# Patient Record
Sex: Male | Born: 1955 | Race: Black or African American | Hispanic: No | Marital: Single | Smoking: Never smoker
Health system: Southern US, Community
[De-identification: ages and names within clinical notes are randomized; demographics above are authoritative.]

## PROBLEM LIST (undated history)

## (undated) DIAGNOSIS — J45909 Unspecified asthma, uncomplicated: Secondary | ICD-10-CM

## (undated) DIAGNOSIS — Z9119 Patient's noncompliance with other medical treatment and regimen: Secondary | ICD-10-CM

## (undated) DIAGNOSIS — I2510 Atherosclerotic heart disease of native coronary artery without angina pectoris: Secondary | ICD-10-CM

## (undated) DIAGNOSIS — I219 Acute myocardial infarction, unspecified: Secondary | ICD-10-CM

## (undated) DIAGNOSIS — C61 Malignant neoplasm of prostate: Secondary | ICD-10-CM

## (undated) DIAGNOSIS — I509 Heart failure, unspecified: Secondary | ICD-10-CM

## (undated) DIAGNOSIS — I428 Other cardiomyopathies: Secondary | ICD-10-CM

## (undated) DIAGNOSIS — I493 Ventricular premature depolarization: Secondary | ICD-10-CM

## (undated) DIAGNOSIS — I10 Essential (primary) hypertension: Secondary | ICD-10-CM

## (undated) DIAGNOSIS — E119 Type 2 diabetes mellitus without complications: Secondary | ICD-10-CM

## (undated) DIAGNOSIS — Z91199 Patient's noncompliance with other medical treatment and regimen due to unspecified reason: Secondary | ICD-10-CM

## (undated) DIAGNOSIS — E7800 Pure hypercholesterolemia, unspecified: Secondary | ICD-10-CM

## (undated) HISTORY — DX: Atherosclerotic heart disease of native coronary artery without angina pectoris: I25.10

## (undated) HISTORY — DX: Patient's noncompliance with other medical treatment and regimen due to unspecified reason: Z91.199

## (undated) HISTORY — PX: PROSTATE SURGERY: SHX751

## (undated) HISTORY — DX: Patient's noncompliance with other medical treatment and regimen: Z91.19

## (undated) HISTORY — DX: Heart failure, unspecified: I50.9

## (undated) HISTORY — DX: Other cardiomyopathies: I42.8

## (undated) HISTORY — DX: Pure hypercholesterolemia, unspecified: E78.00

## (undated) HISTORY — DX: Type 2 diabetes mellitus without complications: E11.9

## (undated) HISTORY — DX: Essential (primary) hypertension: I10

## (undated) HISTORY — PX: SURGERY SCROTAL / TESTICULAR: SUR1316

---

## 1997-06-16 ENCOUNTER — Encounter: Admission: RE | Admit: 1997-06-16 | Discharge: 1997-06-16 | Payer: Self-pay | Admitting: Internal Medicine

## 1999-04-30 ENCOUNTER — Encounter: Admission: RE | Admit: 1999-04-30 | Discharge: 1999-04-30 | Payer: Self-pay | Admitting: Internal Medicine

## 2000-07-14 ENCOUNTER — Encounter: Admission: RE | Admit: 2000-07-14 | Discharge: 2000-07-14 | Payer: Self-pay | Admitting: Internal Medicine

## 2000-07-16 ENCOUNTER — Encounter: Admission: RE | Admit: 2000-07-16 | Discharge: 2000-07-16 | Payer: Self-pay | Admitting: Internal Medicine

## 2000-07-20 ENCOUNTER — Encounter: Admission: RE | Admit: 2000-07-20 | Discharge: 2000-07-20 | Payer: Self-pay | Admitting: Internal Medicine

## 2000-07-21 ENCOUNTER — Encounter: Admission: RE | Admit: 2000-07-21 | Discharge: 2000-07-21 | Payer: Self-pay | Admitting: Internal Medicine

## 2000-08-23 ENCOUNTER — Observation Stay (HOSPITAL_COMMUNITY): Admission: EM | Admit: 2000-08-23 | Discharge: 2000-08-24 | Payer: Self-pay | Admitting: Emergency Medicine

## 2000-09-11 ENCOUNTER — Emergency Department (HOSPITAL_COMMUNITY): Admission: EM | Admit: 2000-09-11 | Discharge: 2000-09-11 | Payer: Self-pay | Admitting: Emergency Medicine

## 2000-09-11 ENCOUNTER — Encounter: Payer: Self-pay | Admitting: Emergency Medicine

## 2001-02-04 ENCOUNTER — Emergency Department (HOSPITAL_COMMUNITY): Admission: EM | Admit: 2001-02-04 | Discharge: 2001-02-04 | Payer: Self-pay | Admitting: Emergency Medicine

## 2001-07-05 ENCOUNTER — Emergency Department (HOSPITAL_COMMUNITY): Admission: EM | Admit: 2001-07-05 | Discharge: 2001-07-05 | Payer: Self-pay | Admitting: Emergency Medicine

## 2001-07-07 ENCOUNTER — Encounter: Admission: RE | Admit: 2001-07-07 | Discharge: 2001-07-07 | Payer: Self-pay | Admitting: Internal Medicine

## 2002-01-27 HISTORY — PX: CARDIAC CATHETERIZATION: SHX172

## 2002-08-26 ENCOUNTER — Encounter: Admission: RE | Admit: 2002-08-26 | Discharge: 2002-08-26 | Payer: Self-pay | Admitting: Internal Medicine

## 2002-08-31 ENCOUNTER — Encounter: Admission: RE | Admit: 2002-08-31 | Discharge: 2002-08-31 | Payer: Self-pay | Admitting: Internal Medicine

## 2002-09-01 ENCOUNTER — Encounter: Admission: RE | Admit: 2002-09-01 | Discharge: 2002-09-01 | Payer: Self-pay | Admitting: Internal Medicine

## 2003-01-13 ENCOUNTER — Inpatient Hospital Stay (HOSPITAL_BASED_OUTPATIENT_CLINIC_OR_DEPARTMENT_OTHER): Admission: RE | Admit: 2003-01-13 | Discharge: 2003-01-13 | Payer: Self-pay | Admitting: Cardiology

## 2003-08-14 ENCOUNTER — Emergency Department (HOSPITAL_COMMUNITY): Admission: EM | Admit: 2003-08-14 | Discharge: 2003-08-14 | Payer: Self-pay | Admitting: Emergency Medicine

## 2003-08-17 ENCOUNTER — Emergency Department (HOSPITAL_COMMUNITY): Admission: EM | Admit: 2003-08-17 | Discharge: 2003-08-17 | Payer: Self-pay | Admitting: Emergency Medicine

## 2004-01-10 ENCOUNTER — Emergency Department (HOSPITAL_COMMUNITY): Admission: EM | Admit: 2004-01-10 | Discharge: 2004-01-10 | Payer: Self-pay | Admitting: Emergency Medicine

## 2004-05-14 ENCOUNTER — Emergency Department (HOSPITAL_COMMUNITY): Admission: EM | Admit: 2004-05-14 | Discharge: 2004-05-14 | Payer: Self-pay | Admitting: *Deleted

## 2004-09-23 ENCOUNTER — Emergency Department (HOSPITAL_COMMUNITY): Admission: EM | Admit: 2004-09-23 | Discharge: 2004-09-23 | Payer: Self-pay | Admitting: Emergency Medicine

## 2004-11-05 ENCOUNTER — Ambulatory Visit: Payer: Self-pay

## 2004-11-20 ENCOUNTER — Ambulatory Visit: Payer: Self-pay | Admitting: Cardiology

## 2004-12-04 ENCOUNTER — Ambulatory Visit: Payer: Self-pay | Admitting: Cardiology

## 2005-01-13 ENCOUNTER — Ambulatory Visit: Payer: Self-pay | Admitting: Cardiology

## 2005-07-23 ENCOUNTER — Emergency Department (HOSPITAL_COMMUNITY): Admission: EM | Admit: 2005-07-23 | Discharge: 2005-07-23 | Payer: Self-pay | Admitting: Emergency Medicine

## 2005-08-26 ENCOUNTER — Ambulatory Visit: Payer: Self-pay | Admitting: Cardiology

## 2005-09-10 ENCOUNTER — Ambulatory Visit: Payer: Self-pay | Admitting: Cardiology

## 2005-10-22 ENCOUNTER — Ambulatory Visit: Payer: Self-pay | Admitting: Cardiology

## 2005-11-28 ENCOUNTER — Ambulatory Visit: Payer: Self-pay | Admitting: Cardiology

## 2006-01-01 ENCOUNTER — Ambulatory Visit: Payer: Self-pay | Admitting: Cardiology

## 2006-02-12 ENCOUNTER — Ambulatory Visit: Payer: Self-pay | Admitting: Cardiology

## 2006-02-24 ENCOUNTER — Ambulatory Visit: Payer: Self-pay | Admitting: Cardiology

## 2006-02-24 LAB — CONVERTED CEMR LAB
BUN: 13 mg/dL (ref 6–23)
CO2: 27 meq/L (ref 19–32)
Calcium: 9.2 mg/dL (ref 8.4–10.5)
Chloride: 109 meq/L (ref 96–112)
Creatinine, Ser: 1.5 mg/dL (ref 0.4–1.5)
GFR calc Af Amer: 63 mL/min
GFR calc non Af Amer: 52 mL/min
Glucose, Bld: 110 mg/dL — ABNORMAL HIGH (ref 70–99)
Potassium: 3.7 meq/L (ref 3.5–5.1)
Sodium: 140 meq/L (ref 135–145)

## 2006-03-16 ENCOUNTER — Ambulatory Visit: Payer: Self-pay | Admitting: Cardiology

## 2006-03-16 LAB — CONVERTED CEMR LAB
BUN: 13 mg/dL (ref 6–23)
CO2: 26 meq/L (ref 19–32)
Calcium: 9.3 mg/dL (ref 8.4–10.5)
Chloride: 108 meq/L (ref 96–112)
Creatinine, Ser: 1.4 mg/dL (ref 0.4–1.5)
GFR calc Af Amer: 69 mL/min
GFR calc non Af Amer: 57 mL/min
Glucose, Bld: 87 mg/dL (ref 70–99)
Potassium: 3.8 meq/L (ref 3.5–5.1)
Sodium: 140 meq/L (ref 135–145)

## 2006-08-24 ENCOUNTER — Ambulatory Visit: Payer: Self-pay | Admitting: Cardiology

## 2006-08-24 LAB — CONVERTED CEMR LAB
BUN: 18 mg/dL (ref 6–23)
CO2: 26 meq/L (ref 19–32)
Calcium: 9.4 mg/dL (ref 8.4–10.5)
Chloride: 106 meq/L (ref 96–112)
Creatinine, Ser: 1.6 mg/dL — ABNORMAL HIGH (ref 0.4–1.5)
GFR calc Af Amer: 59 mL/min
GFR calc non Af Amer: 48 mL/min
Glucose, Bld: 145 mg/dL — ABNORMAL HIGH (ref 70–99)
Potassium: 3.7 meq/L (ref 3.5–5.1)
Sodium: 141 meq/L (ref 135–145)

## 2006-09-24 ENCOUNTER — Ambulatory Visit: Payer: Self-pay | Admitting: Cardiology

## 2006-09-24 LAB — CONVERTED CEMR LAB
BUN: 15 mg/dL (ref 6–23)
CO2: 29 meq/L (ref 19–32)
Calcium: 9.7 mg/dL (ref 8.4–10.5)
Chloride: 108 meq/L (ref 96–112)
Creatinine, Ser: 1.5 mg/dL (ref 0.4–1.5)
GFR calc Af Amer: 63 mL/min
GFR calc non Af Amer: 52 mL/min
Glucose, Bld: 94 mg/dL (ref 70–99)
Potassium: 4 meq/L (ref 3.5–5.1)
Sodium: 143 meq/L (ref 135–145)

## 2007-09-20 ENCOUNTER — Inpatient Hospital Stay (HOSPITAL_COMMUNITY): Admission: EM | Admit: 2007-09-20 | Discharge: 2007-09-21 | Payer: Self-pay | Admitting: Emergency Medicine

## 2007-09-20 LAB — CONVERTED CEMR LAB
Cholesterol: 169 mg/dL
HDL: 25 mg/dL
Hgb A1c MFr Bld: 10.4 %
LDL Cholesterol: 104 mg/dL
TSH: 1.869 microintl units/mL
Triglyceride fasting, serum: 200 mg/dL

## 2007-09-28 ENCOUNTER — Ambulatory Visit: Payer: Self-pay | Admitting: Cardiology

## 2007-10-12 ENCOUNTER — Ambulatory Visit: Payer: Self-pay

## 2007-10-29 ENCOUNTER — Encounter: Payer: Self-pay | Admitting: Cardiology

## 2007-10-29 ENCOUNTER — Ambulatory Visit: Payer: Self-pay

## 2007-11-01 ENCOUNTER — Encounter: Admission: RE | Admit: 2007-11-01 | Discharge: 2007-11-01 | Payer: Self-pay | Admitting: Internal Medicine

## 2007-12-10 ENCOUNTER — Emergency Department (HOSPITAL_COMMUNITY): Admission: EM | Admit: 2007-12-10 | Discharge: 2007-12-10 | Payer: Self-pay | Admitting: Emergency Medicine

## 2008-03-31 ENCOUNTER — Ambulatory Visit: Payer: Self-pay | Admitting: Cardiology

## 2008-03-31 LAB — CONVERTED CEMR LAB
BUN: 20 mg/dL (ref 6–23)
CO2: 30 meq/L (ref 19–32)
Calcium: 9.5 mg/dL (ref 8.4–10.5)
Chloride: 106 meq/L (ref 96–112)
Creatinine, Ser: 1.3 mg/dL (ref 0.4–1.5)
GFR calc Af Amer: 74 mL/min
GFR calc non Af Amer: 61 mL/min
Glucose, Bld: 110 mg/dL — ABNORMAL HIGH (ref 70–99)
Potassium: 3.9 meq/L (ref 3.5–5.1)
Sodium: 141 meq/L (ref 135–145)

## 2009-02-02 ENCOUNTER — Encounter (INDEPENDENT_AMBULATORY_CARE_PROVIDER_SITE_OTHER): Payer: Self-pay | Admitting: *Deleted

## 2009-04-20 DIAGNOSIS — I10 Essential (primary) hypertension: Secondary | ICD-10-CM | POA: Insufficient documentation

## 2009-04-25 ENCOUNTER — Ambulatory Visit: Payer: Self-pay | Admitting: Internal Medicine

## 2009-04-25 ENCOUNTER — Ambulatory Visit: Payer: Self-pay | Admitting: Cardiology

## 2009-04-25 DIAGNOSIS — E1165 Type 2 diabetes mellitus with hyperglycemia: Secondary | ICD-10-CM | POA: Insufficient documentation

## 2009-04-25 DIAGNOSIS — E119 Type 2 diabetes mellitus without complications: Secondary | ICD-10-CM | POA: Insufficient documentation

## 2009-04-25 DIAGNOSIS — I428 Other cardiomyopathies: Secondary | ICD-10-CM | POA: Insufficient documentation

## 2009-04-25 LAB — CONVERTED CEMR LAB: Blood Glucose, AC Bkfst: 124 mg/dL

## 2009-04-25 LAB — HM DIABETES FOOT EXAM

## 2009-04-26 ENCOUNTER — Encounter: Payer: Self-pay | Admitting: Internal Medicine

## 2009-04-26 ENCOUNTER — Encounter (INDEPENDENT_AMBULATORY_CARE_PROVIDER_SITE_OTHER): Payer: Self-pay | Admitting: *Deleted

## 2009-04-26 ENCOUNTER — Telehealth: Payer: Self-pay | Admitting: Cardiology

## 2009-04-26 DIAGNOSIS — R972 Elevated prostate specific antigen [PSA]: Secondary | ICD-10-CM | POA: Insufficient documentation

## 2009-05-02 ENCOUNTER — Inpatient Hospital Stay (HOSPITAL_COMMUNITY): Admission: EM | Admit: 2009-05-02 | Discharge: 2009-05-03 | Payer: Self-pay | Admitting: Emergency Medicine

## 2009-05-02 ENCOUNTER — Ambulatory Visit: Payer: Self-pay | Admitting: Internal Medicine

## 2009-05-04 ENCOUNTER — Encounter (INDEPENDENT_AMBULATORY_CARE_PROVIDER_SITE_OTHER): Payer: Self-pay | Admitting: *Deleted

## 2009-05-08 ENCOUNTER — Telehealth (INDEPENDENT_AMBULATORY_CARE_PROVIDER_SITE_OTHER): Payer: Self-pay | Admitting: *Deleted

## 2009-05-09 ENCOUNTER — Ambulatory Visit: Payer: Self-pay

## 2009-05-09 ENCOUNTER — Ambulatory Visit: Payer: Self-pay | Admitting: Cardiovascular Disease

## 2009-05-09 ENCOUNTER — Ambulatory Visit: Payer: Self-pay | Admitting: Gastroenterology

## 2009-05-09 ENCOUNTER — Encounter (HOSPITAL_COMMUNITY): Admission: RE | Admit: 2009-05-09 | Discharge: 2009-06-27 | Payer: Self-pay | Admitting: Cardiology

## 2009-05-09 ENCOUNTER — Encounter (INDEPENDENT_AMBULATORY_CARE_PROVIDER_SITE_OTHER): Payer: Self-pay | Admitting: *Deleted

## 2009-05-23 ENCOUNTER — Ambulatory Visit: Payer: Self-pay | Admitting: Gastroenterology

## 2009-05-23 LAB — HM COLONOSCOPY

## 2009-05-25 ENCOUNTER — Encounter: Payer: Self-pay | Admitting: Gastroenterology

## 2009-06-01 ENCOUNTER — Encounter: Payer: Self-pay | Admitting: Cardiology

## 2009-07-25 ENCOUNTER — Ambulatory Visit: Payer: Self-pay | Admitting: Internal Medicine

## 2009-09-09 ENCOUNTER — Emergency Department (HOSPITAL_COMMUNITY): Admission: EM | Admit: 2009-09-09 | Discharge: 2009-09-09 | Payer: Self-pay | Admitting: Emergency Medicine

## 2009-09-09 ENCOUNTER — Encounter: Payer: Self-pay | Admitting: Cardiology

## 2009-09-10 ENCOUNTER — Telehealth: Payer: Self-pay | Admitting: Cardiology

## 2009-09-14 ENCOUNTER — Encounter: Payer: Self-pay | Admitting: Cardiology

## 2009-09-17 ENCOUNTER — Ambulatory Visit: Payer: Self-pay | Admitting: Cardiology

## 2009-09-17 DIAGNOSIS — I4949 Other premature depolarization: Secondary | ICD-10-CM | POA: Insufficient documentation

## 2009-09-17 DIAGNOSIS — E7800 Pure hypercholesterolemia, unspecified: Secondary | ICD-10-CM | POA: Insufficient documentation

## 2009-09-27 ENCOUNTER — Encounter: Payer: Self-pay | Admitting: Cardiology

## 2010-02-24 LAB — CONVERTED CEMR LAB
ALT: 26 units/L (ref 0–53)
AST: 24 units/L (ref 0–37)
Albumin: 3.9 g/dL (ref 3.5–5.2)
Alkaline Phosphatase: 65 units/L (ref 39–117)
BUN: 19 mg/dL (ref 6–23)
Basophils Absolute: 0.1 10*3/uL (ref 0.0–0.1)
Basophils Relative: 0.9 % (ref 0.0–3.0)
Bilirubin, Direct: 0 mg/dL (ref 0.0–0.3)
CO2: 27 meq/L (ref 19–32)
Calcium: 9.8 mg/dL (ref 8.4–10.5)
Chloride: 106 meq/L (ref 96–112)
Cholesterol: 210 mg/dL — ABNORMAL HIGH (ref 0–200)
Creatinine, Ser: 1.4 mg/dL (ref 0.4–1.5)
Direct LDL: 148.8 mg/dL
Eosinophils Absolute: 0.1 10*3/uL (ref 0.0–0.7)
Eosinophils Relative: 1.6 % (ref 0.0–5.0)
GFR calc non Af Amer: 67.67 mL/min (ref >60)
Glucose, Bld: 145 mg/dL — ABNORMAL HIGH (ref 70–99)
HCT: 42.4 % (ref 39.0–52.0)
HDL: 44.9 mg/dL (ref >39.00)
Hemoglobin: 13.9 g/dL (ref 13.0–17.0)
Hgb A1c MFr Bld: 7.5 % — ABNORMAL HIGH (ref 4.6–6.5)
Lymphocytes Relative: 33 % (ref 12.0–46.0)
Lymphs Abs: 2.4 10*3/uL (ref 0.7–4.0)
MCHC: 32.7 g/dL (ref 30.0–36.0)
MCV: 91.9 fL (ref 78.0–100.0)
Monocytes Absolute: 0.6 10*3/uL (ref 0.1–1.0)
Monocytes Relative: 8.1 % (ref 3.0–12.0)
Neutro Abs: 4.2 10*3/uL (ref 1.4–7.7)
Neutrophils Relative %: 56.4 % (ref 43.0–77.0)
PSA: 7.8 ng/mL — ABNORMAL HIGH (ref 0.10–4.00)
Platelets: 286 10*3/uL (ref 150.0–400.0)
Potassium: 4 meq/L (ref 3.5–5.1)
RBC: 4.61 M/uL (ref 4.22–5.81)
RDW: 12.6 % (ref 11.5–14.6)
Sodium: 141 meq/L (ref 135–145)
TSH: 0.82 microintl units/mL (ref 0.35–5.50)
Total Bilirubin: 0.5 mg/dL (ref 0.3–1.2)
Total CHOL/HDL Ratio: 5
Total Protein: 8 g/dL (ref 6.0–8.3)
Triglycerides: 91 mg/dL (ref 0.0–149.0)
VLDL: 18.2 mg/dL (ref 0.0–40.0)
WBC: 7.4 10*3/uL (ref 4.5–10.5)

## 2010-02-28 NOTE — Letter (Signed)
Summary: Patient Notice- Polyp Results  [facility]  [address] [place], [place] [address]   Phone: [phone]  Fax: [phone]        May 25, 2009 MRN: [id number]    IREN WHIPP [address] [place], [place]  [address]    Dear Mr. PRESLAR,  I am pleased to inform you that the colon polyp(s) removed during your recent colonoscopy was (were) found to be benign (no cancer detected) upon pathologic examination.  I recommend you have a repeat colonoscopy examination in 10_ years to look for recurrent polyps, as having colon polyps increases your risk for having recurrent polyps or even colon cancer in the future.  Should you develop new or worsening symptoms of abdominal pain, bowel habit changes or bleeding from the rectum or bowels, please schedule an evaluation with either your primary care physician or with me.  Additional information/recommendations:  xx__ No further action with gastroenterology is needed at this time. Please      follow-up with your primary care physician for your other healthcare      needs.  __ Please call [phone] to schedule a return visit to review your      situation.  __ Please keep your follow-up visit as already scheduled.  __ Continue treatment plan as outlined the day of your exam.  Please call us if you are having persistent problems or have questions about your condition that have not been fully answered at this time.  Sincerely,  Mardella Layman MD [facility]  This letter has been electronically signed by your physician.  Appended Document: Patient Notice- Polyp Results letter mailed 5.2.11

## 2010-02-28 NOTE — Miscellaneous (Signed)
Summary: abn PSA   Clinical Lists Changes  Problems: Added new problem of PSA, INCREASED (ICD-790.93) - Signed Orders: Added new Referral order of Urology Referral (Urology) - Signed  Valentina Gu, please let pt know that because of the abnormal PSA lab and +FHx prostate cancer, will refer to urology for further evaluation - [facility] will contact him once arranged - thanks! Newt Lukes MD  April 26, 2009 10:36 AM     called pt no ansew [facility] concerning labs.Marland KitchenMarland Kitchen3/31/11@10 :38am/LMB pt informed via secure VM per pt request. Margaret Pyle, CMA  April 27, 2009 9:40 AM

## 2010-02-28 NOTE — Miscellaneous (Signed)
  Clinical Lists Changes  Observations: Added new observation of NUCLEAR NOS: Exercise Capacity: Fair exercise capacity. BP Response: Normal blood pressure response. Clinical Symptoms: fatigue ECG Impression: Decrease in ambiant PVC;s with exercise Overall Impression: Small area of anterobasal ischemia.  EF disproportionately low compared to defect more suggestive of non-ischemic DCM   (05/09/2009 15:31)      Nuclear Study  Procedure date:  05/09/2009  Findings:      Exercise Capacity: Fair exercise capacity. BP Response: Normal blood pressure response. Clinical Symptoms: fatigue ECG Impression: Decrease in ambiant PVC;s with exercise Overall Impression: Small area of anterobasal ischemia.  EF disproportionately low compared to defect more suggestive of non-ischemic DCM

## 2010-02-28 NOTE — Procedures (Signed)
Summary: Colonoscopy  Patient: [name] Note: All result statuses are Final unless otherwise noted.  Tests: (1) Colonoscopy (COL)   COL Colonoscopy           DONE     [facility]     [address]     [place], [place]  [address]           COLONOSCOPY PROCEDURE REPORT           PATIENT:  [name], Human  MR#:  [id number]     BIRTHDATE:  [birth year], 54 yrs. old  GENDER:  male     ENDOSCOPIST:  Vania Rea. Jarold Motto, MD, [facility]     REF. BY:  Rene Paci, M.D.     PROCEDURE DATE:  05/23/2009     PROCEDURE:  Colonoscopy with snare polypectomy     ASA CLASS:  Class II     INDICATIONS:  Routine Risk Screening     MEDICATIONS:   Fentanyl 75 mcg IV, Versed 7 mg IV           DESCRIPTION OF PROCEDURE:   After the risks benefits and     alternatives of the procedure were thoroughly explained, informed     consent was obtained.  Digital rectal exam was performed and     revealed no abnormalities.   The LB [id number] endoscope     was introduced through the anus and advanced to the cecum, which     was identified by both the appendix and ileocecal valve, without     limitations.  The quality of the prep was excellent, using     MoviPrep.  The instrument was then slowly withdrawn as the colon     was fully examined.     <<PROCEDUREIMAGES>>           FINDINGS:  A diminutive polyp was found in the rectum.  This was     otherwise a normal examination of the colon.   Retroflexed views     in the rectum revealed no abnormalities.    The scope was then     withdrawn from the patient and the procedure completed.           COMPLICATIONS:  None     ENDOSCOPIC IMPRESSION:     1) Diminutive polyp in the rectum     2) Otherwise normal examination     R/O ADENOMA.     RECOMMENDATIONS:     1) Repeat colonoscopy in 5 years if polyp adenomatous; otherwise     10 years     REPEAT EXAM:  No           ______________________________     Vania Rea. Jarold Motto, MD, Clementeen Graham        CC:           n.     eSIGNED:   Vania Rea. Patterson at 05/23/2009 11:45 AM           Earlean Shawl, [id number]  Note: An exclamation mark (!) indicates a result that was not dispersed into the flowsheet. Document Creation Date: 05/23/2009 11:46 AM _______________________________________________________________________  (1) Order result status: Final Collection or observation date-time: 05/23/2009 11:29 Requested date-time:  Receipt date-time:  Reported date-time:  Referring Physician:   Ordering Physician: Sheryn Bison [phone]) Specimen Source:  Source: Launa Grill Order Number: [id number] Lab site:   Appended Document: Colonoscopy     Procedures Next Due Date:    Colonoscopy: 04/2019

## 2010-02-28 NOTE — Assessment & Plan Note (Signed)
Summary: Cardiology Nuclear Study  Nuclear Med Background Indications for Stress Test: Evaluation for Ischemia, Post Hospital  Indications Comments: 4/6-05/03/09 Chest Pain, (-) enzymes  History: Abnormal EKG, Asthma, COPD, Echo, Heart Catheterization, Myocardial Perfusion Study  History Comments: '05 Cath:n/o CAD; '09 Echo:EF=45%; 9/09 EAV:WUJWJ area of apical ischemia, not gated.  Symptoms: Chest Pain, DOE, Fatigue, Rapid HR  Symptoms Comments: Last episode of XB:JYNW week.   Nuclear Pre-Procedure Cardiac Risk Factors: Hypertension, NIDDM, Obesity Caffeine/Decaff Intake: None NPO After: 5:30 PM Lungs: Clear IV 0.9% NS with Angio Cath: 20g     IV Site: (R) AC IV Started by: Irean Hong RN Chest Size (in) 42     Height (in): 65 Weight (lb): 191 BMI: 31.90  Nuclear Med Study 1 or 2 day study:  1 day     Stress Test Type:  Stress Reading MD:  Charlton Haws, MD     Referring MD:  Rollene Rotunda, MD Resting Radionuclide:  Technetium 17m Tetrofosmin     Resting Radionuclide Dose:  11 mCi  Stress Radionuclide:  Technetium 75m Tetrofosmin     Stress Radionuclide Dose:  33 mCi   Stress Protocol Exercise Time (min):  8:45 min     Max HR:  142 bpm     Predicted Max HR:  165 bpm  Max Systolic BP: 168 mm Hg     Percent Max HR:  86.06 %     METS: 10.4 Rate Pressure Product:  29562    Stress Test Technologist:  Rea [employer] CMA-N     Nuclear Technologist:  Domenic Polite CNMT  Rest Procedure  Myocardial perfusion imaging was performed at rest 45 minutes following the intravenous administration of Myoview Technetium 80m Tetrofosmin.  Stress Procedure  The patient exercised for 8:45.  The patient stopped due to fatigue and denied any chest pain.  There were no diagnostic ST-T wave changes; there were frequent PVC's with bigeminy and trigeminy of which the patient was asymptomatic.  Myoview was injected at peak exercise and myocardial perfusion imaging was performed after a brief  delay.  QPS Raw Data Images:  Normal; no motion artifact; normal heart/lung ratio. Stress Images:  NI: Uniform and normal uptake of tracer in all myocardial segments. Rest Images:  Normal homogeneous uptake in all areas of the myocardium. Subtraction (SDS):  SDS 4 Transient Ischemic Dilatation:  1.03  (Normal <1.22)  Lung/Heart Ratio:  .31  (Normal <0.45)  Quantitative Gated Spect Images QGS EDV:  155 ml QGS ESV:  100 ml QGS EF:  36 % QGS cine images:  Diffuse hypokinesis  Findings Low risk nuclear study Evidence for anterior (septal apical) ischemia   Evidence for LV Dysfunction LV Dysfunction    Overall Impression  Exercise Capacity: Fair exercise capacity. BP Response: Normal blood pressure response. Clinical Symptoms: fatigue ECG Impression: Decrease in ambiant PVC;s with exercise Overall Impression: Small area of anterobasal ischemia.  EF disproportionately low compared to defect more suggestive of non-ischemic DCM  Appended Document: Cardiology Nuclear Study Low risk study.  Nonischemic CM  Appended Document: Cardiology Nuclear Study left message of results on voicemail

## 2010-02-28 NOTE — Assessment & Plan Note (Signed)
Summary: NEW PT/ BCBS/ DM/ NOT ON MEDICINE/DR HOCHREIN/NWS   Vital Signs:  Patient profile:   55 year old male Height:      65 inches (165.10 cm) Weight:      196.4 pounds (89.27 kg) O2 Sat:      97 % on Room air Temp:     98.2 degrees F (36.78 degrees C) oral Pulse rate:   38 / minute BP sitting:   148 / 70  (left arm) Cuff size:   large  Vitals Entered By: Orlan Leavens (April 25, 2009 10:48 AM)  O2 Flow:  Room air CC: New patient Is Patient Diabetic? No Pain Assessment Patient in pain? no        Primary Care Provider:  Newt Lukes MD  CC:  New patient.  History of Present Illness: new pt to me and our division, here to est care prev followed with dr. Mikeal Hawthorne - but has not taken any meds for last 6 mos -  1) DM2 - prev on pills and insulin - but no meds x 6 mos - denies abd pain, N/V or weight changes - denies polyuria or thirst - no blurred vision  2) HTN - as above, no meds >6 months - denies HA or CP - no feet swelling or vision changes -   3) CM with hx CHF - follows with cards for same (just seen this AM prompting this appt) - no edema, CP,SOB - follows no salt diet very carefully but noncompliant with med rx as above  Clinical Review Panels:  Immunizations   Last Tetanus Booster:  Tdap (04/25/2009)   Last Flu Vaccine:  Historical (10/27/2008)  Lipid Management   Cholesterol:  169 (09/20/2007)   LDL (bad choesterol):  104 (09/20/2007)   HDL (good cholesterol):  25 (09/20/2007)   Triglycerides:  200 (09/20/2007)  Diabetes Management   HgBA1C:  10.4 (09/20/2007)   Creatinine:  1.3 (03/31/2008)   Last Foot Exam:  yes (04/25/2009)   Last Flu Vaccine:  Historical (10/27/2008)  Complete Metabolic Panel   Glucose:  110 (03/31/2008)   Sodium:  141 (03/31/2008)   Potassium:  3.9 (03/31/2008)   Chloride:  106 (03/31/2008)   CO2:  30 (03/31/2008)   BUN:  20 (03/31/2008)   Creatinine:  1.3 (03/31/2008)   Calcium:  9.5 (03/31/2008)   -  Date:   09/20/2007    HgbA1c: 10.4    Cholesterol: 169    LDL: 104    HDL: 25    Triglycerides: 811    TSH: 1.869  Current Medications (verified): 1)  Spironolactone 25 Mg Tabs (Spironolactone) .... 2 By Mouth Daily 2)  Hydrochlorothiazide 12.5 Mg Caps (Hydrochlorothiazide) .[name].. 1 By Mouth Daiy 3)  Aspirin 81 Mg  Tabs (Aspirin) .[name].. 1 By Mouth Daily 4)  Benazepril Hcl 40 Mg Tabs (Benazepril Hcl) .... One Daily 5)  Amlodipine Besylate 5 Mg Tabs (Amlodipine Besylate) .... One Daily  Allergies (verified): No Known Drug Allergies  Past History:  Past Medical History: Hypertension.  Cardiomyopathy (EF approximately 45%).  Nonobstructive coronary artery disease (first diagonal 25%       stenosis, ramus intermedius 25% stenosis, this was on       catheterization in 2004).  DM2    MD rooster: cards - hochrein  Past Surgical History: Cardiac catheterization, which was normal per  the patient.      Family History: mom expired age 67y due to PVD (AKA), HTN brother - prostate ca  Social  History:  The patient denies smoking  rare use alcohol -wine 2-3x/yr widowed, then married -but separated now 23yo son on HD - to move in with pt    Review of Systems  The patient denies anorexia, fever, weight gain, vision loss, decreased hearing, hoarseness, chest pain, syncope, dyspnea on exertion, peripheral edema, headaches, abdominal pain, severe indigestion/heartburn, incontinence, muscle weakness, and depression.    Physical Exam  General:  Well developed, well nourished, in no acute distress. Neck:  supple, full ROM, no masses, no thyromegaly; no thyroid nodules or tenderness. no JVD or carotid bruits.   Lungs:  normal respiratory effort, no intercostal retractions or use of accessory muscles; normal breath sounds bilaterally - no crackles and no wheezes.    Heart:  normal rate, regular rhythm, no murmur, and no rub. BLE without edema.  Psych:  Oriented X3, memory intact for recent and  remote, normally interactive, good eye contact, not anxious appearing, not depressed appearing, and not agitated.     Diabetes Management Exam:    Foot Exam (with socks and/or shoes not present):       Sensory-Pinprick/Light touch:          Left medial foot (L-4): normal          Left dorsal foot (L-5): normal          Left lateral foot (S-1): normal          Right medial foot (L-4): normal          Right dorsal foot (L-5): normal          Right lateral foot (S-1): normal       Sensory-Monofilament:          Left foot: normal          Right foot: normal       Inspection:          Left foot: normal          Right foot: normal       Nails:          Left foot: normal          Right foot: normal   Impression & Recommendations:  Problem # 1:  DIABETES MELLITUS, TYPE II (ICD-250.00)  uncontrolled by hx ([facility] 08/2007) and noncompliant with meds -- but random glc <150 today fasting - await labs as drawn earlier this AM by cards - resume oral meds at reduced dose (compared to rx as per dc summary 08/2007) and hold insulin at this time - prev on lnatus but this med was cost prohibitive - consider 70/30 if needed education on diet and need to check cbgs to monitor tx effects -  f/u 3 mos to review, sooner if probs His updated medication list for this problem includes:    Aspirin 81 Mg Tabs (Aspirin) .[name]... 1 by mouth daily    Benazepril Hcl 40 Mg Tabs (Benazepril hcl) ..... One daily    Metformin Hcl 500 Mg Tabs (Metformin hcl) .[name]... 1 by mouth two times a day    Glimepiride 2 Mg Tabs (Glimepiride) .[name]... 1 by mouth  every morning  Labs Reviewed: Creat: 1.3 (03/31/2008)    Reviewed HgBA1c results: 10.4 (09/20/2007)  Orders: Prescription Created Electronically [id number])  Problem # 2:  HYPERTENSION (ICD-401.9)  His updated medication list for this problem includes:    Spironolactone 25 Mg Tabs (Spironolactone) .[name]... 2 by mouth daily    Hydrochlorothiazide 12.5 Mg Caps  (Hydrochlorothiazide) .[name]... 1 by  mouth daiy    Benazepril Hcl 40 Mg Tabs (Benazepril hcl) ..... One daily    Amlodipine Besylate 5 Mg Tabs (Amlodipine besylate) ..... One daily  BP today: 148/70 Prior BP: 162/98 (04/25/2009)  Labs Reviewed: K+: 3.9 (03/31/2008) Creat: : 1.3 (03/31/2008)   Chol: 169 (09/20/2007)   HDL: 25 (09/20/2007)   LDL: 104 (09/20/2007)   TG: 200 (09/20/2007)  Problem # 3:  CARDIOMYOPATHY (ICD-425.4)  The meds will be as above. I will repeat an echocardiogram in the months to come.  At this point he seems to have class I symptoms.  Problem # 4:  CAD (ICD-414.00)  as per cards earlier today - cont meds -- no symptoms presently His updated medication list for this problem includes:    Spironolactone 25 Mg Tabs (Spironolactone) .[name]... 2 by mouth daily    Hydrochlorothiazide 12.5 Mg Caps (Hydrochlorothiazide) .[name]... 1 by mouth daiy    Aspirin 81 Mg Tabs (Aspirin) .[name]... 1 by mouth daily    Benazepril Hcl 40 Mg Tabs (Benazepril hcl) ..... One daily    Amlodipine Besylate 5 Mg Tabs (Amlodipine besylate) ..... One daily  Labs Reviewed: Chol: 169 (09/20/2007)   HDL: 25 (09/20/2007)   LDL: 104 (09/20/2007)   TG: 200 (09/20/2007)  Complete Medication List: 1)  Spironolactone 25 Mg Tabs (Spironolactone) .... 2 by mouth daily 2)  Hydrochlorothiazide 12.5 Mg Caps (Hydrochlorothiazide) .[name].. 1 by mouth daiy 3)  Aspirin 81 Mg Tabs (Aspirin) .[name].. 1 by mouth daily 4)  Benazepril Hcl 40 Mg Tabs (Benazepril hcl) .... One daily 5)  Amlodipine Besylate 5 Mg Tabs (Amlodipine besylate) .... One daily 6)  Metformin Hcl 500 Mg Tabs (Metformin hcl) .[name].. 1 by mouth two times a day 7)  Glimepiride 2 Mg Tabs (Glimepiride) .[name].. 1 by mouth  every morning  Other Orders: Tdap => 42yrs IM (30160) Admin 1st Vaccine (10932) Glucose, (CBG) (35573) Gastroenterology Referral (GI)  Patient Instructions: 1)  it was good to see you today. 2)  will hold on insulin for now until your labs come back  -  3)  start pills for your diabetes - metformin and glimepride - 30d supply prescriptions have been electronically submitted to your pharmacy at [facility]. Please take as directed. Contact our office if you believe you're having problems with the medication(s).  4)  check you sugars every AM before breakfast and every PM before dinner - call if less than 60 or over 200 5)  reduce your fatty food intake to 2-3x/week and eat smaller portions of this food - avoid foods high in fructose, corn syrup and sugar - handout provided to read about diabetic diet 6)  Please schedule a follow-up appointment in 3 months to review, sooner if problems.  7)  we'll make referral for colonoscopy . Our office will contact you regarding this appointment once made.  Prescriptions: GLIMEPIRIDE 2 MG TABS (GLIMEPIRIDE) 1 by mouth  every morning  #30 x 5   Entered and Authorized by:   Newt Lukes MD   Signed by:   Newt Lukes MD on 04/25/2009   Method used:   Electronically to        [facility] [address] (retail)       [phone]       [place], [place]  [address]       Ph: [phone]       Fax: [phone]   RxID:   [id number] METFORMIN  HCL 500 MG TABS (METFORMIN HCL) 1 by mouth two times a day  #60 x 5   Entered and Authorized by:   Newt Lukes MD   Signed by:   Newt Lukes MD on 04/25/2009   Method used:   Electronically to        [facility] [address] (retail)       [phone]       [place], [place]  [address]       Ph: [phone]       Fax: [phone]   RxID:   [id number]    Immunization History:  Influenza Immunization History:    Influenza:  historical (10/27/2008)  Immunizations Administered:  Tetanus Vaccine:    Vaccine Type: Tdap    Site: left deltoid    Mfr: Gl[employer]oSmith[employer]    Dose: 0.5 ml    Route: IM    Given by: Orlan Leavens    Exp. Date: 11/22/2009    Lot #:  [id number]    VIS given: 04/25/09    Laboratory Results   Blood Tests     CBG Fasting:: 124mg /dL

## 2010-02-28 NOTE — Progress Notes (Signed)
Summary: lab results  Medications Added SIMVASTATIN 40 MG TABS (SIMVASTATIN) Take one tablet by mouth daily at bedtime       Phone Note Call from Patient Call back at Home Phone [phone]   Caller: Patient Reason for Call: Talk to Nurse, Lab or Test Results Summary of Call: request lab results Initial call taken by: Migdalia Dk,  April 26, 2009 4:57 PM  Follow-up for Phone Call        Spoke with pt. aware of  lab results and MD recommendations . An electronic prescription for Simvastatin 40 mg  by mouth daily send to [facility] pharmacy as requested per pt. Pt has an appointment for Fasting lipid panel and LFT  June 10th at [facility] lab, 10 weeks after  starting medication. Pt. aware. Ollen Gross, RN, BSN  April 27, 2009 9:13 AM     New/Updated Medications: SIMVASTATIN 40 MG TABS (SIMVASTATIN) Take one tablet by mouth daily at bedtime Prescriptions: SIMVASTATIN 40 MG TABS (SIMVASTATIN) Take one tablet by mouth daily at bedtime  #30 x 8   Entered by:   Ollen Gross, RN, BSN   Authorized by:   Rollene Rotunda, MD, [facility]   Signed by:   Ollen Gross, RN, BSN on 04/27/2009   Method used:   Electronically to        [facility] [address] (retail)       [phone]       [place], [place]  [address]       Ph: [phone]       Fax: [phone]   RxID:   [id number]

## 2010-02-28 NOTE — Letter (Signed)
Summary: Appointment - Reminder 2  [facility], Main Office  [address]   [place], [place] [address]   Phone: [phone]  Fax: [phone]     February 02, 2009 MRN: [id number]   [name] [address] [place], [place]  [address]   Dear [name],  Our records indicate that it is time to schedule a follow-up appointment. Dr.Hochrein recommended that you follow up with [place] in march,2011. It is very important that we reach you to schedule this appointment. We look forward to participating in your health care needs. Please contact us at the number listed above at your earliest convenience to schedule your appointment.  If you are unable to make an appointment at this time, give [place] a call so we can update our records.     Sincerely,   Glass blower/designer

## 2010-02-28 NOTE — Progress Notes (Signed)
Summary: Nuclear Pre-Procedure  Phone Note Outgoing Call Call back at [facility] Phone [phone]   Call placed by: Stanton Kidney, EMT-P,  May 08, 2009 2:31 PM Action Taken: Phone Call Completed Summary of Call: Left message with information on Myoview Information Sheet (see scanned document for details).     Nuclear Med Background Indications for Stress Test: Evaluation for Ischemia, Post Hospital  Indications Comments: 4/6-05/03/09 Chest Pain, (-) enzymes  History: Asthma, COPD, Echo, Heart Catheterization, Myocardial Perfusion Study  History Comments: '05 Heart Cath: N/O CAD '09 Echo: EF= 45% 9/09 MPS: small area of apical ischemia, not gated.  Symptoms: Chest Pain    Nuclear Pre-Procedure Cardiac Risk Factors: Hypertension, NIDDM, Obesity Height (in): 65

## 2010-02-28 NOTE — Letter (Signed)
Summary: [facility] Instructions  [facility]  [address] [place], [place] [address]   Phone: [phone]  Fax: [phone]       ALLYN BERTONI    08-29-1955    MRN: [id number]        Procedure Day Dorna Bloom:  [facility]  05/23/09     Arrival Time:  10:00AM     Procedure Time:  11:00AM     Location of Procedure:                    _X _  [facility] (4th Floor)                        PREPARATION FOR COLONOSCOPY WITH MOVIPREP   Starting 5 days prior to your procedure 05/18/09 do not eat nuts, seeds, popcorn, corn, beans, peas,  salads, or any raw vegetables.  Do not take any fiber supplements (e.g. Metamucil, Citrucel, and Benefiber).  THE DAY BEFORE YOUR PROCEDURE         DATE: 05/22/09  DAY: TUESDAY  1.  Drink clear liquids the entire day-NO SOLID FOOD  2.  Do not drink anything colored red or purple.  Avoid juices with pulp.  No orange juice.  3.  Drink at least 64 oz. (8 glasses) of fluid/clear liquids during the day to prevent dehydration and help the prep work efficiently.  CLEAR LIQUIDS INCLUDE: Water Jello Ice Popsicles Tea (sugar ok, no milk/cream) Powdered fruit flavored drinks Coffee (sugar ok, no milk/cream) Gatorade Juice: apple, white grape, white cranberry  Lemonade Clear bullion, consomm, broth Carbonated beverages (any kind) Strained chicken noodle soup Hard Candy                             4.  In the morning, mix first dose of MoviPrep solution:    Empty 1 Pouch A and 1 Pouch B into the disposable container    Add lukewarm drinking water to the top line of the container. Mix to dissolve    Refrigerate (mixed solution should be used within 24 hrs)  5.  Begin drinking the prep at 5:00 p.m. The MoviPrep container is divided by 4 marks.   Every 15 minutes drink the solution down to the next mark (approximately 8 oz) until the full liter is complete.   6.  Follow completed prep with 16 oz of clear liquid of your choice  (Nothing red or purple).  Continue to drink clear liquids until bedtime.  7.  Before going to bed, mix second dose of MoviPrep solution:    Empty 1 Pouch A and 1 Pouch B into the disposable container    Add lukewarm drinking water to the top line of the container. Mix to dissolve    Refrigerate  THE DAY OF YOUR PROCEDURE      DATE: 05/23/09  DAY: WEDNESDAY  Beginning at 6:00AM (5 hours before procedure):         1. Every 15 minutes, drink the solution down to the next mark (approx 8 oz) until the full liter is complete.  2. Follow completed prep with 16 oz. of clear liquid of your choice.    3. You may drink clear liquids until 9:00AM (2 HOURS BEFORE PROCEDURE).   MEDICATION INSTRUCTIONS  Unless otherwise instructed, you should take regular prescription medications with a small sip of water   as early as possible the morning  of your procedure.  Diabetic patients - see separate instructions.   Additional medication instructions: Take only your Benazepril and Amlodipine the morning of procedure.         OTHER INSTRUCTIONS  You will need a responsible adult at least 56 years of age to accompany you and drive you home.   This person must remain in the waiting room during your procedure.  Wear loose fitting clothing that is easily removed.  Leave jewelry and other valuables at home.  However, you may wish to bring a book to read or  an iPod/MP3 player to listen to music as you wait for your procedure to start.  Remove all body piercing jewelry and leave at home.  Total time from sign-in until discharge is approximately 2-3 hours.  You should go home directly after your procedure and rest.  You can resume normal activities the  day after your procedure.  The day of your procedure you should not:   Drive   Make legal decisions   Operate machinery   Drink alcohol   Return to work  You will receive specific instructions about eating, activities and medications  before you leave.    The above instructions have been reviewed and explained to me by   _______________________    I fully understand and can verbalize these instructions _____________________________ Date _________

## 2010-02-28 NOTE — Miscellaneous (Signed)
Summary: viagra 50  RX  Clinical Lists Changes  Medications: Added new medication of VIAGRA 50 MG TABS (SILDENAFIL CITRATE) one by mouth one hour prior to sexual activity - Signed Rx of VIAGRA 50 MG TABS (SILDENAFIL CITRATE) one by mouth one hour prior to sexual activity;  #20 x 3;  Signed;  Entered by: Charolotte Capuchin, RN;  Authorized by: Rollene Rotunda, MD, [facility];  Method used: Electronically to [facility] [address] [phone] [place], [place], [place]  [address], Ph: [phone], Fax: [phone]    Prescriptions: VIAGRA 50 MG TABS (SILDENAFIL CITRATE) one by mouth one hour prior to sexual activity  #20 x 3   Entered by:   Charolotte Capuchin, RN   Authorized by:   Rollene Rotunda, MD, [facility]   Signed by:   Charolotte Capuchin, RN on 09/17/2009   Method used:   Electronically to        [facility] [address] (retail)       [phone]       [place], [place]  [address]       Ph: [phone]       Fax: [phone]   RxID:   [id number]

## 2010-02-28 NOTE — Letter (Signed)
Summary: Diabetic Instructions  [facility]  [address] [place], [place] [address]   Phone: [phone]  Fax: [phone]    [name] [birth year] MRN: [id number]   _X  _   ORAL DIABETIC MEDICATION INSTRUCTIONS  The day before your procedure:   Take your diabetic pill as you do normally  The day of your procedure:   Do not take your diabetic pill    We will check your blood sugar levels during the admission process and again in Recovery before discharging you home  ________________________________________________________________________

## 2010-02-28 NOTE — Letter (Signed)
Summary: [facility] [facility] Certification Notification  [employer] [facility] Certification Notification   Imported By: Roderic Ovens 06/14/2009 13:18:19  _____________________________________________________________________  External Attachment:    Type:   Image     Comment:   External Document

## 2010-02-28 NOTE — Letter (Signed)
Summary: [facility], Main Office  [address]   [place], [place] [address]   Phone: [phone]  Fax: [phone]         September 27, 2009    [name]     The above named patient had a medical visit on September 17, 2009  Please take this into consideration when reviewing the time away from work/school.        Sincerely yours,      Sander Nephew, RN for Dr. Rollene Rotunda [facility]

## 2010-02-28 NOTE — Miscellaneous (Signed)
Summary: [facility] Previsit/prep  Clinical Lists Changes  Medications: Added new medication of MOVIPREP 100 GM  SOLR (PEG-KCL-NACL-NASULF-NA ASC-C) As per prep instructions. - Signed Rx of MOVIPREP 100 GM  SOLR (PEG-KCL-NACL-NASULF-NA ASC-C) As per prep instructions.;  #1 x 0;  Signed;  Entered by: Wyona Almas RN;  Authorized by: Mardella Layman MD [facility];  Method used: Electronically to [facility] [address] [phone] [place], [place], [place]  [address], Ph: [phone], Fax: [phone] Observations: Added new observation of NKA: T (05/09/2009 13:54)    Prescriptions: MOVIPREP 100 GM  SOLR (PEG-KCL-NACL-NASULF-NA ASC-C) As per prep instructions.  #1 x 0   Entered by:   Wyona Almas RN   Authorized by:   Mardella Layman MD [facility]   Signed by:   Wyona Almas RN on 05/09/2009   Method used:   Electronically to        [facility] [address] (retail)       [phone]       [place], [place]  [address]       Ph: [phone]       Fax: [phone]   RxID:   [id number]

## 2010-02-28 NOTE — Miscellaneous (Signed)
  Clinical Lists Changes  Observations: Added new observation of NUCLEAR NOS: Findings  Low risk nuclear study Evidence for anterior (septal apical) ischemia   Evidence for LV Dysfunction LV Dysfunction    Overall Impression   Exercise Capacity: Fair exercise capacity. BP Response: Normal blood pressure response. Clinical Symptoms: fatigue ECG Impression: Decrease in ambiant PVC;s with exercise Overall Impression: Small area of anterobasal ischemia.  EF disproportionately low compared to defect more suggestive of non-ischemic DCM  (05/09/2009 14:23)      Nuclear Study  Procedure date:  05/09/2009  Findings:      Findings  Low risk nuclear study Evidence for anterior (septal apical) ischemia   Evidence for LV Dysfunction LV Dysfunction    Overall Impression   Exercise Capacity: Fair exercise capacity. BP Response: Normal blood pressure response. Clinical Symptoms: fatigue ECG Impression: Decrease in ambiant PVC;s with exercise Overall Impression: Small area of anterobasal ischemia.  EF disproportionately low compared to defect more suggestive of non-ischemic DCM

## 2010-02-28 NOTE — Progress Notes (Signed)
Summary: pls advise-after hr vm- hypetention, bigeminy    Phone Note Call from Patient Call back at [facility] Phone [phone]   Caller: Patient Reason for Call: Talk to Nurse Summary of Call: Please Advise, Per after hour voice mail - Matt S.  need appt for Hypertention, bigeminy but asymptomatic. Dr. Antoine Poche is off this week.  Initial call taken by: Lorne Skeens,  September 10, 2009 9:18 AM  Follow-up for Phone Call        schedule the pt to see the pa tomorrow, if willing. if only wants to see Deundra Furber, schedule next available and he will need to see his primary now. looks like seen for hypertension but was out of his meds. Deliah Goody, RN  September 10, 2009 9:53 AM   Additional Follow-up for Phone Call Additional follow up Details #1::        pt has appt 09/17/2009 Additional Follow-up by: Charolotte Capuchin, RN,  September 14, 2009 5:14 PM

## 2010-02-28 NOTE — Assessment & Plan Note (Signed)
Summary: eph/ gd  Medications Added LIPITOR 20 MG TABS (ATORVASTATIN CALCIUM) one daily      Allergies Added: NKDA  Visit Type:  Follow-up Primary Caedence Snowden:  Newt Lukes MD  CC:  Hypertension/cardiomyopathy.  History of Present Illness: The patient presents for followup of the above. Since I last saw him he did have one episode of hypertension and palpitations and called our on-call person to discuss. However, it turns out he had run out of his medications. He says he's otherwise been doing well. He doesn't exercise but he walks a lot at work. With this he does not notice any shortness of breath, PND or orthopnea. He has no chest pressure, neck or arm discomfort. He has no palpitations, presyncope or syncope. He doesn't notice his PVCs in a bigeminal pattern. He doesn't take his blood pressure as routinely as I would like. He has not had any leg edema or swelling elsewhere. Note after the last appointment I did start him on simvastatin. He has not had his followup lipids as he was told to.  Current Medications (verified): 1)  Spironolactone 25 Mg Tabs (Spironolactone) .... 2 By Mouth Daily 2)  Hydrochlorothiazide 12.5 Mg Caps (Hydrochlorothiazide) .[name].. 1 By Mouth Daiy 3)  Aspirin 81 Mg  Tabs (Aspirin) .[name].. 1 By Mouth Daily 4)  Benazepril Hcl 40 Mg Tabs (Benazepril Hcl) .... One Daily 5)  Amlodipine Besylate 5 Mg Tabs (Amlodipine Besylate) .... One Daily 6)  Metformin Hcl 500 Mg Tabs (Metformin Hcl) .[name].. 1 By Mouth Two Times A Day 7)  Glimepiride 2 Mg Tabs (Glimepiride) .[name].. 1 By Mouth  Every Morning 8)  Simvastatin 40 Mg Tabs (Simvastatin) .... Take One Tablet By Mouth Daily At Bedtime  Allergies (verified): No Known Drug Allergies  Past History:  Past Medical History: Hypertension.  Cardiomyopathy (EF approximately 45%).  Nonobstructive coronary artery disease (first diagonal 25%       stenosis, ramus intermedius 25% stenosis, this was on       catheterization in 2004).    DM2    MD roster: cards - hochrein  Review of Systems       As stated in the HPI and negative for all other systems.   Vital Signs:  Patient profile:   55 year old male Height:      65 inches Weight:      189 pounds BMI:     31.56 Pulse rate:   48 / minute Resp:     16 per minute BP sitting:   142 / 82  (right arm)  Vitals Entered By: Marrion Coy, CNA (September 17, 2009 9:26 AM)  Physical Exam  General:  Well developed, well nourished, in no acute distress. Head:  normocephalic and atraumatic Neck:  Neck supple, no JVD. No masses, thyromegaly or abnormal cervical nodes. Chest Wall:  no deformities or breast masses noted Lungs:  Clear bilaterally to auscultation and percussion. Abdomen:  Bowel sounds positive; abdomen soft and non-tender without masses, organomegaly, or hernias noted. No hepatosplenomegaly. Msk:  Back normal, normal gait. Muscle strength and tone normal. Extremities:  No clubbing or cyanosis. Neurologic:  Alert and oriented x 3. Skin:  Intact without lesions or rashes. Cervical Nodes:  no significant adenopathy Psych:  Oriented X3, memory intact for recent and remote, normally interactive, good eye contact, not anxious appearing, not depressed appearing, and not agitated.      Detailed Cardiovascular Exam  Neck    Carotids: Carotids full and equal bilaterally without bruits.  Neck Veins: Normal, no JVD.    Heart    Inspection: no deformities or lifts noted.      Palpation: normal PMI with no thrills palpable.      Auscultation: regular rate and rhythm, S1, S2 soft systolic murmur radiating slightly at the aortic outflow tract, rubs, gallops, or clicks.    Vascular    Abdominal Aorta: no palpable masses, pulsations, or audible bruits.      Femoral Pulses: normal femoral pulses bilaterally.      Pedal Pulses: normal pedal pulses bilaterally.      Radial Pulses: normal radial pulses bilaterally.      Peripheral Circulation: no clubbing,  cyanosis, or edema noted with normal capillary refill.     EKG  Procedure date:  09/09/2009  Findings:      Sinus rhythm, left axis deviation, interventricular conduction delay, left ventricular hypertrophy by voltage criteria, poor anterior R-wave progression, PVCs in a bigeminal pattern  Impression & Recommendations:  Problem # 1:  HYPERTENSION (ICD-401.9) His blood pressure seems to be reasonably controlled if he takes his medication. I emphasized the need not to run out of these. I also instructed him to get a blood pressure cuff to keep his blood pressures at home or to have them done routinely at work so that we can understand whether he needs further meds titration. Continues to need therapeutic lifestyle changes.  Note I will check a basic metabolic profile today since he is on Aldactone. Orders: TLB-BMP (Basic Metabolic Panel-BMET) (80048-METABOL)  Problem # 2:  HYPERCHOLESTEROLEMIA (ICD-272.0) I will need to change his simvastatin to Lipitor 20 mg daily since he is on Norvasc. I will check a lipid profile today. Orders: TLB-Hepatic/Liver Function Pnl (80076-HEPATIC) TLB-Lipid Panel (80061-LIPID)  Problem # 3:  CARDIOMYOPATHY (ICD-425.4) He is asymptomatic. Management will be through titration of his blood pressure meds. Orders: TLB-BMP (Basic Metabolic Panel-BMET) (80048-METABOL)  Problem # 4:  PREMATURE VENTRICULAR CONTRACTIONS (ICD-427.69) He is not symptomatic with these. No change in therapy is indicated.  Patient Instructions: 1)  Your physician recommends that you schedule a follow-up appointment in: 6 months with Dr Antoine Poche 2)  Your physician recommends that you have lab work today   lipid,liver and BNP 3)  Your physician has recommended you make the following change in your medication: Stop simvastatin and start Lipitor 20mg  a day Prescriptions: LIPITOR 20 MG TABS (ATORVASTATIN CALCIUM) one daily  #30 x 11   Entered by:   Charolotte Capuchin, RN   Authorized  by:   Rollene Rotunda, MD, [facility]   Signed by:   Charolotte Capuchin, RN on 09/17/2009   Method used:   Electronically to        [facility] [address] (retail)       [phone]       [place], [place]  [address]       Ph: [phone]       Fax: [phone]   RxID:   [id number]  I have reviewed and approved all prescriptions at the time of this visit. Rollene Rotunda, MD, [facility]  September 17, 2009 10:00 AM

## 2010-02-28 NOTE — Assessment & Plan Note (Signed)
Summary: yearly/sl  Medications Added HYDROCHLOROTHIAZIDE 12.5 MG CAPS (HYDROCHLOROTHIAZIDE) 1 by mouth daiy ASPIRIN 81 MG  TABS (ASPIRIN) 1 by mouth daily BENAZEPRIL HCL 40 MG TABS (BENAZEPRIL HCL) one daily AMLODIPINE BESYLATE 5 MG TABS (AMLODIPINE BESYLATE) one daily      Allergies Added: NKDA  Visit Type:  Follow-up Primary Provider:  None  CC:  HTN and Cardiomyopathy.  History of Present Illness: The patient presents for followup of his hypertension and cardiomyopathy. In the past year he's had no acute cardiac complaints. However, for some reason he has stopped 2 of his antihypertensives and all of his diabetes medicines. He says he is no longer seeing his prior primary care doctor. He is not sure what happened to his other medications and doesn't recall what he was taking in the past. He thinks his blood pressures been okay when checked outside of doctors appointments. However, it's not clear how reliable this is. He denies any shortness of breath, PND or orthopnea. He is not reporting palpitations, presyncope or syncope. He does have some left axillary discomfort occasionally. However, this is sporadic. He is able to do such things as play basketball which he did recently without bringing on any of these symptoms. He has had no swelling or edema.  Current Medications (verified): 1)  Spironolactone 25 Mg Tabs (Spironolactone) .... 2 By Mouth Daily 2)  Hydrochlorothiazide 12.5 Mg Caps (Hydrochlorothiazide) .[name].. 1 By Mouth Daiy 3)  Aspirin 81 Mg  Tabs (Aspirin) .[name].. 1 By Mouth Daily  Allergies (verified): No Known Drug Allergies  Past History:  Past Medical History:  1. Hypertension.   2. Cardiomyopathy (EF approximately 45%).   3. Nonobstructive coronary artery disease (first diagonal 25%       stenosis, ramus intermedius 25% stenosis, this was on       catheterization in 2004).      Review of Systems       As stated in the HPI and negative for all other  systems.   Vital Signs:  Patient profile:   55 year old male Height:      65 inches Weight:      196 pounds BMI:     32.73 Pulse rate:   80 / minute Resp:     18 per minute BP sitting:   162 / 98  (right arm)  Vitals Entered By: Marrion Coy, CNA (April 25, 2009 8:54 AM)  Physical Exam  General:  Well developed, well nourished, in no acute distress. Head:  normocephalic and atraumatic Eyes:  PERRLA/EOM intact; conjunctiva and lids normal. Mouth:  Poor dentition, gums and palate normal. Oral mucosa normal. Neck:  Neck supple, no JVD. No masses, thyromegaly or abnormal cervical nodes. Chest Wall:  no deformities or breast masses noted Lungs:  Clear bilaterally to auscultation and percussion. Abdomen:  Bowel sounds positive; abdomen soft and non-tender without masses, organomegaly, or hernias noted. No hepatosplenomegaly. Msk:  Back normal, normal gait. Muscle strength and tone normal. Extremities:  No clubbing or cyanosis. Neurologic:  Alert and oriented x 3. Skin:  Intact without lesions or rashes. Cervical Nodes:  no significant adenopathy Axillary Nodes:  no significant adenopathy Inguinal Nodes:  no significant adenopathy Psych:  Normal affect.   Detailed Cardiovascular Exam  Neck    Carotids: Carotids full and equal bilaterally without bruits.      Neck Veins: Normal, no JVD.    Heart    Inspection: no deformities or lifts noted.      Palpation: normal PMI  with no thrills palpable.      Auscultation: regular rate and rhythm, S1, S2 without murmurs, rubs, gallops, or clicks.    Vascular    Abdominal Aorta: no palpable masses, pulsations, or audible bruits.      Femoral Pulses: normal femoral pulses bilaterally.      Pedal Pulses: normal pedal pulses bilaterally.      Radial Pulses: normal radial pulses bilaterally.      Peripheral Circulation: no clubbing, cyanosis, or edema noted with normal capillary refill.     EKG  Procedure date:   04/25/2009  Findings:      sinus rhythm, left ventricular hypertrophy by voltage criteria, PVCs in a bigeminal pattern, no acute ST-T wave changes  Impression & Recommendations:  Problem # 1:  HYPERTENSION (ICD-401.9) His blood pressure is elevated but he's been off of many of his medicines. I will restart enalapril 40 mg daily. I will start his Norvasc back at a lower dose of 5 mg. I will then titrate meds as needed. Orders: EKG w/ Interpretation (93000) Primary Care Referral (Primary) TLB-CBC Platelet - w/Differential (85025-CBCD) TLB-BMP (Basic Metabolic Panel-BMET) (80048-METABOL) TLB-Hepatic/Liver Function Pnl (80076-HEPATIC) TLB-A1C / Hgb A1C (Glycohemoglobin) (83036-A1C) TLB-TSH (Thyroid Stimulating Hormone) (84443-TSH) TLB-Lipid Panel (80061-LIPID)  Problem # 2:  CARDIOMYOPATHY (ICD-425.4) The meds will be as above. I will repeat an echocardiogram in the months to come.  At this point he seems to have class I symptoms.  Problem # 3:  DM (ICD-250.00) The patient requests a new primary care physician. He has not seen his in quite a while. I will take the liberty of ordering all of the screening labs below. Of note he has not had prostate or colon screening as far as he can recall. I will defer this as well as management of his diabetes to Dr. [name]. Orders: Primary Care Referral (Primary) TLB-CBC Platelet - w/Differential (85025-CBCD) TLB-BMP (Basic Metabolic Panel-BMET) (80048-METABOL) TLB-Hepatic/Liver Function Pnl (80076-HEPATIC) TLB-A1C / Hgb A1C (Glycohemoglobin) (83036-A1C) TLB-TSH (Thyroid Stimulating Hormone) (84443-TSH) TLB-Lipid Panel (80061-LIPID) TLB-PSA (Prostate Specific Antigen) (84153-PSA)  Problem # 4:  CAD (ICD-414.00) No further cardiovascular testing is indicated.  He needs risk reduction. Orders: EKG w/ Interpretation (93000) TLB-CBC Platelet - w/Differential (85025-CBCD) TLB-BMP (Basic Metabolic Panel-BMET) (80048-METABOL) TLB-Hepatic/Liver  Function Pnl (80076-HEPATIC) TLB-A1C / Hgb A1C (Glycohemoglobin) (83036-A1C) TLB-TSH (Thyroid Stimulating Hormone) (84443-TSH) TLB-Lipid Panel (80061-LIPID) TLB-PSA (Prostate Specific Antigen) (84153-PSA)  Patient Instructions: 1)  Your physician recommends that you schedule a follow-up appointment as needed 2)  Your physician recommends that you have  lab work today: lipid/liver/TSH/CBC/PSA/HA1C 401.1 428.22 250.00 3)  You have been referred to Rene Paci 4)  You have been diagnosed with Congestive Heart Failure or CHF.  CHF is a condition in which a problem with the structure or function of the heart impairs its ability to supply sufficient blood flow to meet the body's needs.  For further information please visit www.cardiosmart.org for detailed information on CHF. 5)  Your physician recommends that you weigh, daily, at the same time every day, and in the same amount of clothing.  Please record your daily weights on the handout provided and bring it to your next appointment. 6)  Your physician has recommended you make the following change in your medication: Start benazapril 40 mg a daily and Amlodipine 5 mg daily Prescriptions: AMLODIPINE BESYLATE 5 MG TABS (AMLODIPINE BESYLATE) one daily  #30 x 11   Entered by:   Charolotte Capuchin, RN   Authorized by:   Rollene Rotunda, MD,  FACC   Signed by:   Charolotte Capuchin, RN on 04/25/2009   Method used:   Electronically to        [facility] [address] (retail)       [phone]       [place], [place]  [address]       Ph: [phone]       Fax: [phone]   RxID:   [id number] BENAZEPRIL HCL 40 MG TABS (BENAZEPRIL HCL) one daily  #30 x 11   Entered by:   Charolotte Capuchin, RN   Authorized by:   Rollene Rotunda, MD, [facility]   Signed by:   Charolotte Capuchin, RN on 04/25/2009   Method used:   Electronically to        [facility] [address] (retail)       [phone]        [place], [place]  [address]       Ph: [phone]       Fax: [phone]   RxID:   [id number]

## 2010-02-28 NOTE — Miscellaneous (Signed)
Summary: refill HCTZ  Clinical Lists Changes  Medications: Rx of HYDROCHLOROTHIAZIDE 12.5 MG CAPS (HYDROCHLOROTHIAZIDE) 1 by mouth daiy;  #30 x 11;  Signed;  Entered by: Charolotte Capuchin, RN;  Authorized by: Rollene Rotunda, MD, [facility];  Method used: Electronically to [facility] [phone] [place], [place], [place]  [address], Ph: [phone], Fax: [phone]    Prescriptions: HYDROCHLOROTHIAZIDE 12.5 MG CAPS (HYDROCHLOROTHIAZIDE) 1 by mouth daiy  #30 x 11   Entered by:   Charolotte Capuchin, RN   Authorized by:   Rollene Rotunda, MD, [facility]   Signed by:   Charolotte Capuchin, RN on 04/25/2009   Method used:   Electronically to        [facility] [address] (retail)       [phone]       [place], [place]  [address]       Ph: [phone]       Fax: [phone]   RxID:   [id number]

## 2010-02-28 NOTE — Letter (Signed)
Summary: Previsit letter  [facility]  [address] [place], [place] [address]   Phone: [phone]  Fax: [phone]       04/26/2009 MRN: [id number]  [name] [address] [name], [place]  [address]  Dear [name],  Welcome to the Gastroenterology Division at [facility].    You are scheduled to see a nurse for your pre-procedure visit on 05-09-09 at 10:00a.m. on the 3rd floor at [facility], [address]  We ask that you try to arrive at our office 15 minutes prior to your appointment time to allow for check-in.  Your nurse visit will consist of discussing your medical and surgical history, your immediate family medical history, and your medications.    Please bring a complete list of all your medications or, if you prefer, bring the medication bottles and we will list them.  We will need to be aware of both prescribed and over the counter drugs.  We will need to know exact dosage information as well.  If you are on blood thinners (Coumadin, Plavix, Aggrenox, Ticlid, etc.) please call our office today/prior to your appointment, as we need to consult with your physician about holding your medication.   Please be prepared to read and sign documents such as consent forms, a financial agreement, and acknowledgement forms.  If necessary, and with your consent, a friend or relative is welcome to sit-in on the nurse visit with you.  Please bring your insurance card so that we may make a copy of it.  If your insurance requires a referral to see a specialist, please bring your referral form from your primary care physician.  No co-pay is required for this nurse visit.     If you cannot keep your appointment, please call [phone] to cancel or reschedule prior to your appointment date.  This allows [place] the opportunity to schedule an appointment for another patient in need of care.    Thank you for choosing [facility] for your medical  needs.  We appreciate the opportunity to care for you.  Please visit [place] at our website  to learn more about our practice.                     Sincerely.                                                                                                                   The Gastroenterology Division

## 2010-03-11 ENCOUNTER — Emergency Department (HOSPITAL_COMMUNITY)
Admission: EM | Admit: 2010-03-11 | Discharge: 2010-03-12 | Disposition: A | Discharge status: Home / Self Care | Payer: BC Managed Care – PPO | Attending: Emergency Medicine | Admitting: Emergency Medicine

## 2010-03-11 DIAGNOSIS — R112 Nausea with vomiting, unspecified: Secondary | ICD-10-CM | POA: Insufficient documentation

## 2010-03-11 DIAGNOSIS — R197 Diarrhea, unspecified: Secondary | ICD-10-CM | POA: Insufficient documentation

## 2010-03-11 DIAGNOSIS — I10 Essential (primary) hypertension: Secondary | ICD-10-CM | POA: Insufficient documentation

## 2010-03-11 DIAGNOSIS — E119 Type 2 diabetes mellitus without complications: Secondary | ICD-10-CM | POA: Insufficient documentation

## 2010-03-11 DIAGNOSIS — B9789 Other viral agents as the cause of diseases classified elsewhere: Secondary | ICD-10-CM | POA: Insufficient documentation

## 2010-03-11 LAB — DIFFERENTIAL
Basophils Absolute: 0.1 10*3/uL (ref 0.0–0.1)
Basophils Relative: 1 % (ref 0–1)
Eosinophils Absolute: 0.1 10*3/uL (ref 0.0–0.7)
Eosinophils Relative: 1 % (ref 0–5)
Lymphocytes Relative: 40 % (ref 12–46)
Lymphs Abs: 3.1 10*3/uL (ref 0.7–4.0)
Monocytes Absolute: 0.7 10*3/uL (ref 0.1–1.0)
Monocytes Relative: 9 % (ref 3–12)
Neutro Abs: 3.8 10*3/uL (ref 1.7–7.7)
Neutrophils Relative %: 50 % (ref 43–77)

## 2010-03-11 LAB — CBC
HCT: 37.9 % — ABNORMAL LOW (ref 39.0–52.0)
Hemoglobin: 13.2 g/dL (ref 13.0–17.0)
MCH: 30.4 pg (ref 26.0–34.0)
MCHC: 34.8 g/dL (ref 30.0–36.0)
MCV: 87.3 fL (ref 78.0–100.0)
Platelets: 298 10*3/uL (ref 150–400)
RBC: 4.34 MIL/uL (ref 4.22–5.81)
RDW: 12.2 % (ref 11.5–15.5)
WBC: 7.8 10*3/uL (ref 4.0–10.5)

## 2010-03-11 LAB — BASIC METABOLIC PANEL
BUN: 17 mg/dL (ref 6–23)
CO2: 28 mEq/L (ref 19–32)
Calcium: 9.5 mg/dL (ref 8.4–10.5)
Chloride: 103 mEq/L (ref 96–112)
Creatinine, Ser: 1.28 mg/dL (ref 0.4–1.5)
GFR calc Af Amer: >60 mL/min (ref >60)
GFR calc non Af Amer: 58 mL/min — ABNORMAL LOW (ref >60)
Glucose, Bld: 286 mg/dL — ABNORMAL HIGH (ref 70–99)
Potassium: 3.8 mEq/L (ref 3.5–5.1)
Sodium: 137 mEq/L (ref 135–145)

## 2010-04-12 LAB — DIFFERENTIAL
Basophils Absolute: 0.2 10*3/uL — ABNORMAL HIGH (ref 0.0–0.1)
Basophils Relative: 2 % — ABNORMAL HIGH (ref 0–1)
Eosinophils Absolute: 0.2 10*3/uL (ref 0.0–0.7)
Eosinophils Relative: 2 % (ref 0–5)
Lymphocytes Relative: 34 % (ref 12–46)
Lymphs Abs: 2.8 10*3/uL (ref 0.7–4.0)
Monocytes Absolute: 0.7 10*3/uL (ref 0.1–1.0)
Monocytes Relative: 9 % (ref 3–12)
Neutro Abs: 4.2 10*3/uL (ref 1.7–7.7)
Neutrophils Relative %: 52 % (ref 43–77)

## 2010-04-12 LAB — POCT I-STAT, CHEM 8
BUN: 11 mg/dL (ref 6–23)
Calcium, Ion: 1.16 mmol/L (ref 1.12–1.32)
Chloride: 108 mEq/L (ref 96–112)
Creatinine, Ser: 1.4 mg/dL (ref 0.4–1.5)
Glucose, Bld: 105 mg/dL — ABNORMAL HIGH (ref 70–99)
HCT: 44 % (ref 39.0–52.0)
Hemoglobin: 15 g/dL (ref 13.0–17.0)
Potassium: 3.8 mEq/L (ref 3.5–5.1)
Sodium: 141 mEq/L (ref 135–145)
TCO2: 23 mmol/L (ref 0–100)

## 2010-04-12 LAB — CBC
HCT: 40.8 % (ref 39.0–52.0)
Hemoglobin: 13.8 g/dL (ref 13.0–17.0)
MCH: 30.3 pg (ref 26.0–34.0)
MCHC: 33.9 g/dL (ref 30.0–36.0)
MCV: 89.5 fL (ref 78.0–100.0)
Platelets: 292 10*3/uL (ref 150–400)
RBC: 4.56 MIL/uL (ref 4.22–5.81)
RDW: 13.6 % (ref 11.5–15.5)
WBC: 8 10*3/uL (ref 4.0–10.5)

## 2010-04-12 LAB — POCT CARDIAC MARKERS
CKMB, poc: <1 ng/mL — ABNORMAL LOW (ref 1.0–8.0)
Myoglobin, poc: 67 ng/mL (ref 12–200)
Troponin i, poc: <0.05 ng/mL (ref 0.00–0.09)

## 2010-04-12 LAB — GLUCOSE, CAPILLARY: Glucose-Capillary: 105 mg/dL — ABNORMAL HIGH (ref 70–99)

## 2010-04-16 LAB — GLUCOSE, CAPILLARY
Glucose-Capillary: 85 mg/dL (ref 70–99)
Glucose-Capillary: 89 mg/dL (ref 70–99)

## 2010-04-17 LAB — CBC
HCT: 39.7 % (ref 39.0–52.0)
Hemoglobin: 13.6 g/dL (ref 13.0–17.0)
MCHC: 34.3 g/dL (ref 30.0–36.0)
MCV: 89.7 fL (ref 78.0–100.0)
Platelets: 277 10*3/uL (ref 150–400)
RBC: 4.43 MIL/uL (ref 4.22–5.81)
RDW: 12.9 % (ref 11.5–15.5)
WBC: 8.7 10*3/uL (ref 4.0–10.5)

## 2010-04-17 LAB — DIFFERENTIAL
Basophils Absolute: 0 10*3/uL (ref 0.0–0.1)
Basophils Relative: 1 % (ref 0–1)
Eosinophils Absolute: 0.2 10*3/uL (ref 0.0–0.7)
Eosinophils Relative: 2 % (ref 0–5)
Lymphocytes Relative: 32 % (ref 12–46)
Lymphs Abs: 2.8 10*3/uL (ref 0.7–4.0)
Monocytes Absolute: 0.8 10*3/uL (ref 0.1–1.0)
Monocytes Relative: 9 % (ref 3–12)
Neutro Abs: 4.9 10*3/uL (ref 1.7–7.7)
Neutrophils Relative %: 56 % (ref 43–77)

## 2010-04-17 LAB — CARDIAC PANEL(CRET KIN+CKTOT+MB+TROPI)
CK, MB: 0.7 ng/mL (ref 0.3–4.0)
Relative Index: 0.6 (ref 0.0–2.5)
Total CK: 125 U/L (ref 7–232)
Troponin I: 0.02 ng/mL (ref 0.00–0.06)

## 2010-04-17 LAB — COMPREHENSIVE METABOLIC PANEL
ALT: 24 U/L (ref 0–53)
AST: 23 U/L (ref 0–37)
Albumin: 3.3 g/dL — ABNORMAL LOW (ref 3.5–5.2)
Alkaline Phosphatase: 53 U/L (ref 39–117)
BUN: 21 mg/dL (ref 6–23)
CO2: 26 mEq/L (ref 19–32)
Calcium: 8.9 mg/dL (ref 8.4–10.5)
Chloride: 107 mEq/L (ref 96–112)
Creatinine, Ser: 1.43 mg/dL (ref 0.4–1.5)
GFR calc Af Amer: >60 mL/min (ref >60)
GFR calc non Af Amer: 51 mL/min — ABNORMAL LOW (ref >60)
Glucose, Bld: 105 mg/dL — ABNORMAL HIGH (ref 70–99)
Potassium: 3.8 mEq/L (ref 3.5–5.1)
Sodium: 137 mEq/L (ref 135–145)
Total Bilirubin: 0.6 mg/dL (ref 0.3–1.2)
Total Protein: 6.7 g/dL (ref 6.0–8.3)

## 2010-04-17 LAB — CK TOTAL AND CKMB (NOT AT ARMC)
CK, MB: 0.9 ng/mL (ref 0.3–4.0)
Relative Index: 0.7 (ref 0.0–2.5)
Total CK: 133 U/L (ref 7–232)

## 2010-04-17 LAB — POCT CARDIAC MARKERS
CKMB, poc: <1 ng/mL — ABNORMAL LOW (ref 1.0–8.0)
Myoglobin, poc: 65.7 ng/mL (ref 12–200)
Troponin i, poc: <0.05 ng/mL (ref 0.00–0.09)

## 2010-04-17 LAB — POCT I-STAT, CHEM 8
BUN: 25 mg/dL — ABNORMAL HIGH (ref 6–23)
Calcium, Ion: 1.13 mmol/L (ref 1.12–1.32)
Chloride: 110 mEq/L (ref 96–112)
Creatinine, Ser: 1.5 mg/dL (ref 0.4–1.5)
Glucose, Bld: 76 mg/dL (ref 70–99)
HCT: 41 % (ref 39.0–52.0)
Hemoglobin: 13.9 g/dL (ref 13.0–17.0)
Potassium: 4 mEq/L (ref 3.5–5.1)
Sodium: 139 mEq/L (ref 135–145)
TCO2: 22 mmol/L (ref 0–100)

## 2010-04-17 LAB — GLUCOSE, CAPILLARY
Glucose-Capillary: 117 mg/dL — ABNORMAL HIGH (ref 70–99)
Glucose-Capillary: 160 mg/dL — ABNORMAL HIGH (ref 70–99)
Glucose-Capillary: 162 mg/dL — ABNORMAL HIGH (ref 70–99)

## 2010-04-17 LAB — TROPONIN I: Troponin I: 0.02 ng/mL (ref 0.00–0.06)

## 2010-04-17 LAB — PROTIME-INR
INR: 1.05 (ref 0.00–1.49)
Prothrombin Time: 13.6 seconds (ref 11.6–15.2)

## 2010-04-24 ENCOUNTER — Inpatient Hospital Stay (INDEPENDENT_AMBULATORY_CARE_PROVIDER_SITE_OTHER)
Admission: RE | Admit: 2010-04-24 | Discharge: 2010-04-24 | Disposition: A | Discharge status: Home / Self Care | Payer: BC Managed Care – PPO | Source: Ambulatory Visit | Attending: Emergency Medicine | Admitting: Emergency Medicine

## 2010-04-24 DIAGNOSIS — H05019 Cellulitis of unspecified orbit: Secondary | ICD-10-CM

## 2010-04-24 DIAGNOSIS — H01009 Unspecified blepharitis unspecified eye, unspecified eyelid: Secondary | ICD-10-CM

## 2010-05-22 ENCOUNTER — Other Ambulatory Visit: Payer: Self-pay | Admitting: *Deleted

## 2010-05-22 MED ORDER — GLIMEPIRIDE 2 MG PO TABS: 2.0000 mg | ORAL_TABLET | Freq: Every day | ORAL | Status: DC

## 2010-05-22 NOTE — Telephone Encounter (Signed)
Sent e-script tp [facility]

## 2010-05-24 ENCOUNTER — Other Ambulatory Visit: Payer: Self-pay | Admitting: *Deleted

## 2010-05-24 MED ORDER — BENAZEPRIL HCL 40 MG PO TABS: 40.0000 mg | ORAL_TABLET | Freq: Every day | ORAL | Status: DC

## 2010-05-24 MED ORDER — AMLODIPINE BESYLATE 5 MG PO TABS: 5.0000 mg | ORAL_TABLET | Freq: Every day | ORAL | Status: DC

## 2010-06-07 ENCOUNTER — Encounter: Payer: Self-pay | Admitting: Cardiology

## 2010-06-11 ENCOUNTER — Ambulatory Visit: Payer: BC Managed Care – PPO | Admitting: Cardiology

## 2010-06-11 NOTE — H&P (Signed)
[name], [name] NO.:  [id number]   MEDICAL RECORD NO.:  [id number]          PATIENT TYPE:  INP   LOCATION:  0106                         FACILITY:  [facility]   PHYSICIAN:  Eduard Clos, MDDATE OF BIRTH:  18-Aug-1955   DATE OF ADMISSION:  09/20/2007  DATE OF DISCHARGE:                              HISTORY & PHYSICAL   CHIEF COMPLAINT:  Increased frequency of urination and shortness of  breath.   HISTORY OF PRESENT ILLNESS:  A 55 year old male with history of  cardiomyopathy, with EF last admission per cardiology reports was 40%.  He also has a history of nonobstructive CAD and hypertension.  He  presented to the ER because of frequent urination over the last 3-4  days, along with some thirst.  The patient also has been experiencing  some shortness of breath.  The patient stated the shortness of breath  has lasted for the last 2-3 days.  He states it is with exertion.  He  denies any loss of consciousness, any weakness or tremors; nausea,  vomiting, diaphoresis, palpitations, abdominal pain; any dysuria or  discharge.  However, he does have frequency of urination.  He denies any  chest pain.  The patient was found to have a blood sugar around 600.  The patient was admitted for further workup and management of his  diabetes and shortness of breath.   The patient also stated that he had some transient numbness in the left  hand 4-5 days ago, and some blurred vision at the same time (which has  resolved now).   PAST MEDICAL HISTORY:  1. Hypertension.  2. Cardiomyopathy.  3. History of nonobstructive coronary artery disease.  4. The patient was diagnosed with diabetes mellitus type 2 in 2002,      and he stopped taking medication after blood sugars were normalized      per the patient.   PAST SURGICAL HISTORY:  Cardiac catheterization, which was normal per  the patient.   MEDICATIONS ON ADMISSION:  1. Hydrochlorothiazide 12.5 mg p.o. daily.  2.  Benazepril 40 mg p.o. daily.  3. Spironolactone 50 mg p.o. daily.  4. Norvasc 10 mg p.o. daily.   ALLERGIES:  NO KNOWN DRUG ALLERGIES.   FAMILY HISTORY:  Noncontributory.   SOCIAL HISTORY:  The patient denies smoking cigarettes, drinking alcohol  or abusing drugs.   REVIEW OF SYSTEMS:  As per history of present illness, nothing else  significant.   PHYSICAL EXAMINATION:  The patient was examined at the bedside; not in  acute distress.  VITAL SIGNS:  Blood pressure 106/70, pulse 56 per minute, temperature  98.3, respirations 20 per minute, O2 saturation 99%.  HEENT:  Anicteric.  No pallor.  PERRLA.  CHEST:  Bilateral air entry with no wheezes, rales or rhonchi.  HEART:  S1 and S2 heard.  ABDOMEN:  Soft, nontender.  Bowel sounds were heard.  No guarding.  No  rigidity seen.  NEUROLOGIC:  The patient oriented to time, place and person.  Moves  upper and lower extremities at the bedside.  EXTREMITIES:  Peripheral pulses felt.  No edema.  LABS:  CBC:  WBC 9.4, hemoglobin 15, hematocrit 44, platelets 300,  neutrophils 64%.  Basic Metabolic Panel:  Sodium 130, potassium 4.3,  chloride 98, glucose 631, BUN 34, creatinine 2.4.  CK-MB 1.1, troponin I  11.05.  UA:  Negative for ketones, blood; negative for leukocytes,  nitrites.   ASSESSMENT:  1. Hyperosmolality.  Noncardiac.  2. Diabetes mellitus type 2.  3. Dehydration.  4. History of cardiomegaly with last ejection fraction 40%, as per      cardiology.  5. History of nonobstructive coronary artery disease.  6. Probable chronic kidney disease.   PLAN:  Will admit the patient to telemetry.  Will cycle cardiac markers,  start the patient on Lantus insulin 10 units subcutaneously and Novolin  daily at bedtime.  Will place the patient on CBG q.4 h. and change to ac  and hs when blood sugar is more controlled.  Will recheck a basic  metabolic panel STAT to chesck foranion gap, will start on IV insulin,  if there is anion gap.  Will  place the patient on gentle hydration, hold  off hydrochlorothiazide and Spironolactone for 1-2 days.  Will get a CT  of the head without contrast, as the patient is complaining of some  numbness and blurred vision 4-5 days ago.  Further recommendations as  patient condition evolves.      Eduard Clos, MD  Electronically Signed     ANK/MEDQ  D:  09/20/2007  T:  09/20/2007  Job:  [id number]

## 2010-06-11 NOTE — Assessment & Plan Note (Signed)
[name]                            CARDIOLOGY OFFICE NOTE   [name], [name]                      MRN:          [id number]  DATE:09/28/2007                            DOB:          1955-04-03    PRIMARY CARE PHYSICIAN:  Lonia Blood, MD   REASON FOR PRESENTATION:  Evaluate the patient with hypertension,  cardiomyopathy, and newly diagnosed diabetes.   HISTORY OF PRESENT ILLNESS:  The patient is 55 years old.  He returns  for followup.  I have not seen him in a year.  Since I last saw him, he  has been diagnosed with diabetes and actually he was in the hospital a  week ago with hyperglycemia.  He was started on multiple medications for  management of this.  He also had some shortness of breath on that  admission.  He had renal insufficiency.  His creatinine did improve.  He  is now checking his hemoglobin and administering his insulin and taking  his pills.  He is yet to set up a new patient appointment with Dr.  Mikeal Hawthorne.  Of note, his BNP was less than 30 on that admission.   He does get dyspneic with moderate activity, but this seems to be at  baseline.  He is not having any new shortness of breath.  He has not  been having any resting shortness of breath.  He denies any PND or  orthopnea.  He has not been getting any chest discomfort, neck or arm  discomfort.  He has not been having any palpitation, presyncope, or  syncope.   PAST MEDICAL HISTORY:  1. Hypertension.  2. Cardiomyopathy (EF approximately 40%).  3. Nonobstructive coronary artery disease (first diagonal 25%      stenosis, ramus intermediate 25% stenosis.  This was on      catheterization in 2004).   ALLERGIES:  None.   MEDICATIONS:  1. Hydrochlorothiazide 12.5 mg daily.  2. Aspirin 81 mg daily.  3. Benazepril 40 mg daily.  4. Spironolactone 50 mg daily.  5. Nicardipine 30 mg daily.  6. Amlodipine 10 mg daily.  7. Lantus.  8. Metformin 1000 mg b.i.d.  9. Glimepiride 2  mg daily.   REVIEW OF SYSTEMS:  As stated in the HPI and otherwise negative for  other systems.   PHYSICAL EXAMINATION:  GENERAL:  The patient is in no distress.  VITAL SIGNS:  Blood pressure 128/72, heart rate 60 and regular, and body  mass index 32.  HEENT:  Eyelids unremarkable.  Pupils equal, round, and reactive to  light.  Fundi not visualized.  Oral mucosa unremarkable.  NECK:  No jugular venous distension at 45 degrees, carotid upstroke  brisk and symmetrical.  No bruits.  No thyromegaly.  LYMPHATICS:  No cervical, axillary, or inguinal lymphadenopathy.  LUNGS:  Clear to auscultation bilaterally.  BACK:  No costovertebral angle tenderness.  CHEST:  Unremarkable.  HEART:  PMI not displaced or sustained.  S1 and S2 within normal limits.  No S3, no S4.  No clicks.  No rubs.  No murmurs.  ABDOMEN:  Obese, positive bowel  sounds, normal in frequency and pitch.  No bruits.  No rebound, no guarding, no midline pulsatile mass.  No  hepatomegaly, no splenomegaly.  SKIN:  No rashes.  No nodules.  EXTREMITIES:  2+ pulses throughout.  No edema.  No cyanosis, no  clubbing.  NEURO:  Oriented to person, place, and time.  Cranial nerves II through  XII grossly intact.  Motor grossly intact.   ASSESSMENT AND PLAN:  1. Dyspnea.  The patient has dyspnea with normal BNP.  He has had      nonobstructive coronary artery disease and continued ongoing risk      factors.  I am going to screen him with a stress perfusion study.      He will have an exercise Cardiolite as the pretest probability of      obstructive coronary artery disease is moderately high.  2. Cardiomyopathy.  The Cardiolite will allow [place] to judge his ejection      fraction.  For now, he will continue on the medications as listed.      I might going to titrate beta-blocker going forward.  3. Diabetes.  He is encouraged to follow with Dr. Mikeal Hawthorne as a new      patient for management of this.  4. Dyslipidemia.  The goal LDL should be  less than 100 and HDL greater      than 40 given his new diagnosis of diabetes mellitus.  5. Obesity.  He understands the need to lose weight with diet and      exercise.  6. Hypertension.  Blood pressure is currently controlled and he will      continue the medications as listed.  7. Premature ventricular contractions.  He is not having any symptoms      related to this.  Further management and lab will be based on      estimates of his ejection fraction.  8. Followup.  I would like to see him back in about 6 weeks or sooner      to review all and to reemphasize risk reduction.  He did not      followup with his last appointment.     Rollene Rotunda, MD, [facility]  Electronically Signed    JH/MedQ  DD: 09/28/2007  DT: 09/29/2007  Job #: [id number]   cc:   Lonia Blood, M.D.

## 2010-06-11 NOTE — Discharge Summary (Signed)
[name], [name]             ACCOUNT NO.:  [id number]   MEDICAL RECORD NO.:  [id number]          PATIENT TYPE:  INP   LOCATION:  1407                         [facility]ACILITY[facility]  [facility]   PHYSICIAN:  Lonia Blood, M.D.       DATE OF BIRTH:  12-15-1955   DATE OF ADMISSION:  09/20/2007  DATE OF DISCHARGE:  09/21/2007                               DISCHARGE SUMMARY   PRIMARY CARE PHYSICIAN:  This patient was referred to Dr. Lonia Blood.   DISCHARGE DIAGNOSES:  1. Uncontrolled diabetes mellitus type 2 with hyperosmolar syndrome.  2. Hypertension.  3. Cardiomyopathy.   DISCHARGE MEDICATIONS:  1. Benazepril 40 mg daily.  2. Spironolactone 50 mg daily.  3. Amlodipine 10 mg daily.  4. Hydrochlorothiazide 12.5 mg daily.  5. Lantus 10 units at bedtime.  6. Amaryl 10 mg each morning.  7. Metformin 1000 mg twice a day.   CONDITION ON DISCHARGE:  [name] is discharged in good condition.  He is instructed to call Dr. Benna Dunks and set up an appointment as a new  patient.  The patient is told to check his fingerstick sugars each  morning and adjust his insulin at that time according to the results of  his fingerstick blood sugars.   PROCEDURES THIS ADMISSION:  No procedures done.   CONSULTATIONS THIS ADMISSION:  No consultations obtained.   HISTORY AND PHYSICAL:  Please refer to the dictated history and physical  done by Dr. Toniann Fail on September 20, 2007.   HOSPITAL COURSE:  1. Uncontrolled diabetes mellitus type 2 with hyperosmolar-type      syndrome with a hemoglobin A1c of 10.4  [name] was admitted      via the emergency room where he had CBG of more than 500.  The      patient was complaining of polyuria and polydipsia.  [name]      was diagnosed with uncontrolled diabetes mellitus type 2 and he was      placed on intravenous fluids, and briefly on an insulin drip.  He      was quickly transitioned to subcutaneous insulin.  The patient was      educated on insulin  administration and monitoring  prior to      discharge.      The patient was discharged on Amaryl, metformin and Lantus, and      referred to outpatient diabetes education.  2. Hypertension with cardiomyopathy.  This has been stable throughout      this admission without any need of changing any of this patient's      medications.      Lonia Blood, M.D.  Electronically Signed     SL/MEDQ  D:  09/21/2007  T:  09/21/2007  Job:  [id number]   cc:   Rollene Rotunda, MD, [facility]  [address]  [address]  [place]  [place] [address]   Lonia Blood, M.D.

## 2010-06-11 NOTE — Assessment & Plan Note (Signed)
[name]                            CARDIOLOGY OFFICE NOTE   [name]                      MRN:          [id number]  DATE:03/31/2008                            DOB:          07/08/1955    PRIMARY CARE PHYSICIAN:  Lonia Blood, MD   REASON FOR PRESENTATION:  Evaluate the patient with hypertension and  cardiomyopathy.   HISTORY OF PRESENT ILLNESS:  The patient is 55 years old.  He presents  for followup of the above.  He has done quite well since I last saw him.  He has had no cardiovascular complaints.  He denies any chest pain.  He  has had no palpitations, presyncope, or syncope.  He has had no PND or  orthopnea.  He is not exercising as much as I would like, though he  works.  He is complaining of erectile dysfunction.   Of note, since I last saw him, he had a stress perfusion study which  demonstrated questionable apical ischemia, but this was a low-risk  study.  There was no EF on this because of premature ventricular  contractions.  However, an echocardiogram demonstrated the EF to be  about the same as it had been in the past which is approximately 45%.   PAST MEDICAL HISTORY:  1. Hypertension.  2. Cardiomyopathy (EF approximately 45%).  3. Nonobstructive coronary artery disease (first diagonal 25%      stenosis, ramus intermedius 25% stenosis, this was on      catheterization in 2004).   ALLERGIES:  None.   MEDICATIONS:  1. Hydrochlorothiazide 12.5 mg daily.  2. Aspirin 81 mg daily.  3. Benazepril 40 mg daily.  4. Spironolactone 50 mg daily.  5. Nicardipine 30 mg t.i.d.  6. Amlodipine 10 mg daily.  7. Lantus.   REVIEW OF SYSTEMS:  As stated in the HPI and otherwise, negative for  other systems.   PHYSICAL EXAMINATION:  GENERAL:  The patient is in no distress.  VITAL SIGNS:  Blood pressure 118/80, heart rate 36 and regular, and  weight 195 pounds.  HEENT:  Eyes are unremarkable, pupils equal, round, and reactive  to  light, fundi not visualized, oral mucosa unremarkable.  NECK:  No jugular venous distension at 45 degrees, carotid upstroke  brisk and symmetric, no bruits, no thyromegaly.  LYMPHATICS:  No adenopathy.  LUNGS:  Clear to auscultation bilaterally.  BACK:  No costovertebral angle tenderness.  CHEST:  Unremarkable.  HEART:  PMI not displaced or sustained, S1 and S2 within normal limits,  no S3, no S4, no clicks, no rubs, no murmurs.  ABDOMEN:  Obese, positive bowel sounds, normal in frequency and pitch,  no bruits, no rebound, no guarding, no midline pulsatile mass, no  organomegaly.  SKIN:  No rashes, no nodules.  EXTREMITIES:  Pulses 2+, no edema.   EKG; sinus rhythm, rate 62, left axis deviation, left ventricular  hypertrophy, premature ventricular contractions in a trigeminal pattern,  no acute ST-T wave changes.   ASSESSMENT AND PLAN:  1. Cardiomyopathy.  The patient's ejection fraction is mildly reduced.  He has no symptoms.  At this point, we will continue medical      management as listed above.  I will check a BMET today as he is on      spironolactone and this should be done 2-3 times per year at least.  2. Premature ventricular contractions.  He is having these, but he is      not noticing it.  No further therapy is warranted.  3. Hypertension.  His blood pressure is well controlled and I will      continue the meds as listed.  4. Erectile dysfunction.  I have taken the liberty of giving him      Viagra but would ask that refills come from Dr. Mikeal Hawthorne, if he      agrees.  The patient has no contraindications to this medication.  5. Obese.  He will be talked about the need to lose weight with diet      and exercise.  His wife wants him to join a gym and I agree.  6. Followup.  I can see him back in 1 year or sooner if needed.     Rollene Rotunda, MD, [facility], [facility], [facility]  Electronically Signed    JH/MedQ  DD: 03/31/2008  DT: 04/01/2008  Job #: [id number]   cc:   Lonia Blood, M.D.

## 2010-06-14 NOTE — Cardiovascular Report (Signed)
[name], [name]                         ACCOUNT NO.:  [id number]   MEDICAL RECORD NO.:  [id number]                   PATIENT TYPE:  OIB   LOCATION:  6501                                 FACILITY:  [facility]   PHYSICIAN:  Rollene Rotunda, M.D.                DATE OF BIRTH:  December 01, 1955   DATE OF PROCEDURE:  01/13/2003  DATE OF DISCHARGE:                              CARDIAC CATHETERIZATION   PRIMARY CARE PHYSICIAN:  Hillery Aldo, M.D.   PROCEDURE:  Left heart catheterization, coronary arteriography.   INDICATION:  Evaluate patient with an abnormal Cardiolite suggesting an EF  of 33% with evidence of mild apical ischemia and anteroseptal ischemia with  global hypokinesis.   PROCEDURE NOTE:  Left heart catheterization was performed via the right  femoral artery.  The artery was cannulated using an anterior wall puncture.  A 4 French arterial sheath was inserted via the modified Seldinger  technique.  Preformed Judkins and a pigtail catheter were utilized.  The  patient tolerated the procedure well and left the lab in stable condition.   RESULTS:   HEMODYNAMICS:  1. LV 120/5.  2. Aortic 125/72.   CORONARIES:  The left main was normal.  The LAD wrapped the apex and had  luminal irregularities.  There is a very large first diagonal which had  proximal 25% stenosis.  The circumflex in the AV groove was normal.  There  was a very large ramus intermediate with proximal 25% stenosis.  The right  coronary artery was dominant and normal with PDA and posterior lateral.   LEFT VENTRICULOGRAM:  Left ventriculogram was obtained in the RAO  projection.  The EF was approximately 45-50% with global hypokinesis.   CONCLUSION:  1. Mild left ventricular dysfunction.  2. No obstructive coronary disease.   PLAN:  The etiology for the global hypokinesis may well be hypertension as  it has been poorly controlled in the past.  He is now taking his medications  and adherent with followup.  He  will continue to have this managed  aggressively along with other primary risk reduction.                                               Rollene Rotunda, M.D.    JH/MEDQ  D:  01/13/2003  T:  01/13/2003  Job:  [id number]

## 2010-06-14 NOTE — Discharge Summary (Signed)
[facility]  Patient:    [name], [name]                      MRN: [id number] Adm. Date:  08/23/00 Disc. Date: 08/24/00 Attending:  Stacie Glaze, M.D. [facility] CC:         Dineen Kid. Reche Dixon, M.D.  Outpatient Clinic [facility]   Discharge Summary  ADMITTING DIAGNOSIS:  Hyperosmolar hyperglycemia, nonketotic.  DISCHARGE DIAGNOSES: 1. New onset adult onset diabetes, type 2. 2. Hypertension.  HOSPITAL COURSE:  The patient was admitted to the general medicine service as an unassigned patient at [facility] when he presented with increased thirst, fatigue, increased appetite, and polyuria.  The patient in the emergency room was found to have a blood glucose greater than 700, but he was nonketotic and nonacidotic.  He was given a liter of normal saline in the emergency room with a 200 point drop in his blood glucose without insulin.  He was placed on the Glucommander overnight and quickly resolved with three liters of IV hydration and insulin, with insulin discontinued to 4 a.m.  This morning, his blood sugars remain within normal limits with sliding scale insulin only and adult diabetic 1800 ADA diet.  He will be discharged to home today with Amaryl 2 mg p.o. q.d.  This medication was chosen for cost.  He will be given a prescription for 4 mg and told to take 1/2 of a tablet a day since he has no pharmacy benefits.  He is currently on Captopril and hydrochlorothiazide through the outpatient clinic for his hypertension.  He will continue those medications and I do recommend that he continue an ACE inhibitor because of the diabetes.  It should be noted that this patient has risk factors of adult onset diabetes, including a family history of diabetes in his mother, grandmother, and an aunt.  DISCHARGE PLANNING:  The patient was given initial diabetic education by diabetic teaching here at [facility].  He was given a glucometer  and instructed to check his fasting glucose each day and record it and a random 2-hour postprandial several times a week and record those.  He was given goals to keep his glucose between 80 and 120 and was given diet and exercise parameters.  He will be set up for outpatient diabetic teaching and is pointed to the outpatient clinic, Dr. Reche Dixon, Wednesday at 11:15 a.m.  He is discharged in markedly improved condition with resolution of his polyuria, polydipsia, and polyphagia. DD:  08/24/00 TD:  08/24/00 Job: [id number]

## 2010-06-14 NOTE — Assessment & Plan Note (Signed)
[facility]                              CARDIOLOGY OFFICE NOTE   [name], [name]                      MRN:          [id number]  DATE:10/22/2005                            DOB:          11/14/1955    REASON FOR PRESENTATION:  Evaluate patient with hypertension cardiomyopathy.   HISTORY OF PRESENT ILLNESS:  The patient is a pleasant 55 year old gentleman  who presents for followup of the above problems.  We have had long  discussions about the need for medical compliance.  He seems like he is  adhering to the current regimen.  He has had no problems with this.  He had  his blood pressure checked since I last saw him and it was reasonable.  He  has had a B-MET which was normal.  He denies any new shortness of breath.  He has class I symptoms.  He denies any palpitations, presyncope, or  syncope.  He has had no chest pain.   PAST MEDICAL HISTORY:  1. Cardiomyopathy.  2. Nonobstructive coronary disease.  3. Hypertension.  4. Diabetes mellitus (diet controlled).  5. Mild aortic sclerosis.  6. Hernia repair.   ALLERGIES:  NONE.   MEDICATIONS:  1. Benazepril 20 mg a day.  2. Hydrochlorothiazide 12.5 mg a day.  3. Aspirin 81 mg a day.  4. Nicardipine 20 mg t.i.d.   REVIEW OF SYSTEMS:  As stated in the HPI and otherwise negative for other  systems.   PHYSICAL EXAMINATION:  GENERAL:  The patient is in no distress.  VITAL SIGNS:  Blood pressure 162/98, heart rate 48 and regular, weight 188  pounds, body mass index 32.  HEENT:  Eyes unremarkable.  Pupils are equal, round, and reactive to light.  Fundi not visualized.  NECK:  No jugular venous distention at 45 degrees.  Carotid upstroke brisk  and symmetric.  No bruits.  No thyromegaly.  LYMPHATICS:  No adenopathy.  LUNGS:  Clear to auscultation bilaterally.  BACK:  No costovertebral angle tenderness.  CHEST:  Unremarkable.  HEART:  PMI not displaced or sustained.  S1 and S2 within normal  limits.  No  S3, no S4.  A 2/6 apical brief early systolic murmur.  No diastolic murmurs.  ABDOMEN:  Obese.  Positive bowel sounds, normal in frequency and pitch.  No  bruits.  No rebound.  No guarding.  No midline pulsatile mass.  No  organomegaly.  SKIN:  No rashes.  No nodules.  EXTREMITIES:  With 2+ pulses.  No edema.   ASSESSMENT/PLAN:  1. Hypertension.  He seems to be more compliant that he was previously.  I      am going to increase his benazepril to 40 mg a day.  He will get a B-      MET in two weeks.  2. Cardiomyopathy.  He is having class I symptoms.  We will continue with      the medicine titration.  3. Premature ventricular contractions.  He is having frequent premature      ventricular contractions.  He would be low risk for sudden cardiac  death in the absence of higher than class I symptoms.  His EF is      probably 35-40%.  We will follow this up in the future with probably a      Holter and a reassessment of his ejection fraction, though to further      risk stratify.  He needs to let me know if he has any presyncope or      syncope going forward.   FOLLOWUP:  1. We will see him back in 6 weeks or sooner if needed.  2. We will refer him to the heart failure clinic.            ______________________________  Rollene Rotunda, MD, [facility]     JH/MedQ  DD:  10/22/2005  DT:  10/24/2005  Job #:  [id number]

## 2010-06-14 NOTE — Assessment & Plan Note (Signed)
[facility]                            CARDIOLOGY OFFICE NOTE   RENWICK, ASMAN                      MRN:          [id number]  DATE:03/16/2006                            DOB:          05/05/1955    PRIMARY:  None.   REASON FOR PRESENTATION:  Evaluate the patient with hypertension and  cardiomyopathy.   HISTORY OF PRESENT ILLNESS:  The patient returns for followup.  He has  been doing well since I last saw him.  He has not had any difficulty  with initiation of hydrochlorothiazide.  In particular, he had some  impotence; however, this did not get worse and, in fact, got better.  This may have been helped by the fact that he just got married since I  last saw him.  He denies any palpitations, presyncope, or syncope.  He  has had no chest discomfort, neck discomfort, arm discomfort, activity  induced nausea, vomiting, excessive diaphoresis.   PAST MEDICAL HISTORY:  1. Hypertension.  2. Cardiomyopathy (EF approximately 40%).  3. Nonobstructive coronary disease (first diagonal 25% stenosed, ramus      intermediate 25% stenosis).   ALLERGIES:  None.   MEDICATIONS:  1. Hydrochlorothiazide 12.5 mg daily.  2. Aspirin 81 mg daily.  3. Benazepril 40 mg daily.  4. Nicardipine 40 mg t.i.d.  5. Spironolactone 25 mg daily.   REVIEW OF SYSTEMS:  As stated in the HPI and otherwise negative for  other systems.   PHYSICAL EXAMINATION:  The patient is in no distress.  Blood pressure is 152/88.  Heart rate 74 and regular.  Weight 190  pounds.  Body mass index 31.  HEENT:  Eyes unremarkable.  Pupils equally round and reactive to light.  Fundi not visualized.  Oral mucosa is unremarkable.  NECK:  No jugular venous distention, waveform within normal limits,  carotid upstrokes brisk and symmetric, no bruits, no thyromegaly.  LYMPHATIC:  No cervical, axillary, or inguinal adenopathy.  LUNGS:  Clear to auscultation bilaterally.  BACK:  No costovertebral  angle tenderness.  CHEST:  Unremarkable.  HEART:  PMI not displaced or sustained, S1 and S2 within normal limits,  no S3, no S4, no clicks, no rubs, no murmurs.  ABDOMEN:  Flat, positive bowel sounds, normal in frequency and pitch, no  bruits, no rebound, no guarding, no midline pulsatile masses, no  hepatomegaly, no splenomegaly.  SKIN:  No rashes, no nodules.  EXTREMITIES:  2+ pulses throughout, no edema.  No cyanosis, no clubbing.  NEUROLOGIC: Oriented to person, place, and time.  Cranial nerves II  through XII grossly intact.  Motor grossly intact throughout.   EKG sinus rhythm, rate 74, left axis deviation, left anterior vesicular  block, left ventricular hypertrophy by voltage criteria, premature  ventricular contractions.   ASSESSMENT AND PLAN:  1. Hypertension.  The patient's blood pressure is better controlled      than it had been.  He is tolerating the current regimen.  I am      going to increase his spironolactone to 50 mg daily.  He is going      to  get a BMET today and then again in 2 weeks.  2. Cardiomyopathy.  We will repeat his echocardiogram in the near      future to see if we have had any improvement in his ejection      fraction.  3. Nonobstructive coronary disease.  He will continue with primary      risk reduction.  4. Followup. I would like to see him back in 2 months though he will      come back again in 2 weeks for his blood work as described.     Rollene Rotunda, MD, [facility]  Electronically Signed    JH/MedQ  DD: 03/16/2006  DT: 03/16/2006  Job #: [id number]

## 2010-06-14 NOTE — Assessment & Plan Note (Signed)
[facility]                              CARDIOLOGY OFFICE NOTE   [name], QUAST                      MRN:          [id number]  DATE:11/28/2005                            DOB:          Jun 18, 1955    PRIMARY CARE PHYSICIAN:  None.   REASON FOR PRESENTATION:  Patient with hypertension, cardiomyopathy.   HISTORY OF PRESENT ILLNESS:  The patient now returns for follow up. He  tolerated this well. He did not have any light headedness, presyncope or  syncope. He had no palpitations. He had no chest pain. He has had no  shortness of breath. He did not come and get his BMET done.  He is now able  to afford the medications we are prescribing. He is taking his t.i.d. drug  and others religiously.   PAST MEDICAL HISTORY:  Cardiomyopathy, nonobstructive coronary disease,  hypertension, diabetes mellitus, diet controlled, mild aortic stenosis,  hernia repair.   ALLERGIES:  None.   CURRENT MEDICATIONS:  1. Benazepril 40 mg daily.  2. Hydrochlorothiazide 12.5 mg daily.  3. Aspirin 81 mg daily.  4. Nicardipine 20 mg t.i.d.   REVIEW OF SYSTEMS:  As stated in the HPI, otherwise negative for other  systems.   PHYSICAL EXAMINATION:  GENERAL:  The patient is in no distress.  VITAL SIGNS: Blood pressure 160/88, heart rate 79 and regular, weight 189  pounds, body mass index 32.  NECK: No jugular venous distension at 45 degrees, carotid upstrokes brisk  and symmetrical, no bruits, no thyromegaly.  LUNGS: Clear to auscultation bilaterally.  HEART:  The PMI is not displaced, sustained, S1 and S2 within normal limits.  No S3, no S4 or murmur.  ABDOMEN:  Obese, positive bowel sounds, normal in frequency and pitch. No  bruits, no rebound, guarding, no midline pulsatile mass, no organomegaly.  SKIN: No rashes.  EXTREMITIES: Pulses 2+, no edema.   EKG sinus rhythm, ventricular bigeminy, left anterior fascicular block, left  axis deviation, left ventricular  hypertrophy, no change from previous EKG.   ASSESSMENT AND PLAN:  1. Hypertension - Stable. Increase his Nicardipine to 40 mg t.i.d. He will      get a BMET today. He will continue other medications as listed. We are      getting much closer to a target blood pressure.  2. Cardiomyopathy. We are managing this with control of his blood pressure      but he has class I symptoms.  3. Premature ventricular contractions - This is bigeminy but he is not      noticing these. No further evaluation is warranted at this point.  4. Follow up. He will come back in 2 months.    ______________________________  Rollene Rotunda, MD, [facility]    JH/MedQ  DD: 11/28/2005  DT: 11/28/2005  Job #: [id number]

## 2010-06-14 NOTE — Assessment & Plan Note (Signed)
[name]                            CARDIOLOGY OFFICE NOTE   [name], [name]                      MRN:          [id number]  DATE:09/25/2006                            DOB:          1955-10-12    PRIMARY:  None.   REASON FOR PRESENTATION:  Evaluate the patient with hypertension and  cardiomyopathy.   HISTORY OF PRESENT ILLNESS:  The patient is 55 years old.  He presents  for followup of the above.  He has been doing relatively well since I  last saw him.  He has not been having any new shortness of breath and  denies any PND or orthopnea.  He has not been having any chest  discomfort, neck or arm discomfort.  He has not been having any  palpitations, pre-syncope, or syncope.  He has been having no PND or  orthopnea.   PAST MEDICAL HISTORY:  Hypertension.  Cardiomyopathy (EF approximately  40%).  Nonobstructive coronary artery disease (first diagonal 25%  stenosed, ramus intermedius 25% stenosed).   ALLERGIES:  None.   MEDICATIONS:  1. Hydrochlorothiazide 12.5 mg daily.  2. Aspirin 81 mg daily.  3. Benazepril 40 mg daily.  4. Spironolactone 50 mg daily.  5. Nicardipine 20 mg b.i.d.   REVIEW OF SYSTEMS:  As stated in the HPI and otherwise negative for  other systems.   PHYSICAL EXAMINATION:  The patient is in no distress.  Blood pressure 158/74, heart rate 76 and regular.  HEENT:  Eyelids unremarkable.  Pupils are equal, round, and reactive to  light and accommodation.  Fundi are not visualized.  Oral mucosa  unremarkable.  NECK:  No jugular venous distension at 45 degrees, carotid upstroke  brisk and symmetric, no bruits, thyromegaly.  LYMPHATICS:  No adenopathy.  LUNGS:  Clear to auscultation bilaterally.  BACK:  No costovertebral angle tenderness.  CHEST:  Unremarkable.  HEART:  PMI not displaced or sustained, S1 and S2 within normal limits,  no S3, no murmurs.  ABDOMEN:  Obese, positive bowel sounds, normal in frequency and  pitch,  no bruits, rebound, guarding.  No midline pulsatile masses,  hepatomegaly, splenomegaly.  SKIN:  No rashes, no nodules.  EXTREMITIES:  With 2+ pulses throughout, no edema.   ELECTROCARDIOGRAM:  Sinus rhythm.  Left axis deviation.  Left  ventricular hypertrophy.  Premature ventricular contractions in a  bigeminal pattern.  No acute ST-T wave changes.   ASSESSMENT AND PLAN:  1. Hypertension.  The patient's blood pressure is still somewhat      difficult to control.  I am going to increase his nicardipine to 30      mg t.i.d.  He will continue on the other medications as listed.  I      will make sure to get a BMET today on the spironolactone.  2. Cardiomyopathy.  This will be managed in the context of treating      his hypertension.  3. Followup.  I will see the patient again in about 4 weeks or sooner      if needed.     Rollene Rotunda, MD, [facility]  Electronically Signed    JH/MedQ  DD: 09/25/2006  DT: 09/26/2006  Job #: [id number]

## 2010-06-14 NOTE — Assessment & Plan Note (Signed)
[facility]                              CARDIOLOGY OFFICE NOTE   Bradley, [name]                      MRN:          [id number]  DATE:08/26/2005                            DOB:          September 30, 1955    REASON FOR PRESENTATION:  Hypertension, cardiomyopathy.   HISTORY OF PRESENT ILLNESS:  The patient presents for follow up of the  above.  He is now 55 years old.  Unfortunately, he continues to be  noncompliant with his medications, partly due to finances.  He actually had  to present to the emergency room a few days ago because of a hypertensive  urgency.  He was managed with Clonidine and sent home.  He has not been  currently taking his Nifedical.  He did get his Benazepril and  hydrochlorothiazide filled.  He has not been having any symptoms.  In  particular, he is not having any shortness of breath and has no  palpitations, pre-syncope or syncope.  He has no chest pain, neck  discomfort, arm discomfort, activity-induced nausea or vomiting, or  excessive diaphoresis.   PAST MEDICAL HISTORY:  Cardiomyopathy, mild non-obstructive coronary  disease, hypertension, diabetes mellitus (he says that this was borderline  and has been diet controlled), mild  aortic sclerosis, hernia repair.   ALLERGIES:  None.   MEDICATIONS:  1.  Benazepril 20 mg q. day.  2.  Hydrochlorothiazide 12.5 mg q. day.  3.  Nifedical 16 mg, q. day, (he is not taking this).  4.  Aspirin 81 mg a day.  5.  Potassium 20 mEq q. day (he has been out of this).   REVIEW OF SYSTEMS:  As stated in the HPI.  Otherwise negative other systems.   PHYSICAL EXAMINATION:  GENERAL:  The patient is in no distress.  VITAL SIGNS:  Blood pressure 200/96, heart rate 76 and regular, weight 188  pounds, body mass index 32.  HEENT:  Eyelids unremarkable; pupils equal and round reactive to light,  fundi not visualized, oral mucosa unremarkable.  NECK:  No jugular venous distention.  Waveform  within normal limits.  Carotid upstroke brisk and symmetric, no bruits, no thyromegaly.  LYMPHATICS:  No cervical, axillary of inguinal adenopathy.  LUNGS:  Clear to auscultation bilaterally.  BACK:  No costovertebral angle tenderness.  CHEST:  Unremarkable.  HEART:  PMI displaced or sustained, S1, S2 within normal limits, no S3, no  S4, 2/6 apical brief early systolic murmur, no diastolic murmurs.  ABDOMEN:  Obese, positive bowel sounds, normal infrequency and pitch, no  bruits, no rebound, no guarding or pulsatile mass, no organomegaly.  SKIN:  No rashes, benign.  EXTREMITIES:  2+ pulses.  No edema.   ASSESSMENT/PLAN:  1.  Hypertension.  I had a long discussion with the patient today.  I told      him he is an extremely high risk of stroke if he does not become      compliant with his medications.  I have written for Nicardipine 20 mg      t.i.d., which he can get for $4.00 at [facility].  He can afford  the      Benazepril and hydrochlorothiazide.  He has also been given a new      prescription for potassium, which he was not taking.  He is going to      come back in about 10 days for a blood pressure check.  Hopefully, he      will be compliant with his medications going forward.  2.  Cardiomyopathy.  He is currently having Class 1 symptoms.  Management is      going to center around control of his hypertension.  3.  Followup.  He will come back for the blood pressure check.  I will see      him back in about two months.  We may need to check his blood pressure      frequently before then.                               Rollene Rotunda, MD, [facility]    JH/MedQ  DD:  08/26/2005  DT:  08/26/2005  Job #:  [id number]

## 2010-06-14 NOTE — Assessment & Plan Note (Signed)
[facility]                            CARDIOLOGY OFFICE NOTE   [name], [name]                      MRN:          [id number]  DATE:02/12/2006                            DOB:          1955-03-02    PRIMARY:  None.   REASON FOR PRESENTATION:  Evaluate patient with hypertension and  cardiomyopathy.   HISTORY OF PRESENT ILLNESS:  The patient presents for followup.  At the  last visit, I increased his nicardipine to 40 mg t.i.d.  He has had no  problems and he says he is very compliant taking his medications.  He  denies any chest pressure, neck discomfort, arm discomfort, activity  induced nausea, vomiting, excessive diaphoresis.  He has had no  palpitations, presyncope, or syncope.  He denies any PND or orthopnea.   PAST MEDICAL HISTORY:  Hypertension.   1. __________ 12.5 mg daily.  2. Aspirin 81 mg daily.  3. Benazepril 40 mg daily.  4. Nicardipine 40 mg t.i.d.   He has been out of his potassium.   REVIEW OF SYSTEMS:  As stated in the HPI and otherwise negative for  other systems.   PHYSICAL EXAMINATION:  The patient is in no distress.  Blood pressure 170/81.  Heart rate 60 and regular.  Body mass index 31.  Weight 187 pounds.  HEENT:  Eyes unremarkable.  Pupils equal, round, and reactive to light.  Fundi not visualized.  Oral mucosa unremarkable.  NECK:  No jugular venous distention at 45 degrees, carotid upstrokes  brisk and symmetrical.  No bruits.  No thyromegaly.  LYMPHATICS:  No adenopathy.  LUNGS:  Clear to auscultation bilaterally.  BACK:  No costovertebral angle tenderness.  CHEST:  Unremarkable.  HEART:  PMI not displaced or sustained, S1 and S2 within normal limits,  no S3, no S4, no murmurs.  ABDOMEN:  Flat, positive bowel sounds, normal in frequency and pitch, no  bruits, no rebound, no guarding, no midline pulsatile masses, no  organomegaly.  SKIN:  No rashes, no nodules.  EXTREMITIES:  2+ pulses, no edema.   ASSESSMENT AND PLAN:  1. Hypertension.  His blood pressure is still not well controlled.      Now, I am going to add spironolactone 25 mg daily.  We talked about      hyperkalemia.  He understands the need to get blood work checked.      He has not been high before.  He has a mildly elevated      creatinine.  We will watch this carefully.  He is going to come      back in 7 days for a BMET.  2. Followup.  I will see him back in 4 weeks or sooner if needed.     Rollene Rotunda, MD, [facility]  Electronically Signed    JH/MedQ  DD: 02/12/2006  DT: 02/12/2006  Job #: [id number]

## 2010-06-17 ENCOUNTER — Encounter: Payer: Self-pay | Admitting: Cardiology

## 2010-10-16 ENCOUNTER — Other Ambulatory Visit: Payer: Self-pay | Admitting: Internal Medicine

## 2010-10-16 ENCOUNTER — Other Ambulatory Visit: Payer: Self-pay | Admitting: Cardiology

## 2010-10-29 LAB — GLUCOSE, CAPILLARY: Glucose-Capillary: 95

## 2011-01-08 ENCOUNTER — Other Ambulatory Visit: Payer: Self-pay

## 2011-01-08 ENCOUNTER — Emergency Department (HOSPITAL_COMMUNITY)
Admission: EM | Admit: 2011-01-08 | Discharge: 2011-01-08 | Disposition: A | Discharge status: Home / Self Care | Payer: BC Managed Care – PPO | Attending: Emergency Medicine | Admitting: Emergency Medicine

## 2011-01-08 ENCOUNTER — Emergency Department (HOSPITAL_COMMUNITY): Payer: BC Managed Care – PPO

## 2011-01-08 ENCOUNTER — Encounter (HOSPITAL_COMMUNITY): Payer: Self-pay | Admitting: Emergency Medicine

## 2011-01-08 DIAGNOSIS — R059 Cough, unspecified: Secondary | ICD-10-CM | POA: Insufficient documentation

## 2011-01-08 DIAGNOSIS — I428 Other cardiomyopathies: Secondary | ICD-10-CM | POA: Insufficient documentation

## 2011-01-08 DIAGNOSIS — I10 Essential (primary) hypertension: Secondary | ICD-10-CM

## 2011-01-08 DIAGNOSIS — E119 Type 2 diabetes mellitus without complications: Secondary | ICD-10-CM | POA: Insufficient documentation

## 2011-01-08 DIAGNOSIS — I2510 Atherosclerotic heart disease of native coronary artery without angina pectoris: Secondary | ICD-10-CM | POA: Insufficient documentation

## 2011-01-08 DIAGNOSIS — R05 Cough: Secondary | ICD-10-CM | POA: Insufficient documentation

## 2011-01-08 DIAGNOSIS — R0789 Other chest pain: Secondary | ICD-10-CM | POA: Insufficient documentation

## 2011-01-08 DIAGNOSIS — R0602 Shortness of breath: Secondary | ICD-10-CM | POA: Insufficient documentation

## 2011-01-08 LAB — COMPREHENSIVE METABOLIC PANEL
ALT: 25 U/L (ref 0–53)
AST: 31 U/L (ref 0–37)
Albumin: 3.5 g/dL (ref 3.5–5.2)
Alkaline Phosphatase: 98 U/L (ref 39–117)
BUN: 16 mg/dL (ref 6–23)
CO2: 24 mEq/L (ref 19–32)
Calcium: 9.1 mg/dL (ref 8.4–10.5)
Chloride: 104 mEq/L (ref 96–112)
Creatinine, Ser: 1.17 mg/dL (ref 0.50–1.35)
GFR calc Af Amer: 79 mL/min — ABNORMAL LOW (ref >90)
GFR calc non Af Amer: 68 mL/min — ABNORMAL LOW (ref >90)
Glucose, Bld: 111 mg/dL — ABNORMAL HIGH (ref 70–99)
Potassium: 3.7 mEq/L (ref 3.5–5.1)
Sodium: 136 mEq/L (ref 135–145)
Total Bilirubin: 0.5 mg/dL (ref 0.3–1.2)
Total Protein: 7.1 g/dL (ref 6.0–8.3)

## 2011-01-08 LAB — DIFFERENTIAL
Basophils Absolute: 0 10*3/uL (ref 0.0–0.1)
Basophils Relative: 0 % (ref 0–1)
Eosinophils Absolute: 0 10*3/uL (ref 0.0–0.7)
Eosinophils Relative: 1 % (ref 0–5)
Lymphocytes Relative: 19 % (ref 12–46)
Lymphs Abs: 0.9 10*3/uL (ref 0.7–4.0)
Monocytes Absolute: 0.7 10*3/uL (ref 0.1–1.0)
Monocytes Relative: 15 % — ABNORMAL HIGH (ref 3–12)
Neutro Abs: 3.2 10*3/uL (ref 1.7–7.7)
Neutrophils Relative %: 66 % (ref 43–77)

## 2011-01-08 LAB — CBC
HCT: 38.9 % — ABNORMAL LOW (ref 39.0–52.0)
Hemoglobin: 13.2 g/dL (ref 13.0–17.0)
MCH: 29.3 pg (ref 26.0–34.0)
MCHC: 33.9 g/dL (ref 30.0–36.0)
MCV: 86.3 fL (ref 78.0–100.0)
Platelets: 293 10*3/uL (ref 150–400)
RBC: 4.51 MIL/uL (ref 4.22–5.81)
RDW: 13.1 % (ref 11.5–15.5)
WBC: 4.9 10*3/uL (ref 4.0–10.5)

## 2011-01-08 LAB — TROPONIN I
Troponin I: <0.3 ng/mL (ref <0.30)
Troponin I: <0.3 ng/mL (ref <0.30)

## 2011-01-08 LAB — POCT I-STAT TROPONIN I: Troponin i, poc: 0.1 ng/mL (ref 0.00–0.08)

## 2011-01-08 LAB — D-DIMER, QUANTITATIVE: D-Dimer, Quant: 0.34 ug/mL-FEU (ref 0.00–0.48)

## 2011-01-08 LAB — LIPASE, BLOOD: Lipase: 23 U/L (ref 11–59)

## 2011-01-08 MED ORDER — ALBUTEROL SULFATE HFA 108 (90 BASE) MCG/ACT IN AERS
2.0000 | INHALATION_SPRAY | RESPIRATORY_TRACT | Status: DC | PRN
  Filled 2011-01-08: qty 6.7

## 2011-01-08 MED ORDER — PREDNISONE 20 MG PO TABS: 60.0000 mg | ORAL_TABLET | Freq: Every day | ORAL | Status: AC

## 2011-01-08 MED ORDER — IPRATROPIUM BROMIDE 0.02 % IN SOLN
0.5000 mg | Freq: Once | RESPIRATORY_TRACT | Status: AC
  Administered 2011-01-08: 0.5 mg via RESPIRATORY_TRACT
  Filled 2011-01-08: qty 2.5

## 2011-01-08 MED ORDER — ACETAMINOPHEN 325 MG PO TABS: 650.0000 mg | ORAL_TABLET | Freq: Once | ORAL | Status: DC

## 2011-01-08 MED ORDER — NITROGLYCERIN 0.4 MG SL SUBL
0.4000 mg | SUBLINGUAL_TABLET | SUBLINGUAL | Status: DC | PRN
  Administered 2011-01-08: 0.4 mg via SUBLINGUAL
  Filled 2011-01-08: qty 25

## 2011-01-08 MED ORDER — LABETALOL HCL 5 MG/ML IV SOLN
20.0000 mg | Freq: Once | INTRAVENOUS | Status: AC
  Administered 2011-01-08: 20 mg via INTRAVENOUS
  Filled 2011-01-08: qty 4

## 2011-01-08 MED ORDER — ALBUTEROL SULFATE HFA 108 (90 BASE) MCG/ACT IN AERS
2.0000 | INHALATION_SPRAY | Freq: Once | RESPIRATORY_TRACT | Status: AC
  Administered 2011-01-08: 2 via RESPIRATORY_TRACT
  Filled 2011-01-08: qty 6.7

## 2011-01-08 MED ORDER — PREDNISONE 20 MG PO TABS
60.0000 mg | ORAL_TABLET | Freq: Once | ORAL | Status: AC
  Administered 2011-01-08: 60 mg via ORAL
  Filled 2011-01-08: qty 3

## 2011-01-08 MED ORDER — ALBUTEROL SULFATE (5 MG/ML) 0.5% IN NEBU
2.5000 mg | INHALATION_SOLUTION | Freq: Once | RESPIRATORY_TRACT | Status: AC
  Administered 2011-01-08: 2.5 mg via RESPIRATORY_TRACT
  Filled 2011-01-08: qty 0.5

## 2011-01-08 MED ORDER — ASPIRIN 81 MG PO CHEW
324.0000 mg | CHEWABLE_TABLET | Freq: Once | ORAL | Status: AC
  Administered 2011-01-08: 324 mg via ORAL
  Filled 2011-01-08: qty 4

## 2011-01-08 NOTE — ED Notes (Signed)
RT called for breathing tx. 

## 2011-01-08 NOTE — ED Notes (Signed)
Pt states he has been sick for about the past 3 weeks  Pt states it started with a cough and shortness of breath   Pt states shortness of breath worse with exertion  Pt states he had diarrhea this morning and throughout the day he has felt progressively worse and his breathing feels like it is getting worse too

## 2011-01-08 NOTE — ED Provider Notes (Signed)
History     CSN: [id number] Arrival date & time: 01/08/2011  2:42 AM   First MD Initiated Contact with Patient 01/08/11 0500      Chief Complaint  Patient presents with  . Shortness of Breath    (Consider location/radiation/quality/duration/timing/severity/associated sxs/prior treatment) HPI Patient is a 55 year old diabetic male who presents today complaining of shortness of breath. He's been having cough and shortness of breath for 3 weeks but noted acute worsening yesterday at 3:30 PM. Patient is to get back upon arrival. He is maintaining his oxygen saturation. Patient has a history of asthma but states that this feels different. He denies any chest pain. Patient denies any fevers. He has no GI or GU symptoms. Patient denies worsening of his symptoms with deep breaths. He does have family history of blood clots but no personal history of this. Patient had cardiac catheterization performed in 2005 with no disease noted. Patient had stress test in April 2011 with possible septal apical ischemia. Patient's findings at that time her thought to be consistent with nonischemic cardiomyopathy. Patient is worse with movement and better with rest. There are no other associated or modifying factors.   Past Medical History  Diagnosis Date  . HTN (hypertension)   . Cardiomyopathy   . CAD (coronary artery disease)   . DM2 (diabetes mellitus, type 2)     Past Surgical History  Procedure Date  . Cardiac catheterization     Family History  Problem Relation Age of Onset  . Peripheral vascular disease    . Hypertension    . Prostate cancer      History  Substance Use Topics  . Smoking status: Never Smoker   . Smokeless tobacco: Not on file  . Alcohol Use: Yes     rare      Review of Systems  Constitutional: Negative.   HENT: Negative.   Eyes: Negative.   Respiratory: Positive for cough and shortness of breath.   Cardiovascular: Negative.   Gastrointestinal: Negative.     Genitourinary: Negative.   Musculoskeletal: Negative.   Skin: Negative.   Neurological: Negative.   Hematological: Negative.   Psychiatric/Behavioral: Negative.   All other systems reviewed and are negative.    Allergies  Review of patient's allergies indicates no known allergies.  Home Medications   Current Outpatient Rx  Name Route Sig Dispense Refill  . AMLODIPINE BESYLATE 5 MG PO TABS Oral Take 1 tablet (5 mg total) by mouth daily. 30 tablet 11  . ASPIRIN 81 MG PO TABS Oral Take 81 mg by mouth daily.      [name] BENAZEPRIL HCL 40 MG PO TABS Oral Take 1 tablet (40 mg total) by mouth daily. 30 tablet 11  . GLIMEPIRIDE 2 MG PO TABS  TAKE ONE TABLET BY MOUTH DAILY BEFORE BREAKFAST 30 tablet 1    Overdue for yearly physical must see md before add ...  . HYDROCHLOROTHIAZIDE 12.5 MG PO CAPS Oral Take 12.5 mg by mouth daily.      [name] LIPITOR 20 MG PO TABS  TAKE ONE TABLET BY MOUTH EVERY DAY 30 each 6  . METFORMIN HCL 500 MG PO TABS Oral Take 500 mg by mouth 2 (two) times daily with a meal.      . SILDENAFIL CITRATE 50 MG PO TABS Oral Take 50 mg by mouth daily as needed.      [name] SPIRONOLACTONE 25 MG PO TABS  TAKE TWO TABLETS BY MOUTH EVERY DAY 60 tablet 6  BP 145/94  Pulse 89  Temp(Src) 99.5 F (37.5 C) (Oral)  Resp 24  SpO2 99%  Physical Exam  Nursing note and vitals reviewed. Constitutional: He is oriented to person, place, and time. He appears well-developed and well-nourished. He appears distressed.  HENT:  Head: Normocephalic and atraumatic.  Eyes: Conjunctivae and EOM are normal. Pupils are equal, round, and reactive to light.  Neck: Normal range of motion.  Cardiovascular: Normal rate, regular rhythm, normal heart sounds and intact distal pulses.  Exam reveals no gallop and no friction rub.   No murmur heard. Pulmonary/Chest: Breath sounds normal. He has no wheezes. He has no rales. He exhibits no tenderness.       Tachypnea   Abdominal: Soft. Bowel sounds are normal. He  exhibits no distension. There is no tenderness. There is no rebound and no guarding.  Musculoskeletal: Normal range of motion. He exhibits no edema.  Neurological: He is alert and oriented to person, place, and time. No cranial nerve deficit. He exhibits normal muscle tone. Coordination normal.  Skin: Skin is warm and dry. No rash noted. He is not diaphoretic.  Psychiatric: He has a normal mood and affect.    ED Course  Procedures (including critical care time)  Date: 01/08/2011  Rate: 87  Rhythm: normal sinus rhythm and premature ventricular contractions (PVC)  QRS Axis: normal  Intervals: normal  ST/T Wave abnormalities: nonspecific ST/T changes  Conduction Disutrbances:nonspecific intraventricular conduction delay  Narrative Interpretation:   Old EKG Reviewed: Patient no longer with a pattern of bigeminy. He does have T-wave inversions noted in leads 12, and V3 through V6. These were not seen on previous EKG.   Labs Reviewed  CBC - Abnormal; Notable for the following:    HCT 38.9 (*)    All other components within normal limits  DIFFERENTIAL - Abnormal; Notable for the following:    Monocytes Relative 15 (*)    All other components within normal limits  COMPREHENSIVE METABOLIC PANEL - Abnormal; Notable for the following:    Glucose, Bld 111 (*)    GFR calc non Af Amer 68 (*)    GFR calc Af Amer 79 (*)    All other components within normal limits  POCT I-STAT TROPONIN I - Abnormal; Notable for the following:    Troponin i, poc 0.10 (*)    All other components within normal limits  LIPASE, BLOOD  I-STAT TROPONIN I  TROPONIN I  D-DIMER, QUANTITATIVE   Dg Chest 2 View  01/08/2011  *RADIOLOGY REPORT*  Clinical Data: Left chest tightness, cough, shortness of breath, and wheezing  CHEST - 2 VIEW  Comparison: 09/09/2009  Findings: Normal heart size and pulmonary vascularity.  Central peribronchial thickening suggesting changes of chronic bronchitis. No focal airspace  consolidation in the lungs.  No blunting of costophrenic angles.  No pneumothorax.  No significant change since previous study.  IMPRESSION: No evidence of active pulmonary disease.  Original Report Authenticated By: Marlon Pel, M.D.     1. Shortness of breath       MDM  Patient was evaluated by myself. Patient had shortness of breath but given his history of diabetes as well as prior cardiac cath patient did have cardiac workup initiated. He was given a DuoNeb for his breathing symptoms. With this the patient had resolution. He did have an EKG that showed T-wave inversions compared to previous but no other obvious changes. Initial point-of-care troponin returned at 0.10. Chest x-ray was negative. Patient had complained  of some nausea and CMP as well as lipase and CBC were unremarkable. I spoke with Dr. Jens Som from [facility] cardiology given that they have seen the patient previously. He was comfortable with me continuing to pursue evaluation the patient and possible admission to hospitalist if necessary. Lab troponin as well d-dimer were ordered. Patient was given aspirin 324 mg as well as 1 tab of nitroglycerin. Patient has no discomfort or shortness of breath at this time.    Lab troponin as well as d-dimer were negative. Patient has repeat three-hour troponin pending for 9 AM. Since receiving meds he is completely asymptomatic. Patient was given a dose of prednisone 60 mg by mouth here as well as an albuterol inhaler. Patient will be discharged home with prescription for prednisone and followup with his doctor in one week. If there are any changes to his disposition please see note from disposition provider. Otherwise patient will be discharged home in good condition.        Cyndra Numbers, MD 01/08/11 [phone]

## 2011-01-08 NOTE — ED Notes (Signed)
Lab at bedside for additional blood draw

## 2011-01-08 NOTE — ED Notes (Signed)
Patient left phone charger in room upon discharge. Patient contacted at [phone] and told to come pick it up at security in the ed

## 2011-01-08 NOTE — ED Notes (Signed)
MD at bedside. 

## 2011-01-08 NOTE — ED Provider Notes (Signed)
Pt seen after his second TNI came back normal.  He has had cough and shortness of breath.  His chest x-ray showed no pneumonia.  He is being released with a prescription for prednisone and for an albuterol HFA.  No work for 3 days.    Carleene Cooper III, MD 01/08/11 [phone]

## 2011-02-03 ENCOUNTER — Telehealth: Payer: Self-pay | Admitting: *Deleted

## 2011-02-03 MED ORDER — ALBUTEROL SULFATE HFA 108 (90 BASE) MCG/ACT IN AERS: 2.0000 | INHALATION_SPRAY | Freq: Four times a day (QID) | RESPIRATORY_TRACT | Status: DC | PRN

## 2011-02-03 NOTE — Telephone Encounter (Signed)
Pt fill-out walk-in slip needing refill on albuterol inhaler that was rx at the hospital. Have f/u appt schedule to see md on 02/12/11. Inform pt rx sent to [facility] pharmacy...02/03/11@4 :32pm/LMB

## 2011-02-08 ENCOUNTER — Emergency Department (HOSPITAL_COMMUNITY): Payer: BC Managed Care – PPO

## 2011-02-08 ENCOUNTER — Encounter (HOSPITAL_COMMUNITY): Payer: Self-pay | Admitting: *Deleted

## 2011-02-08 ENCOUNTER — Emergency Department (HOSPITAL_COMMUNITY)
Admission: EM | Admit: 2011-02-08 | Discharge: 2011-02-08 | Disposition: A | Discharge status: Home / Self Care | Payer: BC Managed Care – PPO | Attending: Emergency Medicine | Admitting: Emergency Medicine

## 2011-02-08 DIAGNOSIS — R059 Cough, unspecified: Secondary | ICD-10-CM | POA: Insufficient documentation

## 2011-02-08 DIAGNOSIS — J45909 Unspecified asthma, uncomplicated: Secondary | ICD-10-CM | POA: Insufficient documentation

## 2011-02-08 DIAGNOSIS — I10 Essential (primary) hypertension: Secondary | ICD-10-CM | POA: Insufficient documentation

## 2011-02-08 DIAGNOSIS — E119 Type 2 diabetes mellitus without complications: Secondary | ICD-10-CM | POA: Insufficient documentation

## 2011-02-08 DIAGNOSIS — Z79899 Other long term (current) drug therapy: Secondary | ICD-10-CM | POA: Insufficient documentation

## 2011-02-08 DIAGNOSIS — J45901 Unspecified asthma with (acute) exacerbation: Secondary | ICD-10-CM | POA: Insufficient documentation

## 2011-02-08 DIAGNOSIS — I2510 Atherosclerotic heart disease of native coronary artery without angina pectoris: Secondary | ICD-10-CM | POA: Insufficient documentation

## 2011-02-08 DIAGNOSIS — R0602 Shortness of breath: Secondary | ICD-10-CM | POA: Insufficient documentation

## 2011-02-08 DIAGNOSIS — R05 Cough: Secondary | ICD-10-CM | POA: Insufficient documentation

## 2011-02-08 LAB — POCT I-STAT TROPONIN I: Troponin i, poc: 0.07 ng/mL (ref 0.00–0.08)

## 2011-02-08 LAB — D-DIMER, QUANTITATIVE: D-Dimer, Quant: 0.46 ug/mL-FEU (ref 0.00–0.48)

## 2011-02-08 MED ORDER — PREDNISONE 20 MG PO TABS: 60.0000 mg | ORAL_TABLET | Freq: Every day | ORAL | Status: DC

## 2011-02-08 MED ORDER — IPRATROPIUM BROMIDE 0.02 % IN SOLN
0.5000 mg | Freq: Once | RESPIRATORY_TRACT | Status: AC
  Administered 2011-02-08: 0.5 mg via RESPIRATORY_TRACT

## 2011-02-08 MED ORDER — AZITHROMYCIN 250 MG PO TABS: 250.0000 mg | ORAL_TABLET | Freq: Every day | ORAL | Status: AC

## 2011-02-08 MED ORDER — ALBUTEROL SULFATE (5 MG/ML) 0.5% IN NEBU
5.0000 mg | INHALATION_SOLUTION | Freq: Once | RESPIRATORY_TRACT | Status: AC
  Administered 2011-02-08: 5 mg via RESPIRATORY_TRACT

## 2011-02-08 MED ORDER — DEXTROSE 5 % IV SOLN
1.0000 g | Freq: Once | INTRAVENOUS | Status: AC
  Administered 2011-02-08: 1 g via INTRAVENOUS
  Filled 2011-02-08: qty 10

## 2011-02-08 MED ORDER — PREDNISONE 20 MG PO TABS
60.0000 mg | ORAL_TABLET | Freq: Once | ORAL | Status: AC
  Administered 2011-02-08: 60 mg via ORAL
  Filled 2011-02-08: qty 3

## 2011-02-08 MED ORDER — ALBUTEROL SULFATE (5 MG/ML) 0.5% IN NEBU
INHALATION_SOLUTION | RESPIRATORY_TRACT | Status: AC
  Administered 2011-02-08: 5 mg via RESPIRATORY_TRACT
  Filled 2011-02-08: qty 1

## 2011-02-08 MED ORDER — IPRATROPIUM BROMIDE 0.02 % IN SOLN
RESPIRATORY_TRACT | Status: AC
  Administered 2011-02-08: 0.5 mg via RESPIRATORY_TRACT
  Filled 2011-02-08: qty 2.5

## 2011-02-08 NOTE — ED Notes (Signed)
Pt c/o shortness of breath since 0330 this morning w/o relief from rescue inhaler.

## 2011-02-08 NOTE — ED Notes (Signed)
Pt presents with worsening of his asthma that started around 0300 this morning and has not been made better by his at home albuterol treatment.

## 2011-02-08 NOTE — ED Provider Notes (Signed)
History     CSN: [id number]  Arrival date & time 02/08/11  0544   First MD Initiated Contact with Patient 02/08/11 [phone]      Chief Complaint  Patient presents with  . Asthma  . Shortness of Breath    (Consider location/radiation/quality/duration/timing/severity/associated sxs/prior treatment) HPI Comments: Patient reports that he has been short of breath and some wheezing for the past week.  He has been using his Albuterol inhaler every 4 hours with minimal relief.  He was seen approximately one month ago in the ED for the same complaint.  PMH significant for asthma.  He has never been hospitalized or intubated for asthma in the past.    Patient is a 56 y.o. male presenting with asthma and shortness of breath. The history is provided by the patient.  Asthma This is a recurrent problem. Episode onset: one week. The problem has been gradually worsening. Associated symptoms include coughing. Pertinent negatives include no abdominal pain, chest pain, chills, diaphoresis, fever, headaches, nausea, vomiting or weakness. Treatments tried: albuterol inhaler. The treatment provided mild relief.  Shortness of Breath  Associated symptoms include cough, shortness of breath and wheezing. Pertinent negatives include no chest pain and no fever. His past medical history is significant for asthma.    Past Medical History  Diagnosis Date  . HTN (hypertension)   . Cardiomyopathy   . CAD (coronary artery disease)   . DM2 (diabetes mellitus, type 2)   . Asthma     Past Surgical History  Procedure Date  . Cardiac catheterization     Family History  Problem Relation Age of Onset  . Peripheral vascular disease    . Hypertension    . Prostate cancer      History  Substance Use Topics  . Smoking status: Never Smoker   . Smokeless tobacco: Not on file  . Alcohol Use: Yes     rare      Review of Systems  Constitutional: Negative for fever, chills and diaphoresis.  Respiratory: Positive  for cough, shortness of breath and wheezing.   Cardiovascular: Negative for chest pain.  Gastrointestinal: Negative for nausea, vomiting and abdominal pain.  Genitourinary: Negative for decreased urine volume.  Neurological: Negative for dizziness, syncope, weakness and headaches.    Allergies  Review of patient's allergies indicates no known allergies.  Home Medications   Current Outpatient Rx  Name Route Sig Dispense Refill  . ALBUTEROL SULFATE HFA 108 (90 BASE) MCG/ACT IN AERS Inhalation Inhale 2 puffs into the lungs every 6 (six) hours as needed. 18 g 1  . AMLODIPINE BESYLATE 5 MG PO TABS Oral Take 1 tablet (5 mg total) by mouth daily. 30 tablet 11  . ASPIRIN 81 MG PO TABS Oral Take 81 mg by mouth daily.      [name] BENAZEPRIL HCL 40 MG PO TABS Oral Take 1 tablet (40 mg total) by mouth daily. 30 tablet 11  . GLIMEPIRIDE 2 MG PO TABS  TAKE ONE TABLET BY MOUTH DAILY BEFORE BREAKFAST 30 tablet 1    Overdue for yearly physical must see md before add ...  . HYDROCHLOROTHIAZIDE 12.5 MG PO CAPS Oral Take 12.5 mg by mouth daily.      [name] LIPITOR 20 MG PO TABS  TAKE ONE TABLET BY MOUTH EVERY DAY 30 each 6  . METFORMIN HCL 500 MG PO TABS Oral Take 500 mg by mouth 2 (two) times daily with a meal.      . SILDENAFIL CITRATE 50 MG  PO TABS Oral Take 50 mg by mouth daily as needed.      [name] SPIRONOLACTONE 25 MG PO TABS  TAKE TWO TABLETS BY MOUTH EVERY DAY 60 tablet 6    BP 155/100  Pulse 93  Temp(Src) 98.4 F (36.9 C) (Oral)  Resp 27  Ht 5\' 5"  (1.651 m)  Wt 185 lb (83.915 kg)  BMI 30.79 kg/m2  SpO2 98%  Physical Exam  Nursing note and vitals reviewed. Constitutional: He is oriented to person, place, and time. He appears well-developed and well-nourished. No distress.  HENT:  Head: Normocephalic and atraumatic.  Right Ear: External ear normal.  Left Ear: External ear normal.  Nose: Nose normal.  Mouth/Throat: Oropharynx is clear and moist. No oropharyngeal exudate.  Eyes: EOM are normal.    Neck: Normal range of motion. Neck supple.  Cardiovascular: Normal rate, regular rhythm and normal heart sounds.   Pulmonary/Chest: Effort normal and breath sounds normal. No accessory muscle usage or stridor. Not tachypneic. No respiratory distress. He has no wheezes. He has no rhonchi. He has no rales. He exhibits no tenderness.  Musculoskeletal: Normal range of motion.  Lymphadenopathy:    He has no cervical adenopathy.  Neurological: He is alert and oriented to person, place, and time.  Skin: Skin is warm and dry. He is not diaphoretic.  Psychiatric: He has a normal mood and affect.    ED Course  Procedures (including critical care time)   Labs Reviewed  D-DIMER, QUANTITATIVE  POCT I-STAT TROPONIN I  I-STAT TROPONIN I   Dg Chest 2 View  02/08/2011  *RADIOLOGY REPORT*  Clinical Data: Short of breath with cough.  CHEST - 2 VIEW  Comparison: 01/08/2011  Findings: The cardiac silhouette is mildly enlarged.  No mediastinal or hilar masses or adenopathy are noted.  There is coarse reticular and patchy airspace opacity at the right lung base partly silhouetting the right hemidiaphragm.  This may reflect atelectasis/scarring or infiltrate or a combination.  There are thickened bronchovascular markings bilaterally, relatively stable. The lungs are otherwise clear.  The bony thorax is intact.  IMPRESSION: Right basilar opacity could reflect infiltrate although atelectasis/scarring could have this appearance.  This was present on the prior study but appears increased, which supports an interval infiltrate.  Original Report Authenticated By: Domenic Moras, M.D.     No diagnosis found.   Date: 02/08/2011  Rate: 91  Rhythm: normal sinus rhythm  QRS Axis: left  Intervals: normal  ST/T Wave abnormalities: nonspecific T wave changes  Conduction Disutrbances:none  Narrative Interpretation:  T wave inversion in leads V4-V6 unchanged from past   Old EKG Reviewed: unchanged  8:15  AM Reassessed patient.  He reports that his shortness of breath has improved.  His oxygen saturation is currently 100% on RA.  Lungs CTAB.  Discussed the results of the labs and xray with patient.  Will start patient on antibiotic and give him one more breathing treatment.  9:40 AM Reassessed patient.  He reports that he is no longer feeling short of breath.  MDM  Patient with SOB and cough for the past week.  Troponin negative and EKG unchanged from previous EKG.  Negative d-dimer.  Chest xray shows possible pneumonia.  Will treat patient with antibiotic therapy for pneumonia. Patient hypertensive in the ED.  Patient reports that his PCP had prescribed him antihypertensives in the past, but he never got the medication filled.  Patient educated on the risks of having high blood pressure including MI and  stroke.  Patient has an appointment with his PCP in a few days.          Pascal Lux [facility] 02/08/11 1553

## 2011-02-09 NOTE — ED Provider Notes (Signed)
Medical screening examination/treatment/procedure(s) were conducted as a shared visit with non-physician practitioner(s) and myself.  I personally evaluated the patient during the encounter  Pt with wheezing, sob hx asthma. Feels same. No cp. Wheezing bil. Alb neb, pred.   Suzi Roots, MD 02/09/11 [phone]

## 2011-02-10 ENCOUNTER — Encounter: Payer: Self-pay | Admitting: Internal Medicine

## 2011-02-12 ENCOUNTER — Emergency Department (HOSPITAL_COMMUNITY): Payer: BC Managed Care – PPO

## 2011-02-12 ENCOUNTER — Ambulatory Visit: Payer: BC Managed Care – PPO | Admitting: Internal Medicine

## 2011-02-12 ENCOUNTER — Encounter (HOSPITAL_COMMUNITY): Payer: Self-pay

## 2011-02-12 ENCOUNTER — Other Ambulatory Visit: Payer: Self-pay

## 2011-02-12 ENCOUNTER — Inpatient Hospital Stay (HOSPITAL_COMMUNITY)
Admission: EM | Admit: 2011-02-12 | Discharge: 2011-02-15 | DRG: 127 | Disposition: A | Discharge status: Home / Self Care | Payer: BC Managed Care – PPO | Attending: Internal Medicine | Admitting: Internal Medicine

## 2011-02-12 DIAGNOSIS — I059 Rheumatic mitral valve disease, unspecified: Secondary | ICD-10-CM

## 2011-02-12 DIAGNOSIS — D72829 Elevated white blood cell count, unspecified: Secondary | ICD-10-CM | POA: Diagnosis present

## 2011-02-12 DIAGNOSIS — E7800 Pure hypercholesterolemia, unspecified: Secondary | ICD-10-CM | POA: Diagnosis present

## 2011-02-12 DIAGNOSIS — N183 Chronic kidney disease, stage 3 unspecified: Secondary | ICD-10-CM | POA: Diagnosis present

## 2011-02-12 DIAGNOSIS — J45909 Unspecified asthma, uncomplicated: Secondary | ICD-10-CM | POA: Diagnosis present

## 2011-02-12 DIAGNOSIS — I428 Other cardiomyopathies: Secondary | ICD-10-CM | POA: Diagnosis present

## 2011-02-12 DIAGNOSIS — I2510 Atherosclerotic heart disease of native coronary artery without angina pectoris: Secondary | ICD-10-CM | POA: Diagnosis present

## 2011-02-12 DIAGNOSIS — R0602 Shortness of breath: Secondary | ICD-10-CM | POA: Diagnosis present

## 2011-02-12 DIAGNOSIS — Z9114 Patient's other noncompliance with medication regimen: Secondary | ICD-10-CM

## 2011-02-12 DIAGNOSIS — R739 Hyperglycemia, unspecified: Secondary | ICD-10-CM

## 2011-02-12 DIAGNOSIS — I129 Hypertensive chronic kidney disease with stage 1 through stage 4 chronic kidney disease, or unspecified chronic kidney disease: Secondary | ICD-10-CM | POA: Diagnosis present

## 2011-02-12 DIAGNOSIS — Z9119 Patient's noncompliance with other medical treatment and regimen: Secondary | ICD-10-CM

## 2011-02-12 DIAGNOSIS — I4949 Other premature depolarization: Secondary | ICD-10-CM | POA: Diagnosis present

## 2011-02-12 DIAGNOSIS — E119 Type 2 diabetes mellitus without complications: Secondary | ICD-10-CM | POA: Diagnosis present

## 2011-02-12 DIAGNOSIS — Z0289 Encounter for other administrative examinations: Secondary | ICD-10-CM

## 2011-02-12 DIAGNOSIS — Z91199 Patient's noncompliance with other medical treatment and regimen due to unspecified reason: Secondary | ICD-10-CM

## 2011-02-12 DIAGNOSIS — E1165 Type 2 diabetes mellitus with hyperglycemia: Secondary | ICD-10-CM | POA: Diagnosis present

## 2011-02-12 DIAGNOSIS — I509 Heart failure, unspecified: Secondary | ICD-10-CM

## 2011-02-12 DIAGNOSIS — I5023 Acute on chronic systolic (congestive) heart failure: Principal | ICD-10-CM | POA: Diagnosis present

## 2011-02-12 DIAGNOSIS — Z791 Long term (current) use of non-steroidal anti-inflammatories (NSAID): Secondary | ICD-10-CM

## 2011-02-12 DIAGNOSIS — IMO0002 Reserved for concepts with insufficient information to code with codable children: Secondary | ICD-10-CM

## 2011-02-12 DIAGNOSIS — I10 Essential (primary) hypertension: Secondary | ICD-10-CM | POA: Diagnosis present

## 2011-02-12 LAB — URINALYSIS, ROUTINE W REFLEX MICROSCOPIC
Bilirubin Urine: NEGATIVE
Glucose, UA: 250 mg/dL — AB
Hgb urine dipstick: NEGATIVE
Ketones, ur: NEGATIVE mg/dL
Leukocytes, UA: NEGATIVE
Nitrite: NEGATIVE
Protein, ur: 30 mg/dL — AB
Specific Gravity, Urine: 1.029 (ref 1.005–1.030)
Urobilinogen, UA: 0.2 mg/dL (ref 0.0–1.0)
pH: 6 (ref 5.0–8.0)

## 2011-02-12 LAB — TROPONIN I: Troponin I: <0.3 ng/mL (ref <0.30)

## 2011-02-12 LAB — CREATININE, SERUM
Creatinine, Ser: 1.14 mg/dL (ref 0.50–1.35)
GFR calc Af Amer: 81 mL/min — ABNORMAL LOW (ref >90)
GFR calc non Af Amer: 70 mL/min — ABNORMAL LOW (ref >90)

## 2011-02-12 LAB — CBC
HCT: 38.6 % — ABNORMAL LOW (ref 39.0–52.0)
HCT: 38.6 % — ABNORMAL LOW (ref 39.0–52.0)
Hemoglobin: 13.8 g/dL (ref 13.0–17.0)
Hemoglobin: 13.3 g/dL (ref 13.0–17.0)
MCH: 30.5 pg (ref 26.0–34.0)
MCH: 29.4 pg (ref 26.0–34.0)
MCHC: 35.8 g/dL (ref 30.0–36.0)
MCHC: 34.5 g/dL (ref 30.0–36.0)
MCV: 85.2 fL (ref 78.0–100.0)
MCV: 85.4 fL (ref 78.0–100.0)
Platelets: 350 10*3/uL (ref 150–400)
Platelets: 371 10*3/uL (ref 150–400)
RBC: 4.53 MIL/uL (ref 4.22–5.81)
RBC: 4.52 MIL/uL (ref 4.22–5.81)
RDW: 12.8 % (ref 11.5–15.5)
RDW: 12.8 % (ref 11.5–15.5)
WBC: 22.6 10*3/uL — ABNORMAL HIGH (ref 4.0–10.5)
WBC: 21.7 10*3/uL — ABNORMAL HIGH (ref 4.0–10.5)

## 2011-02-12 LAB — GLUCOSE, CAPILLARY
Glucose-Capillary: 238 mg/dL — ABNORMAL HIGH (ref 70–99)
Glucose-Capillary: 195 mg/dL — ABNORMAL HIGH (ref 70–99)
Glucose-Capillary: 164 mg/dL — ABNORMAL HIGH (ref 70–99)

## 2011-02-12 LAB — POCT I-STAT, CHEM 8
BUN: 20 mg/dL (ref 6–23)
Calcium, Ion: 1.17 mmol/L (ref 1.12–1.32)
Chloride: 106 mEq/L (ref 96–112)
Creatinine, Ser: 1.1 mg/dL (ref 0.50–1.35)
Glucose, Bld: 260 mg/dL — ABNORMAL HIGH (ref 70–99)
HCT: 42 % (ref 39.0–52.0)
Hemoglobin: 14.3 g/dL (ref 13.0–17.0)
Potassium: 3.8 mEq/L (ref 3.5–5.1)
Sodium: 139 mEq/L (ref 135–145)
TCO2: 24 mmol/L (ref 0–100)

## 2011-02-12 LAB — DIFFERENTIAL
Basophils Absolute: 0 10*3/uL (ref 0.0–0.1)
Basophils Relative: 0 % (ref 0–1)
Eosinophils Absolute: 0 10*3/uL (ref 0.0–0.7)
Eosinophils Relative: 0 % (ref 0–5)
Lymphocytes Relative: 12 % (ref 12–46)
Lymphs Abs: 2.7 10*3/uL (ref 0.7–4.0)
Monocytes Absolute: 1.2 10*3/uL — ABNORMAL HIGH (ref 0.1–1.0)
Monocytes Relative: 5 % (ref 3–12)
Neutro Abs: 18.7 10*3/uL — ABNORMAL HIGH (ref 1.7–7.7)
Neutrophils Relative %: 83 % — ABNORMAL HIGH (ref 43–77)

## 2011-02-12 LAB — CARDIAC PANEL(CRET KIN+CKTOT+MB+TROPI)
CK, MB: 4.1 ng/mL — ABNORMAL HIGH (ref 0.3–4.0)
CK, MB: 3.6 ng/mL (ref 0.3–4.0)
Relative Index: INVALID (ref 0.0–2.5)
Relative Index: INVALID (ref 0.0–2.5)
Total CK: 57 U/L (ref 7–232)
Total CK: 53 U/L (ref 7–232)
Troponin I: <0.3 ng/mL (ref <0.30)
Troponin I: <0.3 ng/mL (ref <0.30)

## 2011-02-12 LAB — HEMOGLOBIN A1C
Hgb A1c MFr Bld: 7.6 % — ABNORMAL HIGH (ref <5.7)
Mean Plasma Glucose: 171 mg/dL — ABNORMAL HIGH (ref <117)

## 2011-02-12 LAB — D-DIMER, QUANTITATIVE: D-Dimer, Quant: 0.52 ug/mL-FEU — ABNORMAL HIGH (ref 0.00–0.48)

## 2011-02-12 LAB — PRO B NATRIURETIC PEPTIDE: Pro B Natriuretic peptide (BNP): 2771 pg/mL — ABNORMAL HIGH (ref 0–125)

## 2011-02-12 LAB — URINE MICROSCOPIC-ADD ON

## 2011-02-12 LAB — MRSA PCR SCREENING: MRSA by PCR: NEGATIVE

## 2011-02-12 MED ORDER — IOHEXOL 300 MG/ML  SOLN
100.0000 mL | Freq: Once | INTRAMUSCULAR | Status: AC | PRN
  Administered 2011-02-12: 100 mL via INTRAVENOUS

## 2011-02-12 MED ORDER — ENOXAPARIN SODIUM 40 MG/0.4ML ~~LOC~~ SOLN
40.0000 mg | Freq: Every day | SUBCUTANEOUS | Status: DC
  Administered 2011-02-12 – 2011-02-15 (×4): 40 mg via SUBCUTANEOUS
  Filled 2011-02-12 (×4): qty 0.4

## 2011-02-12 MED ORDER — METOPROLOL TARTRATE 25 MG PO TABS
25.0000 mg | ORAL_TABLET | Freq: Two times a day (BID) | ORAL | Status: DC
  Administered 2011-02-12 – 2011-02-13 (×4): 25 mg via ORAL
  Filled 2011-02-12 (×6): qty 1

## 2011-02-12 MED ORDER — FUROSEMIDE 20 MG PO TABS
20.0000 mg | ORAL_TABLET | Freq: Once | ORAL | Status: AC
  Administered 2011-02-12: 20 mg via ORAL
  Filled 2011-02-12: qty 1

## 2011-02-12 MED ORDER — SIMVASTATIN 40 MG PO TABS
40.0000 mg | ORAL_TABLET | Freq: Every day | ORAL | Status: DC
  Administered 2011-02-12 – 2011-02-14 (×3): 40 mg via ORAL
  Filled 2011-02-12 (×5): qty 1

## 2011-02-12 MED ORDER — ACETAMINOPHEN 325 MG PO TABS
650.0000 mg | ORAL_TABLET | Freq: Four times a day (QID) | ORAL | Status: DC | PRN
  Administered 2011-02-12 – 2011-02-13 (×2): 650 mg via ORAL
  Filled 2011-02-12 (×2): qty 2

## 2011-02-12 MED ORDER — LEVALBUTEROL HCL 0.63 MG/3ML IN NEBU
0.6300 mg | INHALATION_SOLUTION | RESPIRATORY_TRACT | Status: DC | PRN
  Administered 2011-02-12: 0.63 mg via RESPIRATORY_TRACT
  Filled 2011-02-12 (×2): qty 3

## 2011-02-12 MED ORDER — GLIMEPIRIDE 2 MG PO TABS
2.0000 mg | ORAL_TABLET | Freq: Every day | ORAL | Status: DC
  Administered 2011-02-13 – 2011-02-15 (×3): 2 mg via ORAL
  Filled 2011-02-12 (×4): qty 1

## 2011-02-12 MED ORDER — NITROGLYCERIN 0.4 MG SL SUBL: 0.4000 mg | SUBLINGUAL_TABLET | SUBLINGUAL | Status: DC | PRN

## 2011-02-12 MED ORDER — FUROSEMIDE 10 MG/ML IJ SOLN
20.0000 mg | Freq: Two times a day (BID) | INTRAMUSCULAR | Status: DC
  Administered 2011-02-12 – 2011-02-15 (×6): 20 mg via INTRAVENOUS
  Filled 2011-02-12 (×10): qty 2

## 2011-02-12 MED ORDER — ONDANSETRON HCL 4 MG/2ML IJ SOLN: 4.0000 mg | Freq: Four times a day (QID) | INTRAMUSCULAR | Status: DC | PRN

## 2011-02-12 MED ORDER — INSULIN ASPART 100 UNIT/ML ~~LOC~~ SOLN
0.0000 [IU] | Freq: Three times a day (TID) | SUBCUTANEOUS | Status: DC
Start: 2011-02-12 — End: 2011-02-15
  Administered 2011-02-12: 3 [IU] via SUBCUTANEOUS
  Administered 2011-02-12: 5 [IU] via SUBCUTANEOUS
  Administered 2011-02-13: 3 [IU] via SUBCUTANEOUS
  Administered 2011-02-13: 2 [IU] via SUBCUTANEOUS
  Administered 2011-02-13 – 2011-02-14 (×2): 3 [IU] via SUBCUTANEOUS
  Administered 2011-02-14: 2 [IU] via SUBCUTANEOUS
  Administered 2011-02-15: 3 [IU] via SUBCUTANEOUS
  Filled 2011-02-12 (×2): qty 3

## 2011-02-12 MED ORDER — NITROGLYCERIN 2 % TD OINT
1.0000 [in_us] | TOPICAL_OINTMENT | Freq: Once | TRANSDERMAL | Status: AC
  Administered 2011-02-12: 1 [in_us] via TOPICAL
  Filled 2011-02-12: qty 30

## 2011-02-12 MED ORDER — SPIRONOLACTONE 50 MG PO TABS
50.0000 mg | ORAL_TABLET | Freq: Every day | ORAL | Status: DC
  Administered 2011-02-12 – 2011-02-15 (×4): 50 mg via ORAL
  Filled 2011-02-12 (×5): qty 1

## 2011-02-12 MED ORDER — ASPIRIN EC 81 MG PO TBEC
81.0000 mg | DELAYED_RELEASE_TABLET | Freq: Every day | ORAL | Status: DC
  Administered 2011-02-12 – 2011-02-15 (×4): 81 mg via ORAL
  Filled 2011-02-12 (×4): qty 1

## 2011-02-12 MED ORDER — ACETAMINOPHEN 650 MG RE SUPP: 650.0000 mg | Freq: Four times a day (QID) | RECTAL | Status: DC | PRN

## 2011-02-12 MED ORDER — INSULIN ASPART 100 UNIT/ML ~~LOC~~ SOLN
0.0000 [IU] | Freq: Every day | SUBCUTANEOUS | Status: DC
  Administered 2011-02-13: 2 [IU] via SUBCUTANEOUS

## 2011-02-12 MED ORDER — NITROGLYCERIN IN D5W 200-5 MCG/ML-% IV SOLN
2.0000 ug/min | INTRAVENOUS | Status: DC
  Administered 2011-02-12: 5 ug/min via INTRAVENOUS
  Filled 2011-02-12: qty 250

## 2011-02-12 MED ORDER — ONDANSETRON HCL 4 MG PO TABS: 4.0000 mg | ORAL_TABLET | Freq: Four times a day (QID) | ORAL | Status: DC | PRN

## 2011-02-12 NOTE — ED Provider Notes (Signed)
History     CSN: [id number]  Arrival date & time 02/12/11  4098   First MD Initiated Contact with Patient 02/12/11 [phone]      Chief Complaint  Patient presents with  . Shortness of Breath  . Wheezing    (Consider location/radiation/quality/duration/timing/severity/associated sxs/prior treatment) HPI  56 year old male with no significant history of asthma or COPD presenting to the ED with chief complaints of shortness of breath. Patient states for the past month he has been having intermittent bouts of wheezing and shortness of breath. Shortness of breath worsening since last night. Patient felt as if he can't catch his breath. He can not recall anything that worsened the symptoms or provide improvement. Short of breath can happen while sitting in bed. Patient denies headache, lightheadedness, chest pain, abdominal pain, calf pain, leg swelling. Patient denies significant cough, sneezing, runny nose, or sorethroat. Patient denies any recent medication change. He has been seen in the ED 3 times in the past past month for same. Most recent chest x-ray is suspicious for pneumonia. He was given Zithromax and prednisone, has not noticed any improvement. He is a nonsmoker. Patient denies recent surgery, prolonged bed rest, or recent travel.  Past Medical History  Diagnosis Date  . HTN (hypertension)   . Cardiomyopathy   . CAD (coronary artery disease)   . DM2 (diabetes mellitus, type 2)   . Asthma   . CAD 04/20/2009  . CARDIOMYOPATHY 04/25/2009  . DIABETES MELLITUS, TYPE II 04/25/2009  . HYPERCHOLESTEROLEMIA 09/17/2009  . HYPERTENSION 04/20/2009  . PREMATURE VENTRICULAR CONTRACTIONS 09/17/2009  . PSA, INCREASED 04/26/2009    Past Surgical History  Procedure Date  . Cardiac catheterization     Family History  Problem Relation Age of Onset  . Peripheral vascular disease    . Hypertension    . Prostate cancer    . Hypertension Mother   . Peripheral vascular disease Mother   . Prostate  cancer Brother     History  Substance Use Topics  . Smoking status: Never Smoker   . Smokeless tobacco: Not on file   Comment: Widowed, then married-but seperated now. 9 yo son on HD-to move in with pt  . Alcohol Use: Yes     rare      Review of Systems  All other systems reviewed and are negative.    Allergies  Review of patient's allergies indicates no known allergies.  Home Medications   Current Outpatient Rx  Name Route Sig Dispense Refill  . ALBUTEROL SULFATE HFA 108 (90 BASE) MCG/ACT IN AERS Inhalation Inhale 2 puffs into the lungs every 6 (six) hours as needed. For shortness of breath.    . AMLODIPINE BESYLATE 5 MG PO TABS Oral Take 5 mg by mouth daily.    . ASPIRIN 81 MG PO TABS Oral Take 81 mg by mouth daily.      . ASPIRIN EC 81 MG PO TBEC Oral Take 81 mg by mouth daily.    . ASPIRIN-ACETAMINOPHEN 500-325 MG PO PACK Oral Take 1 packet by mouth every 6 (six) hours as needed. For headache.    . ATORVASTATIN CALCIUM 20 MG PO TABS Oral Take 20 mg by mouth daily.    . ATORVASTATIN CALCIUM 20 MG PO TABS      . AZITHROMYCIN 250 MG PO TABS Oral Take 1 tablet (250 mg total) by mouth daily. Take first 2 tablets together, then 1 every day until finished. 6 tablet 0  . BENAZEPRIL HCL 40 MG PO  TABS Oral Take 40 mg by mouth daily.    [name] GLIMEPIRIDE 2 MG PO TABS Oral Take 2 mg by mouth daily before breakfast.    . GLIMEPIRIDE 2 MG PO TABS      . HYDROCHLOROTHIAZIDE 12.5 MG PO CAPS Oral Take 12.5 mg by mouth daily.     [name] METFORMIN HCL 500 MG PO TABS Oral Take 500 mg by mouth 2 (two) times daily with a meal.     . PREDNISONE 20 MG PO TABS Oral Take 3 tablets (60 mg total) by mouth daily. 15 tablet 0  . SILDENAFIL CITRATE 50 MG PO TABS Oral Take 50 mg by mouth daily as needed. For erectile dysfunction.    . SPIRONOLACTONE 25 MG PO TABS Oral Take 50 mg by mouth daily.    [name] SPIRONOLACTONE 25 MG PO TABS        BP 178/122  Pulse 103  Temp(Src) 97.9 F (36.6 C) (Oral)  Resp 24   SpO2 100%  Physical Exam  Nursing note and vitals reviewed. Constitutional: He appears well-developed and well-nourished. No distress.       Awake, alert, nontoxic appearance  HENT:  Head: Normocephalic and atraumatic.  Mouth/Throat: Oropharynx is clear and moist.  Eyes: Conjunctivae are normal. Right eye exhibits no discharge. Left eye exhibits no discharge.  Neck: Neck supple.  Cardiovascular: Tachycardia present.   Pulmonary/Chest: Effort normal and breath sounds normal. No accessory muscle usage. No respiratory distress. He exhibits no tenderness.  Abdominal: There is no tenderness. There is no rebound.  Musculoskeletal: He exhibits no tenderness.       Baseline ROM, no obvious new focal weakness.  Calves nontender and nonedematous bilaterally.  Pedal pulse 2+ bilat.  Negative Homan's sign  Lymphadenopathy:    He has no cervical adenopathy.  Neurological:       Mental status and motor strength appears baseline for patient and situation  Skin: No rash noted.  Psychiatric: He has a normal mood and affect.    ED Course  Procedures (including critical care time)  Labs Reviewed - No data to display No results found.   No diagnosis found.   Date: 02/12/2011  Rate: 96  Rhythm: normal sinus rhythm  QRS Axis: left  Intervals: normal  ST/T Wave abnormalities: nonspecific ST/T changes  Conduction Disutrbances:left bundle branch block  Narrative Interpretation: IVCD, atypical LBBB, LVH  Old EKG Reviewed: unchanged    MDM  Recurrent shortness of breath worse with no obvious underlying source. Patient appears concerned, however in no acute respiratory distress. Able to speak in complete sentences. Oxygen satting at 100% on room air. His lungs clear to auscultation bilaterally. He has no lower extremity edema. Patient however has a significant significant history of hypertension and diabetes, therefore I will obtain an EKG, troponin, d-dimer, basic labs, and repeat chest x-ray. I  discussed with attending who agrees with plan.  Pt has a recent stress test showing non ischemic cardiomyopathy.  Negative cath several years ago.     7:56 AM Mildly elevated d-dimer 0.52, Chest CTA ordered.  White blood cells 22.6 with a left shift.  However, pt currently on Steroid which may account for WBC elevation.  Afebrile at this time.  Pt likely benefit from admission for further management of worsening cardiomyopathy, evidence of CHF, and uncontrolled hypertension.    Nitro ointment, along with SL nitro, and Lasix given.  I have consulted with [facility], who agrees to admit pt to Team 5, Tele bed, under  Dr. Ginger Organ.  Pt aware and agrees with plan.   Fayrene Helper, PA-C 02/12/11 [phone]  Medical screening examination/treatment/procedure(s) were performed by non-physician practitioner and as supervising physician I was immediately available for consultation/collaboration.   Sunnie Nielsen, MD 02/13/11 [phone]

## 2011-02-12 NOTE — ED Notes (Signed)
Pt returned from xray to ED10.

## 2011-02-12 NOTE — ED Notes (Signed)
Report attempted, unsure at this time which nurse is to receive pt, to call back

## 2011-02-12 NOTE — ED Notes (Signed)
Patient transported to CT 

## 2011-02-12 NOTE — ED Notes (Signed)
Pt reports that he has not taken his blood pressure medicine in over one month.

## 2011-02-12 NOTE — ED Notes (Signed)
Pt was seen and treated Saturday for the same, received zpack and prednisone, no relief

## 2011-02-12 NOTE — Progress Notes (Signed)
  Echocardiogram 2D Echocardiogram has been performed.  Cathie Beams Deneen 02/12/2011, 12:43 PM

## 2011-02-12 NOTE — H&P (Signed)
PCP:   Rene Paci, MD, MD   Chief Complaint:  Worsening shortness of breath  HPI: The patient is a 56 year old black male with past medical history significant for nonischemic cardiomyopathy last ejection fraction in 2009 -45%, status post cardiac catheterization in 2005 with less than 30% stenosis in 2 vessels, hypertension and diabetes mellitus who presents with above complaints. He states he has had worsening shortness of breath over the past one month. He admits to a mild cough occasionally productive of clear phlegm, PND,? Orthopnea. The shortness of breath is worse with exertion. He admits to noncompliance with all of his medications for the past one month except maybe metformin.He denies chest pain, also denies leg swelling, fevers, diarrhea, dysuria. This is  his the third  visit to the ED in about a month- during his most recent visit on 02/08/11- his chest x-ray showed a right basilar opacity-atelectasis versus scarring and she was empirically started on Zithromax , and steroids for flare asthma of with a possible pneumonia. Reports that he was compliant with his medications but continued to be symptomatic and so came back to the ED today. Chest x-ray done today showed trace bilateral effusions with a mild right base atelectasis, a d-dimer was done and was elevated and so a followup CT angiogram was done and it came back showing no evidence of pulmonary embolism, probable right base atx, and suggestion of elevated right heart pressures noted. A brain natruretic peptide was done and came back elevated at a 2771, his white cell count was noted to be elevated at 22, urinalysis was negative for infection. His point-of-care markers were negative an EKG with no acute ischemic changes- PVCs, LVH changes noted. He is admitted for further evaluation and management. The patient states that he last saw his cardiologist about a year ago. He also reports that his history of asthma is a remote-she's not had  any problems with asthma in about 37 years, and he is a nonsmoker.   Review of Systems:  The patient denies anorexia, fever, weight loss,, vision loss, decreased hearing, hoarseness, chest pain, syncope,  peripheral edema, balance deficits, hemoptysis, abdominal pain, melena, hematochezia, severe indigestion/heartburn, hematuria, incontinence,  muscle weakness, , transient blindness, difficulty walking, depression, unusual weight change, abnormal bleeding.  Past Medical History: Past Medical History  Diagnosis Date  . HTN (hypertension)   . Cardiomyopathy   . CAD (coronary artery disease)   . DM2 (diabetes mellitus, type 2)   . Asthma   . CAD 04/20/2009  . CARDIOMYOPATHY 04/25/2009  . DIABETES MELLITUS, TYPE II 04/25/2009  . HYPERCHOLESTEROLEMIA 09/17/2009  . HYPERTENSION 04/20/2009  . PREMATURE VENTRICULAR CONTRACTIONS 09/17/2009  . PSA, INCREASED 04/26/2009   Past Surgical History  Procedure Date  . Cardiac catheterization     Medications: Prior to Admission medications   Medication Sig Start Date End Date Taking? Authorizing Provider  albuterol (PROVENTIL HFA;VENTOLIN HFA) 108 (90 BASE) MCG/ACT inhaler Inhale 2 puffs into the lungs every 6 (six) hours as needed. For shortness of breath.   Yes Rene Paci, MD  amLODipine (NORVASC) 5 MG tablet Take 5 mg by mouth daily.   Yes Rollene Rotunda, MD  aspirin EC 81 MG tablet Take 81 mg by mouth daily.   Yes Historical Provider, MD  Aspirin-Acetaminophen (GOODY BODY PAIN) 500-325 MG PACK Take 1 packet by mouth every 6 (six) hours as needed. For headache.   Yes Historical Provider, MD  atorvastatin (LIPITOR) 20 MG tablet Take 20 mg by mouth  daily.   Yes Historical Provider, MD  azithromycin (ZITHROMAX) 250 MG tablet Take 1 tablet (250 mg total) by mouth daily. Take first 2 tablets together, then 1 every day until finished. 02/08/11 02/13/11 Yes Heather Zenaida Niece Wingen  benazepril (LOTENSIN) 40 MG tablet Take 40 mg by mouth daily.   Yes Rollene Rotunda, MD  glimepiride (AMARYL) 2 MG tablet Take 2 mg by mouth daily before breakfast.   Yes Historical Provider, MD  hydrochlorothiazide (,MICROZIDE/HYDRODIURIL,) 12.5 MG capsule Take 12.5 mg by mouth daily.    Yes Historical Provider, MD  metFORMIN (GLUCOPHAGE) 500 MG tablet Take 500 mg by mouth 2 (two) times daily with a meal.    Yes Historical Provider, MD  predniSONE (DELTASONE) 20 MG tablet Take 3 tablets (60 mg total) by mouth daily. 02/08/11 02/18/11 Yes Heather Zenaida Niece Wingen  sildenafil (VIAGRA) 50 MG tablet Take 50 mg by mouth daily as needed. For erectile dysfunction.   Yes Historical Provider, MD  spironolactone (ALDACTONE) 25 MG tablet Take 50 mg by mouth daily.   Yes Historical Provider, MD    Allergies:  No Known Allergies  Social History:  reports that he has never smoked. He does not have any smokeless tobacco history on file. He reports that he drinks alcohol. He reports that he does not use illicit drugs.  Family History: Family History  Problem Relation Age of Onset  . Peripheral vascular disease    . Hypertension    . Prostate cancer    . Hypertension Mother   . Peripheral vascular disease Mother   . Prostate cancer Brother     Physical Exam: Filed Vitals:   02/12/11 0803 02/12/11 0825 02/12/11 0914 02/12/11 1000  BP: 161/116 161/116 167/109 162/107  Pulse:    87  Temp:      TempSrc:      Resp: 24 20 23 24   SpO2: 100% 100% 100% 98%   Constitutional: Vital signs reviewed.  Patient is a well-developed and well-nourished , appears tachypneic, able to speak in full sentences. exam. Alert and oriented x3.  Head: Normocephalic and atraumatic Mouth: no erythema or exudates, MMM Eyes: PERRL, EOMI, conjunctivae normal, No scleral icterus.  Neck: Supple, Trachea midline normal ROM, mass, thyromegaly, or carotid bruit present.  Cardiovascular: RRR, S1 normal, S2 normal, no S3 appreciated,no MRG, pulses symmetric and intact bilaterally Pulmonary/Chest: Decreased  breath sounds at the bases, no wheezes, rales, or rhonchi. Abdominal: Soft. Non-tender, non-distended, bowel sounds are normal, no masses, organomegaly, or guarding present.  Extremities: no cyanosis or edema  Hematology: no cervical, inginal, or axillary adenopathy.  Neurological: A&O x3, Strenght is normal and symmetric bilaterally, cranial nerve II-XII are grossly intact, no focal motor deficit, sensory intact to light touch bilaterally.  Skin: Warm, dry and intact. No rash, cyanosis, or clubbing.        Labs on Admission:   [facility] 02/12/11 0743  NA 139  K 3.8  CL 106  CO2 --  GLUCOSE 260*  BUN 20  CREATININE 1.10  CALCIUM --  MG --  PHOS --   No results found for this basename: AST:2,ALT:2,ALKPHOS:2,BILITOT:2,PROT:2,ALBUMIN:2 in the last 72 hours No results found for this basename: LIPASE:2,AMYLASE:2 in the last 72 hours  Basename 02/12/11 0743 02/12/11 0725  WBC -- 22.6*  NEUTROABS -- 18.7*  HGB 14.3 13.8  HCT 42.0 38.6*  MCV -- 85.2  PLT -- 350    Basename 02/12/11 0725  CKTOTAL --  CKMB --  CKMBINDEX --  TROPONINI <0.30  No results found for this basename: TSH,T4TOTAL,FREET3,T3FREE,THYROIDAB in the last 72 hours No results found for this basename: VITAMINB12:2,FOLATE:2,FERRITIN:2,TIBC:2,IRON:2,RETICCTPCT:2 in the last 72 hours  Radiological Exams on Admission: Dg Chest 2 View  02/12/2011  *RADIOLOGY REPORT*  Clinical Data: Chest tightness.  Shortness of breath.  CHEST - 2 VIEW  Comparison: 01/08/2011  Findings: Patient minimally rotated the right.  Mild cardiomegaly. Trace bilateral pleural fluid. No pneumothorax.  Interstitial thickening is slightly increased.  There is thickening of the right minor fissure.  Minimal right base subsegmental atelectasis.  IMPRESSION:  1.  Cardiomegaly with mild interstitial thickening/venous congestion.  No overt congestive failure. 2.  Trace bilateral pleural effusions. 3.  Mild right base atelectasis.  Original Report  Authenticated By: Consuello Bossier, M.D.   Dg Chest 2 View  02/08/2011  *RADIOLOGY REPORT*  Clinical Data: Short of breath with cough.  CHEST - 2 VIEW  Comparison: 01/08/2011  Findings: The cardiac silhouette is mildly enlarged.  No mediastinal or hilar masses or adenopathy are noted.  There is coarse reticular and patchy airspace opacity at the right lung base partly silhouetting the right hemidiaphragm.  This may reflect atelectasis/scarring or infiltrate or a combination.  There are thickened bronchovascular markings bilaterally, relatively stable. The lungs are otherwise clear.  The bony thorax is intact.  IMPRESSION: Right basilar opacity could reflect infiltrate although atelectasis/scarring could have this appearance.  This was present on the prior study but appears increased, which supports an interval infiltrate.  Original Report Authenticated By: Domenic Moras, M.D.   Ct Angio Chest W/cm &/or Wo Cm  02/12/2011  *RADIOLOGY REPORT*  Clinical Data:  Evaluate for pulmonary embolism.  Nonsmoker with history of diabetes, asthma, cardiomyopathy.  CT ANGIOGRAPHY OF THE CHEST  Technique:  Multidetector CT angiography of the chest was performed after contrast with bolus timed to evaluate the pulmonary arteries. Multiplanar CT image reconstructions including MIPs were obtained to evaluate the vascular anatomy.  Contrast:  100  ml Omnipaque-300  Comparison: Plain films, including earlier today.  No prior CT.  Findings: Lung windows demonstrate dependent opacity in the right lower lobe on image 72, likely atelectasis.  Upper lobe predominant smooth septal thickening is relatively mild.  Soft tissue windows:  The quality of this exam for evaluation of pulmonary embolism is good.  The primary limitation is motion artifact, primarily involving the lower lobes.  Apparent filling defects within segmental to subsegmental pulmonary artery branches in the right lower lobe, including image 164 of series 7 are felt to be  due to motion artifact, which is moderate in this area. No evidence of pulmonary embolism to the large segmental level.  Normal caliber of the thoracic aorta.  Not opacified secondary to bolus timing.  Moderate cardiomegaly.  No pericardial effusion. Small right greater than left pleural effusions.  Mild reflux of contrast in the IVC suggests elevated right heart pressures.  Small mediastinal nodes, without adenopathy.  Limited abdominal imaging demonstrates small splenules. No acute osseous abnormality.   Review of the MIP images confirms the above findings.  IMPRESSION:  1.  No evidence of pulmonary embolism to the large segmental level. Motion degradation, primarily involving the lung bases. 2.  Findings of congestive heart failure. 3.  Probable atelectasis at the right lung base.  Recommend chest CT follow-up at 3 months to confirm stability or resolution.  Original Report Authenticated By: Consuello Bossier, M.D.    Assessment/Plan Present on Admission:  .SOB (shortness of breath)/acute on chronic of systolic CHF  -  As discussed above, likely secondary to noncompliance and (has not taken his meds in a month) and the resulting malignant hypertension as well, presenting with symptoms of left heart failure. Will admit  to step down unit for blood pressure control and close monitoring- nitro drip, po metoprolol. Cycle cardiac enzymes, obtain 2-D echocardiogram-to assess for any worsening of his LV function. Will diurese with of Lasix, resume spironolactone. Follow and are also resume his ACE inhibitors as appropriate as well close monitoring of his renal function while his been diuresed. Follow and consult cardiology pending above studies-patient is followed by Dr. Antoine Poche in/[place] [facility] .HTN (hypertension), malignant -As discussed above, blood pressure control with close monitoring the stent none unit the  .DIABETES MELLITUS, TYPE II -monitor Accu-Cheks, cover with sliding scale insulin. Continue  Amaryl. Hold off metformin for now. Marland KitchenCARDIOMYOPATHY, nonischemic  -As discussed above  .Leukocytosis -Likely secondary to steroids, follow recheck and further treat as appropriate. He is hemodynamically stable, and afebrile. His urinalysis is negative, and chest x-rays/CT angio not convincing for pneumonia    Dalis Beers C 02/12/2011, 10:34 AM

## 2011-02-12 NOTE — ED Notes (Signed)
Pt gone to xray

## 2011-02-13 ENCOUNTER — Other Ambulatory Visit: Payer: Self-pay

## 2011-02-13 DIAGNOSIS — R0602 Shortness of breath: Secondary | ICD-10-CM

## 2011-02-13 LAB — CBC
HCT: 39.4 % (ref 39.0–52.0)
Hemoglobin: 13.3 g/dL (ref 13.0–17.0)
MCH: 29.1 pg (ref 26.0–34.0)
MCHC: 33.8 g/dL (ref 30.0–36.0)
MCV: 86.2 fL (ref 78.0–100.0)
Platelets: 366 10*3/uL (ref 150–400)
RBC: 4.57 MIL/uL (ref 4.22–5.81)
RDW: 12.9 % (ref 11.5–15.5)
WBC: 18.2 10*3/uL — ABNORMAL HIGH (ref 4.0–10.5)

## 2011-02-13 LAB — COMPREHENSIVE METABOLIC PANEL
ALT: 59 U/L — ABNORMAL HIGH (ref 0–53)
AST: 23 U/L (ref 0–37)
Albumin: 3 g/dL — ABNORMAL LOW (ref 3.5–5.2)
Alkaline Phosphatase: 90 U/L (ref 39–117)
BUN: 29 mg/dL — ABNORMAL HIGH (ref 6–23)
CO2: 28 mEq/L (ref 19–32)
Calcium: 8.8 mg/dL (ref 8.4–10.5)
Chloride: 100 mEq/L (ref 96–112)
Creatinine, Ser: 1.36 mg/dL — ABNORMAL HIGH (ref 0.50–1.35)
GFR calc Af Amer: 66 mL/min — ABNORMAL LOW (ref >90)
GFR calc non Af Amer: 57 mL/min — ABNORMAL LOW (ref >90)
Glucose, Bld: 165 mg/dL — ABNORMAL HIGH (ref 70–99)
Potassium: 3.7 mEq/L (ref 3.5–5.1)
Sodium: 136 mEq/L (ref 135–145)
Total Bilirubin: 0.3 mg/dL (ref 0.3–1.2)
Total Protein: 6.4 g/dL (ref 6.0–8.3)

## 2011-02-13 LAB — BLOOD GAS, ARTERIAL
Acid-Base Excess: 2.6 mmol/L — ABNORMAL HIGH (ref 0.0–2.0)
Bicarbonate: 26 mEq/L — ABNORMAL HIGH (ref 20.0–24.0)
Drawn by: 308601
O2 Content: 4 L/min
O2 Saturation: 97 %
Patient temperature: 98.6
TCO2: 22.8 mmol/L (ref 0–100)
pCO2 arterial: 37.6 mmHg (ref 35.0–45.0)
pH, Arterial: 7.454 — ABNORMAL HIGH (ref 7.350–7.450)
pO2, Arterial: 73.8 mmHg — ABNORMAL LOW (ref 80.0–100.0)

## 2011-02-13 LAB — CARDIAC PANEL(CRET KIN+CKTOT+MB+TROPI)
CK, MB: 3.5 ng/mL (ref 0.3–4.0)
Relative Index: INVALID (ref 0.0–2.5)
Total CK: 48 U/L (ref 7–232)
Troponin I: <0.3 ng/mL (ref <0.30)

## 2011-02-13 LAB — GLUCOSE, CAPILLARY
Glucose-Capillary: 185 mg/dL — ABNORMAL HIGH (ref 70–99)
Glucose-Capillary: 167 mg/dL — ABNORMAL HIGH (ref 70–99)
Glucose-Capillary: 138 mg/dL — ABNORMAL HIGH (ref 70–99)

## 2011-02-13 LAB — TSH: TSH: 1.773 u[IU]/mL (ref 0.350–4.500)

## 2011-02-13 MED ORDER — PNEUMOCOCCAL VAC POLYVALENT 25 MCG/0.5ML IJ INJ
0.5000 mL | INJECTION | INTRAMUSCULAR | Status: AC
  Administered 2011-02-14: 0.5 mL via INTRAMUSCULAR
  Filled 2011-02-13: qty 0.5

## 2011-02-13 MED ORDER — BENAZEPRIL HCL 40 MG PO TABS
40.0000 mg | ORAL_TABLET | Freq: Every day | ORAL | Status: DC
  Administered 2011-02-13 – 2011-02-15 (×3): 40 mg via ORAL
  Filled 2011-02-13 (×3): qty 1

## 2011-02-13 MED ORDER — ZOLPIDEM TARTRATE 10 MG PO TABS
10.0000 mg | ORAL_TABLET | Freq: Once | ORAL | Status: AC
  Administered 2011-02-13: 10 mg via ORAL
  Filled 2011-02-13: qty 1

## 2011-02-13 MED ORDER — INFLUENZA VIRUS VACC SPLIT PF IM SUSP
0.5000 mL | INTRAMUSCULAR | Status: AC
  Administered 2011-02-14: 0.5 mL via INTRAMUSCULAR
  Filled 2011-02-13: qty 0.5

## 2011-02-13 NOTE — Progress Notes (Signed)
eLink Physician-Brief Progress Note Patient Name: [name] DOB: 06-21-1955 MRN: [id number]  Date of Service  02/13/2011   HPI/Events of Note   Insomnia/anxiety  eICU Interventions  One time for for ambien 10 mg now   Intervention Category Minor Interventions: Routine modifications to care plan (e.g. PRN medications for pain, fever)  [name] 02/13/2011, 12:37 AM

## 2011-02-13 NOTE — Progress Notes (Signed)
[name] is a 56 y.o. male patient admitted with worsening shortness of breath and hypertensive urgency. His 2 d echo showed global hypokinesis with EF of 20% as well as moderate to severe mitral regurgitation. He professes non compliance with medications. He feels better today. He had episode of bradycardia last night, thought to be related to ?[place]. Heart rate has since stabilized in the 80's.  SUBJECTIVE Denies shortness of breath or chest pain.   1. CHF (congestive heart failure)   2. SOB (shortness of breath)   3. Hyperglycemia     Past Medical History  Diagnosis Date  . HTN (hypertension)   . Cardiomyopathy   . DM2 (diabetes mellitus, type 2)   . Asthma   . CAD 04/20/2009  . CARDIOMYOPATHY 04/25/2009  . DIABETES MELLITUS, TYPE II 04/25/2009  . HYPERCHOLESTEROLEMIA 09/17/2009  . HYPERTENSION 04/20/2009  . PREMATURE VENTRICULAR CONTRACTIONS 09/17/2009  . PSA, INCREASED 04/26/2009   Current Facility-Administered Medications  Medication Dose Route Frequency Provider Last Rate Last Dose  . acetaminophen (TYLENOL) tablet 650 mg  650 mg Oral Q6H PRN Kela Millin, MD   650 mg at 02/12/11 1617   Or  . acetaminophen (TYLENOL) suppository 650 mg  650 mg Rectal Q6H PRN Adeline C Viyuoh, MD      . aspirin EC tablet 81 mg  81 mg Oral Daily Kela Millin, MD   81 mg at 02/13/11 0957  . benazepril (LOTENSIN) tablet 40 mg  40 mg Oral Daily Lucia Mccreadie, MD      . enoxaparin (LOVENOX) injection 40 mg  40 mg Subcutaneous Daily Adeline C Viyuoh, MD   40 mg at 02/13/11 0957  . furosemide (LASIX) injection 20 mg  20 mg Intravenous Q12H Kela Millin, MD   20 mg at 02/13/11 0129  . glimepiride (AMARYL) tablet 2 mg  2 mg Oral QAC breakfast Kela Millin, MD   2 mg at 02/13/11 0757  . insulin aspart (novoLOG) injection 0-15 Units  0-15 Units Subcutaneous TID WC Kela Millin, MD   3 Units at 02/13/11 0758  . insulin aspart (novoLOG) injection 0-5 Units  0-5 Units Subcutaneous QHS  Adeline C Viyuoh, MD      . levalbuterol (XOPENEX) nebulizer solution 0.63 mg  0.63 mg Nebulization Q3H PRN Kela Millin, MD   0.63 mg at 02/12/11 2250  . metoprolol tartrate (LOPRESSOR) tablet 25 mg  25 mg Oral BID Kela Millin, MD   25 mg at 02/13/11 0957  . ondansetron (ZOFRAN) tablet 4 mg  4 mg Oral Q6H PRN Adeline C Viyuoh, MD       Or  . ondansetron (ZOFRAN) injection 4 mg  4 mg Intravenous Q6H PRN Adeline C Viyuoh, MD      . simvastatin (ZOCOR) tablet 40 mg  40 mg Oral q1800 Kela Millin, MD   40 mg at 02/12/11 1743  . spironolactone (ALDACTONE) tablet 50 mg  50 mg Oral Daily Kela Millin, MD   50 mg at 02/13/11 0957  . zolpidem (AMBIEN) tablet 10 mg  10 mg Oral Once Shelba Flake, MD   10 mg at 02/13/11 0113  . DISCONTD: nitroGLYCERIN (NITROSTAT) SL tablet 0.4 mg  0.4 mg Sublingual Q5 min PRN Fayrene Helper, PA-C      . DISCONTD: nitroGLYCERIN 0.2 mg/mL in dextrose 5 % infusion  2-200 mcg/min Intravenous Titrated Kela Millin, MD   5 mcg/min at 02/13/11 0100   No Known Allergies Principal Problem:  *  SOB (shortness of breath) Active Problems:  DIABETES MELLITUS, TYPE II  HYPERTENSION  CARDIOMYOPATHY  CHF, acute on chronic  Leukocytosis  HTN (hypertension), malignant  Non compliance w medication regimen   Vital signs in last 24 hours: Temp:  [97.7 F (36.5 C)-97.9 F (36.6 C)] 97.7 F (36.5 C) (01/17 0800) Pulse Rate:  [52-102] 82  (01/17 1035) Resp:  [13-32] 18  (01/17 1035) BP: (120-191)/(73-142) 138/91 mmHg (01/17 1035) SpO2:  [94 %-100 %] 96 % (01/17 1035) Weight:  [81.2 kg (179 lb 0.2 oz)-82.2 kg (181 lb 3.5 oz)] 81.2 kg (179 lb 0.2 oz) (01/17 0500) Weight change:  Last BM Date: 02/13/11  Intake/Output from previous day: 01/16 0701 - 01/17 0700 In: 17.2 [I.V.:15.2; IV Piggyback:2] Out: 2210 [Urine:2210] Intake/Output this shift: Total I/O In: -  Out: 225 [Urine:225]  Lab Results:  [facility] 02/13/11 0035 02/12/11 1204  WBC 18.2* 21.7*    HGB 13.3 13.3  HCT 39.4 38.6*  PLT 366 371   BMET  Basename 02/13/11 0035 02/12/11 1204 02/12/11 0743  NA 136 -- 139  K 3.7 -- 3.8  CL 100 -- 106  CO2 28 -- --  GLUCOSE 165* -- 260*  BUN 29* -- 20  CREATININE 1.36* 1.14 --  CALCIUM 8.8 -- --    Studies/Results: Dg Chest 2 View  02/12/2011  *RADIOLOGY REPORT*  Clinical Data: Chest tightness.  Shortness of breath.  CHEST - 2 VIEW  Comparison: 01/08/2011  Findings: Patient minimally rotated the right.  Mild cardiomegaly. Trace bilateral pleural fluid. No pneumothorax.  Interstitial thickening is slightly increased.  There is thickening of the right minor fissure.  Minimal right base subsegmental atelectasis.  IMPRESSION:  1.  Cardiomegaly with mild interstitial thickening/venous congestion.  No overt congestive failure. 2.  Trace bilateral pleural effusions. 3.  Mild right base atelectasis.  Original Report Authenticated By: Consuello Bossier, M.D.   Ct Angio Chest W/cm &/or Wo Cm  02/12/2011  *RADIOLOGY REPORT*  Clinical Data:  Evaluate for pulmonary embolism.  Nonsmoker with history of diabetes, asthma, cardiomyopathy.  CT ANGIOGRAPHY OF THE CHEST  Technique:  Multidetector CT angiography of the chest was performed after contrast with bolus timed to evaluate the pulmonary arteries. Multiplanar CT image reconstructions including MIPs were obtained to evaluate the vascular anatomy.  Contrast:  100  ml Omnipaque-300  Comparison: Plain films, including earlier today.  No prior CT.  Findings: Lung windows demonstrate dependent opacity in the right lower lobe on image 72, likely atelectasis.  Upper lobe predominant smooth septal thickening is relatively mild.  Soft tissue windows:  The quality of this exam for evaluation of pulmonary embolism is good.  The primary limitation is motion artifact, primarily involving the lower lobes.  Apparent filling defects within segmental to subsegmental pulmonary artery branches in the right lower lobe, including  image 164 of series 7 are felt to be due to motion artifact, which is moderate in this area. No evidence of pulmonary embolism to the large segmental level.  Normal caliber of the thoracic aorta.  Not opacified secondary to bolus timing.  Moderate cardiomegaly.  No pericardial effusion. Small right greater than left pleural effusions.  Mild reflux of contrast in the IVC suggests elevated right heart pressures.  Small mediastinal nodes, without adenopathy.  Limited abdominal imaging demonstrates small splenules. No acute osseous abnormality.   Review of the MIP images confirms the above findings.  IMPRESSION:  1.  No evidence of pulmonary embolism to the large segmental level. Motion  degradation, primarily involving the lung bases. 2.  Findings of congestive heart failure. 3.  Probable atelectasis at the right lung base.  Recommend chest CT follow-up at 3 months to confirm stability or resolution.  Original Report Authenticated By: Consuello Bossier, M.D.    Medications: I have reviewed the patient's current medications.   Physical exam GENERAL- alert, sitting in recliner chair. HEAD- normal atraumatic, no neck masses, normal thyroid, no jvd RESPIRATORY- appears well, vitals normal, no respiratory distress, acyanotic, normal RR, ear and throat exam is normal, neck free of mass or lymphadenopathy, chest clear, no wheezing, crepitations, rhonchi, normal symmetric air entry CVS- regular rate and rhythm, S1, S2 normal, no murmur, click, rub or gallop ABDOMEN- abdomen is soft without significant tenderness, masses, organomegaly or guarding NEURO- Grossly normal EXTREMITIES- extremities normal, atraumatic, no cyanosis or edema  Plan   *Acute decompensation of systolic CHF(EF20%) ischemic versus non ischemic cardiomyopathy- better with diuresis, ntg. Continue beta blocker. Resume acei/spironolactone. Will consult [facility] ?further work up/aicd consideration. * DIABETES MELLITUS, TYPE II- controlled,  hb a1c 7.6. *HYPERTENSION- improving control- resume home meds.  * Leukocytosis- seems steroid induced. Improving. No evidence of infection.  *Disposition- transfer to telemetry.    Rodolfo Gaster 02/13/2011 11:04 AM Pager: [phone].

## 2011-02-13 NOTE — Consult Note (Signed)
Reason for Consult:Sob  Referring Physician: Zyeir Dymek is an 56 y.o. male.   HPI: The patient is a pleasant 56 yo man with a long standing h/o DCM and Chronic systolic heart failure. He has not sought medical attention in our office for several years. He presented to the ED one month ago with sob and diagnosed with Asthma and treated with bronchodilators. He was diagnosed with pneumonia several weeks ago. He presented yesterday with sob and is admitted with acute on chronic systolic CHF. He has severe LV dysfunction with an EF 25%. Prior cardiac cath demonstrated no obstructive disease. He denies anginal symptoms. No syncope or palpitations.  PMH: Past Medical History  Diagnosis Date  . HTN (hypertension)   . Cardiomyopathy   . DM2 (diabetes mellitus, type 2)   . Asthma   . CAD 04/20/2009  . CARDIOMYOPATHY 04/25/2009  . DIABETES MELLITUS, TYPE II 04/25/2009  . HYPERCHOLESTEROLEMIA 09/17/2009  . HYPERTENSION 04/20/2009  . PREMATURE VENTRICULAR CONTRACTIONS 09/17/2009  . PSA, INCREASED 04/26/2009    PSHX: Past Surgical History  Procedure Date  . Cardiac catheterization     FAMHX: Family History  Problem Relation Age of Onset  . Peripheral vascular disease    . Hypertension    . Prostate cancer    . Hypertension Mother   . Peripheral vascular disease Mother   . Prostate cancer Brother     Social History:  reports that he has never smoked. He has never used smokeless tobacco. He reports that he drinks alcohol. He reports that he does not use illicit drugs.  Allergies: No Known Allergies   Dg Chest 2 View  02/12/2011  *RADIOLOGY REPORT*  Clinical Data: Chest tightness.  Shortness of breath.  CHEST - 2 VIEW  Comparison: 01/08/2011  Findings: Patient minimally rotated the right.  Mild cardiomegaly. Trace bilateral pleural fluid. No pneumothorax.  Interstitial thickening is slightly increased.  There is thickening of the right minor fissure.  Minimal right base  subsegmental atelectasis.  IMPRESSION:  1.  Cardiomegaly with mild interstitial thickening/venous congestion.  No overt congestive failure. 2.  Trace bilateral pleural effusions. 3.  Mild right base atelectasis.  Original Report Authenticated By: Consuello Bossier, M.D.   Ct Angio Chest W/cm &/or Wo Cm  02/12/2011  *RADIOLOGY REPORT*  Clinical Data:  Evaluate for pulmonary embolism.  Nonsmoker with history of diabetes, asthma, cardiomyopathy.  CT ANGIOGRAPHY OF THE CHEST  Technique:  Multidetector CT angiography of the chest was performed after contrast with bolus timed to evaluate the pulmonary arteries. Multiplanar CT image reconstructions including MIPs were obtained to evaluate the vascular anatomy.  Contrast:  100  ml Omnipaque-300  Comparison: Plain films, including earlier today.  No prior CT.  Findings: Lung windows demonstrate dependent opacity in the right lower lobe on image 72, likely atelectasis.  Upper lobe predominant smooth septal thickening is relatively mild.  Soft tissue windows:  The quality of this exam for evaluation of pulmonary embolism is good.  The primary limitation is motion artifact, primarily involving the lower lobes.  Apparent filling defects within segmental to subsegmental pulmonary artery branches in the right lower lobe, including image 164 of series 7 are felt to be due to motion artifact, which is moderate in this area. No evidence of pulmonary embolism to the large segmental level.  Normal caliber of the thoracic aorta.  Not opacified secondary to bolus timing.  Moderate cardiomegaly.  No pericardial effusion. Small right greater than left pleural effusions.  Mild  reflux of contrast in the IVC suggests elevated right heart pressures.  Small mediastinal nodes, without adenopathy.  Limited abdominal imaging demonstrates small splenules. No acute osseous abnormality.   Review of the MIP images confirms the above findings.  IMPRESSION:  1.  No evidence of pulmonary embolism to the  large segmental level. Motion degradation, primarily involving the lung bases. 2.  Findings of congestive heart failure. 3.  Probable atelectasis at the right lung base.  Recommend chest CT follow-up at 3 months to confirm stability or resolution.  Original Report Authenticated By: Consuello Bossier, M.D.    ROS  As stated in the HPI and negative for all other systems.  Physical Exam  Vitals:Blood pressure 131/86, pulse 74, temperature 98.4 F (36.9 C), temperature source Oral, resp. rate 23, height 5\' 5"  (1.651 m), weight 81.2 kg (179 lb 0.2 oz), SpO2 96.00%.  Well appearing NAD HEENT: Unremarkable Neck:  7 cm JVD, no thyromegally Lymphatics:  No adenopathy Back:  No CVA tenderness Lungs:  Clear with minimal basilar rales. HEART:  Regular rate rhythm, no murmurs, no rubs, no clicks Abd:  Flat, positive bowel sounds, no organomegally, no rebound, no guarding Ext:  2 plus pulses, no edema, no cyanosis, no clubbing Skin:  No rashes no nodules Neuro:  CN II through XII intact, motor grossly intact  Assessment/Plan: 1. Acute on chronic systolic CHF 2. DCM 3. Medical non-compliance 4. HTN Rec: I would continue IV diuretics transitioning to po. Would stop metoprolol and switch to carvedilol and continue ACE inhibitor. A low sodium diet was emphasized as was regular daily exercise. Would repeat echo once he is back on full medical therapy and if his EF remains down, consider prophylactic ICD.  Sharlot Gowda TaylorMD 02/13/2011, 1:07 PM

## 2011-02-13 NOTE — Progress Notes (Signed)
eLink Physician-Brief Progress Note Patient Name: [name] DOB: 24-Jan-1956 MRN: [id number]  Date of Service  02/13/2011   HPI/Events of Note  Change in rate to bradycardia to the 30s to 40s with drop in sats to the 80s at times.  Had received ambien earlier for insomnia.  Admitted with CHF exacerbation.  Currently 98% with HR of 52 and RR of 17.  eICU Interventions  Plan: Check ABG Oxygen to maintain sats greater than 95%   Intervention Category Intermediate Interventions: Respiratory distress - evaluation and management;Arrhythmia - evaluation and management  Donni Oglesby 02/13/2011, 1:59 AM

## 2011-02-14 DIAGNOSIS — I509 Heart failure, unspecified: Secondary | ICD-10-CM

## 2011-02-14 LAB — COMPREHENSIVE METABOLIC PANEL
ALT: 52 U/L (ref 0–53)
AST: 22 U/L (ref 0–37)
Albumin: 2.9 g/dL — ABNORMAL LOW (ref 3.5–5.2)
Alkaline Phosphatase: 88 U/L (ref 39–117)
BUN: 33 mg/dL — ABNORMAL HIGH (ref 6–23)
CO2: 27 mEq/L (ref 19–32)
Calcium: 8.8 mg/dL (ref 8.4–10.5)
Chloride: 100 mEq/L (ref 96–112)
Creatinine, Ser: 1.5 mg/dL — ABNORMAL HIGH (ref 0.50–1.35)
GFR calc Af Amer: 58 mL/min — ABNORMAL LOW (ref >90)
GFR calc non Af Amer: 50 mL/min — ABNORMAL LOW (ref >90)
Glucose, Bld: 146 mg/dL — ABNORMAL HIGH (ref 70–99)
Potassium: 3.3 mEq/L — ABNORMAL LOW (ref 3.5–5.1)
Sodium: 135 mEq/L (ref 135–145)
Total Bilirubin: 0.3 mg/dL (ref 0.3–1.2)
Total Protein: 6.4 g/dL (ref 6.0–8.3)

## 2011-02-14 LAB — GLUCOSE, CAPILLARY
Glucose-Capillary: 212 mg/dL — ABNORMAL HIGH (ref 70–99)
Glucose-Capillary: 160 mg/dL — ABNORMAL HIGH (ref 70–99)
Glucose-Capillary: 107 mg/dL — ABNORMAL HIGH (ref 70–99)
Glucose-Capillary: 123 mg/dL — ABNORMAL HIGH (ref 70–99)
Glucose-Capillary: 177 mg/dL — ABNORMAL HIGH (ref 70–99)

## 2011-02-14 LAB — CBC
HCT: 40.5 % (ref 39.0–52.0)
Hemoglobin: 13.9 g/dL (ref 13.0–17.0)
MCH: 29.4 pg (ref 26.0–34.0)
MCHC: 34.3 g/dL (ref 30.0–36.0)
MCV: 85.6 fL (ref 78.0–100.0)
Platelets: 333 10*3/uL (ref 150–400)
RBC: 4.73 MIL/uL (ref 4.22–5.81)
RDW: 12.8 % (ref 11.5–15.5)
WBC: 14 10*3/uL — ABNORMAL HIGH (ref 4.0–10.5)

## 2011-02-14 LAB — PHOSPHORUS: Phosphorus: 4.5 mg/dL (ref 2.3–4.6)

## 2011-02-14 LAB — PRO B NATRIURETIC PEPTIDE: Pro B Natriuretic peptide (BNP): 476.1 pg/mL — ABNORMAL HIGH (ref 0–125)

## 2011-02-14 LAB — MAGNESIUM: Magnesium: 2.5 mg/dL (ref 1.5–2.5)

## 2011-02-14 MED ORDER — POTASSIUM CHLORIDE 20 MEQ/15ML (10%) PO LIQD
40.0000 meq | Freq: Once | ORAL | Status: AC
  Administered 2011-02-14: 40 meq via ORAL
  Filled 2011-02-14: qty 30

## 2011-02-14 MED ORDER — CARVEDILOL 6.25 MG PO TABS
6.2500 mg | ORAL_TABLET | Freq: Two times a day (BID) | ORAL | Status: DC
  Administered 2011-02-14 – 2011-02-15 (×3): 6.25 mg via ORAL
  Filled 2011-02-14 (×5): qty 1

## 2011-02-14 NOTE — Progress Notes (Signed)
Patient ID: [name], male   DOB: 11/29/1955, 56 y.o.   MRN: [id number]    SUBJECTIVE: Breathing much improved but still not back to baseline.  I/Os were not recorded yesterday.  He diuresed well on 1/16, however, on a very small Lasix dose.      [name] aspirin EC  81 mg Oral Daily  . benazepril  40 mg Oral Daily  . enoxaparin  40 mg Subcutaneous Daily  . furosemide  20 mg Intravenous Q12H  . glimepiride  2 mg Oral QAC breakfast  . influenza  inactive virus vaccine  0.5 mL Intramuscular Tomorrow-1000  . insulin aspart  0-15 Units Subcutaneous TID WC  . insulin aspart  0-5 Units Subcutaneous QHS  . metoprolol tartrate  25 mg Oral BID  . pneumococcal 23 valent vaccine  0.5 mL Intramuscular Tomorrow-1000  . simvastatin  40 mg Oral q1800  . spironolactone  50 mg Oral Daily      Filed Vitals:   02/13/11 1644 02/13/11 2155 02/14/11 0500 02/14/11 0550  BP: 127/83 121/77  123/83  Pulse: 74 75  65  Temp: 98 F (36.7 C) 98.3 F (36.8 C)  98.3 F (36.8 C)  TempSrc: Oral Oral  Oral  Resp: 22 16  20   Height: 5\' 5"  (1.651 m)     Weight: 81.557 kg (179 lb 12.8 oz)  81 kg (178 lb 9.2 oz)   SpO2: 95% 98%  98%    Intake/Output Summary (Last 24 hours) at 02/14/11 0750 Last data filed at 02/14/11 0032  Gross per 24 hour  Intake    366 ml  Output    225 ml  Net    141 ml    LABS: Basic Metabolic Panel:  Basename 02/14/11 0450 02/13/11 0035  NA 135 136  K 3.3* 3.7  CL 100 100  CO2 27 28  GLUCOSE 146* 165*  BUN 33* 29*  CREATININE 1.50* 1.36*  CALCIUM 8.8 8.8  MG 2.5 --  PHOS 4.5 --   Liver Function Tests:  Basename 02/14/11 0450 02/13/11 0035  AST 22 23  ALT 52 59*  ALKPHOS 88 90  BILITOT 0.3 0.3  PROT 6.4 6.4  ALBUMIN 2.9* 3.0*   No results found for this basename: LIPASE:2,AMYLASE:2 in the last 72 hours CBC:  Basename 02/14/11 0450 02/13/11 0035 02/12/11 0725  WBC 14.0* 18.2* --  NEUTROABS -- -- 18.7*  HGB 13.9 13.3 --  HCT 40.5 39.4 --  MCV 85.6 86.2 --    PLT 333 366 --   Cardiac Enzymes:  Basename 02/13/11 0035 02/12/11 1750 02/12/11 1150  CKTOTAL 48 53 57  CKMB 3.5 3.6 4.1*  CKMBINDEX -- -- --  TROPONINI <0.30 <0.30 <0.30   BNP: No components found with this basename: POCBNP:3 D-Dimer:  Basename 02/12/11 0725  DDIMER 0.52*   Hemoglobin A1C:  Basename 02/12/11 1204  HGBA1C 7.6*   Fasting Lipid Panel: No results found for this basename: CHOL,HDL,LDLCALC,TRIG,CHOLHDL,LDLDIRECT in the last 72 hours Thyroid Function Tests:  Basename 02/13/11 1150  TSH 1.773  T4TOTAL --  T3FREE --  THYROIDAB --   Anemia Panel: No results found for this basename: VITAMINB12,FOLATE,FERRITIN,TIBC,IRON,RETICCTPCT in the last 72 hours  RADIOLOGY: Dg Chest 2 View  02/12/2011  *RADIOLOGY REPORT*  Clinical Data: Chest tightness.  Shortness of breath.  CHEST - 2 VIEW  Comparison: 01/08/2011  Findings: Patient minimally rotated the right.  Mild cardiomegaly. Trace bilateral pleural fluid. No pneumothorax.  Interstitial thickening is slightly increased.  There is thickening  of the right minor fissure.  Minimal right base subsegmental atelectasis.  IMPRESSION:  1.  Cardiomegaly with mild interstitial thickening/venous congestion.  No overt congestive failure. 2.  Trace bilateral pleural effusions. 3.  Mild right base atelectasis.  Original Report Authenticated By: Consuello Bossier, M.D.    PHYSICAL EXAM General: NAD Neck: No JVD, no thyromegaly or thyroid nodule.  Lungs: Clear to auscultation bilaterally with normal respiratory effort. CV: Nondisplaced PMI.  Heart regular S1/S2, no S3/S4, +S4, 1/6 soft murmur apex.  No peripheral edema.  No carotid bruit.  Normal pedal pulses.  Abdomen: Soft, nontender, no hepatosplenomegaly, no distention.  Neurologic: Alert and oriented x 3.  Psych: Normal affect. Extremities: No clubbing or cyanosis.   ASSESSMENT AND PLAN:  56 yo with nonischemic CMP and DM presented with acute on chronic systolic CHF.   He has  not followed up regularly with his cardiologist (Dr. Antoine Poche).  Cath in 2005 showed minimal coronary disease.  Today he feels better and volume status actually looks pretty good.  He does not feel like he is back to his baseline yet.   - Would continue IV Lasix today with strict I/Os.  Will not increase Lasix as creatinine is rising and volume does not look bad.  Transition to po tomorrow.  - Stop metoprolol, start Coreg. - Continue benazepril and spironolactone. - Probably ready for discharge tomorrow.  - Will need close followup with Dr. Antoine Poche and repeat echo on medical treatment (probably will need ICD).   Marca Ancona 02/14/2011 7:54 AM

## 2011-02-14 NOTE — Progress Notes (Signed)
Appreciate cardiology.  SUBJECTIVE Feels great. No SOB.   1. CHF (congestive heart failure)   2. SOB (shortness of breath)   3. Hyperglycemia   4. CHF, acute on chronic     Past Medical History  Diagnosis Date  . HTN (hypertension)   . Cardiomyopathy   . DM2 (diabetes mellitus, type 2)   . Asthma   . CAD 04/20/2009  . CARDIOMYOPATHY 04/25/2009  . DIABETES MELLITUS, TYPE II 04/25/2009  . HYPERCHOLESTEROLEMIA 09/17/2009  . HYPERTENSION 04/20/2009  . PREMATURE VENTRICULAR CONTRACTIONS 09/17/2009  . PSA, INCREASED 04/26/2009   Current Facility-Administered Medications  Medication Dose Route Frequency Provider Last Rate Last Dose  . acetaminophen (TYLENOL) tablet 650 mg  650 mg Oral Q6H PRN Kela Millin, MD   650 mg at 02/13/11 2048   Or  . acetaminophen (TYLENOL) suppository 650 mg  650 mg Rectal Q6H PRN Adeline C Viyuoh, MD      . aspirin EC tablet 81 mg  81 mg Oral Daily Kela Millin, MD   81 mg at 02/14/11 0940  . benazepril (LOTENSIN) tablet 40 mg  40 mg Oral Daily Jahking Lesser, MD   40 mg at 02/14/11 0940  . carvedilol (COREG) tablet 6.25 mg  6.25 mg Oral BID WC Marca Ancona, MD   6.25 mg at 02/14/11 1706  . enoxaparin (LOVENOX) injection 40 mg  40 mg Subcutaneous Daily Adeline C Viyuoh, MD   40 mg at 02/14/11 0943  . furosemide (LASIX) injection 20 mg  20 mg Intravenous Q12H Adeline C Viyuoh, MD   20 mg at 02/14/11 1401  . glimepiride (AMARYL) tablet 2 mg  2 mg Oral QAC breakfast Kela Millin, MD   2 mg at 02/14/11 0811  . influenza  inactive virus vaccine (FLUZONE/FLUARIX) injection 0.5 mL  0.5 mL Intramuscular Tomorrow-1000 Jasun Gasparini, MD   0.5 mL at 02/14/11 0945  . insulin aspart (novoLOG) injection 0-15 Units  0-15 Units Subcutaneous TID WC Kela Millin, MD   2 Units at 02/14/11 1705  . insulin aspart (novoLOG) injection 0-5 Units  0-5 Units Subcutaneous QHS Kela Millin, MD   2 Units at 02/13/11 2224  . levalbuterol (XOPENEX) nebulizer solution 0.63  mg  0.63 mg Nebulization Q3H PRN Kela Millin, MD   0.63 mg at 02/12/11 2250  . ondansetron (ZOFRAN) tablet 4 mg  4 mg Oral Q6H PRN Adeline C Viyuoh, MD       Or  . ondansetron (ZOFRAN) injection 4 mg  4 mg Intravenous Q6H PRN Adeline C Viyuoh, MD      . pneumococcal 23 valent vaccine (PNU-IMMUNE) injection 0.5 mL  0.5 mL Intramuscular Tomorrow-1000 Makinze Jani, MD   0.5 mL at 02/14/11 0943  . potassium chloride 20 MEQ/15ML (10%) liquid 40 mEq  40 mEq Oral Once Marca Ancona, MD   40 mEq at 02/14/11 0941  . simvastatin (ZOCOR) tablet 40 mg  40 mg Oral q1800 Kela Millin, MD   40 mg at 02/14/11 1844  . spironolactone (ALDACTONE) tablet 50 mg  50 mg Oral Daily Adeline C Viyuoh, MD   50 mg at 02/14/11 1058  . DISCONTD: metoprolol tartrate (LOPRESSOR) tablet 25 mg  25 mg Oral BID Kela Millin, MD   25 mg at 02/13/11 2224   No Known Allergies Principal Problem:  *SOB (shortness of breath) Active Problems:  DIABETES MELLITUS, TYPE II  HYPERTENSION  CARDIOMYOPATHY  CHF, acute on chronic  Leukocytosis  HTN (hypertension), malignant  Non compliance w medication regimen   Vital signs in last 24 hours: Temp:  [98.1 F (36.7 C)-98.7 F (37.1 C)] 98.1 F (36.7 C) (01/18 2057) Pulse Rate:  [64-75] 75  (01/18 2057) Resp:  [16-22] 18  (01/18 2057) BP: (106-123)/(67-83) 118/79 mmHg (01/18 2057) SpO2:  [94 %-99 %] 99 % (01/18 2057) Weight:  [81 kg (178 lb 9.2 oz)] 81 kg (178 lb 9.2 oz) (01/18 0500) Weight change: -0.643 kg (-1 lb 6.7 oz) Last BM Date: 02/14/11  Intake/Output from previous day: 01/17 0701 - 01/18 0700 In: 366 [P.O.:360; IV Piggyback:6] Out: 225 [Urine:225] Intake/Output this shift: Total I/O In: -  Out: 500 [Urine:500]  Lab Results:  [facility] 02/14/11 0450 02/13/11 0035  WBC 14.0* 18.2*  HGB 13.9 13.3  HCT 40.5 39.4  PLT 333 366   BMET  Basename 02/14/11 0450 02/13/11 0035  NA 135 136  K 3.3* 3.7  CL 100 100  CO2 27 28  GLUCOSE 146* 165*  BUN  33* 29*  CREATININE 1.50* 1.36*  CALCIUM 8.8 8.8    Studies/Results: No results found.  Medications: I have reviewed the patient's current medications.   Physical exam GENERAL- alert HEAD- normal atraumatic, no neck masses, normal thyroid, no jvd RESPIRATORY- appears well, vitals normal, no respiratory distress, acyanotic, normal RR, ear and throat exam is normal, neck free of mass or lymphadenopathy, chest clear, no wheezing, crepitations, rhonchi, normal symmetric air entry CVS- regular rate and rhythm, S1, S2 normal, no murmur, click, rub or gallop ABDOMEN- abdomen is soft without significant tenderness, masses, organomegaly or guarding NEURO- Grossly normal EXTREMITIES- extremities normal, atraumatic, no cyanosis or edema  Plan  *Acute decompensation of systolic CHF(EF20%) ischemic versus non ischemic cardiomyopathy-continues to do better with diuresis, ntg. Continue beta blocker. Resumed acei/spironolactone.  * DIABETES MELLITUS, TYPE II- controlled, hb a1c 7.6. *HYPERTENSION- improved control- resume home meds.  . *Disposition-hopefully home in am.     Myracle Febres 02/14/2011 9:00 PM Pager: [phone].

## 2011-02-15 LAB — BASIC METABOLIC PANEL
BUN: 30 mg/dL — ABNORMAL HIGH (ref 6–23)
CO2: 27 mEq/L (ref 19–32)
Calcium: 9 mg/dL (ref 8.4–10.5)
Chloride: 100 mEq/L (ref 96–112)
Creatinine, Ser: 1.46 mg/dL — ABNORMAL HIGH (ref 0.50–1.35)
GFR calc Af Amer: 60 mL/min — ABNORMAL LOW (ref >90)
GFR calc non Af Amer: 52 mL/min — ABNORMAL LOW (ref >90)
Glucose, Bld: 141 mg/dL — ABNORMAL HIGH (ref 70–99)
Potassium: 3.7 mEq/L (ref 3.5–5.1)
Sodium: 137 mEq/L (ref 135–145)

## 2011-02-15 LAB — GLUCOSE, CAPILLARY
Glucose-Capillary: 118 mg/dL — ABNORMAL HIGH (ref 70–99)
Glucose-Capillary: 175 mg/dL — ABNORMAL HIGH (ref 70–99)
Glucose-Capillary: 117 mg/dL — ABNORMAL HIGH (ref 70–99)

## 2011-02-15 MED ORDER — CARVEDILOL 6.25 MG PO TABS: 6.2500 mg | ORAL_TABLET | Freq: Two times a day (BID) | ORAL | Status: DC

## 2011-02-15 MED ORDER — GLIMEPIRIDE 2 MG PO TABS: 2.0000 mg | ORAL_TABLET | Freq: Every day | ORAL | Status: DC

## 2011-02-15 MED ORDER — SPIRONOLACTONE 25 MG PO TABS: 50.0000 mg | ORAL_TABLET | Freq: Every day | ORAL | Status: DC

## 2011-02-15 MED ORDER — FUROSEMIDE 40 MG PO TABS: 40.0000 mg | ORAL_TABLET | Freq: Every day | ORAL | Status: DC

## 2011-02-15 MED ORDER — BENAZEPRIL HCL 40 MG PO TABS: 40.0000 mg | ORAL_TABLET | Freq: Every day | ORAL | Status: DC

## 2011-02-15 MED ORDER — ATORVASTATIN CALCIUM 20 MG PO TABS: 20.0000 mg | ORAL_TABLET | Freq: Every day | ORAL | Status: DC

## 2011-02-15 MED ORDER — METFORMIN HCL 500 MG PO TABS: 500.0000 mg | ORAL_TABLET | Freq: Two times a day (BID) | ORAL | Status: DC

## 2011-02-15 NOTE — Progress Notes (Signed)
Discharged to home, ambulatory, p/u by son. PIV removed no s/s of  Swelling or infiltration. D/c instruction , CHF handouts discussed and given to patient verbalized understanding.

## 2011-02-15 NOTE — Discharge Summary (Signed)
DISCHARGE SUMMARY  [name]  MR#: [id number]  DOB:July 18, 1955  Date of Admission: 02/12/2011 Date of Discharge: 02/15/2011  Attending Physician:Ragena Fiola  Patient's EXB:[id number] Bradley Coyer, MD, MD  Consults:Treatment Team:  Dory Peru Lbcardiology, MD  Discharge Diagnoses: Present on Admission:  .DIABETES MELLITUS, TYPE II .HYPERTENSION .CARDIOMYOPATHY, nonischemic. .SOB (shortness of breath) .CHF, acute on chronic, EF 20% .Leukocytosis- steroid induced. Marland KitchenHTN (hypertension), malignant    Current Discharge Medication List    START taking these medications   Details  carvedilol (COREG) 6.25 MG tablet Take 1 tablet (6.25 mg total) by mouth 2 (two) times daily with a meal. Qty: 60 tablet, Refills: 0    furosemide (LASIX) 40 MG tablet Take 1 tablet (40 mg total) by mouth daily before breakfast. Qty: 30 tablet, Refills: 0      CONTINUE these medications which have CHANGED   Details  atorvastatin (LIPITOR) 20 MG tablet Take 1 tablet (20 mg total) by mouth daily. Qty: 30 tablet, Refills: 0    benazepril (LOTENSIN) 40 MG tablet Take 1 tablet (40 mg total) by mouth daily. Qty: 30 tablet, Refills: 0    glimepiride (AMARYL) 2 MG tablet Take 1 tablet (2 mg total) by mouth daily before breakfast. Qty: 30 tablet, Refills: 0    metFORMIN (GLUCOPHAGE) 500 MG tablet Take 1 tablet (500 mg total) by mouth 2 (two) times daily with a meal. Qty: 60 tablet, Refills: 0    spironolactone (ALDACTONE) 25 MG tablet Take 2 tablets (50 mg total) by mouth daily. Qty: 30 tablet, Refills: 0      CONTINUE these medications which have NOT CHANGED   Details  albuterol (PROVENTIL HFA;VENTOLIN HFA) 108 (90 BASE) MCG/ACT inhaler Inhale 2 puffs into the lungs every 6 (six) hours as needed. For shortness of breath.    aspirin EC 81 MG tablet Take 81 mg by mouth daily.      STOP taking these medications     azithromycin (ZITHROMAX) 250 MG tablet      amLODipine (NORVASC) 5 MG tablet      Aspirin-Acetaminophen (GOODY BODY PAIN) 500-325 MG PACK      hydrochlorothiazide (,MICROZIDE/HYDRODIURIL,) 12.5 MG capsule      predniSONE (DELTASONE) 20 MG tablet      sildenafil (VIAGRA) 50 MG tablet          Hospital Course: This pleasant 56 year old male with history of nonischemic cardiomyopathy with an EF of 20%, and noncompliant with meds, came with progressive shortness of breath. He had been managed as acute bronchitis outpatient with no significant improvement. He was found to be in acute decompensation of systolic chf, and a 2d echo showed an ef of 20% from 40%.  cardiology graciously helped with his management. Patient had not been compliant with meds for a period of time. He has done well with diuresis and is tolerating meds. There was a slight bump in his bun/cr due to diuretics but the hope is that this stabilizes, and will need follow up in a week or so. He also had steroid induced leukocytosis, and this has been trending towards normal since steroids were discontinued. Not surprisingly, patient had accelerated htn at admission but the BP has also trended towards normal with resumption of regular antihypertensives. He had a stint in the ICU for IV nitro which he was weaned off of pretty quickly. He seems at baseline and will d/c to follow Dr [name] and Dr Antoine Poche in the next week or so. I have asked him to call the office for  appointment. Have given him CHF discharge instructions.   Day of Discharge BP 125/84  Pulse 73  Temp(Src) 98.5 F (36.9 C) (Oral)  Resp 18  Ht 5\' 5"  (1.651 m)  Wt 80.6 kg (177 lb 11.1 oz)  BMI 29.57 kg/m2  SpO2 99%  Physical Exam: Normal.  Results for orders placed during the hospital encounter of 02/12/11 (from the past 24 hour(s))  GLUCOSE, CAPILLARY     Status: Abnormal   Collection Time   02/14/11 11:52 AM      Component Value Range   Glucose-Capillary 107 (*) 70 - 99 (mg/dL)  GLUCOSE, CAPILLARY     Status: Abnormal   Collection  Time   02/14/11  4:50 PM      Component Value Range   Glucose-Capillary 123 (*) 70 - 99 (mg/dL)  GLUCOSE, CAPILLARY     Status: Abnormal   Collection Time   02/14/11  9:00 PM      Component Value Range   Glucose-Capillary 177 (*) 70 - 99 (mg/dL)   Comment 1 Notify RN     Comment 2 Documented in Chart    BASIC METABOLIC PANEL     Status: Abnormal   Collection Time   02/15/11  6:05 AM      Component Value Range   Sodium 137  135 - 145 (mEq/L)   Potassium 3.7  3.5 - 5.1 (mEq/L)   Chloride 100  96 - 112 (mEq/L)   CO2 27  19 - 32 (mEq/L)   Glucose, Bld 141 (*) 70 - 99 (mg/dL)   BUN 30 (*) 6 - 23 (mg/dL)   Creatinine, Ser 2.95 (*) 0.50 - 1.35 (mg/dL)   Calcium 9.0  8.4 - 62.1 (mg/dL)   GFR calc non Af Amer 52 (*) >90 (mL/min)   GFR calc Af Amer 60 (*) >90 (mL/min)  GLUCOSE, CAPILLARY     Status: Abnormal   Collection Time   02/15/11  8:09 AM      Component Value Range   Glucose-Capillary 175 (*) 70 - 99 (mg/dL)   Comment 1 Notify RN    GLUCOSE, CAPILLARY     Status: Abnormal   Collection Time   02/15/11 11:26 AM      Component Value Range   Glucose-Capillary 117 (*) 70 - 99 (mg/dL)   Comment 1 Notify RN      Disposition: home today.   Follow-up Appts: Discharge Orders    Future Orders Please Complete By Expires   Diet - low sodium heart healthy      Increase activity slowly         Follow-up Information    Follow up with Rene Paci, MD .         Tests Needing Follow-up: BMP in 1 week.  Time spent in discharge (includes decision making & examination of pt): 20 minutes  Signed: Hettie Roselli 02/15/2011, 11:31 AM

## 2011-02-15 NOTE — Progress Notes (Signed)
Subjective:  Feels better and not SOB.  Wants to go home.   Objective:  Vital Signs in the last 24 hours: BP 125/84  Pulse 73  Temp(Src) 98.5 F (36.9 C) (Oral)  Resp 18  Ht 5\' 5"  (1.651 m)  Wt 80.6 kg (177 lb 11.1 oz)  BMI 29.57 kg/m2  SpO2 99%  Physical Exam: Pleasant BM in NAD Lungs:  Clear to A&P Cardiac:  Regular rhythm, normal S1 and S2, no S3 Extremities:  No edema present  Intake/Output from previous day: 01/18 0701 - 01/19 0700 In: 720 [P.O.:720] Out: 2275 [Urine:2275] Weight change: -0.957 kg (-2 lb 1.8 oz)  Lab Results: Basic Metabolic Panel:  Basename 02/15/11 0605 02/14/11 0450  NA 137 135  K 3.7 3.3*  CL 100 100  CO2 27 27  GLUCOSE 141* 146*  BUN 30* 33*  CREATININE 1.46* 1.50*    CBC:  Basename 02/14/11 0450 02/13/11 0035  WBC 14.0* 18.2*  NEUTROABS -- --  HGB 13.9 13.3  HCT 40.5 39.4  MCV 85.6 86.2  PLT 333 366    PROTIME: Lab Results  Component Value Date   INR 1.05 05/03/2009    Telemetry: Reviewed, NSR.   Assessment/Plan:  1. Acute on chronic systolic CHF improved clinically  2. Dilated cardiomyopathy 3. Medical non-compliance by history 4. HTN 5. Chronic kidney disease stage 3 stable  REC:  Transition to po meds.  On d/c he will need to f/u with Dr. Antoine Poche in 1 week.  Needs intensive CHF education.   Darden Palmer.  MD [facility] 02/15/2011, 8:30 AM

## 2011-03-18 ENCOUNTER — Other Ambulatory Visit: Payer: Self-pay | Admitting: *Deleted

## 2011-03-18 MED ORDER — BENAZEPRIL HCL 40 MG PO TABS: 40.0000 mg | ORAL_TABLET | Freq: Every day | ORAL | Status: DC

## 2011-03-18 MED ORDER — FUROSEMIDE 40 MG PO TABS: 40.0000 mg | ORAL_TABLET | Freq: Every day | ORAL | Status: DC

## 2011-03-18 MED ORDER — SPIRONOLACTONE 25 MG PO TABS: 50.0000 mg | ORAL_TABLET | Freq: Every day | ORAL | Status: DC

## 2011-03-18 MED ORDER — GLIMEPIRIDE 2 MG PO TABS: 2.0000 mg | ORAL_TABLET | Freq: Every day | ORAL | Status: DC

## 2011-03-18 MED ORDER — ATORVASTATIN CALCIUM 20 MG PO TABS: 20.0000 mg | ORAL_TABLET | Freq: Every day | ORAL | Status: DC

## 2011-03-18 MED ORDER — METFORMIN HCL 500 MG PO TABS: 500.0000 mg | ORAL_TABLET | Freq: Two times a day (BID) | ORAL | Status: DC

## 2011-03-18 MED ORDER — CARVEDILOL 6.25 MG PO TABS: 6.2500 mg | ORAL_TABLET | Freq: Two times a day (BID) | ORAL | Status: DC

## 2011-03-18 NOTE — Telephone Encounter (Signed)
Called pt no answer LMOM will need to see md last ov 04/25/09... 03/18/11@12 :27pm/LMB

## 2011-03-18 NOTE — Telephone Encounter (Signed)
Pt fill-out walk-in slip needing refills sent on atorvastatin, benazepril, furosemide, coreg, glimepiride, metformin and spironlactolone sent to  [facility] on [address].... 03/18/11@10 :02am/LMB

## 2011-03-18 NOTE — Telephone Encounter (Signed)
Pt called back left msg  made appt to see md on 03/26/11, and needing refill until appt. Called pt back no answer left msg will send in 30 day supply to [facility] until appt.... 03/18/11@1 :38pm/LMB

## 2011-03-26 ENCOUNTER — Ambulatory Visit (INDEPENDENT_AMBULATORY_CARE_PROVIDER_SITE_OTHER): Payer: BC Managed Care – PPO | Admitting: Internal Medicine

## 2011-03-26 ENCOUNTER — Encounter: Payer: Self-pay | Admitting: Internal Medicine

## 2011-03-26 DIAGNOSIS — E119 Type 2 diabetes mellitus without complications: Secondary | ICD-10-CM

## 2011-03-26 DIAGNOSIS — I428 Other cardiomyopathies: Secondary | ICD-10-CM

## 2011-03-26 DIAGNOSIS — E7800 Pure hypercholesterolemia, unspecified: Secondary | ICD-10-CM

## 2011-03-26 DIAGNOSIS — Z0279 Encounter for issue of other medical certificate: Secondary | ICD-10-CM

## 2011-03-26 DIAGNOSIS — I10 Essential (primary) hypertension: Secondary | ICD-10-CM

## 2011-03-26 MED ORDER — SPIRONOLACTONE 50 MG PO TABS: 50.0000 mg | ORAL_TABLET | Freq: Every day | ORAL | Status: DC

## 2011-03-26 MED ORDER — CARVEDILOL 12.5 MG PO TABS: 12.5000 mg | ORAL_TABLET | Freq: Two times a day (BID) | ORAL | Status: DC

## 2011-03-26 MED ORDER — ATORVASTATIN CALCIUM 20 MG PO TABS: 20.0000 mg | ORAL_TABLET | Freq: Every day | ORAL | Status: DC

## 2011-03-26 NOTE — Assessment & Plan Note (Signed)
Reminded of need for statin compliance -  re-rx atorvastatin and plan to check cholesterol next visit

## 2011-03-26 NOTE — Assessment & Plan Note (Addendum)
NICM, LVEF 20% echo 1/13 IP reviewed exac by uncontrolled HTN and noncompliance 01/2011 - reviewed same with pt today euvolemic on exam, increase beta-blocker as above Reminded of importance of compliance - pt agrees to commit to same and follow up with cards as planned

## 2011-03-26 NOTE — Progress Notes (Signed)
Subjective:    Patient ID: [name], male    DOB: Mar 09, 1955, 56 y.o.   MRN: [id number]  HPI  Here for followup - last office visit March 2011, medical care complicated by noncompliance Reviewed chronic medical issues today:  DM2 - remotely prescribed insulin (lantus, 70/30), changed to oral medications March 2011 - noncompliance complicated by cost of medication-  history of [facility] August 2009- denies abdominal pain, nausea and vomiting or weight changes - denies polyuria or thirst - no blurred vision - does not check cbgs -    HTN, malignant - hosp 01/2011 related to uncontrolled BP with CHF - frequent med noncompliance- denies headache, chest pain - no feet swelling or vision changes -     NICM with hx CHF - exacerbation 01/2011 hospitalization for same related to malignant HTN - follows with cards intermittently for same- no edema, chest pain, shortness of breath - follows no salt diet very carefully but noncompliant with med rx as above  Past Medical History  Diagnosis Date  . Asthma   . CAD     nonobst cath 2004  . CARDIOMYOPATHY     NICM, LVEF 30% 01/2011 echo  . DIABETES MELLITUS, TYPE II   . HYPERCHOLESTEROLEMIA   . HYPERTENSION   . PSA, INCREASED   . Noncompliance      Review of Systems  Constitutional: Negative for fever and fatigue.  Eyes: Negative for visual disturbance.  Respiratory: Negative for cough and shortness of breath.   Cardiovascular: Negative for chest pain, palpitations and leg swelling.  Neurological: Negative for dizziness and headaches.       Objective:   Physical Exam BP 152/100  Pulse 81  Temp(Src) 97.9 F (36.6 C) (Oral)  Ht 5\' 5"  (1.651 m)  Wt 187 lb 9.6 oz (85.095 kg)  BMI 31.22 kg/m2  SpO2 97% Wt Readings from Last 3 Encounters:  03/26/11 187 lb 9.6 oz (85.095 kg)  02/15/11 177 lb 11.1 oz (80.6 kg)  02/08/11 185 lb (83.915 kg)   Constitutional:  He appears well-developed and well-nourished. No distress.  Neck: Normal  range of motion. Neck supple. No JVD present. No thyromegaly present.  Cardiovascular: Normal rate, regular rhythm and normal heart sounds.  No murmur heard. no BLE edema Pulmonary/Chest: Effort normal and breath sounds normal. No respiratory distress. no wheezes.  Abdominal: Soft. Bowel sounds are normal. Patient exhibits no distension. There is no tenderness.  Musculoskeletal: Normal range of motion. Patient exhibits no edema.  Neurological: he is alert and oriented to person, place, and time. No cranial nerve deficit. Coordination normal.  Skin: Skin is warm and dry.  No erythema or ulceration.  Psychiatric: he has a normal mood and affect. behavior is normal. Judgment and thought content normal.   Lab Results  Component Value Date   WBC 14.0* 02/14/2011   HGB 13.9 02/14/2011   HCT 40.5 02/14/2011   PLT 333 02/14/2011   GLUCOSE 141* 02/15/2011   CHOL 210* 04/25/2009   TRIG 91.0 04/25/2009   HDL 44.90 04/25/2009   LDLDIRECT 148.8 04/25/2009   LDLCALC 104 09/20/2007   ALT 52 02/14/2011   AST 22 02/14/2011   NA 137 02/15/2011   K 3.7 02/15/2011   CL 100 02/15/2011   CREATININE 1.46* 02/15/2011   BUN 30* 02/15/2011   CO2 27 02/15/2011   TSH 1.773 02/13/2011   PSA 7.80* 04/25/2009   INR 1.05 05/03/2009   HGBA1C 7.6* 02/12/2011       Assessment & Plan:  See problem list. Medications and labs reviewed today.

## 2011-03-26 NOTE — Assessment & Plan Note (Signed)
Variable medication compliance reviewed - encouraged improved efforts at compliance Lab Results  Component Value Date   HGBA1C 7.6* 02/12/2011  on ARB, encouraged statin compliance The current medical regimen is effective;  continue present plan and medications.   

## 2011-03-26 NOTE — Patient Instructions (Signed)
It was good to see you today. We have reviewed your prior records including labs and tests today Medications reviewed, increase dose of Coreg to 12.5 milligrams twice daily for blood pressure and heart management; also begin atorvastatin for cholesterol as prescribed Your prescription(s) have been submitted to your pharmacy. Please take as directed and contact our office if you believe you are having problem(s) with the medication(s). glucometer given to you today, supplies and strips submitted to your pharmacy  - you should check your sugars once daily before breakfast and call if over 150 Other medications reviewed, refills provided Arrange follow up appointment with cardiology as discussed Please schedule followup in 3-4 months for blood pressure and diabetes mellitus check, call sooner if problems.

## 2011-03-26 NOTE — Assessment & Plan Note (Signed)
Uncontrolled, titrate beta blocker now BP Readings from Last 3 Encounters:  03/26/11 152/100  02/15/11 125/84  02/08/11 167/103   Reviewed relationship between uncontrolled blood pressure with CHF exacerbation and progressive renal disease Encourage compliance with medications

## 2011-06-18 ENCOUNTER — Ambulatory Visit: Payer: BC Managed Care – PPO | Admitting: Internal Medicine

## 2011-06-18 DIAGNOSIS — Z0289 Encounter for other administrative examinations: Secondary | ICD-10-CM

## 2011-07-19 ENCOUNTER — Encounter (HOSPITAL_COMMUNITY): Payer: Self-pay

## 2011-07-19 ENCOUNTER — Emergency Department (HOSPITAL_COMMUNITY)
Admission: EM | Admit: 2011-07-19 | Discharge: 2011-07-19 | Disposition: A | Discharge status: Home / Self Care | Payer: BC Managed Care – PPO | Attending: Emergency Medicine | Admitting: Emergency Medicine

## 2011-07-19 DIAGNOSIS — J45909 Unspecified asthma, uncomplicated: Secondary | ICD-10-CM | POA: Insufficient documentation

## 2011-07-19 DIAGNOSIS — E119 Type 2 diabetes mellitus without complications: Secondary | ICD-10-CM | POA: Insufficient documentation

## 2011-07-19 DIAGNOSIS — I2510 Atherosclerotic heart disease of native coronary artery without angina pectoris: Secondary | ICD-10-CM | POA: Insufficient documentation

## 2011-07-19 DIAGNOSIS — I519 Heart disease, unspecified: Secondary | ICD-10-CM | POA: Insufficient documentation

## 2011-07-19 DIAGNOSIS — R040 Epistaxis: Secondary | ICD-10-CM | POA: Insufficient documentation

## 2011-07-19 DIAGNOSIS — N400 Enlarged prostate without lower urinary tract symptoms: Secondary | ICD-10-CM | POA: Insufficient documentation

## 2011-07-19 DIAGNOSIS — Z8042 Family history of malignant neoplasm of prostate: Secondary | ICD-10-CM | POA: Insufficient documentation

## 2011-07-19 DIAGNOSIS — I10 Essential (primary) hypertension: Secondary | ICD-10-CM | POA: Insufficient documentation

## 2011-07-19 DIAGNOSIS — Z8249 Family history of ischemic heart disease and other diseases of the circulatory system: Secondary | ICD-10-CM | POA: Insufficient documentation

## 2011-07-19 DIAGNOSIS — Z888 Allergy status to other drugs, medicaments and biological substances status: Secondary | ICD-10-CM | POA: Insufficient documentation

## 2011-07-19 DIAGNOSIS — Z91199 Patient's noncompliance with other medical treatment and regimen due to unspecified reason: Secondary | ICD-10-CM | POA: Insufficient documentation

## 2011-07-19 DIAGNOSIS — Z9119 Patient's noncompliance with other medical treatment and regimen: Secondary | ICD-10-CM | POA: Insufficient documentation

## 2011-07-19 DIAGNOSIS — E7800 Pure hypercholesterolemia, unspecified: Secondary | ICD-10-CM | POA: Insufficient documentation

## 2011-07-19 DIAGNOSIS — Z7982 Long term (current) use of aspirin: Secondary | ICD-10-CM | POA: Insufficient documentation

## 2011-07-19 MED ORDER — OXYMETAZOLINE HCL 0.05 % NA SOLN
1.0000 | Freq: Once | NASAL | Status: AC
  Administered 2011-07-19: 1 via NASAL
  Filled 2011-07-19: qty 15

## 2011-07-19 NOTE — Discharge Instructions (Signed)
Hypertension Hypertension is another name for high blood pressure. High blood pressure may mean that your heart needs to work harder to pump blood. Blood pressure consists of two numbers, which includes a higher number over a lower number (example: 110/72). HOME CARE   Make lifestyle changes as told by your doctor. This may include weight loss and exercise.   Take your blood pressure medicine every day.   Limit how much salt you use.   Stop smoking if you smoke.   Do not use drugs.   Talk to your doctor if you are using decongestants or birth control pills. These medicines might make blood pressure higher.   Females should not drink more than 1 alcoholic drink per day. Males should not drink more than 2 alcoholic drinks per day.   See your doctor as told.  GET HELP RIGHT AWAY IF:   You have a blood pressure reading with a top number of 180 or higher.   You get a very bad headache.   You get blurred or changing vision.   You feel confused.   You feel weak, numb, or faint.   You get chest or belly (abdominal) pain.   You throw up (vomit).   You cannot breathe very well.  MAKE SURE YOU:   Understand these instructions.   Will watch your condition.   Will get help right away if you are not doing well or get worse.  Document Released: 07/02/2007 Document Revised: 01/02/2011 Document Reviewed: 07/02/2007 [facility] Patient Information 2012 [place], [place].  Use Afrin nasal spray, one in each nostril, twice a day, for 3 days. If the nosebleeds last more than 1 hour while applying pressure, return to the emergency department. See your Dr. for blood pressure check in one week.  Nosebleed Nosebleeds can be caused by many conditions including trauma, infections, polyps, foreign bodies, dry mucous membranes or climate, medications and air conditioning. Most nosebleeds occur in the front of the nose. It is because of this location that most nosebleeds can be controlled by  pinching the nostrils gently and continuously. Do this for at least 10 to 20 minutes. The reason for this long continuous pressure is that you must hold it long enough for the blood to clot. If during that 10 to 20 minute time period, pressure is released, the process may have to be started again. The nosebleed may stop by itself, quit with pressure, need concentrated heating (cautery) or stop with pressure from packing. HOME CARE INSTRUCTIONS   If your nose was packed, try to maintain the pack inside until your caregiver removes it. If a gauze pack was used and it starts to fall out, gently replace or cut the end off. Do not cut if a balloon catheter was used to pack the nose. Otherwise, do not remove unless instructed.   Avoid blowing your nose for 12 hours after treatment. This could dislodge the pack or clot and start bleeding again.   If the bleeding starts again, sit up and bending forward, gently pinch the front half of your nose continuously for 20 minutes.   If bleeding was caused by dry mucous membranes, cover the inside of your nose every morning with a petroleum or antibiotic ointment. Use your little fingertip as an applicator. Do this as needed during dry weather. This will keep the mucous membranes moist and allow them to heal.   Maintain humidity in your home by using less air conditioning or using a humidifier.  Do not use aspirin or medications which make bleeding more likely. Your caregiver can give you recommendations on this.   Resume normal activities as able but try to avoid straining, lifting or bending at the waist for several days.   If the nosebleeds become recurrent and the cause is unknown, your caregiver may suggest laboratory tests.  SEEK IMMEDIATE MEDICAL CARE IF:   Bleeding recurs and cannot be controlled.   There is unusual bleeding from or bruising on other parts of the body.   You have a fever.   Nosebleeds continue.   There is any worsening of the  condition which originally brought you in.   You become lightheaded, feel faint, become sweaty or vomit blood.  MAKE SURE YOU:   Understand these instructions.   Will watch your condition.   Will get help right away if you are not doing well or get worse.  Document Released: 10/23/2004 Document Revised: 01/02/2011 Document Reviewed: 12/15/2008 [facility] Patient Information 2012 [place], [place].

## 2011-07-19 NOTE — ED Notes (Signed)
States did not take HTN meds today

## 2011-07-19 NOTE — ED Notes (Signed)
Discharge instructions reviewed w/ pt., verbalzies understanding. No prescriptions provided at discharge. 

## 2011-07-19 NOTE — ED Notes (Signed)
After signing discharge signature, epistaxis resumed. Pt is lying back applying pressure. Dr. Effie Shy aware, orders given

## 2011-07-19 NOTE — ED Notes (Signed)
Onset on nose bleed this afternoon while taking a nap. Bleeding has subsided. Pt hx of HTN has not been taking meds for about one week.

## 2011-07-19 NOTE — ED Notes (Signed)
Pt in with c/o nosebleed right nare states onset 1500 states 81mg  asa qday hx of htn controlled at present

## 2011-07-19 NOTE — ED Provider Notes (Signed)
History     CSN: [id number]  Arrival date & time 07/19/11  1536   First MD Initiated Contact with Patient 07/19/11 1606      Chief Complaint  Patient presents with  . Epistaxis    (Consider location/radiation/quality/duration/timing/severity/associated sxs/prior treatment) HPI [name] is a 56 y.o. male who had spontaneous onset, right nares bleeding, at 3 PM today. It improves with pressure, and has not recurred. No recent sinus symptoms, URI symptoms, chest symptoms. He uses aspirin as prophylaxis. No recent illnesses. He denies aggravating or alleviating factors    Past Medical History  Diagnosis Date  . Asthma   . CAD     nonobst cath 2004  . CARDIOMYOPATHY     NICM, LVEF 30% 01/2011 echo  . DIABETES MELLITUS, TYPE II   . HYPERCHOLESTEROLEMIA   . HYPERTENSION   . PSA, INCREASED   . Noncompliance     Past Surgical History  Procedure Date  . Cardiac catheterization 2004    nonobst dz    Family History  Problem Relation Age of Onset  . Peripheral vascular disease    . Hypertension    . Prostate cancer    . Hypertension Mother   . Peripheral vascular disease Mother   . Prostate cancer Brother     History  Substance Use Topics  . Smoking status: Never Smoker   . 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 Alcohol Use: Yes     rare      Review of Systems  All other systems reviewed and are negative.    Allergies  Ambien  Home Medications   Current Outpatient Rx  Name Route Sig Dispense Refill  . ASPIRIN EC 81 MG PO TBEC Oral Take 81 mg by mouth daily.    . ATORVASTATIN CALCIUM 20 MG PO TABS Oral Take 20 mg by mouth daily.    [name] BENAZEPRIL HCL 40 MG PO TABS Oral Take 40 mg by mouth daily.    [name] CARVEDILOL 12.5 MG PO TABS Oral Take 12.5 mg by mouth 2 (two) times daily with a meal.    . FUROSEMIDE 40 MG PO TABS Oral Take 40 mg by mouth daily.    [name] GLIMEPIRIDE 2 MG PO TABS Oral Take 2 mg  by mouth daily before breakfast.    . METFORMIN HCL 500 MG PO TABS Oral Take 500 mg by mouth 2 (two) times daily with a meal.    . SPIRONOLACTONE 50 MG PO TABS Oral Take 50 mg by mouth daily.      BP 193/119  Pulse 114  Resp 18  SpO2 100%  Physical Exam  Nursing note and vitals reviewed. Constitutional: He is oriented to person, place, and time. He appears well-developed and well-nourished.  HENT:  Head: Normocephalic and atraumatic.  Right Ear: External ear normal.  Left Ear: External ear normal.       Right nares with mucosal swelling, and several punctate areas of excoriation consistent with site of recent bleeding. No clot in the nares.  Eyes: Conjunctivae and EOM are normal. Pupils are equal, round, and reactive to light.  Neck: Normal range of motion and phonation normal. Neck supple.  Cardiovascular: Normal rate.   Pulmonary/Chest: Effort normal and breath sounds normal. He exhibits no bony tenderness.  Abdominal: Normal appearance.  Musculoskeletal: Normal range of motion.  Neurological: He is alert and oriented to person, place, and time. He has normal strength.  No cranial nerve deficit or sensory deficit. He exhibits normal muscle tone. Coordination normal.  Skin: Skin is warm, dry and intact.  Psychiatric: He has a normal mood and affect. His behavior is normal. Judgment and thought content normal.    ED Course  Procedures (including critical care time)  Blood pressure repeat: 176/98    Labs Reviewed - No data to display No results found.   1. Epistaxis       MDM  Minor bleeding, without clear cause, differential, diagnosis rhinitis, URI, and local trauma.   Plan: Home Medications- Afrin for 3 days; Home Treatments- rest; Recommended follow up- PCP prn       Flint Melter, MD 07/20/11 [phone]

## 2011-07-20 ENCOUNTER — Other Ambulatory Visit: Payer: Self-pay | Admitting: Internal Medicine

## 2011-08-13 ENCOUNTER — Emergency Department (HOSPITAL_COMMUNITY): Payer: BC Managed Care – PPO

## 2011-08-13 ENCOUNTER — Emergency Department (HOSPITAL_COMMUNITY)
Admission: EM | Admit: 2011-08-13 | Discharge: 2011-08-13 | Disposition: A | Discharge status: Home / Self Care | Payer: BC Managed Care – PPO | Attending: Emergency Medicine | Admitting: Emergency Medicine

## 2011-08-13 ENCOUNTER — Encounter (HOSPITAL_COMMUNITY): Payer: Self-pay | Admitting: *Deleted

## 2011-08-13 DIAGNOSIS — J45909 Unspecified asthma, uncomplicated: Secondary | ICD-10-CM | POA: Insufficient documentation

## 2011-08-13 DIAGNOSIS — Z91199 Patient's noncompliance with other medical treatment and regimen due to unspecified reason: Secondary | ICD-10-CM | POA: Insufficient documentation

## 2011-08-13 DIAGNOSIS — I2510 Atherosclerotic heart disease of native coronary artery without angina pectoris: Secondary | ICD-10-CM | POA: Insufficient documentation

## 2011-08-13 DIAGNOSIS — Z9119 Patient's noncompliance with other medical treatment and regimen: Secondary | ICD-10-CM | POA: Insufficient documentation

## 2011-08-13 DIAGNOSIS — I428 Other cardiomyopathies: Secondary | ICD-10-CM | POA: Insufficient documentation

## 2011-08-13 DIAGNOSIS — E119 Type 2 diabetes mellitus without complications: Secondary | ICD-10-CM | POA: Insufficient documentation

## 2011-08-13 DIAGNOSIS — Z9114 Patient's other noncompliance with medication regimen: Secondary | ICD-10-CM

## 2011-08-13 DIAGNOSIS — I10 Essential (primary) hypertension: Secondary | ICD-10-CM | POA: Insufficient documentation

## 2011-08-13 DIAGNOSIS — Z7982 Long term (current) use of aspirin: Secondary | ICD-10-CM | POA: Insufficient documentation

## 2011-08-13 DIAGNOSIS — I509 Heart failure, unspecified: Secondary | ICD-10-CM

## 2011-08-13 DIAGNOSIS — Z79899 Other long term (current) drug therapy: Secondary | ICD-10-CM | POA: Insufficient documentation

## 2011-08-13 LAB — BLOOD GAS, VENOUS
Acid-base deficit: 0.7 mmol/L (ref 0.0–2.0)
Bicarbonate: 22.7 mEq/L (ref 20.0–24.0)
Delivery systems: POSITIVE
FIO2: 0.3 %
Mode: POSITIVE
O2 Saturation: 91.3 %
PEEP: 6 cmH2O
Patient temperature: 98.6
Pressure support: 12 cmH2O
TCO2: 20.3 mmol/L (ref 0–100)
pCO2, Ven: 35.2 mmHg — ABNORMAL LOW (ref 45.0–50.0)
pH, Ven: 7.425 — ABNORMAL HIGH (ref 7.250–7.300)
pO2, Ven: 63.1 mmHg — ABNORMAL HIGH (ref 30.0–45.0)

## 2011-08-13 LAB — CBC WITH DIFFERENTIAL/PLATELET
Basophils Absolute: 0.1 10*3/uL (ref 0.0–0.1)
Basophils Relative: 1 % (ref 0–1)
Eosinophils Absolute: 0.1 10*3/uL (ref 0.0–0.7)
Eosinophils Relative: 2 % (ref 0–5)
HCT: 39.5 % (ref 39.0–52.0)
Hemoglobin: 13.7 g/dL (ref 13.0–17.0)
Lymphocytes Relative: 39 % (ref 12–46)
Lymphs Abs: 3.5 10*3/uL (ref 0.7–4.0)
MCH: 30 pg (ref 26.0–34.0)
MCHC: 34.7 g/dL (ref 30.0–36.0)
MCV: 86.4 fL (ref 78.0–100.0)
Monocytes Absolute: 0.8 10*3/uL (ref 0.1–1.0)
Monocytes Relative: 9 % (ref 3–12)
Neutro Abs: 4.4 10*3/uL (ref 1.7–7.7)
Neutrophils Relative %: 49 % (ref 43–77)
Platelets: 362 10*3/uL (ref 150–400)
RBC: 4.57 MIL/uL (ref 4.22–5.81)
RDW: 12.8 % (ref 11.5–15.5)
WBC: 8.9 10*3/uL (ref 4.0–10.5)

## 2011-08-13 LAB — BASIC METABOLIC PANEL
BUN: 14 mg/dL (ref 6–23)
CO2: 23 mEq/L (ref 19–32)
Calcium: 9 mg/dL (ref 8.4–10.5)
Chloride: 103 mEq/L (ref 96–112)
Creatinine, Ser: 1.21 mg/dL (ref 0.50–1.35)
GFR calc Af Amer: 75 mL/min — ABNORMAL LOW (ref >90)
GFR calc non Af Amer: 65 mL/min — ABNORMAL LOW (ref >90)
Glucose, Bld: 108 mg/dL — ABNORMAL HIGH (ref 70–99)
Potassium: 3.3 mEq/L — ABNORMAL LOW (ref 3.5–5.1)
Sodium: 137 mEq/L (ref 135–145)

## 2011-08-13 LAB — TROPONIN I: Troponin I: <0.3 ng/mL (ref <0.30)

## 2011-08-13 LAB — PRO B NATRIURETIC PEPTIDE: Pro B Natriuretic peptide (BNP): 989.1 pg/mL — ABNORMAL HIGH (ref 0–125)

## 2011-08-13 MED ORDER — FUROSEMIDE 10 MG/ML IJ SOLN
40.0000 mg | Freq: Once | INTRAMUSCULAR | Status: AC
  Administered 2011-08-13: 40 mg via INTRAVENOUS
  Filled 2011-08-13: qty 4

## 2011-08-13 NOTE — ED Notes (Signed)
Resp placing pt on BiPap

## 2011-08-13 NOTE — ED Notes (Signed)
Pt presents with a  C/c of shortness of breath that started 2-3 weeks ago but has gotten worse tonight, pt has CHF.  Pt denies any other complaints.

## 2011-08-13 NOTE — ED Notes (Signed)
Xray notified of stat portable chest.  

## 2011-08-13 NOTE — ED Provider Notes (Signed)
History     CSN: [id number]  Arrival date & time 08/13/11  0217   First MD Initiated Contact with Patient 08/13/11 0238      No chief complaint on file.   (Consider location/radiation/quality/duration/timing/severity/associated sxs/prior treatment) HPI Level 5 Caveat: respiratory distress; on BiPAP  This is a 56 year old black male with a history of cardiomyopathy and congestive heart failure. He has had a three-week history of shortness of breath but acutely worsened this morning. Nursing staff reports he was in obvious distress on arrival with a respiratory rate of 30. He was placed on BiPAP prior to my evaluation with improvement in his dyspnea and decrease in his respiratory rate. He has had no chest pain. He has had no nausea or vomiting.  Past Medical History  Diagnosis Date  . Asthma   . CAD     nonobst cath 2004  . CARDIOMYOPATHY     NICM, LVEF 30% 01/2011 echo  . DIABETES MELLITUS, TYPE II   . HYPERCHOLESTEROLEMIA   . HYPERTENSION   . PSA, INCREASED   . Noncompliance   . CHF (congestive heart failure)     Past Surgical History  Procedure Date  . Cardiac catheterization 2004    nonobst dz    Family History  Problem Relation Age of Onset  . Peripheral vascular disease    . Hypertension    . Prostate cancer    . Hypertension Mother   . Peripheral vascular disease Mother   . Prostate cancer Brother     History  Substance Use Topics  . Smoking status: Never Smoker   . 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 Alcohol Use: Yes     rare      Review of Systems  Unable to perform ROS   Allergies  Ambien  Home Medications   Current Outpatient Rx  Name Route Sig Dispense Refill  . ASPIRIN EC 81 MG PO TBEC Oral Take 81 mg by mouth daily.    . ATORVASTATIN CALCIUM 20 MG PO TABS Oral Take 20 mg by mouth daily.    [name] BENAZEPRIL HCL 40 MG PO TABS Oral Take 40 mg by mouth daily.    [name] CARVEDILOL  12.5 MG PO TABS Oral Take 12.5 mg by mouth 2 (two) times daily with a meal.    . FUROSEMIDE 40 MG PO TABS Oral Take 40 mg by mouth daily.    [name] GLIMEPIRIDE 2 MG PO TABS Oral Take 2 mg by mouth daily before breakfast.    . METFORMIN HCL 500 MG PO TABS Oral Take 500 mg by mouth 2 (two) times daily with a meal.    . METFORMIN HCL 500 MG PO TABS  TAKE ONE TABLET BY MOUTH TWICE DAILY WITH A MEAL 60 tablet 5  . SPIRONOLACTONE 50 MG PO TABS Oral Take 50 mg by mouth daily.      BP 180/127  Pulse 98  Temp 98.5 F (36.9 C)  Resp 26  SpO2 100%  Physical Exam General: Well-developed, well-nourished male; appearance consistent with age of record HENT: normocephalic, atraumatic Eyes: pupils equal round and reactive to light; extraocular muscles intact Neck: supple Heart: regular rate and rhythm Lungs: clear to auscultation bilaterally; on BiPAP Abdomen: soft; nondistended; nontender; bowel sounds present Extremities: No deformity; full range of motion; pulses normal; no edema Neurologic: Awake, alert; motor function intact in all extremities and symmetric; no facial droop Skin: Warm and dry  ED Course  Procedures (including critical care time)     MDM   Nursing notes and vitals signs, including pulse oximetry, reviewed.  Summary of this visit's results, reviewed by myself:  Labs:  Results for orders placed during the hospital encounter of 08/13/11  CBC WITH DIFFERENTIAL      Component Value Range   WBC 8.9  4.0 - 10.5 K/uL   RBC 4.57  4.22 - 5.81 MIL/uL   Hemoglobin 13.7  13.0 - 17.0 g/dL   HCT 82.9  56.2 - 13.0 %   MCV 86.4  78.0 - 100.0 fL   MCH 30.0  26.0 - 34.0 pg   MCHC 34.7  30.0 - 36.0 g/dL   RDW 86.5  78.4 - 69.6 %   Platelets 362  150 - 400 K/uL   Neutrophils Relative 49  43 - 77 %   Neutro Abs 4.4  1.7 - 7.7 K/uL   Lymphocytes Relative 39  12 - 46 %   Lymphs Abs 3.5  0.7 - 4.0 K/uL   Monocytes Relative 9  3 - 12 %   Monocytes Absolute 0.8  0.1 - 1.0 K/uL    Eosinophils Relative 2  0 - 5 %   Eosinophils Absolute 0.1  0.0 - 0.7 K/uL   Basophils Relative 1  0 - 1 %   Basophils Absolute 0.1  0.0 - 0.1 K/uL  BASIC METABOLIC PANEL      Component Value Range   Sodium 137  135 - 145 mEq/L   Potassium 3.3 (*) 3.5 - 5.1 mEq/L   Chloride 103  96 - 112 mEq/L   CO2 23  19 - 32 mEq/L   Glucose, Bld 108 (*) 70 - 99 mg/dL   BUN 14  6 - 23 mg/dL   Creatinine, Ser 2.95  0.50 - 1.35 mg/dL   Calcium 9.0  8.4 - 28.4 mg/dL   GFR calc non Af Amer 65 (*) >90 mL/min   GFR calc Af Amer 75 (*) >90 mL/min  BLOOD GAS, VENOUS      Component Value Range   FIO2 0.30     Delivery systems BILEVEL POSITIVE AIRWAY PRESSURE     Mode BILEVEL POSITIVE AIRWAY PRESSURE     pH, Ven 7.425 (*) 7.250 - 7.300   pCO2, Ven 35.2 (*) 45.0 - 50.0 mmHg   pO2, Ven 63.1 (*) 30.0 - 45.0 mmHg   Bicarbonate 22.7  20.0 - 24.0 mEq/L   TCO2 20.3  0 - 100 mmol/L   Acid-base deficit 0.7  0.0 - 2.0 mmol/L   O2 Saturation 91.3     Patient temperature 98.6     Collection site VEIN     Drawn by COLLECTED BY NURSE     Sample type VENOUS    TROPONIN I      Component Value Range   Troponin I <0.30  <0.30 ng/mL  PRO B NATRIURETIC PEPTIDE      Component Value Range   Pro B Natriuretic peptide (BNP) 989.1 (*) 0 - 125 pg/mL     Imaging Studies: Dg Chest Port 1 View  08/13/2011  *RADIOLOGY REPORT*  Clinical Data: Shortness of breath  PORTABLE CHEST - 1 VIEW  Comparison: 02/12/2011  Findings: Shallow inspiration.  Cardiac enlargement with increased pulmonary vascularity consistent with vascular congestion.  No significant edema or consolidation.  No blunting of costophrenic angles.  No pneumothorax.  Similar appearance to previous study.  IMPRESSION: Cardiac enlargement with pulmonary vascular congestion.  No  evidence of consolidation or edema.  Original Report Authenticated By: Marlon Pel, M.D.      EKG Interpretation:  Date & Time: 08/13/2011 2:31 AM  Rate: 110  Rhythm: sinus  tachycardia  QRS Axis: Leftward  Intervals: normal  ST/T Wave abnormalities: T wave inversions inferolaterally  Conduction Disutrbances:LVH with repolarization abnormality  Narrative Interpretation: Left atrial hypertrophy  Old EKG Reviewed: Rate is faster  6:24 AM Brisk diuresis after Lasix 40 mg IV. Patient is now resting comfortably off of BiPAP for over an hour. He denies subjective dyspnea at this time. He is having pain in percent on room air. His respiratory rate is 16. Patient admits he has been noncompliant with his Lasix and has not taken Lasix in 4 days. He was advised of the importance of taking his Lasix daily to prevent episodes such as this.       Hanley Seamen, MD 08/13/11 [phone]

## 2011-08-25 ENCOUNTER — Emergency Department (HOSPITAL_COMMUNITY)
Admission: EM | Admit: 2011-08-25 | Discharge: 2011-08-25 | Disposition: A | Discharge status: Home / Self Care | Payer: BC Managed Care – PPO | Attending: Emergency Medicine | Admitting: Emergency Medicine

## 2011-08-25 ENCOUNTER — Encounter: Payer: Self-pay | Admitting: Internal Medicine

## 2011-08-25 ENCOUNTER — Ambulatory Visit (INDEPENDENT_AMBULATORY_CARE_PROVIDER_SITE_OTHER): Payer: BC Managed Care – PPO | Admitting: Internal Medicine

## 2011-08-25 ENCOUNTER — Emergency Department (HOSPITAL_COMMUNITY): Payer: BC Managed Care – PPO

## 2011-08-25 ENCOUNTER — Encounter (HOSPITAL_COMMUNITY): Payer: Self-pay | Admitting: Emergency Medicine

## 2011-08-25 ENCOUNTER — Encounter: Payer: Self-pay | Admitting: *Deleted

## 2011-08-25 VITALS — BP 152/92 | HR 74 | Temp 97.8°F | Ht 65.0 in | Wt 185.4 lb

## 2011-08-25 DIAGNOSIS — R0602 Shortness of breath: Secondary | ICD-10-CM | POA: Insufficient documentation

## 2011-08-25 DIAGNOSIS — I10 Essential (primary) hypertension: Secondary | ICD-10-CM

## 2011-08-25 DIAGNOSIS — Z9114 Patient's other noncompliance with medication regimen: Secondary | ICD-10-CM

## 2011-08-25 DIAGNOSIS — I509 Heart failure, unspecified: Secondary | ICD-10-CM

## 2011-08-25 DIAGNOSIS — Z9119 Patient's noncompliance with other medical treatment and regimen: Secondary | ICD-10-CM

## 2011-08-25 DIAGNOSIS — E119 Type 2 diabetes mellitus without complications: Secondary | ICD-10-CM

## 2011-08-25 DIAGNOSIS — I428 Other cardiomyopathies: Secondary | ICD-10-CM

## 2011-08-25 DIAGNOSIS — Z79899 Other long term (current) drug therapy: Secondary | ICD-10-CM | POA: Insufficient documentation

## 2011-08-25 LAB — POCT I-STAT, CHEM 8
BUN: 20 mg/dL (ref 6–23)
Calcium, Ion: 1.16 mmol/L (ref 1.12–1.23)
Chloride: 108 mEq/L (ref 96–112)
Creatinine, Ser: 1.5 mg/dL — ABNORMAL HIGH (ref 0.50–1.35)
Glucose, Bld: 118 mg/dL — ABNORMAL HIGH (ref 70–99)
HCT: 38 % — ABNORMAL LOW (ref 39.0–52.0)
Hemoglobin: 12.9 g/dL — ABNORMAL LOW (ref 13.0–17.0)
Potassium: 3.6 mEq/L (ref 3.5–5.1)
Sodium: 141 mEq/L (ref 135–145)
TCO2: 21 mmol/L (ref 0–100)

## 2011-08-25 LAB — CBC
HCT: 37.4 % — ABNORMAL LOW (ref 39.0–52.0)
Hemoglobin: 12.9 g/dL — ABNORMAL LOW (ref 13.0–17.0)
MCH: 29.7 pg (ref 26.0–34.0)
MCHC: 34.5 g/dL (ref 30.0–36.0)
MCV: 86 fL (ref 78.0–100.0)
Platelets: 348 10*3/uL (ref 150–400)
RBC: 4.35 MIL/uL (ref 4.22–5.81)
RDW: 12.5 % (ref 11.5–15.5)
WBC: 7.8 10*3/uL (ref 4.0–10.5)

## 2011-08-25 MED ORDER — CARVEDILOL 12.5 MG PO TABS
12.5000 mg | ORAL_TABLET | Freq: Two times a day (BID) | ORAL | Status: DC
  Administered 2011-08-25: 12.5 mg via ORAL
  Filled 2011-08-25 (×3): qty 1

## 2011-08-25 MED ORDER — ATORVASTATIN CALCIUM 20 MG PO TABS: 20.0000 mg | ORAL_TABLET | Freq: Every day | ORAL | Status: DC

## 2011-08-25 MED ORDER — BENAZEPRIL HCL 40 MG PO TABS: 40.0000 mg | ORAL_TABLET | Freq: Every day | ORAL | Status: DC

## 2011-08-25 MED ORDER — SPIRONOLACTONE 50 MG PO TABS: 50.0000 mg | ORAL_TABLET | Freq: Every day | ORAL | Status: DC

## 2011-08-25 MED ORDER — METFORMIN HCL 500 MG PO TABS: 500.0000 mg | ORAL_TABLET | Freq: Two times a day (BID) | ORAL | Status: DC

## 2011-08-25 MED ORDER — CARVEDILOL 12.5 MG PO TABS: 12.5000 mg | ORAL_TABLET | Freq: Two times a day (BID) | ORAL | Status: DC

## 2011-08-25 MED ORDER — FUROSEMIDE 10 MG/ML IJ SOLN
40.0000 mg | Freq: Once | INTRAMUSCULAR | Status: AC
  Administered 2011-08-25: 40 mg via INTRAVENOUS
  Filled 2011-08-25: qty 4

## 2011-08-25 MED ORDER — SPIRONOLACTONE 50 MG PO TABS
50.0000 mg | ORAL_TABLET | Freq: Every day | ORAL | Status: DC
  Administered 2011-08-25: 50 mg via ORAL
  Filled 2011-08-25: qty 1

## 2011-08-25 MED ORDER — GLIMEPIRIDE 2 MG PO TABS: 2.0000 mg | ORAL_TABLET | Freq: Every day | ORAL | Status: DC

## 2011-08-25 MED ORDER — FUROSEMIDE 40 MG PO TABS: 40.0000 mg | ORAL_TABLET | Freq: Every day | ORAL | Status: DC

## 2011-08-25 NOTE — Patient Instructions (Addendum)
It was good to see you today. We have reviewed your ER records including labs and tests today Medications reviewed, no dose changes - Refill on medication(s) as discussed today.  Your prescription(s) have been submitted to your pharmacy. Please take as directed and contact our office if you believe you are having problem(s) with the medication(s). Work note provided for the week as discussed -  Please schedule followup in 3-4 months for blood pressure and diabetes mellitus check, call sooner if problems.

## 2011-08-25 NOTE — Progress Notes (Signed)
Subjective:    Patient ID: [name], male    DOB: May 20, 1955, 56 y.o.   MRN: [id number]  HPI  Here for followup - last office visit 02/2011 medical care complicated by noncompliance - frequent ER visits due to same including today Reviewed chronic medical issues today:  DM2 - remotely prescribed insulin (lantus, 70/30), changed to oral medications March 2011 - noncompliance complicated by cost of medication-  history of [facility] August 2009- denies abdominal pain, nausea and vomiting or weight changes - denies polyuria or thirst - no blurred vision - does not check cbgs -    HTN, malignant - hosp 01/2011 related to uncontrolled BP with CHF - frequent med noncompliance- denies headache, chest pain - no feet swelling or vision changes -     NICM with hx CHF - exacerbation 01/2011 hospitalization for same related to malignant HTN - follows with cards intermittently for same- no edema, chest pain, but increasing shortness of breath - reports he follows "no salt" diet very carefully but noncompliant with med rx as above  Past Medical History  Diagnosis Date  . Asthma   . CAD     nonobst cath 2004  . CARDIOMYOPATHY     NICM, LVEF 30% 01/2011 echo  . DIABETES MELLITUS, TYPE II   . HYPERCHOLESTEROLEMIA   . HYPERTENSION   . PSA, INCREASED   . Noncompliance   . CHF (congestive heart failure)      Review of Systems  Constitutional: Negative for fever and fatigue.  Eyes: Negative for visual disturbance.  Respiratory: Negative for cough and shortness of breath.   Cardiovascular: Negative for chest pain, palpitations and leg swelling.  Neurological: Negative for dizziness and headaches.       Objective:   Physical Exam  BP 152/92  Pulse 74  Temp 97.8 F (36.6 C) (Oral)  Ht 5\' 5"  (1.651 m)  Wt 185 lb 6.4 oz (84.097 kg)  BMI 30.85 kg/m2  SpO2 97% Wt Readings from Last 3 Encounters:  08/25/11 185 lb 6.4 oz (84.097 kg)  03/26/11 187 lb 9.6 oz (85.095 kg)  02/15/11 177 lb 11.1  oz (80.6 kg)   Constitutional:  He appears well-developed and well-nourished. No distress.  Neck: Normal range of motion. Neck supple. No JVD present. No thyromegaly present.  Cardiovascular: Normal rate, regular rhythm and normal heart sounds.  No murmur heard. no BLE edema Pulmonary/Chest: Effort normal and breath sounds normal. No respiratory distress. no wheezes. Skin: Skin is warm and dry.  No erythema or ulceration.  Psychiatric: he has an anxious mood and affect. behavior is normal. Judgment and thought content normal.   Lab Results  Component Value Date   WBC 7.8 08/25/2011   HGB 12.9* 08/25/2011   HCT 38.0* 08/25/2011   PLT 348 08/25/2011   GLUCOSE 118* 08/25/2011   CHOL 210* 04/25/2009   TRIG 91.0 04/25/2009   HDL 44.90 04/25/2009   LDLDIRECT 148.8 04/25/2009   LDLCALC 104 09/20/2007   ALT 52 02/14/2011   AST 22 02/14/2011   NA 141 08/25/2011   K 3.6 08/25/2011   CL 108 08/25/2011   CREATININE 1.50* 08/25/2011   BUN 20 08/25/2011   CO2 23 08/13/2011   TSH 1.773 02/13/2011   PSA 7.80* 04/25/2009   INR 1.05 05/03/2009   HGBA1C 7.6* 02/12/2011       Assessment & Plan:  See problem list. Medications and labs reviewed today.  Time spent with pt today 25 minutes, greater than 50% time spent  counseling patient on importance of compliance and work excuse for this week

## 2011-08-25 NOTE — Assessment & Plan Note (Signed)
NICM, LVEF 20% echo 1/13 IP reviewed exacerbated by uncontrolled HTN and noncompliance - reviewed same with pt today euvolemic on exam, refills on meds today ane ER visit/labs today reviewed Reminded of importance of compliance - pt agrees to commit to same and follow up with cards as planned

## 2011-08-25 NOTE — Assessment & Plan Note (Signed)
Variable medication compliance reviewed - encouraged improved efforts at compliance Lab Results  Component Value Date   HGBA1C 7.6* 02/12/2011  on ARB, encouraged statin compliance The current medical regimen is effective;  continue present plan and medications.

## 2011-08-25 NOTE — Assessment & Plan Note (Signed)
Uncontrolled, titrate beta blocker now BP Readings from Last 3 Encounters:  08/25/11 152/92  08/25/11 152/91  08/13/11 150/99   Reviewed relationship between uncontrolled blood pressure with CHF exacerbation and progressive renal disease Encourage compliance with medications

## 2011-08-25 NOTE — ED Notes (Signed)
Report received-airway intact-no s/s's of distress-will continue to monitor 

## 2011-08-25 NOTE — ED Notes (Signed)
States he is feeling better-states breathing improved-MD took O2 off-will continue to monitor

## 2011-08-25 NOTE — ED Provider Notes (Signed)
History     CSN: [id number]  Arrival date & time 08/25/11  0550   First MD Initiated Contact with Patient 08/25/11 [phone]      Chief Complaint  Patient presents with  . Shortness of Breath    (Consider location/radiation/quality/duration/timing/severity/associated sxs/prior treatment) HPI Complains of shortness of breath onset 2 weeks ago progressively worsening. Patient admits during of all medicines taking for blood pressure approximately 2 weeks ago. He admits to trying to get in to see his care physician however unable. No chest pain no fever admits to slight cough treated with Lasix has taken all doses except for this morning. Nothing makes symptoms better or worse. No other associated symptoms. Past Medical History  Diagnosis Date  . Asthma   . CAD     nonobst cath 2004  . CARDIOMYOPATHY     NICM, LVEF 30% 01/2011 echo  . DIABETES MELLITUS, TYPE II   . HYPERCHOLESTEROLEMIA   . HYPERTENSION   . PSA, INCREASED   . Noncompliance   . CHF (congestive heart failure)     Past Surgical History  Procedure Date  . Cardiac catheterization 2004    nonobst dz    Family History  Problem Relation Age of Onset  . Peripheral vascular disease    . Hypertension    . Prostate cancer    . Hypertension Mother   . Peripheral vascular disease Mother   . Prostate cancer Brother     History  Substance Use Topics  . Smoking status: Never Smoker   . 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 Alcohol Use: Yes     rare   Denies tobacco denies alcohol denies illicit drug use   Review of Systems  Respiratory: Positive for shortness of breath.   All other systems reviewed and are negative.    Allergies  Ambien  Home Medications   Current Outpatient Rx  Name Route Sig Dispense Refill  . ASPIRIN EC 81 MG PO TBEC Oral Take 81 mg by mouth daily.    . ATORVASTATIN CALCIUM 20 MG PO TABS Oral Take 20 mg by mouth daily.    [name]  BENAZEPRIL HCL 40 MG PO TABS Oral Take 40 mg by mouth daily.    [name] CARVEDILOL 12.5 MG PO TABS Oral Take 12.5 mg by mouth 2 (two) times daily with a meal.    . FUROSEMIDE 40 MG PO TABS Oral Take 40 mg by mouth daily.    [name] GLIMEPIRIDE 2 MG PO TABS Oral Take 2 mg by mouth daily before breakfast.    . METFORMIN HCL 500 MG PO TABS Oral Take 500 mg by mouth 2 (two) times daily with a meal.    . SPIRONOLACTONE 50 MG PO TABS Oral Take 50 mg by mouth daily.      BP 169/119  Pulse 91  Temp 98 F (36.7 C) (Oral)  Resp 21  SpO2 97%  Physical Exam  Nursing note and vitals reviewed. Constitutional: He appears well-developed and well-nourished.  HENT:  Head: Normocephalic and atraumatic.  Eyes: Conjunctivae are normal. Pupils are equal, round, and reactive to light.  Neck: Neck supple. No tracheal deviation present. No thyromegaly present.       Positive JVD  Cardiovascular: Normal rate and regular rhythm.   No murmur heard. Pulmonary/Chest: Effort normal and breath sounds normal.  Abdominal: Soft. Bowel sounds are normal. He exhibits no distension. There is no tenderness.  Musculoskeletal: Normal  range of motion. He exhibits no edema and no tenderness.  Neurological: He is alert. Coordination normal.  Skin: Skin is warm and dry. No rash noted.  Psychiatric: He has a normal mood and affect.    ED Course  Procedures (including critical care time)   Labs Reviewed  CBC   Date: 08/25/2011  Rate: 90  Rhythm: normal sinus rhythm  QRS Axis: normal  Intervals: normal  ST/T Wave abnormalities: Nonspecific ST-T wave changes lateral ischemic changes, no significant change from 08/13/2011 interpreted by me  Conduction Disutrbances:none  Narrative Interpretation:   Old EKG Reviewed: No significant change from 08/13/2011  No results found. Results for orders placed during the hospital encounter of 08/25/11  CBC      Component Value Range   WBC 7.8  4.0 - 10.5 K/uL   RBC 4.35  4.22 - 5.81  MIL/uL   Hemoglobin 12.9 (*) 13.0 - 17.0 g/dL   HCT 40.9 (*) 81.1 - 91.4 %   MCV 86.0  78.0 - 100.0 fL   MCH 29.7  26.0 - 34.0 pg   MCHC 34.5  30.0 - 36.0 g/dL   RDW 78.2  95.6 - 21.3 %   Platelets 348  150 - 400 K/uL  POCT I-STAT, CHEM 8      Component Value Range   Sodium 141  135 - 145 mEq/L   Potassium 3.6  3.5 - 5.1 mEq/L   Chloride 108  96 - 112 mEq/L   BUN 20  6 - 23 mg/dL   Creatinine, Ser 0.86 (*) 0.50 - 1.35 mg/dL   Glucose, Bld 578 (*) 70 - 99 mg/dL   Calcium, Ion 4.69  6.29 - 1.23 mmol/L   TCO2 21  0 - 100 mmol/L   Hemoglobin 12.9 (*) 13.0 - 17.0 g/dL   HCT 52.8 (*) 41.3 - 24.4 %   Dg Chest Port 1 View  08/25/2011  *RADIOLOGY REPORT*  Clinical Data: Shortness of breath  PORTABLE CHEST - 1 VIEW  Comparison: 08/13/2011  Findings: Pulmonary vascular congestion.  Increased interstitial opacity in the right upper lobe, possibly reflecting mild interstitial edema or infection.  No pleural effusion or pneumothorax.  Stable mild cardiomegaly.  IMPRESSION: Increased interstitial opacity in the right upper lobe, possibly reflecting mild interstitial edema or infection.  Consider PA/lateral chest radiographs for further evaluation.  Original Report Authenticated By: Charline Bills, M.D.   Dg Chest Port 1 View  08/13/2011  *RADIOLOGY REPORT*  Clinical Data: Shortness of breath  PORTABLE CHEST - 1 VIEW  Comparison: 02/12/2011  Findings: Shallow inspiration.  Cardiac enlargement with increased pulmonary vascularity consistent with vascular congestion.  No significant edema or consolidation.  No blunting of costophrenic angles.  No pneumothorax.  Similar appearance to previous study.  IMPRESSION: Cardiac enlargement with pulmonary vascular congestion.  No evidence of consolidation or edema.  Original Report Authenticated By: Marlon Pel, M.D.   Xray reviewed by me  No diagnosis found.   Patient feels breathing is at baseline after treatment with intravenous Lasix oral  Spironolactone and oral cardiveil. He's in no distress he speaks in paragraphs. Spoke with Dr. [name], an office appointment has been scheduled with her today at 1:15 PM MDM  Chest x-ray correlates clinically with CHF as patient has missed his medications and has history of CHF  Plan followup Dr.Lecsber today 1:15 PM  Diagnosis #1 CHF #2 medication noncompliance  #3 renal insufficiency  Doug Sou, MD 08/25/11 1001

## 2011-08-25 NOTE — ED Notes (Signed)
Pt alert, arrives from home, c/o SOB, onset is chronic in nature, concerned when pain radiated to right arm, resp even, mild labored, describes pain as a dull ache, denies N/V

## 2011-08-25 NOTE — ED Notes (Signed)
Placed on oxygen at 2l per [place] for comfort.

## 2011-09-18 DIAGNOSIS — Z0279 Encounter for issue of other medical certificate: Secondary | ICD-10-CM

## 2012-07-17 ENCOUNTER — Emergency Department (HOSPITAL_COMMUNITY): Payer: BC Managed Care – PPO

## 2012-07-17 ENCOUNTER — Encounter (HOSPITAL_COMMUNITY): Payer: Self-pay | Admitting: *Deleted

## 2012-07-17 ENCOUNTER — Emergency Department (HOSPITAL_COMMUNITY)
Admission: EM | Admit: 2012-07-17 | Discharge: 2012-07-17 | Disposition: A | Discharge status: Home / Self Care | Payer: BC Managed Care – PPO | Attending: Emergency Medicine | Admitting: Emergency Medicine

## 2012-07-17 DIAGNOSIS — R0989 Other specified symptoms and signs involving the circulatory and respiratory systems: Secondary | ICD-10-CM | POA: Insufficient documentation

## 2012-07-17 DIAGNOSIS — R05 Cough: Secondary | ICD-10-CM | POA: Insufficient documentation

## 2012-07-17 DIAGNOSIS — Z7982 Long term (current) use of aspirin: Secondary | ICD-10-CM | POA: Insufficient documentation

## 2012-07-17 DIAGNOSIS — Z9889 Other specified postprocedural states: Secondary | ICD-10-CM | POA: Insufficient documentation

## 2012-07-17 DIAGNOSIS — E119 Type 2 diabetes mellitus without complications: Secondary | ICD-10-CM | POA: Insufficient documentation

## 2012-07-17 DIAGNOSIS — J45901 Unspecified asthma with (acute) exacerbation: Secondary | ICD-10-CM | POA: Insufficient documentation

## 2012-07-17 DIAGNOSIS — R0600 Dyspnea, unspecified: Secondary | ICD-10-CM

## 2012-07-17 DIAGNOSIS — E7800 Pure hypercholesterolemia, unspecified: Secondary | ICD-10-CM | POA: Insufficient documentation

## 2012-07-17 DIAGNOSIS — R0609 Other forms of dyspnea: Secondary | ICD-10-CM | POA: Insufficient documentation

## 2012-07-17 DIAGNOSIS — I428 Other cardiomyopathies: Secondary | ICD-10-CM | POA: Insufficient documentation

## 2012-07-17 DIAGNOSIS — R059 Cough, unspecified: Secondary | ICD-10-CM | POA: Insufficient documentation

## 2012-07-17 DIAGNOSIS — I509 Heart failure, unspecified: Secondary | ICD-10-CM | POA: Insufficient documentation

## 2012-07-17 DIAGNOSIS — I2510 Atherosclerotic heart disease of native coronary artery without angina pectoris: Secondary | ICD-10-CM | POA: Insufficient documentation

## 2012-07-17 DIAGNOSIS — Z79899 Other long term (current) drug therapy: Secondary | ICD-10-CM | POA: Insufficient documentation

## 2012-07-17 LAB — CBC WITH DIFFERENTIAL/PLATELET
Basophils Absolute: 0.1 10*3/uL (ref 0.0–0.1)
Basophils Relative: 1 % (ref 0–1)
Eosinophils Absolute: 0.2 10*3/uL (ref 0.0–0.7)
Eosinophils Relative: 2 % (ref 0–5)
HCT: 38.7 % — ABNORMAL LOW (ref 39.0–52.0)
Hemoglobin: 13.1 g/dL (ref 13.0–17.0)
Lymphocytes Relative: 30 % (ref 12–46)
Lymphs Abs: 2.5 10*3/uL (ref 0.7–4.0)
MCH: 29 pg (ref 26.0–34.0)
MCHC: 33.9 g/dL (ref 30.0–36.0)
MCV: 85.8 fL (ref 78.0–100.0)
Monocytes Absolute: 0.8 10*3/uL (ref 0.1–1.0)
Monocytes Relative: 10 % (ref 3–12)
Neutro Abs: 4.9 10*3/uL (ref 1.7–7.7)
Neutrophils Relative %: 58 % (ref 43–77)
Platelets: 271 10*3/uL (ref 150–400)
RBC: 4.51 MIL/uL (ref 4.22–5.81)
RDW: 13 % (ref 11.5–15.5)
WBC: 8.5 10*3/uL (ref 4.0–10.5)

## 2012-07-17 LAB — BASIC METABOLIC PANEL
BUN: 14 mg/dL (ref 6–23)
CO2: 23 mEq/L (ref 19–32)
Calcium: 8.9 mg/dL (ref 8.4–10.5)
Chloride: 106 mEq/L (ref 96–112)
Creatinine, Ser: 1.17 mg/dL (ref 0.50–1.35)
GFR calc Af Amer: 78 mL/min — ABNORMAL LOW (ref >90)
GFR calc non Af Amer: 67 mL/min — ABNORMAL LOW (ref >90)
Glucose, Bld: 129 mg/dL — ABNORMAL HIGH (ref 70–99)
Potassium: 3.5 mEq/L (ref 3.5–5.1)
Sodium: 138 mEq/L (ref 135–145)

## 2012-07-17 LAB — PRO B NATRIURETIC PEPTIDE: Pro B Natriuretic peptide (BNP): 1232 pg/mL — ABNORMAL HIGH (ref 0–125)

## 2012-07-17 LAB — TROPONIN I: Troponin I: 0.3 ng/mL (ref <0.30)

## 2012-07-17 MED ORDER — AEROCHAMBER PLUS W/MASK MISC
1.0000 | Freq: Once | Status: AC
  Administered 2012-07-17: 1
  Filled 2012-07-17: qty 1

## 2012-07-17 MED ORDER — IPRATROPIUM BROMIDE 0.02 % IN SOLN
0.5000 mg | Freq: Once | RESPIRATORY_TRACT | Status: AC
  Administered 2012-07-17: 0.5 mg via RESPIRATORY_TRACT
  Filled 2012-07-17: qty 2.5

## 2012-07-17 MED ORDER — FUROSEMIDE 10 MG/ML IJ SOLN
40.0000 mg | Freq: Once | INTRAMUSCULAR | Status: AC
  Administered 2012-07-17: 40 mg via INTRAVENOUS
  Filled 2012-07-17: qty 4

## 2012-07-17 MED ORDER — SODIUM CHLORIDE 0.9 % IV SOLN
Freq: Once | INTRAVENOUS | Status: AC
  Administered 2012-07-17: 05:00:00 via INTRAVENOUS

## 2012-07-17 MED ORDER — ALBUTEROL SULFATE (5 MG/ML) 0.5% IN NEBU
5.0000 mg | INHALATION_SOLUTION | Freq: Once | RESPIRATORY_TRACT | Status: AC
  Administered 2012-07-17: 5 mg via RESPIRATORY_TRACT
  Filled 2012-07-17: qty 1

## 2012-07-17 MED ORDER — ALBUTEROL SULFATE HFA 108 (90 BASE) MCG/ACT IN AERS
2.0000 | INHALATION_SPRAY | RESPIRATORY_TRACT | Status: DC | PRN
  Administered 2012-07-17: 2 via RESPIRATORY_TRACT
  Filled 2012-07-17: qty 6.7

## 2012-07-17 NOTE — ED Notes (Signed)
RT notified of pending neb tx

## 2012-07-17 NOTE — ED Notes (Signed)
EKG old and new given to EDP, [place],MD. 

## 2012-07-17 NOTE — ED Notes (Signed)
Patient states he is supposed to be taking blood pressure medications and metformin, but has not taken them in about 2 weeks. Patient states he has been depressed but denies SI.

## 2012-07-17 NOTE — ED Provider Notes (Signed)
History     CSN: [id number]  Arrival date & time 07/17/12  0309   First MD Initiated Contact with Patient 07/17/12 0350      Chief Complaint  Patient presents with  . Shortness of Breath    (Consider location/radiation/quality/duration/timing/severity/associated sxs/prior treatment) HPI This is a 57 year old male with a history of coronary artery disease and cardiomyopathy, congestive heart failure and a childhood history of asthma. He is here with about a week history of worsening shortness of breath. The symptoms are moderate to severe when attempting to lie flat. He has not been able lie flat and this morning he could not sleep at all. He he is less able to perform activities of daily living. He denies chest pain. He has had some vaguely described pain in his right upper arm. He denies fever or chills. He has had a cough occasionally productive of clear sputum. He has not had nausea, vomiting or diarrhea.  Past Medical History  Diagnosis Date  . Asthma   . CAD     nonobst cath 2004  . CARDIOMYOPATHY     NICM, LVEF 30% 01/2011 echo  . DIABETES MELLITUS, TYPE II   . HYPERCHOLESTEROLEMIA   . HYPERTENSION   . PSA, INCREASED   . Noncompliance   . CHF (congestive heart failure)     Past Surgical History  Procedure Laterality Date  . Cardiac catheterization  2004    nonobst dz    Family History  Problem Relation Age of Onset  . Peripheral vascular disease    . Hypertension    . Prostate cancer    . Hypertension Mother   . Peripheral vascular disease Mother   . Prostate cancer Brother     History  Substance Use Topics  . Smoking status: Never Smoker   . 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 Alcohol Use: Yes     Comment: rare      Review of Systems  All other systems reviewed and are negative.    Allergies  Ambien  Home Medications   Current Outpatient Rx  Name  Route  Sig  Dispense  Refill   . aspirin EC 81 MG tablet   Oral   Take 81 mg by mouth daily.         [name] atorvastatin (LIPITOR) 20 MG tablet   Oral   Take 1 tablet (20 mg total) by mouth daily.   30 tablet   11   . benazepril (LOTENSIN) 40 MG tablet   Oral   Take 1 tablet (40 mg total) by mouth daily.   30 tablet   11   . carvedilol (COREG) 12.5 MG tablet   Oral   Take 1 tablet (12.5 mg total) by mouth 2 (two) times daily with a meal.   60 tablet   11   . furosemide (LASIX) 40 MG tablet   Oral   Take 1 tablet (40 mg total) by mouth daily.   30 tablet   11   . glimepiride (AMARYL) 2 MG tablet   Oral   Take 1 tablet (2 mg total) by mouth daily before breakfast.   30 tablet   11   . metFORMIN (GLUCOPHAGE) 500 MG tablet   Oral   Take 1 tablet (500 mg total) by mouth 2 (two) times daily with a meal.   60 tablet   11   . spironolactone (ALDACTONE) 50 MG  tablet   Oral   Take 1 tablet (50 mg total) by mouth daily.   30 tablet   11     BP 179/120  Pulse 44  Temp(Src) 98.3 F (36.8 C) (Oral)  Resp 11  SpO2 100%  Physical Exam General: Well-developed, well-nourished male in no acute distress; appearance consistent with age of record HENT: normocephalic, atraumatic Eyes: pupils equal round and reactive to light; extraocular muscles intact Neck: supple Heart: regular rate and rhythm Lungs: Decreased air movement bilaterally; tachypnea Abdomen: soft; nondistended; nontender; no masses or hepatosplenomegaly; bowel sounds present Extremities: No deformity; full range of motion; pulses normal Neurologic: Awake, alert and oriented; motor function intact in all extremities and symmetric; no facial droop Skin: Warm and dry Psychiatric: Normal mood and affect    ED Course  Procedures (including critical care time)     MDM   Nursing notes and vitals signs, including pulse oximetry, reviewed.  Summary of this visit's results, reviewed by myself:  Labs:  Results for orders placed during  the hospital encounter of 07/17/12 (from the past 24 hour(s))  CBC WITH DIFFERENTIAL     Status: Abnormal   Collection Time    07/17/12  4:35 AM      Result Value Range   WBC 8.5  4.0 - 10.5 K/uL   RBC 4.51  4.22 - 5.81 MIL/uL   Hemoglobin 13.1  13.0 - 17.0 g/dL   HCT 16.1 (*) 09.6 - 04.5 %   MCV 85.8  78.0 - 100.0 fL   MCH 29.0  26.0 - 34.0 pg   MCHC 33.9  30.0 - 36.0 g/dL   RDW 40.9  81.1 - 91.4 %   Platelets 271  150 - 400 K/uL   Neutrophils Relative % 58  43 - 77 %   Neutro Abs 4.9  1.7 - 7.7 K/uL   Lymphocytes Relative 30  12 - 46 %   Lymphs Abs 2.5  0.7 - 4.0 K/uL   Monocytes Relative 10  3 - 12 %   Monocytes Absolute 0.8  0.1 - 1.0 K/uL   Eosinophils Relative 2  0 - 5 %   Eosinophils Absolute 0.2  0.0 - 0.7 K/uL   Basophils Relative 1  0 - 1 %   Basophils Absolute 0.1  0.0 - 0.1 K/uL  BASIC METABOLIC PANEL     Status: Abnormal   Collection Time    07/17/12  4:35 AM      Result Value Range   Sodium 138  135 - 145 mEq/L   Potassium 3.5  3.5 - 5.1 mEq/L   Chloride 106  96 - 112 mEq/L   CO2 23  19 - 32 mEq/L   Glucose, Bld 129 (*) 70 - 99 mg/dL   BUN 14  6 - 23 mg/dL   Creatinine, Ser 7.82  0.50 - 1.35 mg/dL   Calcium 8.9  8.4 - 95.6 mg/dL   GFR calc non Af Amer 67 (*) >90 mL/min   GFR calc Af Amer 78 (*) >90 mL/min  TROPONIN I     Status: Abnormal   Collection Time    07/17/12  4:35 AM      Result Value Range   Troponin I 0.30 (*) <0.30 ng/mL  PRO B NATRIURETIC PEPTIDE     Status: Abnormal   Collection Time    07/17/12  4:35 AM      Result Value Range   Pro B Natriuretic peptide (BNP) 1232.0 (*) 0 -  125 pg/mL    Imaging Studies: Dg Chest 2 View  07/17/2012   *RADIOLOGY REPORT*  Clinical Data: Shortness of breath, history asthma, CHF, coronary artery disease, diabetes, hypertension  CHEST - 2 VIEW  Comparison: 08/25/2011  Findings: Enlargement of cardiac silhouette with pulmonary vascular congestion. Tortuous aorta. Lungs clear. No gross failure or acute  consolidation identified. No pleural effusion or pneumothorax. Bones unremarkable.  IMPRESSION: Enlargement of cardiac silhouette with pulmonary vascular congestion. No definite acute infiltrate.   Original Report Authenticated By: Ulyses Southward, M.D.    EKG Interpretation:  Date & Time: 07/17/2012 3:30 AM  Rate: 94  Rhythm: normal sinus rhythm  QRS Axis: normal  Intervals: normal  ST/T Wave abnormalities: lateral T-Wave inversions  Conduction Disutrbances: LBBB  Narrative Interpretation: LVH; LAH  Old EKG Reviewed: unchanged  6:13 AM Improved air movement after albuterol Atrovent neb treatment. Brisk diuresis after IV Lasix.  6:24 AM Patient ambulated without dyspnea without desaturation. The patient's dyspnea is likely a combination of his chronic CHF as well as bronchospasm. We will provide him with an albuterol inhaler he will follow up with his primary care physician. His borderline high troponin is likely due to his chronic CHF.     Hanley Seamen, MD 07/17/12 [phone]

## 2012-07-17 NOTE — ED Notes (Signed)
Patient c/o increased shortness of breath x2 days. Patient also c/o R upper arm pain started the same time.

## 2012-07-17 NOTE — ED Notes (Signed)
Patient placed on 2L [place] for comfort.

## 2012-07-17 NOTE — ED Notes (Addendum)
Pt. Ambulated down the hall and back to his room without difficulty. Pt. Ambulated without assistance. Pt. Gait steady on his feet. Pt. Oxygen level 99% on room air.

## 2012-07-17 NOTE — ED Notes (Signed)
Pt is sob rr 28  O2 100%  Hr 103,  History of CHF

## 2012-07-30 ENCOUNTER — Emergency Department (HOSPITAL_COMMUNITY): Payer: BC Managed Care – PPO

## 2012-07-30 ENCOUNTER — Encounter (HOSPITAL_COMMUNITY): Payer: Self-pay | Admitting: *Deleted

## 2012-07-30 ENCOUNTER — Inpatient Hospital Stay (HOSPITAL_COMMUNITY)
Admission: EM | Admit: 2012-07-30 | Discharge: 2012-08-02 | DRG: 544 | Disposition: A | Discharge status: Home Health | Payer: BC Managed Care – PPO | Attending: Internal Medicine | Admitting: Internal Medicine

## 2012-07-30 DIAGNOSIS — I509 Heart failure, unspecified: Secondary | ICD-10-CM

## 2012-07-30 DIAGNOSIS — E1165 Type 2 diabetes mellitus with hyperglycemia: Secondary | ICD-10-CM | POA: Diagnosis present

## 2012-07-30 DIAGNOSIS — E7800 Pure hypercholesterolemia, unspecified: Secondary | ICD-10-CM | POA: Diagnosis present

## 2012-07-30 DIAGNOSIS — Z9119 Patient's noncompliance with other medical treatment and regimen: Secondary | ICD-10-CM

## 2012-07-30 DIAGNOSIS — E785 Hyperlipidemia, unspecified: Secondary | ICD-10-CM | POA: Diagnosis present

## 2012-07-30 DIAGNOSIS — E119 Type 2 diabetes mellitus without complications: Secondary | ICD-10-CM | POA: Diagnosis present

## 2012-07-30 DIAGNOSIS — N179 Acute kidney failure, unspecified: Secondary | ICD-10-CM | POA: Diagnosis present

## 2012-07-30 DIAGNOSIS — N182 Chronic kidney disease, stage 2 (mild): Secondary | ICD-10-CM | POA: Diagnosis present

## 2012-07-30 DIAGNOSIS — I5023 Acute on chronic systolic (congestive) heart failure: Principal | ICD-10-CM | POA: Diagnosis present

## 2012-07-30 DIAGNOSIS — Z79899 Other long term (current) drug therapy: Secondary | ICD-10-CM

## 2012-07-30 DIAGNOSIS — Z91199 Patient's noncompliance with other medical treatment and regimen due to unspecified reason: Secondary | ICD-10-CM

## 2012-07-30 DIAGNOSIS — I129 Hypertensive chronic kidney disease with stage 1 through stage 4 chronic kidney disease, or unspecified chronic kidney disease: Secondary | ICD-10-CM | POA: Diagnosis present

## 2012-07-30 DIAGNOSIS — I5021 Acute systolic (congestive) heart failure: Secondary | ICD-10-CM

## 2012-07-30 DIAGNOSIS — Z9114 Patient's other noncompliance with medication regimen: Secondary | ICD-10-CM

## 2012-07-30 DIAGNOSIS — I10 Essential (primary) hypertension: Secondary | ICD-10-CM

## 2012-07-30 LAB — TROPONIN I
Troponin I: <0.3 ng/mL (ref <0.30)
Troponin I: <0.3 ng/mL (ref <0.30)
Troponin I: <0.3 ng/mL (ref <0.30)

## 2012-07-30 LAB — CBC
HCT: 35.7 % — ABNORMAL LOW (ref 39.0–52.0)
HCT: 37.9 % — ABNORMAL LOW (ref 39.0–52.0)
Hemoglobin: 11.9 g/dL — ABNORMAL LOW (ref 13.0–17.0)
Hemoglobin: 12.8 g/dL — ABNORMAL LOW (ref 13.0–17.0)
MCH: 29.5 pg (ref 26.0–34.0)
MCH: 29.7 pg (ref 26.0–34.0)
MCHC: 33.3 g/dL (ref 30.0–36.0)
MCHC: 33.8 g/dL (ref 30.0–36.0)
MCV: 88.4 fL (ref 78.0–100.0)
MCV: 87.9 fL (ref 78.0–100.0)
Platelets: 281 10*3/uL (ref 150–400)
Platelets: 332 10*3/uL (ref 150–400)
RBC: 4.04 MIL/uL — ABNORMAL LOW (ref 4.22–5.81)
RBC: 4.31 MIL/uL (ref 4.22–5.81)
RDW: 13.1 % (ref 11.5–15.5)
RDW: 13.1 % (ref 11.5–15.5)
WBC: 8.6 10*3/uL (ref 4.0–10.5)
WBC: 7.7 10*3/uL (ref 4.0–10.5)

## 2012-07-30 LAB — BASIC METABOLIC PANEL
BUN: 22 mg/dL (ref 6–23)
CO2: 21 mEq/L (ref 19–32)
Calcium: 9 mg/dL (ref 8.4–10.5)
Chloride: 106 mEq/L (ref 96–112)
Creatinine, Ser: 1.43 mg/dL — ABNORMAL HIGH (ref 0.50–1.35)
GFR calc Af Amer: 61 mL/min — ABNORMAL LOW (ref >90)
GFR calc non Af Amer: 53 mL/min — ABNORMAL LOW (ref >90)
Glucose, Bld: 127 mg/dL — ABNORMAL HIGH (ref 70–99)
Potassium: 3.7 mEq/L (ref 3.5–5.1)
Sodium: 138 mEq/L (ref 135–145)

## 2012-07-30 LAB — POCT I-STAT TROPONIN I: Troponin i, poc: 0.08 ng/mL (ref 0.00–0.08)

## 2012-07-30 LAB — CREATININE, SERUM
Creatinine, Ser: 1.48 mg/dL — ABNORMAL HIGH (ref 0.50–1.35)
GFR calc Af Amer: 58 mL/min — ABNORMAL LOW (ref >90)
GFR calc non Af Amer: 50 mL/min — ABNORMAL LOW (ref >90)

## 2012-07-30 LAB — HEMOGLOBIN A1C
Hgb A1c MFr Bld: 6.2 % — ABNORMAL HIGH (ref <5.7)
Mean Plasma Glucose: 131 mg/dL — ABNORMAL HIGH (ref <117)

## 2012-07-30 LAB — GLUCOSE, CAPILLARY
Glucose-Capillary: 134 mg/dL — ABNORMAL HIGH (ref 70–99)
Glucose-Capillary: 81 mg/dL (ref 70–99)
Glucose-Capillary: 99 mg/dL (ref 70–99)

## 2012-07-30 LAB — PRO B NATRIURETIC PEPTIDE: Pro B Natriuretic peptide (BNP): 542.8 pg/mL — ABNORMAL HIGH (ref 0–125)

## 2012-07-30 LAB — TSH: TSH: 0.928 u[IU]/mL (ref 0.350–4.500)

## 2012-07-30 MED ORDER — FUROSEMIDE 10 MG/ML IJ SOLN
INTRAMUSCULAR | Status: AC
  Administered 2012-07-30: 60 mg via INTRAVENOUS
  Filled 2012-07-30: qty 8

## 2012-07-30 MED ORDER — ATORVASTATIN CALCIUM 20 MG PO TABS
20.0000 mg | ORAL_TABLET | Freq: Every day | ORAL | Status: DC
  Administered 2012-07-30 – 2012-08-01 (×3): 20 mg via ORAL
  Filled 2012-07-30 (×4): qty 1

## 2012-07-30 MED ORDER — ASPIRIN EC 81 MG PO TBEC
81.0000 mg | DELAYED_RELEASE_TABLET | Freq: Every day | ORAL | Status: DC
  Administered 2012-07-31 – 2012-08-02 (×3): 81 mg via ORAL
  Filled 2012-07-30 (×3): qty 1

## 2012-07-30 MED ORDER — FUROSEMIDE 10 MG/ML IJ SOLN
60.0000 mg | Freq: Two times a day (BID) | INTRAMUSCULAR | Status: DC
  Administered 2012-07-31: 60 mg via INTRAVENOUS
  Filled 2012-07-30 (×3): qty 6

## 2012-07-30 MED ORDER — ONDANSETRON HCL 4 MG/2ML IJ SOLN: 4.0000 mg | Freq: Four times a day (QID) | INTRAMUSCULAR | Status: DC | PRN

## 2012-07-30 MED ORDER — SODIUM CHLORIDE 0.9 % IV SOLN: 250.0000 mL | INTRAVENOUS | Status: DC | PRN

## 2012-07-30 MED ORDER — SODIUM CHLORIDE 0.9 % IJ SOLN
3.0000 mL | Freq: Two times a day (BID) | INTRAMUSCULAR | Status: DC
  Administered 2012-07-30 – 2012-08-02 (×6): 3 mL via INTRAVENOUS

## 2012-07-30 MED ORDER — ASPIRIN 325 MG PO TABS
325.0000 mg | ORAL_TABLET | Freq: Once | ORAL | Status: AC
  Administered 2012-07-30: 325 mg via ORAL
  Filled 2012-07-30: qty 1

## 2012-07-30 MED ORDER — NITROGLYCERIN 0.4 MG SL SUBL
0.4000 mg | SUBLINGUAL_TABLET | Freq: Once | SUBLINGUAL | Status: AC
  Administered 2012-07-30: 0.4 mg via SUBLINGUAL

## 2012-07-30 MED ORDER — INSULIN ASPART 100 UNIT/ML ~~LOC~~ SOLN: 0.0000 [IU] | Freq: Every day | SUBCUTANEOUS | Status: DC

## 2012-07-30 MED ORDER — NITROGLYCERIN 2 % TD OINT
0.5000 [in_us] | TOPICAL_OINTMENT | Freq: Four times a day (QID) | TRANSDERMAL | Status: DC
  Administered 2012-07-30 – 2012-07-31 (×4): 0.5 [in_us] via TOPICAL
  Filled 2012-07-30: qty 30

## 2012-07-30 MED ORDER — FUROSEMIDE 10 MG/ML IJ SOLN
40.0000 mg | Freq: Once | INTRAMUSCULAR | Status: AC
  Administered 2012-07-30: 40 mg via INTRAVENOUS
  Filled 2012-07-30: qty 4

## 2012-07-30 MED ORDER — INSULIN ASPART 100 UNIT/ML ~~LOC~~ SOLN
0.0000 [IU] | Freq: Three times a day (TID) | SUBCUTANEOUS | Status: DC
  Administered 2012-07-30: 2 [IU] via SUBCUTANEOUS

## 2012-07-30 MED ORDER — HEPARIN SODIUM (PORCINE) 5000 UNIT/ML IJ SOLN
5000.0000 [IU] | Freq: Three times a day (TID) | INTRAMUSCULAR | Status: DC
  Administered 2012-07-30 – 2012-08-02 (×9): 5000 [IU] via SUBCUTANEOUS
  Filled 2012-07-30 (×12): qty 1

## 2012-07-30 MED ORDER — ACETAMINOPHEN 325 MG PO TABS
650.0000 mg | ORAL_TABLET | ORAL | Status: DC | PRN
  Administered 2012-07-30 – 2012-08-01 (×6): 650 mg via ORAL
  Filled 2012-07-30 (×6): qty 2

## 2012-07-30 MED ORDER — SODIUM CHLORIDE 0.9 % IJ SOLN: 3.0000 mL | INTRAMUSCULAR | Status: DC | PRN

## 2012-07-30 MED ORDER — CARVEDILOL 12.5 MG PO TABS
12.5000 mg | ORAL_TABLET | Freq: Two times a day (BID) | ORAL | Status: DC
  Administered 2012-07-30 – 2012-08-02 (×6): 12.5 mg via ORAL
  Filled 2012-07-30 (×8): qty 1

## 2012-07-30 NOTE — ED Notes (Signed)
Walked patient in the hallway. He reports that he was better with his SOB when standing still, but very dyspneic with walking. O2 sats remains good - 95 and above. However his WOB increased

## 2012-07-30 NOTE — Progress Notes (Signed)
Patient declines MyChart. He does not have a computer

## 2012-07-30 NOTE — ED Provider Notes (Signed)
History    CSN: [id number] Arrival date & time 07/30/12  0135  First MD Initiated Contact with Patient 07/30/12 0303     Chief Complaint  Patient presents with  . Shortness of Breath    Patient is a 57 y.o. male presenting with shortness of breath. The history is provided by the patient.  Shortness of Breath Severity:  Moderate Onset quality:  Gradual Duration: over a week ago. Timing:  Constant Progression:  Worsening Chronicity:  Recurrent Relieved by:  Rest Worsened by:  Activity (supine position) Associated symptoms: no chest pain, no cough, no fever, no syncope and no vomiting    Past Medical History  Diagnosis Date  . Asthma   . CAD     nonobst cath 2004  . CARDIOMYOPATHY     NICM, LVEF 30% 01/2011 echo  . DIABETES MELLITUS, TYPE II   . HYPERCHOLESTEROLEMIA   . HYPERTENSION   . PSA, INCREASED   . Noncompliance   . CHF (congestive heart failure)    Past Surgical History  Procedure Laterality Date  . Cardiac catheterization  2004    nonobst dz   Family History  Problem Relation Age of Onset  . Peripheral vascular disease    . Hypertension    . Prostate cancer    . Hypertension Mother   . Peripheral vascular disease Mother   . Prostate cancer Brother    History  Substance Use Topics  . Smoking status: Never Smoker   . 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 Alcohol Use: Yes     Comment: rare    Review of Systems  Constitutional: Negative for fever.  Respiratory: Positive for shortness of breath. Negative for cough.   Cardiovascular: Negative for chest pain, leg swelling and syncope.  Gastrointestinal: Negative for vomiting.  Neurological: Negative for syncope.  All other systems reviewed and are negative.    Allergies  Ambien  Home Medications   Current Outpatient Rx  Name  Route  Sig  Dispense  Refill  . aspirin EC 81 MG tablet   Oral   Take 81 mg by mouth daily.          [name] atorvastatin (LIPITOR) 20 MG tablet   Oral   Take 1 tablet (20 mg total) by mouth daily.   30 tablet   11   . benazepril (LOTENSIN) 40 MG tablet   Oral   Take 1 tablet (40 mg total) by mouth daily.   30 tablet   11   . carvedilol (COREG) 12.5 MG tablet   Oral   Take 1 tablet (12.5 mg total) by mouth 2 (two) times daily with a meal.   60 tablet   11   . furosemide (LASIX) 40 MG tablet   Oral   Take 1 tablet (40 mg total) by mouth daily.   30 tablet   11   . glimepiride (AMARYL) 2 MG tablet   Oral   Take 1 tablet (2 mg total) by mouth daily before breakfast.   30 tablet   11   . metFORMIN (GLUCOPHAGE) 500 MG tablet   Oral   Take 1 tablet (500 mg total) by mouth 2 (two) times daily with a meal.   60 tablet   11   . spironolactone (ALDACTONE) 50 MG tablet   Oral   Take 1 tablet (50 mg total) by mouth daily.   30 tablet  11    BP 146/89  Pulse 88  Temp(Src) 98.9 F (37.2 C) (Oral)  Resp 26  SpO2 100% Physical Exam CONSTITUTIONAL: Well developed/well nourished HEAD: Normocephalic/atraumatic EYES: EOMI/PERRL ENMT: Mucous membranes moist NECK: supple no meningeal signs SPINE:entire spine nontender CV: S1/S2 noted LUNGS: decreased BS noted bilaterally.  No distress ABDOMEN: soft, nontender, no rebound or guarding GU:no cva tenderness NEURO: Pt is awake/alert, moves all extremitiesx4 EXTREMITIES: pulses normal, full ROM, no LE edema noted SKIN: warm, color normal PSYCH: no abnormalities of mood noted  ED Course  Procedures (including critical care time) Labs Reviewed  BASIC METABOLIC PANEL - Abnormal; Notable for the following:    Glucose, Bld 127 (*)    Creatinine, Ser 1.43 (*)    GFR calc non Af Amer 53 (*)    GFR calc Af Amer 61 (*)    All other components within normal limits  CBC - Abnormal; Notable for the following:    RBC 4.04 (*)    Hemoglobin 11.9 (*)    HCT 35.7 (*)    All other components within normal limits  PRO B NATRIURETIC  PEPTIDE  POCT I-STAT TROPONIN I   Dg Chest 2 View (if Patient Has Fever And/or Copd)  07/30/2012   *RADIOLOGY REPORT*  Clinical Data: Short of breath.  CHEST - 2 VIEW  Comparison: 07/17/2012.  Findings: Cardiomegaly and pulmonary vascular congestion.  No airspace disease.  Scattered areas of subsegmental atelectasis and / or scarring. Monitoring leads are projected over the chest. No focal consolidation.  No effusion.  Thickening of the fissures is present on the lateral view, compatible with early interstitial edema.  IMPRESSION: Cardiomegaly and pulmonary vascular congestion, similar to the most recent prior examination. Interstitial edema with thickening of the fissures.   Original Report Authenticated By: Andreas Newport, M.D.   4:10 AM Pt continuation of SOB since recent ED visit He reports orthopnea He has known h/o CHF He denies CP and reports similar to prior presentations Labs pending, will treat with NTG and lasix 6:28 AM Pt with worsening SOB After ambulation here he is tachypneic  He reports this has been ongoing for weeks and seems to be worse and he is failing outpatient therapy Will admit for acute on chronic CHF exacerbation Patient agreeable with plan D/w dr Izola Price to admit  MDM  Nursing notes including past medical history and social history reviewed and considered in documentation xrays reviewed and considered Labs/vital reviewed and considered Previous records reviewed and considered - recent ED visit reviewed, h/o CHF     Date: 07/30/2012  Rate: 72  Rhythm: normal sinus rhythm  QRS Axis: left  Intervals: normal  ST/T Wave abnormalities: nonspecific ST changes  Conduction Disutrbances:nonspecific intraventricular conduction delay  Narrative Interpretation: LVH noted  Old EKG Reviewed: unchanged from 07/17/12       Joya Gaskins, MD 07/30/12 0630

## 2012-07-30 NOTE — ED Notes (Addendum)
EDP Wickline given I-Stat trop results.

## 2012-07-30 NOTE — ED Notes (Signed)
Bed:WA04<BR> Expected date:<BR> Expected time:<BR> Means of arrival:<BR> Comments:<BR>

## 2012-07-30 NOTE — H&P (Signed)
[facility] History and Physical  [name] ZOX:[id number] DOB: 1955-10-15 DOA: 07/30/2012   PCP: Rene Paci, MD  Specialists: He used to be followed by Dr. [place] Lions, however, he hasn't seen him in many years  Chief Complaint: Shortness of breath since the end of June  HPI: [name] is a 57 y.o. male with a past medical history of systolic congestive heart failure, diabetes, hypertension, who is unfortunately, noncompliant with his medication. He had been not taking any of his medicines for a month and then restarted just this past Tuesday. He presented to the emergency department at end of June with complaints of shortness of breath. He was given Nebulizer treatment and Lasix. He felt a little better and he was discharged home. However, his breathing has not improved significantly. He has noted cough with clear expectoration. He denies any leg swelling. Denies any chest pain. No fever or chills. No sick contacts. No recent travel. Denies any nausea, and vomiting. Since his breathing was not improving, he decided to present back to the hospital. Denies any weight gain. Denies any dietary indiscretion. He is aware that he needs to be more compliant with his medications. Denies any palpitations. Denies any dizziness.  Home Medications: Prior to Admission medications   Medication Sig Start Date End Date Taking? Authorizing Provider  aspirin EC 81 MG tablet Take 81 mg by mouth daily.   Yes Historical Provider, MD  atorvastatin (LIPITOR) 20 MG tablet Take 1 tablet (20 mg total) by mouth daily. 08/25/11  Yes Newt Lukes, MD  benazepril (LOTENSIN) 40 MG tablet Take 1 tablet (40 mg total) by mouth daily. 08/25/11  Yes Newt Lukes, MD  carvedilol (COREG) 12.5 MG tablet Take 1 tablet (12.5 mg total) by mouth 2 (two) times daily with a meal. 08/25/11  Yes Newt Lukes, MD  furosemide (LASIX) 40 MG tablet Take 1 tablet (40 mg total) by mouth daily. 08/25/11  Yes Newt Lukes, MD  glimepiride (AMARYL) 2 MG tablet Take 1 tablet (2 mg total) by mouth daily before breakfast. 08/25/11  Yes Newt Lukes, MD  metFORMIN (GLUCOPHAGE) 500 MG tablet Take 1 tablet (500 mg total) by mouth 2 (two) times daily with a meal. 08/25/11  Yes Newt Lukes, MD  spironolactone (ALDACTONE) 50 MG tablet Take 1 tablet (50 mg total) by mouth daily. 08/25/11  Yes Newt Lukes, MD    Allergies:  Allergies  Allergen Reactions  . Ambien (Zolpidem Tartrate) Other (See Comments)    "Went berserk."    Past Medical History: Past Medical History  Diagnosis Date  . Asthma   . CAD     nonobst cath 2004  . CARDIOMYOPATHY     NICM, LVEF 30% 01/2011 echo  . DIABETES MELLITUS, TYPE II   . HYPERCHOLESTEROLEMIA   . HYPERTENSION   . PSA, INCREASED   . Noncompliance   . CHF (congestive heart failure)     Past Surgical History  Procedure Laterality Date  . Cardiac catheterization  2004    nonobst dz  . Surgery scrotal / testicular      at age 50    Social History:  reports that he has never smoked. He has never used smokeless tobacco. He reports that  drinks alcohol. He reports that he does not use illicit drugs.  Living Situation: Lives in [place] with his daughter. Activity Level: Usually independent with daily activities   Family History:  Family History  Problem Relation Age of Onset  .  Peripheral vascular disease    . Hypertension    . Prostate cancer    . Hypertension Mother   . Peripheral vascular disease Mother   . Prostate cancer Brother      Review of Systems - History obtained from the patient General ROS: positive for  - fatigue Psychological ROS: negative Ophthalmic ROS: negative ENT ROS: negative Allergy and Immunology ROS: negative Hematological and Lymphatic ROS: negative Endocrine ROS: negative Respiratory ROS: as in hpi Cardiovascular ROS: as in hpi Gastrointestinal ROS: no abdominal pain, change in bowel habits, or black or  bloody stools Genito-Urinary ROS: no dysuria, trouble voiding, or hematuria Musculoskeletal ROS: negative Neurological ROS: no TIA or stroke symptoms Dermatological ROS: negative  Physical Examination  Filed Vitals:   07/30/12 0139 07/30/12 0332 07/30/12 0746 07/30/12 0844  BP: 146/89 131/89 128/88   Pulse: 88 73    Temp: 98.9 F (37.2 C) 98.1 F (36.7 C) 98.9 F (37.2 C)   TempSrc: Oral Oral Oral   Resp: 26 18 25    Height:    5\' 2"  (1.575 m)  Weight:    81.5 kg (179 lb 10.8 oz)  SpO2: 100% 100% 100%     General appearance: alert, cooperative, appears stated age and no distress Head: Normocephalic, without obvious abnormality, atraumatic Eyes: conjunctivae/corneas clear. PERRL, EOM's intact.  Throat: lips, mucosa, and tongue normal; teeth and gums normal Neck: no adenopathy, no carotid bruit, no JVD, supple, symmetrical, trachea midline and thyroid not enlarged, symmetric, no tenderness/mass/nodules Back: symmetric, no curvature. ROM normal. No CVA tenderness. Resp: Crackles at the bases. no wheezing Cardio: regular rate and rhythm, S1, S2 normal, no murmur, click, rub or gallop GI: soft, non-tender; bowel sounds normal; no masses,  no organomegaly Extremities: extremities normal, atraumatic, no cyanosis or edema Pulses: 2+ and symmetric Skin: Skin color, texture, turgor normal. No rashes or lesions Lymph nodes: Cervical, supraclavicular, and axillary nodes normal. Neurologic: Alert and oriented x3. No focal neurological deficits  Laboratory Data: Results for orders placed during the hospital encounter of 07/30/12 (from the past 48 hour(s))  BASIC METABOLIC PANEL     Status: Abnormal   Collection Time    07/30/12  2:47 AM      Result Value Range   Sodium 138  135 - 145 mEq/L   Potassium 3.7  3.5 - 5.1 mEq/L   Chloride 106  96 - 112 mEq/L   CO2 21  19 - 32 mEq/L   Glucose, Bld 127 (*) 70 - 99 mg/dL   BUN 22  6 - 23 mg/dL   Creatinine, Ser 1.61 (*) 0.50 - 1.35 mg/dL    Calcium 9.0  8.4 - 09.6 mg/dL   GFR calc non Af Amer 53 (*) >90 mL/min   GFR calc Af Amer 61 (*) >90 mL/min   Comment:            The eGFR has been calculated     using the CKD EPI equation.     This calculation has not been     validated in all clinical     situations.     eGFR's persistently     <90 mL/min signify     possible Chronic Kidney Disease.  CBC     Status: Abnormal   Collection Time    07/30/12  2:47 AM      Result Value Range   WBC 8.6  4.0 - 10.5 K/uL   RBC 4.04 (*) 4.22 - 5.81 MIL/uL   Hemoglobin  11.9 (*) 13.0 - 17.0 g/dL   HCT 65.7 (*) 84.6 - 96.2 %   MCV 88.4  78.0 - 100.0 fL   MCH 29.5  26.0 - 34.0 pg   MCHC 33.3  30.0 - 36.0 g/dL   RDW 95.2  84.1 - 32.4 %   Platelets 281  150 - 400 K/uL  POCT I-STAT TROPONIN I     Status: None   Collection Time    07/30/12  2:51 AM      Result Value Range   Troponin i, poc 0.08  0.00 - 0.08 ng/mL   Comment NOTIFIED PHYSICIAN     Comment 3            Comment: Due to the release kinetics of cTnI,     a negative result within the first hours     of the onset of symptoms does not rule out     myocardial infarction with certainty.     If myocardial infarction is still suspected,     repeat the test at appropriate intervals.  PRO B NATRIURETIC PEPTIDE     Status: Abnormal   Collection Time    07/30/12  4:25 AM      Result Value Range   Pro B Natriuretic peptide (BNP) 542.8 (*) 0 - 125 pg/mL    Radiology Reports: Dg Chest 2 View (if Patient Has Fever And/or Copd)  07/30/2012   *RADIOLOGY REPORT*  Clinical Data: Short of breath.  CHEST - 2 VIEW  Comparison: 07/17/2012.  Findings: Cardiomegaly and pulmonary vascular congestion.  No airspace disease.  Scattered areas of subsegmental atelectasis and / or scarring. Monitoring leads are projected over the chest. No focal consolidation.  No effusion.  Thickening of the fissures is present on the lateral view, compatible with early interstitial edema.  IMPRESSION: Cardiomegaly and  pulmonary vascular congestion, similar to the most recent prior examination. Interstitial edema with thickening of the fissures.   Original Report Authenticated By: Andreas Newport, M.D.    Electrocardiogram: EKG shows sinus rhythm at 72 beats a minute. Left axis deviation is noted. No Q waves. Intraventricular conduction delays noted. LVH pattern is noted. There is T inversion in V5 and V6, which appears to be old. No acute ST changes are noted.  Problem List  Principal Problem:   Acute systolic congestive heart failure Active Problems:   DIABETES MELLITUS, TYPE II   HYPERCHOLESTEROLEMIA   Non compliance w medication regimen   HTN (hypertension), benign   Assessment: This is a 57 year old, African American male who presents with worsening dyspnea, which is present at rest and gets worse with exertion. CXR shows interstitial edema. Most likely this is acute exacerbation of his systolic heart failure. He is unfortunately, noncompliant with his medications which is contributing to his presentation.  Plan: #1 acute systolic CHF: Continue with intravenous Lasix. Strict ins and outs. Daily weights were measured. Last echocardiogram was from January of 2013, which showed EF of 40%. We will repeat another echocardiogram this admission. We will hold his spironolactone and his ARB due to elevation in creatinine. Utilize nitro paste.  #2 diabetes mellitus type 2: Check HbA1c. Place him on sliding scale. Hold his oral hypoglycemic agents for now.  #3 history of hypertension: Continue to monitor blood pressures closely.  #4 slightly elevated Creatinine: Continue to trend for now. He is making urine.  DVT Prophylaxis: Heparin Code Status: Full code Family Communication: Discussed with the patient in detail  Disposition Plan: Admit to telemetry  Further management decisions will depend on results of further testing and patient's response to treatment.  [facility]  [facility] Pager  [phone]  If 7PM-7AM, please contact night-coverage at [email or web address] Password [facility]  07/30/2012, 10:43 AM

## 2012-07-30 NOTE — ED Notes (Signed)
Pt states that he has been sob since discharge

## 2012-07-30 NOTE — Care Management Note (Signed)
CARE MANAGEMENT NOTE 07/30/2012  Patient:  [name], [name]   Account Number:  [id number]  Date Initiated:  07/30/2012  Documentation initiated by:  Devell Parkerson  Subjective/Objective Assessment:   57 yo male admitted with CHF. No record of home oxygen therapy. PCP: Rene Paci, MD     Action/Plan:   Home when stable   Anticipated DC Date:     Anticipated DC Plan:  HOME W HOME HEALTH SERVICES      DC Planning Services  CM consult      Choice offered to / List presented to:  NA   DME arranged  NA      DME agency  NA     HH arranged  NA      HH agency  NA   Status of service:  In process, will continue to follow Medicare Important Message given?   (If response is "NO", the following Medicare IM given date fields will be blank) Date Medicare IM given:   Date Additional Medicare IM given:    Discharge Disposition:    Per UR Regulation:  Reviewed for med. necessity/level of care/duration of stay  If discussed at Long Length of Stay Meetings, dates discussed:    Comments:  07/30/12 1226 Domonique Brouillard,RN,BSN [phone] Chart reviewed for utilization of services. Cm consult for HF home screen. Pt currelty on 2L n/c. RN to assess possible oxygen needs if pt unable to wean off 02 prior to discharge.

## 2012-07-31 DIAGNOSIS — I5023 Acute on chronic systolic (congestive) heart failure: Principal | ICD-10-CM

## 2012-07-31 DIAGNOSIS — E7800 Pure hypercholesterolemia, unspecified: Secondary | ICD-10-CM

## 2012-07-31 LAB — GLUCOSE, CAPILLARY
Glucose-Capillary: 107 mg/dL — ABNORMAL HIGH (ref 70–99)
Glucose-Capillary: 108 mg/dL — ABNORMAL HIGH (ref 70–99)
Glucose-Capillary: 94 mg/dL (ref 70–99)
Glucose-Capillary: 126 mg/dL — ABNORMAL HIGH (ref 70–99)

## 2012-07-31 LAB — CBC
HCT: 35.4 % — ABNORMAL LOW (ref 39.0–52.0)
Hemoglobin: 11.8 g/dL — ABNORMAL LOW (ref 13.0–17.0)
MCH: 29.4 pg (ref 26.0–34.0)
MCHC: 33.3 g/dL (ref 30.0–36.0)
MCV: 88.3 fL (ref 78.0–100.0)
Platelets: 290 10*3/uL (ref 150–400)
RBC: 4.01 MIL/uL — ABNORMAL LOW (ref 4.22–5.81)
RDW: 13.2 % (ref 11.5–15.5)
WBC: 8.2 10*3/uL (ref 4.0–10.5)

## 2012-07-31 LAB — COMPREHENSIVE METABOLIC PANEL
ALT: 22 U/L (ref 0–53)
AST: 19 U/L (ref 0–37)
Albumin: 3.1 g/dL — ABNORMAL LOW (ref 3.5–5.2)
Alkaline Phosphatase: 74 U/L (ref 39–117)
BUN: 21 mg/dL (ref 6–23)
CO2: 26 mEq/L (ref 19–32)
Calcium: 9.2 mg/dL (ref 8.4–10.5)
Chloride: 103 mEq/L (ref 96–112)
Creatinine, Ser: 1.56 mg/dL — ABNORMAL HIGH (ref 0.50–1.35)
GFR calc Af Amer: 55 mL/min — ABNORMAL LOW (ref >90)
GFR calc non Af Amer: 47 mL/min — ABNORMAL LOW (ref >90)
Glucose, Bld: 112 mg/dL — ABNORMAL HIGH (ref 70–99)
Potassium: 3.8 mEq/L (ref 3.5–5.1)
Sodium: 137 mEq/L (ref 135–145)
Total Bilirubin: 0.4 mg/dL (ref 0.3–1.2)
Total Protein: 6.7 g/dL (ref 6.0–8.3)

## 2012-07-31 LAB — PRO B NATRIURETIC PEPTIDE: Pro B Natriuretic peptide (BNP): 320.5 pg/mL — ABNORMAL HIGH (ref 0–125)

## 2012-07-31 MED ORDER — FUROSEMIDE 10 MG/ML IJ SOLN
40.0000 mg | Freq: Two times a day (BID) | INTRAMUSCULAR | Status: DC
  Administered 2012-07-31 – 2012-08-01 (×2): 40 mg via INTRAVENOUS
  Filled 2012-07-31 (×3): qty 4

## 2012-07-31 MED ORDER — ISOSORB DINITRATE-HYDRALAZINE 20-37.5 MG PO TABS
1.0000 | ORAL_TABLET | Freq: Two times a day (BID) | ORAL | Status: DC
  Administered 2012-07-31 – 2012-08-02 (×5): 1 via ORAL
  Filled 2012-07-31 (×6): qty 1

## 2012-07-31 NOTE — Progress Notes (Signed)
  Echocardiogram 2D Echocardiogram has been performed.  Cathie Beams 07/31/2012, 9:27 AM

## 2012-07-31 NOTE — Progress Notes (Signed)
[facility] PROGRESS NOTE  [name] ZOX:[id number] DOB: 1955-03-22 DOA: 07/30/2012 PCP: Rene Paci, MD  Assessment/Plan: 1-Acute on chronic systolic heart failure: EF 20-25%; diffuse hypokinesis. (2-De cho 07/2012). -will continue lasix IV 40mg  BID -continue B-blockers -continue nitrates -spironolactone and ACE/ARB on hold due to ARF.  2-HLD: continue statins  3-HTN: well controlled currently. Will continue BIDIL and metoprolol  4-acute renal failure: per records he had stage 2 renal insufficiency most likely due to HTN/DM. Currently worse due to diuretics. -watch Cr trend -Lasix adjust to BID -holding other nephrotoxic agents.  5-DM type 2: continue SSI. A1C 6.2  WJX:[id number]   Code Status: Full Family Communication: no family at bedside Disposition Plan: home with [facility] when stable   Consultants:  Cardiology (heart failure team curbside, Dr. Gala Romney)  Procedures:  2-D echo: diffuse hypokinesis; no significant valvular abnormalities and EF 20-25%  Antibiotics:  none  HPI/Subjective: Feeling tire, no CP; SOB improved.  Objective: Filed Vitals:   07/30/12 1412 07/30/12 2148 07/31/12 0521 07/31/12 1300  BP: 111/77 132/83 139/65 129/70  Pulse: 79 70 65 69  Temp: 98.8 F (37.1 C) 97.8 F (36.6 C) 97.6 F (36.4 C) 97.9 F (36.6 C)  TempSrc: Oral Oral Oral Oral  Resp: 20 18 18 16   Height:      Weight:   81.647 kg (180 lb)   SpO2: 100% 100% 100% 100%    Intake/Output Summary (Last 24 hours) at 07/31/12 1427 Last data filed at 07/31/12 0937  Gross per 24 hour  Intake    243 ml  Output    950 ml  Net   -707 ml   Filed Weights   07/30/12 0844 07/31/12 0521  Weight: 81.5 kg (179 lb 10.8 oz) 81.647 kg (180 lb)    Exam:   General:  Feeling tire, no CP; SOB improved.  Cardiovascular: no rubs, no gallops, no murmur; S1 and S2 appreciated  Respiratory: fine crackles at bases, no wheezing  Abdomen: soft, NT, ND, positive  BS  Musculoskeletal: no edema, no cyanosis  Neuro: non focal deficit on exam.  Data Reviewed: Basic Metabolic Panel:  Recent Labs Lab 07/30/12 0247 07/30/12 1112 07/31/12 0509  NA 138  --  137  K 3.7  --  3.8  CL 106  --  103  CO2 21  --  26  GLUCOSE 127*  --  112*  BUN 22  --  21  CREATININE 1.43* 1.48* 1.56*  CALCIUM 9.0  --  9.2   Liver Function Tests:  Recent Labs Lab 07/31/12 0509  AST 19  ALT 22  ALKPHOS 74  BILITOT 0.4  PROT 6.7  ALBUMIN 3.1*   CBC:  Recent Labs Lab 07/30/12 0247 07/30/12 1112 07/31/12 0509  WBC 8.6 7.7 8.2  HGB 11.9* 12.8* 11.8*  HCT 35.7* 37.9* 35.4*  MCV 88.4 87.9 88.3  PLT 281 332 290   Cardiac Enzymes:  Recent Labs Lab 07/30/12 1112 07/30/12 1641 07/30/12 2245  TROPONINI <0.30 <0.30 <0.30   BNP (last 3 results)  Recent Labs  07/17/12 0435 07/30/12 0425 07/31/12 0509  PROBNP 1232.0* 542.8* 320.5*   CBG:  Recent Labs Lab 07/30/12 1137 07/30/12 1632 07/30/12 2153 07/31/12 0716 07/31/12 1147  GLUCAP 134* 81 99 107* 108*    Studies: Dg Chest 2 View (if Patient Has Fever And/or Copd)  07/30/2012   *RADIOLOGY REPORT*  Clinical Data: Short of breath.  CHEST - 2 VIEW  Comparison: 07/17/2012.  Findings: Cardiomegaly and pulmonary vascular congestion.  No airspace disease.  Scattered areas of subsegmental atelectasis and / or scarring. Monitoring leads are projected over the chest. No focal consolidation.  No effusion.  Thickening of the fissures is present on the lateral view, compatible with early interstitial edema.  IMPRESSION: Cardiomegaly and pulmonary vascular congestion, similar to the most recent prior examination. Interstitial edema with thickening of the fissures.   Original Report Authenticated By: Andreas Newport, M.D.    Scheduled Meds: . aspirin EC  81 mg Oral Daily  . atorvastatin  20 mg Oral q1800  . carvedilol  12.5 mg Oral BID WC  . furosemide  40 mg Intravenous BID  . heparin  5,000 Units  Subcutaneous Q8H  . insulin aspart  0-15 Units Subcutaneous TID WC  . insulin aspart  0-5 Units Subcutaneous QHS  . isosorbide-hydrALAZINE  1 tablet Oral BID  . sodium chloride  3 mL Intravenous Q12H   Continuous Infusions:   Principal Problem:   Acute systolic congestive heart failure Active Problems:   DIABETES MELLITUS, TYPE II   HYPERCHOLESTEROLEMIA   Non compliance w medication regimen   HTN (hypertension), benign    Time spent: >30 minutes    [name]  [facility] Pager [phone]. If 7PM-7AM, please contact night-coverage at [email or web address], password [facility] 07/31/2012, 2:27 PM  LOS: 1 day

## 2012-08-01 LAB — BASIC METABOLIC PANEL
BUN: 25 mg/dL — ABNORMAL HIGH (ref 6–23)
CO2: 26 mEq/L (ref 19–32)
Calcium: 9.2 mg/dL (ref 8.4–10.5)
Chloride: 101 mEq/L (ref 96–112)
Creatinine, Ser: 1.55 mg/dL — ABNORMAL HIGH (ref 0.50–1.35)
GFR calc Af Amer: 55 mL/min — ABNORMAL LOW (ref >90)
GFR calc non Af Amer: 48 mL/min — ABNORMAL LOW (ref >90)
Glucose, Bld: 118 mg/dL — ABNORMAL HIGH (ref 70–99)
Potassium: 3.5 mEq/L (ref 3.5–5.1)
Sodium: 136 mEq/L (ref 135–145)

## 2012-08-01 LAB — GLUCOSE, CAPILLARY
Glucose-Capillary: 108 mg/dL — ABNORMAL HIGH (ref 70–99)
Glucose-Capillary: 109 mg/dL — ABNORMAL HIGH (ref 70–99)
Glucose-Capillary: 144 mg/dL — ABNORMAL HIGH (ref 70–99)
Glucose-Capillary: 104 mg/dL — ABNORMAL HIGH (ref 70–99)

## 2012-08-01 MED ORDER — FUROSEMIDE 40 MG PO TABS
40.0000 mg | ORAL_TABLET | Freq: Two times a day (BID) | ORAL | Status: DC
  Administered 2012-08-01 – 2012-08-02 (×2): 40 mg via ORAL
  Filled 2012-08-01 (×4): qty 1

## 2012-08-01 NOTE — Progress Notes (Signed)
[facility] PROGRESS NOTE  Makana Rostad AVW:[id number] DOB: 1955-12-05 DOA: 07/30/2012 PCP: Rene Paci, MD  Assessment/Plan: 1-Acute on chronic systolic heart failure: EF 20-25%; diffuse hypokinesis. (2-De cho 07/2012). -will change lasix to 40mg  PO BID -continue B-blockers -continue nitrates -spironolactone and ACE/ARB on hold due to ARF. -case discussed with heart failure team and plan is for outpatient follow up. -CE'z negative X 3, no abnormalities on telemetry and no CP.  2-HLD: continue statins  3-HTN: well controlled currently. Will continue BIDIL and metoprolol  4-acute renal failure: per records he had stage 2 renal insufficiency most likely due to HTN/DM. Currently worse due to diuretics. -watch Cr trend (stable at 1.55) -Lasix adjust to BID and changed to PO today -holding other nephrotoxic agents.  5-DM type 2: continue SSI. A1C 6.2 -due to renal failure will discontinue metformin -will use amaryl and low carb diet at discharge  WGN:[id number]   Code Status: Full Family Communication: no family at bedside Disposition Plan: home with [facility] when stable   Consultants:  Cardiology (heart failure team curbside, Dr. Gala Romney)  Procedures:  2-D echo: diffuse hypokinesis; no significant valvular abnormalities and EF 20-25%  Antibiotics:  none  HPI/Subjective: Feeling tire, no CP; SOB improved. Negative apporx 2L since admisison  Objective: Filed Vitals:   07/31/12 0521 07/31/12 1300 07/31/12 2053 08/01/12 0501  BP: 139/65 129/70 129/73 124/77  Pulse: 65 69 72 68  Temp: 97.6 F (36.4 C) 97.9 F (36.6 C) 98.2 F (36.8 C) 98.4 F (36.9 C)  TempSrc: Oral Oral Oral Oral  Resp: 18 16 18 16   Height:      Weight: 81.647 kg (180 lb)   80.8 kg (178 lb 2.1 oz)  SpO2: 100% 100% 100% 100%    Intake/Output Summary (Last 24 hours) at 08/01/12 1447 Last data filed at 08/01/12 0900  Gross per 24 hour  Intake   1080 ml  Output   2350 ml  Net  -1270 ml    Filed Weights   07/30/12 0844 07/31/12 0521 08/01/12 0501  Weight: 81.5 kg (179 lb 10.8 oz) 81.647 kg (180 lb) 80.8 kg (178 lb 2.1 oz)    Exam:   General:  Feeling tire, no CP; SOB improved, but with DOE  Cardiovascular: no rubs, no gallops, no murmur; S1 and S2 appreciated  Respiratory: fine crackles at bases, no wheezing  Abdomen: soft, NT, ND, positive BS  Musculoskeletal: no edema, no cyanosis  Neuro: non focal deficit on exam.  Data Reviewed: Basic Metabolic Panel:  Recent Labs Lab 07/30/12 0247 07/30/12 1112 07/31/12 0509 08/01/12 0516  NA 138  --  137 136  K 3.7  --  3.8 3.5  CL 106  --  103 101  CO2 21  --  26 26  GLUCOSE 127*  --  112* 118*  BUN 22  --  21 25*  CREATININE 1.43* 1.48* 1.56* 1.55*  CALCIUM 9.0  --  9.2 9.2   Liver Function Tests:  Recent Labs Lab 07/31/12 0509  AST 19  ALT 22  ALKPHOS 74  BILITOT 0.4  PROT 6.7  ALBUMIN 3.1*   CBC:  Recent Labs Lab 07/30/12 0247 07/30/12 1112 07/31/12 0509  WBC 8.6 7.7 8.2  HGB 11.9* 12.8* 11.8*  HCT 35.7* 37.9* 35.4*  MCV 88.4 87.9 88.3  PLT 281 332 290   Cardiac Enzymes:  Recent Labs Lab 07/30/12 1112 07/30/12 1641 07/30/12 2245  TROPONINI <0.30 <0.30 <0.30   BNP (last 3 results)  Recent Labs  07/17/12 0435 07/30/12 0425 07/31/12 0509  PROBNP 1232.0* 542.8* 320.5*   CBG:  Recent Labs Lab 07/31/12 1147 07/31/12 1625 07/31/12 2102 08/01/12 0834 08/01/12 1208  GLUCAP 108* 94 126* 108* 109*    Studies: No results found.  Scheduled Meds: . aspirin EC  81 mg Oral Daily  . atorvastatin  20 mg Oral q1800  . carvedilol  12.5 mg Oral BID WC  . furosemide  40 mg Oral BID  . heparin  5,000 Units Subcutaneous Q8H  . insulin aspart  0-15 Units Subcutaneous TID WC  . insulin aspart  0-5 Units Subcutaneous QHS  . isosorbide-hydrALAZINE  1 tablet Oral BID  . sodium chloride  3 mL Intravenous Q12H   Continuous Infusions:   Principal Problem:   Acute systolic  congestive heart failure Active Problems:   DIABETES MELLITUS, TYPE II   HYPERCHOLESTEROLEMIA   Non compliance w medication regimen   HTN (hypertension), benign    Time spent: >30 minutes    Keyari Kleeman  [facility] Pager [phone]. If 7PM-7AM, please contact night-coverage at [email or web address], password [facility] 08/01/2012, 2:47 PM  LOS: 2 days

## 2012-08-02 DIAGNOSIS — Z9119 Patient's noncompliance with other medical treatment and regimen: Secondary | ICD-10-CM

## 2012-08-02 LAB — GLUCOSE, CAPILLARY: Glucose-Capillary: 105 mg/dL — ABNORMAL HIGH (ref 70–99)

## 2012-08-02 LAB — BASIC METABOLIC PANEL
BUN: 24 mg/dL — ABNORMAL HIGH (ref 6–23)
CO2: 26 mEq/L (ref 19–32)
Calcium: 9.3 mg/dL (ref 8.4–10.5)
Chloride: 102 mEq/L (ref 96–112)
Creatinine, Ser: 1.55 mg/dL — ABNORMAL HIGH (ref 0.50–1.35)
GFR calc Af Amer: 55 mL/min — ABNORMAL LOW (ref >90)
GFR calc non Af Amer: 48 mL/min — ABNORMAL LOW (ref >90)
Glucose, Bld: 107 mg/dL — ABNORMAL HIGH (ref 70–99)
Potassium: 3.9 mEq/L (ref 3.5–5.1)
Sodium: 136 mEq/L (ref 135–145)

## 2012-08-02 MED ORDER — CARVEDILOL 12.5 MG PO TABS: 12.5000 mg | ORAL_TABLET | Freq: Two times a day (BID) | ORAL | Status: DC

## 2012-08-02 MED ORDER — FUROSEMIDE 40 MG PO TABS: 40.0000 mg | ORAL_TABLET | Freq: Two times a day (BID) | ORAL | Status: DC

## 2012-08-02 MED ORDER — ISOSORB DINITRATE-HYDRALAZINE 20-37.5 MG PO TABS: 1.0000 | ORAL_TABLET | Freq: Two times a day (BID) | ORAL | Status: DC

## 2012-08-02 NOTE — Progress Notes (Signed)
Pt has chosen [facility] for [facility] services, referral made.  Algernon Huxley RN BSN  [phone]

## 2012-08-02 NOTE — Discharge Summary (Signed)
Physician Discharge Summary  [name] BJY:[id number] DOB: Jul 24, 1955 DOA: 07/30/2012  PCP: Rene Paci, MD  Admit date: 07/30/2012 Discharge date: 08/02/2012  Time spent: >30 minutes  Recommendations for Outpatient Follow-up:  1. Please reassess BP and adjust medications as needed 2. Follow BMET to assess renal function and electrolytes 3. Follow up with heart failure clinic 4. Follow blood sugar regimen now that he is not on metformin and decide dose adjustment or extra hypoglycemic agent base on his CBG levels  Discharge Diagnoses:  Acute on chronic systolic congestive heart failure (EF 20-25%) DIABETES MELLITUS, TYPE II HYPERCHOLESTEROLEMIA Non compliance w medication regimen HTN (hypertension), benign Acute on chronic renal failure   Discharge Condition: stable and improved. Mild DOE still present but significant improvement to his overall SOB and fatigue. Patient encourage to be compliant with medications and will start follow up with cardiology at heart failure clinic. Follow up with PCP in 7-10 days.   Diet recommendation: low carbohydrates and heart healthy diet. Fluid restriction to 1.2 L daily.  Filed Weights   07/31/12 0521 08/01/12 0501 08/02/12 0547  Weight: 81.647 kg (180 lb) 80.8 kg (178 lb 2.1 oz) 81.012 kg (178 lb 9.6 oz)    History of present illness:  57 y.o. male with a past medical history of systolic congestive heart failure, diabetes, hypertension, who is unfortunately, noncompliant with his medication. He had been not taking any of his medicines for approx 3-4 month. He presented to the emergency department at end of June with complaints of shortness of breath. He was given Nebulizer treatment and Lasix. He felt a little better at that time and he was discharged home. However, his breathing never got back to baseline. He has noted cough with clear expectoration. He denies any leg swelling. Denies any chest pain. No fever or chills. No sick contacts. No  recent travel. Denies any nausea, and vomiting. Since his breathing was not improving, he decided to present back to the hospital. Denies any weight gain. Denies any dietary indiscretion. He is aware that he needs to be more compliant with his medications. Denies any palpitations. Denies any dizziness.   Hospital Course:  1-Acute on chronic systolic heart failure: EF 20-25%; diffuse hypokinesis. (2-De cho 07/2012).  -will discharge on lasix to 40mg  PO BID  -continue B-blockers  -continue nitrates  -advise regarding heart healthy diet and fluid restriction -HHRN for heart failure follow up has been arranged. -spironolactone and ACE/ARB on hold due to Renal Failure.  -case discussed with heart failure team and plan is for outpatient follow up.  -CE'z negative X 3, no abnormalities on telemetry and no CP.  -will most likely need AICD at some point.  2-HLD: continue statins.  3-HTN: well controlled currently. Will continue BIDIL and metoprolol. -Outpatient follow up and further adjustments.  4-acute renal failure: per records he had stage 2 renal insufficiency most likely due to HTN/DM. Currently worse due to diuretics and use of ARB/metformin.  -close follow up at discharge (Cr has remained stable at 1.55)  -Lasix PO BID  -holding other nephrotoxic agents (ARB, spironolactone and metformin) .  5-DM type 2: A1C 6.2  -due to renal failure will discontinue metformin  -will use amaryl and low carb diet at discharge  *Rest of medical problems remains stable and the plan is to continue current medications regimen.   Procedures:  2-D echo: diffuse hypokinesis; no significant valvular abnormalities and EF 20-25%  Consultations:  Cardiology (heart failure team curbside, Dr. Gala Romney)   Discharge  Exam: Filed Vitals:   08/01/12 0501 08/01/12 1348 08/01/12 2136 08/02/12 0547  BP: 124/77 124/54 128/88 136/92  Pulse: 68 76 74 64  Temp: 98.4 F (36.9 C) 97.7 F (36.5 C) 98.6 F (37 C)  98.3 F (36.8 C)  TempSrc: Oral Oral Oral Oral  Resp: 16 18 20 18   Height:      Weight: 80.8 kg (178 lb 2.1 oz)   81.012 kg (178 lb 9.6 oz)  SpO2: 100% 100% 100% 100%   General: Feeling tire, no CP; SOB improved, but with mild DOE  Cardiovascular: no rubs, no gallops, no murmur; S1 and S2 appreciated on exam Respiratory: good air movement, no wheezing no crackles, no rhonchi  Abdomen: soft, NT, ND, positive BS  Musculoskeletal: no edema, no cyanosis  Neuro: non focal deficit on exam.   Discharge Instructions  Discharge Orders   Future Orders Complete By Expires     Discharge instructions  As directed     Comments:      Take medications as prescribed Follow with PCP in 7-10 days Follow with heart failure clinic (office will provide appointment details) Follow a low sodium heart healthy diet Limit your fluid intake to 1.2 Liters per day.    Face-to-face encounter (required for Medicare/Medicaid patients)  As directed     Comments:      I Jennae Hakeem certify that this patient is under my care and that I, or a nurse practitioner or physician's assistant working with me, had a face-to-face encounter that meets the physician face-to-face encounter requirements with this patient on 08/02/2012. The encounter with the patient was in whole, or in part for the following medical condition(s) which is the primary reason for home health care (List medical condition): heart failure follow up    Questions:      The encounter with the patient was in whole, or in part, for the following medical condition, which is the primary reason for home health care:  heart failure follow up    I certify that, based on my findings, the following services are medically necessary home health services:  Nursing    My clinical findings support the need for the above services:  Shortness of breath with activity    Further, I certify that my clinical findings support that this patient is homebound due to:  Shortness of  Breath with activity    Reason for Medically Necessary Home Health Services:  Skilled Nursing- Teaching of Disease Process/Symptom Management    Home Health  As directed     Questions:      To provide the following care/treatments:  RN        Medication List    STOP taking these medications       benazepril 40 MG tablet  Commonly known as:  LOTENSIN     metFORMIN 500 MG tablet  Commonly known as:  GLUCOPHAGE     spironolactone 50 MG tablet  Commonly known as:  ALDACTONE      TAKE these medications       aspirin EC 81 MG tablet  Take 81 mg by mouth daily.     atorvastatin 20 MG tablet  Commonly known as:  LIPITOR  Take 1 tablet (20 mg total) by mouth daily.     carvedilol 12.5 MG tablet  Commonly known as:  COREG  Take 1 tablet (12.5 mg total) by mouth 2 (two) times daily with a meal.     furosemide 40 MG tablet  Commonly  known as:  LASIX  Take 1 tablet (40 mg total) by mouth 2 (two) times daily.     glimepiride 2 MG tablet  Commonly known as:  AMARYL  Take 1 tablet (2 mg total) by mouth daily before breakfast.     isosorbide-hydrALAZINE 20-37.5 MG per tablet  Commonly known as:  BIDIL  Take 1 tablet by mouth 2 (two) times daily.       Allergies  Allergen Reactions  . Ambien (Zolpidem Tartrate) Other (See Comments)    "Went berserk."       Follow-up Information   Follow up with Rene Paci, MD. Schedule an appointment as soon as possible for a visit in 10 days.   Contact information:   [address] [place] [address] [phone]       Follow up with [facility]. (Call office to received iformation about appointment details)    Contact information:   [address] [id number] [place] [address] [phone]      The results of significant diagnostics from this hospitalization (including imaging, microbiology, ancillary and laboratory) are listed below  for reference.    Significant Diagnostic Studies: Dg Chest 2 View (if Patient Has Fever And/or Copd)  07/30/2012   *RADIOLOGY REPORT*  Clinical Data: Short of breath.  CHEST - 2 VIEW  Comparison: 07/17/2012.  Findings: Cardiomegaly and pulmonary vascular congestion.  No airspace disease.  Scattered areas of subsegmental atelectasis and / or scarring. Monitoring leads are projected over the chest. No focal consolidation.  No effusion.  Thickening of the fissures is present on the lateral view, compatible with early interstitial edema.  IMPRESSION: Cardiomegaly and pulmonary vascular congestion, similar to the most recent prior examination. Interstitial edema with thickening of the fissures.   Original Report Authenticated By: Andreas Newport, M.D.   Dg Chest 2 View  07/17/2012   *RADIOLOGY REPORT*  Clinical Data: Shortness of breath, history asthma, CHF, coronary artery disease, diabetes, hypertension  CHEST - 2 VIEW  Comparison: 08/25/2011  Findings: Enlargement of cardiac silhouette with pulmonary vascular congestion. Tortuous aorta. Lungs clear. No gross failure or acute consolidation identified. No pleural effusion or pneumothorax. Bones unremarkable.  IMPRESSION: Enlargement of cardiac silhouette with pulmonary vascular congestion. No definite acute infiltrate.   Original Report Authenticated By: Ulyses Southward, M.D.   Labs: Basic Metabolic Panel:  Recent Labs Lab 07/30/12 0247 07/30/12 1112 07/31/12 0509 08/01/12 0516 08/02/12 0500  NA 138  --  137 136 136  K 3.7  --  3.8 3.5 3.9  CL 106  --  103 101 102  CO2 21  --  26 26 26   GLUCOSE 127*  --  112* 118* 107*  BUN 22  --  21 25* 24*  CREATININE 1.43* 1.48* 1.56* 1.55* 1.55*  CALCIUM 9.0  --  9.2 9.2 9.3   Liver Function Tests:  Recent Labs Lab 07/31/12 0509  AST 19  ALT 22  ALKPHOS 74  BILITOT 0.4  PROT 6.7  ALBUMIN 3.1*   CBC:  Recent Labs Lab 07/30/12 0247 07/30/12 1112 07/31/12 0509  WBC 8.6 7.7 8.2  HGB 11.9* 12.8*  11.8*  HCT 35.7* 37.9* 35.4*  MCV 88.4 87.9 88.3  PLT 281 332 290   Cardiac Enzymes:  Recent Labs Lab 07/30/12 1112 07/30/12 1641 07/30/12 2245  TROPONINI <0.30 <0.30 <0.30   BNP: BNP (last 3 results)  Recent Labs  07/17/12 0435 07/30/12 0425 07/31/12  0509  PROBNP 1232.0* 542.8* 320.5*   CBG:  Recent Labs Lab 08/01/12 0834 08/01/12 1208 08/01/12 1738 08/01/12 2201 08/02/12 0744  GLUCAP 108* 109* 144* 104* 105*    Signed:  Jaquesha Boroff  [facility] 08/02/2012, 10:56 AM

## 2012-08-06 ENCOUNTER — Encounter (HOSPITAL_COMMUNITY): Payer: Self-pay

## 2012-08-06 ENCOUNTER — Ambulatory Visit (HOSPITAL_COMMUNITY)
Admission: RE | Admit: 2012-08-06 | Discharge: 2012-08-06 | Disposition: A | Discharge status: Home / Self Care | Payer: BC Managed Care – PPO | Source: Ambulatory Visit | Attending: Internal Medicine | Admitting: Internal Medicine

## 2012-08-06 VITALS — BP 150/94 | HR 85 | Wt 182.4 lb

## 2012-08-06 DIAGNOSIS — R0989 Other specified symptoms and signs involving the circulatory and respiratory systems: Secondary | ICD-10-CM

## 2012-08-06 DIAGNOSIS — I2510 Atherosclerotic heart disease of native coronary artery without angina pectoris: Secondary | ICD-10-CM | POA: Insufficient documentation

## 2012-08-06 DIAGNOSIS — Z91199 Patient's noncompliance with other medical treatment and regimen due to unspecified reason: Secondary | ICD-10-CM | POA: Insufficient documentation

## 2012-08-06 DIAGNOSIS — Z9119 Patient's noncompliance with other medical treatment and regimen: Secondary | ICD-10-CM | POA: Insufficient documentation

## 2012-08-06 DIAGNOSIS — I5022 Chronic systolic (congestive) heart failure: Secondary | ICD-10-CM | POA: Insufficient documentation

## 2012-08-06 DIAGNOSIS — I10 Essential (primary) hypertension: Secondary | ICD-10-CM | POA: Insufficient documentation

## 2012-08-06 DIAGNOSIS — R0609 Other forms of dyspnea: Secondary | ICD-10-CM

## 2012-08-06 DIAGNOSIS — I4949 Other premature depolarization: Secondary | ICD-10-CM | POA: Insufficient documentation

## 2012-08-06 DIAGNOSIS — R0683 Snoring: Secondary | ICD-10-CM

## 2012-08-06 DIAGNOSIS — E119 Type 2 diabetes mellitus without complications: Secondary | ICD-10-CM | POA: Insufficient documentation

## 2012-08-06 MED ORDER — HYDRALAZINE HCL 25 MG PO TABS: 25.0000 mg | ORAL_TABLET | Freq: Three times a day (TID) | ORAL | Status: DC

## 2012-08-06 MED ORDER — ISOSORBIDE MONONITRATE ER 30 MG PO TB24: 30.0000 mg | ORAL_TABLET | Freq: Every day | ORAL | Status: DC

## 2012-08-06 NOTE — Assessment & Plan Note (Addendum)
He presents as a new patient with NYHA II-III class.  Volume status stable. Continue current dose of carvedilol.  Will start IMDUR 30 mg daily and hydralazine 25 mg tid. Will need to Holter Monitor for 48 hours to quantify PVCs. Refer to pulmonary for sleep study.  Provide a scale and weight chart for daily weights.  Reinforced low salt food choices, limiting fluid intake to < 2 liters per day, and medication compliance. Follow up in 2-3 weeks and continue to titrate HF meds.repeat ECHO in 3 months after HF meds optimized. If EF remains < 35% will need to refer for ICD. Will leave off spiro/ace due to CKD for now but hopefully can try to start soon.  Patient seen and examined with Tonye Becket, NP. We discussed all aspects of the encounter. I agree with the assessment and plan as stated above. I suspect he has NICM due to HTN but may also be related to PVCs, OSA or CAD. Will place Holter monitor and order sleep study. Extensive discussion >50 mins about importance of medication compliance, daily weights, use of sliding scale lasix, etc. Agree with med changes as above. All questions answered. We will follow closely.

## 2012-08-06 NOTE — Patient Instructions (Addendum)
We will schedule Holter monitor  Take hydralazine 25 mg three times a day  Take IMDUR 30 mg daily  Follow up in 2-3 weeks  Do the following things EVERYDAY: 1) Weigh yourself in the morning before breakfast. Write it down and keep it in a log. 2) Take your medicines as prescribed 3) Eat low salt foods-Limit salt (sodium) to 2000 mg per day.  4) Stay as active as you can everyday 5) Limit all fluids for the day to less than 2 liters

## 2012-08-06 NOTE — Progress Notes (Signed)
Patient ID: [name], male   DOB: Jan 03, 1956, 57 y.o.   MRN: [id number]  Advanced HF Clinic Consult  Weight Range  182 pounds  Baseline proBNP     HPI: [name] is referred to HF clinic by Dr Gwenlyn Perking for ong oign HF management.   [name] is a 57 year old with a history of HTN, PVCs, chronic systolic heart failure, last cardiac cath 2005 Nonobstructive coronary artery disease (first diagonal 25% stenosis, ramus intermedius 25% stenosis) and borderline DM2.   2009 ECHO EF 45% 2013 ECHO EF 25%   07/30/12 ECHO EF 20-25%  He has a long h/o HF as described above. Previous cath with minimal CAD. He says started feeling bad in June. He noticed increased dyspnea.   Admitted to [facility] 07/30/12 through 08/02/12 with mild acute/chronic systolic heart failure in setting on medication noncompliance. CEs negative. CKD noted 1.55. pBNP 320 (previous 1200)  He has been out of his medications 2-3 months. He diuresed 2 pounds and was discharged. Discharge weight 178 pounds. Previous spiro and ace stopped due to CRI. Bidil started.  He presents as a new patient with his daughter. Complains of fatigue and dyspnea with exertion. He takes multiple breaks when walking. + PND + Orthopnea sleeps on 2 pillows or recliner. He does not have scale at home. He says he is taking his medications however he has not started BIDIL.  He works full time with mentally challenged adults. Snores a lot.   ROS: Psych: denies depression/anxiety Ophthalmic ROS: negative  ENT ROS: negative  Allergy and Immunology ROS: negative  Hematological and Lymphatic ROS: negative  Endocrine ROS: negative  Respiratory ROS: as in hpi  Cardiovascular ROS: as in hpi  Gastrointestinal ROS: no abdominal pain, change in bowel habits, or black or bloody stools  Genito-Urinary ROS: no dysuria, trouble voiding, or hematuria  Musculoskeletal ROS: negative  Neurological ROS: no TIA or stroke symptoms  Dermatological ROS: negative   Past Medical  History  Diagnosis Date  . Asthma   . CAD     nonobst cath 2004  . CARDIOMYOPATHY     NICM, LVEF 30% 01/2011 echo  . DIABETES MELLITUS, TYPE II   . HYPERCHOLESTEROLEMIA   . HYPERTENSION   . PSA, INCREASED   . Noncompliance   . CHF (congestive heart failure)     History   Social History  . Marital Status: Legally Separated    Spouse Name: N/A    Number of Children: N/A  . Years of Education: N/A   Social History Main Topics  . Smoking status: Never Smoker   . 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 Alcohol Use: Yes     Comment: rare  . Drug Use: No  . Sexually Active: No   Other Topics Concern  . None   Social History Narrative   Single - divorced x 3 -   Lives with 2 sons, enjoys spending time with 6 g-kids and 3 kids   Family History  Problem Relation Age of Onset  . Peripheral vascular disease    . Hypertension    . Prostate cancer    . Hypertension Mother   . Peripheral vascular disease Mother   . Prostate cancer Brother      Current Outpatient Prescriptions  Medication Sig Dispense Refill  . aspirin EC 81 MG tablet Take 81 mg by mouth daily.      [name]  atorvastatin (LIPITOR) 20 MG tablet Take 1 tablet (20 mg total) by mouth daily.  30 tablet  11  . carvedilol (COREG) 12.5 MG tablet Take 1 tablet (12.5 mg total) by mouth 2 (two) times daily with a meal.  60 tablet  5  . furosemide (LASIX) 40 MG tablet Take 1 tablet (40 mg total) by mouth 2 (two) times daily.  60 tablet  4  . glimepiride (AMARYL) 2 MG tablet Take 1 tablet (2 mg total) by mouth daily before breakfast.  30 tablet  11  . isosorbide-hydrALAZINE (BIDIL) 20-37.5 MG per tablet Take 1 tablet by mouth 2 (two) times daily.  60 tablet  3   No current facility-administered medications for this encounter.     PHYSICAL EXAM: Filed Vitals:   08/06/12 1051  BP: 150/94  Pulse: 85  Weight: 182 lb 6.4 oz (82.736 kg)  SpO2: 100%    General:   Well appearing. No resp difficulty HEENT: normal Neck: supple. JVP flat. Carotids 2+ bilaterally; no bruits. No lymphadenopathy or thryomegaly appreciated. Cor: PMI normal. Regular rate & rhythm. No rubs, gallops or murmurs. Lungs: clear Abdomen: soft, nontender, nondistended. No hepatosplenomegaly. No bruits or masses. Good bowel sounds. Extremities: no cyanosis, clubbing, rash, edema Neuro: alert & orientedx3, cranial nerves grossly intact. Moves all 4 extremities w/o difficulty. Affect pleasant.     ASSESSMENT & PLAN:

## 2012-08-08 ENCOUNTER — Emergency Department (HOSPITAL_COMMUNITY)
Admission: EM | Admit: 2012-08-08 | Discharge: 2012-08-08 | Disposition: A | Discharge status: Home / Self Care | Payer: BC Managed Care – PPO | Attending: Emergency Medicine | Admitting: Emergency Medicine

## 2012-08-08 ENCOUNTER — Encounter (HOSPITAL_COMMUNITY): Payer: Self-pay | Admitting: Emergency Medicine

## 2012-08-08 DIAGNOSIS — I2510 Atherosclerotic heart disease of native coronary artery without angina pectoris: Secondary | ICD-10-CM | POA: Insufficient documentation

## 2012-08-08 DIAGNOSIS — Z8639 Personal history of other endocrine, nutritional and metabolic disease: Secondary | ICD-10-CM | POA: Insufficient documentation

## 2012-08-08 DIAGNOSIS — Z91199 Patient's noncompliance with other medical treatment and regimen due to unspecified reason: Secondary | ICD-10-CM | POA: Insufficient documentation

## 2012-08-08 DIAGNOSIS — R51 Headache: Secondary | ICD-10-CM | POA: Insufficient documentation

## 2012-08-08 DIAGNOSIS — I509 Heart failure, unspecified: Secondary | ICD-10-CM | POA: Insufficient documentation

## 2012-08-08 DIAGNOSIS — R002 Palpitations: Secondary | ICD-10-CM | POA: Insufficient documentation

## 2012-08-08 DIAGNOSIS — Z862 Personal history of diseases of the blood and blood-forming organs and certain disorders involving the immune mechanism: Secondary | ICD-10-CM | POA: Insufficient documentation

## 2012-08-08 DIAGNOSIS — E119 Type 2 diabetes mellitus without complications: Secondary | ICD-10-CM | POA: Insufficient documentation

## 2012-08-08 DIAGNOSIS — Z8679 Personal history of other diseases of the circulatory system: Secondary | ICD-10-CM | POA: Insufficient documentation

## 2012-08-08 DIAGNOSIS — Z9119 Patient's noncompliance with other medical treatment and regimen: Secondary | ICD-10-CM | POA: Insufficient documentation

## 2012-08-08 DIAGNOSIS — J45909 Unspecified asthma, uncomplicated: Secondary | ICD-10-CM | POA: Insufficient documentation

## 2012-08-08 DIAGNOSIS — I10 Essential (primary) hypertension: Secondary | ICD-10-CM | POA: Insufficient documentation

## 2012-08-08 DIAGNOSIS — Z7982 Long term (current) use of aspirin: Secondary | ICD-10-CM | POA: Insufficient documentation

## 2012-08-08 NOTE — ED Notes (Signed)
Pt states that he woke up with a HA and so he took his BP meds. States that when he took his BP at home it was 216/104. BP on arrival to ED was 158/95. Pt denies any pain or other s/s.

## 2012-08-08 NOTE — ED Provider Notes (Signed)
History  This chart was scribed for non-physician practitioner working with Bradley Munch, MD by Greggory Stallion, ED scribe. This patient was seen in room WTR3/WLPT3 and the patient's care was started at 9:54 PM.  CSN: [id number] Arrival date & time 08/08/12  2140   Chief Complaint  Patient presents with  . Hypertension   The history is provided by the patient and medical records. No language interpreter was used.    HPI Comments: [name] is a 57 y.o. male who presents to the Emergency Department complaining of HA that started when he woke up. Pt states his heart was also beating very hard. He states he took his BP medication after he woke up and two other times today as well as a Goody powder for his headache. He states his BP was 216/104 at home this evening when he checked it. Patient presents to the emergency room due to family concern. He states his headache is now resolved. Pt denies leg swelling, CP, visual disturbance, fever, chills, abdominal pain, weakness, numbness and tingling as associated symptoms. He states he took a Scientist, product/process development for his HA with relief. Pt states he just got out of the hospital for CHF 2 weeks ago. He was evaluated last week for a [facility] with a routine exam. He states he is supposed to go to the cardiologist and his primary care physician tomorrow.   Past Medical History  Diagnosis Date  . Asthma   . CAD     nonobst cath 2004  . CARDIOMYOPATHY     NICM, LVEF 30% 01/2011 echo  . DIABETES MELLITUS, TYPE II   . HYPERCHOLESTEROLEMIA   . HYPERTENSION   . PSA, INCREASED   . Noncompliance   . CHF (congestive heart failure)    Past Surgical History  Procedure Laterality Date  . Cardiac catheterization  2004    nonobst dz  . Surgery scrotal / testicular      at age 18   Family History  Problem Relation Age of Onset  . Peripheral vascular disease    . Hypertension    . Prostate cancer    . Hypertension Mother   . Peripheral vascular  disease Mother   . Prostate cancer Brother    History  Substance Use Topics  . Smoking status: Never Smoker   . 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 Alcohol Use: Yes     Comment: rare    Review of Systems  Constitutional: Negative for fever, chills, diaphoresis, appetite change, fatigue and unexpected weight change.  HENT: Negative for mouth sores and neck stiffness.   Eyes: Negative for visual disturbance.  Respiratory: Negative for cough, chest tightness, shortness of breath and wheezing.   Cardiovascular: Positive for palpitations. Negative for chest pain.  Gastrointestinal: Negative for nausea, vomiting, abdominal pain, diarrhea and constipation.  Endocrine: Negative for polydipsia, polyphagia and polyuria.  Genitourinary: Negative for dysuria, urgency, frequency and hematuria.  Musculoskeletal: Negative for back pain.  Skin: Negative for rash.  Allergic/Immunologic: Negative for immunocompromised state.  Neurological: Positive for headaches. Negative for syncope, light-headedness and numbness.  Hematological: Does not bruise/bleed easily.  Psychiatric/Behavioral: Negative for sleep disturbance. The patient is not nervous/anxious.   All other systems reviewed and are negative.    Allergies  Ambien  Home Medications   Current Outpatient Rx  Name  Route  Sig  Dispense  Refill  . aspirin EC 81 MG tablet  Oral   Take 81 mg by mouth daily.         . carvedilol (COREG) 12.5 MG tablet   Oral   Take 1 tablet (12.5 mg total) by mouth 2 (two) times daily with a meal.   60 tablet   5   . furosemide (LASIX) 40 MG tablet   Oral   Take 1 tablet (40 mg total) by mouth 2 (two) times daily.   60 tablet   4   . glimepiride (AMARYL) 2 MG tablet   Oral   Take 1 tablet (2 mg total) by mouth daily before breakfast.   30 tablet   11   . hydrALAZINE (APRESOLINE) 25 MG tablet   Oral   Take 1 tablet (25 mg  total) by mouth 3 (three) times daily.   90 tablet   3   . isosorbide mononitrate (IMDUR) 30 MG 24 hr tablet   Oral   Take 1 tablet (30 mg total) by mouth daily.   30 tablet   3    BP 158/95  Pulse 91  Temp(Src) 98.8 F (37.1 C) (Oral)  Resp 16  Ht 5\' 5"  (1.651 m)  Wt 180 lb (81.647 kg)  BMI 29.95 kg/m2  SpO2 97%  Physical Exam  Nursing note and vitals reviewed. Constitutional: He is oriented to person, place, and time. He appears well-developed and well-nourished. No distress.  Awake, alert, nontoxic appearance  HENT:  Head: Normocephalic and atraumatic.  Mouth/Throat: Oropharynx is clear and moist and mucous membranes are normal. Mucous membranes are not dry. No edematous. No oropharyngeal exudate, posterior oropharyngeal edema, posterior oropharyngeal erythema or tonsillar abscesses.  Tongue is midline  Eyes: Conjunctivae and EOM are normal. Pupils are equal, round, and reactive to light. No scleral icterus.  Neck: Normal range of motion. Neck supple.  Cardiovascular: Normal rate, normal heart sounds and intact distal pulses.  An irregular rhythm present.  Pulses:      Radial pulses are 2+ on the right side, and 2+ on the left side.       Dorsalis pedis pulses are 2+ on the right side, and 2+ on the left side.       Posterior tibial pulses are 2+ on the right side, and 2+ on the left side.  Capillary refill less than 3 seconds  Pulmonary/Chest: Effort normal and breath sounds normal. No accessory muscle usage. Not tachypneic. No respiratory distress. He has no decreased breath sounds. He has no wheezes. He has no rhonchi. He has no rales.  No rales on exam  Abdominal: Soft. Bowel sounds are normal. He exhibits no mass. There is no tenderness. There is no rebound and no guarding.  Musculoskeletal: Normal range of motion. He exhibits no edema and no tenderness.  No peripheral edema  Lymphadenopathy:    He has no cervical adenopathy.  Neurological: He is alert and oriented  to person, place, and time. He has normal strength and normal reflexes. No cranial nerve deficit or sensory deficit. He exhibits normal muscle tone. He displays a negative Romberg sign. Coordination and gait normal. GCS eye subscore is 4. GCS verbal subscore is 5. GCS motor subscore is 6.  Reflex Scores:      Tricep reflexes are 2+ on the right side and 2+ on the left side.      Bicep reflexes are 2+ on the right side and 2+ on the left side.      Brachioradialis reflexes are 2+ on the right side and  2+ on the left side.      Patellar reflexes are 2+ on the right side and 2+ on the left side.      Achilles reflexes are 2+ on the right side and 2+ on the left side. Speech is clear and goal oriented, follows commands Major Cranial nerves without deficit, no facial droop Normal strength in upper and lower extremities bilaterally including dorsiflexion and plantar flexion, strong and equal grip strength Sensation normal to light and sharp touch Moves extremities without ataxia, coordination intact Normal finger to nose and rapid alternating movements Neg romberg, no pronator drift Normal gait and balance  Skin: Skin is warm and dry. No rash noted. He is not diaphoretic. No erythema.  Psychiatric: He has a normal mood and affect.    ED Course  Procedures (including critical care time)  DIAGNOSTIC STUDIES: Oxygen Saturation is 97% on RA, normal by my interpretation.    COORDINATION OF CARE: 10:04 PM-Discussed treatment plan which includes discharge with pt at bedside and pt agreed to plan. Advised pt to come back to ED tonight if his symptoms worsen. Advised pt to follow up with Dr. Houston Siren tomorrow morning.   Labs Reviewed - No data to display No results found. 1. HTN (hypertension)     MDM  Earlean Shawl presents with a history of hypertension, reported hypertension at home and now resolved headache and palpitations.  Patient with a normal neurologic exam, breath sounds clear and  equal without rails, no evidence of pulmonary edema. Patient without peripheral edema. Patient completely asymptomatic at this time with blood pressure of 158/95.  Patient wishes to return home tonight and be evaluated because his primary care and cardiology and is on a scheduled appointment tomorrow.  Discussed with patient at length warning signs of MI and CVA and my concerns concerns about his return home including the fact that an MI will present atypically for him.[name] He states he has never had an MI and he understands my concerns.  Patient agreeable to return to the emergency department tonight if symptoms return.  Patient agrees to followup at his arteries he appointment tomorrow.  Dr. Jeraldine Loots was consulted and agrees with the plan.    I personally performed the services described in this documentation, which was scribed in my presence. The recorded information has been reviewed and is accurate.  Dahlia Client Jadalee Westcott, PA-C 08/08/12 2217

## 2012-08-09 ENCOUNTER — Telehealth: Payer: Self-pay | Admitting: *Deleted

## 2012-08-09 ENCOUNTER — Encounter: Payer: Self-pay | Admitting: Internal Medicine

## 2012-08-09 ENCOUNTER — Encounter (INDEPENDENT_AMBULATORY_CARE_PROVIDER_SITE_OTHER): Payer: BC Managed Care – PPO

## 2012-08-09 ENCOUNTER — Ambulatory Visit (INDEPENDENT_AMBULATORY_CARE_PROVIDER_SITE_OTHER): Payer: BC Managed Care – PPO | Admitting: Internal Medicine

## 2012-08-09 ENCOUNTER — Other Ambulatory Visit (INDEPENDENT_AMBULATORY_CARE_PROVIDER_SITE_OTHER): Payer: BC Managed Care – PPO

## 2012-08-09 VITALS — BP 120/52 | HR 40 | Temp 97.8°F | Wt 179.4 lb

## 2012-08-09 DIAGNOSIS — E119 Type 2 diabetes mellitus without complications: Secondary | ICD-10-CM

## 2012-08-09 DIAGNOSIS — I5022 Chronic systolic (congestive) heart failure: Secondary | ICD-10-CM

## 2012-08-09 DIAGNOSIS — I10 Essential (primary) hypertension: Secondary | ICD-10-CM

## 2012-08-09 DIAGNOSIS — I509 Heart failure, unspecified: Secondary | ICD-10-CM

## 2012-08-09 DIAGNOSIS — E7800 Pure hypercholesterolemia, unspecified: Secondary | ICD-10-CM

## 2012-08-09 LAB — MICROALBUMIN / CREATININE URINE RATIO
Creatinine,U: 348.4 mg/dL
Microalb Creat Ratio: 0.1 mg/g (ref 0.0–30.0)
Microalb, Ur: 0.5 mg/dL (ref 0.0–1.9)

## 2012-08-09 LAB — BASIC METABOLIC PANEL
BUN: 19 mg/dL (ref 6–23)
CO2: 24 mEq/L (ref 19–32)
Calcium: 9.4 mg/dL (ref 8.4–10.5)
Chloride: 109 mEq/L (ref 96–112)
Creatinine, Ser: 1.7 mg/dL — ABNORMAL HIGH (ref 0.4–1.5)
GFR: 45.09 mL/min — ABNORMAL LOW (ref >60.00)
Glucose, Bld: 84 mg/dL (ref 70–99)
Potassium: 3.6 mEq/L (ref 3.5–5.1)
Sodium: 139 mEq/L (ref 135–145)

## 2012-08-09 LAB — LIPID PANEL
Cholesterol: 155 mg/dL (ref 0–200)
HDL: 39.4 mg/dL (ref >39.00)
LDL Cholesterol: 99 mg/dL (ref 0–99)
Total CHOL/HDL Ratio: 4
Triglycerides: 81 mg/dL (ref 0.0–149.0)
VLDL: 16.2 mg/dL (ref 0.0–40.0)

## 2012-08-09 NOTE — Progress Notes (Signed)
Subjective:    Patient ID: [name], male    DOB: [birth year], 57 y.o.   MRN: [id number]  HPI  Here for followup - last office visit 07/2011  Hospitalized July 4 through July 7, discharge summary reviewed in EMR PCP: Rene Paci, MD  Admit date: 07/30/2012 Discharge date: 08/02/2012  Time spent: >30 minutes  Recommendations for Outpatient Follow-up:   1. Please reassess BP and adjust medications as needed 2. Follow BMET to assess renal function and electrolytes 3. Follow up with heart failure clinic 4. Follow blood sugar regimen now that he is not on metformin and decide dose adjustment or extra hypoglycemic agent base on his CBG levels  Discharge Diagnoses:   Acute on chronic systolic congestive heart failure (EF 20-25%) DIABETES MELLITUS, TYPE II HYPERCHOLESTEROLEMIA Non compliance w medication regimen HTN (hypertension), benign Acute on chronic renal failure   medical care historically complicated by noncompliance - frequent ER visits due to same including today - reports improved med compliance in past 2 weeks -"time to take care of me"  Reviewed chronic medical issues today:  DM2 - remotely prescribed insulin (lantus, 70/30), changed to oral medications March 2011, metformin stopped sue to CKD - OSA noncompliance complicated by cost of medication-  history of [facility] August 2009- denies abdominal pain, nausea and vomiting or weight changes - denies polyuria or thirst - no blurred vision - does not check cbgs -    HTN, malignant - hosp 01/2011 and 07/2012 related to uncontrolled BP with CHF - frequent med noncompliance- denies headache, chest pain - no feet swelling or vision changes -     NICM with hx CHF - exacerbation 01/2011 and 07/2012 hospitalization for same related to malignant HTN - follows with cards intermittently for same- no edema, chest pain, but increasing shortness of breath - reports he follows "no salt" diet very carefully but noncompliant with med rx  as above  Past Medical History  Diagnosis Date  . Asthma   . CAD     nonobst cath 2004  . CARDIOMYOPATHY     NICM, LVEF 30% 01/2011 echo; 20-25% 07/2012 echo  . DIABETES MELLITUS, TYPE II   . HYPERCHOLESTEROLEMIA   . HYPERTENSION   . PSA, INCREASED   . Noncompliance   . CHF (congestive heart failure)      Review of Systems  Constitutional: Negative for fever and fatigue.  Eyes: Negative for visual disturbance.  Respiratory: Negative for cough and shortness of breath.   Cardiovascular: Negative for chest pain, palpitations and leg swelling.  Neurological: Negative for dizziness and headaches.       Objective:   Physical Exam  BP 120/52  Pulse 40  Temp(Src) 97.8 F (36.6 C) (Oral)  Wt 179 lb 6.4 oz (81.375 kg)  BMI 29.85 kg/m2  SpO2 97% Wt Readings from Last 3 Encounters:  08/09/12 179 lb 6.4 oz (81.375 kg)  08/08/12 180 lb (81.647 kg)  08/06/12 182 lb 6.4 oz (82.736 kg)   Constitutional:  He appears well-developed and well-nourished. No distress.  Neck: Normal range of motion. Neck supple. No JVD present. No thyromegaly present.  Cardiovascular: Normal rate, regular rhythm and normal heart sounds.  No murmur heard. no BLE edema Pulmonary/Chest: Effort normal and breath sounds normal. No respiratory distress. no wheezes. Skin: Skin is warm and dry.  No erythema or ulceration.  Psychiatric: he has an anxious mood and affect. behavior is normal. Judgment and thought content normal.   Lab Results  Component Value Date  WBC 8.2 07/31/2012   HGB 11.8* 07/31/2012   HCT 35.4* 07/31/2012   PLT 290 07/31/2012   GLUCOSE 107* 08/02/2012   CHOL 210* 04/25/2009   TRIG 91.0 04/25/2009   HDL 44.90 04/25/2009   LDLDIRECT 148.8 04/25/2009   LDLCALC 104 09/20/2007   ALT 22 07/31/2012   AST 19 07/31/2012   NA 136 08/02/2012   K 3.9 08/02/2012   CL 102 08/02/2012   CREATININE 1.55* 08/02/2012   BUN 24* 08/02/2012   CO2 26 08/02/2012   TSH 0.928 07/30/2012   PSA 7.80* 04/25/2009   INR 1.05 05/03/2009    HGBA1C 6.2* 07/30/2012       Assessment & Plan:  See problem list. Medications and labs reviewed today.  Time spent with pt today 25 minutes, greater than 50% time spent counseling patient on importance of continued compliance and review EMR visits to ER and hosp 07/2012

## 2012-08-09 NOTE — Assessment & Plan Note (Signed)
Reminded of need for statin compliance -  Will re-rx atorvastatin if LDL >100

## 2012-08-09 NOTE — Assessment & Plan Note (Signed)
Uncontrolled, titrate beta blocker now BP Readings from Last 3 Encounters:  08/09/12 120/52  08/08/12 131/87  08/06/12 150/94   Reviewed relationship between uncontrolled blood pressure with CHF exacerbation and progressive renal disease Encourage compliance with medications

## 2012-08-09 NOTE — Assessment & Plan Note (Signed)
2D echo 07/2012: 20-25% Working with CHF clinic on same euvolemic on exam today - medications reviewed Planning Holter and optimizing meds - recheck Echo planned and consider resuming spiro/acei when kidney fx stable

## 2012-08-09 NOTE — ED Provider Notes (Signed)
  Medical screening examination/treatment/procedure(s) were performed by non-physician practitioner and as supervising physician I was immediately available for consultation/collaboration.    [name], MD 08/09/12 0005

## 2012-08-09 NOTE — Patient Instructions (Signed)
It was good to see you today. We have reviewed your ER and hospital records including labs and tests today Test(s) ordered today. Your results will be released to MyChart (or called to you) after review, usually within 72hours after test completion. If any changes need to be made, you will be notified at that same time. Medications reviewed, no dose changes - Refill on medication(s) as discussed today.  Continue working with your heart specialists as ongoing Please schedule followup in 3 months for blood pressure and diabetes mellitus check, call sooner if problems.

## 2012-08-09 NOTE — Telephone Encounter (Signed)
48 hr holter monitor placed on Pt 08/09/12 TK 

## 2012-08-09 NOTE — Assessment & Plan Note (Signed)
Variable medication compliance reviewed - encouraged improved efforts at compliance Lab Results  Component Value Date   HGBA1C 6.2* 07/30/2012  on OSA only Off metformin, and ARB due to ARF 07/2012 with CKD - recheck now ? statin compliance -will plan to resume same

## 2012-08-10 ENCOUNTER — Telehealth: Payer: Self-pay | Admitting: *Deleted

## 2012-08-10 NOTE — Telephone Encounter (Signed)
[facility] Triage Call Report Triage Record Num: [id number] Operator: Lyn Hollingshead Patient Name: [name] Call Date & Time: 08/08/2012 9:21:22PM Patient Phone: [phone] PCP: Rene Paci Patient Gender: Male PCP Fax : [phone] Patient DOB: Nov 15, 1955 Practice Name: [facility] Reason for Call: Caller: [name]/Patient; PCP: Rene Paci (Adults only); CB#: [phone]; Call regarding High B/P 216/104; Onset-08/08/12 Client took his blood pressure about 10 mins ago and it is 216/104. He c/o of a headache that is about a 9 on pain scale. Emergent s/s of Hypertension protocol states see provider within 4hrs. Client's son is there now to take him to ED. Protocol(s) Used: Hypertension, Diagnosed or Suspected Recommended Outcome per Protocol: See Provider within 4 hours Reason for Outcome: Systolic blood pressure of more than 180 mmHg OR diastolic blood pressure of more than 120 mmHg Care Advice: ~ Another adult should drive. Call EMS 911 if new symptoms develop, such as severe shortness of breath, chest pain, change in mental status, acute neurologic deficit, seizure, visual disturbances, pulse rate > 120 / minute, or very irregular pulse. ~ ~ Call provider if symptoms worsen or new symptoms develop. Medication Advice: - Discontinue all nonprescription and alternative medications, especially stimulants, until evaluated by provider. - Take prescribed medications as directed, following label instructions for the medication. - Do not change medications or dosing regimen until provider is consulted. - Know possible side effects of medication and what to do if they occur. - Tell provider all prescription, nonprescription or alternative medications that you take ~ 08/08/2012 9:39:06PM Page 1 of 1 [id number]

## 2012-08-11 ENCOUNTER — Telehealth: Payer: Self-pay | Admitting: *Deleted

## 2012-08-11 NOTE — Telephone Encounter (Signed)
Noted patient request to be discharged from home health services. Appreciate the information. Thank you, no other recommendations at this time

## 2012-08-11 NOTE — Telephone Encounter (Signed)
Left msg on vm stating pt was referred to them when he was d/c from hosp. Pt has to pay $45 for visits, and pt states he is having some financial difficulties & not able to afford. Did go over pt meds yesterday which he is currently taking, inform pt need to check weight everyday. Wanted to let md know that due to pt request we will be discharging...Raechel Chute

## 2012-08-25 ENCOUNTER — Encounter (HOSPITAL_COMMUNITY): Payer: Self-pay

## 2012-08-25 ENCOUNTER — Ambulatory Visit (HOSPITAL_COMMUNITY)
Admission: RE | Admit: 2012-08-25 | Discharge: 2012-08-25 | Disposition: A | Discharge status: Home / Self Care | Payer: BC Managed Care – PPO | Source: Ambulatory Visit | Attending: Internal Medicine | Admitting: Internal Medicine

## 2012-08-25 VITALS — BP 141/71 | HR 52 | Wt 182.0 lb

## 2012-08-25 DIAGNOSIS — R0989 Other specified symptoms and signs involving the circulatory and respiratory systems: Secondary | ICD-10-CM | POA: Insufficient documentation

## 2012-08-25 DIAGNOSIS — E7800 Pure hypercholesterolemia, unspecified: Secondary | ICD-10-CM | POA: Insufficient documentation

## 2012-08-25 DIAGNOSIS — I5022 Chronic systolic (congestive) heart failure: Secondary | ICD-10-CM

## 2012-08-25 DIAGNOSIS — G4733 Obstructive sleep apnea (adult) (pediatric): Secondary | ICD-10-CM | POA: Insufficient documentation

## 2012-08-25 DIAGNOSIS — I10 Essential (primary) hypertension: Secondary | ICD-10-CM | POA: Insufficient documentation

## 2012-08-25 DIAGNOSIS — I2510 Atherosclerotic heart disease of native coronary artery without angina pectoris: Secondary | ICD-10-CM | POA: Insufficient documentation

## 2012-08-25 DIAGNOSIS — R0609 Other forms of dyspnea: Secondary | ICD-10-CM | POA: Insufficient documentation

## 2012-08-25 DIAGNOSIS — E119 Type 2 diabetes mellitus without complications: Secondary | ICD-10-CM | POA: Insufficient documentation

## 2012-08-25 DIAGNOSIS — I129 Hypertensive chronic kidney disease with stage 1 through stage 4 chronic kidney disease, or unspecified chronic kidney disease: Secondary | ICD-10-CM | POA: Insufficient documentation

## 2012-08-25 DIAGNOSIS — R0683 Snoring: Secondary | ICD-10-CM

## 2012-08-25 DIAGNOSIS — I428 Other cardiomyopathies: Secondary | ICD-10-CM | POA: Insufficient documentation

## 2012-08-25 DIAGNOSIS — Z79899 Other long term (current) drug therapy: Secondary | ICD-10-CM | POA: Insufficient documentation

## 2012-08-25 DIAGNOSIS — J45909 Unspecified asthma, uncomplicated: Secondary | ICD-10-CM | POA: Insufficient documentation

## 2012-08-25 DIAGNOSIS — I509 Heart failure, unspecified: Secondary | ICD-10-CM | POA: Insufficient documentation

## 2012-08-25 DIAGNOSIS — N182 Chronic kidney disease, stage 2 (mild): Secondary | ICD-10-CM | POA: Insufficient documentation

## 2012-08-25 MED ORDER — HYDRALAZINE HCL 25 MG PO TABS: 37.5000 mg | ORAL_TABLET | Freq: Three times a day (TID) | ORAL | Status: DC

## 2012-08-25 NOTE — Patient Instructions (Addendum)
Take hydralazine 37.5 mg three times a day which is 1 1/2 tablets  Take an extra 40 mg of lasix if your weight is 183 pounds or great  You have been referred to Pulmonary for the evaluation of snoring. (appt scheduled)  Do the following things EVERYDAY: 1) Weigh yourself in the morning before breakfast. Write it down and keep it in a log. 2) Take your medicines as prescribed 3) Eat low salt foods-Limit salt (sodium) to 2000 mg per day.  4) Stay as active as you can everyday 5) Limit all fluids for the day to less than 2 liters  Follow up in 3-4 weeks.

## 2012-08-25 NOTE — Progress Notes (Addendum)
Patient ID: [name], male   DOB: 07/18/1955, 57 y.o.   MRN: [id number]  Advanced HF Clinic Consult  Weight Range  182 pounds  Baseline proBNP     HPI: [name] is a 57 year old with a history of HTN, PVCs, chronic systolic heart failure, last cardiac cath 2005 Nonobstructive coronary artery disease (first diagonal 25% stenosis, ramus intermedius 25% stenosis) and borderline DM2.   2009 ECHO EF 45% 2013 ECHO EF 25%   07/30/12 ECHO EF 20-25%  He has a long h/o HF as described above. Previous cath with minimal CAD. He says started feeling bad in June. He noticed increased dyspnea.   Admitted to [facility] 07/30/12 through 08/02/12 with mild acute/chronic systolic heart failure in setting on medication noncompliance. CEs negative. CKD noted 1.55. pBNP 320 (previous 1200)  He has been out of his medications 2-3 months. He diuresed 2 pounds and was discharged. Discharge weight 178 pounds. Previous spiro and ace stopped due to CRI. Bidil started.  He returns for follow up. Last visit Imdur 30 mg daily added and hydralazine 25 mg tid started. Holter monitor was also placed  For 48 hours. Results pending. Overall he is feeling better. Mild dyspnea with exertion and going up steps. + Orthopnea Sleeps on 2 pillows. Weight at home 179-180 pounds. He has not required an extra lasix.  Following low salt diet. Drinking < 2 liters per day. Compliant with medications.  Complains of day time fatigue and snoring.   08/09/12 Labs Potassium 3.6  Creatinine 1.7    Past Medical History  Diagnosis Date  . Asthma   . CAD     nonobst cath 2004  . CARDIOMYOPATHY     NICM, LVEF 30% 01/2011 echo; 20-25% 07/2012 echo  . DIABETES MELLITUS, TYPE II   . HYPERCHOLESTEROLEMIA   . HYPERTENSION   . PSA, INCREASED   . Noncompliance   . CHF (congestive heart failure)     History   Social History  . Marital Status: Legally Separated    Spouse Name: N/A    Number of Children: N/A  . Years of Education: N/A   Social  History Main Topics  . Smoking status: Never Smoker   . 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 Alcohol Use: Yes     Comment: rare  . Drug Use: No  . Sexually Active: No   Other Topics Concern  . None   Social History Narrative   Single - divorced x 3 -   Lives with 2 sons, enjoys spending time with 6 g-kids and 3 kids   Family History  Problem Relation Age of Onset  . Peripheral vascular disease    . Hypertension    . Prostate cancer    . Hypertension Mother   . Peripheral vascular disease Mother   . Prostate cancer Brother      Current Outpatient Prescriptions  Medication Sig Dispense Refill  . aspirin EC 81 MG tablet Take 81 mg by mouth daily.      . carvedilol (COREG) 12.5 MG tablet Take 1 tablet (12.5 mg total) by mouth 2 (two) times daily with a meal.  60 tablet  5  . furosemide (LASIX) 40 MG tablet Take 40 mg by mouth daily.      [name] glimepiride (AMARYL) 2 MG tablet Take 1 tablet (2 mg total) by mouth daily before breakfast.  30 tablet  11  .  hydrALAZINE (APRESOLINE) 25 MG tablet Take 1.5 tablets (37.5 mg total) by mouth 3 (three) times daily.  135 tablet  3  . isosorbide mononitrate (IMDUR) 30 MG 24 hr tablet Take 1 tablet (30 mg total) by mouth daily.  30 tablet  3   No current facility-administered medications for this encounter.     PHYSICAL EXAM: Filed Vitals:   08/25/12 0909  BP: 141/71  Pulse: 52  Weight: 182 lb (82.555 kg)  SpO2: 99%    General:  Well appearing. No resp difficulty HEENT: normal Neck: supple. JVP flat. Carotids 2+ bilaterally; no bruits. No lymphadenopathy or thryomegaly appreciated. Cor: PMI normal. Regular rate & rhythm. No rubs, gallops or murmurs. Lungs: clear Abdomen: soft, nontender, nondistended. No hepatosplenomegaly. No bruits or masses. Good bowel sounds. Extremities: no cyanosis, clubbing, rash, edema Neuro: alert & orientedx3, cranial nerves grossly intact.  Moves all 4 extremities w/o difficulty. Affect pleasant.     ASSESSMENT & PLAN:  1. Chronic Systolic Heart Failure- NYHA II-III. ECHO EF 20-25% 07/30/12 Volume status stable. Continue lasix 40 mg twice a day Will not increase carvedilol due to fatigue. Continue Carvedilol 12.5 mg twice a day Increase hydralazine to 50 mg three times a day/ IMDUR 30 mg daily Holter monitor results pending if frequent PVCs will need to start amiodarone to reduce PVC burden and hopefully improve EF.  He is not on an Ace-I or spironolactone due CKD.   Reinforced daily weights, medication compliance, low salt food choices, and limiting fluids to < 2 liters per day.   2. HTN  BP improving. Increase hydralazine as noted above.   3. Snores Refer to pulmonary for sleep study  4. CKD  Reviewed most recent BMET. Creatinine 1.7 He will remain off Ace-I and Spironolactone.    Follow up in 4 weeks

## 2012-08-26 ENCOUNTER — Other Ambulatory Visit (HOSPITAL_COMMUNITY): Payer: Self-pay | Admitting: *Deleted

## 2012-08-26 DIAGNOSIS — I493 Ventricular premature depolarization: Secondary | ICD-10-CM

## 2012-08-26 NOTE — Patient Instructions (Signed)
Left message for pt that he is being referred to EP for the evaluation and treatment of PVC's (Ablation vs amio)per Dr Gala Romney.  Requested he call back with questions.

## 2012-09-03 ENCOUNTER — Other Ambulatory Visit: Payer: Self-pay | Admitting: *Deleted

## 2012-09-03 MED ORDER — GLIMEPIRIDE 2 MG PO TABS: 2.0000 mg | ORAL_TABLET | Freq: Every day | ORAL | Status: DC

## 2012-09-03 MED ORDER — FUROSEMIDE 40 MG PO TABS: 40.0000 mg | ORAL_TABLET | Freq: Every day | ORAL | Status: DC

## 2012-09-03 MED ORDER — CARVEDILOL 12.5 MG PO TABS: 12.5000 mg | ORAL_TABLET | Freq: Two times a day (BID) | ORAL | Status: DC

## 2012-09-07 ENCOUNTER — Telehealth (HOSPITAL_COMMUNITY): Payer: Self-pay | Admitting: *Deleted

## 2012-09-07 DIAGNOSIS — I493 Ventricular premature depolarization: Secondary | ICD-10-CM

## 2012-09-07 NOTE — Telephone Encounter (Signed)
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 PVC ablation vs amio, pt is aware and is sch to see Dr Johney Frame 9/22 at 2:15

## 2012-09-15 ENCOUNTER — Encounter (HOSPITAL_COMMUNITY): Payer: Self-pay

## 2012-09-15 ENCOUNTER — Ambulatory Visit (HOSPITAL_COMMUNITY)
Admission: RE | Admit: 2012-09-15 | Discharge: 2012-09-15 | Disposition: A | Discharge status: Home / Self Care | Payer: BC Managed Care – PPO | Source: Ambulatory Visit | Attending: Internal Medicine | Admitting: Internal Medicine

## 2012-09-15 VITALS — BP 134/90 | HR 76 | Ht 65.0 in | Wt 185.4 lb

## 2012-09-15 DIAGNOSIS — Z9119 Patient's noncompliance with other medical treatment and regimen: Secondary | ICD-10-CM | POA: Insufficient documentation

## 2012-09-15 DIAGNOSIS — I5022 Chronic systolic (congestive) heart failure: Secondary | ICD-10-CM

## 2012-09-15 DIAGNOSIS — I129 Hypertensive chronic kidney disease with stage 1 through stage 4 chronic kidney disease, or unspecified chronic kidney disease: Secondary | ICD-10-CM | POA: Insufficient documentation

## 2012-09-15 DIAGNOSIS — R0683 Snoring: Secondary | ICD-10-CM

## 2012-09-15 DIAGNOSIS — N189 Chronic kidney disease, unspecified: Secondary | ICD-10-CM | POA: Insufficient documentation

## 2012-09-15 DIAGNOSIS — Z91199 Patient's noncompliance with other medical treatment and regimen due to unspecified reason: Secondary | ICD-10-CM | POA: Insufficient documentation

## 2012-09-15 DIAGNOSIS — R0609 Other forms of dyspnea: Secondary | ICD-10-CM

## 2012-09-15 DIAGNOSIS — I2510 Atherosclerotic heart disease of native coronary artery without angina pectoris: Secondary | ICD-10-CM | POA: Insufficient documentation

## 2012-09-15 DIAGNOSIS — E119 Type 2 diabetes mellitus without complications: Secondary | ICD-10-CM | POA: Insufficient documentation

## 2012-09-15 DIAGNOSIS — I10 Essential (primary) hypertension: Secondary | ICD-10-CM

## 2012-09-15 DIAGNOSIS — I428 Other cardiomyopathies: Secondary | ICD-10-CM | POA: Insufficient documentation

## 2012-09-15 DIAGNOSIS — Z79899 Other long term (current) drug therapy: Secondary | ICD-10-CM | POA: Insufficient documentation

## 2012-09-15 DIAGNOSIS — R0989 Other specified symptoms and signs involving the circulatory and respiratory systems: Secondary | ICD-10-CM

## 2012-09-15 DIAGNOSIS — N182 Chronic kidney disease, stage 2 (mild): Secondary | ICD-10-CM

## 2012-09-15 DIAGNOSIS — J45909 Unspecified asthma, uncomplicated: Secondary | ICD-10-CM | POA: Insufficient documentation

## 2012-09-15 DIAGNOSIS — I509 Heart failure, unspecified: Secondary | ICD-10-CM | POA: Insufficient documentation

## 2012-09-15 DIAGNOSIS — Z7982 Long term (current) use of aspirin: Secondary | ICD-10-CM | POA: Insufficient documentation

## 2012-09-15 LAB — BASIC METABOLIC PANEL
BUN: 20 mg/dL (ref 6–23)
CO2: 23 mEq/L (ref 19–32)
Calcium: 9.4 mg/dL (ref 8.4–10.5)
Chloride: 106 mEq/L (ref 96–112)
Creatinine, Ser: 1.51 mg/dL — ABNORMAL HIGH (ref 0.50–1.35)
GFR calc Af Amer: 57 mL/min — ABNORMAL LOW (ref >90)
GFR calc non Af Amer: 49 mL/min — ABNORMAL LOW (ref >90)
Glucose, Bld: 106 mg/dL — ABNORMAL HIGH (ref 70–99)
Potassium: 3.7 mEq/L (ref 3.5–5.1)
Sodium: 141 mEq/L (ref 135–145)

## 2012-09-15 LAB — PRO B NATRIURETIC PEPTIDE: Pro B Natriuretic peptide (BNP): 604.9 pg/mL — ABNORMAL HIGH (ref 0–125)

## 2012-09-15 MED ORDER — CARVEDILOL 12.5 MG PO TABS: 6.2500 mg | ORAL_TABLET | Freq: Two times a day (BID) | ORAL | Status: DC

## 2012-09-15 NOTE — Patient Instructions (Addendum)
Follow up 1 month  Take carvedilol 6.25 mg twice a day which is 1/2 tablet  Do the following things EVERYDAY: 1) Weigh yourself in the morning before breakfast. Write it down and keep it in a log. 2) Take your medicines as prescribed 3) Eat low salt foods-Limit salt (sodium) to 2000 mg per day.  4) Stay as active as you can everyday 5) Limit all fluids for the day to less than 2 liters

## 2012-09-15 NOTE — Progress Notes (Signed)
Patient ID: [name], male   DOB: Aug 16, 1955, 57 y.o.   MRN: [id number]  Advanced HF Clinic Consult  Weight Range  182 pounds  Baseline proBNP    PCP: Dr [name] HPI: [name] is a 57 year old with a history of HTN, PVCs, chronic systolic heart failure, last cardiac cath 2005 Nonobstructive coronary artery disease (first diagonal 25% stenosis, ramus intermedius 25% stenosis) and borderline DM2.   2009 ECHO EF 45% 2013 ECHO EF 25%   07/30/12 ECHO EF 20-25%   Admitted to [facility] 07/30/12 through 08/02/12 with mild acute/chronic systolic heart failure in setting on medication noncompliance. CEs negative. CKD noted 1.55. pBNP 320 (previous 1200)  He has been out of his medications 2-3 months. He diuresed 2 pounds and was discharged. Discharge weight 178 pounds. Previous spiro and ace stopped due to CRI. Bidil started.  08/25/12 48 hour Holter Monitor- 29% Monomorphic  PVCs referred to Dr Johney Frame   He returns for follow up. Last visit hydralazine increased to 50 mg tid. Complains of ongoing fatigue.  Complains of dyspnea with 2 steps and needs frequent breaks going to the grocery store. + Orthopnea Sleeps on 2 pillows. Weight at home 180-182 pounds. He has not required any extra lasix.  Following low salt diet. Drinking < 2 liters per day. Compliant with medications.  He will have an evaluation by pulmonologist next week.   08/09/12 Potassium 3.6 Creatinine 1.7    Past Medical History  Diagnosis Date  . Asthma   . CAD     nonobst cath 2004  . CARDIOMYOPATHY     NICM, LVEF 30% 01/2011 echo; 20-25% 07/2012 echo  . DIABETES MELLITUS, TYPE II   . HYPERCHOLESTEROLEMIA   . HYPERTENSION   . PSA, INCREASED   . Noncompliance   . CHF (congestive heart failure)     History   Social History  . Marital Status: Legally Separated    Spouse Name: N/A    Number of Children: N/A  . Years of Education: N/A   Social History Main Topics  . Smoking status: Never Smoker   . 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 Alcohol Use: Yes     Comment: rare  . Drug Use: No  . Sexual Activity: No   Other Topics Concern  . None   Social History Narrative   Single - divorced x 3 -   Lives with 2 sons, enjoys spending time with 6 g-kids and 3 kids   Family History  Problem Relation Age of Onset  . Peripheral vascular disease    . Hypertension    . Prostate cancer    . Hypertension Mother   . Peripheral vascular disease Mother   . Prostate cancer Brother      Current Outpatient Prescriptions  Medication Sig Dispense Refill  . aspirin EC 81 MG tablet Take 81 mg by mouth daily.      . carvedilol (COREG) 12.5 MG tablet Take 0.5 tablets (6.25 mg total) by mouth 2 (two) times daily with a meal.  30 tablet  5  . furosemide (LASIX) 40 MG tablet Take 1 tablet (40 mg total) by mouth daily.  30 tablet  6  . glimepiride (AMARYL) 2 MG tablet Take 1 tablet (2 mg total) by mouth daily before breakfast.  30 tablet  6  . hydrALAZINE (APRESOLINE) 25 MG tablet Take 1.5 tablets (37.5 mg total) by mouth 3 (  three) times daily.  135 tablet  3  . isosorbide mononitrate (IMDUR) 30 MG 24 hr tablet Take 1 tablet (30 mg total) by mouth daily.  30 tablet  3   No current facility-administered medications for this encounter.     PHYSICAL EXAM: Filed Vitals:   09/15/12 0900  BP: 134/90  Pulse: 76  Height: 5\' 5"  (1.651 m)  Weight: 185 lb 6.4 oz (84.097 kg)  SpO2: 99%    General:  Well appearing. No resp difficulty HEENT: normal Neck: supple. JVP flat. Carotids 2+ bilaterally; no bruits. No lymphadenopathy or thryomegaly appreciated. Cor: PMI normal. Regular rate & rhythm. No rubs, gallops or murmurs. Lungs: clear Abdomen: soft, nontender, nondistended. No hepatosplenomegaly. No bruits or masses. Good bowel sounds. Extremities: no cyanosis, clubbing, rash, edema Neuro: alert & orientedx3, cranial nerves grossly intact. Moves all 4 extremities  w/o difficulty. Affect pleasant.  EKG: SR frequent PVCs 76     ASSESSMENT & PLAN: 1. Chronic Systolic Heart Failure- NICM-cardiac cath 2005 Nonobstructive coronary artery disease (first diagonal 25% stenosis, ramus intermedius 25% stenosis)  07/30/12 ECHO EF 20-25% 07/30/12. NYHA IIIB. Dyspnea with exertion. Functional decline noted.  Volume status stable. Continue lasix 40 mg twice a day Cut back back Carvedilol to 6.25 mg twice a day due to profound fatigue and HR on arrival 40.  EKG frequent PVCs.  Continue hydralazine to 50 mg three times a day/ IMDUR 30 mg daily. 08/25/12 48 hour Holter Monitor- 29% Monomorphic  PVCs referred to Dr Johney Frame 10/18/12 ? Ablation.  He is not on an Ace-I or spironolactone due to CKD.  Reinforced daily weights, medication compliance, low salt food choices, and limiting fluids to < 2 liters per day.  Check BMET Pro BNP  2. HTN  BP improving. Continue hydralazine 50 mg tid/IMDUR 30 mg daily   3. Snores Pulmonary evaluation pending for sleep study next week.   4. CKD  Check BMET. He will remain off Ace-I and Spironolactone due to CKD    Follow up in 4 weeks  CLEGG,AMYNP-C 3:52 PM

## 2012-09-16 NOTE — Addendum Note (Signed)
Encounter addended by: Ernestina Penna on: 09/16/2012  8:01 AM<BR>     Documentation filed: Charges VN

## 2012-09-22 ENCOUNTER — Ambulatory Visit (INDEPENDENT_AMBULATORY_CARE_PROVIDER_SITE_OTHER): Payer: BC Managed Care – PPO | Admitting: Internal Medicine

## 2012-09-22 ENCOUNTER — Encounter: Payer: Self-pay | Admitting: Internal Medicine

## 2012-09-22 VITALS — BP 152/92 | HR 71 | Ht 65.0 in | Wt 182.6 lb

## 2012-09-22 DIAGNOSIS — I493 Ventricular premature depolarization: Secondary | ICD-10-CM

## 2012-09-22 DIAGNOSIS — Z7901 Long term (current) use of anticoagulants: Secondary | ICD-10-CM

## 2012-09-22 DIAGNOSIS — I10 Essential (primary) hypertension: Secondary | ICD-10-CM

## 2012-09-22 DIAGNOSIS — I428 Other cardiomyopathies: Secondary | ICD-10-CM

## 2012-09-22 DIAGNOSIS — Z01810 Encounter for preprocedural cardiovascular examination: Secondary | ICD-10-CM

## 2012-09-22 DIAGNOSIS — I4949 Other premature depolarization: Secondary | ICD-10-CM

## 2012-09-22 DIAGNOSIS — I5022 Chronic systolic (congestive) heart failure: Secondary | ICD-10-CM

## 2012-09-22 LAB — CBC WITH DIFFERENTIAL/PLATELET
Basophils Absolute: 0.1 10*3/uL (ref 0.0–0.1)
Basophils Relative: 0.8 % (ref 0.0–3.0)
Eosinophils Absolute: 0.1 10*3/uL (ref 0.0–0.7)
Eosinophils Relative: 1.3 % (ref 0.0–5.0)
HCT: 38 % — ABNORMAL LOW (ref 39.0–52.0)
Hemoglobin: 12.8 g/dL — ABNORMAL LOW (ref 13.0–17.0)
Lymphocytes Relative: 30 % (ref 12.0–46.0)
Lymphs Abs: 2.1 10*3/uL (ref 0.7–4.0)
MCHC: 33.7 g/dL (ref 30.0–36.0)
MCV: 87.4 fl (ref 78.0–100.0)
Monocytes Absolute: 0.7 10*3/uL (ref 0.1–1.0)
Monocytes Relative: 10.2 % (ref 3.0–12.0)
Neutro Abs: 4.1 10*3/uL (ref 1.4–7.7)
Neutrophils Relative %: 57.7 % (ref 43.0–77.0)
Platelets: 285 10*3/uL (ref 150.0–400.0)
RBC: 4.34 Mil/uL (ref 4.22–5.81)
RDW: 13.6 % (ref 11.5–14.6)
WBC: 7.1 10*3/uL (ref 4.5–10.5)

## 2012-09-22 LAB — BASIC METABOLIC PANEL
BUN: 21 mg/dL (ref 6–23)
CO2: 26 mEq/L (ref 19–32)
Calcium: 9 mg/dL (ref 8.4–10.5)
Chloride: 109 mEq/L (ref 96–112)
Creatinine, Ser: 1.5 mg/dL (ref 0.4–1.5)
GFR: 49.49 mL/min — ABNORMAL LOW (ref >60.00)
Glucose, Bld: 98 mg/dL (ref 70–99)
Potassium: 3.5 mEq/L (ref 3.5–5.1)
Sodium: 139 mEq/L (ref 135–145)

## 2012-09-22 LAB — PROTIME-INR
INR: 1.1 ratio — ABNORMAL HIGH (ref 0.8–1.0)
Prothrombin Time: 12.1 s (ref 10.2–12.4)

## 2012-09-22 NOTE — Patient Instructions (Addendum)

## 2012-09-23 NOTE — Progress Notes (Signed)
 Primary Care Physician: Valerie Leschber, MD Referring Physician:  Dr Bensimhon   [name] is a 57 y.o. male with a h/o nonischemic cardiomyopathy and very frequent PVCs who presents today for EP consultation.  The patient has had a nonischemic cardmiomyopathy for several years.  He is followed by Dr Bensimhon in the advanced heart failure clinic.  He is noted to have very frequent PVCs.  A recent 48 hour holter monitor 7/14 documented 62k (28%) PVCs.  His PVCs are felt to be contributing to low EF/ symptoms of fatigue and decreased exercise tolerance.  He also reports occasional "fluttering" in his chest.  He is referred for EP consultation.  Today, he denies symptoms of chest pain,  orthopnea, PND, lower extremity edema, dizziness, presyncope, syncope, or neurologic sequela. The patient is tolerating medications without difficulties and is otherwise without complaint today.   Past Medical History  Diagnosis Date  . Asthma   . CAD     nonobst cath 2004  . CARDIOMYOPATHY     NICM, LVEF 30% 01/2011 echo; 20-25% 07/2012 echo  . DIABETES MELLITUS, TYPE II   . HYPERCHOLESTEROLEMIA   . HYPERTENSION   . PSA, INCREASED   . Noncompliance   . CHF (congestive heart failure)    Past Surgical History  Procedure Laterality Date  . Cardiac catheterization  2004    nonobst dz  . Surgery scrotal / testicular      at age 14    Current Outpatient Prescriptions  Medication Sig Dispense Refill  . aspirin EC 81 MG tablet Take 81 mg by mouth daily.      . carvedilol (COREG) 12.5 MG tablet Take 0.5 tablets (6.25 mg total) by mouth 2 (two) times daily with a meal.  30 tablet  5  . furosemide (LASIX) 40 MG tablet Take 1 tablet (40 mg total) by mouth daily.  30 tablet  6  . glimepiride (AMARYL) 2 MG tablet Take 1 tablet (2 mg total) by mouth daily before breakfast.  30 tablet  6  . hydrALAZINE (APRESOLINE) 25 MG tablet Take 1.5 tablets (37.5 mg total) by mouth 3 (three) times daily.  135 tablet   3  . isosorbide mononitrate (IMDUR) 30 MG 24 hr tablet Take 1 tablet (30 mg total) by mouth daily.  30 tablet  3   No current facility-administered medications for this visit.    Allergies  Allergen Reactions  . Ambien [Zolpidem Tartrate] Other (See Comments)    "Went berserk."    History   Social History  . Marital Status: Single    Spouse Name: N/A    Number of Children: N/A  . Years of Education: N/A   Occupational History  . Not on file.   Social History Main Topics  . Smoking status: Never Smoker   . 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 Alcohol Use: Yes     Comment: rare  . Drug Use: No  . Sexual Activity: No   Other Topics Concern  . Not on file   Social History Narrative   Single - divorced x 3 -   Lives with 2 sons, enjoys spending time with 6 g-kids and 3 kids    Family History  Problem Relation Age of Onset  . Peripheral vascular disease    . Hypertension    . Prostate cancer    . Hypertension Mother   . Peripheral vascular disease   Mother   . Prostate cancer Brother     ROS- All systems are reviewed and negative except as per the HPI above  Physical Exam: Filed Vitals:   09/22/12 0825  BP: 152/92  Pulse: 71  Height: 5' 5" (1.651 m)  Weight: 182 lb 9.6 oz (82.827 kg)    GEN- The patient is well appearing, alert and oriented x 3 today.   Head- normocephalic, atraumatic Eyes-  Sclera clear, conjunctiva pink Ears- hearing intact Oropharynx- clear Neck- supple, no JVP Lymph- no cervical lymphadenopathy Lungs- Clear to ausculation bilaterally, normal work of breathing Heart- Regular rate and rhythm, no murmurs, rubs or gallops, PMI not laterally displaced GI- soft, NT, ND, + BS Extremities- no clubbing, cyanosis, or edema MS- no significant deformity or atrophy Skin- no rash or lesion Psych- euthymic mood, full affect Neuro- strength and sensation are intact  EKG 09/15/12  reveals sinus with bigeminal PVCs (LBB inferior axis) Echo reviewed Dr Bensimhons notes are reviewed  Assessment and Plan:  1. PVCs The patient has a very high burden of PVCs.  These appear to be likely outflow tract in origin.  He has failed medical therapy with beta blockers and is referred for EP consultation.  I agree with Dr Bensimhon that given >60k PVCs in 48 hours, that these are likely contributing to his symptoms of fatigue and decreased exercise tolerance.  His depressed EF may also be due to PVCs. Therapeutic strategies for PVCs including medicine and ablation were discussed in detail with the patient today. Risk, benefits, and alternatives to EP study and radiofrequency ablation were also discussed in detail today. These risks include but are not limited to stroke, bleeding, vascular damage, tamponade, perforation, damage to the heart and other structures, AV block requiring pacemaker, worsening renal function, and death. The patient understands these risk and wishes to proceed.  We will therefore proceed with catheter ablation at the next available time.  2. Nonischemic CM Proceed with ablation as above Will need to re-evaluate his EF post ablation.  If his EF remains depressed then he may need to be considered for ICD implant though I am optimistic that his EF will improve.  3. HTN Stable No change required today  

## 2012-09-24 ENCOUNTER — Other Ambulatory Visit: Payer: Self-pay | Admitting: *Deleted

## 2012-09-24 ENCOUNTER — Institutional Professional Consult (permissible substitution): Payer: BC Managed Care – PPO | Admitting: Pulmonary Disease

## 2012-09-24 ENCOUNTER — Encounter (HOSPITAL_COMMUNITY): Payer: Self-pay | Admitting: Pharmacy Technician

## 2012-09-28 ENCOUNTER — Encounter (HOSPITAL_COMMUNITY): Payer: Self-pay | Admitting: Anesthesiology

## 2012-09-28 ENCOUNTER — Ambulatory Visit (HOSPITAL_COMMUNITY): Payer: BC Managed Care – PPO | Admitting: Anesthesiology

## 2012-09-28 ENCOUNTER — Encounter (HOSPITAL_COMMUNITY)
Admission: RE | Disposition: A | Discharge status: Home / Self Care | Payer: Self-pay | Source: Ambulatory Visit | Attending: Internal Medicine

## 2012-09-28 ENCOUNTER — Ambulatory Visit (HOSPITAL_COMMUNITY)
Admission: RE | Admit: 2012-09-28 | Discharge: 2012-09-28 | Disposition: A | Discharge status: Home / Self Care | Payer: BC Managed Care – PPO | Source: Ambulatory Visit | Attending: Internal Medicine | Admitting: Internal Medicine

## 2012-09-28 DIAGNOSIS — Z79899 Other long term (current) drug therapy: Secondary | ICD-10-CM | POA: Insufficient documentation

## 2012-09-28 DIAGNOSIS — E119 Type 2 diabetes mellitus without complications: Secondary | ICD-10-CM | POA: Insufficient documentation

## 2012-09-28 DIAGNOSIS — Z7982 Long term (current) use of aspirin: Secondary | ICD-10-CM | POA: Insufficient documentation

## 2012-09-28 DIAGNOSIS — I428 Other cardiomyopathies: Secondary | ICD-10-CM | POA: Diagnosis present

## 2012-09-28 DIAGNOSIS — I4729 Other ventricular tachycardia: Secondary | ICD-10-CM | POA: Insufficient documentation

## 2012-09-28 DIAGNOSIS — R972 Elevated prostate specific antigen [PSA]: Secondary | ICD-10-CM | POA: Insufficient documentation

## 2012-09-28 DIAGNOSIS — Z9119 Patient's noncompliance with other medical treatment and regimen: Secondary | ICD-10-CM | POA: Insufficient documentation

## 2012-09-28 DIAGNOSIS — J45909 Unspecified asthma, uncomplicated: Secondary | ICD-10-CM | POA: Insufficient documentation

## 2012-09-28 DIAGNOSIS — Z6830 Body mass index (BMI) 30.0-30.9, adult: Secondary | ICD-10-CM | POA: Insufficient documentation

## 2012-09-28 DIAGNOSIS — I472 Ventricular tachycardia, unspecified: Secondary | ICD-10-CM | POA: Insufficient documentation

## 2012-09-28 DIAGNOSIS — I509 Heart failure, unspecified: Secondary | ICD-10-CM | POA: Insufficient documentation

## 2012-09-28 DIAGNOSIS — I4949 Other premature depolarization: Secondary | ICD-10-CM | POA: Diagnosis present

## 2012-09-28 DIAGNOSIS — E669 Obesity, unspecified: Secondary | ICD-10-CM | POA: Insufficient documentation

## 2012-09-28 DIAGNOSIS — Z91199 Patient's noncompliance with other medical treatment and regimen due to unspecified reason: Secondary | ICD-10-CM | POA: Insufficient documentation

## 2012-09-28 DIAGNOSIS — E7800 Pure hypercholesterolemia, unspecified: Secondary | ICD-10-CM | POA: Insufficient documentation

## 2012-09-28 DIAGNOSIS — I10 Essential (primary) hypertension: Secondary | ICD-10-CM | POA: Insufficient documentation

## 2012-09-28 DIAGNOSIS — I2510 Atherosclerotic heart disease of native coronary artery without angina pectoris: Secondary | ICD-10-CM | POA: Insufficient documentation

## 2012-09-28 DIAGNOSIS — Z538 Procedure and treatment not carried out for other reasons: Secondary | ICD-10-CM | POA: Insufficient documentation

## 2012-09-28 HISTORY — PX: SUPRAVENTRICULAR TACHYCARDIA ABLATION: SHX5492

## 2012-09-28 LAB — GLUCOSE, CAPILLARY
Glucose-Capillary: 104 mg/dL — ABNORMAL HIGH (ref 70–99)
Glucose-Capillary: 84 mg/dL (ref 70–99)

## 2012-09-28 SURGERY — SUPRAVENTRICULAR TACHYCARDIA ABLATION
Anesthesia: Monitor Anesthesia Care

## 2012-09-28 MED ORDER — LACTATED RINGERS IV SOLN
INTRAVENOUS | Status: DC | PRN
  Administered 2012-09-28: 10:00:00 via INTRAVENOUS

## 2012-09-28 MED ORDER — ISOPROTERENOL HCL 0.2 MG/ML IJ SOLN
1000.0000 ug | INTRAVENOUS | Status: DC | PRN
  Administered 2012-09-28: 2 ug via INTRAVENOUS

## 2012-09-28 MED ORDER — HYDROXYUREA 500 MG PO CAPS
ORAL_CAPSULE | ORAL | Status: AC
  Filled 2012-09-28: qty 1

## 2012-09-28 MED ORDER — BUPIVACAINE HCL (PF) 0.25 % IJ SOLN
INTRAMUSCULAR | Status: AC
  Filled 2012-09-28: qty 60

## 2012-09-28 NOTE — Anesthesia Postprocedure Evaluation (Signed)
Anesthesia Post Note  Patient: [name]  Procedure(s) Performed: Procedure(s) (LRB): VTach Ablation (N/A)  Anesthesia type: MAC  Patient location: PACU  Post pain: Pain level controlled  Post assessment: Patient's Cardiovascular Status Stable  Last Vitals:  Filed Vitals:   09/28/12 0834  BP: 163/95  Pulse: 74  Temp: 36.6 C  Resp: 18    Post vital signs: Reviewed and stable  Level of consciousness: alert  Complications: No apparent anesthesia complications

## 2012-09-28 NOTE — Addendum Note (Signed)
Addendum created 09/28/12 1504 by Coralee Rud, CRNA   Modules edited: Anesthesia Events

## 2012-09-28 NOTE — H&P (View-Only) (Signed)
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  The patient has had a nonischemic cardmiomyopathy for several years.  He is followed by Dr Gala Romney in the advanced heart failure clinic.  He is noted to have very frequent PVCs.  A recent 48 hour holter monitor 7/14 documented 62k (28%) PVCs.  His PVCs are felt to be contributing to low EF/ symptoms of fatigue and decreased exercise tolerance.  He also reports occasional "fluttering" in his chest.  He is referred for EP consultation.  Today, he denies symptoms of chest pain,  orthopnea, PND, lower extremity edema, dizziness, presyncope, syncope, or neurologic sequela. The patient is tolerating medications without difficulties and is otherwise without complaint today.   Past Medical History  Diagnosis Date  . Asthma   . CAD     nonobst cath 2004  . CARDIOMYOPATHY     NICM, LVEF 30% 01/2011 echo; 20-25% 07/2012 echo  . DIABETES MELLITUS, TYPE II   . HYPERCHOLESTEROLEMIA   . HYPERTENSION   . PSA, INCREASED   . Noncompliance   . CHF (congestive heart failure)    Past Surgical History  Procedure Laterality Date  . Cardiac catheterization  2004    nonobst dz  . Surgery scrotal / testicular      at age 73    Current Outpatient Prescriptions  Medication Sig Dispense Refill  . aspirin EC 81 MG tablet Take 81 mg by mouth daily.      . carvedilol (COREG) 12.5 MG tablet Take 0.5 tablets (6.25 mg total) by mouth 2 (two) times daily with a meal.  30 tablet  5  . furosemide (LASIX) 40 MG tablet Take 1 tablet (40 mg total) by mouth daily.  30 tablet  6  . glimepiride (AMARYL) 2 MG tablet Take 1 tablet (2 mg total) by mouth daily before breakfast.  30 tablet  6  . hydrALAZINE (APRESOLINE) 25 MG tablet Take 1.5 tablets (37.5 mg total) by mouth 3 (three) times daily.  135 tablet   3  . isosorbide mononitrate (IMDUR) 30 MG 24 hr tablet Take 1 tablet (30 mg total) by mouth daily.  30 tablet  3   No current facility-administered medications for this visit.    Allergies  Allergen Reactions  . Ambien [Zolpidem Tartrate] Other (See Comments)    "Went berserk."    History   Social History  . Marital Status: Single    Spouse Name: N/A    Number of Children: N/A  . Years of Education: N/A   Occupational History  . Not on file.   Social History Main Topics  . Smoking status: Never Smoker   . 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 Alcohol Use: Yes     Comment: rare  . Drug Use: No  . Sexual Activity: No   Other Topics Concern  . Not on file   Social History Narrative   Single - divorced x 3 -   Lives with 2 sons, enjoys spending time with 6 g-kids and 3 kids    Family History  Problem Relation Age of Onset  . Peripheral vascular disease    . Hypertension    . Prostate cancer    . Hypertension Mother   . Peripheral vascular disease  Mother   . Prostate cancer Brother     ROS- All systems are reviewed and negative except as per the HPI above  Physical Exam: Filed Vitals:   09/22/12 0825  BP: 152/92  Pulse: 71  Height: 5\' 5"  (1.651 m)  Weight: 182 lb 9.6 oz (82.827 kg)    GEN- The patient is well appearing, alert and oriented x 3 today.   Head- normocephalic, atraumatic Eyes-  Sclera clear, conjunctiva pink Ears- hearing intact Oropharynx- clear Neck- supple, no JVP Lymph- no cervical lymphadenopathy Lungs- Clear to ausculation bilaterally, normal work of breathing Heart- Regular rate and rhythm, no murmurs, rubs or gallops, PMI not laterally displaced GI- soft, NT, ND, + BS Extremities- no clubbing, cyanosis, or edema MS- no significant deformity or atrophy Skin- no rash or lesion Psych- euthymic mood, full affect Neuro- strength and sensation are intact  EKG 09/15/12  reveals sinus with bigeminal PVCs (LBB inferior axis) Echo reviewed Dr Melburn Popper notes are reviewed  Assessment and Plan:  1. PVCs The patient has a very high burden of PVCs.  These appear to be likely outflow tract in origin.  He has failed medical therapy with beta blockers and is referred for EP consultation.  I agree with Dr Gala Romney that given >60k PVCs in 48 hours, that these are likely contributing to his symptoms of fatigue and decreased exercise tolerance.  His depressed EF may also be due to PVCs. Therapeutic strategies for PVCs including medicine and ablation were discussed in detail with the patient today. Risk, benefits, and alternatives to EP study and radiofrequency ablation were also discussed in detail today. These risks include but are not limited to stroke, bleeding, vascular damage, tamponade, perforation, damage to the heart and other structures, AV block requiring pacemaker, worsening renal function, and death. The patient understands these risk and wishes to proceed.  We will therefore proceed with catheter ablation at the next available time.  2. Nonischemic CM Proceed with ablation as above Will need to re-evaluate his EF post ablation.  If his EF remains depressed then he may need to be considered for ICD implant though I am optimistic that his EF will improve.  3. HTN Stable No change required today

## 2012-09-28 NOTE — Addendum Note (Signed)
Addendum created 09/28/12 1545 by Adair Laundry, CRNA   Modules edited: Anesthesia Responsible Staff

## 2012-09-28 NOTE — Transfer of Care (Signed)
Immediate Anesthesia Transfer of Care Note  Patient: [name]  Procedure(s) Performed: Procedure(s): VTach Ablation (N/A)  Patient Location: PACU and Cath Lab  Anesthesia Type:MAC  Level of Consciousness: awake, alert , oriented and patient cooperative  Airway & Oxygen Therapy: Patient Spontanous Breathing  Post-op Assessment: Report given to PACU RN, Post -op Vital signs reviewed and stable and Patient moving all extremities  Post vital signs: Reviewed and stable  Complications: No apparent anesthesia complications

## 2012-09-28 NOTE — Interval H&P Note (Signed)
History and Physical Interval Note:  09/28/2012 9:05 AM  [name]  has presented today for surgery, with the diagnosis of VT tach  The various methods of treatment have been discussed with the patient and family. After consideration of risks, benefits and other options for treatment, the patient has consented to  Procedure(s): VTach Ablation (N/A) as a surgical intervention .  The patient's history has been reviewed, patient examined, no change in status, stable for surgery.  I have reviewed the patient's chart and labs.  Questions were answered to the patient's satisfaction.     Hillis Range

## 2012-09-28 NOTE — Anesthesia Preprocedure Evaluation (Addendum)
Anesthesia Evaluation  Patient identified by MRN, date of birth, ID band Patient awake    Reviewed: Allergy & Precautions, H&P , NPO status , Patient's Chart, lab work & pertinent test results  History of Anesthesia Complications Negative for: history of anesthetic complications  Airway Mallampati: II TM Distance: >3 FB Neck ROM: Full    Dental   Pulmonary asthma ,  Only as a child breath sounds clear to auscultation        Cardiovascular hypertension, + CAD and +CHF + dysrhythmias Ventricular Tachycardia Rhythm:Irregular Rate:Normal     Neuro/Psych    GI/Hepatic   Endo/Other  diabetes, Type 2, Oral Hypoglycemic Agents  Renal/GU Renal InsufficiencyRenal disease     Musculoskeletal   Abdominal (+) + obese,   Peds  Hematology   Anesthesia Other Findings   Reproductive/Obstetrics                          Anesthesia Physical Anesthesia Plan  ASA: III  Anesthesia Plan: MAC   Post-op Pain Management:    Induction: Intravenous  Airway Management Planned: Simple Face Mask  Additional Equipment:   Intra-op Plan:   Post-operative Plan:   Informed Consent: I have reviewed the patients History and Physical, chart, labs and discussed the procedure including the risks, benefits and alternatives for the proposed anesthesia with the patient or authorized representative who has indicated his/her understanding and acceptance.     Plan Discussed with: CRNA and Surgeon  Anesthesia Plan Comments:         Anesthesia Quick Evaluation

## 2012-09-28 NOTE — Op Note (Signed)
SURGEON:  Hillis Range, MD  PREPROCEDURE DIAGNOSES: 1. Premature ventricular contractions  POSTPROCEDURE DIAGNOSES: 1. Premature ventricular contractions  PROCEDURES: 1. isuprel infusion  INTRODUCTION:  [name] is a 57 y.o. male with a history of nonischemic CM and frequent PVCs who now presents for EP study and radiofrequency ablation.  He has had progressive symptoms of CHF.  He has a nonischemic CM.  Recent holter monitor revealed high PVC burden despite medical therapy.  He therefore presents today for EP study and ablation.  DESCRIPTION OF PROCEDURE:  Informed written consent was obtained, and the patient was brought to the electrophysiology lab in a fasting state.  The patient received no sedation for the procedure today.  The patient's left and right groins were prepped and draped in the usual sterile fashion by the EP lab staff.    Initial Measurements: The patient presented to the electrophysiology lab in sinus rhythm.  He was observed to have very rare (<1 PVC every 2-3 minutes) PVCs.  Isuprel was infused at 2 mcg/min and subsequently 5 mcg/min without significant increase in his PVC burden.  He was observed for 30 minutes during isuprel titration and washout.  Due to insufficient PVCs today, his PVC focus could not be mapped or ablated.  His initial PR interval measured 169 msec with a QRS duration of 128 msec and a QT interval of 457 msec.  His PVC was of a LBB inferior axis and measured 176 msec.    Due to insuffienct PVCs, I did not perform venepuncture or place catheters today. The procedure was therefore considered completed.   There were no early apparent complications.  CONCLUSIONS: 1. Sinus rhythm upon presentation with rare PVCs 2. Insufficient PVCs for mapping or ablation today, EP study therefore not performed. 5. No early apparent complications.   Roosevelt Bisher,MD 12:15 PM 09/28/2012

## 2012-10-18 ENCOUNTER — Institutional Professional Consult (permissible substitution): Payer: BC Managed Care – PPO | Admitting: Internal Medicine

## 2012-10-20 ENCOUNTER — Ambulatory Visit (HOSPITAL_COMMUNITY)
Admission: RE | Admit: 2012-10-20 | Discharge: 2012-10-20 | Disposition: A | Discharge status: Home / Self Care | Payer: BC Managed Care – PPO | Source: Ambulatory Visit | Attending: Internal Medicine | Admitting: Internal Medicine

## 2012-10-20 ENCOUNTER — Encounter (HOSPITAL_COMMUNITY): Payer: Self-pay

## 2012-10-20 VITALS — BP 152/86 | HR 75 | Resp 19 | Ht 65.0 in | Wt 185.0 lb

## 2012-10-20 DIAGNOSIS — I4949 Other premature depolarization: Secondary | ICD-10-CM

## 2012-10-20 DIAGNOSIS — I5022 Chronic systolic (congestive) heart failure: Secondary | ICD-10-CM | POA: Insufficient documentation

## 2012-10-20 DIAGNOSIS — I10 Essential (primary) hypertension: Secondary | ICD-10-CM

## 2012-10-20 MED ORDER — LISINOPRIL 5 MG PO TABS: 5.0000 mg | ORAL_TABLET | Freq: Every day | ORAL | Status: DC

## 2012-10-20 NOTE — Patient Instructions (Addendum)
Start lisinopril 5 mg daily.  Will get BMET next week at [facility].  Follow up with pulmonary 10/2 @ 11:45 am   Follow up 4-6 weeks.   Do the following things EVERYDAY: 1) Weigh yourself in the morning before breakfast. Write it down and keep it in a log. 2) Take your medicines as prescribed 3) Eat low salt foods-Limit salt (sodium) to 2000 mg per day.  4) Stay as active as you can everyday 5) Limit all fluids for the day to less than 2 liters 6)

## 2012-10-20 NOTE — Progress Notes (Signed)
Patient ID: [name], male   DOB: 19-Sep-1955, 57 y.o.   MRN: [id number]  Advanced HF Clinic Consult  Weight Range  182 pounds  Baseline proBNP    PCP: Dr [name] HPI: [name] is a 57 year old with a history of HTN, PVCs, chronic systolic heart failure, last cardiac cath 2005 Nonobstructive coronary artery disease (first diagonal 25% stenosis, ramus intermedius 25% stenosis) and borderline DM2.   2009 ECHO EF 45% 2013 ECHO EF 25%   07/30/12 ECHO EF 20-25%  Admitted to [facility] 07/30/12 through 08/02/12 with mild acute/chronic systolic heart failure in setting on medication noncompliance. CEs negative. CKD noted 1.55. pBNP 320 (previous 1200)  He has been out of his medications 2-3 months. He diuresed 2 pounds and was discharged. Discharge weight 178 pounds. Previous spiro and ace stopped due to CRI. Bidil started.  08/25/12 48 hour Holter Monitor- 29% Monomorphic  PVCs referred to Dr Johney Frame   Follow up: Last visit cut carvedilol back to 6.25 mg BID d/t extreme fatigue. Seen by Dr. Johney Frame for possible PVC ablation, however insufficent PVCs for mapping the day he took him to lab. Denies SOB, orthopnea, CP, or edema. Can walk all the way around the grocery store without stopping. Going up steps now without stopping. Weight at home 180-185 lbs. Has not needed any extra lasix. Following low salt diet. Drinking < 2 liters per day. Compliant with medications. Has follow up appointment with Pulmonary for sleep study.   08/09/12 Potassium 3.6 Creatinine 1.7 09/15/12 pro-BNP 604 09/22/12 Potassium 3.5 , Creatinine 1.5  ROS: All systems negative except as listed in HPI, PMH and Problem List.  Past Medical History  Diagnosis Date  . Asthma   . CAD     nonobst cath 2004  . CARDIOMYOPATHY     NICM, LVEF 30% 01/2011 echo; 20-25% 07/2012 echo  . DIABETES MELLITUS, TYPE II   . HYPERCHOLESTEROLEMIA   . HYPERTENSION   . PSA, INCREASED   . Noncompliance   . CHF (congestive heart failure)     Current  Outpatient Prescriptions  Medication Sig Dispense Refill  . aspirin EC 81 MG tablet Take 81 mg by mouth daily.      . carvedilol (COREG) 12.5 MG tablet Take 0.5 tablets (6.25 mg total) by mouth 2 (two) times daily with a meal.  30 tablet  5  . furosemide (LASIX) 40 MG tablet Take 1 tablet (40 mg total) by mouth daily.  30 tablet  6  . glimepiride (AMARYL) 2 MG tablet Take 1 tablet (2 mg total) by mouth daily before breakfast.  30 tablet  6  . hydrALAZINE (APRESOLINE) 25 MG tablet Take 1.5 tablets (37.5 mg total) by mouth 3 (three) times daily.  135 tablet  3  . isosorbide mononitrate (IMDUR) 30 MG 24 hr tablet Take 1 tablet (30 mg total) by mouth daily.  30 tablet  3  . lisinopril (PRINIVIL,ZESTRIL) 5 MG tablet Take 1 tablet (5 mg total) by mouth daily.  30 tablet  6   No current facility-administered medications for this encounter.    Filed Vitals:   10/20/12 1040  BP: 152/86  Pulse: 75  Resp: 19  Height: 5\' 5"  (1.651 m)  Weight: 185 lb (83.915 kg)  SpO2: 98%   PHYSICAL EXAM General:  Well appearing. No resp difficulty HEENT: normal Neck: supple. JVP flat. Carotids 2+ bilaterally; no bruits. No lymphadenopathy or thryomegaly appreciated. Cor: PMI normal. Regular rate & rhythm. No rubs, gallops or murmurs.  Lungs: clear Abdomen: soft, nontender, nondistended. No hepatosplenomegaly. No bruits or masses. Good bowel sounds. Extremities: no cyanosis, clubbing, rash, edema Neuro: alert & orientedx3, cranial nerves grossly intact. Moves all 4 extremities w/o difficulty. Affect pleasant.   ASSESSMENT & PLAN: 1. Chronic Systolic Heart Failure- NICM-cardiac cath 2005 Nonobstructive coronary artery disease (first diagonal 25% stenosis, ramus intermedius 25% stenosis)  07/30/12 ECHO EF 20-25%  - NYHA II symptoms, much improved. Volume status good.  - Will keep coreg at 6.25 mg BID and try to slowly titrate next time (In the past had extreme fatigue) - Start low dose lisinopril 5 mg daily, will  get BMET in 1 week. - Continue hydralazine 50 mg TID and IMDUR 30 mg - evaluated by Dr. Johney Frame for PVC ablation, however insufficent PVCs for mapping  - Reinforced the need and importance of daily weights, a low sodium diet, and fluid restriction (less than 2 L a day). Instructed to call the HF clinic if weight increases more than 3 lbs overnight or 5 lbs in a week.   2. HTN  - Remains elevated. Will add 5 mg lisinopril. Will follow Cr closely and check BMET next week.  3. Snores -Pulmonary evaluation pending for sleep study  4. CKD, stage III - baseline Cr 1.5-1.7 will add low dose lisinopril today and check BMET next week.   Will follow up in 4-6 weeks for med titration. Will need to set ECHO up next visit to re-evaluate EF to determine whether to refer to EP for ICD.  Ulla Potash B 8:37 PM  Patient seen and examined with Ulla Potash, NP. We discussed all aspects of the encounter. I agree with the assessment and plan as stated above.  PVC burden much improved. Symptomatically much better. Agree with continuing to titrate meds for HF and HTN. Reviewed importance of medication compliance. Will need repeat echo soon and EF still depressed consider ICD. Pending sleep study for probable OSA.   Truman Hayward 5:41 PM

## 2012-10-28 ENCOUNTER — Encounter: Payer: Self-pay | Admitting: Pulmonary Disease

## 2012-10-28 ENCOUNTER — Ambulatory Visit (INDEPENDENT_AMBULATORY_CARE_PROVIDER_SITE_OTHER): Payer: BC Managed Care – PPO | Admitting: Pulmonary Disease

## 2012-10-28 ENCOUNTER — Telehealth (HOSPITAL_COMMUNITY): Payer: Self-pay | Admitting: Cardiology

## 2012-10-28 ENCOUNTER — Other Ambulatory Visit (INDEPENDENT_AMBULATORY_CARE_PROVIDER_SITE_OTHER): Payer: BC Managed Care – PPO

## 2012-10-28 VITALS — BP 128/80 | HR 57 | Temp 96.9°F | Ht 65.0 in | Wt 184.2 lb

## 2012-10-28 DIAGNOSIS — G4733 Obstructive sleep apnea (adult) (pediatric): Secondary | ICD-10-CM

## 2012-10-28 DIAGNOSIS — I5022 Chronic systolic (congestive) heart failure: Secondary | ICD-10-CM

## 2012-10-28 LAB — BASIC METABOLIC PANEL
BUN: 20 mg/dL (ref 6–23)
CO2: 28 mEq/L (ref 19–32)
Calcium: 9.1 mg/dL (ref 8.4–10.5)
Chloride: 106 mEq/L (ref 96–112)
Creatinine, Ser: 1.3 mg/dL (ref 0.4–1.5)
GFR: 75.46 mL/min (ref >60.00)
Glucose, Bld: 72 mg/dL (ref 70–99)
Potassium: 3.8 mEq/L (ref 3.5–5.1)
Sodium: 138 mEq/L (ref 135–145)

## 2012-10-28 NOTE — Patient Instructions (Addendum)
Will schedule for sleep study, and arrange followup once the results are available.   

## 2012-10-28 NOTE — Progress Notes (Signed)
Subjective:    Patient ID: [name], male    DOB: 1955-03-27, 57 y.o.   MRN: [id number]  HPI The patient is a 57 year old male who I was asked to see for possible obstructive sleep apnea.  He apparently had a sleep study over 10 years ago, and has no idea what it showed.  He has a known cardiomyopathy, and concern has been raised whether he has sleep disorder breathing.  The patient has a history of loud snoring, and he has been told that he has had an abnormal breathing pattern during sleep.  He does describe occasional choking arousal.  He has frequent awakenings at night, and does not feel rested in the mornings upon arising.  He describes significant sleepiness during the day with periods of inactivity, and he can also get sleepy watching television.  He denies any sleepiness with driving.  The patient's weight is stable over the last 2 years, and his Epworth score today is 7.  Sleep Questionnaire What time do you typically go to bed?( Between what hours) 10:00 pm 10:00 pm at 1144 on 10/28/12 by Hermelinda Medicus How long does it take you to fall asleep? 45 mins 45 mins at 1144 on 10/28/12 by Hermelinda Medicus How many times during the night do you wake up? 5 5 at 1144 on 10/28/12 by Hermelinda Medicus What time do you get out of bed to start your day? 0800 0800 at 1144 on 10/28/12 by Hermelinda Medicus Do you drive or operate heavy machinery in your occupation? No No at 1144 on 10/28/12 by Hermelinda Medicus How much has your weight changed (up or down) over the past two years? (In pounds) Have you ever had a sleep study before? Yes Yes at 1144 on 10/28/12 by Hermelinda Medicus If yes, location of study? [facility] at 1144 on 10/28/12 by Hermelinda Medicus If yes, date of study? 10 -15 years ago 10 -15 years ago at 1144 on 10/28/12 by Hermelinda Medicus Do you currently use CPAP? No No at 1144 on 10/28/12 by Hermelinda Medicus Do you wear oxygen at any time?  No No at 1144 on 10/28/12 by Hermelinda Medicus  Review of Systems  Constitutional: Negative for fever and unexpected weight change.  HENT: Positive for congestion and postnasal drip. Negative for ear pain, nosebleeds, sore throat, rhinorrhea, sneezing, trouble swallowing, dental problem and sinus pressure.   Eyes: Negative for redness and itching.  Respiratory: Negative for cough, chest tightness, shortness of breath and wheezing.   Cardiovascular: Negative for palpitations and leg swelling.  Gastrointestinal: Negative for nausea and vomiting.  Genitourinary: Negative for dysuria.  Musculoskeletal: Negative for joint swelling.  Skin: Negative for rash.  Neurological: Negative for headaches.  Hematological: Does not bruise/bleed easily.  Psychiatric/Behavioral: Negative for dysphoric mood. The patient is not nervous/anxious.        Objective:   Physical Exam Constitutional:  Overweight male, no acute distress  HENT:  Nares patent without discharge  Oropharynx without exudate, palate and uvula are moderately elongated.   Eyes:  Perrla, eomi, no scleral icterus  Neck:  No JVD, no TMG  Cardiovascular:  Normal rate, regular rhythm, no rubs or gallops.  No murmurs        Intact distal pulses  Pulmonary :  Normal breath sounds, no stridor or respiratory distress   No rales, rhonchi, or wheezing  Abdominal:  Soft, nondistended, bowel sounds present.  No tenderness noted.  Musculoskeletal:  minimal lower extremity edema noted.  Lymph Nodes:  No cervical lymphadenopathy noted  Skin:  No cyanosis noted  Neurologic:  Alert, appropriate, moves all 4 extremities without obvious deficit.         Assessment & Plan:

## 2012-10-28 NOTE — Assessment & Plan Note (Signed)
The patient's history is very suggestive of clinically significant obstructive sleep apnea.  This is especially important given his known cardiomyopathy.  I have had a long discussion with him about sleep apnea, including its impact to his quality of life and cardiovascular health.  I think he needs to have a sleep study scheduled for diagnosis, and the patient is agreeable.

## 2012-10-28 NOTE — Addendum Note (Signed)
Addended by: Theresia Bough on: 10/28/2012 12:31 PM   Modules accepted: Orders

## 2012-10-28 NOTE — Addendum Note (Signed)
Addended by: Theresia Bough on: 10/28/2012 12:42 PM   Modules accepted: Orders

## 2012-10-28 NOTE — Telephone Encounter (Signed)
Order placed for BMET recheck

## 2012-10-28 NOTE — Addendum Note (Signed)
Addended by: Theresia Bough on: 10/28/2012 12:48 PM   Modules accepted: Orders

## 2012-11-23 NOTE — Progress Notes (Signed)
Patient ID: [name], male   DOB: [birth year], 57 y.o.   MRN: [id number]   Advanced HF Clinic Consult  Weight Range  182 pounds  Baseline proBNP    PCP: Dr [name] HPI: [name] is a 57 year old with a history of HTN, PVCs, chronic systolic heart failure, last cardiac cath 2005 Nonobstructive coronary artery disease (first diagonal 25% stenosis, ramus intermedius 25% stenosis) and borderline DM2.   2009 ECHO EF 45% 2013 ECHO EF 25%   07/30/12 ECHO EF 20-25% 10/14 ECHO EF 30% with frequent PVCs, mild LV dilation, mild LVH.   Admitted to [facility] 07/30/12 through 08/02/12 with mild acute/chronic systolic heart failure in setting on medication noncompliance. CEs negative. CKD noted 1.55. pBNP 320 (previous 1200)  He has been out of his medications 2-3 months. He diuresed 2 pounds and was discharged. Discharge weight 178 pounds. Previous spiro and ace stopped due to CRI. Bidil started.  08/25/12 48 hour Holter Monitor- 29% Monomorphic  PVCs referred to Dr Johney Frame.  He was taken for PVC mapping to be followed by PVC ablation, but at that time, he was not having enough PVCs to map so no ablation was done.   He returns for follow up. Last visit lisinopril 5 mg added. Mild dyspnea with heavy exertion. Denies PND. + Orthopnea sleeps on 2 pillows. Sleep study this weekend. Taking all medications. Able to walk 30 minutes at a time. He is back at work full time. He is not weighing at home. Following low salt diet. Drinking < 2 liters per day. Compliant with medications.   08/09/12 Potassium 3.6 Creatinine 1.7 09/15/12 pro-BNP 604 09/22/12 Potassium 3.5 , Creatinine 1.5 10/28/12 K 3.8 Creatinine 1.3   ROS: All systems negative except as listed in HPI, PMH and Problem List.  Past Medical History  Diagnosis Date  . Asthma   . CAD     nonobst cath 2004  . CARDIOMYOPATHY     NICM, LVEF 30% 01/2011 echo; 20-25% 07/2012 echo  . DIABETES MELLITUS, TYPE II   . HYPERCHOLESTEROLEMIA   . HYPERTENSION   . PSA,  INCREASED   . Noncompliance   . CHF (congestive heart failure)     Current Outpatient Prescriptions  Medication Sig Dispense Refill  . aspirin EC 81 MG tablet Take 81 mg by mouth daily.      . carvedilol (COREG) 12.5 MG tablet Take 0.5 tablets (6.25 mg total) by mouth 2 (two) times daily with a meal.  30 tablet  5  . furosemide (LASIX) 40 MG tablet Take 1 tablet (40 mg total) by mouth daily.  30 tablet  6  . glimepiride (AMARYL) 2 MG tablet Take 1 tablet (2 mg total) by mouth daily before breakfast.  30 tablet  6  . hydrALAZINE (APRESOLINE) 25 MG tablet Take 1.5 tablets (37.5 mg total) by mouth 3 (three) times daily.  135 tablet  3  . isosorbide mononitrate (IMDUR) 30 MG 24 hr tablet Take 1 tablet (30 mg total) by mouth daily.  30 tablet  3  . lisinopril (PRINIVIL,ZESTRIL) 5 MG tablet Take 1 tablet (5 mg total) by mouth daily.  30 tablet  6   No current facility-administered medications for this encounter.    Filed Vitals:   11/24/12 1055  BP: 176/72  Pulse: 72  Weight: 183 lb 1.9 oz (83.063 kg)  SpO2: 100%   PHYSICAL EXAM General:  Well appearing. No resp difficulty HEENT: normal Neck: supple. JVP flat. Carotids 2+ bilaterally; no  bruits. No lymphadenopathy or thryomegaly appreciated. Cor: PMI normal. Irregular rate & rhythm (PVCs). No rubs, gallops or murmurs. Lungs: clear Abdomen: soft, nontender, nondistended. No hepatosplenomegaly. No bruits or masses. Good bowel sounds. Extremities: no cyanosis, clubbing, rash, edema Neuro: alert & orientedx3, cranial nerves grossly intact. Moves all 4 extremities w/o difficulty. Affect pleasant.   ASSESSMENT & PLAN: 1. Chronic Systolic Heart Failure: NICM, cardiac cath 2005 with nonobstructive coronary artery disease (first diagonal 25% stenosis, ramus intermedius 25% stenosis)  07/30/12 ECHO EF 20-25% . Dr Shirlee Latch discussed and reviewed ECHO today EF 30% with frequent PVCs noted.  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  NYHA II symptoms, much improved. Volume status good. - Continue lasix 40 mg daily.  - Will keep coreg at 6.25 mg BID. (In the past had extreme fatigue with uptitration to 9.375 mg bid) - Continue  lisinopril 5 mg daily - Increase hydralazine 50 mg TID and and increase IMDUR to 60 mg - Evaluated by Dr. Johney Frame for PVC ablation, however insufficent PVCs for mapping => would repeat 24 hour holter to reassess PVC burden.  - Will get serum/urine immunofixation ([place] amyloidosis screen).  - Will get cardiac MRI for more accurate LV functional assessment and to assess for evidence of infiltrative disease (amyloid).  - BMET today: if creatinine ok, would increase lisinopril to 10 mg daily.  - Reinforced the need and importance of daily weights, a low sodium diet, and fluid restriction (less than 2 L a day). Instructed to call the HF clinic if weight increases more than 3 lbs overnight or 5 lbs in a week.   2. HTN:   BP remains elevated.  Increase hydralazine 50 mg tid and increase Imdur 60 mg daily.  Will follow Cr closely and check BMET today.  May increase lisinopril to 10 mg daily if creatinine ok.   3. Snores: Plan for sleep study this weekend to assess for OSA.   4. CKD, stage III:  baseline Cr 1.5-1.7 Check BMET   5. PVCs: PVCs now appear to be frequent again.  I am concerned that he could have a PVC-mediated cardiomyopathy.  Prior PVC burden was 29%, but when he went for possible PVC ablation, not enough PVCs to map.  I am going to repeat 24 hour holter to quantify PVCs and will send back to Dr. Johney Frame.    Follow up in 1 month  CLEGG,AMY 11:11 AM  Patient seen with NP, agree with the above note.  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  However, we do not have a definite etiology, and I think that cardiac MRI to assess for cardiac amyloidosis or other infiltrative disease would be reasonable.   - cMRI - Uptitrate hydralazine/Imdur.  If creatinine ok on BMET today, would increase  lisinopril.   - 24 hour holter to quantify PVCs (seem to be quite frequent) and refer back to Dr. Johney Frame for consideration of PVC ablation.   Marca Ancona 11/25/2012

## 2012-11-24 ENCOUNTER — Encounter (HOSPITAL_COMMUNITY): Payer: Self-pay

## 2012-11-24 ENCOUNTER — Ambulatory Visit (HOSPITAL_COMMUNITY)
Admission: RE | Admit: 2012-11-24 | Discharge: 2012-11-24 | Disposition: A | Discharge status: Home / Self Care | Payer: BC Managed Care – PPO | Source: Ambulatory Visit | Attending: Internal Medicine | Admitting: Internal Medicine

## 2012-11-24 ENCOUNTER — Ambulatory Visit (INDEPENDENT_AMBULATORY_CARE_PROVIDER_SITE_OTHER)
Admission: RE | Admit: 2012-11-24 | Discharge: 2012-11-24 | Disposition: A | Discharge status: Home / Self Care | Payer: BC Managed Care – PPO | Source: Ambulatory Visit | Attending: Internal Medicine | Admitting: Internal Medicine

## 2012-11-24 VITALS — BP 144/86 | HR 72 | Wt 183.1 lb

## 2012-11-24 DIAGNOSIS — R0601 Orthopnea: Secondary | ICD-10-CM | POA: Insufficient documentation

## 2012-11-24 DIAGNOSIS — J45909 Unspecified asthma, uncomplicated: Secondary | ICD-10-CM | POA: Insufficient documentation

## 2012-11-24 DIAGNOSIS — I5022 Chronic systolic (congestive) heart failure: Secondary | ICD-10-CM

## 2012-11-24 DIAGNOSIS — I10 Essential (primary) hypertension: Secondary | ICD-10-CM

## 2012-11-24 DIAGNOSIS — I4949 Other premature depolarization: Secondary | ICD-10-CM | POA: Insufficient documentation

## 2012-11-24 DIAGNOSIS — I517 Cardiomegaly: Secondary | ICD-10-CM

## 2012-11-24 DIAGNOSIS — E7800 Pure hypercholesterolemia, unspecified: Secondary | ICD-10-CM | POA: Insufficient documentation

## 2012-11-24 DIAGNOSIS — R0989 Other specified symptoms and signs involving the circulatory and respiratory systems: Secondary | ICD-10-CM | POA: Insufficient documentation

## 2012-11-24 DIAGNOSIS — G4733 Obstructive sleep apnea (adult) (pediatric): Secondary | ICD-10-CM

## 2012-11-24 DIAGNOSIS — R0609 Other forms of dyspnea: Secondary | ICD-10-CM | POA: Insufficient documentation

## 2012-11-24 DIAGNOSIS — E119 Type 2 diabetes mellitus without complications: Secondary | ICD-10-CM | POA: Insufficient documentation

## 2012-11-24 DIAGNOSIS — I2510 Atherosclerotic heart disease of native coronary artery without angina pectoris: Secondary | ICD-10-CM | POA: Insufficient documentation

## 2012-11-24 DIAGNOSIS — I509 Heart failure, unspecified: Secondary | ICD-10-CM | POA: Insufficient documentation

## 2012-11-24 DIAGNOSIS — N183 Chronic kidney disease, stage 3 unspecified: Secondary | ICD-10-CM | POA: Insufficient documentation

## 2012-11-24 DIAGNOSIS — I129 Hypertensive chronic kidney disease with stage 1 through stage 4 chronic kidney disease, or unspecified chronic kidney disease: Secondary | ICD-10-CM | POA: Insufficient documentation

## 2012-11-24 DIAGNOSIS — I428 Other cardiomyopathies: Secondary | ICD-10-CM | POA: Insufficient documentation

## 2012-11-24 LAB — BASIC METABOLIC PANEL
BUN: 17 mg/dL (ref 6–23)
CO2: 26 mEq/L (ref 19–32)
Calcium: 9.6 mg/dL (ref 8.4–10.5)
Chloride: 103 mEq/L (ref 96–112)
Creatinine, Ser: 1.46 mg/dL — ABNORMAL HIGH (ref 0.50–1.35)
GFR calc Af Amer: 59 mL/min — ABNORMAL LOW (ref >90)
GFR calc non Af Amer: 51 mL/min — ABNORMAL LOW (ref >90)
Glucose, Bld: 80 mg/dL (ref 70–99)
Potassium: 4.3 mEq/L (ref 3.5–5.1)
Sodium: 139 mEq/L (ref 135–145)

## 2012-11-24 LAB — PRO B NATRIURETIC PEPTIDE: Pro B Natriuretic peptide (BNP): 478.9 pg/mL — ABNORMAL HIGH (ref 0–125)

## 2012-11-24 MED ORDER — HYDRALAZINE HCL 50 MG PO TABS: 50.0000 mg | ORAL_TABLET | Freq: Three times a day (TID) | ORAL | Status: DC

## 2012-11-24 MED ORDER — ISOSORBIDE MONONITRATE ER 30 MG PO TB24: 60.0000 mg | ORAL_TABLET | Freq: Every day | ORAL | Status: DC

## 2012-11-24 NOTE — Progress Notes (Signed)
  Echocardiogram 2D Echocardiogram has been performed.  Georgian Co 11/24/2012, 10:59 AM

## 2012-11-24 NOTE — Patient Instructions (Addendum)
Follow up in 1 month  Take hydralazine 50 mg three times a day  Take Imdur 60 mg daily  Your physician has recommended that you wear a holter monitor. Holter monitors are medical devices that record the heart's electrical activity. Doctors most often use these monitors to diagnose arrhythmias. Arrhythmias are problems with the speed or rhythm of the heartbeat. The monitor is a small, portable device. You can wear one while you do your normal daily activities. This is usually used to diagnose what is causing palpitations/syncope (passing out).  You have been referred to Dr Johney Frame  Your physician has requested that you have a cardiac MRI. Cardiac MRI uses a computer to create images of your heart as its beating, producing both still and moving pictures of your heart and major blood vessels. For further information please visit [email or web address]. Please follow the instruction sheet given to you today for more information.  WE WILL CALL YOU TO SCHEDULE THIS WHEN YOUR INSURANCE HAS APPROVED IT  Do the following things EVERYDAY: 1) Weigh yourself in the morning before breakfast. Write it down and keep it in a log. 2) Take your medicines as prescribed 3) Eat low salt foods-Limit salt (sodium) to 2000 mg per day.  4) Stay as active as you can everyday 5) Limit all fluids for the day to less than 2 liters

## 2012-11-28 ENCOUNTER — Ambulatory Visit (HOSPITAL_BASED_OUTPATIENT_CLINIC_OR_DEPARTMENT_OTHER): Payer: BC Managed Care – PPO | Attending: Pulmonary Disease

## 2012-11-28 DIAGNOSIS — I4949 Other premature depolarization: Secondary | ICD-10-CM | POA: Insufficient documentation

## 2012-11-28 DIAGNOSIS — G4737 Central sleep apnea in conditions classified elsewhere: Secondary | ICD-10-CM | POA: Insufficient documentation

## 2012-11-28 DIAGNOSIS — G4733 Obstructive sleep apnea (adult) (pediatric): Secondary | ICD-10-CM

## 2012-12-03 ENCOUNTER — Telehealth: Payer: Self-pay | Admitting: Internal Medicine

## 2012-12-03 DIAGNOSIS — I472 Ventricular tachycardia: Secondary | ICD-10-CM

## 2012-12-03 NOTE — Telephone Encounter (Signed)
New message    Did we receive a letter from [employer]?

## 2012-12-03 NOTE — Telephone Encounter (Signed)
Spoke with patient and let him know I have not received any thing yet.  He will call me and let me know what is needed

## 2012-12-08 DIAGNOSIS — G4730 Sleep apnea, unspecified: Secondary | ICD-10-CM

## 2012-12-08 DIAGNOSIS — G4710 Hypersomnia, unspecified: Secondary | ICD-10-CM

## 2012-12-08 DIAGNOSIS — G4733 Obstructive sleep apnea (adult) (pediatric): Secondary | ICD-10-CM

## 2012-12-10 NOTE — Procedures (Signed)
NAMEJESSEY, [name] NO.:  [id number]  MEDICAL RECORD NO.:  [id number]          PATIENT TYPE:  OUT  LOCATION:  SLEEP CENTER                 FACILITY[facility]  [facility]  PHYSICIAN:  Barbaraann Share, MD,FCCPDATE OF BIRTH:  September 15, 1955  DATE OF STUDY:  11/28/2012                           NOCTURNAL POLYSOMNOGRAM  REFERRING PHYSICIAN:  Barbaraann Share, MD,FCCP  INDICATION FOR STUDY:  Hypersomnia with sleep apnea.  EPWORTH SLEEPINESS SCORE:  8.  MEDICATIONS:  SLEEP ARCHITECTURE:  The patient had a total sleep time of 391 minutes with no slow-wave sleep and only 101 minutes of REM.  Sleep onset latency was rapid at 0.5 minutes and REM onset was normal at 65 minutes. Sleep efficiency was excellent at 98%.  RESPIRATORY DATA:  The patient was found to have 66 obstructive and central apneas, as well as 19 obstructive hypopneas, giving him an apnea- hypopnea index of 13 events per hour.  The events occurred in all body positions and there was very loud snoring noted throughout.  OXYGEN DATA:  There was oxygen desaturation as low as 87% with the patient's obstructive events.  CARDIAC DATA:  Frequent PVCs were noted throughout.  MOVEMENT-PARASOMNIA:  The patient had no significant leg jerks or other abnormal behaviors noted.  IMPRESSIONS-RECOMMENDATIONS: 1. Mild obstructive and central sleep apnea, with an AHI of 13 events     per hour and oxygen desaturation as low as 87%.  Treatment for this     degree of sleep apnea can include a trial of weight loss alone,     upper airway surgery, dental appliance, and also CPAP.  Clinical     correlation is suggested. 2. Frequent PVCs noted throughout the night.     Barbaraann Share, MD,FCCP Diplomate, American Board of Sleep Medicine   [facility]  D:  12/08/2012 08:46:14  T:  12/09/2012 00:00:47  Job:  [id number]

## 2012-12-13 ENCOUNTER — Encounter: Payer: Self-pay | Admitting: *Deleted

## 2012-12-13 ENCOUNTER — Encounter (INDEPENDENT_AMBULATORY_CARE_PROVIDER_SITE_OTHER): Payer: Self-pay

## 2012-12-13 ENCOUNTER — Encounter (INDEPENDENT_AMBULATORY_CARE_PROVIDER_SITE_OTHER): Payer: BC Managed Care – PPO

## 2012-12-13 DIAGNOSIS — I5022 Chronic systolic (congestive) heart failure: Secondary | ICD-10-CM

## 2012-12-13 DIAGNOSIS — R55 Syncope and collapse: Secondary | ICD-10-CM

## 2012-12-13 NOTE — Progress Notes (Signed)
Patient ID: [name], male   DOB: 1955-08-16, 57 y.o.   MRN: [id number] E-CARDIO 24 HOUR HOLTER MONITOR APPLIED PATIENT.

## 2012-12-14 ENCOUNTER — Encounter: Payer: Self-pay | Admitting: Pulmonary Disease

## 2012-12-14 ENCOUNTER — Ambulatory Visit (INDEPENDENT_AMBULATORY_CARE_PROVIDER_SITE_OTHER): Payer: BC Managed Care – PPO | Admitting: Pulmonary Disease

## 2012-12-14 VITALS — BP 142/70 | HR 68 | Temp 97.7°F | Ht 65.0 in | Wt 190.4 lb

## 2012-12-14 DIAGNOSIS — G4733 Obstructive sleep apnea (adult) (pediatric): Secondary | ICD-10-CM

## 2012-12-14 NOTE — Patient Instructions (Signed)
Work on weight loss of 20 pounds over the next 6 mos.  If you are not successful, please call us to consider a trial of cpap.

## 2012-12-14 NOTE — Assessment & Plan Note (Signed)
The patient has mild obstructive sleep apnea by his recent sleep study, with minimal oxygen desaturation.  Treatment for this can consist of a trial of weight loss alone, versus more aggressive therapy with a dental appliance or CPAP.  The patient has poor dentition and is not a good candidate for a dental appliance.  Given the mild nature of his sleep disordered breathing, it is unclear whether aggressive treatment will improve his cardiovascular status.  The patient is somewhat symptomatic at night and during the day, and I have encouraged him to consider a trial of CPAP.  He is concerned about having something on his face, and would rather try weight loss first.  He is willing to give CPAP a try if he is unable to do so.

## 2012-12-14 NOTE — Progress Notes (Signed)
  Subjective:    Patient ID: [name], male    DOB: 1955-05-19, 57 y.o.   MRN: [id number]  HPI The patient comes in today for followup after his recent sleep study.  He was found to have mild obstructive sleep apnea, with an AHI of 13 events per hour and transient saturation as low as 87%.  I have reviewed the study with him in detail, and answered all of his questions.   Review of Systems  Constitutional: Negative for fever and unexpected weight change.  HENT: Negative for congestion, dental problem, ear pain, nosebleeds, postnasal drip, rhinorrhea, sinus pressure, sneezing, sore throat and trouble swallowing.   Eyes: Negative for redness and itching.  Respiratory: Negative for cough, chest tightness, shortness of breath and wheezing.   Cardiovascular: Negative for palpitations and leg swelling.  Gastrointestinal: Negative for nausea and vomiting.  Genitourinary: Negative for dysuria.  Musculoskeletal: Negative for joint swelling.  Skin: Negative for rash.  Neurological: Negative for headaches.  Hematological: Does not bruise/bleed easily.  Psychiatric/Behavioral: Negative for dysphoric mood. The patient is not nervous/anxious.        Objective:   Physical Exam Overweight male in no acute distress Nose without purulence or discharge noted Neck without lymphadenopathy or thyromegaly Lower extremities with minimal edema, no cyanosis Alert and oriented, moves all 4 extremities.       Assessment & Plan:

## 2012-12-24 ENCOUNTER — Encounter: Payer: Self-pay | Admitting: Internal Medicine

## 2012-12-24 ENCOUNTER — Ambulatory Visit (INDEPENDENT_AMBULATORY_CARE_PROVIDER_SITE_OTHER): Payer: BC Managed Care – PPO | Admitting: Internal Medicine

## 2012-12-24 VITALS — BP 160/84 | HR 69 | Ht 65.0 in | Wt 188.8 lb

## 2012-12-24 DIAGNOSIS — I4949 Other premature depolarization: Secondary | ICD-10-CM

## 2012-12-24 DIAGNOSIS — I493 Ventricular premature depolarization: Secondary | ICD-10-CM

## 2012-12-24 DIAGNOSIS — I5022 Chronic systolic (congestive) heart failure: Secondary | ICD-10-CM

## 2012-12-24 DIAGNOSIS — I428 Other cardiomyopathies: Secondary | ICD-10-CM

## 2012-12-24 NOTE — Patient Instructions (Addendum)
Your physician recommends that you continue on your current medications as directed. Please refer to the Current Medication list given to you today.  Your physician has recommended that you have a VT ablation. Catheter ablation is a medical procedure used to treat some cardiac arrhythmias (irregular heartbeats). During catheter ablation, a long, thin, flexible tube is put into a blood vessel in your groin (upper thigh), or neck. This tube is called an ablation catheter. It is then guided to your heart through the blood vessel. Radio frequency waves destroy small areas of heart tissue where abnormal heartbeats may cause an arrhythmia to start. Please see the instruction sheet given to you today.            Dennis Bast, RN (Dr. Jenel Lucks nurse) will be in touch to schedule this procedure  Hold your Coreg for 48 hours prior to procedure

## 2012-12-26 NOTE — Progress Notes (Signed)
Primary Care Physician: Rene Paci, MD Referring Physician:  Dr Magda Bernheim is a 57 y.o. male with a h/o nonischemic cardiomyopathy and very frequent PVCs who presents today for EP follow-up.  The patient has had a nonischemic cardmiomyopathy for several years.  He is followed by Dr Gala Romney in the advanced heart failure clinic.  He is noted to have very frequent PVCs.  His PVCs are felt to be contributing to low EF/ symptoms of fatigue and decreased exercise tolerance.  He also reports occasional "fluttering" in his chest.   He presented to the EP lab 10/18/12 but unfortunately was not having PVCs in the lab.  I therefore could not perform ablation.  Today, he denies symptoms of chest pain,  orthopnea, PND, lower extremity edema, dizziness, presyncope, syncope, or neurologic sequela. The patient is tolerating medications without difficulties and is otherwise without complaint today.   Past Medical History  Diagnosis Date  . Asthma   . CAD     nonobst cath 2004  . CARDIOMYOPATHY     NICM, LVEF 30% 01/2011 echo; 20-25% 07/2012 echo  . DIABETES MELLITUS, TYPE II   . HYPERCHOLESTEROLEMIA   . HYPERTENSION   . PSA, INCREASED   . Noncompliance   . CHF (congestive heart failure)    Past Surgical History  Procedure Laterality Date  . Cardiac catheterization  2004    nonobst dz  . Surgery scrotal / testicular      at age 29    Current Outpatient Prescriptions  Medication Sig Dispense Refill  . aspirin EC 81 MG tablet Take 81 mg by mouth daily.      . carvedilol (COREG) 12.5 MG tablet Take 0.5 tablets (6.25 mg total) by mouth 2 (two) times daily with a meal.  30 tablet  5  . furosemide (LASIX) 40 MG tablet Take 1 tablet (40 mg total) by mouth daily.  30 tablet  6  . glimepiride (AMARYL) 2 MG tablet Take 1 tablet (2 mg total) by mouth daily before breakfast.  30 tablet  6  . hydrALAZINE (APRESOLINE) 50 MG tablet Take 1 tablet (50 mg total) by mouth 3 (three) times  daily.  90 tablet  3  . isosorbide mononitrate (IMDUR) 30 MG 24 hr tablet Take 2 tablets (60 mg total) by mouth daily.  60 tablet  3  . lisinopril (PRINIVIL,ZESTRIL) 5 MG tablet Take 1 tablet (5 mg total) by mouth daily.  30 tablet  6   No current facility-administered medications for this visit.    Allergies  Allergen Reactions  . Ambien [Zolpidem Tartrate] Other (See Comments)    "Went berserk."    History   Social History  . Marital Status: Single    Spouse Name: N/A    Number of Children: N/A  . Years of Education: N/A   Occupational History  . Health services    Social History Main Topics  . Smoking status: Never Smoker   . 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 Alcohol Use: No     Comment: rare  . Drug Use: No  . Sexual Activity: No   Other Topics Concern  . Not on file   Social History Narrative   Single - divorced x 3 -   Lives with 2 sons, enjoys spending time with 6 g-kids and 3 kids    Family History  Problem Relation Age of Onset  .  Peripheral vascular disease    . Hypertension    . Prostate cancer    . Hypertension Mother   . Peripheral vascular disease Mother   . Prostate cancer Brother     ROS- All systems are reviewed and negative except as per the HPI above  Physical Exam: Filed Vitals:   12/24/12 1445  BP: 160/84  Pulse: 69  Height: 5\' 5"  (1.651 m)  Weight: 188 lb 12.8 oz (85.639 kg)    GEN- The patient is well appearing, alert and oriented x 3 today.   Head- normocephalic, atraumatic Eyes-  Sclera clear, conjunctiva pink Ears- hearing intact Oropharynx- clear Neck- supple, no JVP Lymph- no cervical lymphadenopathy Lungs- Clear to ausculation bilaterally, normal work of breathing Heart- Regular rate and rhythm, no murmurs, rubs or gallops, PMI not laterally displaced GI- soft, NT, ND, + BS Extremities- no clubbing, cyanosis, or edema MS- no significant deformity or  atrophy Skin- no rash or lesion Psych- euthymic mood, full affect Neuro- strength and sensation are intact  EKG 09/15/12 reveals sinus with bigeminal PVCs (LBB inferior axis) Echo reviewed Dr Melburn Popper notes are reviewed  Assessment and Plan:  1. PVCs The patient has a very high burden of PVCs.  These appear to be likely outflow tract in origin.  He has failed medical therapy with beta blockers.  Unfortunately he did not have PVCs when I brought him to the EP lab before.  Today, he has PVCs on ekg.  We discussed the possibility of a repeat attempt at ablation.    Therapeutic strategies for PVCs including medicine and ablation were discussed in detail with the patient today. Risk, benefits, and alternatives to EP study and radiofrequency ablation were also discussed in detail today. These risks include but are not limited to stroke, bleeding, vascular damage, tamponade, perforation, damage to the heart and other structures, AV block requiring pacemaker, worsening renal function, and death. The patient understands these risk and wishes to proceed.  We will therefore proceed with catheter ablation at the next available time.  He is instructed to hold his coreg for 48 hours prior to ablation.  2. Nonischemic CM Proceed with ablation as above Will need to re-evaluate his EF post ablation.  If his EF remains depressed then he may need to be considered for ICD implant though I am optimistic that his EF will improve.  3. HTN Stable No change required today

## 2012-12-31 ENCOUNTER — Encounter: Payer: Self-pay | Admitting: *Deleted

## 2012-12-31 ENCOUNTER — Other Ambulatory Visit: Payer: Self-pay | Admitting: *Deleted

## 2012-12-31 ENCOUNTER — Other Ambulatory Visit (INDEPENDENT_AMBULATORY_CARE_PROVIDER_SITE_OTHER): Payer: BC Managed Care – PPO

## 2012-12-31 DIAGNOSIS — I472 Ventricular tachycardia: Secondary | ICD-10-CM

## 2012-12-31 LAB — CBC WITH DIFFERENTIAL/PLATELET
Basophils Absolute: 0 10*3/uL (ref 0.0–0.1)
Basophils Relative: 0.7 % (ref 0.0–3.0)
Eosinophils Absolute: 0.2 10*3/uL (ref 0.0–0.7)
Eosinophils Relative: 2.3 % (ref 0.0–5.0)
HCT: 39 % (ref 39.0–52.0)
Hemoglobin: 13 g/dL (ref 13.0–17.0)
Lymphocytes Relative: 33.8 % (ref 12.0–46.0)
Lymphs Abs: 2.2 10*3/uL (ref 0.7–4.0)
MCHC: 33.3 g/dL (ref 30.0–36.0)
MCV: 89.9 fl (ref 78.0–100.0)
Monocytes Absolute: 0.5 10*3/uL (ref 0.1–1.0)
Monocytes Relative: 7.3 % (ref 3.0–12.0)
Neutro Abs: 3.6 10*3/uL (ref 1.4–7.7)
Neutrophils Relative %: 55.9 % (ref 43.0–77.0)
Platelets: 272 10*3/uL (ref 150.0–400.0)
RBC: 4.34 Mil/uL (ref 4.22–5.81)
RDW: 14.1 % (ref 11.5–14.6)
WBC: 6.5 10*3/uL (ref 4.5–10.5)

## 2012-12-31 LAB — BASIC METABOLIC PANEL
BUN: 21 mg/dL (ref 6–23)
CO2: 28 mEq/L (ref 19–32)
Calcium: 9.5 mg/dL (ref 8.4–10.5)
Chloride: 105 mEq/L (ref 96–112)
Creatinine, Ser: 1.4 mg/dL (ref 0.4–1.5)
GFR: 68.48 mL/min (ref >60.00)
Glucose, Bld: 119 mg/dL — ABNORMAL HIGH (ref 70–99)
Potassium: 3.9 mEq/L (ref 3.5–5.1)
Sodium: 140 mEq/L (ref 135–145)

## 2012-12-31 NOTE — Telephone Encounter (Signed)
Patient to have VT ablation on 01/07/13  He is aware of date and time

## 2013-01-06 ENCOUNTER — Ambulatory Visit (HOSPITAL_COMMUNITY)
Admission: RE | Admit: 2013-01-06 | Discharge: 2013-01-06 | Disposition: A | Discharge status: Home / Self Care | Payer: BC Managed Care – PPO | Source: Ambulatory Visit | Attending: Internal Medicine | Admitting: Internal Medicine

## 2013-01-06 VITALS — BP 138/64 | HR 86 | Wt 184.5 lb

## 2013-01-06 DIAGNOSIS — I10 Essential (primary) hypertension: Secondary | ICD-10-CM | POA: Insufficient documentation

## 2013-01-06 DIAGNOSIS — N182 Chronic kidney disease, stage 2 (mild): Secondary | ICD-10-CM

## 2013-01-06 DIAGNOSIS — I5022 Chronic systolic (congestive) heart failure: Secondary | ICD-10-CM | POA: Insufficient documentation

## 2013-01-06 DIAGNOSIS — G4733 Obstructive sleep apnea (adult) (pediatric): Secondary | ICD-10-CM | POA: Insufficient documentation

## 2013-01-06 MED ORDER — HYDRALAZINE HCL 50 MG PO TABS: 50.0000 mg | ORAL_TABLET | Freq: Three times a day (TID) | ORAL | Status: DC

## 2013-01-06 MED ORDER — ISOSORBIDE MONONITRATE ER 30 MG PO TB24: 60.0000 mg | ORAL_TABLET | Freq: Every day | ORAL | Status: DC

## 2013-01-06 NOTE — Patient Instructions (Signed)
Follow up 6 weeks  DO NOT TAKE CARVEDILOL TONIGHT OR  TOMORROW  Take hydralazine 50 mg three times a day  Take imdur 60 mg daily   Do the following things EVERYDAY: 1) Weigh yourself in the morning before breakfast. Write it down and keep it in a log. 2) Take your medicines as prescribed 3) Eat low salt foods-Limit salt (sodium) to 2000 mg per day.  4) Stay as active as you can everyday 5) Limit all fluids for the day to less than 2 liters

## 2013-01-06 NOTE — Progress Notes (Signed)
Patient ID: [name], male   DOB: February 19, 1955, 57 y.o.   MRN: [id number]   Advanced HF Clinic Consult  Weight Range  182 pounds  Baseline proBNP    PCP: Dr [name] HPI: Mr Pruden is a 57 year old with a history of HTN, PVCs, chronic systolic heart failure, last cardiac cath 2005 Nonobstructive coronary artery disease (first diagonal 25% stenosis, ramus intermedius 25% stenosis) and borderline DM2.   2009 ECHO EF 45% 2013 ECHO EF 25%   07/30/12 ECHO EF 20-25% 10/14 ECHO EF 30% with frequent PVCs, mild LV dilation, mild LVH.   Admitted to [facility] 07/30/12 through 08/02/12 with mild acute/chronic systolic heart failure in setting on medication noncompliance. CEs negative. CKD noted 1.55. pBNP 320 (previous 1200)  He has been out of his medications 2-3 months. He diuresed 2 pounds and was discharged. Discharge weight 178 pounds. Previous spiro and ace stopped due to CRI. Bidil started.  08/25/12 48 hour Holter Monitor- 29% Monomorphic  PVCs referred to Dr Johney Frame.  He was taken for PVC mapping to be followed by PVC ablation, but at that time, he was not having enough PVCs to map so no ablation was done.   He returns for follow up. Last visit he was supposed to start taking hydralazine 50 mg three times a day but he continued on 37.5 mg three times a day. Denies SOB/PND/Orthopnea. Does admit to dyspnea going up steps and fatigue when he moves quickly. He had sleep study and he has decided he wants to lose weight and not try CPAP. Not exercising. Taking all medications.  He is back at work full time. Not weighing at home.  Following low salt diet. Drinking < 2 liters per day. Compliant with medications.    08/09/12 Potassium 3.6 Creatinine 1.7 09/15/12 pro-BNP 604 09/22/12 Potassium 3.5 , Creatinine 1.5 10/28/12 K 3.8 Creatinine 1.3 11/24/12 Pro BNP 478 12/31/12 K 3.9 Creatinine 1.4    ROS: All systems negative except as listed in HPI, PMH and Problem List.  Past Medical History  Diagnosis Date  .  Asthma   . CAD     nonobst cath 2004  . CARDIOMYOPATHY     NICM, LVEF 30% 01/2011 echo; 20-25% 07/2012 echo  . DIABETES MELLITUS, TYPE II   . HYPERCHOLESTEROLEMIA   . HYPERTENSION   . PSA, INCREASED   . Noncompliance   . CHF (congestive heart failure)     Current Outpatient Prescriptions  Medication Sig Dispense Refill  . aspirin EC 81 MG tablet Take 81 mg by mouth daily.      . carvedilol (COREG) 12.5 MG tablet Take 0.5 tablets (6.25 mg total) by mouth 2 (two) times daily with a meal.  30 tablet  5  . furosemide (LASIX) 40 MG tablet Take 1 tablet (40 mg total) by mouth daily.  30 tablet  6  . glimepiride (AMARYL) 2 MG tablet Take 1 tablet (2 mg total) by mouth daily before breakfast.  30 tablet  6  . hydrALAZINE (APRESOLINE) 25 MG tablet Take 37.5 mg by mouth 3 (three) times daily.      . isosorbide mononitrate (IMDUR) 30 MG 24 hr tablet Take 30 mg by mouth daily.      [name] lisinopril (PRINIVIL,ZESTRIL) 5 MG tablet Take 1 tablet (5 mg total) by mouth daily.  30 tablet  6   No current facility-administered medications for this encounter.    Filed Vitals:   01/06/13 0948  BP: 138/64  Pulse: 86  Weight: 184 lb 8 oz (83.689 kg)  SpO2: 98%   PHYSICAL EXAM General:  Well appearing. No resp difficulty HEENT: normal Neck: supple. JVP flat. Carotids 2+ bilaterally; no bruits. No lymphadenopathy or thryomegaly appreciated. Cor: PMI normal. Irregular rate & rhythm . No rubs, gallops or murmurs. Lungs: clear Abdomen: soft, nontender, nondistended. No hepatosplenomegaly. No bruits or masses. Good bowel sounds. Extremities: no cyanosis, clubbing, rash, edema Neuro: alert & orientedx3, cranial nerves grossly intact. Moves all 4 extremities w/o difficulty. Affect pleasant.   ASSESSMENT & PLAN: 1. Chronic Systolic Heart Failure: NICM, cardiac cath 2005 with nonobstructive coronary artery disease (first diagonal 25% stenosis, ramus intermedius 25% stenosis)  07/30/12 ECHO EF 20-25% repeat ECHO  11/24/12 EF ~30% with frequent PVCs.  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  NYHA II symptoms Volume status good.  Continue lasix 40 mg daily.  - Will keep on current dose of coreg at 6.25 mg BID. (In the past had extreme fatigue with uptitration to 9.375 mg bid). Resume after PVC ablation.  - Continue  lisinopril 5 mg daily. Not on spiro due to ckd.  - Increase hydralazine 50 mg TID and and increase IMDUR to 60 mg daily I went over this again today with him.  - Evaluated by Dr. Johney Frame with plan PVC ablation tomorrow. He was instructed to hold carvedilol for 48 hours prior to procedure but he continued to take it. I contacted Financial controller with  EP and she is aware. I instructed him -- to not take carvedilol tonight or tomorrow.    -Will need repeat ECHO after ablation. Hold off on Cardiac MRI pending.  - Reinforced the need and importance of daily weights, a low sodium diet, and fluid restriction (less than 2 L a day). Instructed to call the HF clinic if weight increases more than 3 lbs overnight or 5 lbs in a week.   2. HTN: Mildly elevated. As noted above will increase hydralazine to 50 mg tid and increase Imdur 60 mg daily. Continue lisinopril 5 mg daily. Resume carvedilol 6.25 mg twice a day after ablation.   3. Snores: Sleep study completed. Recommendation to try CPAP but he elected to try weight loss.   4. CKD, stage III:  Reviewed BMET form 12/5 14.  Renal function stable. Creatinine baseline  1.5-1.7  5. PVCs: Plan for ablation tomorrow by Dr Johney Frame.   Follow up in  6 weeks.   Federico Maiorino NP-C 10:14 AM

## 2013-01-07 ENCOUNTER — Ambulatory Visit (HOSPITAL_COMMUNITY)
Admission: RE | Admit: 2013-01-07 | Discharge: 2013-01-07 | Disposition: A | Discharge status: Home / Self Care | Payer: BC Managed Care – PPO | Source: Ambulatory Visit | Attending: Internal Medicine | Admitting: Internal Medicine

## 2013-01-07 ENCOUNTER — Encounter (HOSPITAL_COMMUNITY): Payer: BC Managed Care – PPO | Admitting: Anesthesiology

## 2013-01-07 ENCOUNTER — Ambulatory Visit (HOSPITAL_COMMUNITY): Payer: BC Managed Care – PPO | Admitting: Anesthesiology

## 2013-01-07 ENCOUNTER — Encounter (HOSPITAL_COMMUNITY)
Admission: RE | Disposition: A | Discharge status: Home / Self Care | Payer: Self-pay | Source: Ambulatory Visit | Attending: Internal Medicine

## 2013-01-07 ENCOUNTER — Encounter (HOSPITAL_COMMUNITY): Payer: Self-pay | Admitting: Certified Registered Nurse Anesthetist

## 2013-01-07 DIAGNOSIS — I4729 Other ventricular tachycardia: Secondary | ICD-10-CM | POA: Insufficient documentation

## 2013-01-07 DIAGNOSIS — I10 Essential (primary) hypertension: Secondary | ICD-10-CM | POA: Insufficient documentation

## 2013-01-07 DIAGNOSIS — I4949 Other premature depolarization: Secondary | ICD-10-CM | POA: Diagnosis present

## 2013-01-07 DIAGNOSIS — J45909 Unspecified asthma, uncomplicated: Secondary | ICD-10-CM | POA: Insufficient documentation

## 2013-01-07 DIAGNOSIS — Z7982 Long term (current) use of aspirin: Secondary | ICD-10-CM | POA: Insufficient documentation

## 2013-01-07 DIAGNOSIS — I472 Ventricular tachycardia, unspecified: Secondary | ICD-10-CM | POA: Insufficient documentation

## 2013-01-07 DIAGNOSIS — E119 Type 2 diabetes mellitus without complications: Secondary | ICD-10-CM | POA: Insufficient documentation

## 2013-01-07 DIAGNOSIS — I2510 Atherosclerotic heart disease of native coronary artery without angina pectoris: Secondary | ICD-10-CM | POA: Insufficient documentation

## 2013-01-07 DIAGNOSIS — I493 Ventricular premature depolarization: Secondary | ICD-10-CM | POA: Diagnosis present

## 2013-01-07 DIAGNOSIS — I509 Heart failure, unspecified: Secondary | ICD-10-CM | POA: Insufficient documentation

## 2013-01-07 DIAGNOSIS — I428 Other cardiomyopathies: Secondary | ICD-10-CM | POA: Insufficient documentation

## 2013-01-07 DIAGNOSIS — E7800 Pure hypercholesterolemia, unspecified: Secondary | ICD-10-CM | POA: Insufficient documentation

## 2013-01-07 HISTORY — DX: Ventricular premature depolarization: I49.3

## 2013-01-07 HISTORY — PX: V-TACH ABLATION: SHX5498

## 2013-01-07 HISTORY — PX: ABLATION: SHX5711

## 2013-01-07 LAB — GLUCOSE, CAPILLARY
Glucose-Capillary: 88 mg/dL (ref 70–99)
Glucose-Capillary: 81 mg/dL (ref 70–99)
Glucose-Capillary: 80 mg/dL (ref 70–99)
Glucose-Capillary: 74 mg/dL (ref 70–99)

## 2013-01-07 SURGERY — V-TACH ABLATION
Anesthesia: Monitor Anesthesia Care

## 2013-01-07 MED ORDER — SODIUM CHLORIDE 0.9 % IV SOLN: 250.0000 mL | INTRAVENOUS | Status: DC | PRN

## 2013-01-07 MED ORDER — ISOSORBIDE MONONITRATE ER 60 MG PO TB24
60.0000 mg | ORAL_TABLET | Freq: Every day | ORAL | Status: DC
Start: 2013-01-07 — End: 2013-01-07
  Administered 2013-01-07: 60 mg via ORAL
  Filled 2013-01-07: qty 1

## 2013-01-07 MED ORDER — SODIUM CHLORIDE 0.9 % IJ SOLN: 3.0000 mL | INTRAMUSCULAR | Status: DC | PRN

## 2013-01-07 MED ORDER — ISOPROTERENOL HCL 0.2 MG/ML IJ SOLN
INTRAMUSCULAR | Status: AC
  Filled 2013-01-07: qty 5

## 2013-01-07 MED ORDER — FENTANYL CITRATE 0.05 MG/ML IJ SOLN
INTRAMUSCULAR | Status: DC | PRN
  Administered 2013-01-07 (×4): 25 ug via INTRAVENOUS

## 2013-01-07 MED ORDER — BUPIVACAINE HCL (PF) 0.25 % IJ SOLN
INTRAMUSCULAR | Status: AC
  Filled 2013-01-07: qty 60

## 2013-01-07 MED ORDER — ACETAMINOPHEN 325 MG PO TABS: 650.0000 mg | ORAL_TABLET | ORAL | Status: DC | PRN

## 2013-01-07 MED ORDER — LACTATED RINGERS IV SOLN
INTRAVENOUS | Status: DC | PRN
  Administered 2013-01-07: 07:00:00 via INTRAVENOUS

## 2013-01-07 MED ORDER — MIDAZOLAM HCL 5 MG/5ML IJ SOLN
INTRAMUSCULAR | Status: DC | PRN
  Administered 2013-01-07: 1 mg via INTRAVENOUS

## 2013-01-07 MED ORDER — CARVEDILOL 6.25 MG PO TABS
6.2500 mg | ORAL_TABLET | Freq: Two times a day (BID) | ORAL | Status: DC
  Administered 2013-01-07: 6.25 mg via ORAL
  Filled 2013-01-07 (×2): qty 1

## 2013-01-07 MED ORDER — HYDRALAZINE HCL 50 MG PO TABS
50.0000 mg | ORAL_TABLET | Freq: Three times a day (TID) | ORAL | Status: DC
  Administered 2013-01-07 (×2): 50 mg via ORAL
  Filled 2013-01-07 (×3): qty 1

## 2013-01-07 MED ORDER — FUROSEMIDE 40 MG PO TABS
40.0000 mg | ORAL_TABLET | Freq: Every day | ORAL | Status: DC
  Administered 2013-01-07: 40 mg via ORAL
  Filled 2013-01-07: qty 1

## 2013-01-07 MED ORDER — HYDROCODONE-ACETAMINOPHEN 5-325 MG PO TABS: 1.0000 | ORAL_TABLET | ORAL | Status: DC | PRN

## 2013-01-07 MED ORDER — ONDANSETRON HCL 4 MG/2ML IJ SOLN: 4.0000 mg | Freq: Four times a day (QID) | INTRAMUSCULAR | Status: DC | PRN

## 2013-01-07 MED ORDER — PROPOFOL INFUSION 10 MG/ML OPTIME
INTRAVENOUS | Status: DC | PRN
  Administered 2013-01-07: 50 ug/kg/min via INTRAVENOUS

## 2013-01-07 MED ORDER — GLIMEPIRIDE 2 MG PO TABS
2.0000 mg | ORAL_TABLET | Freq: Every day | ORAL | Status: DC
  Filled 2013-01-07: qty 1

## 2013-01-07 MED ORDER — LISINOPRIL 5 MG PO TABS
5.0000 mg | ORAL_TABLET | Freq: Every day | ORAL | Status: DC
  Administered 2013-01-07: 5 mg via ORAL
  Filled 2013-01-07: qty 1

## 2013-01-07 MED ORDER — SODIUM CHLORIDE 0.9 % IJ SOLN
3.0000 mL | Freq: Two times a day (BID) | INTRAMUSCULAR | Status: DC
  Administered 2013-01-07: 3 mL via INTRAVENOUS

## 2013-01-07 MED ORDER — SODIUM CHLORIDE 0.9 % IV SOLN
1000.0000 ug | INTRAVENOUS | Status: DC | PRN
  Administered 2013-01-07: 2 ug/min via INTRAVENOUS

## 2013-01-07 MED ORDER — ASPIRIN EC 81 MG PO TBEC
81.0000 mg | DELAYED_RELEASE_TABLET | Freq: Every day | ORAL | Status: DC
  Administered 2013-01-07: 81 mg via ORAL
  Filled 2013-01-07: qty 1

## 2013-01-07 MED ORDER — FENTANYL CITRATE 0.05 MG/ML IJ SOLN: 25.0000 ug | INTRAMUSCULAR | Status: DC | PRN

## 2013-01-07 NOTE — Anesthesia Preprocedure Evaluation (Addendum)
Anesthesia Evaluation  Patient identified by MRN, date of birth, ID band Patient awake    Reviewed: Allergy & Precautions, H&P , NPO status , Patient's Chart, lab work & pertinent test results  History of Anesthesia Complications Negative for: history of anesthetic complications  Airway Mallampati: I TM Distance: >3 FB Neck ROM: Full    Dental  (+) Teeth Intact and Poor Dentition   Pulmonary asthma , sleep apnea ,  Only as a child breath sounds clear to auscultation        Cardiovascular hypertension, + CAD and +CHF + dysrhythmias Ventricular Tachycardia Rhythm:Irregular Rate:Normal  Echo noted, diffuse hypokineses, EF 30% No valvular abnormalities   Neuro/Psych    GI/Hepatic   Endo/Other  diabetes, Type 2, Oral Hypoglycemic Agents  Renal/GU      Musculoskeletal   Abdominal (+) + obese,   Peds  Hematology   Anesthesia Other Findings   Reproductive/Obstetrics                         Anesthesia Physical Anesthesia Plan  ASA: III  Anesthesia Plan: General and MAC   Post-op Pain Management:    Induction: Intravenous  Airway Management Planned: LMA  Additional Equipment: Arterial line  Intra-op Plan:   Post-operative Plan: Extubation in OR  Informed Consent:   Dental advisory given  Plan Discussed with: CRNA and Surgeon  Anesthesia Plan Comments:        Anesthesia Quick Evaluation

## 2013-01-07 NOTE — Anesthesia Postprocedure Evaluation (Signed)
  Anesthesia Post-op Note  Patient: [name]  Procedure(s) Performed: Procedure(s): V-TACH ABLATION (N/A)  Patient Location: Cath Lab  Anesthesia Type:MAC  Level of Consciousness: awake and alert   Airway and Oxygen Therapy: Patient Spontanous Breathing  Post-op Pain: none  Post-op Assessment: Post-op Vital signs reviewed  Post-op Vital Signs: stable  Complications: No apparent anesthesia complications

## 2013-01-07 NOTE — Brief Op Note (Signed)
01/07/2013  10:16 AM   PVC ablation performed  PVCs ablated from the anteroseptal RVOT  No early apparent complications

## 2013-01-07 NOTE — H&P (View-Only) (Signed)
 Primary Care Physician: Valerie Leschber, MD Referring Physician:  Dr Bensimhon   [name] is a 57 y.o. male with a h/o nonischemic cardiomyopathy and very frequent PVCs who presents today for EP follow-up.  The patient has had a nonischemic cardmiomyopathy for several years.  He is followed by Dr Bensimhon in the advanced heart failure clinic.  He is noted to have very frequent PVCs.  His PVCs are felt to be contributing to low EF/ symptoms of fatigue and decreased exercise tolerance.  He also reports occasional "fluttering" in his chest.   He presented to the EP lab 10/18/12 but unfortunately was not having PVCs in the lab.  I therefore could not perform ablation.  Today, he denies symptoms of chest pain,  orthopnea, PND, lower extremity edema, dizziness, presyncope, syncope, or neurologic sequela. The patient is tolerating medications without difficulties and is otherwise without complaint today.   Past Medical History  Diagnosis Date  . Asthma   . CAD     nonobst cath 2004  . CARDIOMYOPATHY     NICM, LVEF 30% 01/2011 echo; 20-25% 07/2012 echo  . DIABETES MELLITUS, TYPE II   . HYPERCHOLESTEROLEMIA   . HYPERTENSION   . PSA, INCREASED   . Noncompliance   . CHF (congestive heart failure)    Past Surgical History  Procedure Laterality Date  . Cardiac catheterization  2004    nonobst dz  . Surgery scrotal / testicular      at age 14    Current Outpatient Prescriptions  Medication Sig Dispense Refill  . aspirin EC 81 MG tablet Take 81 mg by mouth daily.      . carvedilol (COREG) 12.5 MG tablet Take 0.5 tablets (6.25 mg total) by mouth 2 (two) times daily with a meal.  30 tablet  5  . furosemide (LASIX) 40 MG tablet Take 1 tablet (40 mg total) by mouth daily.  30 tablet  6  . glimepiride (AMARYL) 2 MG tablet Take 1 tablet (2 mg total) by mouth daily before breakfast.  30 tablet  6  . hydrALAZINE (APRESOLINE) 50 MG tablet Take 1 tablet (50 mg total) by mouth 3 (three) times  daily.  90 tablet  3  . isosorbide mononitrate (IMDUR) 30 MG 24 hr tablet Take 2 tablets (60 mg total) by mouth daily.  60 tablet  3  . lisinopril (PRINIVIL,ZESTRIL) 5 MG tablet Take 1 tablet (5 mg total) by mouth daily.  30 tablet  6   No current facility-administered medications for this visit.    Allergies  Allergen Reactions  . Ambien [Zolpidem Tartrate] Other (See Comments)    "Went berserk."    History   Social History  . Marital Status: Single    Spouse Name: N/A    Number of Children: N/A  . Years of Education: N/A   Occupational History  . Health services    Social History Main Topics  . Smoking status: Never Smoker   . 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 Alcohol Use: No     Comment: rare  . Drug Use: No  . Sexual Activity: No   Other Topics Concern  . Not on file   Social History Narrative   Single - divorced x 3 -   Lives with 2 sons, enjoys spending time with 6 g-kids and 3 kids    Family History  Problem Relation Age of Onset  .   Peripheral vascular disease    . Hypertension    . Prostate cancer    . Hypertension Mother   . Peripheral vascular disease Mother   . Prostate cancer Brother     ROS- All systems are reviewed and negative except as per the HPI above  Physical Exam: Filed Vitals:   12/24/12 1445  BP: 160/84  Pulse: 69  Height: 5' 5" (1.651 m)  Weight: 188 lb 12.8 oz (85.639 kg)    GEN- The patient is well appearing, alert and oriented x 3 today.   Head- normocephalic, atraumatic Eyes-  Sclera clear, conjunctiva pink Ears- hearing intact Oropharynx- clear Neck- supple, no JVP Lymph- no cervical lymphadenopathy Lungs- Clear to ausculation bilaterally, normal work of breathing Heart- Regular rate and rhythm, no murmurs, rubs or gallops, PMI not laterally displaced GI- soft, NT, ND, + BS Extremities- no clubbing, cyanosis, or edema MS- no significant deformity or  atrophy Skin- no rash or lesion Psych- euthymic mood, full affect Neuro- strength and sensation are intact  EKG 09/15/12 reveals sinus with bigeminal PVCs (LBB inferior axis) Echo reviewed Dr Bensimhons notes are reviewed  Assessment and Plan:  1. PVCs The patient has a very high burden of PVCs.  These appear to be likely outflow tract in origin.  He has failed medical therapy with beta blockers.  Unfortunately he did not have PVCs when I brought him to the EP lab before.  Today, he has PVCs on ekg.  We discussed the possibility of a repeat attempt at ablation.    Therapeutic strategies for PVCs including medicine and ablation were discussed in detail with the patient today. Risk, benefits, and alternatives to EP study and radiofrequency ablation were also discussed in detail today. These risks include but are not limited to stroke, bleeding, vascular damage, tamponade, perforation, damage to the heart and other structures, AV block requiring pacemaker, worsening renal function, and death. The patient understands these risk and wishes to proceed.  We will therefore proceed with catheter ablation at the next available time.  He is instructed to hold his coreg for 48 hours prior to ablation.  2. Nonischemic CM Proceed with ablation as above Will need to re-evaluate his EF post ablation.  If his EF remains depressed then he may need to be considered for ICD implant though I am optimistic that his EF will improve.  3. HTN Stable No change required today  

## 2013-01-07 NOTE — Transfer of Care (Signed)
Immediate Anesthesia Transfer of Care Note  Patient: [name]  Procedure(s) Performed: Procedure(s): V-TACH ABLATION (N/A)  Patient Location: PACU  Anesthesia Type:MAC  Level of Consciousness: awake, alert , oriented, patient cooperative and responds to stimulation  Airway & Oxygen Therapy: Patient Spontanous Breathing and Patient connected to nasal cannula oxygen  Post-op Assessment: Report given to PACU RN, Post -op Vital signs reviewed and stable and Patient moving all extremities X 4  Post vital signs: Reviewed and stable  Complications: No apparent anesthesia complications

## 2013-01-07 NOTE — Interval H&P Note (Signed)
History and Physical Interval Note:  01/07/2013 7:33 AM  [name]  has presented today for surgery, with the diagnosis of V-TACH  The various methods of treatment have been discussed with the patient and family. After consideration of risks, benefits and other options for treatment, the patient has consented to  Procedure(s): V-TACH ABLATION (N/A) as a surgical intervention .  The patient's history has been reviewed, patient examined, no change in status, stable for surgery.  I have reviewed the patient's chart and labs.  Questions were answered to the patient's satisfaction.     Hillis Range

## 2013-01-07 NOTE — Discharge Summary (Signed)
ELECTROPHYSIOLOGY PROCEDURE DISCHARGE SUMMARY    Patient ID: [name],  MRN: [id number], DOB/AGE: 07/30/1955 57 y.o.  Admit date: 01/07/2013 Discharge date: 01/09/2013  Primary Care Physician: Bradley Paci, MD Primary Cardiologist: Bradley Mango, MD Electrophysiologist: Bradley Range, MD  Primary Discharge Diagnosis:  Non ischemic cardiomyopathy and PVC's status post electrophysiology study and ablation this admission  Secondary Discharge Diagnosis:  1.  Asthma 2.  Nonobstructive coronary artery disease 3.  Hypertension 4.  Type II diabetes  Allergies  Allergen Reactions  . Ambien [Zolpidem Tartrate] Other (See Comments)    "Went berserk."     Procedures This Admission:  1.  Electrophysiology study and radiofrequency catheter ablation of PVC's on 01-07-2013 by Dr [name].  See his dictated op note for full details.  There were no early apparent complications.   Brief HPI: [name] is a 57 year old male with a past medical history significant for non ischemic cardiomyopathy and frequent ventricular ectopy.  He was referred to EP in the outpatient setting for treatment options.  He presented to the EP lab in September of this year but at that time was not having enough PVC's to map.  Due to continued high ectopy burden, it was recommended to attempt repeat ablation.  Risks, benefits, and alternatives were reviewed with the patient who wished to proceed.  Hospital Course:  The patient was admitted and underwent EPS/RFCA of his PVC's in the anteroseptal RVOT.  He was monitored on telemetry throughout his bedrest which demonstrated sinus rhythm.  His groin was without complication after bedrest and ambulation.  He was felt stable for discharge with planned outpatient follow up in 4 weeks with Dr [name].   Discharge Vitals: Blood pressure 150/71, pulse 69, temperature 98.3 F (36.8 C), temperature source Oral, resp. rate 20, SpO2 98.00%.   Labs:   Lab Results    Component Value Date   WBC 6.5 12/31/2012   HGB 13.0 12/31/2012   HCT 39.0 12/31/2012   MCV 89.9 12/31/2012   PLT 272.0 12/31/2012   No results found for this basename: NA, K, CL, CO2, BUN, CREATININE, CALCIUM, LABALBU, PROT, BILITOT, ALKPHOS, ALT, AST, GLUCOSE,  in the last 168 hours   Discharge Medications:    Medication List         aspirin EC 81 MG tablet  Take 81 mg by mouth daily.     carvedilol 12.5 MG tablet  Commonly known as:  COREG  Take 0.5 tablets (6.25 mg total) by mouth 2 (two) times daily with a meal.     furosemide 40 MG tablet  Commonly known as:  LASIX  Take 1 tablet (40 mg total) by mouth daily.     glimepiride 2 MG tablet  Commonly known as:  AMARYL  Take 1 tablet (2 mg total) by mouth daily before breakfast.     hydrALAZINE 50 MG tablet  Commonly known as:  APRESOLINE  Take 1 tablet (50 mg total) by mouth 3 (three) times daily.     isosorbide mononitrate 30 MG 24 hr tablet  Commonly known as:  IMDUR  Take 2 tablets (60 mg total) by mouth daily.     lisinopril 5 MG tablet  Commonly known as:  PRINIVIL,ZESTRIL  Take 1 tablet (5 mg total) by mouth daily.        Disposition:   Future Appointments Provider Department Dept Phone   02/16/2013 9:45 AM Bradley Range, MD [facility] [place] [facility] [phone]   02/17/2013 10:40  AM [facility] [phone]     Follow-up Information   Follow up with Bradley Range, MD On 02/17/2013. (9:45 AM)    Specialty:  Cardiology   Contact information:   [address] [place] [address] [phone]       Duration of Discharge Encounter: Greater than 30 minutes including physician time.  Signed,  Bradley Range MD

## 2013-01-07 NOTE — Preoperative (Signed)
Beta Blockers   Reason not to administer Beta Blockers:Not Applicable, pt instructed not to take any BP meds including Coreg per sx

## 2013-01-08 DIAGNOSIS — I472 Ventricular tachycardia: Secondary | ICD-10-CM

## 2013-01-08 NOTE — Op Note (Signed)
NAMESHILOH, [name] NO.:  [id number]  MEDICAL RECORD NO.:  [id number]  LOCATION:  3W09C                        FACILITY:  [facility]  PHYSICIAN:  [name], [name]       DATE OF BIRTH:  06/07/1955  DATE OF PROCEDURE:  01/07/2013 DATE OF DISCHARGE:  01/07/2013                              OPERATIVE REPORT   PREPROCEDURE DIAGNOSES: 1. Premature ventricular contractions. 2. 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  POSTPROCEDURE DIAGNOSES: 1. Premature ventricular contractions. 2. 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  PROCEDURES: 1. Comprehensive EP study. 2. Coronary sinus pacing and recording. 3. Three-dimensional mapping of ventricular tachycardia. 4. Radiofrequency ablation of ventricular tachycardia. 5. Isoproterenol infusion with arrhythmia induction.  INTRODUCTION:  [name] is a pleasant 57 year old gentleman with a history of a nonischemic cardiomyopathy.  He has been documented to have very frequent premature ventricular contractions.  His PVCs are of a left bundle-branch inferior axis morphology with a transition at V3 with a QRS duration of 223 milliseconds.  His PVCs were infrequent today.  He has previously failed medical therapy and therefore presents for ablation.  DESCRIPTION OF PROCEDURE:  Informed written consent was obtained and the patient was brought to the Electrophysiology Lab in the fasting state. He was adequately sedated with intravenous medications as outlined in the anesthesia report.  The patient's right groin was prepped and draped in the usual sterile fashion by the EP lab staff.  Using a percutaneous Seldinger technique, one 6, one 7, and one 8-French hemostasis sheaths were placed in the right common femoral vein.  A 6-French decapolar Biosense  Webster catheter was introduced through the right common femoral vein and advanced into the coronary sinus for recording and pacing from this location.  A 6-French quadripolar Josephson catheter was introduced through the right common femoral vein and advanced into the right ventricle for recording and pacing.  This catheter was then pulled back to the His bundle location.  The patient presented to the Electrophysiology Lab in sinus rhythm.  His PR interval measured 152 milliseconds with a QRS duration of 130 milliseconds and a QT interval of 440 milliseconds.  His average RR interval measured 622 milliseconds. His AH interval measured 85 milliseconds with an HV interval of 55 milliseconds.  Rapid atrial pacing was performed which revealed no evidence of PR greater than RR.  The AV Wenckebach cycle length was 360 milliseconds with no arrhythmias induced with atrial pacing. Ventricular pacing was performed which revealed VA dissociation at baseline when pacing at 600 milliseconds.  Isoproterenol was infused up to 4 mcg/minute.  During isoproterenol infusion as well as wash out, the patient was really not noted to have any significant increase in his PVCs which were quite rare during the procedure today.  I elected to perform pace mapping today.  A 7-French Biosense Webster 4-mm EZ Steer ablation catheter was introduced through the right common femoral vein and advanced into the right ventricle.  An anatomical map of the right ventricle and right ventricular outflow tract was performed.  This demonstrated that  the right ventricular outflow tract was rather large. He appeared to have a anteroseptal region which was suspicious for a small diverticula or outpouching.  Activation mapping was performed during PVCs.  In the anteroseptal right ventricular outflow tract at the area of the small outpouching, the activation proceeded the surface QRS by 27 milliseconds.  Pace mapping was performed and  revealed a 97.4% pace map match.  This was confirmed by rhythms with both the Bard recording system as well as the [employer].  A series of seven radiofrequency applications were delivered at 40 watts with a target temperature of 50 degrees for between 45 seconds and 2 minutes in duration.  Initially with radiofrequency current, there was a flurry of PVCs and then subsequently the patient became quiescent.  Following ablation, the patient was observed with only very rare PVCs.  Following ablation, the AH interval measured 77 milliseconds with an HV interval of 52 milliseconds.  The procedure was therefore considered completed. All catheters were removed and the sheaths were aspirated and flushed. The sheaths were removed and hemostasis was assured.  There were no early apparent complications.  CONCLUSIONS: 1. Sinus rhythm with premature ventricular contractions, arising from     the anteroseptal right ventricular outflow tract upon presentation. 2. Successful radiofrequency ablation of the right ventricular outflow     tract premature ventricular contractions along the anteroseptal     right ventricular outflow tract.  This was a distal portion of the     outflow tract and along a small diverticula. 3. No inducible arrhythmias today with no evidence of accessory     pathways or dual AV nodal physiology. 4. No early apparent complications.     [name], [name]     JA/MEDQ  D:  01/07/2013  T:  01/08/2013  Job:  [id number]  cc:   Bradley Buckles. [name], [name]

## 2013-01-10 ENCOUNTER — Encounter (HOSPITAL_COMMUNITY): Payer: Self-pay | Admitting: *Deleted

## 2013-01-14 ENCOUNTER — Encounter: Payer: Self-pay | Admitting: Internal Medicine

## 2013-01-26 ENCOUNTER — Ambulatory Visit (HOSPITAL_COMMUNITY)
Admission: RE | Admit: 2013-01-26 | Discharge: 2013-01-26 | Disposition: A | Discharge status: Home / Self Care | Payer: BC Managed Care – PPO | Source: Ambulatory Visit | Attending: Adult Health | Admitting: Adult Health

## 2013-01-26 DIAGNOSIS — I5022 Chronic systolic (congestive) heart failure: Secondary | ICD-10-CM

## 2013-01-26 DIAGNOSIS — I428 Other cardiomyopathies: Secondary | ICD-10-CM

## 2013-01-26 DIAGNOSIS — I517 Cardiomegaly: Secondary | ICD-10-CM | POA: Insufficient documentation

## 2013-01-26 DIAGNOSIS — I509 Heart failure, unspecified: Secondary | ICD-10-CM | POA: Insufficient documentation

## 2013-01-26 LAB — CREATININE, SERUM
Creatinine, Ser: 1.43 mg/dL — ABNORMAL HIGH (ref 0.50–1.35)
GFR calc Af Amer: 61 mL/min — ABNORMAL LOW
GFR calc non Af Amer: 53 mL/min — ABNORMAL LOW

## 2013-01-26 MED ORDER — GADOBENATE DIMEGLUMINE 529 MG/ML IV SOLN
26.0000 mL | Freq: Once | INTRAVENOUS | Status: AC
  Administered 2013-01-26: 26 mL via INTRAVENOUS

## 2013-02-03 ENCOUNTER — Telehealth (HOSPITAL_COMMUNITY): Payer: Self-pay | Admitting: Adult Health

## 2013-02-03 NOTE — Telephone Encounter (Signed)
Provided with cardiac MRI results.   Cardiac MRI - EF improved 40%. NICM. Continue to follow in HF clinic to titrate meds.   Mr Yuniel verbalized understanding.   CLEGG,AMY NP-C 4:20 PM

## 2013-02-16 ENCOUNTER — Ambulatory Visit (INDEPENDENT_AMBULATORY_CARE_PROVIDER_SITE_OTHER): Payer: BC Managed Care – PPO | Admitting: Internal Medicine

## 2013-02-16 ENCOUNTER — Encounter: Payer: Self-pay | Admitting: Internal Medicine

## 2013-02-16 ENCOUNTER — Encounter (INDEPENDENT_AMBULATORY_CARE_PROVIDER_SITE_OTHER): Payer: Self-pay

## 2013-02-16 VITALS — BP 126/84 | HR 71 | Ht 65.0 in | Wt 186.5 lb

## 2013-02-16 DIAGNOSIS — I10 Essential (primary) hypertension: Secondary | ICD-10-CM

## 2013-02-16 DIAGNOSIS — I5022 Chronic systolic (congestive) heart failure: Secondary | ICD-10-CM

## 2013-02-16 DIAGNOSIS — I428 Other cardiomyopathies: Secondary | ICD-10-CM

## 2013-02-16 DIAGNOSIS — I4949 Other premature depolarization: Secondary | ICD-10-CM

## 2013-02-16 MED ORDER — CARVEDILOL 12.5 MG PO TABS: 12.5000 mg | ORAL_TABLET | Freq: Two times a day (BID) | ORAL | Status: DC

## 2013-02-16 NOTE — Patient Instructions (Signed)
Your physician recommends that you schedule a follow-up appointment in: 6 weeks with Dr Rayann Heman  Your physician has recommended you make the following change in your medication:  1) Increase Carvedilol to 12.5mg  twice daily  Your physician has recommended that you wear a holter monitor. Holter monitors are medical devices that record the heart's electrical activity. Doctors most often use these monitors to diagnose arrhythmias. Arrhythmias are problems with the speed or rhythm of the heartbeat. The monitor is a small, portable device. You can wear one while you do your normal daily activities. This is usually used to diagnose what is causing palpitations/syncope (passing out).---24 hour

## 2013-02-16 NOTE — Progress Notes (Signed)
Primary Care Physician: Gwendolyn Grant, MD Referring Physician:  Dr Samara Snide is a 58 y.o. male with a h/o nonischemic cardiomyopathy and very frequent PVCs who presents today for EP follow-up.  He is s/p recent ablation of PVCs.  He was noted to have a dominant focus along there anteroseptal RVOT in the area of a small diverticulum.  He has done well since, without procedure related complications. Today, he denies symptoms of chest pain,  orthopnea, PND, lower extremity edema, dizziness, presyncope, syncope, or neurologic sequela. The patient is tolerating medications without difficulties and is otherwise without complaint today.   Past Medical History  Diagnosis Date  . Asthma   . CAD     nonobst cath 2004  . CARDIOMYOPATHY     NICM, LVEF 30% 01/2011 echo; 20-25% 07/2012 echo  . DIABETES MELLITUS, TYPE II   . HYPERCHOLESTEROLEMIA   . HYPERTENSION   . PSA, INCREASED   . Noncompliance   . CHF (congestive heart failure)   . PVCs (premature ventricular contractions)     s/p ablation 01-07-2013 by Dr Rayann Heman   Past Surgical History  Procedure Laterality Date  . Cardiac catheterization  2004    nonobst dz  . Surgery scrotal / testicular      at age 16  . Ablation  01-07-2013    RVOT PVC's ablated by Dr Rayann Heman along anteroseptal RVOT    Current Outpatient Prescriptions  Medication Sig Dispense Refill  . aspirin EC 81 MG tablet Take 81 mg by mouth daily.      . carvedilol (COREG) 12.5 MG tablet Take 0.5 tablets (6.25 mg total) by mouth 2 (two) times daily with a meal.  30 tablet  5  . furosemide (LASIX) 40 MG tablet Take 1 tablet (40 mg total) by mouth daily.  30 tablet  6  . glimepiride (AMARYL) 2 MG tablet Take 1 tablet (2 mg total) by mouth daily before breakfast.  30 tablet  6  . hydrALAZINE (APRESOLINE) 50 MG tablet Take 1 tablet (50 mg total) by mouth 3 (three) times daily.  90 tablet  6  . isosorbide mononitrate (IMDUR) 30 MG 24 hr tablet Take 2 tablets  (60 mg total) by mouth daily.  60 tablet  6  . lisinopril (PRINIVIL,ZESTRIL) 5 MG tablet Take 1 tablet (5 mg total) by mouth daily.  30 tablet  6   No current facility-administered medications for this visit.    Allergies  Allergen Reactions  . Ambien [Zolpidem Tartrate] Other (See Comments)    "Went berserk."    History   Social History  . Marital Status: Single    Spouse Name: N/A    Number of Children: N/A  . Years of Education: N/A   Occupational History  . Health services    Social History Main Topics  . Smoking status: Never Smoker   . 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 Alcohol Use: No     Comment: rare  . Drug Use: No  . Sexual Activity: No   Other Topics Concern  . Not on file   Social History Narrative   Single - divorced x 3 -   Lives with 2 sons, enjoys spending time with 6 g-kids and 3 kids    Family History  Problem Relation Age of Onset  . Peripheral vascular disease    . Hypertension    . Prostate cancer    .  Hypertension Mother   . Peripheral vascular disease Mother   . Prostate cancer Brother     ROS- All systems are reviewed and negative except as per the HPI above  Physical Exam: Filed Vitals:   02/16/13 0956  BP: 126/84  Pulse: 71  Height: 5\' 5"  (1.651 m)  Weight: 186 lb 8 oz (84.596 kg)    GEN- The patient is well appearing, alert and oriented x 3 today.   Head- normocephalic, atraumatic Eyes-  Sclera clear, conjunctiva pink Ears- hearing intact Oropharynx- clear Neck- supple, no JVP Lymph- no cervical lymphadenopathy Lungs- Clear to ausculation bilaterally, normal work of breathing Heart- Regular rate and rhythm, no murmurs, rubs or gallops, PMI not laterally displaced GI- soft, NT, ND, + BS Extremities- no clubbing, cyanosis, or edema MS- no significant deformity or atrophy Skin- no rash or lesion Psych- euthymic mood, full affect Neuro- strength and  sensation are intact  Assessment and Plan:  1. PVCs The patient returns post PVC ablation.  His ekg today is reviewed and reveals sinus with with frequent PVCs.  PVCs are of a LBB inferior axis with transition at V3. I will increase coreg to 12.5mg  BID today. Repeat 24 hour holter to evaluate for any change in pvc burden Return for follow-up in 6 weeks  2. Nonischemic CM stable  3. HTN Stable No change required today

## 2013-02-16 NOTE — Addendum Note (Signed)
Addended by: Janan Halter F on: 02/16/2013 10:55 AM   Modules accepted: Orders

## 2013-02-17 ENCOUNTER — Encounter (HOSPITAL_COMMUNITY): Payer: Self-pay

## 2013-02-17 ENCOUNTER — Encounter (INDEPENDENT_AMBULATORY_CARE_PROVIDER_SITE_OTHER): Payer: BC Managed Care – PPO

## 2013-02-17 ENCOUNTER — Ambulatory Visit (HOSPITAL_COMMUNITY)
Admission: RE | Admit: 2013-02-17 | Discharge: 2013-02-17 | Disposition: A | Discharge status: Home / Self Care | Payer: BC Managed Care – PPO | Source: Ambulatory Visit | Attending: Internal Medicine | Admitting: Internal Medicine

## 2013-02-17 ENCOUNTER — Encounter: Payer: Self-pay | Admitting: *Deleted

## 2013-02-17 VITALS — BP 128/72 | HR 70 | Wt 185.8 lb

## 2013-02-17 DIAGNOSIS — I4949 Other premature depolarization: Secondary | ICD-10-CM

## 2013-02-17 DIAGNOSIS — I5022 Chronic systolic (congestive) heart failure: Secondary | ICD-10-CM | POA: Insufficient documentation

## 2013-02-17 MED ORDER — LISINOPRIL 10 MG PO TABS: 10.0000 mg | ORAL_TABLET | Freq: Every day | ORAL | Status: DC

## 2013-02-17 NOTE — Patient Instructions (Addendum)
Take lisinopril 10 mg daily    Follow up 1 month with an ECHO  Do the following things EVERYDAY: 1) Weigh yourself in the morning before breakfast. Write it down and keep it in a log. 2) Take your medicines as prescribed 3) Eat low salt foods-Limit salt (sodium) to 2000 mg per day.  4) Stay as active as you can everyday 5) Limit all fluids for the day to less than 2 liters

## 2013-02-17 NOTE — Progress Notes (Signed)
Patient ID: [name], male   DOB: 11-02-1955, 58 y.o.   MRN: [id number]   Weight Range  182 pounds  Baseline proBNP    PCP: Dr Asa Lente HPI: [name] is a 58 year old with a history of HTN, PVCs, chronic systolic heart failure, last cardiac cath 2005 Nonobstructive coronary artery disease (first diagonal 25% stenosis, ramus intermedius 25% stenosis) and borderline DM2.   2009 ECHO EF 45% 2013 ECHO EF 25%   07/30/12 ECHO EF 20-25% 10/14 ECHO EF 30% with frequent PVCs, mild LV dilation, mild LVH.   Admitted to [facility] 07/30/12 through 08/02/12 with mild acute/chronic systolic heart failure in setting on medication noncompliance. CEs negative. CKD noted 1.55. pBNP 320 (previous 1200)  He has been out of his medications 2-3 months. He diuresed 2 pounds and was discharged. Discharge weight 178 pounds. Previous spiro and ace stopped due to CRI. Bidil started.  08/25/12 48 hour Holter Monitor- 29% Monomorphic  PVCs referred to Dr Rayann Heman.  He was taken for PVC mapping to be followed by PVC ablation, but at that time, he was not having enough PVCs to map so no ablation was done.   He returns for follow up. On 01/07/13 he had PVC ablation by Dr Rayann Heman. He saw Dr Rayann Heman yesterday and he increased his carvedilol to 12.5 mg twice a day and 24 hour holter monitor was placed. He has to take breaks walking to the back of a store. SOB going up steps. Weight at home 185 pounds. He is working full time with mentally challenged adults.     08/09/12 Potassium 3.6 Creatinine 1.7 09/15/12 pro-BNP 604 09/22/12 Potassium 3.5 , Creatinine 1.5 10/28/12 K 3.8 Creatinine 1.3 11/24/12 Pro BNP 478 12/31/12 K 3.9 Creatinine 1.4    ROS: All systems negative except as listed in HPI, PMH and Problem List.  Past Medical History  Diagnosis Date  . Asthma   . CAD     nonobst cath 2004  . CARDIOMYOPATHY     NICM, LVEF 30% 01/2011 echo; 20-25% 07/2012 echo  . DIABETES MELLITUS, TYPE II   . HYPERCHOLESTEROLEMIA   . HYPERTENSION    . PSA, INCREASED   . Noncompliance   . CHF (congestive heart failure)   . PVCs (premature ventricular contractions)     s/p ablation 01-07-2013 by Dr Rayann Heman    Current Outpatient Prescriptions  Medication Sig Dispense Refill  . aspirin EC 81 MG tablet Take 81 mg by mouth daily.      . carvedilol (COREG) 12.5 MG tablet Take 1 tablet (12.5 mg total) by mouth 2 (two) times daily with a meal.  60 tablet  11  . furosemide (LASIX) 40 MG tablet Take 1 tablet (40 mg total) by mouth daily.  30 tablet  6  . glimepiride (AMARYL) 2 MG tablet Take 1 tablet (2 mg total) by mouth daily before breakfast.  30 tablet  6  . hydrALAZINE (APRESOLINE) 50 MG tablet Take 1 tablet (50 mg total) by mouth 3 (three) times daily.  90 tablet  6  . isosorbide mononitrate (IMDUR) 30 MG 24 hr tablet Take 2 tablets (60 mg total) by mouth daily.  60 tablet  6  . lisinopril (PRINIVIL,ZESTRIL) 5 MG tablet Take 1 tablet (5 mg total) by mouth daily.  30 tablet  6   No current facility-administered medications for this encounter.    Filed Vitals:   02/17/13 1104  BP: 128/72  Pulse: 70  Weight: 185 lb 12.8 oz (84.278  kg)  SpO2: 98%   PHYSICAL EXAM General:  Well appearing. No resp difficulty HEENT: normal Neck: supple. JVP flat. Carotids 2+ bilaterally; no bruits. No lymphadenopathy or thryomegaly appreciated. Cor: PMI normal. Irregular rate & rhythm . No rubs, gallops or murmurs. Lungs: clear Abdomen: soft, nontender, nondistended. No hepatosplenomegaly. No bruits or masses. Good bowel sounds. Extremities: no cyanosis, clubbing, rash, edema Neuro: alert & orientedx3, cranial nerves grossly intact. Moves all 4 extremities w/o difficulty. Affect pleasant.   ASSESSMENT & PLAN: 1. Chronic Systolic Heart Failure: NICM, cardiac cath 2005 with nonobstructive coronary artery disease (first diagonal 25% stenosis, ramus intermedius 25% stenosis)  07/30/12 ECHO EF 20-25% repeat ECHO 11/24/12 EF ~30% with frequent PVCs.   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   NYHA II-III symptoms. Volume status stable.   Continue lasix 40 mg daily.  - Continue carveidlol 12.5 mg twice a day. He has not tolerated uptitration in the past.  - Increase  Lisinopril 10 mg daily. Not on spiro yet.. Check BMET in 10 days.  - Continue hydralazine 50 mg TID and IMDUR to 60 mg daily.  - Reinforced the need and importance of daily weights, a low sodium diet, and fluid restriction (less than 2 L a day). Instructed to call the HF clinic if weight increases more than 3 lbs overnight or 5 lbs in a week.  2. HTN: Stable Continue hydralazine to 50 mg tid/ Imdur 60 mg daily, lisinopril 5 mg daily, and carvedilol 12.5 mg twice a day. 3. Snores: Sleep study completed. Recommendation to try CPAP but he refused.   4. CKD, stage III:  Creatinine baseline  1.5-1.7 5. S/P PVC ablation 01/07/13.  24 hour monitor to be placed today per Dr Rayann Heman   Follow up in 1 month with an ECHO  CLEGG,AMY NP-C 11:10 AM  Patient seen and examined with Darrick Grinder, NP. We discussed all aspects of the encounter. I agree with the assessment and plan as stated above.   Doing well from functional standpoint but EF remains depressed and still with frequent PVCs despite ablation. Volume status looks good. Will attempt to increase lisinopril. Dr. Rayann Heman placing monitor and following for need for ICD. Refuses CPAP.   Delpha Perko,MD 8:04 PM

## 2013-02-17 NOTE — Progress Notes (Unsigned)
Patient ID: [name], male   DOB: November 28, 1955, 58 y.o.   MRN: [id number] E-Cardio 24 hour holter monitor applied to patient.

## 2013-03-21 ENCOUNTER — Ambulatory Visit (HOSPITAL_BASED_OUTPATIENT_CLINIC_OR_DEPARTMENT_OTHER)
Admission: RE | Admit: 2013-03-21 | Discharge: 2013-03-21 | Disposition: A | Discharge status: Home / Self Care | Payer: BC Managed Care – PPO | Source: Ambulatory Visit | Attending: Internal Medicine | Admitting: Internal Medicine

## 2013-03-21 ENCOUNTER — Ambulatory Visit (HOSPITAL_COMMUNITY)
Admission: RE | Admit: 2013-03-21 | Discharge: 2013-03-21 | Disposition: A | Discharge status: Home / Self Care | Payer: BC Managed Care – PPO | Source: Ambulatory Visit | Attending: Internal Medicine | Admitting: Internal Medicine

## 2013-03-21 VITALS — BP 158/78 | HR 71 | Wt 188.0 lb

## 2013-03-21 DIAGNOSIS — I517 Cardiomegaly: Secondary | ICD-10-CM | POA: Insufficient documentation

## 2013-03-21 DIAGNOSIS — I059 Rheumatic mitral valve disease, unspecified: Secondary | ICD-10-CM

## 2013-03-21 DIAGNOSIS — I5022 Chronic systolic (congestive) heart failure: Secondary | ICD-10-CM

## 2013-03-21 DIAGNOSIS — I4949 Other premature depolarization: Secondary | ICD-10-CM

## 2013-03-21 DIAGNOSIS — E119 Type 2 diabetes mellitus without complications: Secondary | ICD-10-CM | POA: Insufficient documentation

## 2013-03-21 DIAGNOSIS — I10 Essential (primary) hypertension: Secondary | ICD-10-CM | POA: Insufficient documentation

## 2013-03-21 DIAGNOSIS — I509 Heart failure, unspecified: Secondary | ICD-10-CM | POA: Insufficient documentation

## 2013-03-21 NOTE — Progress Notes (Signed)
Patient ID: [name], male   DOB: 12-30-1955, 58 y.o.   MRN: [id number]   Weight Range  182 pounds  Baseline proBNP    PCP: Dr Asa Lente HPI: [name] is a 58 year old with a history of HTN, PVCs, chronic systolic heart failure, last cardiac cath 2005 Nonobstructive coronary artery disease (first diagonal 25% stenosis, ramus intermedius 25% stenosis) and borderline DM2.   2009 ECHO EF 45% 2013 ECHO EF 25%   07/30/12 ECHO EF 20-25% 10/14 ECHO EF 30% with frequent PVCs, mild LV dilation, mild LVH.  03/21/13 ECHO prelimeiary EF 40-45% LVIDd 2mm-> 29mm  Admitted to [facility] 07/30/12 through 08/02/12 with mild acute/chronic systolic heart failure in setting on medication noncompliance. CEs negative. CKD noted 1.55. pBNP 320 (previous 1200)  He has been out of his medications 2-3 months. He diuresed 2 pounds and was discharged. Discharge weight 178 pounds. Previous spiro and ace stopped due to CRI. Bidil started.  08/25/12 48 hour Holter Monitor- 29% Monomorphic  PVCs referred to Dr Rayann Heman.  He was taken for PVC mapping to be followed by PVC ablation, but at that time, he was not having enough PVCs to map so no ablation was done.   On 01/07/13 he had second PVC ablation by Dr Rayann Heman. He saw Dr Rayann Heman yesterday and he increased his carvedilol to 12.5 mg . Wore 24 hour monitor 02/19/13. Shows decreased PVC burden.    He returns for follow up. Denies SOB/PND/Orthopnea. Says he can walk up steps. Does admit to fatigue after walking. Weight at home 185-188 pounds. Eating high salt foods such as chips. He is working full time with mentally challenged adults. Not exercising.     08/09/12 Potassium 3.6 Creatinine 1.7 09/15/12 pro-BNP 604 09/22/12 Potassium 3.5 , Creatinine 1.5 10/28/12 K 3.8 Creatinine 1.3 11/24/12 Pro BNP 478 12/31/12 K 3.9 Creatinine 1.4  01/26/14 1.4    ROS: All systems negative except as listed in HPI, PMH and Problem List.  Past Medical History  Diagnosis Date  . Asthma   . CAD      nonobst cath 2004  . CARDIOMYOPATHY     NICM, LVEF 30% 01/2011 echo; 20-25% 07/2012 echo  . DIABETES MELLITUS, TYPE II   . HYPERCHOLESTEROLEMIA   . HYPERTENSION   . PSA, INCREASED   . Noncompliance   . CHF (congestive heart failure)   . PVCs (premature ventricular contractions)     s/p ablation 01-07-2013 by Dr Rayann Heman    Current Outpatient Prescriptions  Medication Sig Dispense Refill  . aspirin EC 81 MG tablet Take 81 mg by mouth daily.      . carvedilol (COREG) 12.5 MG tablet Take 1 tablet (12.5 mg total) by mouth 2 (two) times daily with a meal.  60 tablet  11  . furosemide (LASIX) 40 MG tablet Take 1 tablet (40 mg total) by mouth daily.  30 tablet  6  . glimepiride (AMARYL) 2 MG tablet Take 1 tablet (2 mg total) by mouth daily before breakfast.  30 tablet  6  . hydrALAZINE (APRESOLINE) 50 MG tablet Take 1 tablet (50 mg total) by mouth 3 (three) times daily.  90 tablet  6  . isosorbide mononitrate (IMDUR) 30 MG 24 hr tablet Take 2 tablets (60 mg total) by mouth daily.  60 tablet  6  . lisinopril (PRINIVIL,ZESTRIL) 10 MG tablet Take 1 tablet (10 mg total) by mouth daily.  30 tablet  6   No current facility-administered medications for this encounter.  Filed Vitals:   03/21/13 1122  BP: 158/98  Pulse: 71  Weight: 188 lb (85.276 kg)  SpO2: 98%   PHYSICAL EXAM General:  Well appearing. No resp difficulty HEENT: normal Neck: supple. JVP flat. Carotids 2+ bilaterally; no bruits. No lymphadenopathy or thryomegaly appreciated. Cor: PMI normal. Irregular rate & rhythm . No rubs, gallops or murmurs. Lungs: clear Abdomen: soft, nontender, nondistended. No hepatosplenomegaly. No bruits or masses. Good bowel sounds. Extremities: no cyanosis, clubbing, rash, edema Neuro: alert & orientedx3, cranial nerves grossly intact. Moves all 4 extremities w/o difficulty. Affect pleasant.   ASSESSMENT & PLAN: 1. Chronic Systolic Heart Failure: NICM, cardiac cath 2005 with nonobstructive  coronary artery disease (first diagonal 25% stenosis, ramus intermedius 25% stenosis)  07/30/12 ECHO EF 20-25% repeat ECHO 11/24/12 EF ~30% with frequent PVCs.  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   NYHA II symptoms. Volume status stable.   Continue lasix 40 mg daily.  - Echo reviewed personally. EF appears some better. Will await final read.  - Continue carvedilol 12.5 mg twice a day.  - Continue  lisinopril 10 mg daily. Not on spiro yet.. - Continue hydralazine 50 mg TID and IMDUR to 60 mg daily.  - Reinforced the need and importance of daily weights, a low sodium diet, and fluid restriction (less than 2 L a day). Instructed to call the HF clinic if weight increases more than 3 lbs overnight or 5 lbs in a week.  2. HTN: Elevated. Reviewed BP from previous visits which have been stable. Instructed to get BP cuff at home and take BP once a day. Will f/u with Dr. Asa Lente. Goal SBP < 140.  Continue hydralazine to 50 mg tid/ Imdur 60 mg daily, lisinopril 5 mg daily, and carvedilol 12.5 mg twice a day. 3. Snores: Sleep study completed. Recommendation to try CPAP. He refuses CPAP.    4. CKD, stage III:  Creatinine baseline  1.5-1.7 5. S/P PVC ablation 01/07/13.  24 hour monitor results show decreased PVC burden. Follows with Dr. Rayann Heman.   Follow up in 6 months   CLEGG,AMY NP-C 11:24 AM  Patient seen and examined with Darrick Grinder, NP. We discussed all aspects of the encounter. I agree with the assessment and plan as stated above. I have edited not above to reflect my impressions. ECHO reviewed personally. EF appears some better with suppression of PVC burden. Will get BP cuff at home and follow BP closely.   Mathea Frieling,MD 12:13 PM

## 2013-03-21 NOTE — Patient Instructions (Signed)
We will contact you in 6 months to schedule your next appointment.  

## 2013-03-21 NOTE — Progress Notes (Signed)
  Echocardiogram 2D Echocardiogram has been performed.  Mauricio Po 03/21/2013, 10:38 AM

## 2013-03-21 NOTE — Addendum Note (Signed)
Encounter addended by: Scarlette Calico, RN on: 03/21/2013 12:16 PM<BR>     Documentation filed: Patient Instructions Section

## 2013-04-03 ENCOUNTER — Other Ambulatory Visit: Payer: Self-pay | Admitting: Internal Medicine

## 2013-04-13 ENCOUNTER — Emergency Department (HOSPITAL_COMMUNITY): Payer: BC Managed Care – PPO

## 2013-04-13 ENCOUNTER — Emergency Department (HOSPITAL_COMMUNITY)
Admission: EM | Admit: 2013-04-13 | Discharge: 2013-04-13 | Disposition: A | Discharge status: Home / Self Care | Payer: BC Managed Care – PPO | Attending: Emergency Medicine | Admitting: Emergency Medicine

## 2013-04-13 ENCOUNTER — Encounter (HOSPITAL_COMMUNITY): Payer: Self-pay | Admitting: Emergency Medicine

## 2013-04-13 DIAGNOSIS — Z9889 Other specified postprocedural states: Secondary | ICD-10-CM | POA: Insufficient documentation

## 2013-04-13 DIAGNOSIS — Z9119 Patient's noncompliance with other medical treatment and regimen: Secondary | ICD-10-CM | POA: Insufficient documentation

## 2013-04-13 DIAGNOSIS — I10 Essential (primary) hypertension: Secondary | ICD-10-CM | POA: Insufficient documentation

## 2013-04-13 DIAGNOSIS — E7800 Pure hypercholesterolemia, unspecified: Secondary | ICD-10-CM | POA: Insufficient documentation

## 2013-04-13 DIAGNOSIS — Z91199 Patient's noncompliance with other medical treatment and regimen due to unspecified reason: Secondary | ICD-10-CM | POA: Insufficient documentation

## 2013-04-13 DIAGNOSIS — I509 Heart failure, unspecified: Secondary | ICD-10-CM | POA: Insufficient documentation

## 2013-04-13 DIAGNOSIS — J45909 Unspecified asthma, uncomplicated: Secondary | ICD-10-CM | POA: Insufficient documentation

## 2013-04-13 DIAGNOSIS — Z7982 Long term (current) use of aspirin: Secondary | ICD-10-CM | POA: Insufficient documentation

## 2013-04-13 DIAGNOSIS — E119 Type 2 diabetes mellitus without complications: Secondary | ICD-10-CM | POA: Insufficient documentation

## 2013-04-13 DIAGNOSIS — M545 Low back pain, unspecified: Secondary | ICD-10-CM

## 2013-04-13 DIAGNOSIS — IMO0002 Reserved for concepts with insufficient information to code with codable children: Secondary | ICD-10-CM | POA: Insufficient documentation

## 2013-04-13 DIAGNOSIS — I2510 Atherosclerotic heart disease of native coronary artery without angina pectoris: Secondary | ICD-10-CM | POA: Insufficient documentation

## 2013-04-13 DIAGNOSIS — Z79899 Other long term (current) drug therapy: Secondary | ICD-10-CM | POA: Insufficient documentation

## 2013-04-13 MED ORDER — CYCLOBENZAPRINE HCL 10 MG PO TABS: 10.0000 mg | ORAL_TABLET | Freq: Two times a day (BID) | ORAL | Status: DC | PRN

## 2013-04-13 MED ORDER — ACETAMINOPHEN 325 MG PO TABS
650.0000 mg | ORAL_TABLET | Freq: Once | ORAL | Status: AC
  Administered 2013-04-13: 650 mg via ORAL
  Filled 2013-04-13: qty 2

## 2013-04-13 NOTE — ED Provider Notes (Signed)
CSN: [id number]     Arrival date & time 04/13/13  0909 History   First MD Initiated Contact with Patient 04/13/13 1001     Chief Complaint  Patient presents with  . Back Pain  . Leg Pain     (Consider location/radiation/quality/duration/timing/severity/associated sxs/prior Treatment) The history is provided by the patient.   Patient with history of back pain presents with low back pain with radiation to both legs for the past week. Pain has been gradual in onset worse with movement and walking. The pain is described as sharp. It has not been improved with ibuprofen. Denies fever, chills, night sweats, weight loss, abdominal pain, urinary symptoms, change in bowel habits, bowel or bladder incontinence, saddle anesthesia, weakness or numbness of the legs. Denies any heavy lifting or injury. Denies any history of cancer or IV drug use.    PCP is Dr. Asa Lente  Past Medical History  Diagnosis Date  . Asthma   . CAD     nonobst cath 2004  . CARDIOMYOPATHY     NICM, LVEF 30% 01/2011 echo; 20-25% 07/2012 echo  . DIABETES MELLITUS, TYPE II   . HYPERCHOLESTEROLEMIA   . HYPERTENSION   . PSA, INCREASED   . Noncompliance   . CHF (congestive heart failure)   . PVCs (premature ventricular contractions)     s/p ablation 01-07-2013 by Dr Rayann Heman   Past Surgical History  Procedure Laterality Date  . Cardiac catheterization  2004    nonobst dz  . Surgery scrotal / testicular      at age 58  . Ablation  01-07-2013    RVOT PVC's ablated by Dr Rayann Heman along anteroseptal RVOT   Family History  Problem Relation Age of Onset  . Peripheral vascular disease    . Hypertension    . Prostate cancer    . Hypertension Mother   . Peripheral vascular disease Mother   . Prostate cancer Brother    History  Substance Use Topics  . Smoking status: Never Smoker   . 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 Alcohol Use: No     Comment: rare     Review of Systems  Constitutional: Negative for fever.  Respiratory: Negative for cough and shortness of breath.   Cardiovascular: Negative for chest pain.  Gastrointestinal: Negative for nausea, vomiting, abdominal pain and diarrhea.  Genitourinary: Negative for dysuria, urgency and frequency.  Musculoskeletal: Positive for back pain. Negative for gait problem.  Neurological: Negative for weakness and numbness.  All other systems reviewed and are negative.      Allergies  Ambien  Home Medications   Current Outpatient Rx  Name  Route  Sig  Dispense  Refill  . aspirin EC 81 MG tablet   Oral   Take 81 mg by mouth daily.         . carvedilol (COREG) 12.5 MG tablet   Oral   Take 1 tablet (12.5 mg total) by mouth 2 (two) times daily with a meal.   60 tablet   11   . furosemide (LASIX) 40 MG tablet   Oral   Take 1 tablet (40 mg total) by mouth daily.   30 tablet   6   . glimepiride (AMARYL) 2 MG tablet   Oral   Take 2 mg by mouth daily with breakfast.         . hydrALAZINE (APRESOLINE) 50 MG tablet   Oral  Take 1 tablet (50 mg total) by mouth 3 (three) times daily.   90 tablet   6   . ibuprofen (ADVIL,MOTRIN) 200 MG tablet   Oral   Take 600 mg by mouth every 6 (six) hours as needed.         . isosorbide mononitrate (IMDUR) 30 MG 24 hr tablet   Oral   Take 2 tablets (60 mg total) by mouth daily.   60 tablet   6   . lisinopril (PRINIVIL,ZESTRIL) 10 MG tablet   Oral   Take 1 tablet (10 mg total) by mouth daily.   30 tablet   6    BP 170/98  Pulse 78  Temp(Src) 97.7 F (36.5 C) (Oral)  Resp 18  SpO2 100% Physical Exam  Nursing note and vitals reviewed. Constitutional: He appears well-developed and well-nourished. No distress.  HENT:  Head: Normocephalic and atraumatic.  Eyes: Conjunctivae are normal.  Neck: Normal range of motion. Neck supple.  Pulmonary/Chest: Effort normal.  Abdominal: Soft. He exhibits no distension and no mass.  There is no tenderness. There is no rebound and no guarding.  Musculoskeletal:  Spine nontender, no crepitus, or stepoffs. Lower extremities:  Strength 5/5, sensation intact, distal pulses intact.    Pain reproduced with straight leg raise on right.    Neurological: He is alert. He exhibits normal muscle tone.  Skin: He is not diaphoretic.  Psychiatric: He has a normal mood and affect. His behavior is normal.    ED Course  Procedures (including critical care time) Labs Review Labs Reviewed - No data to display Imaging Review Dg Lumbar Spine Complete  04/13/2013   CLINICAL DATA:  Lower back pain without injury.  EXAM: LUMBAR SPINE - COMPLETE 4+ VIEW  COMPARISON:  None.  FINDINGS: No fracture or spondylolisthesis is noted. Mild anterior osteophyte formation is noted at L2-3, L3-4 and L4-5. Disc spaces appear to be well maintained. Posterior facet joints appear normal. Probable calculus projected over upper pole of right kidney.  IMPRESSION: Mild degenerative changes as described above. No acute abnormality seen in the lumbar spine. Probable right renal calculus.   Electronically Signed   By: Sabino Dick M.D.   On: 04/13/2013 11:16     EKG Interpretation None      MDM   Final diagnoses:  Low back pain potentially associated with radiculopathy    58 year old with gradual onset low back pain with radiation into bilateral legs. He is afebrile, nontoxic. No red flags. X-ray shoes mild arthritic changes. D/C home flexeril.  Right renal calculus appears to be in kidney, doubt ureteral stone.  PCP follow up. Discussed result, findings, treatment, and follow up  with patient.  Pt given return precautions.  Pt verbalizes understanding and agrees with plan.       I doubt any other EMC precluding discharge at this time including, but not necessarily limited to the following: cauda equina, acute cord compression or significant nerve root compression, ruptured disk, AAA, fracture, tumor, or  infection.    Clayton Bibles, PA-C 04/13/13 1246

## 2013-04-13 NOTE — ED Notes (Signed)
Pt c/o low back pain radiating down both legs x 1 wk.  Denies injury.  Denies hx of back pain.  Denies trouble urinating.  Worse upon movement.

## 2013-04-13 NOTE — ED Provider Notes (Signed)
Medical screening examination/treatment/procedure(s) were performed by non-physician practitioner and as supervising physician I was immediately available for consultation/collaboration.   EKG Interpretation None        Sheyla Zaffino T Classie Weng, MD 04/13/13 1540 

## 2013-04-13 NOTE — Discharge Instructions (Signed)
Read the information below.  Use the prescribed medication as directed.  Please discuss all new medications with your pharmacist.  You may return to the Emergency Department at any time for worsening condition or any new symptoms that concern you.    If you develop fevers, loss of control of bowel or bladder, weakness or numbness in your legs, or are unable to walk, return to the ER for a recheck.  ° °Back Pain, Adult °Low back pain is very common. About 1 in 5 people have back pain. The cause of low back pain is rarely dangerous. The pain often gets better over time. About half of people with a sudden onset of back pain feel better in just 2 weeks. About 8 in 10 people feel better by 6 weeks.  °CAUSES °Some common causes of back pain include: °· Strain of the muscles or ligaments supporting the spine. °· Wear and tear (degeneration) of the spinal discs. °· Arthritis. °· Direct injury to the back. °DIAGNOSIS °Most of the time, the direct cause of low back pain is not known. However, back pain can be treated effectively even when the exact cause of the pain is unknown. Answering your caregiver's questions about your overall health and symptoms is one of the most accurate ways to make sure the cause of your pain is not dangerous. If your caregiver needs more information, he or she may order lab work or imaging tests (X-rays or MRIs). However, even if imaging tests show changes in your back, this usually does not require surgery. °HOME CARE INSTRUCTIONS °For many people, back pain returns. Since low back pain is rarely dangerous, it is often a condition that people can learn to manage on their own.  °· Remain active. It is stressful on the back to sit or stand in one place. Do not sit, drive, or stand in one place for more than 30 minutes at a time. Take short walks on level surfaces as soon as pain allows. Try to increase the length of time you walk each day. °· Do not stay in bed. Resting more than 1 or 2 days can  delay your recovery. °· Do not avoid exercise or work. Your body is made to move. It is not dangerous to be active, even though your back may hurt. Your back will likely heal faster if you return to being active before your pain is gone. °· Pay attention to your body when you  bend and lift. Many people have less discomfort when lifting if they bend their knees, keep the load close to their bodies, and avoid twisting. Often, the most comfortable positions are those that put less stress on your recovering back. °· Find a comfortable position to sleep. Use a firm mattress and lie on your side with your knees slightly bent. If you lie on your back, put a pillow under your knees. °· Only take over-the-counter or prescription medicines as directed by your caregiver. Over-the-counter medicines to reduce pain and inflammation are often the most helpful. Your caregiver may prescribe muscle relaxant drugs. These medicines help dull your pain so you can more quickly return to your normal activities and healthy exercise. °· Put ice on the injured area. °· Put ice in a plastic bag. °· Place a towel between your skin and the bag. °· Leave the ice on for 15-20 minutes, 03-04 times a day for the first 2 to 3 days. After that, ice and heat may be alternated to reduce pain and spasms. °· Ask your caregiver about trying back exercises and gentle massage. This may be of some   benefit. °· Avoid feeling anxious or stressed. Stress increases muscle tension and can worsen back pain. It is important to recognize when you are anxious or stressed and learn ways to manage it. Exercise is a great option. °SEEK MEDICAL CARE IF: °· You have pain that is not relieved with rest or medicine. °· You have pain that does not improve in 1 week. °· You have new symptoms. °· You are generally not feeling well. °SEEK IMMEDIATE MEDICAL CARE IF:  °· You have pain that radiates from your back into your legs. °· You develop new bowel or bladder control  problems. °· You have unusual weakness or numbness in your arms or legs. °· You develop nausea or vomiting. °· You develop abdominal pain. °· You feel faint. °Document Released: 01/13/2005 Document Revised: 07/15/2011 Document Reviewed: 06/03/2010 °[employer]® Patient Information ©2014 [employer]. ° °

## 2013-04-13 NOTE — ED Notes (Signed)
Pt presents with c/o lower back pain that radiates down the backs of both of his legs; pt says pain started this past Sunday; no injury that pt can recall

## 2013-04-13 NOTE — ED Notes (Signed)
Patient transported to X-ray 

## 2013-04-18 ENCOUNTER — Other Ambulatory Visit (HOSPITAL_COMMUNITY): Payer: BC Managed Care – PPO

## 2013-04-18 ENCOUNTER — Other Ambulatory Visit (INDEPENDENT_AMBULATORY_CARE_PROVIDER_SITE_OTHER): Payer: BC Managed Care – PPO

## 2013-04-18 ENCOUNTER — Encounter: Payer: Self-pay | Admitting: Internal Medicine

## 2013-04-18 ENCOUNTER — Ambulatory Visit (INDEPENDENT_AMBULATORY_CARE_PROVIDER_SITE_OTHER): Payer: BC Managed Care – PPO | Admitting: Internal Medicine

## 2013-04-18 VITALS — BP 162/80 | HR 75 | Temp 98.4°F | Wt 190.0 lb

## 2013-04-18 DIAGNOSIS — E119 Type 2 diabetes mellitus without complications: Secondary | ICD-10-CM

## 2013-04-18 DIAGNOSIS — M549 Dorsalgia, unspecified: Secondary | ICD-10-CM

## 2013-04-18 DIAGNOSIS — E7800 Pure hypercholesterolemia, unspecified: Secondary | ICD-10-CM

## 2013-04-18 LAB — MICROALBUMIN / CREATININE URINE RATIO
Creatinine,U: 265.1 mg/dL
Microalb Creat Ratio: 0.2 mg/g (ref 0.0–30.0)
Microalb, Ur: 0.4 mg/dL (ref 0.0–1.9)

## 2013-04-18 LAB — BASIC METABOLIC PANEL
BUN: 17 mg/dL (ref 6–23)
CO2: 24 mEq/L (ref 19–32)
Calcium: 9.4 mg/dL (ref 8.4–10.5)
Chloride: 110 mEq/L (ref 96–112)
Creatinine, Ser: 1.3 mg/dL (ref 0.4–1.5)
GFR: 73.99 mL/min (ref >60.00)
Glucose, Bld: 93 mg/dL (ref 70–99)
Potassium: 3.9 mEq/L (ref 3.5–5.1)
Sodium: 141 mEq/L (ref 135–145)

## 2013-04-18 LAB — LIPID PANEL
Cholesterol: 221 mg/dL — ABNORMAL HIGH (ref 0–200)
HDL: 39.2 mg/dL (ref >39.00)
LDL Cholesterol: 154 mg/dL — ABNORMAL HIGH (ref 0–99)
Total CHOL/HDL Ratio: 6
Triglycerides: 140 mg/dL (ref 0.0–149.0)
VLDL: 28 mg/dL (ref 0.0–40.0)

## 2013-04-18 LAB — HEMOGLOBIN A1C: Hgb A1c MFr Bld: 6.1 % (ref 4.6–6.5)

## 2013-04-18 MED ORDER — METHOCARBAMOL 500 MG PO TABS: 500.0000 mg | ORAL_TABLET | Freq: Three times a day (TID) | ORAL | Status: DC | PRN

## 2013-04-18 MED ORDER — PREDNISONE (PAK) 10 MG PO TABS: ORAL_TABLET | ORAL | Status: DC

## 2013-04-18 MED ORDER — TRAMADOL HCL 50 MG PO TABS: 50.0000 mg | ORAL_TABLET | Freq: Three times a day (TID) | ORAL | Status: DC | PRN

## 2013-04-18 NOTE — Progress Notes (Signed)
Pre visit review using our clinic review tool, if applicable. No additional management support is needed unless otherwise documented below in the visit note. 

## 2013-04-18 NOTE — Progress Notes (Signed)
Subjective:    Patient ID: [name], male    DOB: 10-Jun-1955, 58 y.o.   MRN: [id number]  Back Pain This is a new problem. The current episode started in the past 7 days. The problem occurs intermittently. The problem has been gradually improving since onset. The pain is present in the gluteal and lumbar spine. The quality of the pain is described as aching. Radiates to: bilateral calves. The pain is moderate. The pain is worse during the day. The symptoms are aggravated by position. Associated symptoms include numbness (bilateral leg but improved). Pertinent negatives include no abdominal pain, chest pain, fever, headaches, tingling or weakness (bilateral leg). He has tried heat, ice, muscle relaxant and NSAIDs for the symptoms. The treatment provided no relief.   Past Medical History  Diagnosis Date  . Asthma   . CAD     nonobst cath 2004  . CARDIOMYOPATHY     NICM, LVEF 30% 01/2011 echo; 20-25% 07/2012 echo  . DIABETES MELLITUS, TYPE II   . HYPERCHOLESTEROLEMIA   . HYPERTENSION   . PSA, INCREASED   . Noncompliance   . CHF (congestive heart failure)   . PVCs (premature ventricular contractions)     s/p ablation 01-07-2013 by Dr Rayann Heman     Review of Systems  Constitutional: Negative for fever, chills, activity change and appetite change.  Respiratory: Negative for shortness of breath.   Cardiovascular: Negative for chest pain, palpitations and leg swelling.  Gastrointestinal: Negative for abdominal pain.  Musculoskeletal: Positive for back pain.  Neurological: Positive for numbness (bilateral leg but improved). Negative for dizziness, tingling, weakness (bilateral leg) and headaches.       Objective:   Physical Exam  Constitutional: He is oriented to person, place, and time. He appears well-developed and well-nourished. No distress.  Musculoskeletal: Normal range of motion. He exhibits no edema and no tenderness.  Back: full range of motion of thoracic and lumbar spine.  Non tender to palpation. Negative straight leg raise. DTR's are symmetrically intact. Sensation intact in all dermatomes of the lower extremities. Full strength to manual muscle testing. patient is able to heel toe walk without difficulty and ambulates with antalgic gait.  Neurological: He is alert and oriented to person, place, and time.  Skin: Skin is warm and dry. He is not diaphoretic.  Psychiatric: He has a normal mood and affect. His behavior is normal. Judgment and thought content normal.    Wt Readings from Last 3 Encounters:  04/18/13 190 lb (86.183 kg)  03/21/13 188 lb (85.276 kg)  02/17/13 185 lb 12.8 oz (84.278 kg)   BP Readings from Last 3 Encounters:  04/18/13 162/80  04/13/13 170/99  03/21/13 158/78   Lab Results  Component Value Date   WBC 6.5 12/31/2012   HGB 13.0 12/31/2012   HCT 39.0 12/31/2012   PLT 272.0 12/31/2012   GLUCOSE 119* 12/31/2012   CHOL 155 08/09/2012   TRIG 81.0 08/09/2012   HDL 39.40 08/09/2012   LDLDIRECT 148.8 04/25/2009   LDLCALC 99 08/09/2012   ALT 22 07/31/2012   AST 19 07/31/2012   NA 140 12/31/2012   K 3.9 12/31/2012   CL 105 12/31/2012   CREATININE 1.43* 01/26/2013   BUN 21 12/31/2012   CO2 28 12/31/2012   TSH 0.928 07/30/2012   PSA 7.80* 04/25/2009   INR 1.1* 09/22/2012   HGBA1C 6.2* 07/30/2012   MICROALBUR 0.5 08/09/2012       Assessment & Plan:   Back pain -symptoms improved.  ED evaluation last week for same reviewed. No neuro deficits on exam report sedation from Flexeril 10 mg. Change to Robaxin 500 mg as needed. Treat with prednisone course x 6 days taper in place of ibuprofen and use tramadol as needed for unrelieved pain. Pt agrees to call if pain unimproved in next 6 weeks with conservative care, call sooner if worse  Problem List Items Addressed This Visit   DIABETES MELLITUS, TYPE II      Variable medication compliance reviewed - encouraged improved efforts at compliance Lab Results  Component Value Date   HGBA1C 6.2* 07/30/2012  on OSA  only Off metformin, and ARB due to ARF 07/2012 with CKD - recheck now declines statin due to myalgias patient advised on anticipated increase in cbgs due to steroid effect for next several days - pt will call if cbg>200     Relevant Orders      Hemoglobin A1c      Microalbumin / creatinine urine ratio      Basic metabolic panel      Lipid panel    Other Visit Diagnoses   Back pain with radiation    -  Primary    Relevant Medications       predniSONE (STERAPRED UNI-PAK) 10 MG tablet       methocarbamol (ROBAXIN) tablet       traMADol (ULTRAM) tablet 50 mg

## 2013-04-18 NOTE — Assessment & Plan Note (Signed)
Variable medication compliance reviewed - encouraged improved efforts at compliance Lab Results  Component Value Date   HGBA1C 6.2* 07/30/2012  on OSA only Off metformin, and ARB due to ARF 07/2012 with CKD - recheck now declines statin due to myalgias patient advised on anticipated increase in cbgs due to steroid effect for next several days - pt will call if cbg>200

## 2013-04-18 NOTE — Patient Instructions (Addendum)
It was good to see you today.  We have reviewed your prior records including ER visit, labs and tests today  Test(s) ordered today. Your results will be released to [place] (or called to you) after review, usually within 72hours after test completion. If any changes need to be made, you will be notified at that same time.  Medications reviewed and updated Stop Flexeril Use prednisone taper over next 6 days and use Robaxin at bedtime and every 8 hours as needed for muscle spasm Also use tramadol as needed for unrelieved pain in place of previously prescribed ibuprofen  if back pain symptoms unimproved in next 6 weeks with this therapy, call for referral as discussed, please call sooner if worse  Please schedule followup in 6 months, call sooner if problems.  Back Exercises Back exercises help treat and prevent back injuries. The goal is to increase your strength in your belly (abdominal) and back muscles. These exercises can also help with flexibility. Start these exercises when told by your doctor. HOME CARE Back exercises include: Pelvic Tilt.  Lie on your back with your knees bent. Tilt your pelvis until the lower part of your back is against the floor. Hold this position 5 to 10 sec. Repeat this exercise 5 to 10 times. Knee to Chest.  Pull 1 knee up against your chest and hold for 20 to 30 seconds. Repeat this with the other knee. This may be done with the other leg straight or bent, whichever feels better. Then, pull both knees up against your chest. Sit-Ups or Curl-Ups.  Bend your knees 90 degrees. Start with tilting your pelvis, and do a partial, slow sit-up. Only lift your upper half 30 to 45 degrees off the floor. Take at least 2 to 3 seonds for each sit-up. Do not do sit-ups with your knees out straight. If partial sit-ups are difficult, simply do the above but with only tightening your belly (abdominal) muscles and holding it as told. Hip-Lift.  Lie on your back with your  knees flexed 90 degrees. Push down with your feet and shoulders as you raise your hips 2 inches off the floor. Hold for 10 seconds, repeat 5 to 10 times. Back Arches.  Lie on your stomach. Prop yourself up on bent elbows. Slowly press on your hands, causing an arch in your low back. Repeat 3 to 5 times. Shoulder-Lifts.  Lie face down with arms beside your body. Keep hips and belly pressed to floor as you slowly lift your head and shoulders off the floor. Do not overdo your exercises. Be careful in the beginning. Exercises may cause you some mild back discomfort. If the pain lasts for more than 15 minutes, stop the exercises until you see your doctor. Improvement with exercise for back problems is slow.  Document Released: 02/15/2010 Document Revised: 04/07/2011 Document Reviewed: 11/14/2010 [facility] Patient Information 2014 [place], [place]. Diabetes and Standards of Medical Care  Diabetes is complicated. You may find that your diabetes team includes a dietitian, nurse, diabetes educator, eye doctor, and more. To help everyone know what is going on and to help you get the care you deserve, the following schedule of care was developed to help keep you on track. Below are the tests, exams, vaccines, medicines, education, and plans you will need. HbA1c test This test shows how well you have controlled your glucose over the past 2 3 months. It is used to see if your diabetes management plan needs to be adjusted.   It is  performed at least 2 times a year if you are meeting treatment goals.  It is performed 4 times a year if therapy has changed or if you are not meeting treatment goals. Blood pressure test  This test is performed at every routine medical visit. The goal is less than 140/90 mmHg for most people, but 130/80 mmHg in some cases. Ask your health care provider about your goal. Dental exam  Follow up with the dentist regularly. Eye exam  If you are diagnosed with type 1 diabetes as a  child, get an exam upon reaching the age of 29 years or older and have had diabetes for 3 5 years. Yearly eye exams are recommended after that initial eye exam.  If you are diagnosed with type 1 diabetes as an adult, get an exam within 5 years of diagnosis and then yearly.  If you are diagnosed with type 2 diabetes, get an exam as soon as possible after the diagnosis and then yearly. Foot care exam  Visual foot exams are performed at every routine medical visit. The exams check for cuts, injuries, or other problems with the feet.  A comprehensive foot exam should be done yearly. This includes visual inspection as well as assessing foot pulses and testing for loss of sensation.  Check your feet nightly for cuts, injuries, or other problems with your feet. Tell your health care provider if anything is not healing. Kidney function test (urine microalbumin)  This test is performed once a year.  Type 1 diabetes: The first test is performed 5 years after diagnosis.  Type 2 diabetes: The first test is performed at the time of diagnosis.  A serum creatinine and estimated glomerular filtration rate (eGFR) test is done once a year to assess the level of chronic kidney disease (CKD), if present. Lipid profile (cholesterol, HDL, LDL, triglycerides)  Performed every 5 years for most people.  The goal for LDL is less than 100 mg/dL. If you are at high risk, the goal is less than 70 mg/dL.  The goal for HDL is 40 mg/dL 50 mg/dL for men and 50 mg/dL 60 mg/dL for women. An HDL cholesterol of 60 mg/dL or higher gives some protection against heart disease.  The goal for triglycerides is less than 150 mg/dL. Influenza vaccine, pneumococcal vaccine, and hepatitis B vaccine  The influenza vaccine is recommended yearly.  The pneumococcal vaccine is generally given once in a lifetime. However, there are some instances when another vaccination is recommended. Check with your health care provider.  The  hepatitis B vaccine is also recommended for adults with diabetes. Diabetes self-management education  Education is recommended at diagnosis and ongoing as needed. Treatment plan  Your treatment plan is reviewed at every medical visit. Document Released: 11/10/2008 Document Revised: 09/15/2012 Document Reviewed: 06/15/2012 [facility] Patient Information 2014 [place].

## 2013-04-19 MED ORDER — ATORVASTATIN CALCIUM 20 MG PO TABS: 20.0000 mg | ORAL_TABLET | Freq: Every day | ORAL | Status: DC

## 2013-04-19 NOTE — Addendum Note (Signed)
Addended by: Gwendolyn Grant A on: 04/19/2013 03:39 PM   Modules accepted: Orders

## 2013-04-19 NOTE — Assessment & Plan Note (Signed)
Will re-rx atorvastatin since LDL >100 -erx done

## 2013-08-02 ENCOUNTER — Telehealth: Payer: Self-pay

## 2013-08-02 DIAGNOSIS — E119 Type 2 diabetes mellitus without complications: Secondary | ICD-10-CM

## 2013-08-02 NOTE — Telephone Encounter (Signed)
Diabetic bundle-a1c and bmet ordered 

## 2013-08-19 IMAGING — CR DG CHEST 2V
2 series · 2 of 2 positions shown · non-contrast
Comparison: 09/09/2009

CLINICAL DATA: Left chest tightness, cough, shortness of breath,
and wheezing

CHEST - 2 VIEW

[w chest pa]
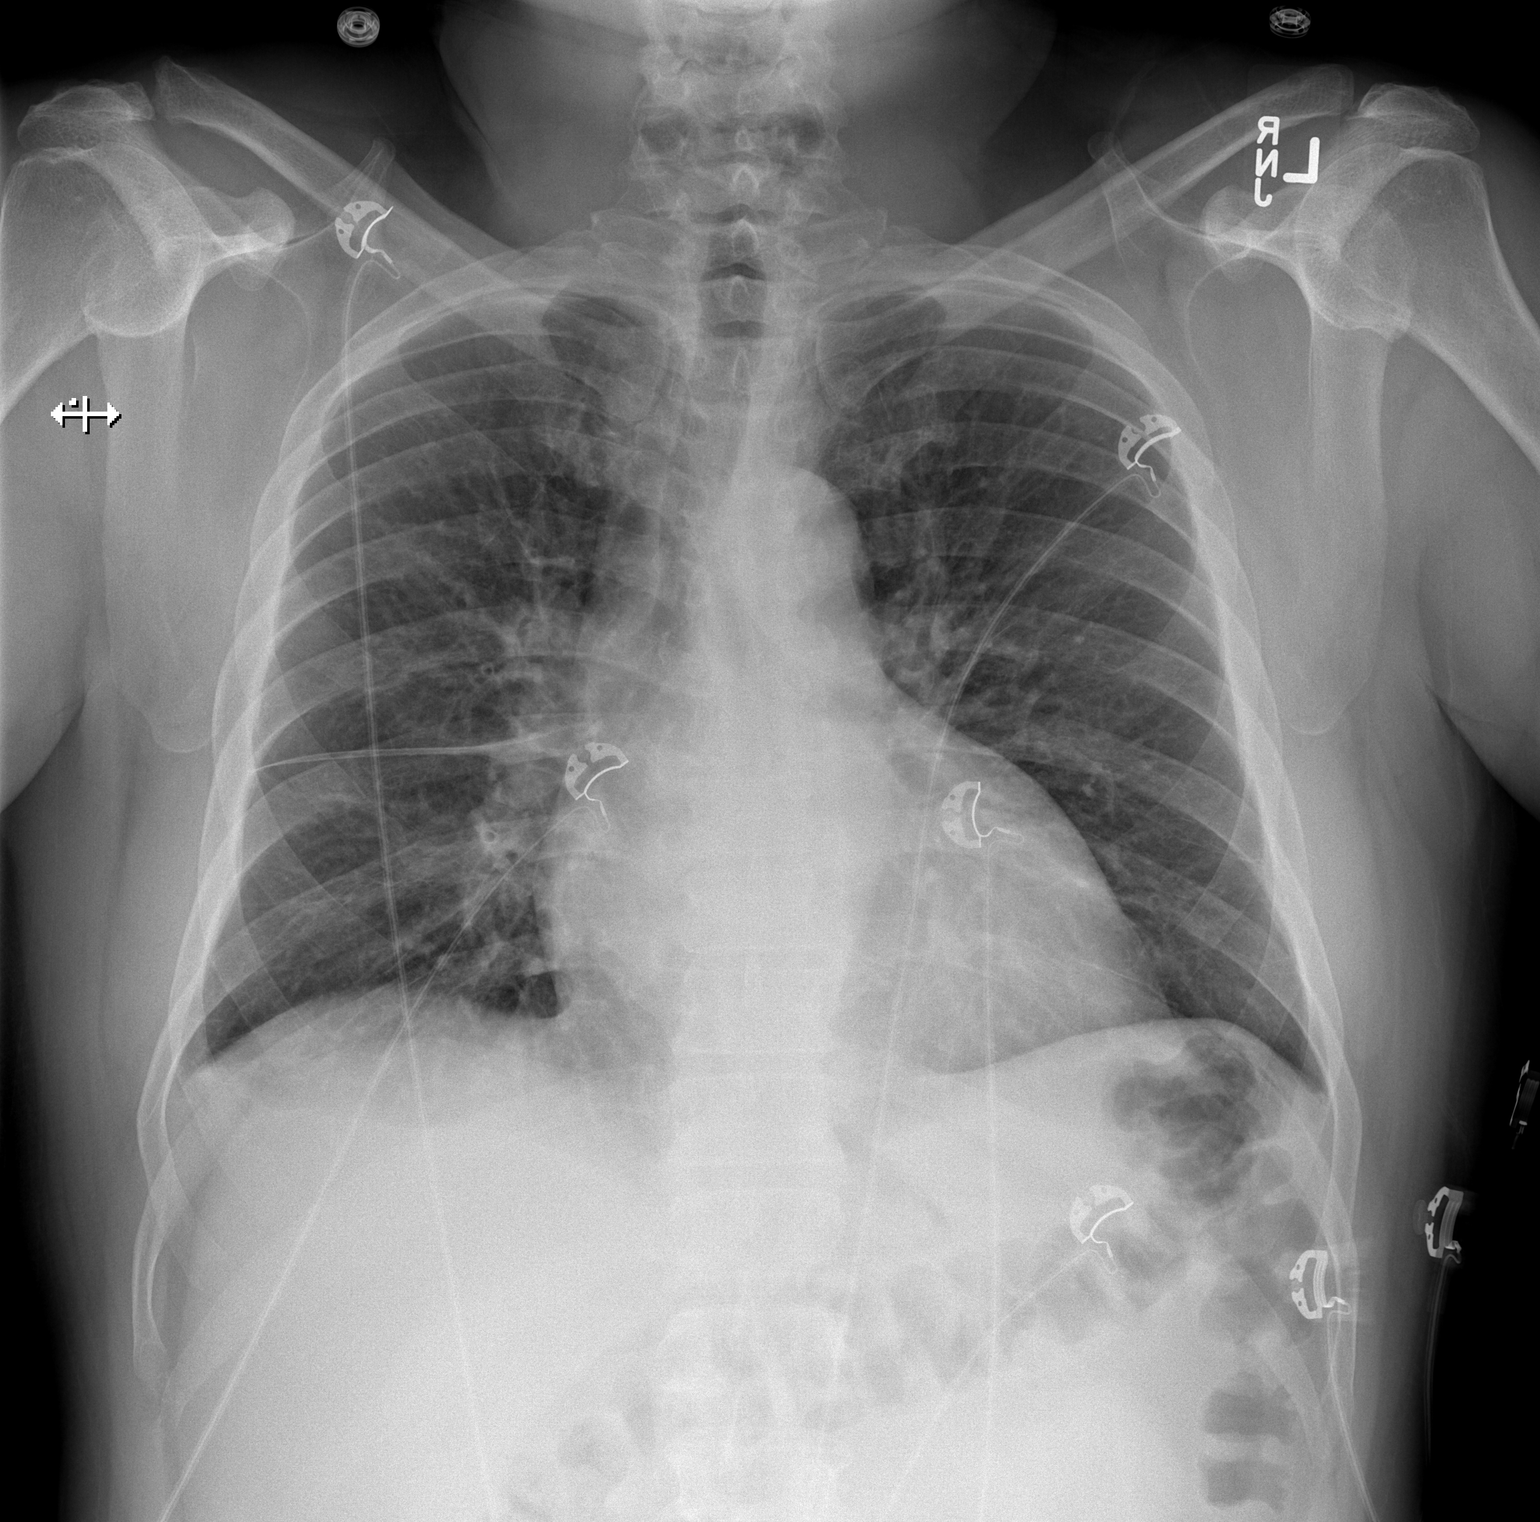

[w chest lat]
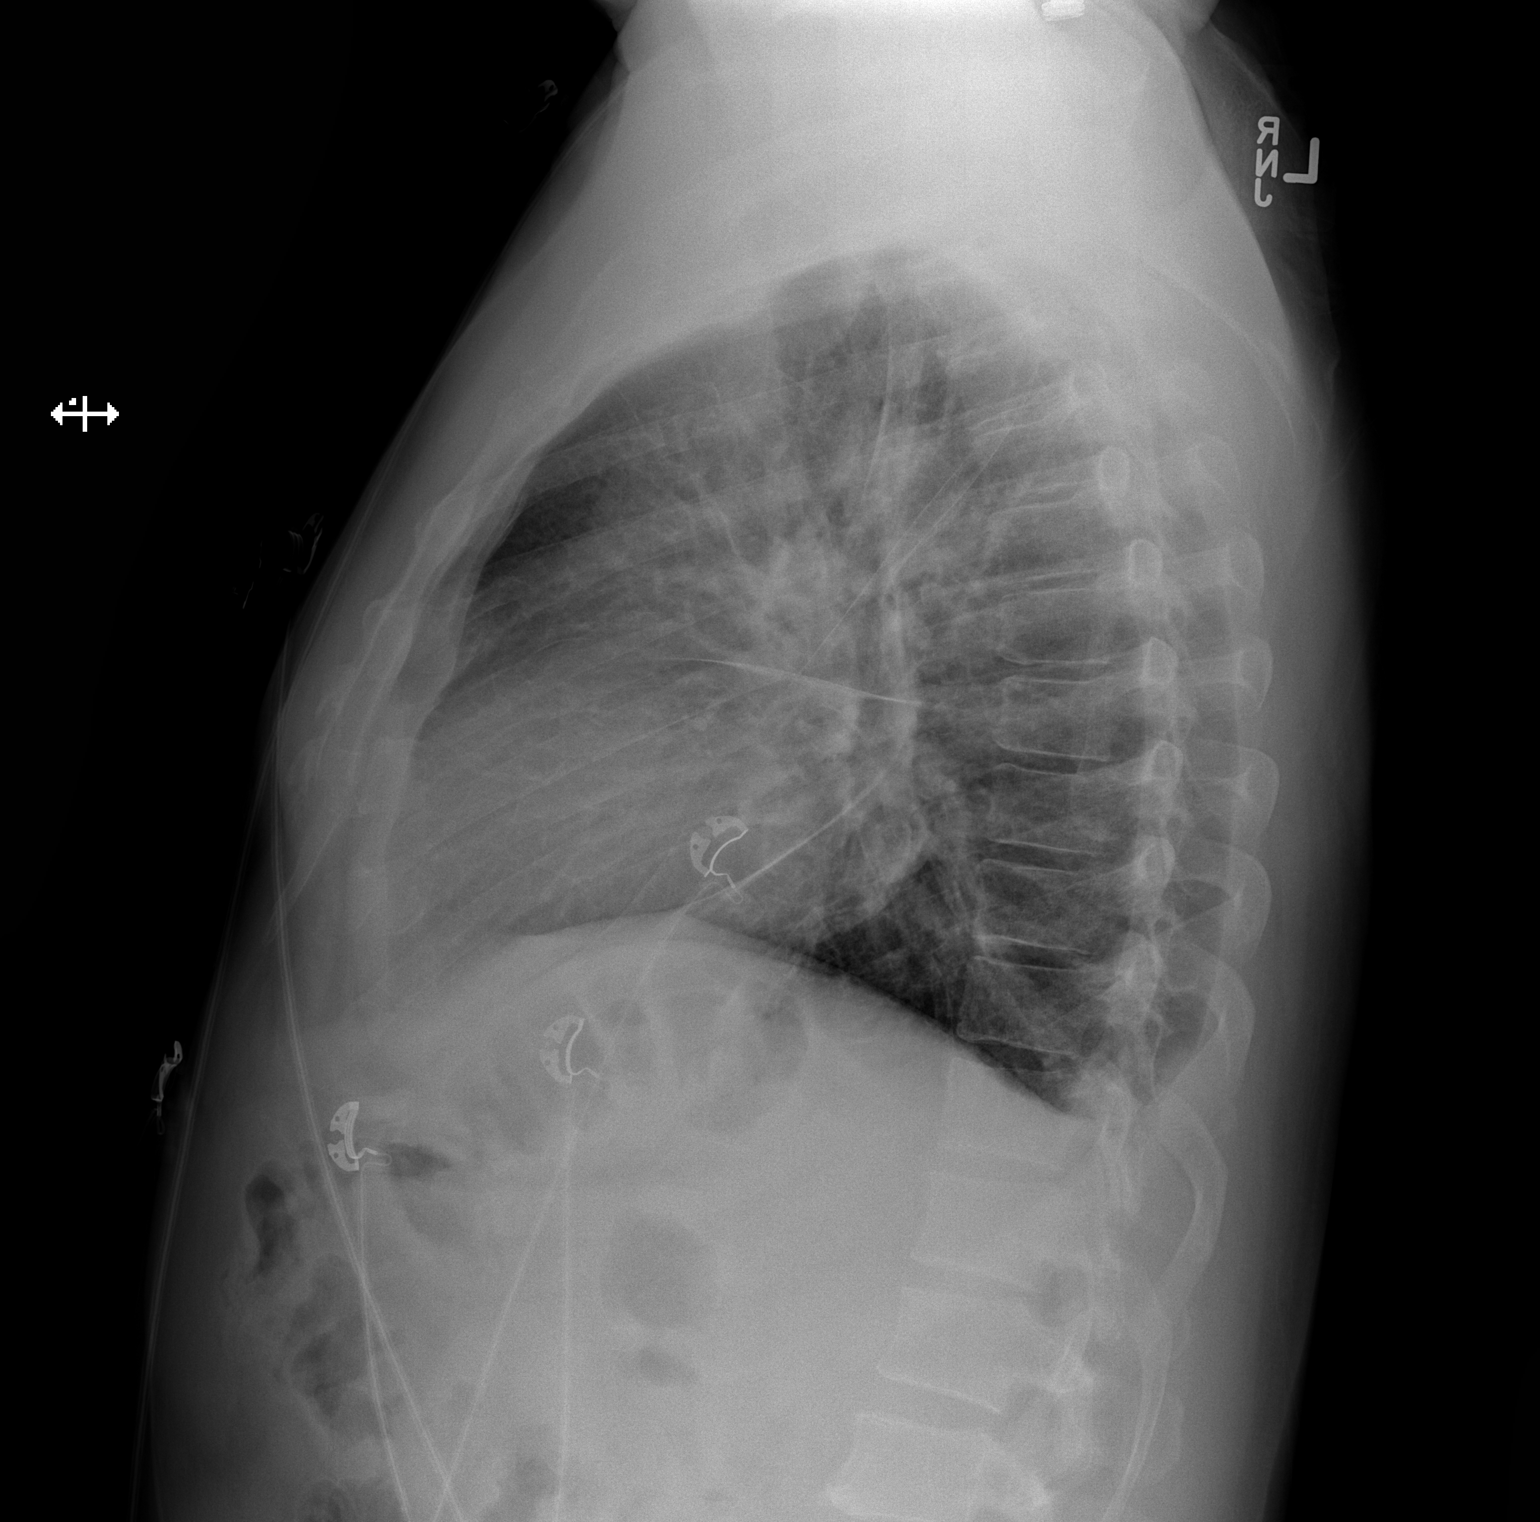

[2 of 2 positions shown; findings below may reference images not displayed]

FINDINGS: Normal heart size and pulmonary vascularity.  Central
peribronchial thickening suggesting changes of chronic bronchitis.
No focal airspace consolidation in the lungs.  No blunting of
costophrenic angles.  No pneumothorax.  No significant change since
previous study.
IMPRESSION: No evidence of active pulmonary disease.

## 2013-08-23 ENCOUNTER — Encounter (HOSPITAL_COMMUNITY): Payer: Self-pay | Admitting: Vascular Surgery

## 2013-09-01 ENCOUNTER — Other Ambulatory Visit (INDEPENDENT_AMBULATORY_CARE_PROVIDER_SITE_OTHER): Payer: BC Managed Care – PPO

## 2013-09-01 ENCOUNTER — Ambulatory Visit (INDEPENDENT_AMBULATORY_CARE_PROVIDER_SITE_OTHER): Payer: BC Managed Care – PPO | Admitting: Internal Medicine

## 2013-09-01 ENCOUNTER — Encounter: Payer: Self-pay | Admitting: Internal Medicine

## 2013-09-01 VITALS — BP 131/72 | HR 71 | Temp 98.2°F | Ht 65.0 in | Wt 192.5 lb

## 2013-09-01 DIAGNOSIS — I5022 Chronic systolic (congestive) heart failure: Secondary | ICD-10-CM

## 2013-09-01 DIAGNOSIS — E119 Type 2 diabetes mellitus without complications: Secondary | ICD-10-CM

## 2013-09-01 DIAGNOSIS — I10 Essential (primary) hypertension: Secondary | ICD-10-CM

## 2013-09-01 DIAGNOSIS — N400 Enlarged prostate without lower urinary tract symptoms: Secondary | ICD-10-CM

## 2013-09-01 LAB — LIPID PANEL
Cholesterol: 104 mg/dL (ref 0–200)
HDL: 34.4 mg/dL — ABNORMAL LOW (ref >39.00)
LDL Cholesterol: 58 mg/dL (ref 0–99)
NonHDL: 69.6
Total CHOL/HDL Ratio: 3
Triglycerides: 57 mg/dL (ref 0.0–149.0)
VLDL: 11.4 mg/dL (ref 0.0–40.0)

## 2013-09-01 LAB — BASIC METABOLIC PANEL
BUN: 15 mg/dL (ref 6–23)
CO2: 26 mEq/L (ref 19–32)
Calcium: 9.7 mg/dL (ref 8.4–10.5)
Chloride: 109 mEq/L (ref 96–112)
Creatinine, Ser: 1.3 mg/dL (ref 0.4–1.5)
GFR: 72.58 mL/min (ref >60.00)
Glucose, Bld: 94 mg/dL (ref 70–99)
Potassium: 4.4 mEq/L (ref 3.5–5.1)
Sodium: 141 mEq/L (ref 135–145)

## 2013-09-01 LAB — HEMOGLOBIN A1C: Hgb A1c MFr Bld: 6.5 % (ref 4.6–6.5)

## 2013-09-01 LAB — PSA: PSA: 14.8 ng/mL — ABNORMAL HIGH (ref 0.10–4.00)

## 2013-09-01 MED ORDER — MELOXICAM 7.5 MG PO TABS: 7.5000 mg | ORAL_TABLET | Freq: Every day | ORAL | Status: DC

## 2013-09-01 NOTE — Assessment & Plan Note (Signed)
Hx noncompliance with meds -improved  BP Readings from Last 3 Encounters:  09/01/13 131/72  04/18/13 162/80  04/13/13 170/99   Reviewed relationship between uncontrolled blood pressure with CHF exacerbation and progressive renal disease

## 2013-09-01 NOTE — Patient Instructions (Addendum)
It was good to see you today.  We have reviewed your prior records including labs and tests today  Test(s) ordered today. Your results will be released to [place] (or called to you) after review, usually within 72hours after test completion. If any changes need to be made, you will be notified at that same time.  Medications reviewed and updated,  Use once daily meloxicam for leg pain instead of Goody - no other changes recommended at this time. Your prescription(s) have been submitted to your pharmacy. Please take as directed and contact our office if you believe you are having problem(s) with the medication(s).  Refill on medication(s) as discussed today.  we'll make referral to urology and to Dr Missy Sabins heart failure clinic. Our office will contact you regarding appointment(s) once made.  Please schedule followup in 6 months, call sooner if problems.  Diabetes and Standards of Medical Care Diabetes is complicated. You may find that your diabetes team includes a dietitian, nurse, diabetes educator, eye doctor, and more. To help everyone know what is going on and to help you get the care you deserve, the following schedule of care was developed to help keep you on track. Below are the tests, exams, vaccines, medicines, education, and plans you will need. HbA1c test This test shows how well you have controlled your glucose over the past 2-3 months. It is used to see if your diabetes management plan needs to be adjusted.   It is performed at least 2 times a year if you are meeting treatment goals.  It is performed 4 times a year if therapy has changed or if you are not meeting treatment goals. Blood pressure test  This test is performed at every routine medical visit. The goal is less than 140/90 mm Hg for most people, but 130/80 mm Hg in some cases. Ask your health care provider about your goal. Dental exam  Follow up with the dentist regularly. Eye exam  If you are diagnosed with  type 1 diabetes as a child, get an exam upon reaching the age of 76 years or older and have had diabetes for 3-5 years. Yearly eye exams are recommended after that initial eye exam.  If you are diagnosed with type 1 diabetes as an adult, get an exam within 5 years of diagnosis and then yearly.  If you are diagnosed with type 2 diabetes, get an exam as soon as possible after the diagnosis and then yearly. Foot care exam  Visual foot exams are performed at every routine medical visit. The exams check for cuts, injuries, or other problems with the feet.  A comprehensive foot exam should be done yearly. This includes visual inspection as well as assessing foot pulses and testing for loss of sensation.  Check your feet nightly for cuts, injuries, or other problems with your feet. Tell your health care provider if anything is not healing. Kidney function test (urine microalbumin)  This test is performed once a year.  Type 1 diabetes: The first test is performed 5 years after diagnosis.  Type 2 diabetes: The first test is performed at the time of diagnosis.  A serum creatinine and estimated glomerular filtration rate (eGFR) test is done once a year to assess the level of chronic kidney disease (CKD), if present. Lipid profile (cholesterol, HDL, LDL, triglycerides)  Performed every 5 years for most people.  The goal for LDL is less than 100 mg/dL. If you are at high risk, the goal is less than 70  mg/dL.  The goal for HDL is 40 mg/dL-50 mg/dL for men and 50 mg/dL-60 mg/dL for women. An HDL cholesterol of 60 mg/dL or higher gives some protection against heart disease.  The goal for triglycerides is less than 150 mg/dL. Influenza vaccine, pneumococcal vaccine, and hepatitis B vaccine  The influenza vaccine is recommended yearly.  It is recommended that people with diabetes who are over 28 years old get the pneumonia vaccine. In some cases, two separate shots may be given. Ask your health care  provider if your pneumonia vaccination is up to date.  The hepatitis B vaccine is also recommended for adults with diabetes. Diabetes self-management education  Education is recommended at diagnosis and ongoing as needed. Treatment plan  Your treatment plan is reviewed at every medical visit. Document Released: 11/10/2008 Document Revised: 05/30/2013 Document Reviewed: 06/15/2012 [facility] Patient Information 2015 [place], [place]. This information is not intended to replace advice given to you by your health care provider. Make sure you discuss any questions you have with your health care provider.

## 2013-09-01 NOTE — Assessment & Plan Note (Signed)
Variable medication compliance reviewed - encouraged improved efforts at compliance Lab Results  Component Value Date   HGBA1C 6.1 04/18/2013  on OSA only - denies hypoglycemia symptoms  Off metformin, and ARB due to ARF 07/2012 with CKD - recheck now Reluctant to take statin due to myalgias

## 2013-09-01 NOTE — Assessment & Plan Note (Signed)
2D echo 07/2012: 20-25%, improved to 40-45% 02/2013 Working with CHF clinic on same - will help arrange follow up  euvolemic on exam today - medications reviewed The current medical regimen is effective;  continue present plan and medications.

## 2013-09-01 NOTE — Progress Notes (Signed)
Subjective:    Patient ID: [name], male    DOB: [birth year], 58 y.o.   MRN: [id number]  HPI  Patient here for follow up Reviewed chronic medical issues and interval medical events  Past Medical History  Diagnosis Date  . Asthma   . CAD     nonobst cath 2004  . CARDIOMYOPATHY     NICM, LVEF 30% 01/2011 echo; 20-25% 07/2012 echo  . DIABETES MELLITUS, TYPE II   . HYPERCHOLESTEROLEMIA   . HYPERTENSION   . PSA, INCREASED   . Noncompliance   . CHF (congestive heart failure)   . PVCs (premature ventricular contractions)     s/p ablation 01-07-2013 by Dr Rayann Heman    Review of Systems  Constitutional: Negative for unexpected weight change.  Respiratory: Negative for cough and shortness of breath.   Cardiovascular: Negative for chest pain and leg swelling.  Musculoskeletal: Positive for gait problem (R ant thigh pain >65mo). Negative for back pain, joint swelling and myalgias.  Neurological: Negative for weakness.       Objective:   Physical Exam  BP 131/72  Pulse 71  Temp(Src) 98.2 F (36.8 C) (Oral)  Ht 5\' 5"  (1.651 m)  Wt 192 lb 8 oz (87.317 kg)  BMI 32.03 kg/m2  SpO2 96% Wt Readings from Last 3 Encounters:  09/01/13 192 lb 8 oz (87.317 kg)  04/18/13 190 lb (86.183 kg)  03/21/13 188 lb (85.276 kg)   Constitutional: he appears well-developed and well-nourished. No distress.  Neck: Normal range of motion. Neck supple. No JVD present. No thyromegaly present.  Cardiovascular: Normal rate, regular rhythm and normal heart sounds.  No murmur heard. No BLE edema. Pulmonary/Chest: Effort normal and breath sounds normal. No respiratory distress. he has no wheezes.  GU: defer at pt MSkel: Back: full range of motion of thoracic and lumbar spine. Non tender to palpation. Negative straight leg raise. DTR's are symmetrically intact. Sensation intact in all dermatomes of the lower extremities. Full strength to manual muscle testing. patient is able to heel toe walk without  difficulty and ambulates with antalgic gait. Psychiatric: he has a normal mood and affect. His behavior is normal. Judgment and thought content normal.   Lab Results  Component Value Date   WBC 6.5 12/31/2012   HGB 13.0 12/31/2012   HCT 39.0 12/31/2012   PLT 272.0 12/31/2012   GLUCOSE 93 04/18/2013   CHOL 221* 04/18/2013   TRIG 140.0 04/18/2013   HDL 39.20 04/18/2013   LDLDIRECT 148.8 04/25/2009   LDLCALC 154* 04/18/2013   ALT 22 07/31/2012   AST 19 07/31/2012   NA 141 04/18/2013   K 3.9 04/18/2013   CL 110 04/18/2013   CREATININE 1.3 04/18/2013   BUN 17 04/18/2013   CO2 24 04/18/2013   TSH 0.928 07/30/2012   PSA 7.80* 04/25/2009   INR 1.1* 09/22/2012   HGBA1C 6.1 04/18/2013   MICROALBUR 0.4 04/18/2013    Dg Lumbar Spine Complete  04/13/2013   CLINICAL DATA:  Lower back pain without injury.  EXAM: LUMBAR SPINE - COMPLETE 4+ VIEW  COMPARISON:  None.  FINDINGS: No fracture or spondylolisthesis is noted. Mild anterior osteophyte formation is noted at L2-3, L3-4 and L4-5. Disc spaces appear to be well maintained. Posterior facet joints appear normal. Probable calculus projected over upper pole of right kidney.  IMPRESSION: Mild degenerative changes as described above. No acute abnormality seen in the lumbar spine. Probable right renal calculus.   Electronically Signed   By: Jeneen Rinks  Green M.D.   On: 04/13/2013 11:16       Assessment & Plan:   RLE proximal pain, constant in anterior thigh Ongoing >6 mo - unimproved with conservative tx of presumed radiculopathy (pred, robaxin and ultram) Reports better with Goody's - Will change to low dose meloxicam as renal function improved  BPH symptoms - refer to uro and check PSA -   Problem List Items Addressed This Visit   Chronic systolic heart failure     2D echo 07/2012: 20-25%, improved to 40-45% 02/2013 Working with CHF clinic on same - will help arrange follow up  euvolemic on exam today - medications reviewed The current medical regimen is effective;   continue present plan and medications.     Relevant Orders      Basic metabolic panel      Ambulatory referral to Cardiology   DIABETES MELLITUS, TYPE II - Primary      Variable medication compliance reviewed - encouraged improved efforts at compliance Lab Results  Component Value Date   HGBA1C 6.1 04/18/2013  on OSA only - denies hypoglycemia symptoms  Off metformin, and ARB due to ARF 07/2012 with CKD - recheck now Reluctant to take statin due to myalgias    Relevant Orders      Hemoglobin A1c      Basic metabolic panel   HTN (hypertension), malignant      Hx noncompliance with meds -improved  BP Readings from Last 3 Encounters:  09/01/13 131/72  04/18/13 162/80  04/13/13 170/99   Reviewed relationship between uncontrolled blood pressure with CHF exacerbation and progressive renal disease     Relevant Orders      Basic metabolic panel      Ambulatory referral to Cardiology    Other Visit Diagnoses   BPH (benign prostatic hypertrophy)        Relevant Orders       PSA       Ambulatory referral to Urology

## 2013-09-01 NOTE — Progress Notes (Signed)
Pre visit review using our clinic review tool, if applicable. No additional management support is needed unless otherwise documented below in the visit note. 

## 2013-10-13 ENCOUNTER — Telehealth (HOSPITAL_COMMUNITY): Payer: Self-pay | Admitting: Vascular Surgery

## 2013-10-13 ENCOUNTER — Other Ambulatory Visit (HOSPITAL_COMMUNITY): Payer: Self-pay | Admitting: Adult Health

## 2013-10-13 ENCOUNTER — Other Ambulatory Visit: Payer: Self-pay | Admitting: Internal Medicine

## 2013-10-17 ENCOUNTER — Encounter (HOSPITAL_COMMUNITY): Payer: Self-pay

## 2013-10-17 ENCOUNTER — Ambulatory Visit (HOSPITAL_COMMUNITY)
Admission: RE | Admit: 2013-10-17 | Discharge: 2013-10-17 | Disposition: A | Discharge status: Home / Self Care | Payer: BC Managed Care – PPO | Source: Ambulatory Visit | Attending: Internal Medicine | Admitting: Internal Medicine

## 2013-10-17 VITALS — BP 160/100 | HR 81 | Wt 195.8 lb

## 2013-10-17 DIAGNOSIS — R0609 Other forms of dyspnea: Secondary | ICD-10-CM | POA: Diagnosis not present

## 2013-10-17 DIAGNOSIS — I2510 Atherosclerotic heart disease of native coronary artery without angina pectoris: Secondary | ICD-10-CM | POA: Insufficient documentation

## 2013-10-17 DIAGNOSIS — I10 Essential (primary) hypertension: Secondary | ICD-10-CM

## 2013-10-17 DIAGNOSIS — I129 Hypertensive chronic kidney disease with stage 1 through stage 4 chronic kidney disease, or unspecified chronic kidney disease: Secondary | ICD-10-CM | POA: Diagnosis not present

## 2013-10-17 DIAGNOSIS — I5022 Chronic systolic (congestive) heart failure: Secondary | ICD-10-CM | POA: Insufficient documentation

## 2013-10-17 DIAGNOSIS — N183 Chronic kidney disease, stage 3 unspecified: Secondary | ICD-10-CM | POA: Insufficient documentation

## 2013-10-17 DIAGNOSIS — I428 Other cardiomyopathies: Secondary | ICD-10-CM | POA: Diagnosis not present

## 2013-10-17 DIAGNOSIS — I4949 Other premature depolarization: Secondary | ICD-10-CM | POA: Diagnosis not present

## 2013-10-17 DIAGNOSIS — E119 Type 2 diabetes mellitus without complications: Secondary | ICD-10-CM | POA: Diagnosis not present

## 2013-10-17 DIAGNOSIS — R0989 Other specified symptoms and signs involving the circulatory and respiratory systems: Secondary | ICD-10-CM | POA: Insufficient documentation

## 2013-10-17 DIAGNOSIS — I509 Heart failure, unspecified: Secondary | ICD-10-CM | POA: Insufficient documentation

## 2013-10-17 MED ORDER — LISINOPRIL 10 MG PO TABS: 20.0000 mg | ORAL_TABLET | Freq: Every day | ORAL | Status: DC

## 2013-10-17 NOTE — Progress Notes (Signed)
Patient ID: [name], male   DOB: April 16, 1955, 58 y.o.   MRN: [id number]   Weight Range  182 pounds  Baseline proBNP    PCP: [name] HPI: [name] is a 58 year old with a history of HTN, PVCs, chronic systolic heart failure, last cardiac cath 2059 Nonobstructive coronary artery disease (first diagonal 25% stenosis, ramus intermedius 25% stenosis) and borderline DM2.   2059 ECHO EF 45% 2013 ECHO EF 25%   59/4/14 ECHO EF 20-25% 10/59 ECHO EF 30% with frequent PVCs, mild LV dilation, mild LVH.  59/23/15 ECHO  EF 40-45% LVIDd 21mm-> 32mm  Admitted to [facility] 59/4/14 through 08/02/12 with mild acute/chronic systolic heart failure in setting on medication noncompliance. CEs negative. CKD noted 1.55. pBNP 320 (previous 1200)  He has been out of his medications 2-3 months. He diuresed 2 pounds and was discharged. Discharge weight 178 pounds. Previous spiro and ace stopped due to CRI. Bidil started.  08/25/57 48 hour Holter Monitor- 29% Monomorphic  PVCs referred to Bradley [name].  He was taken for PVC mapping to be followed by PVC ablation, but at that time, he was not having enough PVCs to map so no ablation was done.   On 12/59/14 he had second PVC ablation by Bradley [name]. He saw Bradley [name] yesterday and he increased his carvedilol to 12.5 mg . Wore 24 hour monitor 59/24/15. Shows decreased PVC burden.    He returns for follow up. Denies SOB/PND/Orthopnea. Says he can walk up steps. [facility] 210/119. Said he was instructed to call clinic however he did not. Weight at home up 195 pounds. Not exercising. Trying to follow low salt diet. He is working full time with mentally challenged adults. Not exercising.     59/14/14 Potassium 3.6 Creatinine 1.7 59/20/14 pro-BNP 604 59/27/14 Potassium 3.5 , Creatinine 1.5 59/2/14 K 3.8 Creatinine 1.3 59/29/14 Pro BNP 478 59/5/14 K 3.9 Creatinine 1.4  59/31/15 1.4   59/06/2013 L 4/4 Creatinine 1.3  ROS: All systems negative except as listed in HPI,  PMH and Problem List.  Past Medical History  Diagnosis Date  . Asthma   . CAD     nonobst cath 2004  . CARDIOMYOPATHY     NICM, LVEF 30% 01/2011 echo; 20-25% 07/2012 echo  . DIABETES MELLITUS, TYPE II   . HYPERCHOLESTEROLEMIA   . HYPERTENSION   . PSA, INCREASED   . Noncompliance   . CHF (congestive heart failure)   . PVCs (premature ventricular contractions)     s/p ablation 01-07-2013 by Bradley [name]    Current Outpatient Prescriptions  Medication Sig Dispense Refill  . aspirin EC 81 MG tablet Take 81 mg by mouth daily.      [name] atorvastatin (LIPITOR) 20 MG tablet Take 1 tablet (20 mg total) by mouth daily.  30 tablet  11  . carvedilol (COREG) 12.5 MG tablet Take 1 tablet (12.5 mg total) by mouth 2 (two) times daily with a meal.  60 tablet  11  . furosemide (LASIX) 40 MG tablet Take 1 tablet (40 mg total) by mouth daily.  30 tablet  6  . glimepiride (AMARYL) 2 MG tablet Take 2 mg by mouth daily with breakfast.      . glimepiride (AMARYL) 2 MG tablet TAKE ONE TABLET BY MOUTH ONCE DAILY BEFORE  BREAKFAST  30 tablet  5  . hydrALAZINE (APRESOLINE) 50 MG tablet TAKE ONE TABLET BY MOUTH THREE TIMES DAILY  90 tablet  0  .  isosorbide mononitrate (IMDUR) 30 MG 24 hr tablet Take 2 tablets (60 mg total) by mouth daily.  60 tablet  6  . lisinopril (PRINIVIL,ZESTRIL) 10 MG tablet TAKE ONE TABLET BY MOUTH ONCE DAILY  30 tablet  0  . meloxicam (MOBIC) 7.5 MG tablet Take 1 tablet (7.5 mg total) by mouth daily.  30 tablet  0   No current facility-administered medications for this encounter.    Filed Vitals:   59/21/15 1404  BP: 160/100  Pulse: 81  Weight: 195 lb 12.8 oz (88.814 kg)  SpO2: 97%   PHYSICAL EXAM General:  Well appearing. No resp difficulty HEENT: normal Neck: supple. JVP 6-7. Carotids 2+ bilaterally; no bruits. No lymphadenopathy or thryomegaly appreciated. Cor: PMI normal. Irregular rate & rhythm . No rubs, gallops or murmurs. Lungs: clear Abdomen: soft, nontender,  nondistended. No hepatosplenomegaly. No bruits or masses. Good bowel sounds. Extremities: no cyanosis, clubbing, rash, edema Neuro: alert & orientedx3, cranial nerves grossly intact. Moves all 4 extremities w/o difficulty. Affect pleasant.   ASSESSMENT & PLAN: 1. Chronic Systolic Heart Failure: NICM, cardiac cath 2005 with nonobstructive coronary artery disease (first diagonal 25% stenosis, ramus intermedius 25% stenosis)  07/30/12 initial EF  20-25% but improved  40-45% in February 2015.  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   NYHA II symptoms. Volume status stable.   Continue lasix 40 mg daily.   - Continue carvedilol 12.5 mg twice a day.  - Increase lisinopril 20 mg daily. Not on spiro yet.. - Continue hydralazine 50 mg TID and IMDUR to 60 mg daily.  - Reinforced the need and importance of daily weights, a low sodium diet, and fluid restriction (less than 2 L a day). Instructed to call the HF clinic if weight increases more than 3 lbs overnight or 5 lbs in a week.  2. HTN: Elevated. Well above baseline.  Goal SBP < 140.  Continue hydralazine to 50 mg tid/ Imdur 60 mg daily, and carvedilol 12.5 mg twice a day. Increase lisinopril  20 daily.  Check BMET in 10 days. Get home BP cuff.  3. Snores: Sleep study completed. Recommendation to try CPAP. He refuses CPAP.    4. CKD, stage III:  Creatinine baseline  1.5-1.7. Reviewed most recent BMET. K 4.4 Creatinine 1.3  5. S/P PVC ablation 01/07/13.  24 hour monitor results show decreased PVC burden. Follows with Bradley. Rayann [name].   Follow up 2 weeks to check BMET and BP.    [name] 2:18 PM  Patient seen and examined with Bradley Grinder, NP. We discussed all aspects of the encounter. I agree with the assessment and plan as stated above.   He is doing well. LVEF improving. Volume status looks good. BP remains quite high. We have asked him to get BP cuff and follow BP at home. Will increase lisinopril to 20 daily. Will need  repeat echo in near future.   Bradley Bensimhon,MD 3:41 PM

## 2013-10-17 NOTE — Patient Instructions (Signed)
Follow up in 2 weeks  Take lisinopril 20 mg daily   Please check BP at home once to twice a day and write it down. Bring to your next visit.   Do the following things EVERYDAY: 1) Weigh yourself in the morning before breakfast. Write it down and keep it in a log. 2) Take your medicines as prescribed 3) Eat low salt foods-Limit salt (sodium) to 2000 mg per day.  4) Stay as active as you can everyday 5) Limit all fluids for the day to less than 2 liters

## 2013-10-28 NOTE — Telephone Encounter (Signed)
OPEN ERROR

## 2013-10-31 ENCOUNTER — Encounter (HOSPITAL_COMMUNITY): Payer: Self-pay

## 2013-10-31 ENCOUNTER — Ambulatory Visit (HOSPITAL_COMMUNITY)
Admission: RE | Admit: 2013-10-31 | Discharge: 2013-10-31 | Disposition: A | Discharge status: Home / Self Care | Payer: BC Managed Care – PPO | Source: Ambulatory Visit | Attending: Internal Medicine | Admitting: Internal Medicine

## 2013-10-31 VITALS — BP 200/120 | HR 99 | Wt 194.2 lb

## 2013-10-31 DIAGNOSIS — G4733 Obstructive sleep apnea (adult) (pediatric): Secondary | ICD-10-CM

## 2013-10-31 DIAGNOSIS — E119 Type 2 diabetes mellitus without complications: Secondary | ICD-10-CM | POA: Diagnosis not present

## 2013-10-31 DIAGNOSIS — I129 Hypertensive chronic kidney disease with stage 1 through stage 4 chronic kidney disease, or unspecified chronic kidney disease: Secondary | ICD-10-CM | POA: Diagnosis not present

## 2013-10-31 DIAGNOSIS — R0683 Snoring: Secondary | ICD-10-CM | POA: Insufficient documentation

## 2013-10-31 DIAGNOSIS — R972 Elevated prostate specific antigen [PSA]: Secondary | ICD-10-CM | POA: Insufficient documentation

## 2013-10-31 DIAGNOSIS — Z7982 Long term (current) use of aspirin: Secondary | ICD-10-CM | POA: Insufficient documentation

## 2013-10-31 DIAGNOSIS — E780 Pure hypercholesterolemia: Secondary | ICD-10-CM | POA: Insufficient documentation

## 2013-10-31 DIAGNOSIS — N182 Chronic kidney disease, stage 2 (mild): Secondary | ICD-10-CM

## 2013-10-31 DIAGNOSIS — Z79899 Other long term (current) drug therapy: Secondary | ICD-10-CM | POA: Insufficient documentation

## 2013-10-31 DIAGNOSIS — N400 Enlarged prostate without lower urinary tract symptoms: Secondary | ICD-10-CM | POA: Insufficient documentation

## 2013-10-31 DIAGNOSIS — I10 Essential (primary) hypertension: Secondary | ICD-10-CM

## 2013-10-31 DIAGNOSIS — I2510 Atherosclerotic heart disease of native coronary artery without angina pectoris: Secondary | ICD-10-CM | POA: Diagnosis not present

## 2013-10-31 DIAGNOSIS — I493 Ventricular premature depolarization: Secondary | ICD-10-CM | POA: Diagnosis not present

## 2013-10-31 DIAGNOSIS — N183 Chronic kidney disease, stage 3 (moderate): Secondary | ICD-10-CM | POA: Diagnosis not present

## 2013-10-31 DIAGNOSIS — Z9889 Other specified postprocedural states: Secondary | ICD-10-CM | POA: Insufficient documentation

## 2013-10-31 DIAGNOSIS — I5022 Chronic systolic (congestive) heart failure: Secondary | ICD-10-CM | POA: Diagnosis present

## 2013-10-31 DIAGNOSIS — Z9119 Patient's noncompliance with other medical treatment and regimen: Secondary | ICD-10-CM | POA: Diagnosis not present

## 2013-10-31 DIAGNOSIS — J45909 Unspecified asthma, uncomplicated: Secondary | ICD-10-CM | POA: Diagnosis not present

## 2013-10-31 DIAGNOSIS — I428 Other cardiomyopathies: Secondary | ICD-10-CM | POA: Insufficient documentation

## 2013-10-31 LAB — BASIC METABOLIC PANEL
Anion gap: 9 (ref 5–15)
BUN: 15 mg/dL (ref 6–23)
CO2: 27 mEq/L (ref 19–32)
Calcium: 9.3 mg/dL (ref 8.4–10.5)
Chloride: 103 mEq/L (ref 96–112)
Creatinine, Ser: 1.44 mg/dL — ABNORMAL HIGH (ref 0.50–1.35)
GFR calc Af Amer: 60 mL/min — ABNORMAL LOW (ref >90)
GFR calc non Af Amer: 52 mL/min — ABNORMAL LOW (ref >90)
Glucose, Bld: 74 mg/dL (ref 70–99)
Potassium: 4.5 mEq/L (ref 3.7–5.3)
Sodium: 139 mEq/L (ref 137–147)

## 2013-10-31 MED ORDER — LISINOPRIL 20 MG PO TABS: 20.0000 mg | ORAL_TABLET | Freq: Two times a day (BID) | ORAL | Status: DC

## 2013-10-31 MED ORDER — LABETALOL HCL 300 MG PO TABS: 300.0000 mg | ORAL_TABLET | Freq: Once | ORAL | Status: DC

## 2013-10-31 NOTE — Patient Instructions (Addendum)
Increase your lisinopril to 20 mg (1 tablet) in the morning and 20 mg (1 tablet) in the evening.  Will call if lab results are abnormal.   Follow up next on October 15th to get blood pressure rechecked and labs.  Follow up in 1 month with ECHO  Start taking your lasix as much as possible.  Before you take your coreg tonight check your pulse and make sure it is greater than 55.   Do the following things EVERYDAY: 1) Weigh yourself in the morning before breakfast. Write it down and keep it in a log. 2) Take your medicines as prescribed 3) Eat low salt foods-Limit salt (sodium) to 2000 mg per day.  4) Stay as active as you can everyday 5) Limit all fluids for the day to less than 2 liters 6)

## 2013-10-31 NOTE — Progress Notes (Signed)
Patient ID: [name], male   DOB: 03/31/1955, 58 y.o.   MRN: [id number]   PCP: Dr Asa Lente HPI: [name] is a 58 year old with a history of HTN, PVCs, chronic systolic heart failure, last cardiac cath 2005 Nonobstructive coronary artery disease (first diagonal 25% stenosis, ramus intermedius 25% stenosis) and borderline DM2.   07/30/12 ECHO EF 20-25% 10/14 ECHO EF 30% with frequent PVCs, mild LV dilation, mild LVH.  03/21/13 ECHO  EF 40-45% LVIDd 50mm-> 62mm  Admitted to [facility] 07/30/12 through 08/02/12 with mild acute/chronic systolic heart failure in setting on medication noncompliance. CEs negative. CKD noted 1.55. pBNP 320 (previous 1200)  He has been out of his medications 2-3 months. He diuresed 2 pounds and was discharged. Discharge weight 178 pounds. Previous spiro and ace stopped due to CRI. Bidil started.  08/25/12 48 hour Holter Monitor- 59% Monomorphic  PVCs referred to Dr Rayann Heman.  He was taken for PVC mapping to be followed by PVC ablation, but at that time, he was not having enough PVCs to map so no ablation was done.   On 01/07/13 he had second PVC ablation by Dr Rayann Heman. He saw Dr Rayann Heman yesterday and he increased his carvedilol to 12.5 mg . Wore 24 hour monitor 02/19/13. Shows decreased PVC burden.    Follow up for Heart Failure: Last visit increased lisinopril 20 mg daily which he has been taking, however only took 10 mg today. 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 SBP at home 123-180s/ 70-100. HR 70-90s. Denies SOB, orthopnea, PND or CP. Weight at home 195-197 lbs. Following low salt diet and drinking less than 2L a day. Went to urologist last week and PSA elevated and prostrate enlarged and has biopsy scheduled in November.   08/09/12 Potassium 3.6 Creatinine 1.7 09/15/12 pro-BNP 604 09/22/12 Potassium 3.5 , Creatinine 1.5 10/28/12 K 3.8 Creatinine 1.3 11/24/12 Pro BNP 478 12/31/12 K 3.9  Creatinine 1.4  01/26/14 1.4   09/01/2013 L 4/4 Creatinine 1.3    ROS: All systems negative except as listed in HPI, PMH and Problem List.  Past Medical History  Diagnosis Date  . Asthma   . CAD     nonobst cath 2004  . CARDIOMYOPATHY     NICM, LVEF 30% 01/2011 echo; 20-25% 07/2012 echo  . DIABETES MELLITUS, TYPE II   . HYPERCHOLESTEROLEMIA   . HYPERTENSION   . PSA, INCREASED   . Noncompliance   . CHF (congestive heart failure)   . PVCs (premature ventricular contractions)     s/p ablation 01-07-2013 by Dr Rayann Heman    Current Outpatient Prescriptions  Medication Sig Dispense Refill  . aspirin EC 81 MG tablet Take 81 mg by mouth daily.      [name] atorvastatin (LIPITOR) 20 MG tablet Take 1 tablet (20 mg total) by mouth daily.  30 tablet  11  . carvedilol (COREG) 12.5 MG tablet Take 1 tablet (12.5 mg total) by mouth 2 (two) times daily with a meal.  60 tablet  11  . glimepiride (AMARYL) 2 MG tablet TAKE ONE TABLET BY MOUTH ONCE DAILY BEFORE  BREAKFAST  30 tablet  5  . hydrALAZINE (APRESOLINE) 50 MG tablet TAKE ONE TABLET BY MOUTH THREE TIMES DAILY  90 tablet  0  . isosorbide mononitrate (IMDUR) 30 MG 24 hr tablet Take 2 tablets (60 mg total) by mouth daily.  60 tablet  6  . lisinopril (PRINIVIL,ZESTRIL) 10 MG tablet Take 2 tablets (20 mg total) by mouth daily.  60 tablet  6  . meloxicam (MOBIC) 7.5 MG tablet Take 1 tablet (7.5 mg total) by mouth daily.  30 tablet  0  . furosemide (LASIX) 40 MG tablet Take 1 tablet (40 mg total) by mouth daily.  30 tablet  6   No current facility-administered medications for this encounter.    Filed Vitals:   10/31/13 1352  BP: 200/120  Pulse: 99  Weight: 194 lb 3.2 oz (88.089 kg)  SpO2: 98%   PHYSICAL EXAM General:  Well appearing. No resp difficulty HEENT: normal Neck: supple. JVP 6-7. Carotids 2+ bilaterally; no bruits. No lymphadenopathy or thryomegaly appreciated. Cor: PMI normal. Irregular rate & rhythm . No rubs, gallops or murmurs. Lungs:  clear Abdomen: soft, nontender, nondistended. No hepatosplenomegaly. No bruits or masses. Good bowel sounds. Extremities: no cyanosis, clubbing, rash, edema Neuro: alert & orientedx3, cranial nerves grossly intact. Moves all 4 extremities w/o difficulty. Affect pleasant.   ASSESSMENT & PLAN:  1. 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 Last ECHO (02/2013): EF 40-45%, grade 1 DD - NYHA II symptoms and volume status stable. Continue lasix 40 mg daily. - Continue coreg 12.5 mg BID and hydralazine 50 mg TID with Imdur 60 mg daily. - SBP 200/100. Gave one time dose of 300 mg of labetalol and instructed to check HR before taking evening coreg. If greater than 55 can take evening dose.  - Increase lisinopril to 20 mg BID. Check BMET today and in 7-10 days.  - Reinforced the need and importance of daily weights, a low sodium diet, and fluid restriction (less than 2 L a day). Instructed to call the HF clinic if weight increases more than 3 lbs overnight or 5 lbs in a week.  2. HTN: Elevated.  Goal SBP < 140. As above will increase lisinopril to 20 gm BID and gave one time dose of 300 mg of labetalol. Continue hydralazine to 50 mg tid/ Imdur 60 mg daily, and carvedilol 12.5 mg twice a day. Follow up 1 week for BP check.  3. Snores: Sleep study completed. Recommendation to try CPAP. He refuses CPAP.    4. CKD, stage III:  Creatinine baseline  1.5-1.7. Check BMET today 5. S/P PVC ablation 01/07/13.  24 hour monitor results show decreased PVC burden. Follows with Dr. Rayann Heman.   Follow up 1 week for BP check and BMET and 1 month with ECHO Junie Bame B NP-C 1:58 PM

## 2013-11-10 ENCOUNTER — Ambulatory Visit (HOSPITAL_COMMUNITY)
Admission: RE | Admit: 2013-11-10 | Discharge: 2013-11-10 | Disposition: A | Discharge status: Home / Self Care | Payer: BC Managed Care – PPO | Source: Ambulatory Visit | Attending: Internal Medicine | Admitting: Internal Medicine

## 2013-11-10 VITALS — BP 182/98

## 2013-11-10 DIAGNOSIS — I5022 Chronic systolic (congestive) heart failure: Secondary | ICD-10-CM

## 2013-11-10 LAB — BASIC METABOLIC PANEL
Anion gap: 9 (ref 5–15)
BUN: 13 mg/dL (ref 6–23)
CO2: 27 mEq/L (ref 19–32)
Calcium: 8.9 mg/dL (ref 8.4–10.5)
Chloride: 108 mEq/L (ref 96–112)
Creatinine, Ser: 1.36 mg/dL — ABNORMAL HIGH (ref 0.50–1.35)
GFR calc Af Amer: 64 mL/min — ABNORMAL LOW (ref >90)
GFR calc non Af Amer: 55 mL/min — ABNORMAL LOW (ref >90)
Glucose, Bld: 116 mg/dL — ABNORMAL HIGH (ref 70–99)
Potassium: 4.1 mEq/L (ref 3.7–5.3)
Sodium: 144 mEq/L (ref 137–147)

## 2013-11-14 ENCOUNTER — Telehealth (HOSPITAL_COMMUNITY): Payer: Self-pay | Admitting: Vascular Surgery

## 2013-11-14 DIAGNOSIS — I5022 Chronic systolic (congestive) heart failure: Secondary | ICD-10-CM

## 2013-11-14 MED ORDER — HYDRALAZINE HCL 50 MG PO TABS: ORAL_TABLET | ORAL | Status: DC

## 2013-11-14 MED ORDER — ISOSORBIDE MONONITRATE ER 30 MG PO TB24: 60.0000 mg | ORAL_TABLET | Freq: Every day | ORAL | Status: DC

## 2013-11-14 NOTE — Telephone Encounter (Signed)
As requested refills sent into pharmacy 

## 2013-11-14 NOTE — Telephone Encounter (Signed)
Refill Hydralazine and Isosorbide

## 2013-11-14 NOTE — Telephone Encounter (Signed)
Refill Hydralazine, Isosorbide

## 2013-11-16 ENCOUNTER — Emergency Department (HOSPITAL_COMMUNITY): Payer: BC Managed Care – PPO

## 2013-11-16 ENCOUNTER — Encounter (HOSPITAL_COMMUNITY)
Admission: EM | Disposition: A | Discharge status: Home / Self Care | Payer: Self-pay | Source: Home / Self Care | Attending: Emergency Medicine

## 2013-11-16 ENCOUNTER — Observation Stay (HOSPITAL_COMMUNITY)
Admission: EM | Admit: 2013-11-16 | Discharge: 2013-11-17 | Disposition: A | Discharge status: Home / Self Care | Payer: BC Managed Care – PPO | Attending: Surgery | Admitting: Surgery

## 2013-11-16 ENCOUNTER — Encounter (HOSPITAL_COMMUNITY): Payer: Self-pay | Admitting: Emergency Medicine

## 2013-11-16 ENCOUNTER — Encounter (HOSPITAL_COMMUNITY): Payer: BC Managed Care – PPO | Admitting: Anesthesiology

## 2013-11-16 ENCOUNTER — Emergency Department (HOSPITAL_COMMUNITY): Payer: BC Managed Care – PPO | Admitting: Anesthesiology

## 2013-11-16 DIAGNOSIS — I429 Cardiomyopathy, unspecified: Secondary | ICD-10-CM | POA: Insufficient documentation

## 2013-11-16 DIAGNOSIS — I2510 Atherosclerotic heart disease of native coronary artery without angina pectoris: Secondary | ICD-10-CM | POA: Insufficient documentation

## 2013-11-16 DIAGNOSIS — K8010 Calculus of gallbladder with chronic cholecystitis without obstruction: Secondary | ICD-10-CM | POA: Diagnosis not present

## 2013-11-16 DIAGNOSIS — I5022 Chronic systolic (congestive) heart failure: Secondary | ICD-10-CM | POA: Insufficient documentation

## 2013-11-16 DIAGNOSIS — Z888 Allergy status to other drugs, medicaments and biological substances status: Secondary | ICD-10-CM | POA: Insufficient documentation

## 2013-11-16 DIAGNOSIS — I493 Ventricular premature depolarization: Secondary | ICD-10-CM | POA: Diagnosis not present

## 2013-11-16 DIAGNOSIS — G4733 Obstructive sleep apnea (adult) (pediatric): Secondary | ICD-10-CM | POA: Diagnosis not present

## 2013-11-16 DIAGNOSIS — J45909 Unspecified asthma, uncomplicated: Secondary | ICD-10-CM | POA: Diagnosis not present

## 2013-11-16 DIAGNOSIS — E780 Pure hypercholesterolemia: Secondary | ICD-10-CM | POA: Diagnosis not present

## 2013-11-16 DIAGNOSIS — I129 Hypertensive chronic kidney disease with stage 1 through stage 4 chronic kidney disease, or unspecified chronic kidney disease: Secondary | ICD-10-CM | POA: Insufficient documentation

## 2013-11-16 DIAGNOSIS — K819 Cholecystitis, unspecified: Secondary | ICD-10-CM

## 2013-11-16 DIAGNOSIS — N182 Chronic kidney disease, stage 2 (mild): Secondary | ICD-10-CM | POA: Diagnosis not present

## 2013-11-16 DIAGNOSIS — E119 Type 2 diabetes mellitus without complications: Secondary | ICD-10-CM | POA: Diagnosis not present

## 2013-11-16 DIAGNOSIS — R1013 Epigastric pain: Secondary | ICD-10-CM | POA: Diagnosis present

## 2013-11-16 HISTORY — PX: CHOLECYSTECTOMY: SHX55

## 2013-11-16 LAB — GLUCOSE, CAPILLARY
Glucose-Capillary: 133 mg/dL — ABNORMAL HIGH (ref 70–99)
Glucose-Capillary: 124 mg/dL — ABNORMAL HIGH (ref 70–99)

## 2013-11-16 LAB — CBC WITH DIFFERENTIAL/PLATELET
Basophils Absolute: 0 10*3/uL (ref 0.0–0.1)
Basophils Relative: 0 % (ref 0–1)
Eosinophils Absolute: 0.1 10*3/uL (ref 0.0–0.7)
Eosinophils Relative: 1 % (ref 0–5)
HCT: 42 % (ref 39.0–52.0)
Hemoglobin: 14.2 g/dL (ref 13.0–17.0)
Lymphocytes Relative: 13 % (ref 12–46)
Lymphs Abs: 1.2 10*3/uL (ref 0.7–4.0)
MCH: 30.5 pg (ref 26.0–34.0)
MCHC: 33.8 g/dL (ref 30.0–36.0)
MCV: 90.3 fL (ref 78.0–100.0)
Monocytes Absolute: 0.8 10*3/uL (ref 0.1–1.0)
Monocytes Relative: 8 % (ref 3–12)
Neutro Abs: 7.4 10*3/uL (ref 1.7–7.7)
Neutrophils Relative %: 78 % — ABNORMAL HIGH (ref 43–77)
Platelets: 256 10*3/uL (ref 150–400)
RBC: 4.65 MIL/uL (ref 4.22–5.81)
RDW: 12.8 % (ref 11.5–15.5)
WBC: 9.5 10*3/uL (ref 4.0–10.5)

## 2013-11-16 LAB — LIPASE, BLOOD: Lipase: 29 U/L (ref 11–59)

## 2013-11-16 LAB — BASIC METABOLIC PANEL
Anion gap: 13 (ref 5–15)
BUN: 14 mg/dL (ref 6–23)
CO2: 21 mEq/L (ref 19–32)
Calcium: 9.3 mg/dL (ref 8.4–10.5)
Chloride: 104 mEq/L (ref 96–112)
Creatinine, Ser: 1.16 mg/dL (ref 0.50–1.35)
GFR calc Af Amer: 78 mL/min — ABNORMAL LOW (ref >90)
GFR calc non Af Amer: 67 mL/min — ABNORMAL LOW (ref >90)
Glucose, Bld: 137 mg/dL — ABNORMAL HIGH (ref 70–99)
Potassium: 4.1 mEq/L (ref 3.7–5.3)
Sodium: 138 mEq/L (ref 137–147)

## 2013-11-16 LAB — HEPATIC FUNCTION PANEL
ALT: 90 U/L — ABNORMAL HIGH (ref 0–53)
AST: 136 U/L — ABNORMAL HIGH (ref 0–37)
Albumin: 3.9 g/dL (ref 3.5–5.2)
Alkaline Phosphatase: 90 U/L (ref 39–117)
Bilirubin, Direct: 1.4 mg/dL — ABNORMAL HIGH (ref 0.0–0.3)
Indirect Bilirubin: 0.6 mg/dL (ref 0.3–0.9)
Total Bilirubin: 2 mg/dL — ABNORMAL HIGH (ref 0.3–1.2)
Total Protein: 7.6 g/dL (ref 6.0–8.3)

## 2013-11-16 LAB — TROPONIN I
Troponin I: <0.3 ng/mL (ref <0.30)
Troponin I: <0.3 ng/mL (ref <0.30)

## 2013-11-16 LAB — AMYLASE: Amylase: 118 U/L — ABNORMAL HIGH (ref 0–105)

## 2013-11-16 SURGERY — LAPAROSCOPIC CHOLECYSTECTOMY WITH INTRAOPERATIVE CHOLANGIOGRAM
Anesthesia: General

## 2013-11-16 MED ORDER — ONDANSETRON HCL 4 MG PO TABS: 4.0000 mg | ORAL_TABLET | Freq: Four times a day (QID) | ORAL | Status: DC | PRN

## 2013-11-16 MED ORDER — ATROPINE SULFATE 0.4 MG/ML IJ SOLN
INTRAMUSCULAR | Status: DC | PRN
  Administered 2013-11-16: 0.4 mg via INTRAVENOUS

## 2013-11-16 MED ORDER — ONDANSETRON HCL 4 MG/2ML IJ SOLN: 4.0000 mg | Freq: Four times a day (QID) | INTRAMUSCULAR | Status: DC | PRN

## 2013-11-16 MED ORDER — LISINOPRIL 20 MG PO TABS
20.0000 mg | ORAL_TABLET | Freq: Two times a day (BID) | ORAL | Status: DC
  Administered 2013-11-16 – 2013-11-17 (×3): 20 mg via ORAL
  Filled 2013-11-16 (×3): qty 1

## 2013-11-16 MED ORDER — EPHEDRINE SULFATE 50 MG/ML IJ SOLN
INTRAMUSCULAR | Status: AC
  Filled 2013-11-16: qty 1

## 2013-11-16 MED ORDER — SUCCINYLCHOLINE CHLORIDE 20 MG/ML IJ SOLN
INTRAMUSCULAR | Status: DC | PRN
  Administered 2013-11-16: 100 mg via INTRAVENOUS

## 2013-11-16 MED ORDER — GLYCOPYRROLATE 0.2 MG/ML IJ SOLN
INTRAMUSCULAR | Status: DC | PRN
  Administered 2013-11-16: 0.6 mg via INTRAVENOUS

## 2013-11-16 MED ORDER — HYDRALAZINE HCL 50 MG PO TABS
50.0000 mg | ORAL_TABLET | Freq: Three times a day (TID) | ORAL | Status: DC
  Administered 2013-11-16 – 2013-11-17 (×3): 50 mg via ORAL
  Filled 2013-11-16 (×4): qty 1

## 2013-11-16 MED ORDER — ATORVASTATIN CALCIUM 20 MG PO TABS
20.0000 mg | ORAL_TABLET | Freq: Every day | ORAL | Status: DC
  Administered 2013-11-16 – 2013-11-17 (×2): 20 mg via ORAL
  Filled 2013-11-16 (×2): qty 1

## 2013-11-16 MED ORDER — HYDRALAZINE HCL 20 MG/ML IJ SOLN
INTRAMUSCULAR | Status: AC
  Filled 2013-11-16: qty 1

## 2013-11-16 MED ORDER — ONDANSETRON HCL 4 MG/2ML IJ SOLN
INTRAMUSCULAR | Status: AC
  Filled 2013-11-16: qty 2

## 2013-11-16 MED ORDER — NEOSTIGMINE METHYLSULFATE 10 MG/10ML IV SOLN
INTRAVENOUS | Status: AC
  Filled 2013-11-16: qty 1

## 2013-11-16 MED ORDER — GLYCOPYRROLATE 0.2 MG/ML IJ SOLN
INTRAMUSCULAR | Status: AC
  Filled 2013-11-16: qty 3

## 2013-11-16 MED ORDER — OXYCODONE-ACETAMINOPHEN 5-325 MG PO TABS
1.0000 | ORAL_TABLET | ORAL | Status: DC | PRN
Start: 2013-11-16 — End: 2013-11-17

## 2013-11-16 MED ORDER — FUROSEMIDE 40 MG PO TABS
40.0000 mg | ORAL_TABLET | Freq: Every day | ORAL | Status: DC
  Administered 2013-11-16 – 2013-11-17 (×2): 40 mg via ORAL
  Filled 2013-11-16 (×2): qty 1

## 2013-11-16 MED ORDER — NEOSTIGMINE METHYLSULFATE 10 MG/10ML IV SOLN
INTRAVENOUS | Status: DC | PRN
  Administered 2013-11-16: 4 mg via INTRAVENOUS

## 2013-11-16 MED ORDER — BUPIVACAINE-EPINEPHRINE (PF) 0.25% -1:200000 IJ SOLN
INTRAMUSCULAR | Status: AC
  Filled 2013-11-16: qty 30

## 2013-11-16 MED ORDER — 0.9 % SODIUM CHLORIDE (POUR BTL) OPTIME
TOPICAL | Status: DC | PRN
  Administered 2013-11-16: 1000 mL

## 2013-11-16 MED ORDER — EPHEDRINE SULFATE 50 MG/ML IJ SOLN
INTRAMUSCULAR | Status: DC | PRN
  Administered 2013-11-16 (×2): 5 mg via INTRAVENOUS

## 2013-11-16 MED ORDER — DEXAMETHASONE SODIUM PHOSPHATE 10 MG/ML IJ SOLN
INTRAMUSCULAR | Status: AC
  Filled 2013-11-16: qty 1

## 2013-11-16 MED ORDER — POTASSIUM CHLORIDE IN NACL 20-0.9 MEQ/L-% IV SOLN
INTRAVENOUS | Status: DC
  Administered 2013-11-16 – 2013-11-17 (×2): via INTRAVENOUS
  Filled 2013-11-16 (×3): qty 1000

## 2013-11-16 MED ORDER — CARVEDILOL 12.5 MG PO TABS
12.5000 mg | ORAL_TABLET | Freq: Two times a day (BID) | ORAL | Status: DC
  Administered 2013-11-16 – 2013-11-17 (×3): 12.5 mg via ORAL
  Filled 2013-11-16 (×6): qty 1

## 2013-11-16 MED ORDER — SODIUM CHLORIDE 0.9 % IJ SOLN
INTRAMUSCULAR | Status: AC
  Filled 2013-11-16: qty 10

## 2013-11-16 MED ORDER — LACTATED RINGERS IV SOLN
INTRAVENOUS | Status: DC
  Administered 2013-11-16: 1000 mL via INTRAVENOUS
  Administered 2013-11-16: 12:00:00 via INTRAVENOUS

## 2013-11-16 MED ORDER — INSULIN ASPART 100 UNIT/ML ~~LOC~~ SOLN
0.0000 [IU] | Freq: Three times a day (TID) | SUBCUTANEOUS | Status: DC
  Administered 2013-11-16: 2 [IU] via SUBCUTANEOUS

## 2013-11-16 MED ORDER — CISATRACURIUM BESYLATE (PF) 10 MG/5ML IV SOLN
INTRAVENOUS | Status: DC | PRN
  Administered 2013-11-16: 6 mg via INTRAVENOUS

## 2013-11-16 MED ORDER — DEXAMETHASONE SODIUM PHOSPHATE 10 MG/ML IJ SOLN
INTRAMUSCULAR | Status: DC | PRN
  Administered 2013-11-16: 10 mg via INTRAVENOUS

## 2013-11-16 MED ORDER — IOHEXOL 300 MG/ML  SOLN
INTRAMUSCULAR | Status: DC | PRN
  Administered 2013-11-16: 50 mL via INTRAVENOUS

## 2013-11-16 MED ORDER — MORPHINE SULFATE 2 MG/ML IJ SOLN
2.0000 mg | INTRAMUSCULAR | Status: DC | PRN
  Administered 2013-11-16: 2 mg via INTRAVENOUS
  Filled 2013-11-16: qty 1

## 2013-11-16 MED ORDER — LACTATED RINGERS IV SOLN
INTRAVENOUS | Status: DC | PRN
  Administered 2013-11-16: 1000 mL via INTRAVENOUS

## 2013-11-16 MED ORDER — HYDRALAZINE HCL 20 MG/ML IJ SOLN
INTRAMUSCULAR | Status: DC | PRN
  Administered 2013-11-16 (×2): 5 mg via INTRAVENOUS

## 2013-11-16 MED ORDER — ISOSORBIDE MONONITRATE ER 60 MG PO TB24
60.0000 mg | ORAL_TABLET | Freq: Every day | ORAL | Status: DC
  Administered 2013-11-16 – 2013-11-17 (×2): 60 mg via ORAL
  Filled 2013-11-16 (×2): qty 1

## 2013-11-16 MED ORDER — ONDANSETRON HCL 4 MG/2ML IJ SOLN
INTRAMUSCULAR | Status: DC | PRN
  Administered 2013-11-16: 4 mg via INTRAVENOUS

## 2013-11-16 MED ORDER — FENTANYL CITRATE 0.05 MG/ML IJ SOLN
INTRAMUSCULAR | Status: DC | PRN
Start: 2013-11-16 — End: 2013-11-16
  Administered 2013-11-16 (×3): 50 ug via INTRAVENOUS
  Administered 2013-11-16: 25 ug via INTRAVENOUS
  Administered 2013-11-16: 50 ug via INTRAVENOUS
  Administered 2013-11-16: 25 ug via INTRAVENOUS

## 2013-11-16 MED ORDER — CISATRACURIUM BESYLATE 20 MG/10ML IV SOLN
INTRAVENOUS | Status: AC
  Filled 2013-11-16: qty 10

## 2013-11-16 MED ORDER — INFLUENZA VAC SPLIT QUAD 0.5 ML IM SUSY
0.5000 mL | PREFILLED_SYRINGE | INTRAMUSCULAR | Status: AC
Start: 2013-11-17 — End: 2013-11-17
  Administered 2013-11-17: 0.5 mL via INTRAMUSCULAR
  Filled 2013-11-16 (×2): qty 0.5

## 2013-11-16 MED ORDER — ATROPINE SULFATE 0.4 MG/ML IJ SOLN
INTRAMUSCULAR | Status: AC
  Filled 2013-11-16: qty 1

## 2013-11-16 MED ORDER — FENTANYL CITRATE 0.05 MG/ML IJ SOLN: 25.0000 ug | INTRAMUSCULAR | Status: DC | PRN

## 2013-11-16 MED ORDER — PROPOFOL 10 MG/ML IV BOLUS
INTRAVENOUS | Status: AC
  Filled 2013-11-16: qty 20

## 2013-11-16 MED ORDER — DEXTROSE 5 % IV SOLN
2.0000 g | Freq: Once | INTRAVENOUS | Status: AC
  Administered 2013-11-16: 2 g via INTRAVENOUS
  Filled 2013-11-16: qty 2

## 2013-11-16 MED ORDER — HEPARIN SODIUM (PORCINE) 5000 UNIT/ML IJ SOLN
5000.0000 [IU] | Freq: Three times a day (TID) | INTRAMUSCULAR | Status: DC
  Administered 2013-11-16 – 2013-11-17 (×2): 5000 [IU] via SUBCUTANEOUS
  Filled 2013-11-16 (×3): qty 1

## 2013-11-16 MED ORDER — MIDAZOLAM HCL 5 MG/5ML IJ SOLN
INTRAMUSCULAR | Status: DC | PRN
  Administered 2013-11-16: 2 mg via INTRAVENOUS

## 2013-11-16 MED ORDER — MIDAZOLAM HCL 2 MG/2ML IJ SOLN
INTRAMUSCULAR | Status: AC
  Filled 2013-11-16: qty 2

## 2013-11-16 MED ORDER — FENTANYL CITRATE 0.05 MG/ML IJ SOLN
INTRAMUSCULAR | Status: AC
  Filled 2013-11-16: qty 5

## 2013-11-16 MED ORDER — PROPOFOL 10 MG/ML IV BOLUS
INTRAVENOUS | Status: DC | PRN
  Administered 2013-11-16: 200 mg via INTRAVENOUS

## 2013-11-16 MED ORDER — ONDANSETRON HCL 4 MG/2ML IJ SOLN: 4.0000 mg | Freq: Once | INTRAMUSCULAR | Status: DC | PRN

## 2013-11-16 MED ORDER — BUPIVACAINE-EPINEPHRINE 0.25% -1:200000 IJ SOLN
INTRAMUSCULAR | Status: DC | PRN
  Administered 2013-11-16: 7 mL

## 2013-11-16 MED ORDER — PNEUMOCOCCAL VAC POLYVALENT 25 MCG/0.5ML IJ INJ
0.5000 mL | INJECTION | INTRAMUSCULAR | Status: AC
  Administered 2013-11-17: 0.5 mL via INTRAMUSCULAR
  Filled 2013-11-16 (×2): qty 0.5

## 2013-11-16 SURGICAL SUPPLY — 40 items
ADH SKN CLS APL DERMABOND .7 (GAUZE/BANDAGES/DRESSINGS) ×1
APPLIER CLIP ROT 10 11.4 M/L (STAPLE) ×2
APR CLP MED LRG 11.4X10 (STAPLE) ×1
BAG SPEC RTRVL LRG 6X4 10 (ENDOMECHANICALS) ×1
CANISTER SUCTION 2500CC (MISCELLANEOUS) ×1 IMPLANT
CATH REDDICK CHOLANGI 4FR 50CM (CATHETERS) IMPLANT
CHLORAPREP W/TINT 26ML (MISCELLANEOUS) ×2 IMPLANT
CLIP APPLIE ROT 10 11.4 M/L (STAPLE) ×1 IMPLANT
COVER MAYO STAND STRL (DRAPES) ×2 IMPLANT
DECANTER SPIKE VIAL GLASS SM (MISCELLANEOUS) ×1 IMPLANT
DERMABOND ADVANCED (GAUZE/BANDAGES/DRESSINGS) ×1
DERMABOND ADVANCED .7 DNX12 (GAUZE/BANDAGES/DRESSINGS) ×1 IMPLANT
DRAPE C-ARM 42X120 X-RAY (DRAPES) ×2 IMPLANT
DRAPE LAPAROSCOPIC ABDOMINAL (DRAPES) ×2 IMPLANT
DRAPE UTILITY XL STRL (DRAPES) ×2 IMPLANT
ELECT REM PT RETURN 9FT ADLT (ELECTROSURGICAL) ×2
ELECTRODE REM PT RTRN 9FT ADLT (ELECTROSURGICAL) ×1 IMPLANT
GLOVE BIOGEL PI IND STRL 7.5 (GLOVE) ×1 IMPLANT
GLOVE BIOGEL PI INDICATOR 7.5 (GLOVE) ×1
GLOVE SS BIOGEL STRL SZ 7.5 (GLOVE) ×1 IMPLANT
GLOVE SUPERSENSE BIOGEL SZ 7.5 (GLOVE) ×1
GOWN STRL REUS W/TWL XL LVL3 (GOWN DISPOSABLE) ×6 IMPLANT
HEMOSTAT SNOW SURGICEL 2X4 (HEMOSTASIS) ×1 IMPLANT
KIT BASIN OR (CUSTOM PROCEDURE TRAY) ×2 IMPLANT
MANIFOLD NEPTUNE II (INSTRUMENTS) ×1 IMPLANT
NS IRRIG 1000ML POUR BTL (IV SOLUTION) IMPLANT
POUCH SPECIMEN RETRIEVAL 10MM (ENDOMECHANICALS) ×1 IMPLANT
SCISSORS LAP 5X35 DISP (ENDOMECHANICALS) ×2 IMPLANT
SET CHOLANGIOGRAPH MIX (MISCELLANEOUS) ×2 IMPLANT
SET IRRIG TUBING LAPAROSCOPIC (IRRIGATION / IRRIGATOR) ×2 IMPLANT
SLEEVE XCEL OPT CAN 5 100 (ENDOMECHANICALS) ×2 IMPLANT
SOLUTION ANTI FOG 6CC (MISCELLANEOUS) ×1 IMPLANT
SUT MNCRL AB 4-0 PS2 18 (SUTURE) ×2 IMPLANT
TOWEL OR 17X26 10 PK STRL BLUE (TOWEL DISPOSABLE) ×2 IMPLANT
TOWEL OR NON WOVEN STRL DISP B (DISPOSABLE) ×2 IMPLANT
TRAY LAPAROSCOPIC (CUSTOM PROCEDURE TRAY) ×2 IMPLANT
TROCAR BLADELESS OPT 5 100 (ENDOMECHANICALS) ×2 IMPLANT
TROCAR XCEL BLUNT TIP 100MML (ENDOMECHANICALS) ×2 IMPLANT
TROCAR XCEL NON-BLD 11X100MML (ENDOMECHANICALS) ×2 IMPLANT
TUBING INSUFFLATION 10FT LAP (TUBING) ×1 IMPLANT

## 2013-11-16 NOTE — H&P (Signed)
[name] is an 58 y.o. male.    Chief Complaint: Abdominal pain  HPI: Patient is a very pleasant 58 year old male who last night developed the sudden onset of severe abdominal pain. He described a pressure or crampy-like pain in his epigastrium that radiated down generally into his mid abdomen. It was constant and severe and he presented to the emergency department. He had nausea but no vomiting. No fever or chills. Has been having normal bowel movements. He says for about 6 months he has been having some intermittent similar pain but not as severe and would go away on its own.  Past Medical History  Diagnosis Date  . Asthma   . CAD     nonobst cath 2004  . CARDIOMYOPATHY     NICM, LVEF 30% 01/2011 echo; 20-25% 07/2012 echo  . DIABETES MELLITUS, TYPE II   . HYPERCHOLESTEROLEMIA   . HYPERTENSION   . PSA, INCREASED   . Noncompliance   . CHF (congestive heart failure)   . PVCs (premature ventricular contractions)     s/p ablation 01-07-2013 by Dr Rayann Heman    Past Surgical History  Procedure Laterality Date  . Cardiac catheterization  2004    nonobst dz  . Surgery scrotal / testicular      at age 30  . Ablation  01-07-2013    RVOT PVC's ablated by Dr Rayann Heman along anteroseptal RVOT    Family History  Problem Relation Age of Onset  . Peripheral vascular disease    . Hypertension    . Prostate cancer    . Hypertension Mother   . Peripheral vascular disease Mother   . Prostate cancer Brother    Social History:  reports that he has never smoked. He has never used smokeless tobacco. He reports that he does not drink alcohol or use illicit drugs.  Allergies:  Allergies  Allergen Reactions  . Ambien [Zolpidem Tartrate] Other (See Comments)    "Went berserk."     (Not in a hospital admission)  Results for orders placed during the hospital encounter of 11/16/13 (from the past 48 hour(s))  LIPASE, BLOOD     Status: None   Collection Time    11/16/13  2:47 AM   Result Value Ref Range   Lipase 29  11 - 59 U/L  AMYLASE     Status: Abnormal   Collection Time    11/16/13  2:47 AM      Result Value Ref Range   Amylase 118 (*) 0 - 105 U/L  BASIC METABOLIC PANEL     Status: Abnormal   Collection Time    11/16/13  2:47 AM      Result Value Ref Range   Sodium 138  137 - 147 mEq/L   Potassium 4.1  3.7 - 5.3 mEq/L   Chloride 104  96 - 112 mEq/L   CO2 21  19 - 32 mEq/L   Glucose, Bld 137 (*) 70 - 99 mg/dL   BUN 14  6 - 23 mg/dL   Creatinine, Ser 1.16  0.50 - 1.35 mg/dL   Calcium 9.3  8.4 - 10.5 mg/dL   GFR calc non Af Amer 67 (*) >90 mL/min   GFR calc Af Amer 78 (*) >90 mL/min   Comment: (NOTE)     The eGFR has been calculated using the CKD EPI equation.     This calculation has not been validated in all clinical situations.     eGFR's persistently <90 mL/min signify  possible Chronic Kidney     Disease.   Anion gap 13  5 - 15  CBC WITH DIFFERENTIAL     Status: Abnormal   Collection Time    11/16/13  2:47 AM      Result Value Ref Range   WBC 9.5  4.0 - 10.5 K/uL   RBC 4.65  4.22 - 5.81 MIL/uL   Hemoglobin 14.2  13.0 - 17.0 g/dL   HCT 42.0  39.0 - 52.0 %   MCV 90.3  78.0 - 100.0 fL   MCH 30.5  26.0 - 34.0 pg   MCHC 33.8  30.0 - 36.0 g/dL   RDW 12.8  11.5 - 15.5 %   Platelets 256  150 - 400 K/uL   Neutrophils Relative % 78 (*) 43 - 77 %   Neutro Abs 7.4  1.7 - 7.7 K/uL   Lymphocytes Relative 13  12 - 46 %   Lymphs Abs 1.2  0.7 - 4.0 K/uL   Monocytes Relative 8  3 - 12 %   Monocytes Absolute 0.8  0.1 - 1.0 K/uL   Eosinophils Relative 1  0 - 5 %   Eosinophils Absolute 0.1  0.0 - 0.7 K/uL   Basophils Relative 0  0 - 1 %   Basophils Absolute 0.0  0.0 - 0.1 K/uL  HEPATIC FUNCTION PANEL     Status: Abnormal   Collection Time    11/16/13  3:17 AM      Result Value Ref Range   Total Protein 7.6  6.0 - 8.3 g/dL   Albumin 3.9  3.5 - 5.2 g/dL   AST 136 (*) 0 - 37 U/L   ALT 90 (*) 0 - 53 U/L   Alkaline Phosphatase 90  39 - 117 U/L   Total  Bilirubin 2.0 (*) 0.3 - 1.2 mg/dL   Bilirubin, Direct 1.4 (*) 0.0 - 0.3 mg/dL   Indirect Bilirubin 0.6  0.3 - 0.9 mg/dL   US Abdomen Limited  11/16/2013   CLINICAL DATA:  Abdominal pain, elevated liver functions. Concern for gallstones.  EXAM: US ABDOMEN LIMITED - RIGHT UPPER QUADRANT  COMPARISON:  No recent current studies  FINDINGS: Gallbladder:  Multiple gallstones are present, the largest measuring up to 1.3 cm in length. Gallstones are noted. Marked gallbladder wall thickness. Negative sonographic Murphy's sign.  Common bile duct:  Diameter: Borderline enlargement, 7 mm  Liver:  Coarse echotexture.  No focal lesions are identified.  IMPRESSION: Cholelithiasis. Borderline biliary ductal dilatation. Concern for acute cholecystitis despite negative Murphy's sign due to extreme gallbladder wall thickening.   Electronically Signed   By: Rolla Flatten M.D.   On: 11/16/2013 07:02    Review of Systems  Constitutional: Negative for fever, chills and malaise/fatigue.  Respiratory: Negative.   Cardiovascular: Negative for chest pain, palpitations, orthopnea and leg swelling.  Gastrointestinal: Positive for nausea and abdominal pain. Negative for vomiting, diarrhea and constipation.  Genitourinary: Negative.     Blood pressure 202/83, pulse 60, temperature 98 F (36.7 C), temperature source Oral, resp. rate 18, SpO2 98.00%. Physical Exam  General: Alert, Mildly obese African American male, in no distress Skin: Warm and dry without rash or infection. HEENT: No palpable masses or thyromegaly. Sclera nonicteric. Pupils equal round and reactive. Oropharynx clear. Lymph nodes: No cervical, supraclavicular, or inguinal nodes palpable. Lungs: Breath sounds clear and equal without increased work of breathing Cardiovascular: Regular rate and rhythm without murmur. No JVD or edema. Abdomen: Nondistended. Soft .Moderate epigastric and  right upper quadrant tenderness with some guarding, well localized,  remainder of his abdomen soft and nontender. No masses palpable. No organomegaly. No palpable hernias. Extremities: No edema or joint swelling or deformity. No chronic venous stasis changes. Neurologic: Alert and fully oriented. Gait normal.  Assessment/Plan Recurrent abdominal pain and now is severe acute episode entirely consistent with biliary tract disease. He has evidence of significant cholecystitis with a markedly thickened gallbladder wall and significant abdominal tenderness. I believe he needs an emergency cholecystectomy. He has some cardiac issues but these appear stable. I have recommended proceeding with laparoscopic cholecystectomy.I discussed the procedure in detail.    We discussed the risks and benefits of a laparoscopic cholecystectomy and possible cholangiogram including, but not limited to bleeding, infection, injury to surrounding structures such as the intestine or liver, bile leak, retained gallstones, need to convert to an open procedure, prolonged diarrhea, blood clots such as  DVT, common bile duct injury, anesthesia risks, and possible need for additional procedures.  The likelihood of improvement in symptoms and return to the patient's normal status is good. We discussed the typical post-operative recovery course.  Austen Wygant T 11/16/2013, 8:58 AM

## 2013-11-16 NOTE — Care Management Note (Addendum)
    Page 1 of 1   11/17/2013     11:48:52 AM CARE MANAGEMENT NOTE 11/17/2013  Patient:  Bradley, [name]   Account Number:  [id number]  Date Initiated:  11/16/2013  Documentation initiated by:  Dessa Phi  Subjective/Objective Assessment:   58 Y/O M ADMITTED W/CHOLELITHIASIS.     Action/Plan:   FROM HOME.   Anticipated DC Date:  11/17/2013   Anticipated DC Plan:  [place]  CM consult      Choice offered to / List presented to:             Status of service:  Completed, signed off Medicare Important Message given?   (If response is "NO", the following Medicare IM given date fields will be blank) Date Medicare IM given:   Medicare IM given by:   Date Additional Medicare IM given:   Additional Medicare IM given by:    Discharge Disposition:  HOME/SELF CARE  Per UR Regulation:  Reviewed for med. necessity/level of care/duration of stay  If discussed at [place] of Stay Meetings, dates discussed:    Comments:  11/17/13 Ariyanah Aguado RN,BSN NCM [phone] D/C HOME NO [place].  11/16/13 Omer Monter RN,BSN NCM [phone] S/P [facility].NO ANTICIPATED D/C NEEDS.

## 2013-11-16 NOTE — Op Note (Signed)
Preoperative Diagnosis: Cholelithiasis and cholecystitis  Postoprative Diagnosis: Same  Procedure: Procedure(s): LAPAROSCOPIC CHOLECYSTECTOMY WITH INTRAOPERATIVE CHOLANGIOGRAM   Surgeon: [name] T   Assistants: Autumn Messing  Anesthesia:  General endotracheal anesthesia  Indications: Patient is a 58 year old diabetic male who presents with acute severe epigastric abdominal pain. Gallbladder ultrasound that showed a markedly thickened gallbladder wall and cholelithiasis. He is mildly elevated LFTs and localized tenderness. I recommend proceeding with laparoscopic cholecystectomy with cholangiogram for apparent acute cholecystitis. We discussed the indications in nature the surgery and risks detailed extensively elsewhere and he agrees to proceed    Procedure Detail:  Patient was brought to the operating room, placed in the supine position on the operating table and general endotracheal anesthesia induced. He was given broad-spectrum IV antibiotics. PAS were in place. The abdomen was widely sterilely prepped and draped. Correct patient and procedure were verified. Local anesthesia was used to infiltrate the trocar sites. Access was obtained with a 1 cm incision and open Hassan technique above the umbilicus in the midline for a mattress suture of 0 Vicryl and pneumoperitoneum established. Under direct vision a 11 mm trocar was placed subxiphoid and 25 mm trochars along the right subcostal margin. There were both chronic and acute adhesions of omentum and fibrofatty tissue up to the edge around the gallbladder. These were taken down with careful cautery dissection and the fundus exposed and elevated superior laterally. It was edematous and somewhat inflamed but not severely and acutely inflamed or gangrenous. Dissection continued down the gallbladder and the infundibulum was exposed and retracted. Further fibrofatty tissue was stripped off close triangle and peritoneum anterior and posterior to  close triangle was incised. The distal gallbladder was thoroughly dissected. The cystic artery was dissected and isolated clearly coursing up onto the gallbladder wall and was divided between 2 proximal and one distal clip. The distal gallbladder was dissected 360 and the cystic duct gallbladder junction dissected and the cystic duct isolated for about 2 cm with a good critical view. The cystic duct was clipped at the gallbladder junction and operative cholangiograms obtained. This showed good filling of the normal common bile duct and intrahepatic ducts with free flow into the duodenum and no filling defects. The cholangiocatheter was removed the cystic duct triply clipped proximally and divided. The gallbladder was dissected free from its bed using hook cautery and removed with an Endo Catch bag. Hemostasis was obtained in the gallbladder bed with cautery and a Surgicel pack. Those of bleeding or injury or other problems. All CO2 was evacuated and trochars removed. A mattress suture was secured. Skin incisions are closed with subcuticular Monocryl and Dermabond. Sponge needle and instrument counts were correct appear    Findings: As above  Estimated Blood Loss:  less than 50 mL         Drains: None  Blood Given: none          Specimens: Gallbladder and contents        Complications:  * No complications entered in OR log *         Disposition: PACU - hemodynamically stable.         Condition: stable

## 2013-11-16 NOTE — ED Notes (Signed)
Urine collected.

## 2013-11-16 NOTE — Transfer of Care (Signed)
Immediate Anesthesia Transfer of Care Note  Patient: [name]  Procedure(s) Performed: Procedure(s): LAPAROSCOPIC CHOLECYSTECTOMY WITH INTRAOPERATIVE CHOLANGIOGRAM (N/A)  Patient Location: PACU  Anesthesia Type:General  Level of Consciousness: awake, alert , oriented and patient cooperative  Airway & Oxygen Therapy: Patient Spontanous Breathing and Patient connected to face mask oxygen  Post-op Assessment: Report given to PACU RN and Post -op Vital signs reviewed and stable  Post vital signs: Reviewed and stable  Complications: No apparent anesthesia complications

## 2013-11-16 NOTE — ED Notes (Signed)
Pt states he started developing left sided abd pain yesterday morning. Pain described as sharp, constant. Pain started gradually. Denies nausea, vomiting, diarrhea, or fever.

## 2013-11-16 NOTE — ED Notes (Signed)
Ultrasound in room

## 2013-11-16 NOTE — ED Notes (Signed)
Pt complains of abd pain all day, he states that he feels better now, no vomiting or diarrhea, he states he takes a lot of [employer]

## 2013-11-16 NOTE — Anesthesia Preprocedure Evaluation (Signed)
Anesthesia Evaluation  Patient identified by MRN, date of birth, ID band Patient awake    Reviewed: Allergy & Precautions, H&P , NPO status , Patient's Chart, lab work & pertinent test results  History of Anesthesia Complications Negative for: history of anesthetic complications  Airway Mallampati: III TM Distance: >3 FB Neck ROM: Full    Dental  (+) Poor Dentition, Missing, Dental Advisory Given   Pulmonary asthma ,  breath sounds clear to auscultation  Pulmonary exam normal       Cardiovascular Exercise Tolerance: Good hypertension, Pt. on home beta blockers and Pt. on medications + CAD and +CHF negative cardio ROS  Rhythm:Regular Rate:Normal  Hx of nonischemic cardiomyopathy, last ECHO 2/15 with improved EF of 40-45%, denies any worsening of symptoms, denies chest pain with activity. Reports METS >4, denies edema, PND, DOE   Neuro/Psych negative neurological ROS  negative psych ROS   GI/Hepatic negative GI ROS, Neg liver ROS,   Endo/Other  negative endocrine ROSdiabetes, Type 2, Oral Hypoglycemic Agents  Renal/GU negative Renal ROS  negative genitourinary   Musculoskeletal negative musculoskeletal ROS (+)   Abdominal   Peds negative pediatric ROS (+)  Hematology negative hematology ROS (+)   Anesthesia Other Findings   Reproductive/Obstetrics negative OB ROS                           Anesthesia Physical Anesthesia Plan  ASA: III  Anesthesia Plan: General   Post-op Pain Management:    Induction: Intravenous  Airway Management Planned: Oral ETT  Additional Equipment:   Intra-op Plan:   Post-operative Plan: Extubation in OR  Informed Consent: I have reviewed the patients History and Physical, chart, labs and discussed the procedure including the risks, benefits and alternatives for the proposed anesthesia with the patient or authorized representative who has indicated his/her  understanding and acceptance.   Dental advisory given  Plan Discussed with: CRNA  Anesthesia Plan Comments:         Anesthesia Quick Evaluation

## 2013-11-16 NOTE — ED Provider Notes (Addendum)
CSN: [id number]     Arrival date & time 11/16/13  0117 History   First MD Initiated Contact with Patient 11/16/13 [phone]     Chief Complaint  Patient presents with  . Abdominal Pain     (Consider location/radiation/quality/duration/timing/severity/associated sxs/prior Treatment) HPI 58-year-old male presents to emergency room with abdominal pain it started yesterday morning after having breakfast.  Pain was sharp and crampy in nature.  The pain was persistent throughout the day.  Pain resolved soon after coming to the emergency department.  He denies any fever or chills, no nausea or vomiting.  Patient attributes pain to taking too much of the powder.  Patient reports he takes Goody powder up to 3 times a day.  He denies any dark black tarry stools.  No prior history of similar pain.  He has history of hypertension, hyperlipidemia, diabetes. Past Medical History  Diagnosis Date  . Asthma   . CAD     nonobst cath 2004  . CARDIOMYOPATHY     NICM, LVEF 30% 01/2011 echo; 20-25% 07/2012 echo  . DIABETES MELLITUS, TYPE II   . HYPERCHOLESTEROLEMIA   . HYPERTENSION   . PSA, INCREASED   . Noncompliance   . CHF (congestive heart failure)   . PVCs (premature ventricular contractions)     s/p ablation 01-07-2013 by Dr Rayann Heman   Past Surgical History  Procedure Laterality Date  . Cardiac catheterization  2004    nonobst dz  . Surgery scrotal / testicular      at age 76  . Ablation  01-07-2013    RVOT PVC's ablated by Dr Rayann Heman along anteroseptal RVOT   Family History  Problem Relation Age of Onset  . Peripheral vascular disease    . Hypertension    . Prostate cancer    . Hypertension Mother   . Peripheral vascular disease Mother   . Prostate cancer Brother    History  Substance Use Topics  . Smoking status: Never Smoker   . 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 Alcohol Use: No     Comment: rare    Review of Systems  See History of Present Illness; otherwise all other systems are reviewed and negative  Allergies  Ambien  Home Medications   Prior to Admission medications   Medication Sig Start Date End Date Taking? Authorizing Provider  aspirin EC 81 MG tablet Take 81 mg by mouth daily.   Yes Historical Provider, MD  Aspirin-Acetaminophen-Caffeine (GOODYS EXTRA STRENGTH) [phone] MG PACK Take 1 packet by mouth every 6 (six) hours as needed (for pain).   Yes Historical Provider, MD  atorvastatin (LIPITOR) 20 MG tablet Take 20 mg by mouth daily.   Yes Historical Provider, MD  carvedilol (COREG) 12.5 MG tablet Take 12.5 mg by mouth 2 (two) times daily.   Yes Historical Provider, MD  furosemide (LASIX) 40 MG tablet Take 40 mg by mouth daily.   Yes Historical Provider, MD  glimepiride (AMARYL) 2 MG tablet Take 2 mg by mouth daily.   Yes Historical Provider, MD  hydrALAZINE (APRESOLINE) 50 MG tablet Take 50 mg by mouth 3 (three) times daily.   Yes Historical Provider, MD  isosorbide mononitrate (IMDUR) 30 MG 24 hr tablet Take 60 mg by mouth daily.   Yes Historical Provider, MD  lisinopril (PRINIVIL,ZESTRIL) 20 MG tablet Take 20 mg by mouth 2 (two) times daily.   Yes Historical Provider, MD  BP 188/83  Pulse 66  Temp(Src) 98 F (36.7 C) (Oral)  Resp 18  SpO2 100% Physical Exam  Nursing note and vitals reviewed. Constitutional: He is oriented to person, place, and time. He appears well-developed and well-nourished.  HENT:  Head: Normocephalic and atraumatic.  Nose: Nose normal.  Mouth/Throat: Oropharynx is clear and moist.  Eyes: Conjunctivae and EOM are normal. Pupils are equal, round, and reactive to light.  Neck: Normal range of motion. Neck supple. No JVD present. No tracheal deviation present. No thyromegaly present.  Cardiovascular: Normal rate, regular rhythm, normal heart sounds and intact distal pulses.  Exam reveals no gallop and no friction rub.   No murmur heard. Pulmonary/Chest: Effort  normal and breath sounds normal. No stridor. No respiratory distress. He has no wheezes. He has no rales. He exhibits no tenderness.  Abdominal: Soft. Bowel sounds are normal. He exhibits no distension and no mass. There is tenderness (mild epigastric tenderness). There is no rebound and no guarding.  Musculoskeletal: Normal range of motion. He exhibits no edema and no tenderness.  Lymphadenopathy:    He has no cervical adenopathy.  Neurological: He is alert and oriented to person, place, and time. He displays normal reflexes. He exhibits normal muscle tone. Coordination normal.  Skin: Skin is warm and dry. No rash noted. No erythema. No pallor.  Psychiatric: He has a normal mood and affect. His behavior is normal. Judgment and thought content normal.    ED Course  Procedures (including critical care time) Labs Review Labs Reviewed  AMYLASE - Abnormal; Notable for the following:    Amylase 118 (*)    All other components within normal limits  BASIC METABOLIC PANEL - Abnormal; Notable for the following:    Glucose, Bld 137 (*)    GFR calc non Af Amer 67 (*)    GFR calc Af Amer 78 (*)    All other components within normal limits  CBC WITH DIFFERENTIAL - Abnormal; Notable for the following:    Neutrophils Relative % 78 (*)    All other components within normal limits  HEPATIC FUNCTION PANEL - Abnormal; Notable for the following:    AST 136 (*)    ALT 90 (*)    Total Bilirubin 2.0 (*)    Bilirubin, Direct 1.4 (*)    All other components within normal limits  LIPASE, BLOOD  URINALYSIS, ROUTINE W REFLEX MICROSCOPIC    Imaging Review US Abdomen Limited  11/16/2013   CLINICAL DATA:  Abdominal pain, elevated liver functions. Concern for gallstones.  EXAM: US ABDOMEN LIMITED - RIGHT UPPER QUADRANT  COMPARISON:  No recent current studies  FINDINGS: Gallbladder:  Multiple gallstones are present, the largest measuring up to 1.3 cm in length. Gallstones are noted. Marked gallbladder wall  thickness. Negative sonographic Murphy's sign.  Common bile duct:  Diameter: Borderline enlargement, 7 mm  Liver:  Coarse echotexture.  No focal lesions are identified.  IMPRESSION: Cholelithiasis. Borderline biliary ductal dilatation. Concern for acute cholecystitis despite negative Murphy's sign due to extreme gallbladder wall thickening.   Electronically Signed   By: Rolla Flatten M.D.   On: 11/16/2013 07:02     EKG Interpretation None      MDM   Final diagnoses:  Cholecystitis   58 year old male with upper abdominal pain yesterday morning after eating breakfast.  His amylase AST and ALT, bilirubin are all elevated.  Patient does not drink alcohol.  No prior history of similar pain.  Plan for right upper quadrant ultrasound  for possible gallstones.  If negative, patient will need close followup with his primary care Dr. and referral to GI if LFTs are persistently elevated.    Kalman Drape, MD 11/16/13 0631  7:14 AM Contacted by radiology, patient's ultrasound is concerning for acute cholecystitisdue to significant gallbladder wall thickening, and ductal dilatation.  Recommend surgery consult.  Kalman Drape, MD 11/16/13 [place], MD 11/16/13 [phone]

## 2013-11-16 NOTE — Progress Notes (Signed)
PROGRESS NOTE  Subjective:   58 yo with hx of mild chronic systolic CHF - EF 98%. Admitted with abdominal pain - had lap chole earlier today.   No recent cardiac issues.  Is a patient of [facility]  meds at home include  atorvastatin 20 Coreg 12. 5 bid Lasix 40 a day Hydralazine 50 TID imdur 60 mg a day Lisinopril 20 mg BID  And also amaryl 2 mg a day  Has done well s/p lap chole.  Mild abdominal tenderness but overall feels ok   Objective:    Vital Signs:   Temp:  [97.7 F (36.5 C)-98.9 F (37.2 C)] 97.7 F (36.5 C) (10/21 1349) Pulse Rate:  [53-90] 90 (10/21 1504) Resp:  [10-21] 18 (10/21 1349) BP: (163-213)/(67-92) 187/92 mmHg (10/21 1504) SpO2:  [98 %-100 %] 100 % (10/21 1349) Weight:  [195 lb (88.451 kg)] 195 lb (88.451 kg) (10/21 1504)  Last BM Date: 11/15/13   24-hour weight change: Weight change:   Weight trends: Filed Weights   11/16/13 1504  Weight: 195 lb (88.451 kg)    Intake/Output:    Total I/O In: 1050 [I.V.:1050] Out: 150 [Urine:150]   Physical Exam: BP 187/92  Pulse 90  Temp(Src) 97.7 F (36.5 C) (Oral)  Resp 18  Ht 5\' 5"  (1.651 m)  Wt 195 lb (88.451 kg)  BMI 32.45 kg/m2  SpO2 100%  Wt Readings from Last 3 Encounters:  11/16/13 195 lb (88.451 kg)  11/16/13 195 lb (88.451 kg)  10/31/13 194 lb 3.2 oz (88.089 kg)    General: Vital signs reviewed and noted.   Head: Normocephalic, atraumatic.  Eyes: conjunctivae/corneas clear.  EOM's intact.   Throat: normal  Neck:  normal   Lungs:    clear  Heart:  RR  Abdomen:  + BS   Extremities: No edema    Neurologic: A&O X3, CN II - XII are grossly intact.   Psych: Normal     Labs: BMET:  Recent Labs  11/16/13 0247  NA 138  K 4.1  CL 104  CO2 21  GLUCOSE 137*  BUN 14  CREATININE 1.16  CALCIUM 9.3    Liver function tests:  Recent Labs  11/16/13 0317  AST 136*  ALT 90*  ALKPHOS 90  BILITOT 2.0*  PROT 7.6  ALBUMIN 3.9    Recent Labs  11/16/13 0247    LIPASE 29  AMYLASE 118*    CBC:  Recent Labs  11/16/13 0247  WBC 9.5  NEUTROABS 7.4  HGB 14.2  HCT 42.0  MCV 90.3  PLT 256    Cardiac Enzymes: No results found for this basename: CKTOTAL, CKMB, TROPONINI,  in the last 72 hours  Coagulation Studies: No results found for this basename: LABPROT, INR,  in the last 72 hours  Other: No components found with this basename: POCBNP,  No results found for this basename: DDIMER,  in the last 72 hours No results found for this basename: HGBA1C,  in the last 72 hours No results found for this basename: CHOL, HDL, LDLCALC, TRIG, CHOLHDL,  in the last 72 hours No results found for this basename: TSH, T4TOTAL, FREET3, T3FREE, THYROIDAB,  in the last 72 hours No results found for this basename: VITAMINB12, FOLATE, FERRITIN, TIBC, IRON, RETICCTPCT,  in the last 72 hours   Other results:  EKG:  NSR , LVH    Medications:    Infusions: . 0.9 % NaCl with KCl 20 mEq / L 75 mL/hr  at 11/16/13 1402    Scheduled Medications: . atorvastatin  20 mg Oral Daily  . carvedilol  12.5 mg Oral BID  . furosemide  40 mg Oral Daily  . heparin  5,000 Units Subcutaneous 3 times per day  . hydrALAZINE  50 mg Oral TID  . insulin aspart  0-15 Units Subcutaneous TID WC  . isosorbide mononitrate  60 mg Oral Daily  . lisinopril  20 mg Oral BID    Assessment/ Plan:     1.   Cholelithiasis and cholecystitis without obstruction  2. Chronic systolic CHF:  He has been very stable.  He has + BS and should be able to eat fairly soon. His BP is elevated and he has been started on his home meds at this point. No evidence of worsening CHF at this point   Will follow along with you    Disposition:  Length of Stay: 0  Thayer Headings, Brooke Bonito., MD, [facility] 11/16/2013, 4:46 PM Office [phone] Pager [phone]

## 2013-11-17 ENCOUNTER — Encounter (HOSPITAL_COMMUNITY): Payer: Self-pay | Admitting: General Surgery

## 2013-11-17 LAB — COMPREHENSIVE METABOLIC PANEL
ALT: 372 U/L — ABNORMAL HIGH (ref 0–53)
AST: 384 U/L — ABNORMAL HIGH (ref 0–37)
Albumin: 3.3 g/dL — ABNORMAL LOW (ref 3.5–5.2)
Alkaline Phosphatase: 139 U/L — ABNORMAL HIGH (ref 39–117)
Anion gap: 11 (ref 5–15)
BUN: 13 mg/dL (ref 6–23)
CO2: 22 mEq/L (ref 19–32)
Calcium: 8.8 mg/dL (ref 8.4–10.5)
Chloride: 105 mEq/L (ref 96–112)
Creatinine, Ser: 1.33 mg/dL (ref 0.50–1.35)
GFR calc Af Amer: 66 mL/min — ABNORMAL LOW (ref >90)
GFR calc non Af Amer: 57 mL/min — ABNORMAL LOW (ref >90)
Glucose, Bld: 131 mg/dL — ABNORMAL HIGH (ref 70–99)
Potassium: 4.2 mEq/L (ref 3.7–5.3)
Sodium: 138 mEq/L (ref 137–147)
Total Bilirubin: 2.4 mg/dL — ABNORMAL HIGH (ref 0.3–1.2)
Total Protein: 6.6 g/dL (ref 6.0–8.3)

## 2013-11-17 LAB — CBC
HCT: 38.2 % — ABNORMAL LOW (ref 39.0–52.0)
Hemoglobin: 12.8 g/dL — ABNORMAL LOW (ref 13.0–17.0)
MCH: 29.7 pg (ref 26.0–34.0)
MCHC: 33.5 g/dL (ref 30.0–36.0)
MCV: 88.6 fL (ref 78.0–100.0)
Platelets: 256 10*3/uL (ref 150–400)
RBC: 4.31 MIL/uL (ref 4.22–5.81)
RDW: 12.7 % (ref 11.5–15.5)
WBC: 11.3 10*3/uL — ABNORMAL HIGH (ref 4.0–10.5)

## 2013-11-17 LAB — GLUCOSE, CAPILLARY
Glucose-Capillary: 189 mg/dL — ABNORMAL HIGH (ref 70–99)
Glucose-Capillary: 114 mg/dL — ABNORMAL HIGH (ref 70–99)

## 2013-11-17 LAB — TROPONIN I: Troponin I: <0.3 ng/mL (ref <0.30)

## 2013-11-17 MED ORDER — OXYCODONE-ACETAMINOPHEN 5-325 MG PO TABS: 1.0000 | ORAL_TABLET | Freq: Four times a day (QID) | ORAL | Status: DC | PRN

## 2013-11-17 NOTE — Anesthesia Postprocedure Evaluation (Signed)
  Anesthesia Post-op Note  Patient: [name]  Procedure(s) Performed: Procedure(s) (LRB): LAPAROSCOPIC CHOLECYSTECTOMY WITH INTRAOPERATIVE CHOLANGIOGRAM (N/A)  Patient Location: PACU  Anesthesia Type: MAC and General  Level of Consciousness: awake and alert   Airway and Oxygen Therapy: Patient Spontanous Breathing  Post-op Pain: mild  Post-op Assessment: Post-op Vital signs reviewed, Patient's Cardiovascular Status Stable, Respiratory Function Stable, Patent Airway and No signs of Nausea or vomiting  Last Vitals:  Filed Vitals:   11/17/13 0357  BP: 136/77  Pulse: 78  Temp: 36.9 C  Resp: 18    Post-op Vital Signs: stable   Complications: No apparent anesthesia complications

## 2013-11-17 NOTE — Discharge Summary (Signed)
Physician Discharge Summary  Patient ID: [name] MRN: [id number] DOB/AGE: 1955/05/22 58 y.o.  Admit date: 11/16/2013 Discharge date: 11/17/2013  Admitting Diagnosis: Cholelithiasis with cholecystitis   Discharge Diagnosis Patient Active Problem List   Diagnosis Date Noted  . Cholelithiasis and cholecystitis without obstruction 11/16/2013  . PVC's (premature ventricular contractions) 01/07/2013  . CKD (chronic kidney disease) stage 2, GFR 60-89 ml/min 08/25/2012  . OSA (obstructive sleep apnea) 08/25/2012  . Chronic systolic heart failure 34/19/3790  . HTN (hypertension), malignant 02/12/2011  . Non compliance w medication regimen 02/12/2011  . HYPERCHOLESTEROLEMIA 09/17/2009  . PREMATURE VENTRICULAR CONTRACTIONS 09/17/2009  . PSA, INCREASED 04/26/2009  . DIABETES MELLITUS, TYPE II 04/25/2009  . CARDIOMYOPATHY 04/25/2009  . CAD 04/20/2009    Consultants Cardiology---Dr. Marlou Porch  Imaging: Dg Cholangiogram Operative  11/16/2013   CLINICAL DATA:  History of gallstones. Evaluate for CBD stones during laparoscopic cholecystectomy.  EXAM: INTRAOPERATIVE CHOLANGIOGRAM  FLUOROSCOPY TIME:  10 seconds  COMPARISON:  Abdominal ultrasound - 11/16/2013  FINDINGS: Intraoperative angiographic images of the right upper abdominal quadrant during laparoscopic cholecystectomy are provided for review.  Surgical clips overlie the expected location of the gallbladder fossa.  Contrast injection demonstrates selective cannulation of the central aspect of the cystic duct.  There is passage of contrast through the central aspect of the cystic duct with filling of a non dilated common bile duct. There is passage of contrast though the CBD and into the descending portion of the duodenum.  There is minimal reflux of injected contrast into the common hepatic duct and central aspect of the non dilated intrahepatic biliary system.  There are no discrete filling defects within the opacified portions of the  biliary system to suggest the presence of choledocholithiasis.  IMPRESSION: No evidence of choledocholithiasis.   Electronically Signed   By: Sandi Mariscal M.D.   On: 11/16/2013 12:40   US Abdomen Limited  11/16/2013   CLINICAL DATA:  Abdominal pain, elevated liver functions. Concern for gallstones.  EXAM: US ABDOMEN LIMITED - RIGHT UPPER QUADRANT  COMPARISON:  No recent current studies  FINDINGS: Gallbladder:  Multiple gallstones are present, the largest measuring up to 1.3 cm in length. Gallstones are noted. Marked gallbladder wall thickness. Negative sonographic Murphy's sign.  Common bile duct:  Diameter: Borderline enlargement, 7 mm  Liver:  Coarse echotexture.  No focal lesions are identified.  IMPRESSION: Cholelithiasis. Borderline biliary ductal dilatation. Concern for acute cholecystitis despite negative Murphy's sign due to extreme gallbladder wall thickening.   Electronically Signed   By: Rolla Flatten M.D.   On: 11/16/2013 07:02    Procedures Laparoscopic cholecystectomy with IOC---Dr. [name] 11/16/13  Hospital Course:  [name] who presented to [facility] with abdominal pain.  Workup showed cholelithiasis with acute cholecystitis.  Patient was admitted and underwent procedure listed above.  He was noted to have some ST changes post operatively and therefore cardiology was consulted.  He was felt to be stable from cardiac standpoint.  Tolerated procedure well and was transferred to the floor.  Diet was advanced as tolerated.  On POD#1, the patient was voiding well, tolerating diet, ambulating well, pain well controlled, vital signs stable, incisions c/d/i and felt stable for discharge home.  Patient will follow up in our office in 3 weeks and knows to call with questions or concerns.  Physical Exam: General:  Alert, NAD, pleasant, comfortable Abd:  Soft, ND, mild tenderness, incisions C/D/I    Medication List         aspirin EC  81 MG tablet  Take 81 mg by mouth daily.      atorvastatin 20 MG tablet  Commonly known as:  LIPITOR  Take 20 mg by mouth daily.     carvedilol 12.5 MG tablet  Commonly known as:  COREG  Take 12.5 mg by mouth 2 (two) times daily.     furosemide 40 MG tablet  Commonly known as:  LASIX  Take 40 mg by mouth daily.     glimepiride 2 MG tablet  Commonly known as:  AMARYL  Take 2 mg by mouth daily.     GOODYS EXTRA STRENGTH 500-325-65 MG Pack  Generic drug:  Aspirin-Acetaminophen-Caffeine  Take 1 packet by mouth every 6 (six) hours as needed (for pain).     hydrALAZINE 50 MG tablet  Commonly known as:  APRESOLINE  Take 50 mg by mouth 3 (three) times daily.     isosorbide mononitrate 30 MG 24 hr tablet  Commonly known as:  IMDUR  Take 60 mg by mouth daily.     lisinopril 20 MG tablet  Commonly known as:  PRINIVIL,ZESTRIL  Take 20 mg by mouth 2 (two) times daily.     oxyCODONE-acetaminophen 5-325 MG per tablet  Commonly known as:  PERCOCET/ROXICET  Take 1-2 tablets by mouth every 6 (six) hours as needed for moderate pain.             Follow-up Information   Follow up with [facility] On 12/13/2013. (arrive by 1:15PM for 1:45PM for a post op check)    Contact information:   [address]   [place] [phone]       Signed: Erby Pian, [facility] [phone]  11/17/2013, 9:01 AM

## 2013-11-17 NOTE — Discharge Instructions (Signed)

## 2013-11-17 NOTE — Progress Notes (Addendum)
PROGRESS NOTE  Subjective:   58 yo with hx of mild chronic systolic CHF - EF 65%. Admitted with abdominal pain - had lap chole earlier yesterday   No recent cardiac issues.  Is a patient of [name]  meds at home include  atorvastatin 20 Coreg 12. 5 bid Lasix 40 a day Hydralazine 50 TID imdur 60 mg a day Lisinopril 20 mg BID  And also amaryl 2 mg a day  Has done well s/p lap chole. No chest pain or SOB. Mild abdominal tenderness but overall feels ok   Objective:    Vital Signs:   Temp:  [97.7 F (36.5 C)-99.4 F (37.4 C)] 98.4 F (36.9 C) (10/22 0357) Pulse Rate:  [53-108] 78 (10/22 0357) Resp:  [10-21] 18 (10/22 0357) BP: (136-213)/(67-96) 136/77 mmHg (10/22 0357) SpO2:  [98 %-100 %] 98 % (10/22 0357) Weight:  [195 lb (88.451 kg)] 195 lb (88.451 kg) (10/21 1504)  Last BM Date: 11/15/13   24-hour weight change: Weight change:   Weight trends: Filed Weights   11/16/13 1504  Weight: 195 lb (88.451 kg)    Intake/Output:  10/21 0701 - 10/22 0700 In: 2562.5 [P.O.:240; I.V.:2322.5] Out: 1170 [Urine:1170]     Physical Exam: BP 136/77  Pulse 78  Temp(Src) 98.4 F (36.9 C) (Oral)  Resp 18  Ht 5\' 5"  (1.651 m)  Wt 195 lb (88.451 kg)  BMI 32.45 kg/m2  SpO2 98%  Wt Readings from Last 3 Encounters:  11/16/13 195 lb (88.451 kg)  11/16/13 195 lb (88.451 kg)  10/31/13 194 lb 3.2 oz (88.089 kg)    General: Vital signs reviewed and noted.   Head: Normocephalic, atraumatic.  Eyes: conjunctivae/corneas clear.  EOM's intact.   Throat: normal  Neck:  normal   Lungs:    clear  Heart:  RR  Abdomen:  + BS protuberant  Extremities: No edema    Neurologic: A&O X3, CN II - XII are grossly intact.   Psych: Normal     Labs: BMET:  Recent Labs  11/16/13 0247 11/17/13 0130  NA 138 138  K 4.1 4.2  CL 104 105  CO2 21 22  GLUCOSE 137* 131*  BUN 14 13  CREATININE 1.16 1.33  CALCIUM 9.3 8.8    Liver function tests:  Recent Labs  11/16/13 0317  11/17/13 0130  AST 136* 384*  ALT 90* 372*  ALKPHOS 90 139*  BILITOT 2.0* 2.4*  PROT 7.6 6.6  ALBUMIN 3.9 3.3*    Recent Labs  11/16/13 0247  LIPASE 29  AMYLASE 118*    CBC:  Recent Labs  11/16/13 0247 11/17/13 0130  WBC 9.5 11.3*  NEUTROABS 7.4  --   HGB 14.2 12.8*  HCT 42.0 38.2*  MCV 90.3 88.6  PLT 256 256    Cardiac Enzymes:  Recent Labs  11/16/13 1851 11/16/13 2220 11/17/13 0130  TROPONINI <0.30 <0.30 <0.30     Other results:  EKG:  NSR , LVH   Medications:    Infusions: . 0.9 % NaCl with KCl 20 mEq / L 75 mL/hr at 11/17/13 0534    Scheduled Medications: . atorvastatin  20 mg Oral Daily  . carvedilol  12.5 mg Oral BID  . furosemide  40 mg Oral Daily  . heparin  5,000 Units Subcutaneous 3 times per day  . hydrALAZINE  50 mg Oral TID  . Influenza vac split quadrivalent PF  0.5 mL Intramuscular Tomorrow-1000  . insulin aspart  0-15 Units  Subcutaneous TID WC  . isosorbide mononitrate  60 mg Oral Daily  . lisinopril  20 mg Oral BID  . pneumococcal 23 valent vaccine  0.5 mL Intramuscular Tomorrow-1000    Assessment/ Plan:    1. Cholelithiasis and cholecystitis without obstruction  2. Chronic systolic CHF:  He has been very stable.  His BP is now normal after starting on his home meds. No evidence of worsening CHF at this point. Tele normal.   Will follow along with you    Disposition:  Length of Stay: [place] MD, [facility] 11/17/2013, 7:42 AM Office [phone]

## 2013-11-22 ENCOUNTER — Encounter (INDEPENDENT_AMBULATORY_CARE_PROVIDER_SITE_OTHER): Payer: Self-pay | Admitting: *Deleted

## 2013-12-02 ENCOUNTER — Other Ambulatory Visit (HOSPITAL_COMMUNITY): Payer: Self-pay | Admitting: Urology

## 2013-12-02 DIAGNOSIS — R972 Elevated prostate specific antigen [PSA]: Secondary | ICD-10-CM

## 2013-12-07 ENCOUNTER — Ambulatory Visit (HOSPITAL_COMMUNITY): Payer: BC Managed Care – PPO

## 2013-12-07 ENCOUNTER — Encounter (HOSPITAL_COMMUNITY): Payer: BC Managed Care – PPO

## 2013-12-07 ENCOUNTER — Encounter (HOSPITAL_COMMUNITY)
Admission: RE | Admit: 2013-12-07 | Discharge: 2013-12-07 | Disposition: A | Discharge status: Home / Self Care | Payer: BC Managed Care – PPO | Source: Ambulatory Visit | Attending: Urology | Admitting: Urology

## 2013-12-07 ENCOUNTER — Encounter (HOSPITAL_COMMUNITY)
Admission: RE | Admit: 2013-12-07 | Discharge: 2013-12-07 | Disposition: A | Discharge status: Home / Self Care | Payer: BC Managed Care – PPO | Source: Ambulatory Visit | Attending: Diagnostic Radiology | Admitting: Diagnostic Radiology

## 2013-12-07 ENCOUNTER — Encounter (HOSPITAL_COMMUNITY): Payer: Self-pay

## 2013-12-07 DIAGNOSIS — R972 Elevated prostate specific antigen [PSA]: Secondary | ICD-10-CM | POA: Insufficient documentation

## 2013-12-07 MED ORDER — TECHNETIUM TC 99M MEDRONATE IV KIT
25.7000 | PACK | Freq: Once | INTRAVENOUS | Status: AC | PRN
  Administered 2013-12-07: 25.7 via INTRAVENOUS

## 2013-12-13 ENCOUNTER — Other Ambulatory Visit (HOSPITAL_COMMUNITY): Payer: Self-pay | Admitting: Urology

## 2013-12-13 DIAGNOSIS — C61 Malignant neoplasm of prostate: Secondary | ICD-10-CM

## 2013-12-30 ENCOUNTER — Other Ambulatory Visit (HOSPITAL_COMMUNITY): Payer: Self-pay | Admitting: Cardiology

## 2013-12-30 DIAGNOSIS — I5022 Chronic systolic (congestive) heart failure: Secondary | ICD-10-CM

## 2013-12-30 MED ORDER — HYDRALAZINE HCL 50 MG PO TABS: 50.0000 mg | ORAL_TABLET | Freq: Three times a day (TID) | ORAL | Status: DC

## 2014-01-05 ENCOUNTER — Encounter (HOSPITAL_COMMUNITY): Payer: Self-pay | Admitting: Internal Medicine

## 2014-01-06 ENCOUNTER — Encounter: Payer: Self-pay | Admitting: Internal Medicine

## 2014-01-10 ENCOUNTER — Ambulatory Visit (HOSPITAL_COMMUNITY)
Admission: RE | Admit: 2014-01-10 | Discharge: 2014-01-10 | Disposition: A | Discharge status: Home / Self Care | Payer: BC Managed Care – PPO | Source: Ambulatory Visit | Attending: Urology | Admitting: Urology

## 2014-01-10 DIAGNOSIS — C61 Malignant neoplasm of prostate: Secondary | ICD-10-CM | POA: Insufficient documentation

## 2014-01-10 LAB — POCT I-STAT CREATININE: Creatinine, Ser: 1.4 mg/dL — ABNORMAL HIGH (ref 0.50–1.35)

## 2014-01-10 MED ORDER — GADOBENATE DIMEGLUMINE 529 MG/ML IV SOLN: 20.0000 mL | Freq: Once | INTRAVENOUS | Status: AC | PRN

## 2014-01-10 MED ORDER — GADOBENATE DIMEGLUMINE 529 MG/ML IV SOLN
20.0000 mL | Freq: Once | INTRAVENOUS | Status: AC | PRN
  Administered 2014-01-10: 17 mL via INTRAVENOUS

## 2014-01-23 ENCOUNTER — Encounter: Payer: Self-pay | Admitting: Internal Medicine

## 2014-02-08 ENCOUNTER — Other Ambulatory Visit: Payer: Self-pay | Admitting: Urology

## 2014-02-08 NOTE — Progress Notes (Signed)
Called Dr. Zettie Pho office and requested orders be released in Epic to sign and Held surgery 02-22-14 pre op 02-16-14 Thanks

## 2014-02-15 NOTE — Patient Instructions (Addendum)
[name]  02/15/2014   Your procedure is scheduled on: 02/22/2014    Report to [name] Main  Entrance and follow signs to               [name] at       1100 AM.  Call this number if you have problems the morning of surgery [phone]   Remember:  Do not eat food or drink liquids :After Midnight.     Take these medicines the morning of surgery with A SIP OF WATER: Coreg ( Carvedilol), Apresoline (Hydralazine), Imdur                                You may not have any metal on your body including hair pins and              piercings  Do not wear jewelry,  lotions, powders or perfumes., deodorant.                           Men may shave face and neck.   Do not bring valuables to the [name] [place].  Contacts, dentures or bridgework may not be worn into surgery.  Leave suitcase in the car. After surgery it may be brought to your room.         Special Instructions: coughing and deep breathing exercises, leg exercises               Please read over the following fact sheets you were given: _____________________________________________________________________             [name] - Preparing for Surgery Before surgery, you can play an important role.  Because skin is not sterile, your skin needs to be as free of germs as possible.  You can reduce the number of germs on your skin by washing with CHG (chlorahexidine gluconate) soap before surgery.  CHG is an antiseptic cleaner which kills germs and bonds with the skin to continue killing germs even after washing. Please DO NOT use if you have an allergy to CHG or antibacterial soaps.  If your skin becomes reddened/irritated stop using the CHG and inform your nurse when you arrive at Short Stay. Do not shave (including legs and underarms) for at least 48 hours prior to the first CHG shower.  You may shave your face/neck. Please follow  these instructions carefully:  1.  Shower with CHG Soap the night before surgery and the  morning of Surgery.  2.  If you choose to wash your hair, wash your hair first as usual with your  normal  shampoo.  3.  After you shampoo, rinse your hair and body thoroughly to remove the  shampoo.                           4.  Use CHG as you would any other liquid soap.  You can apply chg directly  to the skin and wash                       Gently with a scrungie or clean washcloth.  5.  Apply the  CHG Soap to your body ONLY FROM THE NECK DOWN.   Do not use on face/ open                           Wound or open sores. Avoid contact with eyes, ears mouth and genitals (private parts).                       Wash face,  Genitals (private parts) with your normal soap.             6.  Wash thoroughly, paying special attention to the area where your surgery  will be performed.  7.  Thoroughly rinse your body with warm water from the neck down.  8.  DO NOT shower/wash with your normal soap after using and rinsing off  the CHG Soap.                9.  Pat yourself dry with a clean towel.            10.  Wear clean pajamas.            11.  Place clean sheets on your bed the night of your first shower and do not  sleep with pets. Day of Surgery : Do not apply any lotions/deodorants the morning of surgery.  Please wear clean clothes to the [name]/surgery center.  FAILURE TO FOLLOW THESE INSTRUCTIONS MAY RESULT IN THE CANCELLATION OF YOUR SURGERY PATIENT SIGNATURE_________________________________  NURSE SIGNATURE__________________________________  ________________________________________________________________________  WHAT IS A BLOOD TRANSFUSION? Blood Transfusion Information  A transfusion is the replacement of blood or some of its parts. Blood is made up of multiple cells which provide different functions.  Red blood cells carry oxygen and are used for blood loss replacement.  White blood cells fight  against infection.  Platelets control bleeding.  Plasma helps clot blood.  Other blood products are available for specialized needs, such as hemophilia or other clotting disorders. BEFORE THE TRANSFUSION  Who gives blood for transfusions?   Healthy volunteers who are fully evaluated to make sure their blood is safe. This is blood bank blood. Transfusion therapy is the safest it has ever been in the practice of medicine. Before blood is taken from a donor, a complete history is taken to make sure that person has no history of diseases nor engages in risky social behavior (examples are intravenous drug use or sexual activity with multiple partners). The donor's travel history is screened to minimize risk of transmitting infections, such as malaria. The donated blood is tested for signs of infectious diseases, such as HIV and hepatitis. The blood is then tested to be sure it is compatible with you in order to minimize the chance of a transfusion reaction. If you or a relative donates blood, this is often done in anticipation of surgery and is not appropriate for emergency situations. It takes many days to process the donated blood. RISKS AND COMPLICATIONS Although transfusion therapy is very safe and saves many lives, the main dangers of transfusion include:   Getting an infectious disease.  Developing a transfusion reaction. This is an allergic reaction to something in the blood you were given. Every precaution is taken to prevent this. The decision to have a blood transfusion has been considered carefully by your caregiver before blood is given. Blood is not given unless the benefits outweigh the risks. AFTER THE TRANSFUSION  Right after receiving a blood transfusion,  you will usually feel much better and more energetic. This is especially true if your red blood cells have gotten low (anemic). The transfusion raises the level of the red blood cells which carry oxygen, and this usually causes an  energy increase.  The nurse administering the transfusion will monitor you carefully for complications. HOME CARE INSTRUCTIONS  No special instructions are needed after a transfusion. You may find your energy is better. Speak with your caregiver about any limitations on activity for underlying diseases you may have. SEEK MEDICAL CARE IF:   Your condition is not improving after your transfusion.  You develop redness or irritation at the intravenous (IV) site. SEEK IMMEDIATE MEDICAL CARE IF:  Any of the following symptoms occur over the next 12 hours:  Shaking chills.  You have a temperature by mouth above 102 F (38.9 C), not controlled by medicine.  Chest, back, or muscle pain.  People around you feel you are not acting correctly or are confused.  Shortness of breath or difficulty breathing.  Dizziness and fainting.  You get a rash or develop hives.  You have a decrease in urine output.  Your urine turns a dark color or changes to pink, red, or brown. Any of the following symptoms occur over the next 10 days:  You have a temperature by mouth above 102 F (38.9 C), not controlled by medicine.  Shortness of breath.  Weakness after normal activity.  The white part of the eye turns yellow (jaundice).  You have a decrease in the amount of urine or are urinating less often.  Your urine turns a dark color or changes to pink, red, or brown. Document Released: 01/11/2000 Document Revised: 04/07/2011 Document Reviewed: 08/30/2007 [facility] Patient Information 2014 [place], [place].  _______________________________________________________________________

## 2014-02-16 ENCOUNTER — Ambulatory Visit (HOSPITAL_COMMUNITY)
Admission: RE | Admit: 2014-02-16 | Discharge: 2014-02-16 | Disposition: A | Discharge status: Home / Self Care | Payer: BLUE CROSS/BLUE SHIELD | Source: Ambulatory Visit | Attending: Urology | Admitting: Urology

## 2014-02-16 ENCOUNTER — Encounter (HOSPITAL_COMMUNITY): Payer: Self-pay

## 2014-02-16 ENCOUNTER — Encounter (HOSPITAL_COMMUNITY)
Admission: RE | Admit: 2014-02-16 | Discharge: 2014-02-16 | Disposition: A | Discharge status: Home / Self Care | Payer: BLUE CROSS/BLUE SHIELD | Source: Ambulatory Visit | Attending: Urology | Admitting: Urology

## 2014-02-16 DIAGNOSIS — Z01818 Encounter for other preprocedural examination: Secondary | ICD-10-CM | POA: Diagnosis not present

## 2014-02-16 LAB — BASIC METABOLIC PANEL
Anion gap: 6 (ref 5–15)
BUN: 19 mg/dL (ref 6–23)
CO2: 26 mmol/L (ref 19–32)
Calcium: 9.2 mg/dL (ref 8.4–10.5)
Chloride: 109 mEq/L (ref 96–112)
Creatinine, Ser: 1.38 mg/dL — ABNORMAL HIGH (ref 0.50–1.35)
GFR calc Af Amer: 63 mL/min — ABNORMAL LOW (ref >90)
GFR calc non Af Amer: 54 mL/min — ABNORMAL LOW (ref >90)
Glucose, Bld: 96 mg/dL (ref 70–99)
Potassium: 4.7 mmol/L (ref 3.5–5.1)
Sodium: 141 mmol/L (ref 135–145)

## 2014-02-16 LAB — CBC
HCT: 42 % (ref 39.0–52.0)
Hemoglobin: 13.3 g/dL (ref 13.0–17.0)
MCH: 29.2 pg (ref 26.0–34.0)
MCHC: 31.7 g/dL (ref 30.0–36.0)
MCV: 92.1 fL (ref 78.0–100.0)
Platelets: 289 10*3/uL (ref 150–400)
RBC: 4.56 MIL/uL (ref 4.22–5.81)
RDW: 13.2 % (ref 11.5–15.5)
WBC: 6.5 10*3/uL (ref 4.0–10.5)

## 2014-02-16 NOTE — Progress Notes (Signed)
BMP results doen 02/16/14 faxed via EPIC to Dr Tresa Moore.

## 2014-02-16 NOTE — Progress Notes (Addendum)
EKG- 11/16/13 EPIC  PCP- LOV_ 09/01/13 EPIC  CHF - LOV visit- 10/31/13 EPIC  LOV - Dr [name] - 02/16/13 EPIC  Holter Report- 02/17/13 EPIC  03/21/13- ECHO - EF 40-45 %  12/10/12 Sleep Study documents-  12/14/2012 - LOV with Dr Gwenette Greet- EPIC  01/07/13- Vtach ablation- EPIC  MRI- ( Card)- 01/27/13 EPIC

## 2014-02-22 ENCOUNTER — Inpatient Hospital Stay (HOSPITAL_COMMUNITY)
Admission: RE | Admit: 2014-02-22 | Discharge: 2014-02-24 | DRG: 707 | Disposition: A | Discharge status: Home / Self Care | Payer: BLUE CROSS/BLUE SHIELD | Source: Ambulatory Visit | Attending: Urology | Admitting: Urology

## 2014-02-22 ENCOUNTER — Encounter (HOSPITAL_COMMUNITY)
Admission: RE | Disposition: A | Discharge status: Home / Self Care | Payer: Self-pay | Source: Ambulatory Visit | Attending: Urology

## 2014-02-22 ENCOUNTER — Encounter (HOSPITAL_COMMUNITY): Payer: Self-pay | Admitting: *Deleted

## 2014-02-22 ENCOUNTER — Inpatient Hospital Stay (HOSPITAL_COMMUNITY): Payer: BLUE CROSS/BLUE SHIELD | Admitting: Certified Registered Nurse Anesthetist

## 2014-02-22 DIAGNOSIS — C775 Secondary and unspecified malignant neoplasm of intrapelvic lymph nodes: Secondary | ICD-10-CM | POA: Diagnosis present

## 2014-02-22 DIAGNOSIS — Z8042 Family history of malignant neoplasm of prostate: Secondary | ICD-10-CM | POA: Diagnosis not present

## 2014-02-22 DIAGNOSIS — I2510 Atherosclerotic heart disease of native coronary artery without angina pectoris: Secondary | ICD-10-CM | POA: Diagnosis present

## 2014-02-22 DIAGNOSIS — C61 Malignant neoplasm of prostate: Secondary | ICD-10-CM | POA: Diagnosis present

## 2014-02-22 DIAGNOSIS — K429 Umbilical hernia without obstruction or gangrene: Secondary | ICD-10-CM | POA: Diagnosis present

## 2014-02-22 DIAGNOSIS — E119 Type 2 diabetes mellitus without complications: Secondary | ICD-10-CM | POA: Diagnosis present

## 2014-02-22 DIAGNOSIS — I10 Essential (primary) hypertension: Secondary | ICD-10-CM | POA: Diagnosis present

## 2014-02-22 HISTORY — PX: ROBOT ASSISTED LAPAROSCOPIC RADICAL PROSTATECTOMY: SHX5141

## 2014-02-22 HISTORY — PX: LYMPHADENECTOMY: SHX5960

## 2014-02-22 LAB — GLUCOSE, CAPILLARY
Glucose-Capillary: 82 mg/dL (ref 70–99)
Glucose-Capillary: 102 mg/dL — ABNORMAL HIGH (ref 70–99)
Glucose-Capillary: 125 mg/dL — ABNORMAL HIGH (ref 70–99)

## 2014-02-22 LAB — TYPE AND SCREEN
ABO/RH(D): O POS
Antibody Screen: NEGATIVE

## 2014-02-22 LAB — HEMOGLOBIN AND HEMATOCRIT, BLOOD
HCT: 35.7 % — ABNORMAL LOW (ref 39.0–52.0)
Hemoglobin: 11.6 g/dL — ABNORMAL LOW (ref 13.0–17.0)

## 2014-02-22 LAB — ABO/RH: ABO/RH(D): O POS

## 2014-02-22 SURGERY — ROBOTIC ASSISTED LAPAROSCOPIC RADICAL PROSTATECTOMY
Anesthesia: General

## 2014-02-22 MED ORDER — LACTATED RINGERS IV SOLN: INTRAVENOUS | Status: DC

## 2014-02-22 MED ORDER — LIDOCAINE HCL (CARDIAC) 20 MG/ML IV SOLN
INTRAVENOUS | Status: AC
  Filled 2014-02-22: qty 5

## 2014-02-22 MED ORDER — CEFAZOLIN SODIUM-DEXTROSE 2-3 GM-% IV SOLR
INTRAVENOUS | Status: AC
  Filled 2014-02-22: qty 50

## 2014-02-22 MED ORDER — DEXAMETHASONE SODIUM PHOSPHATE 10 MG/ML IJ SOLN
INTRAMUSCULAR | Status: AC
  Filled 2014-02-22: qty 1

## 2014-02-22 MED ORDER — HYDROMORPHONE HCL 1 MG/ML IJ SOLN
INTRAMUSCULAR | Status: DC | PRN
  Administered 2014-02-22 (×2): 0.5 mg via INTRAVENOUS

## 2014-02-22 MED ORDER — LACTATED RINGERS IV SOLN
INTRAVENOUS | Status: DC
  Administered 2014-02-22: 15:00:00 via INTRAVENOUS
  Administered 2014-02-22: 1000 mL via INTRAVENOUS

## 2014-02-22 MED ORDER — SODIUM CHLORIDE 0.9 % IJ SOLN
INTRAMUSCULAR | Status: AC
  Filled 2014-02-22: qty 10

## 2014-02-22 MED ORDER — NEOSTIGMINE METHYLSULFATE 10 MG/10ML IV SOLN
INTRAVENOUS | Status: AC
  Filled 2014-02-22: qty 1

## 2014-02-22 MED ORDER — NEOSTIGMINE METHYLSULFATE 10 MG/10ML IV SOLN
INTRAVENOUS | Status: DC | PRN
  Administered 2014-02-22: 4 mg via INTRAVENOUS

## 2014-02-22 MED ORDER — CISATRACURIUM BESYLATE (PF) 10 MG/5ML IV SOLN
INTRAVENOUS | Status: DC | PRN
  Administered 2014-02-22 (×3): 2 mg via INTRAVENOUS
  Administered 2014-02-22: 4 mg via INTRAVENOUS
  Administered 2014-02-22: 8 mg via INTRAVENOUS
  Administered 2014-02-22: 2 mg via INTRAVENOUS

## 2014-02-22 MED ORDER — PHENYLEPHRINE HCL 10 MG/ML IJ SOLN
10.0000 mg | INTRAVENOUS | Status: DC | PRN
  Administered 2014-02-22: 40 ug/min via INTRAVENOUS

## 2014-02-22 MED ORDER — SODIUM CHLORIDE 0.9 % IV BOLUS (SEPSIS)
1000.0000 mL | Freq: Once | INTRAVENOUS | Status: AC
  Administered 2014-02-22: 1000 mL via INTRAVENOUS

## 2014-02-22 MED ORDER — FENTANYL CITRATE 0.05 MG/ML IJ SOLN
INTRAMUSCULAR | Status: DC | PRN
  Administered 2014-02-22 (×5): 50 ug via INTRAVENOUS

## 2014-02-22 MED ORDER — ACETAMINOPHEN 500 MG PO TABS: 1000.0000 mg | ORAL_TABLET | Freq: Three times a day (TID) | ORAL | Status: DC

## 2014-02-22 MED ORDER — EPHEDRINE SULFATE 50 MG/ML IJ SOLN
INTRAMUSCULAR | Status: AC
  Filled 2014-02-22: qty 1

## 2014-02-22 MED ORDER — BUPIVACAINE LIPOSOME 1.3 % IJ SUSP
20.0000 mL | Freq: Once | INTRAMUSCULAR | Status: AC
  Administered 2014-02-22: 20 mL
  Filled 2014-02-22: qty 20

## 2014-02-22 MED ORDER — HYDROMORPHONE HCL 1 MG/ML IJ SOLN
0.2500 mg | INTRAMUSCULAR | Status: DC | PRN
  Administered 2014-02-22 (×4): 0.5 mg via INTRAVENOUS

## 2014-02-22 MED ORDER — ISOSORBIDE MONONITRATE ER 30 MG PO TB24
30.0000 mg | ORAL_TABLET | Freq: Two times a day (BID) | ORAL | Status: DC
  Administered 2014-02-22 – 2014-02-24 (×4): 30 mg via ORAL
  Filled 2014-02-22 (×4): qty 1

## 2014-02-22 MED ORDER — CISATRACURIUM BESYLATE 20 MG/10ML IV SOLN
INTRAVENOUS | Status: AC
  Filled 2014-02-22: qty 10

## 2014-02-22 MED ORDER — PROPOFOL 10 MG/ML IV BOLUS
INTRAVENOUS | Status: DC | PRN
  Administered 2014-02-22: 150 mg via INTRAVENOUS

## 2014-02-22 MED ORDER — OXYCODONE-ACETAMINOPHEN 5-325 MG PO TABS: 1.0000 | ORAL_TABLET | Freq: Four times a day (QID) | ORAL | Status: DC | PRN

## 2014-02-22 MED ORDER — PROPOFOL 10 MG/ML IV BOLUS
INTRAVENOUS | Status: AC
  Filled 2014-02-22: qty 20

## 2014-02-22 MED ORDER — SUCCINYLCHOLINE CHLORIDE 20 MG/ML IJ SOLN
INTRAMUSCULAR | Status: DC | PRN
  Administered 2014-02-22: 100 mg via INTRAVENOUS

## 2014-02-22 MED ORDER — GLYCOPYRROLATE 0.2 MG/ML IJ SOLN
INTRAMUSCULAR | Status: AC
  Filled 2014-02-22: qty 3

## 2014-02-22 MED ORDER — GLIMEPIRIDE 2 MG PO TABS
2.0000 mg | ORAL_TABLET | Freq: Every day | ORAL | Status: DC
  Administered 2014-02-23 – 2014-02-24 (×2): 2 mg via ORAL
  Filled 2014-02-22 (×2): qty 1

## 2014-02-22 MED ORDER — INSULIN ASPART 100 UNIT/ML ~~LOC~~ SOLN
0.0000 [IU] | Freq: Three times a day (TID) | SUBCUTANEOUS | Status: DC
  Administered 2014-02-23 – 2014-02-24 (×2): 2 [IU] via SUBCUTANEOUS

## 2014-02-22 MED ORDER — ONDANSETRON HCL 4 MG/2ML IJ SOLN
INTRAMUSCULAR | Status: DC | PRN
  Administered 2014-02-22: 4 mg via INTRAVENOUS

## 2014-02-22 MED ORDER — HYDROMORPHONE HCL 1 MG/ML IJ SOLN
INTRAMUSCULAR | Status: AC
  Filled 2014-02-22: qty 1

## 2014-02-22 MED ORDER — GLYCOPYRROLATE 0.2 MG/ML IJ SOLN
INTRAMUSCULAR | Status: AC
  Filled 2014-02-22: qty 1

## 2014-02-22 MED ORDER — SODIUM CHLORIDE 0.9 % IJ SOLN
INTRAMUSCULAR | Status: AC
  Filled 2014-02-22: qty 20

## 2014-02-22 MED ORDER — ACETAMINOPHEN 500 MG PO TABS
1000.0000 mg | ORAL_TABLET | Freq: Four times a day (QID) | ORAL | Status: DC
  Administered 2014-02-22 – 2014-02-23 (×3): 1000 mg via ORAL
  Filled 2014-02-22 (×6): qty 2

## 2014-02-22 MED ORDER — CIPROFLOXACIN HCL 500 MG PO TABS: 500.0000 mg | ORAL_TABLET | Freq: Two times a day (BID) | ORAL | Status: DC

## 2014-02-22 MED ORDER — HYDRALAZINE HCL 20 MG/ML IJ SOLN
INTRAMUSCULAR | Status: DC | PRN
  Administered 2014-02-22 (×2): 5 mg via INTRAVENOUS

## 2014-02-22 MED ORDER — ATORVASTATIN CALCIUM 20 MG PO TABS
20.0000 mg | ORAL_TABLET | Freq: Every day | ORAL | Status: DC
  Administered 2014-02-22 – 2014-02-24 (×3): 20 mg via ORAL
  Filled 2014-02-22 (×3): qty 1

## 2014-02-22 MED ORDER — CEFAZOLIN SODIUM-DEXTROSE 2-3 GM-% IV SOLR
2.0000 g | INTRAVENOUS | Status: AC
  Administered 2014-02-22: 2 g via INTRAVENOUS

## 2014-02-22 MED ORDER — HYDROMORPHONE HCL 2 MG/ML IJ SOLN
INTRAMUSCULAR | Status: AC
  Filled 2014-02-22: qty 1

## 2014-02-22 MED ORDER — LACTATED RINGERS IR SOLN
Status: DC | PRN
  Administered 2014-02-22: 1000 mL

## 2014-02-22 MED ORDER — ATROPINE SULFATE 0.4 MG/ML IJ SOLN
INTRAMUSCULAR | Status: DC | PRN
  Administered 2014-02-22: 0.4 mg via INTRAVENOUS

## 2014-02-22 MED ORDER — OXYCODONE HCL 5 MG PO TABS
5.0000 mg | ORAL_TABLET | ORAL | Status: DC | PRN
  Administered 2014-02-23 – 2014-02-24 (×5): 5 mg via ORAL
  Filled 2014-02-22 (×5): qty 1

## 2014-02-22 MED ORDER — LIDOCAINE HCL (CARDIAC) 20 MG/ML IV SOLN
INTRAVENOUS | Status: DC | PRN
  Administered 2014-02-22: 100 mg via INTRAVENOUS

## 2014-02-22 MED ORDER — FENTANYL CITRATE 0.05 MG/ML IJ SOLN
INTRAMUSCULAR | Status: AC
  Filled 2014-02-22: qty 5

## 2014-02-22 MED ORDER — ATROPINE SULFATE 0.4 MG/ML IJ SOLN
INTRAMUSCULAR | Status: AC
  Filled 2014-02-22: qty 1

## 2014-02-22 MED ORDER — SODIUM CHLORIDE 0.45 % IV SOLN
INTRAVENOUS | Status: DC
  Administered 2014-02-22 – 2014-02-23 (×2): via INTRAVENOUS

## 2014-02-22 MED ORDER — DEXAMETHASONE SODIUM PHOSPHATE 10 MG/ML IJ SOLN
INTRAMUSCULAR | Status: DC | PRN
  Administered 2014-02-22: 5 mg via INTRAVENOUS

## 2014-02-22 MED ORDER — MIDAZOLAM HCL 5 MG/5ML IJ SOLN
INTRAMUSCULAR | Status: DC | PRN
  Administered 2014-02-22: 2 mg via INTRAVENOUS

## 2014-02-22 MED ORDER — ACETAMINOPHEN 10 MG/ML IV SOLN
1000.0000 mg | Freq: Once | INTRAVENOUS | Status: AC
  Administered 2014-02-22: 1000 mg via INTRAVENOUS
  Filled 2014-02-22: qty 100

## 2014-02-22 MED ORDER — HYDRALAZINE HCL 50 MG PO TABS
50.0000 mg | ORAL_TABLET | Freq: Three times a day (TID) | ORAL | Status: DC
  Administered 2014-02-22 – 2014-02-24 (×6): 50 mg via ORAL
  Filled 2014-02-22 (×7): qty 1

## 2014-02-22 MED ORDER — HYDROMORPHONE HCL 1 MG/ML IJ SOLN
0.5000 mg | INTRAMUSCULAR | Status: DC | PRN
  Administered 2014-02-22 – 2014-02-23 (×3): 1 mg via INTRAVENOUS
  Filled 2014-02-22 (×3): qty 1

## 2014-02-22 MED ORDER — CARVEDILOL 12.5 MG PO TABS
12.5000 mg | ORAL_TABLET | Freq: Two times a day (BID) | ORAL | Status: DC
  Administered 2014-02-22 – 2014-02-24 (×4): 12.5 mg via ORAL
  Filled 2014-02-22 (×3): qty 1
  Filled 2014-02-22: qty 2

## 2014-02-22 MED ORDER — MIDAZOLAM HCL 2 MG/2ML IJ SOLN
INTRAMUSCULAR | Status: AC
  Filled 2014-02-22: qty 2

## 2014-02-22 MED ORDER — LISINOPRIL 20 MG PO TABS
20.0000 mg | ORAL_TABLET | Freq: Two times a day (BID) | ORAL | Status: DC
  Administered 2014-02-22 – 2014-02-24 (×4): 20 mg via ORAL
  Filled 2014-02-22 (×4): qty 1

## 2014-02-22 MED ORDER — ONDANSETRON HCL 4 MG/2ML IJ SOLN
INTRAMUSCULAR | Status: AC
  Filled 2014-02-22: qty 2

## 2014-02-22 MED ORDER — GLYCOPYRROLATE 0.2 MG/ML IJ SOLN
INTRAMUSCULAR | Status: DC | PRN
  Administered 2014-02-22: 0.6 mg via INTRAVENOUS
  Administered 2014-02-22 (×2): 0.2 mg via INTRAVENOUS

## 2014-02-22 MED ORDER — STERILE WATER FOR IRRIGATION IR SOLN
Status: DC | PRN
  Administered 2014-02-22: 3000 mL

## 2014-02-22 SURGICAL SUPPLY — 56 items
CABLE HIGH FREQUENCY MONO STRZ (ELECTRODE) ×3 IMPLANT
CATH FOLEY 2WAY SLVR 18FR 30CC (CATHETERS) ×3 IMPLANT
CATH TIEMANN FOLEY 18FR 5CC (CATHETERS) ×3 IMPLANT
CHLORAPREP W/TINT 26ML (MISCELLANEOUS) ×3 IMPLANT
CLIP LIGATING HEM O LOK PURPLE (MISCELLANEOUS) ×7 IMPLANT
CLOTH BEACON ORANGE TIMEOUT ST (SAFETY) ×3 IMPLANT
CONT SPEC 4OZ CLIKSEAL STRL BL (MISCELLANEOUS) ×3 IMPLANT
COVER SURGICAL LIGHT HANDLE (MISCELLANEOUS) ×3 IMPLANT
COVER TIP SHEARS 8 DVNC (MISCELLANEOUS) ×2 IMPLANT
COVER TIP SHEARS 8MM DA VINCI (MISCELLANEOUS) ×1
CUTTER ECHEON FLEX ENDO 45 340 (ENDOMECHANICALS) ×3 IMPLANT
DECANTER SPIKE VIAL GLASS SM (MISCELLANEOUS) ×2 IMPLANT
DRSG TEGADERM 4X4.75 (GAUZE/BANDAGES/DRESSINGS) ×6 IMPLANT
DRSG TEGADERM 6X8 (GAUZE/BANDAGES/DRESSINGS) ×4 IMPLANT
ELECT REM PT RETURN 9FT ADLT (ELECTROSURGICAL) ×3
ELECTRODE REM PT RTRN 9FT ADLT (ELECTROSURGICAL) ×2 IMPLANT
GAUZE SPONGE 2X2 8PLY STRL LF (GAUZE/BANDAGES/DRESSINGS) ×2 IMPLANT
GLOVE BIO SURGEON STRL SZ 6.5 (GLOVE) ×3 IMPLANT
GLOVE BIOGEL M STRL SZ7.5 (GLOVE) ×7 IMPLANT
GLOVE BIOGEL PI IND STRL 7.5 (GLOVE) ×2 IMPLANT
GLOVE BIOGEL PI INDICATOR 7.5 (GLOVE) ×1
GOWN STRL REUS W/TWL LRG LVL4 (GOWN DISPOSABLE) ×10 IMPLANT
HOLDER FOLEY CATH W/STRAP (MISCELLANEOUS) ×3 IMPLANT
IV LACTATED RINGERS 1000ML (IV SOLUTION) ×3 IMPLANT
KIT ACCESSORY DA VINCI DISP (KITS) ×1
KIT ACCESSORY DVNC DISP (KITS) ×2 IMPLANT
KIT PROCEDURE DA VINCI SI (MISCELLANEOUS) ×1
KIT PROCEDURE DVNC SI (MISCELLANEOUS) ×2 IMPLANT
LIQUID BAND (GAUZE/BANDAGES/DRESSINGS) ×3 IMPLANT
NDL INSUFFLATION 14GA 120MM (NEEDLE) ×2 IMPLANT
NEEDLE INSUFFLATION 14GA 120MM (NEEDLE) ×3 IMPLANT
PACK ROBOT UROLOGY CUSTOM (CUSTOM PROCEDURE TRAY) ×3 IMPLANT
PAD POSITIONING PINK XL (MISCELLANEOUS) ×1 IMPLANT
PEN SKIN MARKING BROAD (MISCELLANEOUS) ×1 IMPLANT
PORT ACCESS TROCAR AIRSEAL 12 (TROCAR) IMPLANT
PORT ACCESS TROCAR AIRSEAL 5M (TROCAR) ×1
RELOAD GREEN ECHELON 45 (STAPLE) ×3 IMPLANT
SET TRI-LUMEN FLTR TB AIRSEAL (TUBING) ×1 IMPLANT
SET TUBE IRRIG SUCTION NO TIP (IRRIGATION / IRRIGATOR) ×3 IMPLANT
SOLUTION ELECTROLUBE (MISCELLANEOUS) ×3 IMPLANT
SPONGE GAUZE 2X2 STER 10/PKG (GAUZE/BANDAGES/DRESSINGS) ×1
SPONGE LAP 4X18 X RAY DECT (DISPOSABLE) ×3 IMPLANT
SUT ETHILON 3 0 PS 1 (SUTURE) ×4 IMPLANT
SUT MNCRL AB 4-0 PS2 18 (SUTURE) ×6 IMPLANT
SUT PDS AB 1 CT1 27 (SUTURE) ×6 IMPLANT
SUT PROLENE 0 CT 1 CR/8 (SUTURE) ×1 IMPLANT
SUT VIC AB 2-0 SH 27 (SUTURE) ×3
SUT VIC AB 2-0 SH 27X BRD (SUTURE) ×2 IMPLANT
SUT VICRYL 0 UR6 27IN ABS (SUTURE) ×3 IMPLANT
SUT VLOC BARB 180 ABS3/0GR12 (SUTURE) ×9
SUTURE VLOC BRB 180 ABS3/0GR12 (SUTURE) ×6 IMPLANT
SYR 27GX1/2 1ML LL SAFETY (SYRINGE) ×3 IMPLANT
TOWEL OR 17X26 10 PK STRL BLUE (TOWEL DISPOSABLE) ×3 IMPLANT
TOWEL OR NON WOVEN STRL DISP B (DISPOSABLE) ×3 IMPLANT
TROCAR 12M 150ML BLUNT (TROCAR) ×3 IMPLANT
WATER STERILE IRR 1500ML POUR (IV SOLUTION) ×6 IMPLANT

## 2014-02-22 NOTE — Transfer of Care (Signed)
Immediate Anesthesia Transfer of Care Note  Patient: [name]  Procedure(s) Performed: Procedure(s) (LRB): ROBOTIC ASSISTED LAPAROSCOPIC RADICAL PROSTATECTOMY WITH INDOCYANINE GREEN DYE AND OPEN UMBILICAL HERNIA REPAIR (N/A) PELVIC LYMPH NODE DISSECTION (Bilateral)  Patient Location: PACU  Anesthesia Type: General  Level of Consciousness: sedated, patient cooperative and responds to stimulation  Airway & Oxygen Therapy: Patient Spontanous Breathing and Patient connected to face mask oxgen  Post-op Assessment: Report given to PACU RN and Post -op Vital signs reviewed and stable  Post vital signs: Reviewed and stable  Complications: No apparent anesthesia complications

## 2014-02-22 NOTE — Brief Op Note (Signed)
02/22/2014  4:01 PM  PATIENT:  [name]  59 y.o. male  PRE-OPERATIVE DIAGNOSIS:  HIGH RISK PROSTATE CANCER, SMALL UMBILICAL HERNIA  POST-OPERATIVE DIAGNOSIS:  HIGH RISK PROSTATE CANCER, SMALL UMBILICAL HERNIA  PROCEDURE:  Procedure(s): ROBOTIC ASSISTED LAPAROSCOPIC RADICAL PROSTATECTOMY WITH INDOCYANINE GREEN DYE AND OPEN UMBILICAL HERNIA REPAIR (N/A) PELVIC LYMPH NODE DISSECTION (Bilateral)  SURGEON:  Surgeon(s) and Role:    * [name], MD - Primary  PHYSICIAN ASSISTANT:   ASSISTANTS: Clemetine Marker, PA   ANESTHESIA:   general  EBL:  Total I/O In: 1000 [I.V.:1000] Out: 200 [Urine:150; Blood:50]  BLOOD ADMINISTERED:none  DRAINS: 1 - JP to bulb suction; 2 - Foley to straight drain   LOCAL MEDICATIONS USED:  MARCAINE     SPECIMEN:  Source of Specimen:  1 - bilateral pelvic lymph nodes; 2 - bladder neck margin; 3 - radical prostatectomy  DISPOSITION OF SPECIMEN:  PATHOLOGY  COUNTS:  YES  TOURNIQUET:  * No tourniquets in log *  DICTATION: .Other Dictation: Dictation Number  [id number]  PLAN OF CARE: Admit to inpatient   PATIENT DISPOSITION:  PACU - hemodynamically stable.   Delay start of Pharmacological VTE agent (>24hrs) due to surgical blood loss or risk of bleeding: yes

## 2014-02-22 NOTE — Discharge Instructions (Signed)

## 2014-02-22 NOTE — H&P (Signed)
[name] is an 59 y.o. male.    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 Janice Norrie on eval PSA 14.8. TRUS 51gm with small median lobe. Pelvic MRI w/o adenopathy and perhaps very small extracapsular extention Rt apical. Bone Scan negative.  2 - Small Umbilical Hernia - nickle sized fascial defect at umbilicus, no h/o strangulation or pain at site.   PMH sig for arrythmia / ablation (no baseline limitations or blood thinners, follows [facility]), lap chole, DM2 (oral meds only, no neuropathy). His PCP is Gwendolyn Grant MD with [facility].   Today "[name]" is seen to proceed with robotic prostatectomy and umbilical hernia repair.   Past Medical History  Diagnosis Date  . CAD     nonobst cath 2004  . CARDIOMYOPATHY     NICM, LVEF 30% 01/2011 echo; 20-25% 07/2012 echo  . DIABETES MELLITUS, TYPE II   . HYPERCHOLESTEROLEMIA   . HYPERTENSION   . PSA, INCREASED   . Noncompliance   . CHF (congestive heart failure)   . PVCs (premature ventricular contractions)     s/p ablation 01-07-2013 by Dr Rayann Heman  . Asthma     years ago   . Arthritis     lower back   . Cancer     prostate cancer    Past Surgical History  Procedure Laterality Date  . Cardiac catheterization  2004    nonobst dz  . Surgery scrotal / testicular      at age 31  . Ablation  01-07-2013    RVOT PVC's ablated by Dr Rayann Heman along anteroseptal RVOT  . Cholecystectomy N/A 11/16/2013    Procedure: LAPAROSCOPIC CHOLECYSTECTOMY WITH INTRAOPERATIVE CHOLANGIOGRAM;  Surgeon: [name], MD;  Location: [facility] ORS;  Service: General;  Laterality: N/A;  . Supraventricular tachycardia ablation N/A 09/28/2012    Procedure: Tanna Furry Ablation;  Surgeon: [name], MD;  Location: [facility] CATH LAB;  Service: Cardiovascular;  Laterality: N/A;  . V-tach ablation N/A 01/07/2013     Procedure: V-TACH ABLATION;  Surgeon: [name], MD;  Location: [facility] CATH LAB;  Service: Cardiovascular;  Laterality: N/A;    Family History  Problem Relation Age of Onset  . Peripheral vascular disease    . Hypertension    . Prostate cancer    . Hypertension Mother   . Peripheral vascular disease Mother   . Prostate cancer Brother    Social History:  reports that he has never smoked. He has never used smokeless tobacco. He reports that he does not drink alcohol or use illicit drugs.  Allergies:  Allergies  Allergen Reactions  . Ambien [Zolpidem Tartrate] Other (See Comments)    "Went berserk."    No prescriptions prior to admission    No results found for this or any previous visit (from the past 48 hour(s)). No results found.  Review of Systems  Constitutional: Negative.  Negative for fever and chills.  HENT: Negative.   Eyes: Negative.   Respiratory: Negative.   Cardiovascular: Negative.   Gastrointestinal: Negative.   Genitourinary: Negative.   Musculoskeletal: Negative.   Skin: Negative.   Neurological: Negative.   Endo/Heme/Allergies: Negative.   Psychiatric/Behavioral: Negative.     There were no vitals taken for this visit. Physical Exam  Constitutional: He appears well-developed.  HENT:  Head: Normocephalic.  Eyes: Pupils are equal, round,  and reactive to light.  Neck: Normal range of motion.  Cardiovascular: Normal rate.   Respiratory: Effort normal.  GI: Soft.  Small reducible umbilical hernia  Genitourinary: Penis normal.  Musculoskeletal: Normal range of motion.  Neurological: He is alert.  Skin: Skin is warm.  Psychiatric: He has a normal mood and affect. His behavior is normal. Judgment and thought content normal.     Assessment/Plan  1 - High Risk Prostate Cancer -  clinically localized, possible early T3 aggressive disease. He has excellent functional status and wants aggressive treatment. He has been extensively counseled on options  and their relative merits and wants to proceed with radical prostatectomy. Sexual function not large priority and we did discuss plan for purposefully wide resection given high risk disease.  We rediscussed prostatectomy and specifically robotic prostatectomy with bilateral pelvic lymphadenectomy being the technique that I most commonly perform. I showed the patient on their abdomen the approximately 6 small incision (trocar) sites as well as presumed extraction sites with robotic approach as well as possible open incision sites should open conversion be necessary. We rediscussed peri-operative risks including bleeding, infection, deep vein thrombosis, pulmonary embolism, compartment syndrome, nuropathy / neuropraxia, heart attack, stroke, death, as well as long-term risks such as non-cure / need for additional therapy. We specificallyre addressed that the procedure would compromise urinary control leading to stress incontinence which typically resolves with time and pelvic rehabilitation (Kegel's, etc..), but can sometimes be permanent and require additional therapy including surgery. We also specificallyre addressed sexual sequellae including significant erectile dysfunction which typically partially resolves with time but can also be permanent and require additional therapy including surgery.   We rediscussed the typical hospital course including usual 1-2 night hospitalization, discharge with foley catheter in place usually for 1-2 weeks before voiding trial as well as usually 2 week recovery until able to perform most non-strenuous activity and 6 weeks until able to return to most jobs and more strenuous activity such as exercise.   He voiced understanding and desire to proceed today as planned.   2 - Small Umbilical Hernia -  Will repair open by incorporating in to extraction site with prolene closure. Additional risks such as hernia recurrence discussed.  Isabella Ida 02/22/2014, 6:03 AM

## 2014-02-22 NOTE — Anesthesia Preprocedure Evaluation (Signed)
Anesthesia Evaluation  Patient identified by MRN, date of birth, ID band Patient awake    Reviewed: Allergy & Precautions, H&P , NPO status , Patient's Chart, lab work & pertinent test results  History of Anesthesia Complications Negative for: history of anesthetic complications  Airway Mallampati: III  TM Distance: >3 FB Neck ROM: Full    Dental  (+) Poor Dentition, Missing, Dental Advisory Given   Pulmonary asthma , sleep apnea ,  breath sounds clear to auscultation  Pulmonary exam normal       Cardiovascular Exercise Tolerance: Good hypertension, Pt. on home beta blockers and Pt. on medications + CAD and +CHF Rhythm:Regular Rate:Normal  Hx of nonischemic cardiomyopathy, last ECHO 2/15 with improved EF of 40-45%, denies any worsening of symptoms, denies chest pain with activity. Reports METS >4, denies edema, PND, DOE. Non-obstructive CAD 2004 cath. PVCs   Neuro/Psych negative neurological ROS  negative psych ROS   GI/Hepatic negative GI ROS, Neg liver ROS,   Endo/Other  diabetes, Type 2, Oral Hypoglycemic Agents  Renal/GU negative Renal ROSCRT 1.38  negative genitourinary   Musculoskeletal negative musculoskeletal ROS (+)   Abdominal   Peds negative pediatric ROS (+)  Hematology negative hematology ROS (+)   Anesthesia Other Findings   Reproductive/Obstetrics negative OB ROS                             Anesthesia Physical Anesthesia Plan  ASA: III  Anesthesia Plan: General   Post-op Pain Management:    Induction: Intravenous  Airway Management Planned: Oral ETT  Additional Equipment:   Intra-op Plan:   Post-operative Plan: Extubation in OR  Informed Consent:   Plan Discussed with: Surgeon  Anesthesia Plan Comments:         Anesthesia Quick Evaluation

## 2014-02-22 NOTE — Anesthesia Procedure Notes (Signed)
Procedure Name: Intubation Date/Time: 02/22/2014 12:59 PM Performed by: Montel Clock Pre-anesthesia Checklist: Patient identified, Emergency Drugs available, Suction available, Patient being monitored and Timeout performed Patient Re-evaluated:Patient Re-evaluated prior to inductionOxygen Delivery Method: Circle system utilized Preoxygenation: Pre-oxygenation with 100% oxygen Intubation Type: IV induction Ventilation: Mask ventilation without difficulty Laryngoscope Size: Mac and 3 Grade View: Grade I Tube type: Oral Tube size: 7.5 mm Number of attempts: 1 Airway Equipment and Method: Stylet Placement Confirmation: ETT inserted through vocal cords under direct vision,  positive ETCO2 and breath sounds checked- equal and bilateral Secured at: 22 cm Tube secured with: Tape Dental Injury: Teeth and Oropharynx as per pre-operative assessment  Comments: Front upper teeth loose preoperatively. Intact after intubation.

## 2014-02-23 ENCOUNTER — Encounter (HOSPITAL_COMMUNITY): Payer: Self-pay | Admitting: Urology

## 2014-02-23 LAB — BASIC METABOLIC PANEL
Anion gap: 6 (ref 5–15)
BUN: 18 mg/dL (ref 6–23)
CO2: 24 mmol/L (ref 19–32)
Calcium: 8.3 mg/dL — ABNORMAL LOW (ref 8.4–10.5)
Chloride: 106 mmol/L (ref 96–112)
Creatinine, Ser: 1.28 mg/dL (ref 0.50–1.35)
GFR calc Af Amer: 69 mL/min — ABNORMAL LOW (ref >90)
GFR calc non Af Amer: 60 mL/min — ABNORMAL LOW (ref >90)
Glucose, Bld: 181 mg/dL — ABNORMAL HIGH (ref 70–99)
Potassium: 4.3 mmol/L (ref 3.5–5.1)
Sodium: 136 mmol/L (ref 135–145)

## 2014-02-23 LAB — GLUCOSE, CAPILLARY
Glucose-Capillary: 86 mg/dL (ref 70–99)
Glucose-Capillary: 135 mg/dL — ABNORMAL HIGH (ref 70–99)
Glucose-Capillary: 105 mg/dL — ABNORMAL HIGH (ref 70–99)
Glucose-Capillary: 118 mg/dL — ABNORMAL HIGH (ref 70–99)
Glucose-Capillary: 144 mg/dL — ABNORMAL HIGH (ref 70–99)

## 2014-02-23 LAB — HEMOGLOBIN AND HEMATOCRIT, BLOOD
HCT: 37.3 % — ABNORMAL LOW (ref 39.0–52.0)
Hemoglobin: 12 g/dL — ABNORMAL LOW (ref 13.0–17.0)

## 2014-02-23 MED ORDER — ACETAMINOPHEN 500 MG PO TABS
1000.0000 mg | ORAL_TABLET | Freq: Three times a day (TID) | ORAL | Status: AC
  Administered 2014-02-23 – 2014-02-24 (×3): 1000 mg via ORAL
  Filled 2014-02-23 (×2): qty 2

## 2014-02-23 MED ORDER — SODIUM CHLORIDE 0.9 % IV SOLN
INTRAVENOUS | Status: DC
  Administered 2014-02-23: 15:00:00 via INTRAVENOUS

## 2014-02-23 MED ORDER — KCL IN DEXTROSE-NACL 20-5-0.45 MEQ/L-%-% IV SOLN: INTRAVENOUS | Status: DC

## 2014-02-23 NOTE — Care Management Note (Signed)
    Page 1 of 1   02/23/2014     11:57:27 AM CARE MANAGEMENT NOTE 02/23/2014  Patient:  [name], [name]   Account Number:  [id number]  Date Initiated:  02/23/2014  Documentation initiated by:  Dessa Phi  Subjective/Objective Assessment:   59 y/o m admitted w/Prostate Ca.     Action/Plan:   From home.   Anticipated DC Date:  02/24/2014   Anticipated DC Plan:  [place]  CM consult      Choice offered to / List presented to:             Status of service:  In process, will continue to follow Medicare Important Message given?   (If response is "NO", the following Medicare IM given date fields will be blank) Date Medicare IM given:   Medicare IM given by:   Date Additional Medicare IM given:   Additional Medicare IM given by:    Discharge Disposition:    Per UR Regulation:  Reviewed for med. necessity/level of care/duration of stay  If discussed at [place] of Stay Meetings, dates discussed:    Comments:  02/23/14 Dessa Phi RN BSN NCM [phone] s/p Lap Rad Prostatectomy.No anticipated d/c needs.

## 2014-02-23 NOTE — Progress Notes (Signed)
1 Day Post-Op  Subjective:  1 - High Risk Prostate Cancer - s/p robotic prostatectomy with bilateral template + sentinal ICG pelvic lymphadenectomy on 1/28. Concomitant umbilical hernia repair as well.   2 - Small Umbilical Hernia - s/p umbilical hernia repair at time of prostatectomy above.   Today "[name]" c/o abd soreness, but overall satisfied. Ambulating, tollerating PO.   Objective: Vital signs in last 24 hours: Temp:  [97.8 F (36.6 C)-98.5 F (36.9 C)] 98.5 F (36.9 C) (01/28 0536) Pulse Rate:  [69-91] 84 (01/28 0536) Resp:  [13-20] 16 (01/28 0536) BP: (144-180)/(72-92) 154/87 mmHg (01/28 0958) SpO2:  [98 %-100 %] 98 % (01/28 0536) Last BM Date: 02/22/14  Intake/Output from previous day: 01/27 0701 - 01/28 0700 In: 5115 [P.O.:240; I.V.:3875; IV Piggyback:1000] Out: 2025 [Urine:1775; Drains:200; Blood:50] Intake/Output this shift:    General appearance: alert, cooperative and appears stated age Head: Normocephalic, without obvious abnormality, atraumatic Eyes: negative Nose: Nares normal. Septum midline. Mucosa normal. No drainage or sinus tenderness. Throat: lips, mucosa, and tongue normal; teeth and gums normal Neck: supple, symmetrical, trachea midline Back: symmetric, no curvature. ROM normal. No CVA tenderness. Resp: non-labored on room air Chest wall: no tenderness Cardio: Nl rate GI: soft, non-tender; bowel sounds normal; no masses,  no organomegaly Male genitalia: normal, foley c/d/i with very light pink urine.  Extremities: extremities normal, atraumatic, no cyanosis or edema Pulses: 2+ and symmetric Skin: Skin color, texture, turgor normal. No rashes or lesions Lymph nodes: Cervical, supraclavicular, and axillary nodes normal. Neurologic: Grossly normal Incision/Wound: recent port sites and extraction / umbilical hernia repair site c/d/i. JP with scant serosanguinous output.   Lab Results:   Recent Labs  02/22/14 1605 02/23/14 0440  HGB 11.6*  12.0*  HCT 35.7* 37.3*   BMET  Recent Labs  02/23/14 0440  NA 136  K 4.3  CL 106  CO2 24  GLUCOSE 181*  BUN 18  CREATININE 1.28  CALCIUM 8.3*   PT/INR No results for input(s): LABPROT, INR in the last 72 hours. ABG No results for input(s): PHART, HCO3 in the last 72 hours.  Invalid input(s): PCO2, PO2  Studies/Results: No results found.  Anti-infectives: Anti-infectives    Start     Dose/Rate Route Frequency Ordered Stop   02/22/14 1059  ceFAZolin (ANCEF) IVPB 2 g/50 mL premix     2 g100 mL/hr over 30 Minutes Intravenous 30 min pre-op 02/22/14 1059 02/22/14 1305   02/22/14 0000  ciprofloxacin (CIPRO) 500 MG tablet     500 mg Oral 2 times daily 02/22/14 1607        Assessment/Plan:  1 - High Risk Prostate Cancer - doing well POD 1. Still having some issues with pain control, but improving. Remain in house tonight. Hgb stable.   2 - Small Umbilical Hernia - no recurrence thus far.  3 - Likely DC tomorrow AM.   Tresa Moore, Tyniesha Howald 02/23/2014

## 2014-02-23 NOTE — Anesthesia Postprocedure Evaluation (Signed)
  Anesthesia Post-op Note  Patient: [name]  Procedure(s) Performed: Procedure(s) (LRB): ROBOTIC ASSISTED LAPAROSCOPIC RADICAL PROSTATECTOMY WITH INDOCYANINE GREEN DYE AND OPEN UMBILICAL HERNIA REPAIR (N/A) PELVIC LYMPH NODE DISSECTION (Bilateral)  Patient Location: PACU  Anesthesia Type: General  Level of Consciousness: awake and alert   Airway and Oxygen Therapy: Patient Spontanous Breathing  Post-op Pain: mild  Post-op Assessment: Post-op Vital signs reviewed, Patient's Cardiovascular Status Stable, Respiratory Function Stable, Patent Airway and No signs of Nausea or vomiting  Last Vitals:  Filed Vitals:   02/23/14 0958  BP: 154/87  Pulse:   Temp:   Resp:     Post-op Vital Signs: stable   Complications: No apparent anesthesia complications

## 2014-02-23 NOTE — Op Note (Signed)
[name], [name] NO.:  [id number]  MEDICAL RECORD NO.:  [id number]  LOCATION:  5400                         FACILITY:  [facility]  PHYSICIAN:  [name], MD     DATE OF BIRTH:  01/23/1956  DATE OF PROCEDURE:  02/22/2014 DATE OF DISCHARGE:                              OPERATIVE REPORT   DIAGNOSES: 1. High-risk prostate cancer. 2. Umbilical hernia.  PROCEDURE: 1. Robotic-assisted laparoscopic radical prostatectomy. 2. Bilateral pelvic lymphadenectomy with injection of indocyanine     green dye. 3. Open umbilical hernia repair.  ASSISTANT:  Betha Loa, PA.  ESTIMATED BLOOD LOSS:  100 mL.  DRAINS: 1. Jackson-Pratt drain to bulb suction. 2. Foley catheter to straight drain.  FINDINGS: 1. Small umbilical hernia fascial defect approximately 1.5 cm. 2. No obvious fluorescence sentinel lymph nodes within the external     iliac obturator, internal iliacs, common iliac, and periaortic     lymph node groups. 3. Several lymphatic channel seen coursing over the anterolateral     surface of the bladder with one possible sentinel lymph node.  This     was set aside for permanent pathology. 4. Nodular hypertrophy of the posterior bladder neck.  Frozen section     negative for carcinoma.  SPECIMENS: 1. Right external iliac lymph nodes. 2. Right obturator lymph nodes. 3. Left external iliac lymph nodes. 4. Left obturator lymph nodes. 5. Periprostatic fat. 6. Perivesical sentinel lymph node. 7. Radical prostatectomy.  INDICATION:  [name] is a 59 year old gentleman with history of elevated PSA.  He was found on workup of this to have multifocal high- risk prostate cancer with 12/12 cores positive.  He underwent staging imaging, bone scan, and pelvic MRI, which revealed no obvious metastases for locally advanced disease.  Options were discussed for definitive management including surveillance protocols versus ablative therapies versus surgical  extirpation with and without minimally invasive assistance and he adamantly wished to proceed with robotic prostatectomy.  He had been extensively counseled that this may be the first in a multimodal approach given his high-risk disease.  Informed consent was obtained and placed in the medical record.  PROCEDURE IN DETAIL:  The patient being [name] verified. Procedure being robotic prostatectomy with umbilical hernia repair was confirmed.  Procedure was carried out.  Time-out was performed. Intravenous antibiotics were administered.  General endotracheal anesthesia was introduced.  The patient was placed into a modified low lithotomy position.  His arms were tucked.  Sequential pressure devices were applied.  Sterile field was created by prepping and draping the patient's infra-xiphoid abdomen using chlorhexidine gluconate and his penis, perineum, proximal thighs using iodine x3.  He was placed into steep Trendelenburg position and found to be suitably positioned.  Foley catheter was placed per urethra to straight drain.  Next, a high-flow low pressure pneumoperitoneum was obtained using Veress technique in the infraumbilical midline having passed the aspiration and drop test.  A 12 mm robotic camera port was placed in this location.  Laparoscopic examination of the peritoneal cavity revealed no significant adhesions and no visceral injury.  Notably, the camera port was placed at the inferior most border of the patient's umbilical hernia.  Additional ports were  placed as follows: 1. Right paramedian 8-mm robotic port. 2. Right far lateral 12 mm AirSeal assistant port. 3. 5 mm paramedian suction port. 4. Left 8 mm robotic port. 5. Left far lateral 8-mm robotic port.  Robot was docked and passed     through electronic checks. Initial attention was directed at development of space of Retzius. Incision was made lateral to the left medial umbilical ligament from the midline  towards the area of the internal ring and coursing along the area of the external iliac vessels.  The left lateral bladder was carefully swept away from the pelvic sidewall towards the area of the endopelvic fascia.  The vas deferens was encountered and ligated it using a bucket handle for medial traction.  A mirror image dissection was performed on the right side to bring the right bladder wall towards pelvic sidewall and purposely ligating the right vas deferens to use as a bucket handle.  Anterior dissection was accomplished using cautery scissors.  This exposed the anterior base of the prostate, which was defatted with this already set aside and labeled the periprostatic fat. Given the high-risk disease in the patient, lymphangiography was performed.  An 18-gauge spinal needle was brought through the anterior abdomen wall above the pubic symphysis and using robotic guidance, 0.2 mL of indocyanine green dye was injected into the left and right lobes of the prostate respectively.  It was entered, immediate suctioning and coagulation current to prevent spillage, which did not occur.  Next, the endopelvic fascia was carefully explored from the lateral aspect of the prostate in base to apex orientation exposing the dorsal venous complexes, which was controlled using vascular load stapler taking great care to verify no numbness or urethral injury, this did not occur. Within approximately 15 minutes post dye injection, sentinel lymphangiography was performed.  Inspection of the pelvis under near infrared fluorescence revealed no obvious fluorescent lymph nodes with a traditional pelvic packets. There were some lymphatic channels coursing over the anterolateral aspect of the bladder probably on the right side, representative area presumed right prevesical lymph node was dissected free and set aside, labeled as right perivesicular lymph node, sentinel.  Next, standard template  lymphadenectomy was performed first on the right side of the external iliac group with the confines being the external iliac artery, vein, pelvic side wall, iliac bifurcation.  Hemostasis was achieved with cold clips.  This was set aside, labeled right external iliac lymph nodes.  Next, right obturator group was dissected free with confines dissection being obturator nerve, pelvic side wall, external iliac vein.  Great care was taken to avoid any rectal injury to obturator nerve, which did not occur.  It was grossly uninjured.  Following this dissection, a mirror-imaged lymphadenectomy was performed on the left side, again the left external iliac and left obturator lymph node groups respectively. Left obturator nerve was also inspected and found to be uninjured. Next, the bladder was identified moving the Foley catheter back and forth and bladder dissection was performed in the anterior plane separating the base of the prostate away from the bladder keeping what appeared to be a rim of fibromuscular tissue on the side of the specimen and taking great care to avoid excessive bladder neck caliber.  In the posterior aspect of the dissection, there was significant nodular tissue concerning for possible prostate tissue in this portion versus TBA changes to the bladder neck portion, this was set aside for frozen section and found to be negative for carcinoma.  Posterior dissection  was performed by entering the plane of Denonvilliers identifying the vas deferens, which were dissected for a distance of 4 cm, ligated, and placed on gentle superior traction.  Bilateral seminal vesicles were dissected to the tip and also placed under gentle superior traction. These were quite large.  The posterior plane was further developed in base to apex orientation and the plane of Denonvilliers, which exposed the neurovascular pedicles bilaterally.  Given the patient's high risk disease, purposeful wide  dissection was performed of the pedicles using sequential Hem-O-Lock clipping resulted in excellent hemostatic control of the left and right neurovascular pedicles.  Final apical dissection was performed in the anterior plane by placing the prostate in gentle superior traction and cold transfixing the membranous urethra.  This completely freed up the radical prostatectomy.  Specimen was placed into an EndoCatch bag for later retrieval.  Digital rectal exam was performed under laparoscopic guidance using indicator and No evidence of rectal violation was seen.  Next, posterior reconstruction was performed using a V-Loc suture reapproximating the posterior urethral plate, the posterior perivesical tissue, which brought the structures in a tension- free apposition.  Revision of the posterior bladder neck was further performed dissecting additional nodular tissue away from the area of anastomosis, this was set aside and labeled final posterior bladder neck margin.  Under laparoscopic vision, it appeared grossly that this likely represented bladder hypertrophy.  Bladder neck, urethral mucosa, mucosa anastomosis was performed using double-armed suture from the 6 o'clock to 12 o'clock position.  New Foley catheter was then easily placed per urethra, which were irrigated quantitatively.  The anastomotic stitch was anchored to the puboprostatic ligaments thus performing anterior reconstruction as well. At this point, all sponge and needle counts were correct.  There was excellent hemostasis.  Closed suction drain was brought through the previous left lateral most robotic port site to the area the peritoneal cavity.  Drain stitch was applied.  The previous right 12 mm assistant port site was closed to the level of fascia using a Carter-Thomason suture passer under laparoscopic vision.  Robot was then undocked. Specimen was retrieved by extending the previous camera port site inferiorly and  superiorly for a total distance of approximately 4 cm removing the prostatectomy specimen, which was set aside for permanent pathology.  The edges of the fascia via the extraction site were carefully separated in a component separation type fashion, which identified the true fascia, there was as expected an approximately nickel-sized hernia defect, open hernia repair was then performed by first sweeping the omentum over the extraction site and then performing open hernia repair with a figure-of-eight Prolene x6, which resulted in complete resolution of the previous fascial defect.  The Sabino Donovan was reapproximated using running Vicryl.  All incision sites were infiltrated with dilute lipolyzed Marcaine and closed to the level of skin using subcuticular Monocryl followed by Dermabond.  The procedure was then terminated.  The patient tolerated the procedure well with no immediate periprocedural complications.  The patient was taken to the postanesthesia care unit in a stable condition.  An additional indication, we had discussed the patient's small umbilical hernia, which was identified on preoperative exam.  Management of this including nonaggressive versus umbilical hernia repair in open fashion incorporating the extraction site, and he wished to proceed with this. This was also mentioned in informed consent.          ______________________________ [name], MD     TM/MEDQ  D:  02/22/2014  T:  02/23/2014  Job:  [id number] 

## 2014-02-24 LAB — GLUCOSE, CAPILLARY
Glucose-Capillary: 124 mg/dL — ABNORMAL HIGH (ref 70–99)
Glucose-Capillary: 91 mg/dL (ref 70–99)

## 2014-02-24 MED ORDER — SULFAMETHOXAZOLE-TRIMETHOPRIM 800-160 MG PO TABS: 1.0000 | ORAL_TABLET | Freq: Two times a day (BID) | ORAL | Status: DC

## 2014-02-24 MED ORDER — SENNOSIDES-DOCUSATE SODIUM 8.6-50 MG PO TABS: 1.0000 | ORAL_TABLET | Freq: Two times a day (BID) | ORAL | Status: DC

## 2014-02-24 NOTE — Progress Notes (Signed)
CARE MANAGEMENT NOTE 02/24/2014  Patient:  [name], [name]   Account Number:  [id number]  Date Initiated:  02/23/2014  Documentation initiated by:  Dessa Phi  Subjective/Objective Assessment:   59 y/o m admitted w/Prostate Ca.     Action/Plan:   From home.   Anticipated DC Date:  02/24/2014   Anticipated DC Plan:  [place]  CM consult      Choice offered to / List presented to:             Status of service:  Completed, signed off Medicare Important Message given?   (If response is "NO", the following Medicare IM given date fields will be blank) Date Medicare IM given:   Medicare IM given by:   Date Additional Medicare IM given:   Additional Medicare IM given by:    Discharge Disposition:  HOME/SELF CARE  Per UR Regulation:  Reviewed for med. necessity/level of care/duration of stay  If discussed at [place] of Stay Meetings, dates discussed:    Comments:  02/23/14 Dessa Phi RN BSN NCM [phone] s/p Lap Rad Prostatectomy.No anticipated d/c needs.

## 2014-02-24 NOTE — Discharge Summary (Addendum)
Physician Discharge Summary  Patient ID: [name] MRN: [id number] DOB/AGE: 60/60/1957 60 y.o.  Admit date: 02/22/2014 Discharge date: 02/24/2014  Admission Diagnoses: Prostate Cancer, Umbilical hernia  Discharge Diagnoses:  Active Problems:   Prostate cancer Umbilical Hernia  Discharged Condition: good  Hospital Course:   1 - High Risk Prostate Cancer - s/p robotic prostatectomy with bilateral template + sentinal ICG pelvic lymphadenectomy on 1/28. Concomitant umbilical hernia repair as well. Path pending at discharge. By POD 2, the day of discharge, he is ambulatory, tollerating regular diet, JP removed as output scant, and felt to be adequate for discharge.   2 - Small Umbilical Hernia - s/p umbilical hernia repair at time of prostatectomy above.   Consults: None  Significant Diagnostic Studies: labs: pathology - pending  Treatments: surgery:  robotic prostatectomy with bilateral template + sentinal ICG pelvic lymphadenectomy and umbilical hernia repair on 1/28  Discharge Exam: Blood pressure 161/88, pulse 85, temperature 98.8 F (37.1 C), temperature source Oral, resp. rate 16, height 5\' 5"  (1.651 m), weight 80.287 kg (177 lb), SpO2 99 %. General appearance: alert, cooperative and appears stated age Eyes: negative Nose: Nares normal. Septum midline. Mucosa normal. No drainage or sinus tenderness. Throat: lips, mucosa, and tongue normal; teeth and gums normal Neck: supple, symmetrical, trachea midline Back: symmetric, no curvature. ROM normal. No CVA tenderness. Resp: non-labored on room air Cardio: Nl rate GI: soft, non-tender; bowel sounds normal; no masses,  no organomegaly Male genitalia: normal, foley c/d/i with clear uriene, scan blood at meatus as expected Extremities: extremities normal, atraumatic, no cyanosis or edema Pulses: 2+ and symmetric Skin: Skin color, texture, turgor normal. No rashes or lesions Lymph nodes: Cervical, supraclavicular, and  axillary nodes normal. Neurologic: Grossly normal Incision/Wound: recent port sites / extraction (hernia repair) sites c/d/i. JP removed and dry dressing applied.   Disposition: 01-Home or Self Care     Medication List    STOP taking these medications        aspirin EC 81 MG tablet     GOODYS EXTRA STRENGTH 500-325-65 MG Pack  Generic drug:  Aspirin-Acetaminophen-Caffeine      TAKE these medications        atorvastatin 20 MG tablet  Commonly known as:  LIPITOR  Take 20 mg by mouth daily.     carvedilol 12.5 MG tablet  Commonly known as:  COREG  Take 12.5 mg by mouth 2 (two) times daily.     ciprofloxacin 500 MG tablet  Commonly known as:  CIPRO  Take 1 tablet (500 mg total) by mouth 2 (two) times daily. Start day prior to office visit for foley removal     glimepiride 2 MG tablet  Commonly known as:  AMARYL  Take 2 mg by mouth daily.     hydrALAZINE 50 MG tablet  Commonly known as:  APRESOLINE  Take 1 tablet (50 mg total) by mouth 3 (three) times daily.     isosorbide mononitrate 30 MG 24 hr tablet  Commonly known as:  IMDUR  Take 30 mg by mouth 2 (two) times daily.     lisinopril 20 MG tablet  Commonly known as:  PRINIVIL,ZESTRIL  Take 20 mg by mouth 2 (two) times daily.     oxyCODONE-acetaminophen 5-325 MG per tablet  Commonly known as:  PERCOCET/ROXICET  Take 1-2 tablets by mouth every 6 (six) hours as needed for moderate pain.           Follow-up Information    Follow up  with [name], MD On 03/06/2014.   Specialty:  Urology   Why:  at 10:45   Contact information:   [address] [place] [address] [phone]       Signed: [name] 02/24/2014, 7:19 AM    Final pathology confirms metastatic prostate cancer with positive lymph node. Final post-op diagnosis Metastatic Prostate Cancer.

## 2014-03-06 ENCOUNTER — Ambulatory Visit: Payer: BC Managed Care – PPO | Admitting: Internal Medicine

## 2014-03-21 ENCOUNTER — Other Ambulatory Visit: Payer: Self-pay | Admitting: Internal Medicine

## 2014-05-11 ENCOUNTER — Ambulatory Visit (INDEPENDENT_AMBULATORY_CARE_PROVIDER_SITE_OTHER): Payer: BLUE CROSS/BLUE SHIELD | Admitting: Internal Medicine

## 2014-05-11 ENCOUNTER — Encounter: Payer: Self-pay | Admitting: Internal Medicine

## 2014-05-11 VITALS — BP 190/92 | HR 71 | Ht 65.0 in | Wt 188.4 lb

## 2014-05-11 DIAGNOSIS — I493 Ventricular premature depolarization: Secondary | ICD-10-CM

## 2014-05-11 DIAGNOSIS — I10 Essential (primary) hypertension: Secondary | ICD-10-CM

## 2014-05-11 NOTE — Progress Notes (Signed)
Primary Care Physician: No PCP Per Patient Referring Physician:  Dr Samara Snide is a 59 y.o. male with a h/o nonischemic cardiomyopathy and PVCs s/p RVOT ablation who presents today for EP follow-up.  His PVCs are significantly improved.  His EF has improved.  His primary concern today is with prostate cancer.  He is s/p prostatectomy and has XRT planned.  His BP is elevated.  He did not taken his medicine this am. Today, he denies symptoms of chest pain,  orthopnea, PND, lower extremity edema, dizziness, presyncope, syncope, or neurologic sequela. The patient is tolerating medications without difficulties and is otherwise without complaint today.   Past Medical History  Diagnosis Date  . CAD     nonobst cath 2004  . CARDIOMYOPATHY     NICM, LVEF 30% 01/2011 echo; 20-25% 07/2012 echo  . DIABETES MELLITUS, TYPE II   . HYPERCHOLESTEROLEMIA   . HYPERTENSION   . PSA, INCREASED   . Noncompliance   . CHF (congestive heart failure)   . PVCs (premature ventricular contractions)     s/p ablation 01-07-2013 by Dr Rayann Heman  . Asthma     years ago   . Arthritis     lower back   . Cancer     prostate cancer   Past Surgical History  Procedure Laterality Date  . Cardiac catheterization  2004    nonobst dz  . Surgery scrotal / testicular      at age 58  . Ablation  01-07-2013    RVOT PVC's ablated by Dr Rayann Heman along anteroseptal RVOT  . Cholecystectomy N/A 11/16/2013    Procedure: LAPAROSCOPIC CHOLECYSTECTOMY WITH INTRAOPERATIVE CHOLANGIOGRAM;  Surgeon: [name], MD;  Location: [facility];  Service: General;  Laterality: N/A;  . Supraventricular tachycardia ablation N/A 09/28/2012    Procedure: Tanna Furry Ablation;  Surgeon: [name], MD;  Location: [facility] CATH LAB;  Service: Cardiovascular;  Laterality: N/A;  . V-tach ablation N/A 01/07/2013    Procedure: V-TACH ABLATION;  Surgeon: [name], MD;  Location: [place] CATH LAB;  Service: Cardiovascular;  Laterality: N/A;  .  Robot assisted laparoscopic radical prostatectomy N/A 02/22/2014    Procedure: ROBOTIC ASSISTED LAPAROSCOPIC RADICAL PROSTATECTOMY WITH INDOCYANINE GREEN DYE AND OPEN UMBILICAL HERNIA REPAIR;  Surgeon: [name], MD;  Location: [facility];  Service: Urology;  Laterality: N/A;  . Lymphadenectomy Bilateral 02/22/2014    Procedure: PELVIC LYMPH NODE DISSECTION;  Surgeon: [name], MD;  Location: [facility];  Service: Urology;  Laterality: Bilateral;    Current Outpatient Prescriptions  Medication Sig Dispense Refill  . atorvastatin (LIPITOR) 20 MG tablet Take 20 mg by mouth daily.    . carvedilol (COREG) 12.5 MG tablet TAKE ONE TABLET BY MOUTH TWICE DAILY WITH A MEAL 60 tablet 1  . glimepiride (AMARYL) 2 MG tablet Take 2 mg by mouth daily.    . hydrALAZINE (APRESOLINE) 50 MG tablet Take 1 tablet (50 mg total) by mouth 3 (three) times daily. 90 tablet 6  . isosorbide mononitrate (IMDUR) 30 MG 24 hr tablet Take 60 mg by mouth daily.     [name] lisinopril (PRINIVIL,ZESTRIL) 20 MG tablet Take 20 mg by mouth 2 (two) times daily.     No current facility-administered medications for this visit.    Allergies  Allergen Reactions  . Ambien [Zolpidem Tartrate] Other (See Comments)    "Went berserk."    History   Social History  . Marital Status: Single    Spouse Name: N/A  .  Number of Children: N/A  . Years of Education: N/A   Occupational History  . Health services    Social History Main Topics  . Smoking status: Never Smoker   . 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 Alcohol Use: No     Comment: rare  . Drug Use: No  . Sexual Activity: No   Other Topics Concern  . Not on file   Social History Narrative   Single - divorced x 3 -   Lives with 2 sons, enjoys spending time with 6 g-kids and 3 kids    Family History  Problem Relation Age of Onset  . Peripheral vascular disease    . Hypertension    . Prostate cancer    .  Hypertension Mother   . Peripheral vascular disease Mother   . Prostate cancer Brother     ROS- All systems are reviewed and negative except as per the HPI above  Physical Exam: Filed Vitals:   05/11/14 1211  BP: 190/92  Pulse: 71  Height: 5\' 5"  (1.651 m)  Weight: 188 lb 6.4 oz (85.458 kg)  repeat by MD is 180/96  GEN- The patient is well appearing, alert and oriented x 3 today.   Head- normocephalic, atraumatic Eyes-  Sclera clear, conjunctiva pink Ears- hearing intact Oropharynx- clear Neck- supple, no JVP Lymph- no cervical lymphadenopathy Lungs- Clear to ausculation bilaterally, normal work of breathing Heart- Regular rate and rhythm, no murmurs, rubs or gallops, PMI not laterally displaced GI- soft, NT, ND, + BS Extremities- no clubbing, cyanosis, or edema MS- no significant deformity or atrophy Skin- no rash or lesion Psych- euthymic mood, full affect Neuro- strength and sensation are intact  ekg today reveals sinus rhythm with no pvcs, LVH with repolarization changes Echo 2015 is reviewed  Assessment and Plan:  1. PVCs Doing well s/p ablation No further EP workup is planned  2. Nonischemic CM stable  3. HTN Elevated Compliance with medicine encouraged 2 gram sodium diet Follow-up with CHF clinic.  EP to see as needed going forward

## 2014-05-11 NOTE — Patient Instructions (Addendum)
Your physician recommends that you schedule a follow-up appointment as needed with Dr Rayann Heman  Go home take your BP medications  Make sure you have a follow up with Dr Haroldine Laws  [phone]  Low-Sodium Eating Plan Sodium raises blood pressure and causes water to be held in the body. Getting less sodium from food will help lower your blood pressure, reduce any swelling, and protect your heart, liver, and kidneys. We get sodium by adding salt (sodium chloride) to food. Most of our sodium comes from canned, boxed, and frozen foods. Restaurant foods, fast foods, and pizza are also very high in sodium. Even if you take medicine to lower your blood pressure or to reduce fluid in your body, getting less sodium from your food is important. WHAT IS MY PLAN? Most people should limit their sodium intake to 2,300 mg a day. Your health care provider recommends that you limit your sodium intake to 2 grams a day.  WHAT DO I NEED TO KNOW ABOUT THIS EATING PLAN? For the low-sodium eating plan, you will follow these general guidelines:  Choose foods with a % Daily Value for sodium of less than 5% (as listed on the food label).   Use salt-free seasonings or herbs instead of table salt or sea salt.   Check with your health care provider or pharmacist before using salt substitutes.   Eat fresh foods.  Eat more vegetables and fruits.  Limit canned vegetables. If you do use them, rinse them well to decrease the sodium.   Limit cheese to 1 oz (28 g) per day.   Eat lower-sodium products, often labeled as "lower sodium" or "no salt added."  Avoid foods that contain monosodium glutamate (MSG). MSG is sometimes added to [place] food and some canned foods.  Check food labels (Nutrition Facts labels) on foods to learn how much sodium is in one serving.  Eat more home-cooked food and less restaurant, buffet, and fast food.  When eating at a restaurant, ask that your food be prepared with less salt or none,  if possible.  HOW DO I READ FOOD LABELS FOR SODIUM INFORMATION? The Nutrition Facts label lists the amount of sodium in one serving of the food. If you eat more than one serving, you must multiply the listed amount of sodium by the number of servings. Food labels may also identify foods as:  Sodium free--Less than 5 mg in a serving.  Very low sodium--35 mg or less in a serving.  Low sodium--140 mg or less in a serving.  Light in sodium--50% less sodium in a serving. For example, if a food that usually has 300 mg of sodium is changed to become light in sodium, it will have 150 mg of sodium.  Reduced sodium--25% less sodium in a serving. For example, if a food that usually has 400 mg of sodium is changed to reduced sodium, it will have 300 mg of sodium. WHAT FOODS CAN I EAT? Grains Low-sodium cereals, including oats, puffed wheat and rice, and shredded wheat cereals. Low-sodium crackers. Unsalted rice and pasta. Lower-sodium bread.  Vegetables Frozen or fresh vegetables. Low-sodium or reduced-sodium canned vegetables. Low-sodium or reduced-sodium tomato sauce and paste. Low-sodium or reduced-sodium tomato and vegetable juices.  Fruits Fresh, frozen, and canned fruit. Fruit juice.  Meat and Other Protein Products Low-sodium canned tuna and salmon. Fresh or frozen meat, poultry, seafood, and fish. Lamb. Unsalted nuts. Dried beans, peas, and lentils without added salt. Unsalted canned beans. Homemade soups without salt. Eggs.  Dairy  Milk. Soy milk. Ricotta cheese. Low-sodium or reduced-sodium cheeses. Yogurt.  Condiments Fresh and dried herbs and spices. Salt-free seasonings. Onion and garlic powders. Low-sodium varieties of mustard and ketchup. Lemon juice.  Fats and Oils Reduced-sodium salad dressings. Unsalted butter.  Other Unsalted popcorn and pretzels.  The items listed above may not be a complete list of recommended foods or beverages. Contact your dietitian for more  options. WHAT FOODS ARE NOT RECOMMENDED? Grains Instant hot cereals. Bread stuffing, pancake, and biscuit mixes. Croutons. Seasoned rice or pasta mixes. Noodle soup cups. Boxed or frozen macaroni and cheese. Self-rising flour. Regular salted crackers. Vegetables Regular canned vegetables. Regular canned tomato sauce and paste. Regular tomato and vegetable juices. Frozen vegetables in sauces. Salted french fries. Olives. Angie Fava. Relishes. Sauerkraut. Salsa. Meat and Other Protein Products Salted, canned, smoked, spiced, or pickled meats, seafood, or fish. Bacon, ham, sausage, hot dogs, corned beef, chipped beef, and packaged luncheon meats. Salt pork. Jerky. Pickled herring. Anchovies, regular canned tuna, and sardines. Salted nuts. Dairy Processed cheese and cheese spreads. Cheese curds. Blue cheese and cottage cheese. Buttermilk.  Condiments Onion and garlic salt, seasoned salt, table salt, and sea salt. Canned and packaged gravies. Worcestershire sauce. Tartar sauce. Barbecue sauce. Teriyaki sauce. Soy sauce, including reduced sodium. Steak sauce. Fish sauce. Oyster sauce. Cocktail sauce. Horseradish. Regular ketchup and mustard. Meat flavorings and tenderizers. Bouillon cubes. Hot sauce. Tabasco sauce. Marinades. Taco seasonings. Relishes. Fats and Oils Regular salad dressings. Salted butter. Margarine. Ghee. Bacon fat.  Other Potato and tortilla chips. Corn chips and puffs. Salted popcorn and pretzels. Canned or dried soups. Pizza. Frozen entrees and pot pies.  The items listed above may not be a complete list of foods and beverages to avoid. Contact your dietitian for more information. Document Released: 07/05/2001 Document Revised: 01/18/2013 Document Reviewed: 11/17/2012 [facility] Patient Information 2015 [place], [place]. This information is not intended to replace advice given to you by your health care provider. Make sure you discuss any questions you have with your health care  provider.

## 2014-05-25 ENCOUNTER — Other Ambulatory Visit: Payer: Self-pay | Admitting: Internal Medicine

## 2014-10-25 ENCOUNTER — Encounter: Payer: Self-pay | Admitting: Family

## 2014-10-25 ENCOUNTER — Ambulatory Visit (INDEPENDENT_AMBULATORY_CARE_PROVIDER_SITE_OTHER): Payer: BLUE CROSS/BLUE SHIELD | Admitting: Family

## 2014-10-25 ENCOUNTER — Telehealth: Payer: Self-pay | Admitting: General Practice

## 2014-10-25 VITALS — BP 210/120 | HR 109 | Temp 98.0°F | Resp 18 | Ht 65.0 in | Wt 196.0 lb

## 2014-10-25 DIAGNOSIS — I10 Essential (primary) hypertension: Secondary | ICD-10-CM

## 2014-10-25 DIAGNOSIS — M79651 Pain in right thigh: Secondary | ICD-10-CM

## 2014-10-25 DIAGNOSIS — Z23 Encounter for immunization: Secondary | ICD-10-CM | POA: Diagnosis not present

## 2014-10-25 DIAGNOSIS — M79659 Pain in unspecified thigh: Secondary | ICD-10-CM | POA: Insufficient documentation

## 2014-10-25 MED ORDER — ISOSORBIDE MONONITRATE ER 60 MG PO TB24: 60.0000 mg | ORAL_TABLET | Freq: Every day | ORAL | Status: DC

## 2014-10-25 MED ORDER — CARVEDILOL 12.5 MG PO TABS: 12.5000 mg | ORAL_TABLET | Freq: Every day | ORAL | Status: DC

## 2014-10-25 MED ORDER — LISINOPRIL 20 MG PO TABS: 20.0000 mg | ORAL_TABLET | Freq: Two times a day (BID) | ORAL | Status: DC

## 2014-10-25 NOTE — Progress Notes (Signed)
Pre visit review using our clinic review tool, if applicable. No additional management support is needed unless otherwise documented below in the visit note. 

## 2014-10-25 NOTE — Assessment & Plan Note (Signed)
Hypertension remains uncontrolled with current regimen secondary to medication running out and lack of refills. Refill lisinopril, isosorbide mononitrate, and carvedilol. Indicates already taking hydralazine. Denies any symptoms of end organ damage. Continue current dosages of lisinopril, isosorbide mononitrate, carvedilol and hydralzine. Follow up in 2 weeks for recheck and BMET.

## 2014-10-25 NOTE — Assessment & Plan Note (Signed)
Symptoms and exam consistent with possible thigh strain, however cannot rule out radicular pain from his back. Treat conservatively at this time with over-the-counter medications as needed for symptom relief, home exercise therapy, and ice/heat as needed. Follow-up in 2 weeks to determine effectiveness

## 2014-10-25 NOTE — Patient Instructions (Addendum)
Thank you for choosing [facility].  Summary/Instructions:  Your prescription(s) have been submitted to your pharmacy or been printed and provided for you. Please take as directed and contact our office if you believe you are having problem(s) with the medication(s) or have any questions.  If your symptoms worsen or fail to improve, please contact our office for further instruction, or in case of emergency go directly to the emergency room at the closest medical facility.   For your thigh - ice/heat 2-3 times per day or more as needed for discomfort. Cannot rule out your back as a source of the pain. Continue over the counter tylenol as needed for pain.   TREATMENT  Treatment initially involves the use of ice and medication to help reduce pain and inflammation. The use of strengthening and stretching exercises may help reduce pain with activity. These exercises may be performed at home or with referral to a therapist. Crutches may be recommended to allow the muscle to rest until walking can be completed without limping. Surgery is rarely necessary for this injury, but may be considered if the injury involves a grade 3 strain, or if symptoms persist for greater than 3 months despite non-surgical (conservative) treatment.  MEDICATION  If pain medication is necessary, then nonsteroidal anti-inflammatory medications, such as aspirin and ibuprofen, or other minor pain relievers, such as acetaminophen, are often recommended.  Do not take pain medication for 7 days before surgery.  Prescription pain relievers may be given if deemed necessary by your caregiver. Use only as directed and only as much as you need.  Ointments applied to the skin may be helpful.  Corticosteroid injections may be given by your caregiver. These injections should be reserved for the most serious cases, because they may only be given a certain number of times. HEAT AND COLD  Cold treatment (icing) relieves pain and  reduces inflammation. Cold treatment should be applied for 10 to 15 minutes every 2 to 3 hours for inflammation and pain and immediately after any activity that aggravates your symptoms. Use ice packs or massage the area with a piece of ice (ice massage).  Heat treatment may be used prior to performing the stretching and strengthening activities prescribed by your caregiver, physical therapist, or athletic trainer. Use a heat pack or soak the injury in warm water. SEEK MEDICAL CARE IF:  Treatment seems to offer no benefit, or the condition worsens.  Any medications produce adverse side effects. EXERCISES  RANGE OF MOTION (ROM) AND STRETCHING EXERCISES - Quadriceps Strain These exercises may help you when beginning to rehabilitate your injury. Your symptoms may resolve with or without further involvement from your physician, physical therapist or athletic trainer. While completing these exercises, remember:   Restoring tissue flexibility helps normal motion to return to the joints. This allows healthier, less painful movement and activity.  An effective stretch should be held for at least 30 seconds.  A stretch should never be painful. You should only feel a gentle lengthening or release in the stretched tissue. RANGE OF MOTION - Knee Flexion, Active  Lie on your back with both knees straight. (If this causes back discomfort, bend your opposite knee, placing your foot flat on the floor.)  Slowly slide your heel back toward your buttocks until you feel a gentle stretch in the front of your knee or thigh.  Hold for __________ seconds. Slowly slide your heel back to the starting position. Repeat __________ times. Complete this exercise __________ times per day.  STRETCH - Quadriceps, Prone  Lie on your stomach on a firm surface, such as a bed or padded floor.  Bend your right / left knee and grasp your ankle. If you are unable to reach, your ankle or pant leg, use a belt around your foot to  lengthen your reach.  Gently pull your heel toward your buttocks. Your knee should not slide out to the side. You should feel a stretch in the front of your thigh and/or knee.  Hold this position for __________ seconds. Repeat __________ times. Complete this stretch __________ times per day.  STRETCHING - Hip Flexors, Lunge  Half kneel with your right / left knee on the floor and your opposite knee bent and directly over your ankle.  Keep good posture with your head over your shoulders. Tighten your buttocks to point your tailbone downward; this will prevent your back from arching too much.  You should feel a gentle stretch in the front of your thigh and/or hip. If you do not feel any resistance, slightly slide your opposite foot forward and then slowly lunge forward so your knee once again lines up over your ankle. Be sure your tailbone remains pointed downward.  Hold this stretch for __________ seconds. Repeat __________ times. Complete this stretch __________ times per day. STRENGTHENING EXERCISES - Quadriceps Strain These exercises may help you when beginning to rehabilitate your injury. They may resolve your symptoms with or without further involvement from your physician, physical therapist or athletic trainer. While completing these exercises, remember:   Muscles can gain both the endurance and the strength needed for everyday activities through controlled exercises.  Complete these exercises as instructed by your physician, physical therapist or athletic trainer. Progress the resistance and repetitions only as guided. STRENGTH - Quadriceps, Isometrics  Lie on your back with your right / left leg extended and your opposite knee bent.  Gradually tense the muscles in the front of your right / left thigh. You should see either your knee cap slide up toward your hip or increased dimpling just above the knee. This motion will push the back of the knee down toward the floor/mat/bed on  which you are lying.  Hold the muscle as tight as you can without increasing your pain for __________ seconds.  Relax the muscles slowly and completely in between each repetition. Repeat __________ times. Complete this exercise __________ times per day.  STRENGTH - Quadriceps, Short Arcs   Lie on your back. Place a __________ inch towel roll under your knee so that the knee slightly bends.  Raise only your lower leg by tightening the muscles in the front of your thigh. Do not allow your thigh to rise.  Hold this position for __________ seconds. Repeat __________ times. Complete this exercise __________ times per day.  OPTIONAL ANKLE WEIGHTS: Begin with ____________________, but DO NOT exceed ____________________. Increase in1 lb/0.5 kg increments. STRENGTH - Quadriceps, Straight Leg Raises  Quality counts! Watch for signs that the quadriceps muscle is working to insure you are strengthening the correct muscles and not "cheating" by substituting with healthier muscles.  Lay on your back with your right / left leg extended and your opposite knee bent.  Tense the muscles in the front of your right / left thigh. You should see either your knee cap slide up or increased dimpling just above the knee. Your thigh may even quiver.  Tighten these muscles even more and raise your leg 4 to 6 inches off the floor. Hold for __________  seconds.  Keeping these muscles tense, lower your leg.  Relax the muscles slowly and completely in between each repetition. Repeat __________ times. Complete this exercise __________ times per day.  STRENGTH - Quadriceps, Wall Slides  Follow guidelines for form closely. Increased knee pain often results from poorly placed feet or knees.  Lean against a smooth wall or door and walk your feet out 18-24 inches. Place your feet hip-width apart.  Slowly slide down the wall or door until your knees bend __________ degrees.* Keep your knees over your heels, not your toes,  and in line with your hips, not falling to either side.  Hold for __________ seconds. Stand up to rest for __________ seconds in between each repetition. Repeat __________ times. Complete this exercise __________ times per day. * Your physician, physical therapist or athletic trainer will alter this angle based on your symptoms and progress. STRENGTH - Quadriceps, Step-Ups   Use a thick book, step or step stool that is __________ inches tall.  Holding a wall or counter for balance only, not support.  Slowly step-up with your right / left foot, keeping your knee in line with your hip and foot. Do not allow your knee to bend so far that you cannot see your toes.  Slowly unlock your knee and lower yourself to the starting position. Your muscles, not gravity, should lower you. Repeat __________ times. Complete this exercise __________ times per day. Document Released: 01/13/2005 Document Revised: 04/07/2011 Document Reviewed: 04/27/2008 [facility] Patient Information 2015 [place], [place]. This information is not intended to replace advice given to you by your health care provider. Make sure you discuss any questions you have with your health care provider.

## 2014-10-25 NOTE — Progress Notes (Signed)
Subjective:    Patient ID: [name], male    DOB: December 16, 1955, 59 y.o.   MRN: [id number]  Chief Complaint  Patient presents with  . Leg Pain    x1 month has a pain that goes from his lower right back down his right leg, would like that addressed instead of CPE, has not had BP medicine in a couple months    HPI:  [name] is a 59 y.o. male who  has a past medical history of CAD; CARDIOMYOPATHY; DIABETES MELLITUS, TYPE II; HYPERCHOLESTEROLEMIA; HYPERTENSION; PSA, INCREASED; Noncompliance; CHF (congestive heart failure); PVCs (premature ventricular contractions); Asthma; Arthritis; and Cancer. and presents today for a follow up visit.   1.) Right hip pain - this is a new problem. Associated symptom of pain located in his right hip has been going on for a while. Pain is sharp. Modifying factors include an over the counter gel that takes away the pain in addition the 3 extra strength Tylenol. Timing of the symptoms is worse in the morning also at night when going to bed. When he walks he notes that the pain is worse. Severity of the pain 6-7/10. Functionality is mildly effected, however is able to complete work. Denies any back pain. Denies trauma or sounds/sensation heard or felt.   2.) Hypertension - Currently maintained on lisinopril, hydralzine, isosorbide mononitrate and carvediolol. Indicates that he has not had his blood pressure in over 1 month secondary to needing an office visit. Denies any headaches, blurred, chest pain, shortness of breath, or palpitations. Eye exam is up to date.   BP Readings from Last 3 Encounters:  10/25/14 210/120  05/11/14 190/92  02/24/14 154/94    Allergies  Allergen Reactions  . Ambien [Zolpidem Tartrate] Other (See Comments)    "Went berserk."     Current Outpatient Prescriptions on File Prior to Visit  Medication Sig Dispense Refill  . atorvastatin (LIPITOR) 20 MG tablet Take 20 mg by mouth daily.    [name] glimepiride (AMARYL) 2 MG  tablet Take 2 mg by mouth daily.    . hydrALAZINE (APRESOLINE) 50 MG tablet Take 1 tablet (50 mg total) by mouth 3 (three) times daily. 90 tablet 6   No current facility-administered medications on file prior to visit.    Past Medical History  Diagnosis Date  . CAD     nonobst cath 2004  . CARDIOMYOPATHY     NICM, LVEF 30% 01/2011 echo; 20-25% 07/2012 echo  . DIABETES MELLITUS, TYPE II   . HYPERCHOLESTEROLEMIA   . HYPERTENSION   . PSA, INCREASED   . Noncompliance   . CHF (congestive heart failure)   . PVCs (premature ventricular contractions)     s/p ablation 01-07-2013 by Dr Rayann Heman  . Asthma     years ago   . Arthritis     lower back   . Cancer     prostate cancer     Past Surgical History  Procedure Laterality Date  . Cardiac catheterization  2004    nonobst dz  . Surgery scrotal / testicular      at age 53  . Ablation  01-07-2013    RVOT PVC's ablated by Dr Rayann Heman along anteroseptal RVOT  . Cholecystectomy N/A 11/16/2013    Procedure: LAPAROSCOPIC CHOLECYSTECTOMY WITH INTRAOPERATIVE CHOLANGIOGRAM;  Surgeon: [name], MD;  Location: [facility];  Service: General;  Laterality: N/A;  . Supraventricular tachycardia ablation N/A 09/28/2012    Procedure: Tanna Furry Ablation;  Surgeon: [name],  MD;  Location: [facility];  Service: Cardiovascular;  Laterality: N/A;  . V-tach ablation N/A 01/07/2013    Procedure: V-TACH ABLATION;  Surgeon: [name], MD;  Location: [facility];  Service: Cardiovascular;  Laterality: N/A;  . Robot assisted laparoscopic radical prostatectomy N/A 02/22/2014    Procedure: ROBOTIC ASSISTED LAPAROSCOPIC RADICAL PROSTATECTOMY WITH INDOCYANINE GREEN DYE AND OPEN UMBILICAL HERNIA REPAIR;  Surgeon: [name], MD;  Location: [facility];  Service: Urology;  Laterality: N/A;  . Lymphadenectomy Bilateral 02/22/2014    Procedure: PELVIC LYMPH NODE DISSECTION;  Surgeon: [name], MD;  Location: [facility];  Service: Urology;  Laterality: Bilateral;      Review of Systems  Constitutional: Negative for fever and diaphoresis.  Eyes:       Negative in changes for vision.   Respiratory: Negative for chest tightness and shortness of breath.   Cardiovascular: Negative for chest pain, palpitations and leg swelling.  Musculoskeletal: Positive for arthralgias.  Neurological: Negative for headaches.      Objective:    BP 210/120 mmHg  Pulse 109  Temp(Src) 98 F (36.7 C) (Oral)  Resp 18  Ht 5\' 5"  (1.651 m)  Wt 196 lb (88.905 kg)  BMI 32.62 kg/m2  SpO2 96% Nursing note and vital signs reviewed.  Physical Exam  Constitutional: He is oriented to person, place, and time. He appears well-developed and well-nourished. No distress.  Cardiovascular: Normal rate, regular rhythm, normal heart sounds and intact distal pulses.   Pulmonary/Chest: Effort normal and breath sounds normal.  Musculoskeletal:  Right thigh - no obvious deformity, discoloration, or edema noted. Tenderness noted over rectus from Morris muscle belly. Active range of motion results in discomfort but is intact and appropriate. Passive range of motion presents with no pain. Distal pulses, sensation, and reflexes are intact and appropriate.  Neurological: He is alert and oriented to person, place, and time.  Skin: Skin is warm and dry.  Psychiatric: He has a normal mood and affect. His behavior is normal. Judgment and thought content normal.       Assessment & Plan:   Problem List Items Addressed This Visit      Cardiovascular and Mediastinum   HTN (hypertension), malignant - Primary    Hypertension remains uncontrolled with current regimen secondary to medication running out and lack of refills. Refill lisinopril, isosorbide mononitrate, and carvedilol. Indicates already taking hydralazine. Denies any symptoms of end organ damage. Continue current dosages of lisinopril, isosorbide mononitrate, carvedilol and hydralzine. Follow up in 2 weeks for recheck and BMET.        Relevant Medications   isosorbide mononitrate (IMDUR) 60 MG 24 hr tablet   lisinopril (PRINIVIL,ZESTRIL) 20 MG tablet   carvedilol (COREG) 12.5 MG tablet     Other   Thigh pain    Symptoms and exam consistent with possible thigh strain, however cannot rule out radicular pain from his back. Treat conservatively at this time with over-the-counter medications as needed for symptom relief, home exercise therapy, and ice/heat as needed. Follow-up in 2 weeks to determine effectiveness       Other Visit Diagnoses    Encounter for immunization

## 2014-11-08 ENCOUNTER — Ambulatory Visit (INDEPENDENT_AMBULATORY_CARE_PROVIDER_SITE_OTHER): Payer: BLUE CROSS/BLUE SHIELD | Admitting: Family

## 2014-11-08 ENCOUNTER — Encounter: Payer: Self-pay | Admitting: Family

## 2014-11-08 VITALS — BP 158/100 | HR 77 | Temp 98.5°F | Resp 18 | Ht 65.0 in | Wt 196.0 lb

## 2014-11-08 DIAGNOSIS — I10 Essential (primary) hypertension: Secondary | ICD-10-CM | POA: Diagnosis not present

## 2014-11-08 MED ORDER — HYDROCHLOROTHIAZIDE 25 MG PO TABS: 25.0000 mg | ORAL_TABLET | Freq: Every day | ORAL | Status: DC

## 2014-11-08 NOTE — Assessment & Plan Note (Signed)
Blood pressure improved with restarting medications however remains above goal of 140/90 with current regimen. Start hydrochlorothiazide. Continue current dosage of lisinopril, isosorbide mononitrate, hydralazine, and carvedilol. Follow up with cardiology for heart failure and resistant hypertension. Continue to monitor blood pressure at home. Follow-up in one month.

## 2014-11-08 NOTE — Patient Instructions (Addendum)
Thank you for choosing [facility].  Summary/Instructions:  Please follow up with cardiology at your convenience.  Continue to take your medications as prescribed.   Please continue to monitor your blood pressure at home.  Your prescription(s) have been submitted to your pharmacy or been printed and provided for you. Please take as directed and contact our office if you believe you are having problem(s) with the medication(s) or have any questions.  If your symptoms worsen or fail to improve, please contact our office for further instruction, or in case of emergency go directly to the emergency room at the closest medical facility.

## 2014-11-08 NOTE — Progress Notes (Signed)
Subjective:    Patient ID: [name], male    DOB: Jun 03, 1955, 59 y.o.   MRN: [id number]  Chief Complaint  Patient presents with  . Follow-up    hypertension    HPI:  [name] is a 59 y.o. male who  has a past medical history of CAD; CARDIOMYOPATHY; DIABETES MELLITUS, TYPE II; HYPERCHOLESTEROLEMIA; HYPERTENSION; PSA, INCREASED; Noncompliance; CHF (congestive heart failure) ([place]); PVCs (premature ventricular contractions); Asthma; Arthritis; and Cancer ([place]). and presents today for a follow up office visit.   1.) Hypertension - currently maintained on carvedilol, hydralazine, isosorbide mononitrate, and lisinopril. Last office visit he was out of several medications. Medications were refilled at that time. Takes medications as prescribed and denies adverse side effects or hypotensive events. Denies new/additional signs of end organ damage.  BP Readings from Last 3 Encounters:  11/08/14 158/100  10/25/14 210/120  05/11/14 190/92    Allergies  Allergen Reactions  . Ambien [Zolpidem Tartrate] Other (See Comments)    "Went berserk."     Current Outpatient Prescriptions on File Prior to Visit  Medication Sig Dispense Refill  . atorvastatin (LIPITOR) 20 MG tablet Take 20 mg by mouth daily.    . carvedilol (COREG) 12.5 MG tablet Take 1 tablet (12.5 mg total) by mouth daily. 90 tablet 1  . glimepiride (AMARYL) 2 MG tablet Take 2 mg by mouth daily.    . hydrALAZINE (APRESOLINE) 50 MG tablet Take 1 tablet (50 mg total) by mouth 3 (three) times daily. 90 tablet 6  . isosorbide mononitrate (IMDUR) 60 MG 24 hr tablet Take 1 tablet (60 mg total) by mouth daily. 90 tablet 1  . lisinopril (PRINIVIL,ZESTRIL) 20 MG tablet Take 1 tablet (20 mg total) by mouth 2 (two) times daily. 180 tablet 1   No current facility-administered medications on file prior to visit.     Past Surgical History  Procedure Laterality Date  . Cardiac catheterization  2004    nonobst dz  . Surgery  scrotal / testicular      at age 59  . Ablation  01-07-2013    RVOT PVC's ablated by Dr Rayann Heman along anteroseptal RVOT  . Cholecystectomy N/A 11/16/2013    Procedure: LAPAROSCOPIC CHOLECYSTECTOMY WITH INTRAOPERATIVE CHOLANGIOGRAM;  Surgeon: [name], MD;  Location: [facility];  Service: General;  Laterality: N/A;  . Supraventricular tachycardia ablation N/A 09/28/2012    Procedure: Tanna Furry Ablation;  Surgeon: [name], MD;  Location: [facility] CATH LAB;  Service: Cardiovascular;  Laterality: N/A;  . V-tach ablation N/A 01/07/2013    Procedure: V-TACH ABLATION;  Surgeon: [name], MD;  Location: [place] CATH LAB;  Service: Cardiovascular;  Laterality: N/A;  . Robot assisted laparoscopic radical prostatectomy N/A 02/22/2014    Procedure: ROBOTIC ASSISTED LAPAROSCOPIC RADICAL PROSTATECTOMY WITH INDOCYANINE GREEN DYE AND OPEN UMBILICAL HERNIA REPAIR;  Surgeon: [name], MD;  Location: [facility];  Service: Urology;  Laterality: N/A;  . Lymphadenectomy Bilateral 02/22/2014    Procedure: PELVIC LYMPH NODE DISSECTION;  Surgeon: [name], MD;  Location: [facility];  Service: Urology;  Laterality: Bilateral;      Review of Systems  Eyes:       Negative for changes in vision.   Respiratory: Negative for chest tightness and shortness of breath.   Cardiovascular: Negative for chest pain, palpitations and leg swelling.  Neurological: Negative for headaches.      Objective:    BP 158/100 mmHg  Pulse 77  Temp(Src) 98.5 F (36.9 C) (Oral)  Resp 18  Ht 5\' 5"  (1.651 m)  Wt 196 lb (88.905 kg)  BMI 32.62 kg/m2  SpO2 98% Nursing note and vital signs reviewed.  Physical Exam  Constitutional: He is oriented to person, place, and time. He appears well-developed and well-nourished. No distress.  Cardiovascular: Normal rate, regular rhythm, normal heart sounds and intact distal pulses.   Pulmonary/Chest: Effort normal and breath sounds normal.  Neurological: He is alert and oriented to person,  place, and time.  Skin: Skin is warm and dry.  Psychiatric: He has a normal mood and affect. His behavior is normal. Judgment and thought content normal.       Assessment & Plan:   Problem List Items Addressed This Visit      Cardiovascular and Mediastinum   HTN (hypertension), malignant - Primary    Blood pressure improved with restarting medications however remains above goal of 140/90 with current regimen. Start hydrochlorothiazide. Continue current dosage of lisinopril, isosorbide mononitrate, hydralazine, and carvedilol. Follow up with cardiology for heart failure and resistant hypertension. Continue to monitor blood pressure at home. Follow-up in one month.      Relevant Medications   hydrochlorothiazide (HYDRODIURIL) 25 MG tablet

## 2014-11-08 NOTE — Progress Notes (Signed)
Pre visit review using our clinic review tool, if applicable. No additional management support is needed unless otherwise documented below in the visit note. 

## 2014-11-13 ENCOUNTER — Other Ambulatory Visit (HOSPITAL_COMMUNITY): Payer: Self-pay | Admitting: Internal Medicine

## 2015-02-04 ENCOUNTER — Other Ambulatory Visit: Payer: Self-pay | Admitting: Family

## 2015-05-11 ENCOUNTER — Other Ambulatory Visit: Payer: Self-pay | Admitting: Family

## 2015-07-10 ENCOUNTER — Encounter (HOSPITAL_COMMUNITY): Payer: Self-pay

## 2015-07-10 ENCOUNTER — Emergency Department (HOSPITAL_COMMUNITY)
Admission: EM | Admit: 2015-07-10 | Discharge: 2015-07-10 | Disposition: A | Discharge status: Home / Self Care | Payer: BLUE CROSS/BLUE SHIELD | Attending: Emergency Medicine | Admitting: Emergency Medicine

## 2015-07-10 DIAGNOSIS — J45909 Unspecified asthma, uncomplicated: Secondary | ICD-10-CM | POA: Diagnosis not present

## 2015-07-10 DIAGNOSIS — I509 Heart failure, unspecified: Secondary | ICD-10-CM | POA: Insufficient documentation

## 2015-07-10 DIAGNOSIS — I110 Hypertensive heart disease with heart failure: Secondary | ICD-10-CM | POA: Diagnosis not present

## 2015-07-10 DIAGNOSIS — I2510 Atherosclerotic heart disease of native coronary artery without angina pectoris: Secondary | ICD-10-CM | POA: Insufficient documentation

## 2015-07-10 DIAGNOSIS — M1990 Unspecified osteoarthritis, unspecified site: Secondary | ICD-10-CM | POA: Diagnosis not present

## 2015-07-10 DIAGNOSIS — Z79899 Other long term (current) drug therapy: Secondary | ICD-10-CM | POA: Diagnosis not present

## 2015-07-10 DIAGNOSIS — E1165 Type 2 diabetes mellitus with hyperglycemia: Secondary | ICD-10-CM | POA: Diagnosis not present

## 2015-07-10 DIAGNOSIS — Z7982 Long term (current) use of aspirin: Secondary | ICD-10-CM | POA: Insufficient documentation

## 2015-07-10 DIAGNOSIS — R739 Hyperglycemia, unspecified: Secondary | ICD-10-CM

## 2015-07-10 DIAGNOSIS — Z8546 Personal history of malignant neoplasm of prostate: Secondary | ICD-10-CM | POA: Insufficient documentation

## 2015-07-10 LAB — BASIC METABOLIC PANEL
Anion gap: 13 (ref 5–15)
BUN: 17 mg/dL (ref 6–20)
CO2: 21 mmol/L — ABNORMAL LOW (ref 22–32)
Calcium: 9.7 mg/dL (ref 8.9–10.3)
Chloride: 97 mmol/L — ABNORMAL LOW (ref 101–111)
Creatinine, Ser: 1.34 mg/dL — ABNORMAL HIGH (ref 0.61–1.24)
GFR calc Af Amer: >60 mL/min (ref >60)
GFR calc non Af Amer: 56 mL/min — ABNORMAL LOW (ref >60)
Glucose, Bld: 511 mg/dL — ABNORMAL HIGH (ref 65–99)
Potassium: 4.8 mmol/L (ref 3.5–5.1)
Sodium: 131 mmol/L — ABNORMAL LOW (ref 135–145)

## 2015-07-10 LAB — URINE MICROSCOPIC-ADD ON
Bacteria, UA: NONE SEEN
WBC, UA: NONE SEEN WBC/hpf (ref 0–5)

## 2015-07-10 LAB — URINALYSIS, ROUTINE W REFLEX MICROSCOPIC
Bilirubin Urine: NEGATIVE
Glucose, UA: >1000 mg/dL — AB
Hgb urine dipstick: NEGATIVE
Ketones, ur: 15 mg/dL — AB
Leukocytes, UA: NEGATIVE
Nitrite: NEGATIVE
Protein, ur: NEGATIVE mg/dL
Specific Gravity, Urine: 1.037 — ABNORMAL HIGH (ref 1.005–1.030)
pH: 6 (ref 5.0–8.0)

## 2015-07-10 LAB — CBC
HCT: 40.8 % (ref 39.0–52.0)
Hemoglobin: 14.7 g/dL (ref 13.0–17.0)
MCH: 30.1 pg (ref 26.0–34.0)
MCHC: 36 g/dL (ref 30.0–36.0)
MCV: 83.6 fL (ref 78.0–100.0)
Platelets: 192 10*3/uL (ref 150–400)
RBC: 4.88 MIL/uL (ref 4.22–5.81)
RDW: 12.6 % (ref 11.5–15.5)
WBC: 6.9 10*3/uL (ref 4.0–10.5)

## 2015-07-10 LAB — CBG MONITORING, ED
Glucose-Capillary: 555 mg/dL (ref 65–99)
Glucose-Capillary: 286 mg/dL — ABNORMAL HIGH (ref 65–99)

## 2015-07-10 MED ORDER — HYDRALAZINE HCL 50 MG PO TABS: 50.0000 mg | ORAL_TABLET | Freq: Three times a day (TID) | ORAL | Status: DC

## 2015-07-10 MED ORDER — SODIUM CHLORIDE 0.9 % IV BOLUS (SEPSIS)
1000.0000 mL | Freq: Once | INTRAVENOUS | Status: AC
  Administered 2015-07-10: 1000 mL via INTRAVENOUS

## 2015-07-10 MED ORDER — ISOSORBIDE MONONITRATE ER 60 MG PO TB24: 60.0000 mg | ORAL_TABLET | Freq: Every day | ORAL | Status: DC

## 2015-07-10 MED ORDER — INSULIN ASPART 100 UNIT/ML ~~LOC~~ SOLN
10.0000 [IU] | Freq: Once | SUBCUTANEOUS | Status: AC
  Administered 2015-07-10: 10 [IU] via SUBCUTANEOUS
  Filled 2015-07-10: qty 1

## 2015-07-10 MED ORDER — GLIMEPIRIDE 2 MG PO TABS: 2.0000 mg | ORAL_TABLET | Freq: Every day | ORAL | Status: DC

## 2015-07-10 MED ORDER — SODIUM CHLORIDE 0.9 % IV SOLN
INTRAVENOUS | Status: DC
  Administered 2015-07-10: 12:00:00 via INTRAVENOUS

## 2015-07-10 MED ORDER — HYDROCHLOROTHIAZIDE 25 MG PO TABS: 25.0000 mg | ORAL_TABLET | Freq: Every day | ORAL | Status: DC

## 2015-07-10 MED ORDER — SODIUM CHLORIDE 0.9 % IV BOLUS (SEPSIS)
2000.0000 mL | Freq: Once | INTRAVENOUS | Status: AC
  Administered 2015-07-10: 2000 mL via INTRAVENOUS

## 2015-07-10 MED ORDER — CARVEDILOL 12.5 MG PO TABS: 12.5000 mg | ORAL_TABLET | Freq: Every day | ORAL | Status: DC

## 2015-07-10 MED ORDER — LISINOPRIL 20 MG PO TABS: 20.0000 mg | ORAL_TABLET | Freq: Two times a day (BID) | ORAL | Status: DC

## 2015-07-10 NOTE — ED Notes (Signed)
Pt has increased cbg.  Pt has not been taking meds x 3 weeks.  Has not been able to get refill script.  No n/v.  Has had increased thirst with urination.  Has not been taking any of his prescribed meds including his bp meds

## 2015-07-10 NOTE — ED Provider Notes (Signed)
CSN: [id number]     Arrival date & time 07/10/15  [id number] History   First MD Initiated Contact with Patient 07/10/15 1008     Chief Complaint  Patient presents with  . Hyperglycemia     (Consider location/radiation/quality/duration/timing/severity/associated sxs/prior Treatment) Patient is a 60 y.o. male presenting with hyperglycemia. The history is provided by the patient.  Hyperglycemia Severity:  Moderate Onset quality:  Gradual Duration:  3 weeks Timing:  Constant Progression:  Worsening Chronicity:  Recurrent Diabetes status:  Controlled with oral medications Time since last antidiabetic medication:  3 weeks Context: noncompliance   Relieved by:  Nothing Ineffective treatments:  None tried Associated symptoms: increased thirst and polyuria   Associated symptoms: no blurred vision     Past Medical History  Diagnosis Date  . CAD     nonobst cath 2004  . CARDIOMYOPATHY     NICM, LVEF 30% 01/2011 echo; 20-25% 07/2012 echo  . DIABETES MELLITUS, TYPE II   . HYPERCHOLESTEROLEMIA   . HYPERTENSION   . PSA, INCREASED   . Noncompliance   . CHF (congestive heart failure) ([place])   . PVCs (premature ventricular contractions)     s/p ablation 01-07-2013 by Dr Rayann Heman  . Asthma     years ago   . Arthritis     lower back   . Cancer [facility])     prostate cancer   Past Surgical History  Procedure Laterality Date  . Cardiac catheterization  2004    nonobst dz  . Surgery scrotal / testicular      at age 32  . Ablation  01-07-2013    RVOT PVC's ablated by Dr Rayann Heman along anteroseptal RVOT  . Cholecystectomy N/A 11/16/2013    Procedure: LAPAROSCOPIC CHOLECYSTECTOMY WITH INTRAOPERATIVE CHOLANGIOGRAM;  Surgeon: [name], MD;  Location: [facility];  Service: General;  Laterality: N/A;  . Supraventricular tachycardia ablation N/A 09/28/2012    Procedure: Tanna Furry Ablation;  Surgeon: [name], MD;  Location: [facility] CATH LAB;  Service: Cardiovascular;  Laterality: N/A;  . V-tach ablation  N/A 01/07/2013    Procedure: V-TACH ABLATION;  Surgeon: [name], MD;  Location: [place] CATH LAB;  Service: Cardiovascular;  Laterality: N/A;  . Robot assisted laparoscopic radical prostatectomy N/A 02/22/2014    Procedure: ROBOTIC ASSISTED LAPAROSCOPIC RADICAL PROSTATECTOMY WITH INDOCYANINE GREEN DYE AND OPEN UMBILICAL HERNIA REPAIR;  Surgeon: [name], MD;  Location: [facility];  Service: Urology;  Laterality: N/A;  . Lymphadenectomy Bilateral 02/22/2014    Procedure: PELVIC LYMPH NODE DISSECTION;  Surgeon: [name], MD;  Location: [facility];  Service: Urology;  Laterality: Bilateral;   Family History  Problem Relation Age of Onset  . Peripheral vascular disease    . Hypertension    . Prostate cancer    . Hypertension Mother   . Peripheral vascular disease Mother   . Prostate cancer Brother    Social History  Substance Use Topics  . Smoking status: Never Smoker   . Smokeless tobacco: Never Used     Comment: works for [employer]  . Alcohol Use: No     Comment: rare    Review of Systems  Eyes: Negative for blurred vision.  Endocrine: Positive for polydipsia and polyuria.  All other systems reviewed and are negative.     Allergies  Ambien  Home Medications   Prior to Admission medications   Medication Sig Start Date End Date Taking? Authorizing Provider  Aspirin-Acetaminophen-Caffeine (GOODY HEADACHE PO) Take 1 packet  by mouth daily as needed (Pain).   Yes Historical Provider, MD  atorvastatin (LIPITOR) 20 MG tablet Take 20 mg by mouth daily.   Yes Historical Provider, MD  carvedilol (COREG) 12.5 MG tablet TAKE ONE TABLET BY MOUTH ONCE DAILY 05/14/15  Yes Golden Circle, FNP  glimepiride (AMARYL) 2 MG tablet Take 2 mg by mouth daily.   Yes Historical Provider, MD  hydrALAZINE (APRESOLINE) 50 MG tablet TAKE ONE TABLET BY MOUTH THREE TIMES DAILY 11/13/14  Yes Jolaine Artist, MD  hydrochlorothiazide (HYDRODIURIL) 25 MG tablet TAKE  ONE TABLET BY MOUTH ONCE DAILY 02/05/15  Yes Golden Circle, FNP  isosorbide mononitrate (IMDUR) 60 MG 24 hr tablet TAKE ONE TABLET BY MOUTH ONCE DAILY 05/14/15  Yes Golden Circle, FNP  lisinopril (PRINIVIL,ZESTRIL) 20 MG tablet TAKE ONE TABLET BY MOUTH TWICE DAILY 05/14/15  Yes Golden Circle, FNP   BP 182/115 mmHg  Pulse 100  Temp(Src) 98.1 F (36.7 C) (Oral)  Resp 16  SpO2 99% Physical Exam  Constitutional: He is oriented to person, place, and time. He appears well-developed and well-nourished.  Non-toxic appearance. No distress.  HENT:  Head: Normocephalic and atraumatic.  Eyes: Conjunctivae, EOM and lids are normal. Pupils are equal, round, and reactive to light.  Neck: Normal range of motion. Neck supple. No tracheal deviation present. No thyroid mass present.  Cardiovascular: Normal rate, regular rhythm and normal heart sounds.  Exam reveals no gallop.   No murmur heard. Pulmonary/Chest: Effort normal and breath sounds normal. No stridor. No respiratory distress. He has no decreased breath sounds. He has no wheezes. He has no rhonchi. He has no rales.  Abdominal: Soft. Normal appearance and bowel sounds are normal. He exhibits no distension. There is no tenderness. There is no rebound and no CVA tenderness.  Musculoskeletal: Normal range of motion. He exhibits no edema or tenderness.  Neurological: He is alert and oriented to person, place, and time. He has normal strength. No cranial nerve deficit or sensory deficit. GCS eye subscore is 4. GCS verbal subscore is 5. GCS motor subscore is 6.  Skin: Skin is warm and dry. No abrasion and no rash noted.  Psychiatric: He has a normal mood and affect. His speech is normal and behavior is normal.  Nursing note and vitals reviewed.   ED Course  Procedures (including critical care time) Labs Review Labs Reviewed  CBG MONITORING, ED - Abnormal; Notable for the following:    Glucose-Capillary 555 (*)    All other components within  normal limits  BASIC METABOLIC PANEL  CBC  URINALYSIS, ROUTINE W REFLEX MICROSCOPIC (NOT AT [facility])  CBG MONITORING, ED    Imaging Review No results found. I have personally reviewed and evaluated these images and lab results as part of my medical decision-making.   EKG Interpretation None      MDM   Final diagnoses:  None    Patient given IV fluids and insulin here. Repeat blood sugar has improved. Will refill his prescription for his oral diabetic medication and follow-up with his doctor    Lacretia Leigh, MD 07/10/15 1320

## 2015-07-10 NOTE — Discharge Instructions (Signed)
Hyperglycemia °Hyperglycemia occurs when the glucose (sugar) in your blood is too high. Hyperglycemia can happen for many reasons, but it most often happens to people who do not know they have diabetes or are not managing their diabetes properly.  °CAUSES  °Whether you have diabetes or not, there are other causes of hyperglycemia. Hyperglycemia can occur when you have diabetes, but it can also occur in other situations that you might not be as aware of, such as: °Diabetes °· If you have diabetes and are having problems controlling your blood glucose, hyperglycemia could occur because of some of the following reasons: °¨ Not following your meal plan. °¨ Not taking your diabetes medications or not taking it properly. °¨ Exercising less or doing less activity than you normally do. °¨ Being sick. °Pre-diabetes °· This cannot be ignored. Before people develop Type 2 diabetes, they almost always have "pre-diabetes." This is when your blood glucose levels are higher than normal, but not yet high enough to be diagnosed as diabetes. Research has shown that some long-term damage to the body, especially the heart and circulatory system, may already be occurring during pre-diabetes. If you take action to manage your blood glucose when you have pre-diabetes, you may delay or prevent Type 2 diabetes from developing. °Stress °· If you have diabetes, you may be "diet" controlled or on oral medications or insulin to control your diabetes. However, you may find that your blood glucose is higher than usual in the hospital whether you have diabetes or not. This is often referred to as "stress hyperglycemia." Stress can elevate your blood glucose. This happens because of hormones put out by the body during times of stress. If stress has been the cause of your high blood glucose, it can be followed regularly by your caregiver. That way he/she can make sure your hyperglycemia does not continue to get worse or progress to  diabetes. °Steroids °· Steroids are medications that act on the infection fighting system (immune system) to block inflammation or infection. One side effect can be a rise in blood glucose. Most people can produce enough extra insulin to allow for this rise, but for those who cannot, steroids make blood glucose levels go even higher. It is not unusual for steroid treatments to "uncover" diabetes that is developing. It is not always possible to determine if the hyperglycemia will go away after the steroids are stopped. A special blood test called an A1c is sometimes done to determine if your blood glucose was elevated before the steroids were started. °SYMPTOMS °· Thirsty. °· Frequent urination. °· Dry mouth. °· Blurred vision. °· Tired or fatigue. °· Weakness. °· Sleepy. °· Tingling in feet or leg. °DIAGNOSIS  °Diagnosis is made by monitoring blood glucose in one or all of the following ways: °· A1c test. This is a chemical found in your blood. °· Fingerstick blood glucose monitoring. °· Laboratory results. °TREATMENT  °First, knowing the cause of the hyperglycemia is important before the hyperglycemia can be treated. Treatment may include, but is not be limited to: °· Education. °· Change or adjustment in medications. °· Change or adjustment in meal plan. °· Treatment for an illness, infection, etc. °· More frequent blood glucose monitoring. °· Change in exercise plan. °· Decreasing or stopping steroids. °· Lifestyle changes. °HOME CARE INSTRUCTIONS  °· Test your blood glucose as directed. °· Exercise regularly. Your caregiver will give you instructions about exercise. Pre-diabetes or diabetes which comes on with stress is helped by exercising. °· Eat wholesome,   balanced meals. Eat often and at regular, fixed times. Your caregiver or nutritionist will give you a meal plan to guide your sugar intake. °· Being at an ideal weight is important. If needed, losing as little as 10 to 15 pounds may help improve blood  glucose levels. °SEEK MEDICAL CARE IF:  °· You have questions about medicine, activity, or diet. °· You continue to have symptoms (problems such as increased thirst, urination, or weight gain). °SEEK IMMEDIATE MEDICAL CARE IF:  °· You are vomiting or have diarrhea. °· Your breath smells fruity. °· You are breathing faster or slower. °· You are very sleepy or incoherent. °· You have numbness, tingling, or pain in your feet or hands. °· You have chest pain. °· Your symptoms get worse even though you have been following your caregiver's orders. °· If you have any other questions or concerns. °  °This information is not intended to replace advice given to you by your health care provider. Make sure you discuss any questions you have with your health care provider. °  °Document Released: 07/09/2000 Document Revised: 04/07/2011 Document Reviewed: 09/19/2014 °[employer]vier Interactive Patient Education ©2016 [employer] ° °

## 2015-07-12 ENCOUNTER — Observation Stay (HOSPITAL_COMMUNITY)
Admission: EM | Admit: 2015-07-12 | Discharge: 2015-07-13 | Disposition: A | Discharge status: Home / Self Care | Payer: BLUE CROSS/BLUE SHIELD | Attending: Family Medicine | Admitting: Family Medicine

## 2015-07-12 ENCOUNTER — Emergency Department (HOSPITAL_COMMUNITY): Payer: BLUE CROSS/BLUE SHIELD

## 2015-07-12 ENCOUNTER — Encounter (HOSPITAL_COMMUNITY): Payer: Self-pay | Admitting: Emergency Medicine

## 2015-07-12 DIAGNOSIS — N182 Chronic kidney disease, stage 2 (mild): Secondary | ICD-10-CM | POA: Diagnosis not present

## 2015-07-12 DIAGNOSIS — E1122 Type 2 diabetes mellitus with diabetic chronic kidney disease: Secondary | ICD-10-CM | POA: Diagnosis not present

## 2015-07-12 DIAGNOSIS — I429 Cardiomyopathy, unspecified: Secondary | ICD-10-CM | POA: Insufficient documentation

## 2015-07-12 DIAGNOSIS — N179 Acute kidney failure, unspecified: Secondary | ICD-10-CM | POA: Diagnosis not present

## 2015-07-12 DIAGNOSIS — N189 Chronic kidney disease, unspecified: Secondary | ICD-10-CM

## 2015-07-12 DIAGNOSIS — R079 Chest pain, unspecified: Secondary | ICD-10-CM | POA: Diagnosis present

## 2015-07-12 DIAGNOSIS — I2510 Atherosclerotic heart disease of native coronary artery without angina pectoris: Secondary | ICD-10-CM | POA: Insufficient documentation

## 2015-07-12 DIAGNOSIS — I5022 Chronic systolic (congestive) heart failure: Secondary | ICD-10-CM | POA: Diagnosis not present

## 2015-07-12 DIAGNOSIS — R739 Hyperglycemia, unspecified: Secondary | ICD-10-CM | POA: Diagnosis present

## 2015-07-12 DIAGNOSIS — R0602 Shortness of breath: Secondary | ICD-10-CM | POA: Diagnosis present

## 2015-07-12 DIAGNOSIS — E1165 Type 2 diabetes mellitus with hyperglycemia: Principal | ICD-10-CM | POA: Insufficient documentation

## 2015-07-12 DIAGNOSIS — E7800 Pure hypercholesterolemia, unspecified: Secondary | ICD-10-CM | POA: Diagnosis not present

## 2015-07-12 DIAGNOSIS — Z7984 Long term (current) use of oral hypoglycemic drugs: Secondary | ICD-10-CM | POA: Insufficient documentation

## 2015-07-12 DIAGNOSIS — I130 Hypertensive heart and chronic kidney disease with heart failure and stage 1 through stage 4 chronic kidney disease, or unspecified chronic kidney disease: Secondary | ICD-10-CM | POA: Insufficient documentation

## 2015-07-12 DIAGNOSIS — R778 Other specified abnormalities of plasma proteins: Secondary | ICD-10-CM | POA: Insufficient documentation

## 2015-07-12 DIAGNOSIS — M479 Spondylosis, unspecified: Secondary | ICD-10-CM | POA: Insufficient documentation

## 2015-07-12 DIAGNOSIS — Z79899 Other long term (current) drug therapy: Secondary | ICD-10-CM | POA: Insufficient documentation

## 2015-07-12 DIAGNOSIS — R7989 Other specified abnormal findings of blood chemistry: Secondary | ICD-10-CM

## 2015-07-12 LAB — COMPREHENSIVE METABOLIC PANEL
ALT: 18 U/L (ref 17–63)
AST: 29 U/L (ref 15–41)
Albumin: 3.4 g/dL — ABNORMAL LOW (ref 3.5–5.0)
Alkaline Phosphatase: 90 U/L (ref 38–126)
Anion gap: 9 (ref 5–15)
BUN: 24 mg/dL — ABNORMAL HIGH (ref 6–20)
CO2: 21 mmol/L — ABNORMAL LOW (ref 22–32)
Calcium: 8.9 mg/dL (ref 8.9–10.3)
Chloride: 95 mmol/L — ABNORMAL LOW (ref 101–111)
Creatinine, Ser: 2.08 mg/dL — ABNORMAL HIGH (ref 0.61–1.24)
GFR calc Af Amer: 38 mL/min — ABNORMAL LOW (ref >60)
GFR calc non Af Amer: 33 mL/min — ABNORMAL LOW (ref >60)
Glucose, Bld: 778 mg/dL (ref 65–99)
Potassium: 3.8 mmol/L (ref 3.5–5.1)
Sodium: 125 mmol/L — ABNORMAL LOW (ref 135–145)
Total Bilirubin: 1.1 mg/dL (ref 0.3–1.2)
Total Protein: 6.8 g/dL (ref 6.5–8.1)

## 2015-07-12 LAB — CBC WITH DIFFERENTIAL/PLATELET
Basophils Absolute: 0.1 10*3/uL (ref 0.0–0.1)
Basophils Relative: 1 %
Eosinophils Absolute: 0.1 10*3/uL (ref 0.0–0.7)
Eosinophils Relative: 1 %
HCT: 35.9 % — ABNORMAL LOW (ref 39.0–52.0)
Hemoglobin: 12.2 g/dL — ABNORMAL LOW (ref 13.0–17.0)
Lymphocytes Relative: 30 %
Lymphs Abs: 2.3 10*3/uL (ref 0.7–4.0)
MCH: 29.6 pg (ref 26.0–34.0)
MCHC: 34 g/dL (ref 30.0–36.0)
MCV: 87.1 fL (ref 78.0–100.0)
Monocytes Absolute: 0.6 10*3/uL (ref 0.1–1.0)
Monocytes Relative: 7 %
Neutro Abs: 4.8 10*3/uL (ref 1.7–7.7)
Neutrophils Relative %: 61 %
Platelets: 295 10*3/uL (ref 150–400)
RBC: 4.12 MIL/uL — ABNORMAL LOW (ref 4.22–5.81)
RDW: 13 % (ref 11.5–15.5)
WBC: 7.8 10*3/uL (ref 4.0–10.5)

## 2015-07-12 LAB — URINALYSIS, ROUTINE W REFLEX MICROSCOPIC
Bilirubin Urine: NEGATIVE
Glucose, UA: >1000 mg/dL — AB
Hgb urine dipstick: NEGATIVE
Ketones, ur: NEGATIVE mg/dL
Leukocytes, UA: NEGATIVE
Nitrite: NEGATIVE
Protein, ur: NEGATIVE mg/dL
Specific Gravity, Urine: 1.029 (ref 1.005–1.030)
pH: 5 (ref 5.0–8.0)

## 2015-07-12 LAB — BASIC METABOLIC PANEL
Anion gap: 9 (ref 5–15)
BUN: 21 mg/dL — ABNORMAL HIGH (ref 6–20)
CO2: 23 mmol/L (ref 22–32)
Calcium: 9 mg/dL (ref 8.9–10.3)
Chloride: 103 mmol/L (ref 101–111)
Creatinine, Ser: 1.53 mg/dL — ABNORMAL HIGH (ref 0.61–1.24)
GFR calc Af Amer: 55 mL/min — ABNORMAL LOW (ref >60)
GFR calc non Af Amer: 47 mL/min — ABNORMAL LOW (ref >60)
Glucose, Bld: 273 mg/dL — ABNORMAL HIGH (ref 65–99)
Potassium: 3.2 mmol/L — ABNORMAL LOW (ref 3.5–5.1)
Sodium: 135 mmol/L (ref 135–145)

## 2015-07-12 LAB — URINE MICROSCOPIC-ADD ON

## 2015-07-12 LAB — TROPONIN I
Troponin I: 0.04 ng/mL — ABNORMAL HIGH (ref <0.031)
Troponin I: 0.04 ng/mL — ABNORMAL HIGH (ref <0.031)

## 2015-07-12 LAB — CBG MONITORING, ED
Glucose-Capillary: >600 mg/dL (ref 65–99)
Glucose-Capillary: 369 mg/dL — ABNORMAL HIGH (ref 65–99)

## 2015-07-12 LAB — BRAIN NATRIURETIC PEPTIDE: B Natriuretic Peptide: 17.8 pg/mL (ref 0.0–100.0)

## 2015-07-12 LAB — MRSA PCR SCREENING: MRSA by PCR: POSITIVE — AB

## 2015-07-12 LAB — POC OCCULT BLOOD, ED: Fecal Occult Bld: NEGATIVE

## 2015-07-12 MED ORDER — SODIUM CHLORIDE 0.9 % IV SOLN
INTRAVENOUS | Status: DC
  Administered 2015-07-12: 5.4 [IU]/h via INTRAVENOUS
  Filled 2015-07-12: qty 2.5

## 2015-07-12 MED ORDER — ALBUTEROL SULFATE (2.5 MG/3ML) 0.083% IN NEBU: 5.0000 mg | INHALATION_SOLUTION | Freq: Once | RESPIRATORY_TRACT | Status: DC

## 2015-07-12 MED ORDER — DEXTROSE-NACL 5-0.45 % IV SOLN: INTRAVENOUS | Status: DC

## 2015-07-12 MED ORDER — INSULIN REGULAR BOLUS VIA INFUSION
0.0000 [IU] | Freq: Three times a day (TID) | INTRAVENOUS | Status: DC
  Filled 2015-07-12: qty 10

## 2015-07-12 MED ORDER — IPRATROPIUM-ALBUTEROL 0.5-2.5 (3) MG/3ML IN SOLN
3.0000 mL | Freq: Once | RESPIRATORY_TRACT | Status: AC
  Administered 2015-07-12: 3 mL via RESPIRATORY_TRACT
  Filled 2015-07-12: qty 3

## 2015-07-12 MED ORDER — ONDANSETRON HCL 4 MG PO TABS: 4.0000 mg | ORAL_TABLET | Freq: Four times a day (QID) | ORAL | Status: DC | PRN

## 2015-07-12 MED ORDER — DEXTROSE 50 % IV SOLN: 25.0000 mL | INTRAVENOUS | Status: DC | PRN

## 2015-07-12 MED ORDER — SODIUM CHLORIDE 0.9 % IV SOLN: INTRAVENOUS | Status: DC

## 2015-07-12 MED ORDER — ATORVASTATIN CALCIUM 10 MG PO TABS
20.0000 mg | ORAL_TABLET | Freq: Every day | ORAL | Status: DC
  Administered 2015-07-13: 20 mg via ORAL
  Filled 2015-07-12: qty 2

## 2015-07-12 MED ORDER — SODIUM CHLORIDE 0.9 % IV BOLUS (SEPSIS)
1000.0000 mL | Freq: Once | INTRAVENOUS | Status: AC
  Administered 2015-07-12: 1000 mL via INTRAVENOUS

## 2015-07-12 MED ORDER — ISOSORBIDE MONONITRATE ER 60 MG PO TB24
60.0000 mg | ORAL_TABLET | Freq: Every day | ORAL | Status: DC
  Administered 2015-07-13: 60 mg via ORAL
  Filled 2015-07-12: qty 1

## 2015-07-12 MED ORDER — HYDRALAZINE HCL 50 MG PO TABS
50.0000 mg | ORAL_TABLET | Freq: Three times a day (TID) | ORAL | Status: DC
  Administered 2015-07-13: 50 mg via ORAL
  Filled 2015-07-12: qty 1

## 2015-07-12 MED ORDER — INSULIN ASPART 100 UNIT/ML ~~LOC~~ SOLN
10.0000 [IU] | Freq: Once | SUBCUTANEOUS | Status: AC
  Administered 2015-07-12: 10 [IU] via INTRAVENOUS
  Filled 2015-07-12: qty 1

## 2015-07-12 MED ORDER — ASPIRIN EC 325 MG PO TBEC
325.0000 mg | DELAYED_RELEASE_TABLET | Freq: Once | ORAL | Status: AC
  Administered 2015-07-12: 325 mg via ORAL
  Filled 2015-07-12: qty 1

## 2015-07-12 MED ORDER — ENOXAPARIN SODIUM 40 MG/0.4ML ~~LOC~~ SOLN
40.0000 mg | SUBCUTANEOUS | Status: DC
  Administered 2015-07-12: 40 mg via SUBCUTANEOUS
  Filled 2015-07-12: qty 0.4

## 2015-07-12 MED ORDER — FUROSEMIDE 10 MG/ML IJ SOLN
40.0000 mg | Freq: Once | INTRAMUSCULAR | Status: AC
Start: 2015-07-12 — End: 2015-07-12
  Administered 2015-07-12: 40 mg via INTRAVENOUS
  Filled 2015-07-12: qty 4

## 2015-07-12 MED ORDER — CARVEDILOL 12.5 MG PO TABS
12.5000 mg | ORAL_TABLET | Freq: Every day | ORAL | Status: DC
  Administered 2015-07-13: 12.5 mg via ORAL
  Filled 2015-07-12: qty 1

## 2015-07-12 MED ORDER — ONDANSETRON HCL 4 MG/2ML IJ SOLN: 4.0000 mg | Freq: Four times a day (QID) | INTRAMUSCULAR | Status: DC | PRN

## 2015-07-12 NOTE — H&P (Signed)
TRH H&P   Patient Demographics:    [name], is a 60 y.o. male  MRN: [id number]   DOB - 1955/02/19  Admit Date - 07/12/2015  Outpatient Primary MD for the patient is No PCP Per Patient  Referring MD/NP/PA: Dr Gilford Raid  Patient coming from: Home  Chief Complaint  Patient presents with  . Shortness of Breath  . Palpitations      HPI:    [name]  is a 60 y.o. male, With a history of diabetes mellitus, nonischemic cardio myopathy, CHF who came to the hospital with chief complaint of generalized weakness, dizziness, blurred vision. Patient then out of his medications for about 3 weeks and was seen in the ED 2 days ago at that time he was sent home with prescriptions of medications. Patient said that he has been taking them but did not feel good and started having weakness, blurred vision, dizziness. He also developed shortness of breath but denies any chest pain. In the ED patient was found to be in hyperglycemia, with blood glucose more than 700. Also had mild initial troponin. Patient started on IV insulin He denies nausea vomiting or diarrhea. No fever or dysuria.    Review of systems:    In addition to the HPI above,    No Abdominal pain, No Nausea or Vommitting, Bowel movements are regular, No Blood in stool or Urine, No dysuria, No new skin rashes or bruises, No new joints pains-aches,  No new weakness, tingling, numbness in any extremity, No recent weight gain or loss, No polyuria, polydypsia or polyphagia, No significant Mental Stressors.  A full 10 point Review of Systems was done, except as stated above, all other Review of Systems were negative.   With Past History of the following :    Past Medical History  Diagnosis Date  . CAD     nonobst cath 2004  . CARDIOMYOPATHY     NICM, LVEF 30% 01/2011 echo; 20-25% 07/2012 echo  . DIABETES MELLITUS, TYPE II   .  HYPERCHOLESTEROLEMIA   . HYPERTENSION   . PSA, INCREASED   . Noncompliance   . CHF (congestive heart failure) ([place])   . PVCs (premature ventricular contractions)     s/p ablation 01-07-2013 by Dr Rayann Heman  . Asthma     years ago   . Arthritis     lower back   . Cancer [facility])     prostate cancer      Past Surgical History  Procedure Laterality Date  . Cardiac catheterization  2004    nonobst dz  . Surgery scrotal / testicular      at age 35  . Ablation  01-07-2013    RVOT PVC's ablated by Dr Rayann Heman along anteroseptal RVOT  . Cholecystectomy N/A 11/16/2013    Procedure: LAPAROSCOPIC CHOLECYSTECTOMY WITH INTRAOPERATIVE CHOLANGIOGRAM;  Surgeon: [name], MD;  Location: [facility];  Service: General;  Laterality: N/A;  . Supraventricular tachycardia ablation N/A 09/28/2012    Procedure: Tanna Furry Ablation;  Surgeon: Jeneen Rinks  Allred, MD;  Location: [place] CATH LAB;  Service: Cardiovascular;  Laterality: N/A;  . V-tach ablation N/A 01/07/2013    Procedure: V-TACH ABLATION;  Surgeon: [name], MD;  Location: [place] CATH LAB;  Service: Cardiovascular;  Laterality: N/A;  . Robot assisted laparoscopic radical prostatectomy N/A 02/22/2014    Procedure: ROBOTIC ASSISTED LAPAROSCOPIC RADICAL PROSTATECTOMY WITH INDOCYANINE GREEN DYE AND OPEN UMBILICAL HERNIA REPAIR;  Surgeon: [name], MD;  Location: [facility];  Service: Urology;  Laterality: N/A;  . Lymphadenectomy Bilateral 02/22/2014    Procedure: PELVIC LYMPH NODE DISSECTION;  Surgeon: [name], MD;  Location: [facility];  Service: Urology;  Laterality: Bilateral;      Social History:     Social History  Substance Use Topics  . Smoking status: Never Smoker   . 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 Alcohol Use: No     Comment: rare       Family History :     Family History  Problem Relation Age of Onset  . Peripheral vascular disease    . Hypertension    . Prostate  cancer    . Hypertension Mother   . Peripheral vascular disease Mother   . Prostate cancer Brother       Home Medications:   Prior to Admission medications   Medication Sig Start Date End Date Taking? Authorizing Provider  Aspirin-Acetaminophen-Caffeine (GOODY HEADACHE PO) Take 1 packet by mouth daily as needed (Pain).   Yes Historical Provider, MD  atorvastatin (LIPITOR) 20 MG tablet Take 20 mg by mouth daily.   Yes Historical Provider, MD  carvedilol (COREG) 12.5 MG tablet Take 1 tablet (12.5 mg total) by mouth daily. 07/10/15  Yes Lacretia Leigh, MD  glimepiride (AMARYL) 2 MG tablet Take 1 tablet (2 mg total) by mouth daily. 07/10/15  Yes Lacretia Leigh, MD  hydrALAZINE (APRESOLINE) 50 MG tablet Take 1 tablet (50 mg total) by mouth 3 (three) times daily. 07/10/15  Yes Lacretia Leigh, MD  hydrochlorothiazide (HYDRODIURIL) 25 MG tablet Take 1 tablet (25 mg total) by mouth daily. 07/10/15  Yes Lacretia Leigh, MD  isosorbide mononitrate (IMDUR) 60 MG 24 hr tablet Take 1 tablet (60 mg total) by mouth daily. 07/10/15  Yes Lacretia Leigh, MD  lisinopril (PRINIVIL,ZESTRIL) 20 MG tablet Take 1 tablet (20 mg total) by mouth 2 (two) times daily. 07/10/15  Yes Lacretia Leigh, MD     Allergies:     Allergies  Allergen Reactions  . Ambien [Zolpidem Tartrate] Other (See Comments)    "Went berserk."     Physical Exam:   Vitals  Blood pressure 108/56, pulse 97, temperature 98.1 F (36.7 C), temperature source Oral, resp. rate 22, SpO2 99 %.   1. General Middle-aged African-American male* lying in bed in NAD, cooperative with exam  2. Normal affect and insight, Not Suicidal or Homicidal, Awake Alert, Oriented X 3.  3. No F.N deficits, ALL C.Nerves Intact, Strength 5/5 all 4 extremities, Sensation intact all 4 extremities, Plantars down going.  4. Ears and Eyes appear Normal, Conjunctivae clear, PERRLA. Moist Oral Mucosa.  5. Supple Neck, No JVD, No cervical lymphadenopathy appriciated, No Carotid  Bruits.  6. Symmetrical Chest wall movement, Good air movement bilaterally, CTAB.  7. RRR, No Gallops, Rubs or Murmurs, No Parasternal Heave.  8. Positive Bowel Sounds, Abdomen Soft, No tenderness, No organomegaly appriciated,No rebound -guarding or rigidity.  9.  No Cyanosis, Normal Skin  Turgor, No Skin Rash or Bruise.  10. Good muscle tone,  joints appear normal , no effusions, Normal ROM.      Data Review:    CBC  Recent Labs Lab 07/10/15 1034 07/12/15 1621  WBC 6.9 7.8  HGB 14.7 12.2*  HCT 40.8 35.9*  PLT 192 295  MCV 83.6 87.1  MCH 30.1 29.6  MCHC 36.0 34.0  RDW 12.6 13.0  LYMPHSABS  --  2.3  MONOABS  --  0.6  EOSABS  --  0.1  BASOSABS  --  0.1   ------------------------------------------------------------------------------------------------------------------  Chemistries   Recent Labs Lab 07/10/15 1034 07/12/15 1621  NA 131* 125*  K 4.8 3.8  CL 97* 95*  CO2 21* 21*  GLUCOSE 511* 778*  BUN 17 24*  CREATININE 1.34* 2.08*  CALCIUM 9.7 8.9  AST  --  29  ALT  --  18  ALKPHOS  --  90  BILITOT  --  1.1   ------------------------------------------------------------------------------------------------------------------ CrCl cannot be calculated (Unknown ideal weight.). ------------------------------------------------------------------------------------------------------------------ No results for input(s): TSH, T4TOTAL, T3FREE, THYROIDAB in the last 72 hours.  Invalid input(s): FREET3  Coagulation profile No results for input(s): INR, PROTIME in the last 168 hours. ------------------------------------------------------------------------------------------------------------------- No results for input(s): DDIMER in the last 72 hours. -------------------------------------------------------------------------------------------------------------------  Cardiac Enzymes  Recent Labs Lab 07/12/15 1621  TROPONINI 0.04*    ------------------------------------------------------------------------------------------------------------------    Component Value Date/Time   BNP 17.8 07/12/2015 1621     ---------------------------------------------------------------------------------------------------------------  Urinalysis    Component Value Date/Time   COLORURINE YELLOW 07/12/2015 1640   APPEARANCEUR CLEAR 07/12/2015 1640   LABSPEC 1.029 07/12/2015 1640   PHURINE 5.0 07/12/2015 1640   GLUCOSEU >1000* 07/12/2015 1640   HGBUR NEGATIVE 07/12/2015 1640   BILIRUBINUR NEGATIVE 07/12/2015 1640   KETONESUR NEGATIVE 07/12/2015 1640   PROTEINUR NEGATIVE 07/12/2015 1640   UROBILINOGEN 0.2 02/12/2011 0804   NITRITE NEGATIVE 07/12/2015 1640   LEUKOCYTESUR NEGATIVE 07/12/2015 1640    ----------------------------------------------------------------------------------------------------------------   Imaging Results:    Dg Chest 2 View  07/12/2015  CLINICAL DATA:  History of diabetes, CHF, prostate cancer. Increased shortness of breath and heart palpitations starting today. EXAM: CHEST  2 VIEW COMPARISON:  Chest x-ray dated 02/16/2014. FINDINGS: Heart size is normal. Overall cardiomediastinal silhouette is stable in size and configuration. Lungs are clear. No pleural effusion no pneumothorax seen. Osseous and soft tissue structures about the chest are unremarkable. IMPRESSION: Stable chest x-ray.  No active cardiopulmonary disease. Electronically Signed   By: Franki Cabot M.D.   On: 07/12/2015 15:57    My personal review of EKG: Rhythm NSR, Rate  97** /min, QTc *466 , no acute ST changes   Assessment & Plan:    Active Problems:   Chronic systolic heart failure (HCC)   CKD (chronic kidney disease) stage 2, GFR 60-89 ml/min   Chest pain   Hyperglycemia     1. Hyperglycemia- patient presenting with blood glucose greater than 700, AG 9. Will admit the patient for observation and continue IV insulin. Will switch  IV insulin to Lantus and sliding scale insulin when blood glucose is less than 250. 2. Dyspnea- patient has a history of CHF, chest x-ray shows no pulmonary edema. Has mild elevation of troponin 0.04, will obtain serial troponin every 6 hours 3. 3. Diabetes mellitus-hold oral hypoglycemic agents, as patient on IV insulin. Follow hemoglobin A1c and adjust outpatient medications accordingly.  4. Acute on chronic kidney disease stage III- patient presenting with worsening of renal function with creatinine  2.08, baseline creatinine 1.28. Continue gentle IV hydration with normal saline. Follow BMP in a.m. 5. Hypertension- continue Coreg, will hold HCTZ and ACE inhibitor at this time due to worsening of renal function. 6. Nonischemic cardio myopathy/chronic systolic heart failure- stable, patient is on IV fluids. We will need to carefully monitor the patient's intake and output.   DVT Prophylaxis-   Lovenox   AM Labs Ordered, also please review Full Orders  Family Communication: Admission, patients condition and plan of care including tests being ordered have been discussed with the patient and His children at bedside who indicate understanding and agree with the plan and Code Status.  Code Status:  Full code  Admission status: Observation  Time spent in minutes : 50 min   Tamiah Dysart S M.D on 07/12/2015 at 6:06 PM  Between 7am to 7pm - Pager - [phone]. After 7pm go to [email or web address] - password [facility]  [facility] - Office  [phone]

## 2015-07-12 NOTE — ED Notes (Signed)
EKG completed at triage.

## 2015-07-12 NOTE — ED Notes (Signed)
Primary RN made aware of glucose.

## 2015-07-12 NOTE — ED Notes (Addendum)
Patient has had increased shortness of breath and heart palpitations starting today. Lung sounds clear. Patient was seen here 2 days ago.

## 2015-07-12 NOTE — ED Notes (Signed)
Pt can go to floor at 18:25 and nurse will call.

## 2015-07-12 NOTE — ED Provider Notes (Signed)
CSN: [id number]     Arrival date & time 07/12/15  1536 History   First MD Initiated Contact with Patient 07/12/15 1559     Chief Complaint  Patient presents with  . Shortness of Breath  . Palpitations  PT IS A 60 YO BM WITH HX OF CARDIOMYOPATHY AND CHF AND DM 2 WHO PRESENTS HERE WITH SOB.  THE PT SAID THAT HE WAS HERE 2 DAYS AGO FOR HYPERGLYCEMIA.  HE RECEIVED IVFS AND IV INSULIN.  PT HAD BEEN OFF ALL OF HIS MEDS FOR ABOUT 3 WEEKS UNTIL 2 DAYS AGO.  HE WAS GIVEN RX FOR ALL OF HIS MEDS.  HE SAID THAT HE'S BEEN TAKING THEM.  HE ALSO SAID THAT HE'S BEEN HAVING PALPITATIONS AND FEELS UNCOMFORTABLE IN HIS CHEST AT TIMES.  NONE NOW.   (Consider location/radiation/quality/duration/timing/severity/associated sxs/prior Treatment) Patient is a 60 y.o. male presenting with shortness of breath and palpitations. The history is provided by the patient.  Shortness of Breath Severity:  Moderate Onset quality:  Gradual Timing:  Constant Progression:  Worsening Chronicity:  New Relieved by:  Nothing Worsened by:  Exertion Ineffective treatments:  None tried Palpitations Associated symptoms: shortness of breath     Past Medical History  Diagnosis Date  . CAD     nonobst cath 2004  . CARDIOMYOPATHY     NICM, LVEF 30% 01/2011 echo; 20-25% 07/2012 echo  . DIABETES MELLITUS, TYPE II   . HYPERCHOLESTEROLEMIA   . HYPERTENSION   . PSA, INCREASED   . Noncompliance   . CHF (congestive heart failure) ([place])   . PVCs (premature ventricular contractions)     s/p ablation 01-07-2013 by Dr Rayann Heman  . Asthma     years ago   . Arthritis     lower back   . Cancer [facility])     prostate cancer   Past Surgical History  Procedure Laterality Date  . Cardiac catheterization  2004    nonobst dz  . Surgery scrotal / testicular      at age 24  . Ablation  01-07-2013    RVOT PVC's ablated by Dr Rayann Heman along anteroseptal RVOT  . Cholecystectomy N/A 11/16/2013    Procedure: LAPAROSCOPIC CHOLECYSTECTOMY WITH  INTRAOPERATIVE CHOLANGIOGRAM;  Surgeon: [name], MD;  [facility]: [facility];  Service: General;  Laterality: N/A;  . Supraventricular tachycardia ablation N/A 09/28/2012    Procedure: Tanna Furry Ablation;  Surgeon: [name], MD;  Location: [facility] CATH LAB;  Service: Cardiovascular;  Laterality: N/A;  . V-tach ablation N/A 01/07/2013    Procedure: V-TACH ABLATION;  Surgeon: [name], MD;  Location: [place] CATH LAB;  Service: Cardiovascular;  Laterality: N/A;  . Robot assisted laparoscopic radical prostatectomy N/A 02/22/2014    Procedure: ROBOTIC ASSISTED LAPAROSCOPIC RADICAL PROSTATECTOMY WITH INDOCYANINE GREEN DYE AND OPEN UMBILICAL HERNIA REPAIR;  Surgeon: [name], MD;  [facility];  Service: Urology;  Laterality: N/A;  . Lymphadenectomy Bilateral 02/22/2014    Procedure: PELVIC LYMPH NODE DISSECTION;  Surgeon: [name], MD;  [facility];  Service: Urology;  Laterality: Bilateral;   Family History  Problem Relation Age of Onset  . Peripheral vascular disease    . Hypertension    . Prostate cancer    . Hypertension Mother   . Peripheral vascular disease Mother   . Prostate cancer Brother    Social History  Substance Use Topics  . Smoking status: Never Smoker   . 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 Alcohol Use: No     Comment: rare    Review of Systems  Respiratory: Positive for shortness of breath.   Cardiovascular: Positive for palpitations.  All other systems reviewed and are negative.     Allergies  Ambien  Home Medications   Prior to Admission medications   Medication Sig Start Date End Date Taking? Authorizing Provider  Aspirin-Acetaminophen-Caffeine (GOODY HEADACHE PO) Take 1 packet by mouth daily as needed (Pain).   Yes Historical Provider, MD  atorvastatin (LIPITOR) 20 MG tablet Take 20 mg by mouth daily.   Yes Historical Provider, MD  carvedilol (COREG) 12.5 MG tablet Take 1  tablet (12.5 mg total) by mouth daily. 07/10/15  Yes Lacretia Leigh, MD  glimepiride (AMARYL) 2 MG tablet Take 1 tablet (2 mg total) by mouth daily. 07/10/15  Yes Lacretia Leigh, MD  hydrALAZINE (APRESOLINE) 50 MG tablet Take 1 tablet (50 mg total) by mouth 3 (three) times daily. 07/10/15  Yes Lacretia Leigh, MD  hydrochlorothiazide (HYDRODIURIL) 25 MG tablet Take 1 tablet (25 mg total) by mouth daily. 07/10/15  Yes Lacretia Leigh, MD  isosorbide mononitrate (IMDUR) 60 MG 24 hr tablet Take 1 tablet (60 mg total) by mouth daily. 07/10/15  Yes Lacretia Leigh, MD  lisinopril (PRINIVIL,ZESTRIL) 20 MG tablet Take 1 tablet (20 mg total) by mouth 2 (two) times daily. 07/10/15  Yes Lacretia Leigh, MD   BP 108/56 mmHg  Pulse 97  Temp(Src) 98.1 F (36.7 C) (Oral)  Resp 22  SpO2 99% Physical Exam  Constitutional: He is oriented to person, place, and time. He appears well-developed and well-nourished.  HENT:  Head: Normocephalic and atraumatic.  Right Ear: External ear normal.  Left Ear: External ear normal.  Nose: Nose normal.  Mouth/Throat: Oropharynx is clear and moist.  Eyes: Conjunctivae and EOM are normal. Pupils are equal, round, and reactive to light.  Neck: Normal range of motion. Neck supple.  Cardiovascular: Normal rate, regular rhythm, normal heart sounds and intact distal pulses.   Pulmonary/Chest: He has rales.  Abdominal: Soft. Bowel sounds are normal.  Musculoskeletal: Normal range of motion.  Neurological: He is alert and oriented to person, place, and time.  Skin: Skin is warm and dry.  Psychiatric: He has a normal mood and affect. His behavior is normal. Judgment and thought content normal.  Nursing note and vitals reviewed.   ED Course  Procedures (including critical care time) Labs Review Labs Reviewed  COMPREHENSIVE METABOLIC PANEL - Abnormal; Notable for the following:    Sodium 125 (*)    Chloride 95 (*)    CO2 21 (*)    Glucose, Bld 778 (*)    BUN 24 (*)    Creatinine, Ser  2.08 (*)    Albumin 3.4 (*)    GFR calc non Af Amer 33 (*)    GFR calc Af Amer 38 (*)    All other components within normal limits  CBC WITH DIFFERENTIAL/PLATELET - Abnormal; Notable for the following:    RBC 4.12 (*)    Hemoglobin 12.2 (*)    HCT 35.9 (*)    All other components within normal limits  TROPONIN I - Abnormal; Notable for the following:    Troponin I 0.04 (*)    All other components within normal limits  URINALYSIS, ROUTINE W REFLEX MICROSCOPIC (NOT AT [facility]) - Abnormal; Notable for the following:    Glucose, UA >1000 (*)    All other components within normal  limits  URINE MICROSCOPIC-ADD ON - Abnormal; Notable for the following:    Squamous Epithelial / LPF 0-5 (*)    Bacteria, UA RARE (*)    All other components within normal limits  BRAIN NATRIURETIC PEPTIDE  CBG MONITORING, ED  POC OCCULT BLOOD, ED    Imaging Review Dg Chest 2 View  07/12/2015  CLINICAL DATA:  History of diabetes, CHF, prostate cancer. Increased shortness of breath and heart palpitations starting today. EXAM: CHEST  2 VIEW COMPARISON:  Chest x-ray dated 02/16/2014. FINDINGS: Heart size is normal. Overall cardiomediastinal silhouette is stable in size and configuration. Lungs are clear. No pleural effusion no pneumothorax seen. Osseous and soft tissue structures about the chest are unremarkable. IMPRESSION: Stable chest x-ray.  No active cardiopulmonary disease. Electronically Signed   By: Franki Cabot M.D.   On: 07/12/2015 15:57   I have personally reviewed and evaluated these images and lab results as part of my medical decision-making.   EKG Interpretation   Date/Time:  Thursday July 12 2015 15:42:03 EDT Ventricular Rate:  97 PR Interval:  178 QRS Duration: 120 QT Interval:  367 QTC Calculation: 466 R Axis:   -61 Text Interpretation:  Sinus rhythm LVH with IVCD, LAD and secondary repol  abnrm No significant change since last tracing Confirmed by Ashok Cordia  MD,  Lennette Bihari ([id number]) on 07/12/2015  3:52:22 PM      MDM  PT D/W DR. LAMA ([facility]) WHO WILL ADMIT PT FOR OBS. Final diagnoses:  Acute on chronic renal failure ([place])  Poorly controlled type 2 diabetes mellitus ([place])  Elevated troponin      Isla Pence, MD 07/12/15 1746

## 2015-07-12 NOTE — ED Notes (Signed)
Patient transported to X-ray 

## 2015-07-13 DIAGNOSIS — I5022 Chronic systolic (congestive) heart failure: Secondary | ICD-10-CM | POA: Diagnosis not present

## 2015-07-13 DIAGNOSIS — R739 Hyperglycemia, unspecified: Secondary | ICD-10-CM | POA: Diagnosis not present

## 2015-07-13 DIAGNOSIS — N182 Chronic kidney disease, stage 2 (mild): Secondary | ICD-10-CM | POA: Diagnosis not present

## 2015-07-13 LAB — GLUCOSE, CAPILLARY
Glucose-Capillary: 161 mg/dL — ABNORMAL HIGH (ref 65–99)
Glucose-Capillary: 175 mg/dL — ABNORMAL HIGH (ref 65–99)
Glucose-Capillary: 206 mg/dL — ABNORMAL HIGH (ref 65–99)
Glucose-Capillary: 262 mg/dL — ABNORMAL HIGH (ref 65–99)
Glucose-Capillary: 271 mg/dL — ABNORMAL HIGH (ref 65–99)
Glucose-Capillary: 194 mg/dL — ABNORMAL HIGH (ref 65–99)
Glucose-Capillary: 227 mg/dL — ABNORMAL HIGH (ref 65–99)
Glucose-Capillary: 318 mg/dL — ABNORMAL HIGH (ref 65–99)

## 2015-07-13 LAB — COMPREHENSIVE METABOLIC PANEL
ALT: 17 U/L (ref 17–63)
AST: 18 U/L (ref 15–41)
Albumin: 3.3 g/dL — ABNORMAL LOW (ref 3.5–5.0)
Alkaline Phosphatase: 74 U/L (ref 38–126)
Anion gap: 7 (ref 5–15)
BUN: 18 mg/dL (ref 6–20)
CO2: 24 mmol/L (ref 22–32)
Calcium: 8.7 mg/dL — ABNORMAL LOW (ref 8.9–10.3)
Chloride: 105 mmol/L (ref 101–111)
Creatinine, Ser: 1.16 mg/dL (ref 0.61–1.24)
GFR calc Af Amer: >60 mL/min (ref >60)
GFR calc non Af Amer: >60 mL/min (ref >60)
Glucose, Bld: 197 mg/dL — ABNORMAL HIGH (ref 65–99)
Potassium: 2.5 mmol/L — CL (ref 3.5–5.1)
Sodium: 136 mmol/L (ref 135–145)
Total Bilirubin: 1.1 mg/dL (ref 0.3–1.2)
Total Protein: 6.6 g/dL (ref 6.5–8.1)

## 2015-07-13 LAB — BASIC METABOLIC PANEL
Anion gap: 6 (ref 5–15)
BUN: 16 mg/dL (ref 6–20)
CO2: 25 mmol/L (ref 22–32)
Calcium: 8.6 mg/dL — ABNORMAL LOW (ref 8.9–10.3)
Chloride: 105 mmol/L (ref 101–111)
Creatinine, Ser: 1.18 mg/dL (ref 0.61–1.24)
GFR calc Af Amer: >60 mL/min (ref >60)
GFR calc non Af Amer: >60 mL/min (ref >60)
Glucose, Bld: 220 mg/dL — ABNORMAL HIGH (ref 65–99)
Potassium: 3.6 mmol/L (ref 3.5–5.1)
Sodium: 136 mmol/L (ref 135–145)

## 2015-07-13 LAB — TROPONIN I
Troponin I: 0.04 ng/mL — ABNORMAL HIGH (ref <0.031)
Troponin I: 0.04 ng/mL — ABNORMAL HIGH (ref <0.031)

## 2015-07-13 LAB — CBC
HCT: 33.4 % — ABNORMAL LOW (ref 39.0–52.0)
Hemoglobin: 11.8 g/dL — ABNORMAL LOW (ref 13.0–17.0)
MCH: 29.4 pg (ref 26.0–34.0)
MCHC: 35.3 g/dL (ref 30.0–36.0)
MCV: 83.3 fL (ref 78.0–100.0)
Platelets: 259 10*3/uL (ref 150–400)
RBC: 4.01 MIL/uL — ABNORMAL LOW (ref 4.22–5.81)
RDW: 12.7 % (ref 11.5–15.5)
WBC: 7.2 10*3/uL (ref 4.0–10.5)

## 2015-07-13 MED ORDER — INSULIN GLARGINE 100 UNIT/ML ~~LOC~~ SOLN
10.0000 [IU] | Freq: Every day | SUBCUTANEOUS | Status: DC
  Administered 2015-07-13: 10 [IU] via SUBCUTANEOUS
  Filled 2015-07-13 (×2): qty 0.1

## 2015-07-13 MED ORDER — BLOOD GLUCOSE METER KIT: PACK | Status: DC

## 2015-07-13 MED ORDER — METFORMIN HCL 500 MG PO TABS: 500.0000 mg | ORAL_TABLET | Freq: Two times a day (BID) | ORAL | Status: DC

## 2015-07-13 MED ORDER — CHLORHEXIDINE GLUCONATE CLOTH 2 % EX PADS: 6.0000 | MEDICATED_PAD | Freq: Every day | CUTANEOUS | Status: DC

## 2015-07-13 MED ORDER — INSULIN GLARGINE 100 UNIT/ML SOLOSTAR PEN: 10.0000 [IU] | PEN_INJECTOR | Freq: Every day | SUBCUTANEOUS | Status: DC

## 2015-07-13 MED ORDER — INSULIN ASPART 100 UNIT/ML ~~LOC~~ SOLN: 0.0000 [IU] | Freq: Every day | SUBCUTANEOUS | Status: DC

## 2015-07-13 MED ORDER — INSULIN ASPART 100 UNIT/ML ~~LOC~~ SOLN
0.0000 [IU] | Freq: Three times a day (TID) | SUBCUTANEOUS | Status: DC
  Administered 2015-07-13: 11 [IU] via SUBCUTANEOUS
  Administered 2015-07-13: 5 [IU] via SUBCUTANEOUS

## 2015-07-13 MED ORDER — INSULIN STARTER KIT- PEN NEEDLES (ENGLISH)
1.0000 | Freq: Once | Status: AC
  Administered 2015-07-13: 1
  Filled 2015-07-13: qty 1

## 2015-07-13 MED ORDER — MUPIROCIN 2 % EX OINT
1.0000 "application " | TOPICAL_OINTMENT | Freq: Two times a day (BID) | CUTANEOUS | Status: DC
  Administered 2015-07-13: 1 via NASAL
  Filled 2015-07-13: qty 22

## 2015-07-13 MED ORDER — LIVING WELL WITH DIABETES BOOK
Freq: Once | Status: AC
  Administered 2015-07-13: 14:00:00
  Filled 2015-07-13: qty 1

## 2015-07-13 MED ORDER — POTASSIUM CHLORIDE CRYS ER 20 MEQ PO TBCR
40.0000 meq | EXTENDED_RELEASE_TABLET | ORAL | Status: AC
  Administered 2015-07-13 (×2): 40 meq via ORAL
  Filled 2015-07-13 (×2): qty 2

## 2015-07-13 MED ORDER — "PEN NEEDLES 3/16"" 31G X 5 MM MISC": 10.0000 [IU] | Freq: Every day | Status: DC

## 2015-07-13 NOTE — Progress Notes (Signed)
Discharge instructions and diabetes handouts given to and discussed with patient. All questions answered. IV removed and patient taken to entrance via wheelchair by NT.

## 2015-07-13 NOTE — Progress Notes (Signed)
NUTRITION NOTE   RD consulted for nutrition education regarding diabetes.   Lab Results  Component Value Date   HGBA1C 6.5 09/01/2013    RD provided "Carbohydrate Counting for People with Diabetes" and "Label Reading Tips for People with Diabetes" handouts from the Academy of Nutrition and Dietetics as well as handout outlining low carbohydrate and no carbohydrate snack options. Discussed different food groups and their effects on blood sugar, emphasizing carbohydrate-containing foods. Provided list of carbohydrates and recommended serving sizes of common foods. Pt knowledgeable about carbohydrate containing foods.  Discussed importance of controlled and consistent carbohydrate intake throughout the day. Provided examples of ways to balance meals/snacks and encouraged intake of high-fiber, whole grain complex carbohydrates. Also talked about the importance of protein at each meal and snack. Informed pt to look up menu items prior to going out to eat or to ask individuals at the restaurant for this information.Teach back method used.   Expect FAIR compliance.  Body mass index is 28.1 kg/(m^2). Pt meets criteria for overweight based on current BMI.  Current diet order is Carb Modified , patient is consuming approximately 100% of meals at this time. Labs and medications reviewed. No further nutrition interventions warranted at this time. RD contact information provided. If additional nutrition issues arise, please re-consult RD.     Jarome Matin, MS, RD, LDN Inpatient Clinical Dietitian Pager # [phone] After hours/weekend pager # [phone]

## 2015-07-13 NOTE — Discharge Summary (Signed)
Physician Discharge Summary  [name] RAX:[id number] DOB: May 30, 1955 DOA: 07/12/2015  PCP: Mauricio Po, FNP  Admit date: 07/12/2015 Discharge date: 07/13/2015  Time spent: *35 minutes  Recommendations for Outpatient Follow-up:  1. Follow-up cardiology in 3 weeks 2. Follow PCP in 2 weeks   Discharge Diagnoses:  Active Problems:   Chronic systolic heart failure (HCC)   CKD (chronic kidney disease) stage 2, GFR 60-89 ml/min   Chest pain   Hyperglycemia   Discharge Condition: Stable  Diet recommendation: Heart healthy/diabetic diet  Filed Weights   07/12/15 2200  Weight: 76.6 kg (168 lb 14 oz)    History of present illness:  60 y.o. male, With a history of diabetes mellitus, nonischemic cardio myopathy, CHF who came to the hospital with chief complaint of generalized weakness, dizziness, blurred vision. Patient then out of his medications for about 3 weeks and was seen in the ED 2 days ago at that time he was sent home with prescriptions of medications. Patient said that he has been taking them but did not feel good and started having weakness, blurred vision, dizziness. He also developed shortness of breath but denies any chest pain. In the ED patient was found to be in hyperglycemia, with blood glucose more than 700. Also had mild initial troponin. Patient started on IV insulin He denies nausea vomiting or diarrhea. No fever or dysuria  Hospital Course:  1. Hyperglycemia- patient presented with blood glucose greater than 700, AG 9. Patient was started on IV insulin, and which was switched to Lantus and sliding scale insulin. At this time blood sugars less than 200.  2. Dyspnea-patient does not have any chest pain, he has a history of CHF, chest x-ray showed no pulmonary edema. Has mild elevation of troponin 0.04, serial troponins also showed same value of 0.04. Likely from demand ischemia, as he did not have any chest pain and EKG showed no significant changes. Called and  discussed with cardiology. No intervention at this time. Patient to follow-up with cardiology in 3 weeks as outpatient. 3. Diabetes mellitus-start the patient on Lantus 10 units subcutaneous daily along with metformin 500 twice a day. We will discontinue Amaryl at this time. He will follow up with his PCP in 2 weeks 4. Acute on chronic kidney disease stage III- patient presenting with worsening of renal function with creatinine 2.08, baseline creatinine 1.28. After IV fluids patient's creatinine is down to 1.18 5. Hypertension- continue Coreg, ACE inhibitor and HCTZ 6. Nonischemic cardio myopathy/chronic systolic heart failure- currently compensated. Continue Imdur, hydralazine, ACE inhibitor. He will follow-up with cardiology in 3 weeks Procedures:  None  Consultations:  None  Discharge Exam: Filed Vitals:   07/13/15 0900 07/13/15 1200  BP: 110/62 111/61  Pulse: 85 95  Temp:    Resp: 18 19    General: Appears in no acute distress* Cardiovascular: S1-S2 regular Respiratory: Clear to auscultation bilaterally  Discharge Instructions   Discharge Instructions    Diet - low sodium heart healthy    Complete by:  As directed   Carb modified diet     Increase activity slowly    Complete by:  As directed           Current Discharge Medication List    START taking these medications   Details  blood glucose meter kit and supplies Dispense based on patient and insurance preference. Use up to four times daily as directed. (FOR ICD-9 250.00, 250.01). Qty: 1 each, Refills: 0    Insulin  Glargine (LANTUS SOLOSTAR) 100 UNIT/ML Solostar Pen Inject 10 Units into the skin daily at 10 pm. Qty: 15 mL, Refills: 11    Insulin Pen Needle (PEN NEEDLES 3/16") 31G X 5 MM MISC 10 Units by Does not apply route daily at 10 pm. Qty: 100 each, Refills: 2    metFORMIN (GLUCOPHAGE) 500 MG tablet Take 1 tablet (500 mg total) by mouth 2 (two) times daily with a meal. Qty: 60 tablet, Refills: 2       CONTINUE these medications which have NOT CHANGED   Details  Aspirin-Acetaminophen-Caffeine (GOODY HEADACHE PO) Take 1 packet by mouth daily as needed (Pain).    atorvastatin (LIPITOR) 20 MG tablet Take 20 mg by mouth daily.    carvedilol (COREG) 12.5 MG tablet Take 1 tablet (12.5 mg total) by mouth daily. Qty: 90 tablet, Refills: 0    hydrALAZINE (APRESOLINE) 50 MG tablet Take 1 tablet (50 mg total) by mouth 3 (three) times daily. Qty: 90 tablet, Refills: 1    hydrochlorothiazide (HYDRODIURIL) 25 MG tablet Take 1 tablet (25 mg total) by mouth daily. Qty: 90 tablet, Refills: 0    isosorbide mononitrate (IMDUR) 60 MG 24 hr tablet Take 1 tablet (60 mg total) by mouth daily. Qty: 90 tablet, Refills: 0    lisinopril (PRINIVIL,ZESTRIL) 20 MG tablet Take 1 tablet (20 mg total) by mouth 2 (two) times daily. Qty: 180 tablet, Refills: 0      STOP taking these medications     glimepiride (AMARYL) 2 MG tablet        Allergies  Allergen Reactions  . Ambien [Zolpidem Tartrate] Other (See Comments)    "Went berserk."   Follow-up Information    Follow up with Almyra Deforest, PA On 08/02/2015.   Specialties:  Cardiology, Radiology   Why:  Cardiology Hospital Follow-Up on 08/02/2015 at 1:30PM.    Contact information:   [place] [id number] [phone]        The results of significant diagnostics from this hospitalization (including imaging, microbiology, ancillary and laboratory) are listed below for reference.    Significant Diagnostic Studies: Dg Chest 2 View  07/12/2015  CLINICAL DATA:  History of diabetes, CHF, prostate cancer. Increased shortness of breath and heart palpitations starting today. EXAM: CHEST  2 VIEW COMPARISON:  Chest x-ray dated 02/16/2014. FINDINGS: Heart size is normal. Overall cardiomediastinal silhouette is stable in size and configuration. Lungs are clear. No pleural effusion no pneumothorax seen. Osseous and soft tissue structures about  the chest are unremarkable. IMPRESSION: Stable chest x-ray.  No active cardiopulmonary disease. Electronically Signed   By: Franki Cabot M.D.   On: 07/12/2015 15:57    Microbiology: Recent Results (from the past 240 hour(s))  MRSA PCR Screening     Status: Abnormal   Collection Time: 07/12/15  9:55 PM  Result Value Ref Range Status   MRSA by PCR POSITIVE (A) NEGATIVE Final    Comment:        The GeneXpert MRSA Assay (FDA approved for NASAL specimens only), is one component of a comprehensive MRSA colonization surveillance program. It is not intended to diagnose MRSA infection nor to guide or monitor treatment for MRSA infections. RESULT CALLED TO, READ BACK BY AND VERIFIED WITH: Oretha Milch [id number] @ [id number] BY J SCOTTON      Labs: Basic Metabolic Panel:  Recent Labs Lab 07/10/15 1034 07/12/15 1621 07/12/15 2024 07/13/15 0200 07/13/15 0731  NA 131* 125* 135 136 136  K  4.8 3.8 3.2* 2.5* 3.6  CL 97* 95* 103 105 105  CO2 21* 21* '23 24 25  ' GLUCOSE 511* 778* 273* 197* 220*  BUN 17 24* 21* 18 16  CREATININE 1.34* 2.08* 1.53* 1.16 1.18  CALCIUM 9.7 8.9 9.0 8.7* 8.6*   Liver Function Tests:  Recent Labs Lab 07/12/15 1621 07/13/15 0200  AST 29 18  ALT 18 17  ALKPHOS 90 74  BILITOT 1.1 1.1  PROT 6.8 6.6  ALBUMIN 3.4* 3.3*   No results for input(s): LIPASE, AMYLASE in the last 168 hours. No results for input(s): AMMONIA in the last 168 hours. CBC:  Recent Labs Lab 07/10/15 1034 07/12/15 1621 07/13/15 0200  WBC 6.9 7.8 7.2  NEUTROABS  --  4.8  --   HGB 14.7 12.2* 11.8*  HCT 40.8 35.9* 33.4*  MCV 83.6 87.1 83.3  PLT 192 295 259   Cardiac Enzymes:  Recent Labs Lab 07/12/15 1621 07/12/15 2024 07/13/15 0200 07/13/15 0731  TROPONINI 0.04* 0.04* 0.04* 0.04*   BNP: BNP (last 3 results)  Recent Labs  07/12/15 1621  BNP 17.8    ProBNP (last 3 results) No results for input(s): PROBNP in the last 8760 hours.  CBG:  Recent Labs Lab  07/13/15 0043 07/13/15 0146 07/13/15 0306 07/13/15 0804 07/13/15 1316  GLUCAP 175* 206* 194* 227* 318*       Signed:  Eleonore Chiquito S MD.  [facility] 07/13/2015, 2:18 PM

## 2015-07-13 NOTE — Progress Notes (Addendum)
Inpatient Diabetes Program Recommendations  AACE/ADA: New Consensus Statement on Inpatient Glycemic Control (2015)  Target Ranges:  Prepandial:   less than 140 mg/dL      Peak postprandial:   less than 180 mg/dL (1-2 hours)      Critically ill patients:  140 - 180 mg/dL   Results for IZACC, DORSETT (MRN [id number]) as of 07/13/2015 08:30  Ref. Range 07/12/2015 17:55 07/12/2015 18:56 07/12/2015 20:45 07/12/2015 21:48 07/12/2015 23:35 07/13/2015 00:43 07/13/2015 01:46 07/13/2015 03:06  Glucose-Capillary Latest Ref Range: 65-99 mg/dL >600 (HH) 369 (H) 271 (H) 262 (H) 161 (H) 175 (H) 206 (H) 194 (H)    Admit with: Hyperglycemia  History: DM, CHF  Home DM Meds: Amaryl 2 mg daily (ran out of meds about 3 weeks prior to admission)  Current Insulin Orders: Lantus 10 units QHS      Novolog Moderate Correction Scale/ SSI (0-15 units) TID AC + HS      -Current A1c pending- Expect results to be high given the fact that patient admitted with lab glucose of 778 mg/dl.  -Note 1st dose of Lantus given at 1am today to transition off IV Insulin drip.  -Explained to patient that he has a current A1c pending.  Explained what an A1c is and what it measures.  Reminded patient that his goal A1c is 7% or less per ADA standards to prevent both acute and long-term complications.  Explained to patient the extreme importance of good glucose control at home.  Encouraged patient to check his CBGs at least bid at home (fasting and another check within the day) and to record all CBGs in a logbook for his PCP to review.  Also reviewed basic DM pathophysiology and reviewed CBG goals at home with patient.  Patient requested visit with RD before discharge home.  Encouraged patient to avoid beverages with carbohydrates (regular soda, fruit juice, sweet tea).    -Patient told me he was diagnosed with DM about 3 years ago.  Was initially treated with insulin (could not remember the name but told me it was one shot a day).   Transitioned to Amaryl daily a few month later and has been taking Amaryl for several years.  Has not seen his PCP (with [facility]) since October 2016.  Told me he doesn't like to go to the doctor b/c he has to pay a co-pay of $50.  Explained to patient that he needs a PCP to help him better manage his diabetes.  Encouraged patient to follow-up with PCP asap after d/c.  Patient also told me he needs a CBG meter at discharge.  Has used insulin pens in the past and would prefer insulin pens again if the decision is made to send him home on insulin.  Does not have a CBG meter at home.  Will ask MD for Rx for CBG meter for patient and also gave patient information on purchasing an inexpensive CBG meter and strips at [employer] [facility] without a Rx.  Meter is $9 and a box of 50 strips is $9.   -Reviewed all steps of insulin pen with patient including attachment of needle, 2-unit air shot, dialing up dose, giving injection, removing needle, disposal of sharps, storage of unused insulin, disposal of insulin etc.  Patient able to provide successful return demonstration.  Reviewed troubleshooting with insulin pen.  Also reviewed Signs/Symptoms of Hypoglycemia with patient and how to treat Hypoglycemia at home.  Have asked RNs caring for patient to please allow patient to give  all injections here in hospital as much as possible for practice.  MD to give patient Rxs for insulin pens and insulin pen needles.        MD- If you decide to send patient home on insulin, please give him Rx for insulin pens and pen needles.  Patient also needs CBG meter at time of d/c.  1. Lantus Insulin Pen- Order [phone]  2. Insulin Pen needles- Order [phone]  3. CBG Meter and Supplies- Order [id number]    --Will follow patient during hospitalization--  Wyn Quaker RN, MSN, CDE Diabetes Coordinator Inpatient Glycemic Control Team Team Pager: [phone] (8a-5p)

## 2015-07-14 LAB — HEMOGLOBIN A1C
Hgb A1c MFr Bld: 13.8 % — ABNORMAL HIGH (ref 4.8–5.6)
Mean Plasma Glucose: 349 mg/dL

## 2015-07-19 ENCOUNTER — Observation Stay (HOSPITAL_BASED_OUTPATIENT_CLINIC_OR_DEPARTMENT_OTHER): Payer: BLUE CROSS/BLUE SHIELD

## 2015-07-19 ENCOUNTER — Observation Stay (HOSPITAL_COMMUNITY)
Admission: EM | Admit: 2015-07-19 | Discharge: 2015-07-21 | Disposition: A | Discharge status: Home / Self Care | Payer: BLUE CROSS/BLUE SHIELD | Attending: Internal Medicine | Admitting: Internal Medicine

## 2015-07-19 ENCOUNTER — Other Ambulatory Visit: Payer: Self-pay

## 2015-07-19 ENCOUNTER — Emergency Department (HOSPITAL_COMMUNITY): Payer: BLUE CROSS/BLUE SHIELD

## 2015-07-19 ENCOUNTER — Encounter (HOSPITAL_COMMUNITY): Payer: Self-pay

## 2015-07-19 DIAGNOSIS — I2510 Atherosclerotic heart disease of native coronary artery without angina pectoris: Secondary | ICD-10-CM | POA: Insufficient documentation

## 2015-07-19 DIAGNOSIS — E1165 Type 2 diabetes mellitus with hyperglycemia: Secondary | ICD-10-CM | POA: Diagnosis not present

## 2015-07-19 DIAGNOSIS — Z8679 Personal history of other diseases of the circulatory system: Secondary | ICD-10-CM | POA: Diagnosis not present

## 2015-07-19 DIAGNOSIS — Z794 Long term (current) use of insulin: Secondary | ICD-10-CM | POA: Insufficient documentation

## 2015-07-19 DIAGNOSIS — R0600 Dyspnea, unspecified: Secondary | ICD-10-CM | POA: Diagnosis not present

## 2015-07-19 DIAGNOSIS — M4690 Unspecified inflammatory spondylopathy, site unspecified: Secondary | ICD-10-CM | POA: Insufficient documentation

## 2015-07-19 DIAGNOSIS — R0602 Shortness of breath: Secondary | ICD-10-CM | POA: Diagnosis present

## 2015-07-19 DIAGNOSIS — E7800 Pure hypercholesterolemia, unspecified: Secondary | ICD-10-CM | POA: Diagnosis present

## 2015-07-19 DIAGNOSIS — N179 Acute kidney failure, unspecified: Secondary | ICD-10-CM | POA: Diagnosis not present

## 2015-07-19 DIAGNOSIS — IMO0002 Reserved for concepts with insufficient information to code with codable children: Secondary | ICD-10-CM | POA: Diagnosis present

## 2015-07-19 DIAGNOSIS — I5022 Chronic systolic (congestive) heart failure: Secondary | ICD-10-CM | POA: Diagnosis present

## 2015-07-19 DIAGNOSIS — Z79899 Other long term (current) drug therapy: Secondary | ICD-10-CM | POA: Diagnosis not present

## 2015-07-19 DIAGNOSIS — I129 Hypertensive chronic kidney disease with stage 1 through stage 4 chronic kidney disease, or unspecified chronic kidney disease: Secondary | ICD-10-CM | POA: Diagnosis not present

## 2015-07-19 DIAGNOSIS — E871 Hypo-osmolality and hyponatremia: Secondary | ICD-10-CM | POA: Diagnosis not present

## 2015-07-19 DIAGNOSIS — E1122 Type 2 diabetes mellitus with diabetic chronic kidney disease: Secondary | ICD-10-CM | POA: Insufficient documentation

## 2015-07-19 DIAGNOSIS — R739 Hyperglycemia, unspecified: Secondary | ICD-10-CM

## 2015-07-19 DIAGNOSIS — N183 Chronic kidney disease, stage 3 (moderate): Secondary | ICD-10-CM | POA: Insufficient documentation

## 2015-07-19 DIAGNOSIS — E119 Type 2 diabetes mellitus without complications: Secondary | ICD-10-CM | POA: Diagnosis present

## 2015-07-19 DIAGNOSIS — N182 Chronic kidney disease, stage 2 (mild): Secondary | ICD-10-CM | POA: Diagnosis present

## 2015-07-19 LAB — COMPREHENSIVE METABOLIC PANEL
ALT: 31 U/L (ref 17–63)
AST: 30 U/L (ref 15–41)
Albumin: 4 g/dL (ref 3.5–5.0)
Alkaline Phosphatase: 81 U/L (ref 38–126)
Anion gap: 12 (ref 5–15)
BUN: 37 mg/dL — ABNORMAL HIGH (ref 6–20)
CO2: 20 mmol/L — ABNORMAL LOW (ref 22–32)
Calcium: 10.3 mg/dL (ref 8.9–10.3)
Chloride: 100 mmol/L — ABNORMAL LOW (ref 101–111)
Creatinine, Ser: 2.3 mg/dL — ABNORMAL HIGH (ref 0.61–1.24)
GFR calc Af Amer: 34 mL/min — ABNORMAL LOW (ref >60)
GFR calc non Af Amer: 29 mL/min — ABNORMAL LOW (ref >60)
Glucose, Bld: 411 mg/dL — ABNORMAL HIGH (ref 65–99)
Potassium: 4.5 mmol/L (ref 3.5–5.1)
Sodium: 132 mmol/L — ABNORMAL LOW (ref 135–145)
Total Bilirubin: 1 mg/dL (ref 0.3–1.2)
Total Protein: 8 g/dL (ref 6.5–8.1)

## 2015-07-19 LAB — CBC WITH DIFFERENTIAL/PLATELET
Basophils Absolute: 0.1 10*3/uL (ref 0.0–0.1)
Basophils Relative: 1 %
Eosinophils Absolute: 0.1 10*3/uL (ref 0.0–0.7)
Eosinophils Relative: 1 %
HCT: 39.6 % (ref 39.0–52.0)
Hemoglobin: 13.6 g/dL (ref 13.0–17.0)
Lymphocytes Relative: 30 %
Lymphs Abs: 2.6 10*3/uL (ref 0.7–4.0)
MCH: 29.8 pg (ref 26.0–34.0)
MCHC: 34.3 g/dL (ref 30.0–36.0)
MCV: 86.8 fL (ref 78.0–100.0)
Monocytes Absolute: 0.8 10*3/uL (ref 0.1–1.0)
Monocytes Relative: 9 %
Neutro Abs: 5.3 10*3/uL (ref 1.7–7.7)
Neutrophils Relative %: 59 %
Platelets: 347 10*3/uL (ref 150–400)
RBC: 4.56 MIL/uL (ref 4.22–5.81)
RDW: 12.8 % (ref 11.5–15.5)
WBC: 8.8 10*3/uL (ref 4.0–10.5)

## 2015-07-19 LAB — ECHOCARDIOGRAM COMPLETE
FS: 26 % — AB (ref 28–44)
Height: 65 in
IVS/LV PW RATIO, ED: 0.98
LA ID, A-P, ES: 39 mm
LA diam end sys: 39 mm
LA diam index: 2.12 cm/m2
LA vol A4C: 68.3 ml
LA vol index: 40.1 mL/m2
LA vol: 73.7 mL
LV PW d: 17.5 mm — AB (ref 0.6–1.1)
LV e' LATERAL: 11.5 cm/s
LVOT area: 3.14 cm2
LVOT diameter: 20 mm
TDI e' lateral: 11.5
TDI e' medial: 4.13
Weight: 2715.2 oz

## 2015-07-19 LAB — BLOOD GAS, VENOUS
Acid-base deficit: 0.8 mmol/L (ref 0.0–2.0)
Bicarbonate: 22.1 mEq/L (ref 20.0–24.0)
FIO2: 0.21
O2 Saturation: 63 %
Patient temperature: 98.6
TCO2: 19.8 mmol/L (ref 0–100)
pCO2, Ven: 32.7 mmHg — ABNORMAL LOW (ref 45.0–50.0)
pH, Ven: 7.444 — ABNORMAL HIGH (ref 7.250–7.300)
pO2, Ven: 33.1 mmHg (ref 31.0–45.0)

## 2015-07-19 LAB — TROPONIN I
Troponin I: 0.03 ng/mL (ref <0.031)
Troponin I: 0.03 ng/mL (ref <0.031)
Troponin I: 0.03 ng/mL (ref <0.031)

## 2015-07-19 LAB — URINALYSIS, ROUTINE W REFLEX MICROSCOPIC
Bilirubin Urine: NEGATIVE
Glucose, UA: >1000 mg/dL — AB
Hgb urine dipstick: NEGATIVE
Ketones, ur: NEGATIVE mg/dL
Leukocytes, UA: NEGATIVE
Nitrite: NEGATIVE
Protein, ur: NEGATIVE mg/dL
Specific Gravity, Urine: 1.023 (ref 1.005–1.030)
pH: 5.5 (ref 5.0–8.0)

## 2015-07-19 LAB — CBG MONITORING, ED
Glucose-Capillary: 414 mg/dL — ABNORMAL HIGH (ref 65–99)
Glucose-Capillary: 333 mg/dL — ABNORMAL HIGH (ref 65–99)

## 2015-07-19 LAB — RAPID URINE DRUG SCREEN, HOSP PERFORMED
Amphetamines: NOT DETECTED
Barbiturates: NOT DETECTED
Benzodiazepines: NOT DETECTED
Cocaine: NOT DETECTED
Opiates: NOT DETECTED
Tetrahydrocannabinol: NOT DETECTED

## 2015-07-19 LAB — URINE MICROSCOPIC-ADD ON

## 2015-07-19 LAB — GLUCOSE, CAPILLARY
Glucose-Capillary: 339 mg/dL — ABNORMAL HIGH (ref 65–99)
Glucose-Capillary: 294 mg/dL — ABNORMAL HIGH (ref 65–99)

## 2015-07-19 LAB — BRAIN NATRIURETIC PEPTIDE: B Natriuretic Peptide: 11.6 pg/mL (ref 0.0–100.0)

## 2015-07-19 LAB — D-DIMER, QUANTITATIVE: D-Dimer, Quant: <0.27 ug/mL-FEU (ref 0.00–0.50)

## 2015-07-19 MED ORDER — INSULIN GLARGINE 100 UNIT/ML ~~LOC~~ SOLN
10.0000 [IU] | Freq: Every day | SUBCUTANEOUS | Status: DC
  Administered 2015-07-19 – 2015-07-20 (×2): 10 [IU] via SUBCUTANEOUS
  Filled 2015-07-19 (×2): qty 0.1

## 2015-07-19 MED ORDER — GLUCERNA 1.2 CAL PO LIQD: 237.0000 mL | Freq: Two times a day (BID) | ORAL | Status: DC

## 2015-07-19 MED ORDER — GLUCERNA 1.2 CAL PO LIQD: 1000.0000 mL | ORAL | Status: DC

## 2015-07-19 MED ORDER — GLUCERNA 1.2 CAL PO LIQD
237.0000 mL | Freq: Two times a day (BID) | ORAL | Status: DC
  Administered 2015-07-20: 237 mL via ORAL
  Filled 2015-07-19 (×2): qty 237

## 2015-07-19 MED ORDER — SODIUM CHLORIDE 0.9 % IV SOLN
INTRAVENOUS | Status: DC
  Administered 2015-07-20 – 2015-07-21 (×3): via INTRAVENOUS

## 2015-07-19 MED ORDER — CARVEDILOL 12.5 MG PO TABS
12.5000 mg | ORAL_TABLET | Freq: Every day | ORAL | Status: DC
  Administered 2015-07-20 – 2015-07-21 (×2): 12.5 mg via ORAL
  Filled 2015-07-19 (×2): qty 1

## 2015-07-19 MED ORDER — SODIUM CHLORIDE 0.9 % IV BOLUS (SEPSIS)
1000.0000 mL | Freq: Once | INTRAVENOUS | Status: AC
  Administered 2015-07-19: 1000 mL via INTRAVENOUS

## 2015-07-19 MED ORDER — SODIUM CHLORIDE 0.9% FLUSH
3.0000 mL | Freq: Two times a day (BID) | INTRAVENOUS | Status: DC
  Administered 2015-07-20 (×2): 3 mL via INTRAVENOUS

## 2015-07-19 MED ORDER — SODIUM CHLORIDE 0.9 % IV SOLN
INTRAVENOUS | Status: DC
  Administered 2015-07-19: 125 mL/h via INTRAVENOUS

## 2015-07-19 MED ORDER — ONDANSETRON HCL 4 MG PO TABS
4.0000 mg | ORAL_TABLET | Freq: Four times a day (QID) | ORAL | Status: DC | PRN
Start: 2015-07-19 — End: 2015-07-21

## 2015-07-19 MED ORDER — ALBUTEROL SULFATE (2.5 MG/3ML) 0.083% IN NEBU: 2.5000 mg | INHALATION_SOLUTION | Freq: Four times a day (QID) | RESPIRATORY_TRACT | Status: DC | PRN

## 2015-07-19 MED ORDER — INSULIN ASPART 100 UNIT/ML ~~LOC~~ SOLN: 0.0000 [IU] | Freq: Three times a day (TID) | SUBCUTANEOUS | Status: DC

## 2015-07-19 MED ORDER — MORPHINE SULFATE (PF) 2 MG/ML IV SOLN: 1.0000 mg | INTRAVENOUS | Status: DC | PRN

## 2015-07-19 MED ORDER — INSULIN ASPART 100 UNIT/ML ~~LOC~~ SOLN
0.0000 [IU] | Freq: Three times a day (TID) | SUBCUTANEOUS | Status: DC
  Administered 2015-07-19 – 2015-07-20 (×3): 7 [IU] via SUBCUTANEOUS
  Administered 2015-07-20 (×2): 5 [IU] via SUBCUTANEOUS
  Administered 2015-07-21: 3 [IU] via SUBCUTANEOUS
  Filled 2015-07-19: qty 1

## 2015-07-19 MED ORDER — ONDANSETRON HCL 4 MG/2ML IJ SOLN: 4.0000 mg | Freq: Four times a day (QID) | INTRAMUSCULAR | Status: DC | PRN

## 2015-07-19 MED ORDER — ENSURE ENLIVE PO LIQD
237.0000 mL | Freq: Two times a day (BID) | ORAL | Status: DC
  Administered 2015-07-19: 237 mL via ORAL

## 2015-07-19 MED ORDER — HEPARIN SODIUM (PORCINE) 5000 UNIT/ML IJ SOLN
5000.0000 [IU] | Freq: Three times a day (TID) | INTRAMUSCULAR | Status: DC
Start: 2015-07-19 — End: 2015-07-21
  Administered 2015-07-19 – 2015-07-21 (×6): 5000 [IU] via SUBCUTANEOUS
  Filled 2015-07-19 (×6): qty 1

## 2015-07-19 MED ORDER — ISOSORBIDE MONONITRATE ER 30 MG PO TB24
60.0000 mg | ORAL_TABLET | Freq: Every day | ORAL | Status: DC
  Administered 2015-07-20 – 2015-07-21 (×2): 60 mg via ORAL
  Filled 2015-07-19 (×2): qty 2

## 2015-07-19 MED ORDER — OXYCODONE HCL 5 MG PO TABS
5.0000 mg | ORAL_TABLET | ORAL | Status: DC | PRN
  Administered 2015-07-19 – 2015-07-20 (×3): 5 mg via ORAL
  Filled 2015-07-19 (×3): qty 1

## 2015-07-19 NOTE — ED Provider Notes (Signed)
CSN: [id number]     Arrival date & time 07/19/15  1015 History   First MD Initiated Contact with Patient 07/19/15 1033     Chief Complaint  Patient presents with  . Shortness of Breath     (Consider location/radiation/quality/duration/timing/severity/associated sxs/prior Treatment) HPI  60 year old male with a history of cardiomyopathy and type 2 diabetes presents with shortness of breath. Patient states that since he left the hospital one week ago he's been sort of short of breath but it significant worse and this morning. Also feels dizzy and has blurred vision which she had last time when he had significant hyperglycemia. Patient states he had fluid overload before and thinks this is similar. No leg or abdominal swelling. Has a mild cough with some yellow sputum. No fevers. No chest pain or pressure. Feels some fluttering in his chest but specifically denies pain or pressure. Patient states he's been compliant with all of his medicines. Shortness of breath is worse with movement and exertion. Is unsure if he gets worse with lying flat because he is always short of breath with lying flat.  Past Medical History  Diagnosis Date  . CAD     nonobst cath 2004  . CARDIOMYOPATHY     NICM, LVEF 30% 01/2011 echo; 20-25% 07/2012 echo  . DIABETES MELLITUS, TYPE II   . HYPERCHOLESTEROLEMIA   . HYPERTENSION   . PSA, INCREASED   . Noncompliance   . CHF (congestive heart failure) ([place])   . PVCs (premature ventricular contractions)     s/p ablation 01-07-2013 by Dr Rayann Heman  . Asthma     years ago   . Arthritis     lower back   . Cancer [facility])     prostate cancer   Past Surgical History  Procedure Laterality Date  . Cardiac catheterization  2004    nonobst dz  . Surgery scrotal / testicular      at age 87  . Ablation  01-07-2013    RVOT PVC's ablated by Dr Rayann Heman along anteroseptal RVOT  . Cholecystectomy N/A 11/16/2013    Procedure: LAPAROSCOPIC CHOLECYSTECTOMY WITH INTRAOPERATIVE  CHOLANGIOGRAM;  Surgeon: [name], MD;  Location: [facility] ORS;  Service: General;  Laterality: N/A;  . Supraventricular tachycardia ablation N/A 09/28/2012    Procedure: Tanna Furry Ablation;  Surgeon: [name], MD;  Location: [facility] CATH LAB;  Service: Cardiovascular;  Laterality: N/A;  . V-tach ablation N/A 01/07/2013    Procedure: V-TACH ABLATION;  Surgeon: [name], MD;  Location: [place] CATH LAB;  Service: Cardiovascular;  Laterality: N/A;  . Robot assisted laparoscopic radical prostatectomy N/A 02/22/2014    Procedure: ROBOTIC ASSISTED LAPAROSCOPIC RADICAL PROSTATECTOMY WITH INDOCYANINE GREEN DYE AND OPEN UMBILICAL HERNIA REPAIR;  Surgeon: [name], MD;  Location: [facility] ORS;  Service: Urology;  Laterality: N/A;  . Lymphadenectomy Bilateral 02/22/2014    Procedure: PELVIC LYMPH NODE DISSECTION;  Surgeon: [name], MD;  Location: [facility] ORS;  Service: Urology;  Laterality: Bilateral;   Family History  Problem Relation Age of Onset  . Peripheral vascular disease    . Hypertension    . Prostate cancer    . Hypertension Mother   . Peripheral vascular disease Mother   . Prostate cancer Brother    Social History  Substance Use Topics  . Smoking status: Never Smoker   . Smokeless tobacco: Never Used     Comment: works for [employer]  . Alcohol Use: No     Comment: rare  Review of Systems  Constitutional: Negative for fever.  Eyes: Positive for visual disturbance.  Respiratory: Positive for cough and shortness of breath.   Cardiovascular: Negative for chest pain and leg swelling.  Gastrointestinal: Negative for vomiting, abdominal pain and abdominal distention.  Neurological: Positive for dizziness and weakness.  All other systems reviewed and are negative.     Allergies  Ambien  Home Medications   Prior to Admission medications   Medication Sig Start Date End Date Taking? Authorizing Provider  Aspirin-Acetaminophen-Caffeine (GOODY  HEADACHE PO) Take 1 packet by mouth daily as needed (Pain).    Historical Provider, MD  atorvastatin (LIPITOR) 20 MG tablet Take 20 mg by mouth daily.    Historical Provider, MD  blood glucose meter kit and supplies Dispense based on patient and insurance preference. Use up to four times daily as directed. (FOR ICD-9 250.00, 250.01). 07/13/15   Oswald Hillock, MD  carvedilol (COREG) 12.5 MG tablet Take 1 tablet (12.5 mg total) by mouth daily. 07/10/15   Lacretia Leigh, MD  hydrALAZINE (APRESOLINE) 50 MG tablet Take 1 tablet (50 mg total) by mouth 3 (three) times daily. 07/10/15   Lacretia Leigh, MD  hydrochlorothiazide (HYDRODIURIL) 25 MG tablet Take 1 tablet (25 mg total) by mouth daily. 07/10/15   Lacretia Leigh, MD  Insulin Glargine (LANTUS SOLOSTAR) 100 UNIT/ML Solostar Pen Inject 10 Units into the skin daily at 10 pm. 07/13/15   Oswald Hillock, MD  Insulin Pen Needle (PEN NEEDLES 3/16") 31G X 5 MM MISC 10 Units by Does not apply route daily at 10 pm. 07/13/15   Oswald Hillock, MD  isosorbide mononitrate (IMDUR) 60 MG 24 hr tablet Take 1 tablet (60 mg total) by mouth daily. 07/10/15   Lacretia Leigh, MD  lisinopril (PRINIVIL,ZESTRIL) 20 MG tablet Take 1 tablet (20 mg total) by mouth 2 (two) times daily. 07/10/15   Lacretia Leigh, MD  metFORMIN (GLUCOPHAGE) 500 MG tablet Take 1 tablet (500 mg total) by mouth 2 (two) times daily with a meal. 07/13/15   Oswald Hillock, MD   BP 92/68 mmHg  Pulse 72  Temp(Src) 98.3 F (36.8 C) (Oral)  Resp 40  SpO2 100% Physical Exam  Constitutional: He is oriented to person, place, and time. He appears well-developed and well-nourished. No distress.  HENT:  Head: Normocephalic and atraumatic.  Right Ear: External ear normal.  Left Ear: External ear normal.  Nose: Nose normal.  Eyes: Right eye exhibits no discharge. Left eye exhibits no discharge.  Neck: Neck supple.  Cardiovascular: Normal rate, regular rhythm, normal heart sounds and intact distal pulses.   Pulmonary/Chest:  Effort normal and breath sounds normal. Tachypnea noted. He has no wheezes. He has no rales.  Mildly decreased BS at bases  Abdominal: Soft. He exhibits no distension. There is no tenderness.  Musculoskeletal: He exhibits no edema.  Neurological: He is alert and oriented to person, place, and time.  Skin: Skin is warm and dry. He is not diaphoretic.  Nursing note and vitals reviewed.   ED Course  Procedures (including critical care time) Labs Review Labs Reviewed  COMPREHENSIVE METABOLIC PANEL - Abnormal; Notable for the following:    Sodium 132 (*)    Chloride 100 (*)    CO2 20 (*)    Glucose, Bld 411 (*)    BUN 37 (*)    Creatinine, Ser 2.30 (*)    GFR calc non Af Amer 29 (*)    GFR calc Af Amer 34 (*)  All other components within normal limits  BLOOD GAS, VENOUS - Abnormal; Notable for the following:    pH, Ven 7.444 (*)    pCO2, Ven 32.7 (*)    All other components within normal limits  URINALYSIS, ROUTINE W REFLEX MICROSCOPIC (NOT AT [facility]) - Abnormal; Notable for the following:    Glucose, UA >1000 (*)    All other components within normal limits  URINE MICROSCOPIC-ADD ON - Abnormal; Notable for the following:    Squamous Epithelial / LPF 0-5 (*)    Bacteria, UA FEW (*)    Casts HYALINE CASTS (*)    All other components within normal limits  GLUCOSE, CAPILLARY - Abnormal; Notable for the following:    Glucose-Capillary 339 (*)    All other components within normal limits  CBG MONITORING, ED - Abnormal; Notable for the following:    Glucose-Capillary 414 (*)    All other components within normal limits  CBG MONITORING, ED - Abnormal; Notable for the following:    Glucose-Capillary 333 (*)    All other components within normal limits  BRAIN NATRIURETIC PEPTIDE  TROPONIN I  CBC WITH DIFFERENTIAL/PLATELET  D-DIMER, QUANTITATIVE (NOT AT [facility])  TROPONIN I  URINE RAPID DRUG SCREEN, HOSP PERFORMED  TROPONIN I  TROPONIN I  BASIC METABOLIC PANEL  CBC    Imaging  Review Dg Chest 2 View  07/19/2015  CLINICAL DATA:  Dyspnea EXAM: CHEST  2 VIEW COMPARISON:  07/12/2015 FINDINGS: The heart size and mediastinal contours are within normal limits. Both lungs are clear. The visualized skeletal structures are unremarkable. IMPRESSION: No active cardiopulmonary disease. Electronically Signed   By: Franchot Gallo M.D.   On: 07/19/2015 11:41   I have personally reviewed and evaluated these images and lab results as part of my medical decision-making.   EKG Interpretation   Date/Time:  Thursday July 19 2015 10:26:58 EDT Ventricular Rate:  72 PR Interval:    QRS Duration: 120 QT Interval:  395 QTC Calculation: 433 R Axis:   -58 Text Interpretation:  Sinus rhythm Left anterior fascicular block Left  ventricular hypertrophy Nonspecific T abnormalities, inferior leads  artifact limits interpretation no significant change since July 12 2015  Confirmed by Regenia Skeeter MD, Eban Weick [phone]) on 07/19/2015 10:40:56 AM      MDM   Final diagnoses:  Acute kidney injury ([place])  Hyperglycemia    I think patient's primary issue is over diuresis causing an acute kidney injury. He does not appear fluid overloaded. Chest x-ray is clear and BNP is normal. He is also hyperglycemic again, unclear if this is from poor compliance as he has a history of this. Patient will be given IV fluids although he will need some caution given his history of cardiomyopathy. No acidosis and thus DKA and does not seem present. Will admit to the hospitalist.    Sherwood Gambler, MD 07/19/15 [phone]

## 2015-07-19 NOTE — H&P (Signed)
History and Physical    [name]:[id number] DOB: 11-06-1955 DOA: 07/19/2015  PCP: Mauricio Po, FNP  Patient coming from: Home   Chief Complaint: Dyspnea.   HPI: [name] is a 60 y.o. male with medical history significant for  Cardiomyopathy last EF 45 % by ECHO 2015, DM uncontrolled, recently discharge from hospital on 6-16 treated at that time for  hyperglycemia, dyspnea. During last admission his troponin was mildly elevated,case was discussed with cardiology at that time and patient needed to follow up with cardio outpatient. He presents today complaining of SOB that started again 2 days prior to admission.SOB at rest and on exertion. He denies chest pain. He report complain with diet and medications.   ED Course: hyperglycemia, CBG at 400, cr at 2, BNP normal, troponin negative, D dime at 0.27,chest x ray no active cardiopulmonary diseases.    Review of Systems: As per HPI otherwise 10 point review of systems negative.    Past Medical History  Diagnosis Date  . CAD     nonobst cath 2004  . CARDIOMYOPATHY     NICM, LVEF 30% 01/2011 echo; 20-25% 07/2012 echo  . DIABETES MELLITUS, TYPE II   . HYPERCHOLESTEROLEMIA   . HYPERTENSION   . PSA, INCREASED   . Noncompliance   . CHF (congestive heart failure) ([place])   . PVCs (premature ventricular contractions)     s/p ablation 01-07-2013 by Dr Rayann Heman  . Asthma     years ago   . Arthritis     lower back   . Cancer [facility])     prostate cancer    Past Surgical History  Procedure Laterality Date  . Cardiac catheterization  2004    nonobst dz  . Surgery scrotal / testicular      at age 32  . Ablation  01-07-2013    RVOT PVC's ablated by Dr Rayann Heman along anteroseptal RVOT  . Cholecystectomy N/A 11/16/2013    Procedure: LAPAROSCOPIC CHOLECYSTECTOMY WITH INTRAOPERATIVE CHOLANGIOGRAM;  Surgeon: [name], MD;  Location: [facility] ORS;  Service: General;  Laterality: N/A;  . Supraventricular tachycardia ablation  N/A 09/28/2012    Procedure: Tanna Furry Ablation;  Surgeon: [name], MD;  Location: [facility] CATH LAB;  Service: Cardiovascular;  Laterality: N/A;  . V-tach ablation N/A 01/07/2013    Procedure: V-TACH ABLATION;  Surgeon: [name], MD;  Location: [facility] CATH LAB;  Service: Cardiovascular;  Laterality: N/A;  . Robot assisted laparoscopic radical prostatectomy N/A 02/22/2014    Procedure: ROBOTIC ASSISTED LAPAROSCOPIC RADICAL PROSTATECTOMY WITH INDOCYANINE GREEN DYE AND OPEN UMBILICAL HERNIA REPAIR;  Surgeon: [name], MD;  Location: [facility] ORS;  Service: Urology;  Laterality: N/A;  . Lymphadenectomy Bilateral 02/22/2014    Procedure: PELVIC LYMPH NODE DISSECTION;  Surgeon: [name], MD;  Location: [facility] ORS;  Service: Urology;  Laterality: Bilateral;     reports that he has never smoked. He has never used smokeless tobacco. He reports that he does not drink alcohol or use illicit drugs.  Allergies  Allergen Reactions  . Ambien [Zolpidem Tartrate] Other (See Comments)    "Went berserk."    Family History  Problem Relation Age of Onset  . Peripheral vascular disease    . Hypertension    . Prostate cancer    . Hypertension Mother   . Peripheral vascular disease Mother   . Prostate cancer Brother     Prior to Admission medications   Medication Sig Start Date End Date Taking? Authorizing Provider  Aspirin-Acetaminophen-Caffeine (  GOODY HEADACHE PO) Take 1 packet by mouth daily as needed (Pain).   Yes Historical Provider, MD  blood glucose meter kit and supplies Dispense based on patient and insurance preference. Use up to four times daily as directed. (FOR ICD-9 250.00, 250.01). 07/13/15  Yes Oswald Hillock, MD  carvedilol (COREG) 12.5 MG tablet Take 1 tablet (12.5 mg total) by mouth daily. 07/10/15  Yes Lacretia Leigh, MD  hydrALAZINE (APRESOLINE) 50 MG tablet Take 1 tablet (50 mg total) by mouth 3 (three) times daily. 07/10/15  Yes Lacretia Leigh, MD  hydrochlorothiazide (HYDRODIURIL) 25 MG tablet  Take 1 tablet (25 mg total) by mouth daily. 07/10/15  Yes Lacretia Leigh, MD  Insulin Glargine (LANTUS SOLOSTAR) 100 UNIT/ML Solostar Pen Inject 10 Units into the skin daily at 10 pm. 07/13/15  Yes Oswald Hillock, MD  Insulin Pen Needle (PEN NEEDLES 3/16") 31G X 5 MM MISC 10 Units by Does not apply route daily at 10 pm. 07/13/15  Yes Oswald Hillock, MD  isosorbide mononitrate (IMDUR) 60 MG 24 hr tablet Take 1 tablet (60 mg total) by mouth daily. 07/10/15  Yes Lacretia Leigh, MD  lisinopril (PRINIVIL,ZESTRIL) 20 MG tablet Take 1 tablet (20 mg total) by mouth 2 (two) times daily. 07/10/15  Yes Lacretia Leigh, MD  metFORMIN (GLUCOPHAGE) 500 MG tablet Take 1 tablet (500 mg total) by mouth 2 (two) times daily with a meal. 07/13/15  Yes Oswald Hillock, MD  mupirocin nasal ointment (BACTROBAN) 2 % Place 1 application into the nose daily. Use one-half of tube in each nostril twice daily for five (5) days. After application, press sides of nose together and gently massage.   Yes Historical Provider, MD    Physical Exam: Filed Vitals:   07/19/15 1230 07/19/15 1357 07/19/15 1431 07/19/15 1436  BP: 124/66 118/83 119/74   Pulse: 67 87 74   Temp:   98.2 F (36.8 C)   TempSrc:   Oral   Resp: _0 Height:    _1  (1.651 m)  Weight:    76.975 kg (169 lb 11.2 oz)  SpO2: 96% 97% 100%       Constitutional: NAD, calm, comfortable Filed Vitals:   07/19/15 1230 07/19/15 1357 07/19/15 1431 07/19/15 1436  BP: 124/66 118/83 119/74   Pulse: 67 87 74   Temp:   98.2 F (36.8 C)   TempSrc:   Oral   Resp: _2 Height:    _3  (1.651 m)  Weight:    76.975 kg (169 lb 11.2 oz)  SpO2: 96% 97% 100%    Eyes: PERRL, lids and conjunctivae normal ENMT: Mucous membranes are moist. Posterior pharynx clear of any exudate or lesions.Normal dentition.  Neck: normal, supple, no masses, no thyromegaly Respiratory: clear to auscultation bilaterally, no wheezing, no crackles. Normal respiratory effort. No accessory muscle  use.  Cardiovascular: Regular rate and rhythm, no murmurs / rubs / gallops. No extremity edema. 2+ pedal pulses. No carotid bruits.  Abdomen: no tenderness, no masses palpated. No hepatosplenomegaly. Bowel sounds positive.  Musculoskeletal: no clubbing / cyanosis. No joint deformity upper and lower extremities. Good ROM, no contractures. Normal muscle tone.  Skin: no rashes, lesions, ulcers. No induration Neurologic: CN 2-12 grossly intact. Sensation intact, DTR normal. Strength 5/5 in all 4.  Psychiatric: Normal judgment and insight. Alert and oriented x 3. Normal mood.     Labs on Admission: I have personally reviewed following labs and imaging studies  CBC:  Recent Labs Lab 07/12/15 1621 07/13/15 0200 07/19/15 1046  WBC 7.8 7.2 8.8  NEUTROABS 4.8  --  5.3  HGB 12.2* 11.8* 13.6  HCT 35.9* 33.4* 39.6  MCV 87.1 83.3 86.8  PLT 295 259 203   Basic Metabolic Panel:  Recent Labs Lab 07/12/15 1621 07/12/15 2024 07/13/15 0200 07/13/15 0731 07/19/15 1046  NA 125* 135 136 136 132*  K 3.8 3.2* 2.5* 3.6 4.5  CL 95* 103 105 105 100*  CO2 21* _0 20*  GLUCOSE 778* 273* 197* 220* 411*  BUN 24* 21* 18 16 37*  CREATININE 2.08* 1.53* 1.16 1.18 2.30*  CALCIUM 8.9 9.0 8.7* 8.6* 10.3   GFR: Estimated Creatinine Clearance: 32.3 mL/min (by C-G formula based on Cr of 2.3). Liver Function Tests:  Recent Labs Lab 07/12/15 1621 07/13/15 0200 07/19/15 1046  AST _1 ALT _2 ALKPHOS 90 74 81  BILITOT 1.1 1.1 1.0  PROT 6.8 6.6 8.0  ALBUMIN 3.4* 3.3* 4.0   No results for input(s): LIPASE, AMYLASE in the last 168 hours. No results for input(s): AMMONIA in the last 168 hours. Coagulation Profile: No results for input(s): INR, PROTIME in the last 168 hours. Cardiac Enzymes:  Recent Labs Lab 07/12/15 2024 07/13/15 0200 07/13/15 0731 07/19/15 1046 07/19/15 1344  TROPONINI 0.04* 0.04* 0.04* 0.03 0.03   BNP (last 3 results) No results for input(s): PROBNP in  the last 8760 hours. HbA1C: No results for input(s): HGBA1C in the last 72 hours. CBG:  Recent Labs Lab 07/13/15 0306 07/13/15 0804 07/13/15 1316 07/19/15 1055 07/19/15 1314  GLUCAP 194* 227* 318* 414* 333*   Lipid Profile: No results for input(s): CHOL, HDL, LDLCALC, TRIG, CHOLHDL, LDLDIRECT in the last 72 hours. Thyroid Function Tests: No results for input(s): TSH, T4TOTAL, FREET4, T3FREE, THYROIDAB in the last 72 hours. Anemia Panel: No results for input(s): VITAMINB12, FOLATE, FERRITIN, TIBC, IRON, RETICCTPCT in the last 72 hours. Urine analysis:    Component Value Date/Time   COLORURINE YELLOW 07/19/2015 1331   APPEARANCEUR CLEAR 07/19/2015 1331   LABSPEC 1.023 07/19/2015 1331   PHURINE 5.5 07/19/2015 1331   GLUCOSEU >1000* 07/19/2015 1331   HGBUR NEGATIVE 07/19/2015 1331   BILIRUBINUR NEGATIVE 07/19/2015 1331   KETONESUR NEGATIVE 07/19/2015 1331   PROTEINUR NEGATIVE 07/19/2015 1331   UROBILINOGEN 0.2 02/12/2011 0804   NITRITE NEGATIVE 07/19/2015 1331   LEUKOCYTESUR NEGATIVE 07/19/2015 1331   Sepsis Labs: !!!!!!!!!!!!!!!!!!!!!!!!!!!!!!!!!!!!!!!!!!!! _3 (procalcitonin:4,lacticidven:4) ) Recent Results (from the past 240 hour(s))  MRSA PCR Screening     Status: Abnormal   Collection Time: 07/12/15  9:55 PM  Result Value Ref Range Status   MRSA by PCR POSITIVE (A) NEGATIVE Final    Comment:        The GeneXpert MRSA Assay (FDA approved for NASAL specimens only), is one component of a comprehensive MRSA colonization surveillance program. It is not intended to diagnose MRSA infection nor to guide or monitor treatment for MRSA infections. RESULT CALLED TO, READ BACK BY AND VERIFIED WITH: Oretha Milch [id number] @ 2311 BY J SCOTTON      Radiological Exams on Admission: Dg Chest 2 View  07/19/2015  CLINICAL DATA:  Dyspnea EXAM: CHEST  2 VIEW COMPARISON:  07/12/2015 FINDINGS: The heart size and mediastinal contours are within normal limits. Both lungs  are clear. The visualized skeletal structures are unremarkable. IMPRESSION: No active cardiopulmonary disease. Electronically Signed   By: Franchot Gallo M.D.   On: 07/19/2015 11:41  EKG: T wave inversion V 5 and V 5.   Assessment/Plan Principal Problem:   Dyspnea Active Problems:   Diabetes type 2, uncontrolled (HCC)   HYPERCHOLESTEROLEMIA   Chronic systolic heart failure (HCC)   CKD (chronic kidney disease) stage 2, GFR 60-89 ml/min   Acute kidney injury ([place])   AKI (acute kidney injury) ([place])  1-Dyspnea; Presents with dyspnea at rest and on exertion. He dose not appears to be in Heart failure,chest x ray negative for pulmonary edema, lung exam clear.  -EKG with T wave changes. Cycle enzymes. Repeat ECHO.  -Depending on results might need cardiology consultation.  -D dimer negative.  -UDS ordered.   2-AKI on chronic KD stage II; suspect related to decrease volume.  IV fluids. Hold ACE and metformin, HCTZ Weight 169,he use to weight 180.  3-Diabetes ,uncontrolled; continue with lantus. SSI.   4-Cardiomyopathy, Chronic Systolic HF,compensated ; Continue with coreg, Imdur.  Weight 169,he use to weight 180.  5-Hyponatremia; IV fluids.    DVT prophylaxis: lovenox. Code Status: Full code.  Family Communication: daughter at bedside,  Disposition Plan: home in 24 to 48 hours.  Consults called: none Admission status: observation.    Elmarie Shiley MD [facility] Pager [phone]  If 7PM-7AM, please contact night-coverage [email or web address] Password [facility]  07/19/2015, 2:58 PM

## 2015-07-19 NOTE — ED Notes (Addendum)
Pt c/o SOB starting this morning.  Denies pain.  Hx of CHF.  Pt followed by Gwynneth Aliment MD.  Pt reports taking all medications as prescribed.  Denies new swelling.

## 2015-07-19 NOTE — Progress Notes (Signed)
  Echocardiogram 2D Echocardiogram has been performed.  Diamond Nickel 07/19/2015, 3:51 PM

## 2015-07-19 NOTE — ED Notes (Signed)
Patient transported to X-ray 

## 2015-07-20 DIAGNOSIS — I5022 Chronic systolic (congestive) heart failure: Secondary | ICD-10-CM

## 2015-07-20 DIAGNOSIS — N182 Chronic kidney disease, stage 2 (mild): Secondary | ICD-10-CM | POA: Diagnosis not present

## 2015-07-20 DIAGNOSIS — E1165 Type 2 diabetes mellitus with hyperglycemia: Secondary | ICD-10-CM

## 2015-07-20 DIAGNOSIS — R0600 Dyspnea, unspecified: Secondary | ICD-10-CM

## 2015-07-20 DIAGNOSIS — E118 Type 2 diabetes mellitus with unspecified complications: Secondary | ICD-10-CM

## 2015-07-20 DIAGNOSIS — N179 Acute kidney failure, unspecified: Secondary | ICD-10-CM

## 2015-07-20 LAB — CBC
HCT: 34.1 % — ABNORMAL LOW (ref 39.0–52.0)
Hemoglobin: 11.6 g/dL — ABNORMAL LOW (ref 13.0–17.0)
MCH: 30.1 pg (ref 26.0–34.0)
MCHC: 34 g/dL (ref 30.0–36.0)
MCV: 88.3 fL (ref 78.0–100.0)
Platelets: 277 10*3/uL (ref 150–400)
RBC: 3.86 MIL/uL — ABNORMAL LOW (ref 4.22–5.81)
RDW: 13 % (ref 11.5–15.5)
WBC: 7.9 10*3/uL (ref 4.0–10.5)

## 2015-07-20 LAB — BASIC METABOLIC PANEL
Anion gap: 6 (ref 5–15)
BUN: 34 mg/dL — ABNORMAL HIGH (ref 6–20)
CO2: 21 mmol/L — ABNORMAL LOW (ref 22–32)
Calcium: 9.3 mg/dL (ref 8.9–10.3)
Chloride: 104 mmol/L (ref 101–111)
Creatinine, Ser: 1.6 mg/dL — ABNORMAL HIGH (ref 0.61–1.24)
GFR calc Af Amer: 52 mL/min — ABNORMAL LOW (ref >60)
GFR calc non Af Amer: 45 mL/min — ABNORMAL LOW (ref >60)
Glucose, Bld: 329 mg/dL — ABNORMAL HIGH (ref 65–99)
Potassium: 3.9 mmol/L (ref 3.5–5.1)
Sodium: 131 mmol/L — ABNORMAL LOW (ref 135–145)

## 2015-07-20 LAB — GLUCOSE, CAPILLARY
Glucose-Capillary: 269 mg/dL — ABNORMAL HIGH (ref 65–99)
Glucose-Capillary: 329 mg/dL — ABNORMAL HIGH (ref 65–99)
Glucose-Capillary: 253 mg/dL — ABNORMAL HIGH (ref 65–99)
Glucose-Capillary: 309 mg/dL — ABNORMAL HIGH (ref 65–99)
Glucose-Capillary: 278 mg/dL — ABNORMAL HIGH (ref 65–99)

## 2015-07-20 LAB — TROPONIN I: Troponin I: 0.03 ng/mL (ref <0.031)

## 2015-07-20 MED ORDER — INSULIN ASPART 100 UNIT/ML ~~LOC~~ SOLN
5.0000 [IU] | Freq: Once | SUBCUTANEOUS | Status: AC
  Administered 2015-07-20: 5 [IU] via SUBCUTANEOUS

## 2015-07-20 MED ORDER — GLUCERNA 1.2 CAL PO LIQD
237.0000 mL | Freq: Every day | ORAL | Status: DC
  Administered 2015-07-21: 237 mL via ORAL
  Filled 2015-07-20 (×2): qty 237

## 2015-07-20 NOTE — Progress Notes (Signed)
Inpatient Diabetes Program Recommendations  AACE/ADA: New Consensus Statement on Inpatient Glycemic Control (2015)  Target Ranges:  Prepandial:   less than 140 mg/dL      Peak postprandial:   less than 180 mg/dL (1-2 hours)      Critically ill patients:  140 - 180 mg/dL   Lab Results  Component Value Date   GLUCAP 329* 07/20/2015   HGBA1C 13.8* 07/13/2015    Review of Glycemic Control  Diabetes history: DM2 Outpatient Diabetes medications: Lantus 10 units QHS Current orders for Inpatient glycemic control: Lantus 10 units QHS, Novolog sensitive tidwc  Inpatient Diabetes Program Recommendations:    Increase Lantus to 15 units QHS. Add Novolog 4 units tidwc for meal coverage insulin. Add HS correction.  Long discussion with pt regarding his glycemic control. Pt states he did not miss an insulin dose, but was unable to get meter and strips at drugstore. States he will get [employer] meter and supplies, whether his insurance pays or not. Discussed taking Novolog s/s at meals and pt was willing to do this. He knows he will need meter in order to take Novolog s/s. Encouraged pt to f/u with PCP within 1 week of discharge.   Will follow. Thank you. Lorenda Peck, RD, LDN, CDE Inpatient Diabetes Coordinator [phone]

## 2015-07-20 NOTE — Progress Notes (Signed)
PROGRESS NOTE    [name]  [id number] DOB: 29-Aug-1955 DOA: 07/19/2015 PCP: Mauricio Po, FNP   Brief Narrative:  HPI on 07/19/2015 by Dr. Justice Deeds is a 60 y.o. male with medical history significant for Cardiomyopathy last EF 45 % by ECHO 2015, DM uncontrolled, recently discharge from hospital on 6-16 treated at that time for hyperglycemia, dyspnea. During last admission his troponin was mildly elevated,case was discussed with cardiology at that time and patient needed to follow up with cardio outpatient. He presents today complaining of SOB that started again 2 days prior to admission.SOB at rest and on exertion. He denies chest pain. He report complain with diet and medications.   Assessment & Plan  Dyspnea -Presented with dyspnea at rest and on exertion.  -BNP 11 -Currently euvolemic -Echocardiogram EF of 0000000, grade 1 diastolic dysfunction -EKG had mild T-wave changes -Troponin cycled and on remarkable -D-dimer less than 0.27 -Toxicology screen negative -Dyspnea seems to have resolved   Acute kidney injury on CKD, stage III  -suspect related to decrease volume/dehydration  -Creatinine upon admission was 2.3, currently 1.6 -Placed on IVF  -Metformin, lisinopril, HCTZ held -Continue to monitor BMP  Diabetes mellitus, type II -Uncontrolled, continue Lantus and insulin sliding scale -Diabetes coordinator and nutrition consulted -Hemoglobin A1c on 07/13/2015 was 13.8 -Metformin held  History of cardiomyopathy, chronic systolic heart failure -In 2015, echocardiogram showed an EF of 40-45% -Echocardiogram yesterday showed EF of 55-60% -Currently euvolemic, monitor intake and output, daily weights -Lisinopril HCTZ held due to acute kidney injury -Continue Coreg and Imdur  Hyponatremia -Continue IVF, monitor BMP -Likely pseudohyponatremia given hyperglycemia  DVT Prophylaxis  lovenox  Code Status: Full  Family Communication: None  at bedside  Disposition Plan: Admitted for observation. Discharge when Blood sugars more controlled.  Consultants None  Procedures  Echocardiogram  Antibiotics   Anti-infectives    None      Subjective:   [name] seen and examined today.  Patient states he is feeling much better. Denies any shortness of breath, chest pain at this time. Denies any abdominal pain, nausea, vomiting, diarrhea or constipation. States that he was not able to see his physician after his last discharge.    Objective:   Filed Vitals:   07/19/15 1436 07/19/15 2247 07/20/15 0456 07/20/15 0708  BP:  118/68 145/93 149/82  Pulse:  86 74 72  Temp:  98.2 F (36.8 C) 97.9 F (36.6 C)   TempSrc:  Oral Oral   Resp:  18 20   Height: 5\' 5"  (1.651 m)     Weight: 76.975 kg (169 lb 11.2 oz)  76.749 kg (169 lb 3.2 oz)   SpO2:  99% 99%     Intake/Output Summary (Last 24 hours) at 07/20/15 1314 Last data filed at 07/20/15 0935  Gross per 24 hour  Intake 2338.75 ml  Output   1350 ml  Net 988.75 ml   Filed Weights   07/19/15 1436 07/20/15 0456  Weight: 76.975 kg (169 lb 11.2 oz) 76.749 kg (169 lb 3.2 oz)    Exam  General: Well developed, well nourished, NAD, appears stated age  HEENT: NCAT, mucous membranes moist.   Cardiovascular: S1 S2 auscultated, no murmurs, RRR  Respiratory: Clear to auscultation bilaterally  Abdomen: Soft, nontender, nondistended, + bowel sounds  Extremities: warm dry without cyanosis clubbing or edema  Neuro: AAOx3, nonfocal  Psych: Normal affect and demeanor   Data Reviewed: I have personally reviewed following labs and imaging  studies  CBC:  Recent Labs Lab 07/19/15 1046 07/20/15 0114  WBC 8.8 7.9  NEUTROABS 5.3  --   HGB 13.6 11.6*  HCT 39.6 34.1*  MCV 86.8 88.3  PLT 347 [id number]   Basic Metabolic Panel:  Recent Labs Lab 07/19/15 1046 07/20/15 0114  NA 132* 131*  K 4.5 3.9  CL 100* 104  CO2 20* 21*  GLUCOSE 411* 329*  BUN 37* 34*    CREATININE 2.30* 1.60*  CALCIUM 10.3 9.3   GFR: Estimated Creatinine Clearance: 46.4 mL/min (by C-G formula based on Cr of 1.6). Liver Function Tests:  Recent Labs Lab 07/19/15 1046  AST 30  ALT 31  ALKPHOS 81  BILITOT 1.0  PROT 8.0  ALBUMIN 4.0   No results for input(s): LIPASE, AMYLASE in the last 168 hours. No results for input(s): AMMONIA in the last 168 hours. Coagulation Profile: No results for input(s): INR, PROTIME in the last 168 hours. Cardiac Enzymes:  Recent Labs Lab 07/19/15 1046 07/19/15 1344 07/19/15 1934 07/20/15 0114  TROPONINI 0.03 0.03 0.03 0.03   BNP (last 3 results) No results for input(s): PROBNP in the last 8760 hours. HbA1C: No results for input(s): HGBA1C in the last 72 hours. CBG:  Recent Labs Lab 07/19/15 1314 07/19/15 1621 07/19/15 2246 07/20/15 0731 07/20/15 1139  GLUCAP 333* 339* 294* 269* 329*   Lipid Profile: No results for input(s): CHOL, HDL, LDLCALC, TRIG, CHOLHDL, LDLDIRECT in the last 72 hours. Thyroid Function Tests: No results for input(s): TSH, T4TOTAL, FREET4, T3FREE, THYROIDAB in the last 72 hours. Anemia Panel: No results for input(s): VITAMINB12, FOLATE, FERRITIN, TIBC, IRON, RETICCTPCT in the last 72 hours. Urine analysis:    Component Value Date/Time   COLORURINE YELLOW 07/19/2015 1331   APPEARANCEUR CLEAR 07/19/2015 1331   LABSPEC 1.023 07/19/2015 1331   PHURINE 5.5 07/19/2015 1331   GLUCOSEU >1000* 07/19/2015 1331   HGBUR NEGATIVE 07/19/2015 1331   BILIRUBINUR NEGATIVE 07/19/2015 1331   KETONESUR NEGATIVE 07/19/2015 1331   PROTEINUR NEGATIVE 07/19/2015 1331   UROBILINOGEN 0.2 02/12/2011 0804   NITRITE NEGATIVE 07/19/2015 1331   LEUKOCYTESUR NEGATIVE 07/19/2015 1331   Sepsis Labs: @LABRCNTIP (procalcitonin:4,lacticidven:4)  ) Recent Results (from the past 240 hour(s))  MRSA PCR Screening     Status: Abnormal   Collection Time: 07/12/15  9:55 PM  Result Value Ref Range Status   MRSA by PCR  POSITIVE (A) NEGATIVE Final    Comment:        The GeneXpert MRSA Assay (FDA approved for NASAL specimens only), is one component of a comprehensive MRSA colonization surveillance program. It is not intended to diagnose MRSA infection nor to guide or monitor treatment for MRSA infections. RESULT CALLED TO, READ BACK BY AND VERIFIED WITH: Oretha Milch [id number] @ 2311 BY J SCOTTON       Radiology Studies: Dg Chest 2 View  07/19/2015  CLINICAL DATA:  Dyspnea EXAM: CHEST  2 VIEW COMPARISON:  07/12/2015 FINDINGS: The heart size and mediastinal contours are within normal limits. Both lungs are clear. The visualized skeletal structures are unremarkable. IMPRESSION: No active cardiopulmonary disease. Electronically Signed   By: Franchot Gallo M.D.   On: 07/19/2015 11:41     Scheduled Meds: . carvedilol  12.5 mg Oral QAC breakfast  . feeding supplement (GLUCERNA 1.2 CAL)  237 mL Oral Daily  . heparin  5,000 Units Subcutaneous Q8H  . insulin aspart  0-9 Units Subcutaneous TID WC  . insulin glargine  10 Units Subcutaneous Q2200  .  isosorbide mononitrate  60 mg Oral Daily  . sodium chloride flush  3 mL Intravenous Q12H   Continuous Infusions: . sodium chloride 75 mL/hr at 07/20/15 0500       Time Spent in minutes   30 minutes  Lavel Rieman D.O. on 07/20/2015 at 1:14 PM  Between 7am to 7pm - Pager - [phone]  After 7pm go to [email or web address] - password [email or web address]  And look for the night coverage person covering for me after hours  [facility] Office  [phone]

## 2015-07-20 NOTE — Progress Notes (Signed)
Initial Nutrition Assessment  DOCUMENTATION CODES:   Not applicable  INTERVENTION:  -Glucerna Shake po daily, each supplement provides 220 kcal and 10 grams of protein -RD to continue to monitor  NUTRITION DIAGNOSIS:   Inadequate oral intake related to acute illness, chronic illness, poor appetite as evidenced by per patient/family report.  GOAL:   Patient will meet greater than or equal to 90% of their needs  MONITOR:   PO intake, Supplement acceptance, Labs, I & O's, Skin  REASON FOR ASSESSMENT:   Malnutrition Screening Tool    ASSESSMENT:   [name] is a 60 y.o. male with medical history significant for Cardiomyopathy last EF 45 % by ECHO 2015, DM uncontrolled, recently discharge from hospital on 6-16 treated at that time for hyperglycemia, dyspnea. During last admission his troponin was mildly elevated,case was discussed with cardiology at that time and patient needed to follow up with cardio outpatient.  Spoke with Mr. Kain at bedside. He used to work in [employer] here at [employer]. He endorses good appetite, and good PO intake during stay. Had breakfast potatoes, french toast, grits, and eggs this morning he claims he ate most of. He endorses wt loss. States he was 180# 2 months ago, now 169. Reported wt loss would be 11#/6% insignificant wt loss in 2 months. Per chart he exhibits a 27#/13% insignificant wt loss in 8 months. He feels he was losing weight related to not having his medications for T2DM. Denies issues with appetite PTA. He is agreeable to drinking 1 glucerna per day but does not want 3.  Nutrition-Focused physical exam completed. Findings are no fat depletion, no muscle depletion, and no edema.   Labs and Medications reviewed: CBG 269;Na 131;   Diet Order:  Diet Carb Modified Fluid consistency:: Thin; Room service appropriate?: Yes  Skin:  Reviewed, no issues  Last BM:  6/22  Height:   Ht Readings from Last 1 Encounters:  07/19/15 5'  5" (1.651 m)    Weight:   Wt Readings from Last 1 Encounters:  07/20/15 169 lb 3.2 oz (76.749 kg)    Ideal Body Weight:  59.09 kg  BMI:  Body mass index is 28.16 kg/(m^2).  Estimated Nutritional Needs:   Kcal:  1600-1900 calories  Protein:  75-90 grams  Fluid:  >/= 1.6 L  EDUCATION NEEDS:   No education needs identified at this time  Satira Anis. Ruby Dilone, MS, RD LDN Inpatient Clinical Dietitian Pager [phone]

## 2015-07-21 DIAGNOSIS — I5022 Chronic systolic (congestive) heart failure: Secondary | ICD-10-CM | POA: Diagnosis not present

## 2015-07-21 DIAGNOSIS — N182 Chronic kidney disease, stage 2 (mild): Secondary | ICD-10-CM | POA: Diagnosis not present

## 2015-07-21 DIAGNOSIS — N179 Acute kidney failure, unspecified: Secondary | ICD-10-CM | POA: Diagnosis not present

## 2015-07-21 DIAGNOSIS — R0600 Dyspnea, unspecified: Secondary | ICD-10-CM | POA: Diagnosis not present

## 2015-07-21 LAB — CBC
HCT: 30.9 % — ABNORMAL LOW (ref 39.0–52.0)
Hemoglobin: 10.6 g/dL — ABNORMAL LOW (ref 13.0–17.0)
MCH: 29.5 pg (ref 26.0–34.0)
MCHC: 34.3 g/dL (ref 30.0–36.0)
MCV: 86.1 fL (ref 78.0–100.0)
Platelets: 271 10*3/uL (ref 150–400)
RBC: 3.59 MIL/uL — ABNORMAL LOW (ref 4.22–5.81)
RDW: 12.5 % (ref 11.5–15.5)
WBC: 6.8 10*3/uL (ref 4.0–10.5)

## 2015-07-21 LAB — BASIC METABOLIC PANEL
Anion gap: 6 (ref 5–15)
BUN: 22 mg/dL — ABNORMAL HIGH (ref 6–20)
CO2: 25 mmol/L (ref 22–32)
Calcium: 8.8 mg/dL — ABNORMAL LOW (ref 8.9–10.3)
Chloride: 105 mmol/L (ref 101–111)
Creatinine, Ser: 1.26 mg/dL — ABNORMAL HIGH (ref 0.61–1.24)
GFR calc Af Amer: >60 mL/min (ref >60)
GFR calc non Af Amer: 60 mL/min — ABNORMAL LOW (ref >60)
Glucose, Bld: 225 mg/dL — ABNORMAL HIGH (ref 65–99)
Potassium: 3.8 mmol/L (ref 3.5–5.1)
Sodium: 136 mmol/L (ref 135–145)

## 2015-07-21 LAB — GLUCOSE, CAPILLARY: Glucose-Capillary: 223 mg/dL — ABNORMAL HIGH (ref 65–99)

## 2015-07-21 MED ORDER — INSULIN GLARGINE 100 UNIT/ML ~~LOC~~ SOLN: 15.0000 [IU] | Freq: Every day | SUBCUTANEOUS | Status: DC

## 2015-07-21 MED ORDER — INSULIN GLARGINE 100 UNIT/ML SOLOSTAR PEN: 15.0000 [IU] | PEN_INJECTOR | Freq: Every day | SUBCUTANEOUS | Status: DC

## 2015-07-21 MED ORDER — INSULIN ASPART 100 UNIT/ML ~~LOC~~ SOLN
4.0000 [IU] | Freq: Three times a day (TID) | SUBCUTANEOUS | Status: DC
  Administered 2015-07-21: 4 [IU] via SUBCUTANEOUS

## 2015-07-21 MED ORDER — BLOOD GLUCOSE METER KIT: PACK | Status: DC

## 2015-07-21 MED ORDER — GLUCERNA 1.2 CAL PO LIQD: 237.0000 mL | Freq: Every day | ORAL | Status: DC

## 2015-07-21 NOTE — Discharge Instructions (Signed)
Acute Kidney Injury Acute kidney injury is any condition in which there is sudden (acute) damage to the kidneys. Acute kidney injury was previously known as acute kidney failure or acute renal failure. The kidneys are two organs that lie on either side of the spine between the middle of the back and the front of the abdomen. The kidneys:  Remove wastes and extra water from the blood.   Produce important hormones. These help keep bones strong, regulate blood pressure, and help create red blood cells.   Balance the fluids and chemicals in the blood and tissues. A small amount of kidney damage may not cause problems, but a large amount of damage may make it difficult or impossible for the kidneys to work the way they should. Acute kidney injury may develop into long-lasting (chronic) kidney disease. It may also develop into a life-threatening disease called end-stage kidney disease. Acute kidney injury can get worse very quickly, so it should be treated right away. Early treatment may prevent other kidney diseases from developing. CAUSES   A problem with blood flow to the kidneys. This may be caused by:   Blood loss.   Heart disease.   Severe burns.   Liver disease.  Direct damage to the kidneys. This may be caused by:  Some medicines.   A kidney infection.   Poisoning or consuming toxic substances.   A surgical wound.   A blow to the kidney area.   A problem with urine flow. This may be caused by:   Cancer.   Kidney stones.   An enlarged prostate. SIGNS AND SYMPTOMS   Swelling (edema) of the legs, ankles, or feet.   Tiredness (lethargy).   Nausea or vomiting.   Confusion.   Problems with urination, such as:   Painful or burning feeling during urination.   Decreased urine production.   Frequent accidents in children who are potty trained.   Bloody urine.   Muscle twitches and cramps.   Shortness of breath.   Seizures.   Chest  pain or pressure. Sometimes, no symptoms are present. DIAGNOSIS Acute kidney injury may be detected and diagnosed by tests, including blood, urine, imaging, or kidney biopsy tests.  TREATMENT Treatment of acute kidney injury varies depending on the cause and severity of the kidney damage. In mild cases, no treatment may be needed. The kidneys may heal on their own. If acute kidney injury is more severe, your health care provider will treat the cause of the kidney damage, help the kidneys heal, and prevent complications from occurring. Severe cases may require a procedure to remove toxic wastes from the body (dialysis) or surgery to repair kidney damage. Surgery may involve:   Repair of a torn kidney.   Removal of an obstruction. HOME CARE INSTRUCTIONS  Follow your prescribed diet.  Take medicines only as directed by your health care provider.  Do not take any new medicines (prescription, over-the-counter, or nutritional supplements) unless approved by your health care provider. Many medicines can worsen your kidney damage or may need to have the dose adjusted.   Keep all follow-up visits as directed by your health care provider. This is important.  Observe your condition to make sure you are healing as expected. SEEK IMMEDIATE MEDICAL CARE IF:  You are feeling ill or have severe pain in the back or side.   Your symptoms return or you have new symptoms.  You have any symptoms of end-stage kidney disease. These include:   Persistent itchiness.  Loss of appetite.   Headaches.   Abnormally dark or light skin.  Numbness in the hands or feet.   Easy bruising.   Frequent hiccups.   Menstruation stops.   You have a fever.  You have increased urine production.  You have pain or bleeding when urinating. MAKE SURE YOU:   Understand these instructions.  Will watch your condition.  Will get help right away if you are not doing well or get worse.   This  information is not intended to replace advice given to you by your health care provider. Make sure you discuss any questions you have with your health care provider.   Document Released: 07/29/2010 Document Revised: 02/03/2014 Document Reviewed: 09/12/2011 Elsevier Interactive Patient Education 2016 Elsevier [employer] Type 2 Diabetes Mellitus, Adult Type 2 diabetes mellitus, often simply referred to as type 2 diabetes, is a long-lasting (chronic) disease. In type 2 diabetes, the pancreas does not make enough insulin (a hormone), the cells are less responsive to the insulin that is made (insulin resistance), or both. Normally, insulin moves sugars from food into the tissue cells. The tissue cells use the sugars for energy. The lack of insulin or the lack of normal response to insulin causes excess sugars to build up in the blood instead of going into the tissue cells. As a result, high blood sugar (hyperglycemia) develops. The effect of high sugar (glucose) levels can cause many complications. Type 2 diabetes was also previously called adult-onset diabetes, but it can occur at any age.  RISK FACTORS  A person is predisposed to developing type 2 diabetes if someone in the family has the disease and also has one or more of the following primary risk factors:  Weight gain, or being overweight or obese.  An inactive lifestyle.  A history of consistently eating high-calorie foods. Maintaining a normal weight and regular physical activity can reduce the chance of developing type 2 diabetes. SYMPTOMS  A person with type 2 diabetes may not show symptoms initially. The symptoms of type 2 diabetes appear slowly. The symptoms include:  Increased thirst (polydipsia).  Increased urination (polyuria).  Increased urination during the night (nocturia).  Sudden or unexplained weight changes.  Frequent, recurring infections.  Tiredness (fatigue).  Weakness.  Vision changes, such as blurred  vision.  Fruity smell to your breath.  Abdominal pain.  Nausea or vomiting.  Cuts or bruises which are slow to heal.  Tingling or numbness in the hands or feet.  An open skin wound (ulcer). DIAGNOSIS Type 2 diabetes is frequently not diagnosed until complications of diabetes are present. Type 2 diabetes is diagnosed when symptoms or complications are present and when blood glucose levels are increased. Your blood glucose level may be checked by one or more of the following blood tests:  A fasting blood glucose test. You will not be allowed to eat for at least 8 hours before a blood sample is taken.  A random blood glucose test. Your blood glucose is checked at any time of the day regardless of when you ate.  A hemoglobin A1c blood glucose test. A hemoglobin A1c test provides information about blood glucose control over the previous 3 months.  An oral glucose tolerance test (OGTT). Your blood glucose is measured after you have not eaten (fasted) for 2 hours and then after you drink a glucose-containing beverage. TREATMENT   You may need to take insulin or diabetes medicine daily to keep blood glucose levels in the desired range.  If you  use insulin, you may need to adjust the dosage depending on the carbohydrates that you eat with each meal or snack.  Lifestyle changes are recommended as part of your treatment. These may include:  Following an individualized diet plan developed by a nutritionist or dietitian.  Exercising daily. Your health care providers will set individualized treatment goals for you based on your age, your medicines, how long you have had diabetes, and any other medical conditions you have. Generally, the goal of treatment is to maintain the following blood glucose levels:  Before meals (preprandial): 80-130 mg/dL.  After meals (postprandial): below 180 mg/dL.  A1c: less than 6.5-7%. HOME CARE INSTRUCTIONS   Have your hemoglobin A1c level checked twice a  year.  Perform daily blood glucose monitoring as directed by your health care provider.  Monitor urine ketones when you are ill and as directed by your health care provider.  Take your diabetes medicine or insulin as directed by your health care provider to maintain your blood glucose levels in the desired range.  Never run out of diabetes medicine or insulin. It is needed every day.  If you are using insulin, you may need to adjust the amount of insulin given based on your intake of carbohydrates. Carbohydrates can raise blood glucose levels but need to be included in your diet. Carbohydrates provide vitamins, minerals, and fiber which are an essential part of a healthy diet. Carbohydrates are found in fruits, vegetables, whole grains, dairy products, legumes, and foods containing added sugars.  Eat healthy foods. You should make an appointment to see a registered dietitian to help you create an eating plan that is right for you.  Lose weight if you are overweight.  Carry a medical alert card or wear your medical alert jewelry.  Carry a 15-gram carbohydrate snack with you at all times to treat low blood glucose (hypoglycemia). Some examples of 15-gram carbohydrate snacks include:  Glucose tablets, 3 or 4.  Glucose gel, 15-gram tube.  Raisins, 2 tablespoons (24 grams).  Jelly beans, 6.  Animal crackers, 8.  Regular pop, 4 ounces (120 mL).  Gummy treats, 9.  Recognize hypoglycemia. Hypoglycemia occurs with blood glucose levels of 70 mg/dL and below. The risk for hypoglycemia increases when fasting or skipping meals, during or after intense exercise, and during sleep. Hypoglycemia symptoms can include:  Tremors or shakes.  Decreased ability to concentrate.  Sweating.  Increased heart rate.  Headache.  Dry mouth.  Hunger.  Irritability.  Anxiety.  Restless sleep.  Altered speech or coordination.  Confusion.  Treat hypoglycemia promptly. If you are alert and  able to safely swallow, follow the 15:15 rule:  Take 15-20 grams of rapid-acting glucose or carbohydrate. Rapid-acting options include glucose gel, glucose tablets, or 4 ounces (120 mL) of fruit juice, regular soda, or low-fat milk.  Check your blood glucose level 15 minutes after taking the glucose.  Take 15-20 grams more of glucose if the repeat blood glucose level is still 70 mg/dL or below.  Eat a meal or snack within 1 hour once blood glucose levels return to normal.  Be alert to feeling very thirsty and urinating more frequently than usual, which are early signs of hyperglycemia. An early awareness of hyperglycemia allows for prompt treatment. Treat hyperglycemia as directed by your health care provider.  Engage in at least 150 minutes of moderate-intensity physical activity a week, spread over at least 3 days of the week or as directed by your health care provider. In addition, you  should engage in resistance exercise at least 2 times a week or as directed by your health care provider. Try to spend no more than 90 minutes at one time inactive.  Adjust your medicine and food intake as needed if you start a new exercise or sport.  Follow your sick-day plan anytime you are unable to eat or drink as usual.  Do not use any tobacco products including cigarettes, chewing tobacco, or electronic cigarettes. If you need help quitting, ask your health care provider.  Limit alcohol intake to no more than 1 drink per day for nonpregnant women and 2 drinks per day for men. You should drink alcohol only when you are also eating food. Talk with your health care provider whether alcohol is safe for you. Tell your health care provider if you drink alcohol several times a week.  Keep all follow-up visits as directed by your health care provider. This is important.  Schedule an eye exam soon after the diagnosis of type 2 diabetes and then annually.  Perform daily skin and foot care. Examine your skin  and feet daily for cuts, bruises, redness, nail problems, bleeding, blisters, or sores. A foot exam by a health care provider should be done annually.  Brush your teeth and gums at least twice a day and floss at least once a day. Follow up with your dentist regularly.  Share your diabetes management plan with your workplace or school.  Keep your immunizations up to date. It is recommended that you receive a flu (influenza) vaccine every year. It is also recommended that you receive a pneumonia (pneumococcal) vaccine. If you are 22 years of age or older and have never received a pneumonia vaccine, this vaccine may be given as a series of two separate shots. Ask your health care provider which additional vaccines may be recommended.  Learn to manage stress.  Obtain ongoing diabetes education and support as needed.  Participate in or seek rehabilitation as needed to maintain or improve independence and quality of life. Request a physical or occupational therapy referral if you are having foot or hand numbness, or difficulties with grooming, dressing, eating, or physical activity. SEEK MEDICAL CARE IF:   You are unable to eat food or drink fluids for more than 6 hours.  You have nausea and vomiting for more than 6 hours.  Your blood glucose level is over 240 mg/dL.  There is a change in mental status.  You develop an additional serious illness.  You have diarrhea for more than 6 hours.  You have been sick or have had a fever for a couple of days and are not getting better.  You have pain during any physical activity.  SEEK IMMEDIATE MEDICAL CARE IF:  You have difficulty breathing.  You have moderate to large ketone levels.   This information is not intended to replace advice given to you by your health care provider. Make sure you discuss any questions you have with your health care provider.   Document Released: 01/13/2005 Document Revised: 10/04/2014 Document Reviewed:  08/12/2011 Elsevier Interactive Patient Education [employer].

## 2015-07-21 NOTE — Discharge Summary (Signed)
Physician Discharge Summary  [name] MEB:[id number] DOB: 22-Sep-1955 DOA: 07/19/2015  PCP: Mauricio Po, FNP  Admit date: 07/19/2015 Discharge date: 07/21/2015  Time spent: 45 minutes  Recommendations for Outpatient Follow-up:  Patient will be discharged to home.  Patient will need to follow up with primary care provider within one week of discharge, repeat BMP and discuss diabetes management. Patient should continue medications as prescribed.  Patient should follow a heart healthy/carb modified diet.   Discharge Diagnoses:  Dyspnea  Acute kidney injury on CK D, stage III Diabetes mellitus, type II History of cardia myopathy/chronic systolic heart failure Hyponatremia  Discharge Condition: Stable  Diet recommendation: Heart healthy/carb modified  Filed Weights   07/19/15 1436 07/20/15 0456 07/21/15 0605  Weight: 76.975 kg (169 lb 11.2 oz) 76.749 kg (169 lb 3.2 oz) 79.107 kg (174 lb 6.4 oz)    History of present illness:  on 07/19/2015 by Dr. Justice Deeds is a 60 y.o. male with medical history significant for Cardiomyopathy last EF 45 % by ECHO 2015, DM uncontrolled, recently discharge from hospital on 6-16 treated at that time for hyperglycemia, dyspnea. During last admission his troponin was mildly elevated,case was discussed with cardiology at that time and patient needed to follow up with cardio outpatient. He presents today complaining of SOB that started again 2 days prior to admission.SOB at rest and on exertion. He denies chest pain. He report complain with diet and medications.   Hospital Course:  Dyspnea -Presented with dyspnea at rest and on exertion.  -BNP 11 -Currently euvolemic -Echocardiogram EF of 80-88%, grade 1 diastolic dysfunction -EKG had mild T-wave changes -Troponin cycled and on remarkable -D-dimer less than 0.27 -Toxicology screen negative -Dyspnea seems to have resolved   Acute kidney injury on CKD, stage III   -suspect related to decrease volume/dehydration  -Creatinine upon admission was 2.3, currently 1.2 -Placed on IVF  -Metformin, lisinopril, HCTZ held- may resume at discharge -Recommended patient follow up with his PCP next week and have repeat BMP  Diabetes mellitus, type II -Uncontrolled, during hospitalization, was placed on lantus and insulin sliding scale -Diabetes coordinator and nutrition consulted -Hemoglobin A1c on 07/13/2015 was 13.8 -Increased lantus to 15u daily -Metformin held, continue at discharge (per patient's wishes) -Discussed insulin sliding scale with patient, he is very reluctant in regards to both long/short acting insulin.  He would like to continue metformin with an increase in Lantus and follow up with his PCP. -Discussed diet and blood sugar monitoring with patient.  History of cardiomyopathy, chronic systolic heart failure -In 2015, echocardiogram showed an EF of 40-45% -Echocardiogram yesterday showed EF of 55-60% -Currently euvolemic, monitor intake and output, daily weights -Lisinopril HCTZ held due to acute kidney injury -Continue Coreg and Imdur  Hyponatremia -resolved -repeat BMP in one week -Likely pseudohyponatremia given hyperglycemia  Consultants None  Procedures  Echocardiogram  Discharge Exam: Filed Vitals:   07/21/15 0605 07/21/15 0813  BP: 142/94   Pulse: 69 68  Temp: 98.4 F (36.9 C)   Resp: 18    Exam  General: Well developed, well nourished, NAD, appears stated age  HEENT: NCAT, mucous membranes moist.   Cardiovascular: S1 S2 auscultated, no murmurs, RRR  Respiratory: Clear to auscultation bilaterally  Abdomen: Soft, nontender, nondistended, + bowel sounds  Extremities: warm dry without cyanosis clubbing or edema  Neuro: AAOx3, nonfocal  Psych: Normal affect and demeanor  Discharge Instructions      Discharge Instructions    Discharge instructions  Complete by:  As directed   Patient will be  discharged to home.  Patient will need to follow up with primary care provider within one week of discharge, repeat BMP and discuss diabetes management. Patient should continue medications as prescribed.  Patient should follow a heart healthy/carb modified diet.            Medication List    TAKE these medications        blood glucose meter kit and supplies  Dispense based on patient and insurance preference. Use up to four times daily as directed. (FOR ICD-9 250.00, 250.01).     carvedilol 12.5 MG tablet  Commonly known as:  COREG  Take 1 tablet (12.5 mg total) by mouth daily.     feeding supplement (GLUCERNA 1.2 CAL) Liqd  Take 237 mLs by mouth daily.     GOODY HEADACHE PO  Take 1 packet by mouth daily as needed (Pain).     hydrALAZINE 50 MG tablet  Commonly known as:  APRESOLINE  Take 1 tablet (50 mg total) by mouth 3 (three) times daily.     hydrochlorothiazide 25 MG tablet  Commonly known as:  HYDRODIURIL  Take 1 tablet (25 mg total) by mouth daily.     Insulin Glargine 100 UNIT/ML Solostar Pen  Commonly known as:  LANTUS SOLOSTAR  Inject 15 Units into the skin daily at 10 pm.     isosorbide mononitrate 60 MG 24 hr tablet  Commonly known as:  IMDUR  Take 1 tablet (60 mg total) by mouth daily.     lisinopril 20 MG tablet  Commonly known as:  PRINIVIL,ZESTRIL  Take 1 tablet (20 mg total) by mouth 2 (two) times daily.     metFORMIN 500 MG tablet  Commonly known as:  GLUCOPHAGE  Take 1 tablet (500 mg total) by mouth 2 (two) times daily with a meal.     mupirocin nasal ointment 2 %  Commonly known as:  BACTROBAN  Place 1 application into the nose daily. Use one-half of tube in each nostril twice daily for five (5) days. After application, press sides of nose together and gently massage.     Pen Needles 3/16" 31G X 5 MM Misc  10 Units by Does not apply route daily at 10 pm.       Allergies  Allergen Reactions  . Ambien [Zolpidem Tartrate] Other (See Comments)      "Went berserk."   Follow-up Information    Follow up with Mauricio Po, [place]. Schedule an appointment as soon as possible for a visit in 1 week.   Specialty:  Family Medicine   Why:  Hospital follow up   Contact information:   [place] [address] [phone]        The results of significant diagnostics from this hospitalization (including imaging, microbiology, ancillary and laboratory) are listed below for reference.    Significant Diagnostic Studies: Dg Chest 2 View  07/19/2015  CLINICAL DATA:  Dyspnea EXAM: CHEST  2 VIEW COMPARISON:  07/12/2015 FINDINGS: The heart size and mediastinal contours are within normal limits. Both lungs are clear. The visualized skeletal structures are unremarkable. IMPRESSION: No active cardiopulmonary disease. Electronically Signed   By: Franchot Gallo M.D.   On: 07/19/2015 11:41   Dg Chest 2 View  07/12/2015  CLINICAL DATA:  History of diabetes, CHF, prostate cancer. Increased shortness of breath and heart palpitations starting today. EXAM: CHEST  2 VIEW COMPARISON:  Chest x-ray dated  02/16/2014. FINDINGS: Heart size is normal. Overall cardiomediastinal silhouette is stable in size and configuration. Lungs are clear. No pleural effusion no pneumothorax seen. Osseous and soft tissue structures about the chest are unremarkable. IMPRESSION: Stable chest x-ray.  No active cardiopulmonary disease. Electronically Signed   By: Franki Cabot M.D.   On: 07/12/2015 15:57    Microbiology: Recent Results (from the past 240 hour(s))  MRSA PCR Screening     Status: Abnormal   Collection Time: 07/12/15  9:55 PM  Result Value Ref Range Status   MRSA by PCR POSITIVE (A) NEGATIVE Final    Comment:        The GeneXpert MRSA Assay (FDA approved for NASAL specimens only), is one component of a comprehensive MRSA colonization surveillance program. It is not intended to diagnose MRSA infection nor to guide or monitor treatment for MRSA  infections. RESULT CALLED TO, READ BACK BY AND VERIFIED WITH: Oretha Milch [id number] @ 2482 BY J SCOTTON      Labs: Basic Metabolic Panel:  Recent Labs Lab 07/19/15 1046 07/20/15 0114 07/21/15 0445  NA 132* 131* 136  K 4.5 3.9 3.8  CL 100* 104 105  CO2 20* 21* 25  GLUCOSE 411* 329* 225*  BUN 37* 34* 22*  CREATININE 2.30* 1.60* 1.26*  CALCIUM 10.3 9.3 8.8*   Liver Function Tests:  Recent Labs Lab 07/19/15 1046  AST 30  ALT 31  ALKPHOS 81  BILITOT 1.0  PROT 8.0  ALBUMIN 4.0   No results for input(s): LIPASE, AMYLASE in the last 168 hours. No results for input(s): AMMONIA in the last 168 hours. CBC:  Recent Labs Lab 07/19/15 1046 07/20/15 0114 07/21/15 0445  WBC 8.8 7.9 6.8  NEUTROABS 5.3  --   --   HGB 13.6 11.6* 10.6*  HCT 39.6 34.1* 30.9*  MCV 86.8 88.3 86.1  PLT 347 277 271   Cardiac Enzymes:  Recent Labs Lab 07/19/15 1046 07/19/15 1344 07/19/15 1934 07/20/15 0114  TROPONINI 0.03 0.03 0.03 0.03   BNP: BNP (last 3 results)  Recent Labs  07/12/15 1621 07/19/15 1046  BNP 17.8 11.6    ProBNP (last 3 results) No results for input(s): PROBNP in the last 8760 hours.  CBG:  Recent Labs Lab 07/20/15 1139 07/20/15 1632 07/20/15 2118 07/20/15 2247 07/21/15 0734  GLUCAP 329* 253* 309* 278* 223*       Signed:  Cristal Ford  [facility] 07/21/2015, 11:22 AM

## 2015-07-21 NOTE — Progress Notes (Signed)
Reviewed discharge information with patient and caregiver. Answered all questions. Patient/caregiver able to teach back medications and reasons to contact MD/911. Patient verbalizes importance of PCP follow up appointment.  Abrie Egloff M. Nole Robey, RN  

## 2015-07-25 ENCOUNTER — Other Ambulatory Visit (INDEPENDENT_AMBULATORY_CARE_PROVIDER_SITE_OTHER): Payer: BLUE CROSS/BLUE SHIELD

## 2015-07-25 ENCOUNTER — Encounter: Payer: Self-pay | Admitting: Family

## 2015-07-25 ENCOUNTER — Ambulatory Visit (INDEPENDENT_AMBULATORY_CARE_PROVIDER_SITE_OTHER): Payer: BLUE CROSS/BLUE SHIELD | Admitting: Family

## 2015-07-25 VITALS — BP 108/70 | HR 82 | Temp 97.9°F | Resp 16 | Ht 65.0 in | Wt 175.0 lb

## 2015-07-25 DIAGNOSIS — I5022 Chronic systolic (congestive) heart failure: Secondary | ICD-10-CM

## 2015-07-25 DIAGNOSIS — E118 Type 2 diabetes mellitus with unspecified complications: Secondary | ICD-10-CM

## 2015-07-25 DIAGNOSIS — N182 Chronic kidney disease, stage 2 (mild): Secondary | ICD-10-CM

## 2015-07-25 DIAGNOSIS — I10 Essential (primary) hypertension: Secondary | ICD-10-CM

## 2015-07-25 DIAGNOSIS — E1165 Type 2 diabetes mellitus with hyperglycemia: Secondary | ICD-10-CM

## 2015-07-25 DIAGNOSIS — IMO0002 Reserved for concepts with insufficient information to code with codable children: Secondary | ICD-10-CM

## 2015-07-25 LAB — COMPREHENSIVE METABOLIC PANEL
ALT: 36 U/L (ref 0–53)
AST: 31 U/L (ref 0–37)
Albumin: 3.9 g/dL (ref 3.5–5.2)
Alkaline Phosphatase: 77 U/L (ref 39–117)
BUN: 15 mg/dL (ref 6–23)
CO2: 26 mEq/L (ref 19–32)
Calcium: 9.8 mg/dL (ref 8.4–10.5)
Chloride: 104 mEq/L (ref 96–112)
Creatinine, Ser: 1.18 mg/dL (ref 0.40–1.50)
GFR: 80.65 mL/min (ref >60.00)
Glucose, Bld: 202 mg/dL — ABNORMAL HIGH (ref 70–99)
Potassium: 4 mEq/L (ref 3.5–5.1)
Sodium: 136 mEq/L (ref 135–145)
Total Bilirubin: 0.5 mg/dL (ref 0.2–1.2)
Total Protein: 7.3 g/dL (ref 6.0–8.3)

## 2015-07-25 MED ORDER — INSULIN GLARGINE 100 UNIT/ML SOLOSTAR PEN: 20.0000 [IU] | PEN_INJECTOR | Freq: Every day | SUBCUTANEOUS | Status: DC

## 2015-07-25 MED ORDER — METFORMIN HCL ER 500 MG PO TB24: 1000.0000 mg | ORAL_TABLET | Freq: Every day | ORAL | Status: DC

## 2015-07-25 NOTE — Patient Instructions (Addendum)
Thank you for choosing [facility].  Summary/Instructions:  Please increase metformin to 1000 mg twice daily until completed. At refill, take 2 pills daily.   Increase your Lantus to 20 units daily.  Continue to monitor blood sugars 2 times daily and record them and a log to bring with you on your next office visit.  Monitor carbohydrate intake.  Your prescription(s) have been submitted to your pharmacy or been printed and provided for you. Please take as directed and contact our office if you believe you are having problem(s) with the medication(s) or have any questions.  Please stop by the lab on the lower level of the building for your blood work. Your results will be released to [place] (or called to you) after review, usually within 72 hours after test completion. If any changes need to be made, you will be notified at that same time.  1. The lab is open from 7:30am to 5:30 pm Monday-Friday  2. No appointment is necessary  3. Fasting (if needed) is 6-8 hours after food and drink; black  coffee and water are okay   If your symptoms worsen or fail to improve, please contact our office for further instruction, or in case of emergency go directly to the emergency room at the closest medical facility.

## 2015-07-25 NOTE — Assessment & Plan Note (Signed)
Hypertension adequately controlled with current regimen below goal 140/90. No adverse side effects or additional symptoms of end organ damage. Continue current dosage of carvedilol, hydralazine, hydrochlorothiazide, isosorbide mononitrate, and lisinopril. Continue to monitor blood pressure at home. Follow-up with chronic conditions.

## 2015-07-25 NOTE — Progress Notes (Signed)
Subjective:    Patient ID: [name], male    DOB: 19-May-1955, 61 y.o.   MRN: [id number]  Chief Complaint  Patient presents with  . Hospitalization Follow-up    231 was his sugar today, feels better since being out of the hospital    HPI:  [name] is a 60 y.o. male who  has a past medical history of CAD; CARDIOMYOPATHY; DIABETES MELLITUS, TYPE II; HYPERCHOLESTEROLEMIA; HYPERTENSION; PSA, INCREASED; Noncompliance; CHF (congestive heart failure) ([place]); PVCs (premature ventricular contractions); Asthma; Arthritis; and Cancer ([place]). and presents today for a hospitalization follow up.  Recently evaluated in the emergency department and admitted to the hospital for hyperglycemia and dyspnea. During his previous admission he was noted to have a mildly elevated troponin and was instructed to follow-up with cardiology outpatient. He was admitted the second time with shortness of breath at rest and exertion. No chest pain. He was found to be euvolemic with a BNP of 11. Echocardiogram with ejection fraction of 55-60% and grade 1 diastolic dysfunction. Troponins were recycled and unremarkable. D-dimer was less than 0.27. Dyspnea seemed to resolve over the course of hospitalization. He was noted to have acute kidney injury secondary to decreased volume/dehydration. His creatinine improved from 2.3-1.2. He was instructed to resume metformin upon discharge. Diabetes remained uncontrolled during hospitalization was placed on Lantus and insulin sliding scale. Diabetes coordinator nutrition work consult. Most recent A1c on 07/13/15 was 13.8. His Lantus was increased to 15 units daily. He was noted to be very reluctant to short/long term insulin. Requested to continue metformin and increase Lantus and follow-up with primary care. All hospital records, imaging, and labs were reviewed in detail.  Since leaving the hospital he reports feeling better. Notes his blood sugar today was 231. He is currently  taking Lantus and Metformin as prescribed and denies adverse side effects. Continues to take the Lantus at 15 units. Endorses some mild blurred vision with no other symptoms of end organ damage. Denies any further episodes of shortness of breath or chest pains.   Allergies  Allergen Reactions  . Ambien [Zolpidem Tartrate] Other (See Comments)    "Went berserk."     Current Outpatient Prescriptions on File Prior to Visit  Medication Sig Dispense Refill  . Aspirin-Acetaminophen-Caffeine (GOODY HEADACHE PO) Take 1 packet by mouth daily as needed (Pain).    . blood glucose meter kit and supplies Dispense based on patient and insurance preference. Use up to four times daily as directed. (FOR ICD-9 250.00, 250.01). 1 each 0  . carvedilol (COREG) 12.5 MG tablet Take 1 tablet (12.5 mg total) by mouth daily. 90 tablet 0  . hydrALAZINE (APRESOLINE) 50 MG tablet Take 1 tablet (50 mg total) by mouth 3 (three) times daily. 90 tablet 1  . hydrochlorothiazide (HYDRODIURIL) 25 MG tablet Take 1 tablet (25 mg total) by mouth daily. 90 tablet 0  . Insulin Pen Needle (PEN NEEDLES 3/16") 31G X 5 MM MISC 10 Units by Does not apply route daily at 10 pm. 100 each 2  . isosorbide mononitrate (IMDUR) 60 MG 24 hr tablet Take 1 tablet (60 mg total) by mouth daily. 90 tablet 0  . lisinopril (PRINIVIL,ZESTRIL) 20 MG tablet Take 1 tablet (20 mg total) by mouth 2 (two) times daily. 180 tablet 0  . mupirocin nasal ointment (BACTROBAN) 2 % Place 1 application into the nose daily. Use one-half of tube in each nostril twice daily for five (5) days. After application, press sides of nose  together and gently massage.    . Nutritional Supplements (FEEDING SUPPLEMENT, GLUCERNA 1.2 CAL,) LIQD Take 237 mLs by mouth daily. 30 mL 0   No current facility-administered medications on file prior to visit.     Past Surgical History  Procedure Laterality Date  . Cardiac catheterization  2004    nonobst dz  . Surgery scrotal /  testicular      at age 54  . Ablation  01-07-2013    RVOT PVC's ablated by Dr Rayann Heman along anteroseptal RVOT  . Cholecystectomy N/A 11/16/2013    Procedure: LAPAROSCOPIC CHOLECYSTECTOMY WITH INTRAOPERATIVE CHOLANGIOGRAM;  Surgeon: [name], MD;  Location: [facility];  Service: General;  Laterality: N/A;  . Supraventricular tachycardia ablation N/A 09/28/2012    Procedure: Tanna Furry Ablation;  Surgeon: [name], MD;  Location: [facility] CATH LAB;  Service: Cardiovascular;  Laterality: N/A;  . V-tach ablation N/A 01/07/2013    Procedure: V-TACH ABLATION;  Surgeon: [name], MD;  Location: [facility] CATH LAB;  Service: Cardiovascular;  Laterality: N/A;  . Robot assisted laparoscopic radical prostatectomy N/A 02/22/2014    Procedure: ROBOTIC ASSISTED LAPAROSCOPIC RADICAL PROSTATECTOMY WITH INDOCYANINE GREEN DYE AND OPEN UMBILICAL HERNIA REPAIR;  Surgeon: [name], MD;  [facility]: [facility];  Service: Urology;  Laterality: N/A;  . Lymphadenectomy Bilateral 02/22/2014    Procedure: PELVIC LYMPH NODE DISSECTION;  Surgeon: [name], MD;  Location: [facility];  Service: Urology;  Laterality: Bilateral;     Review of Systems  Constitutional: Negative for fever and chills.  Eyes:       Positive for blurred vision  Respiratory: Negative for cough, chest tightness and wheezing.   Cardiovascular: Negative for chest pain, palpitations and leg swelling.  Endocrine: Negative for polydipsia, polyphagia and polyuria.  Neurological: Negative for dizziness, weakness and light-headedness.      Objective:    BP 108/70 mmHg  Pulse 82  Temp(Src) 97.9 F (36.6 C) (Oral)  Resp 16  Ht '5\' 5"'  (1.651 m)  Wt 175 lb (79.379 kg)  BMI 29.12 kg/m2  SpO2 97% Nursing note and vital signs reviewed.  Physical Exam  Constitutional: He is oriented to person, place, and time. He appears well-developed and well-nourished. No distress.  Cardiovascular: Normal rate, regular rhythm, normal heart sounds and intact distal  pulses.   Pulmonary/Chest: Effort normal and breath sounds normal.  Musculoskeletal:  Diabetic Foot Exam - Simple   Simple Foot Form  Diabetic Foot exam was performed with the following findings:  Yes  07/25/2015 11:27 AM  Visual Inspection  No deformities, no ulcerations, no other skin breakdown bilaterally:  Yes  Sensation Testing  Intact to touch and monofilament testing bilaterally:  Yes  Pulse Check  Posterior Tibialis and Dorsalis pulse intact bilaterally:  Yes     Neurological: He is alert and oriented to person, place, and time.  Skin: Skin is warm and dry.  Psychiatric: He has a normal mood and affect. His behavior is normal. Judgment and thought content normal.       Assessment & Plan:   Problem List Items Addressed This Visit      Cardiovascular and Mediastinum   HTN (hypertension), malignant    Hypertension adequately controlled with current regimen below goal 140/90. No adverse side effects or additional symptoms of end organ damage. Continue current dosage of carvedilol, hydralazine, hydrochlorothiazide, isosorbide mononitrate, and lisinopril. Continue to monitor blood pressure at home. Follow-up with chronic conditions.      Relevant Orders   Comprehensive metabolic panel (Completed)  Chronic systolic heart failure (HCC)    Appears euvolemic and stable. NYHA class 1 with no restrictions in activity. Continue current dosage of carvedilol for sudden death prevention. Continue isosorbide mononitrate for blood pressure control. Follow-up and changes per cardiology. Continue to monitor.        Endocrine   Diabetes type 2, uncontrolled ([place]) - Primary    Type 2 diabetes remains uncontrolled with most recent A1c of 13.8 during hospitalization. Morning blood sugar today was greater than 200. Increase metformin and Lantus. Monitor blood sugars at home. Advised to decrease carbohydrate intake. Maintained on lisinopril for CAD risk reduction. Diabetic foot exam completed  today. Referral placed for diabetic eye exam. Pneumovax is up-to-date. Follow-up in one month to determine effectiveness of control.      Relevant Medications   metFORMIN (GLUCOPHAGE XR) 500 MG 24 hr tablet   Insulin Glargine (LANTUS SOLOSTAR) 100 UNIT/ML Solostar Pen   Other Relevant Orders   Comprehensive metabolic panel (Completed)     Genitourinary   CKD (chronic kidney disease) stage 2, GFR 60-89 ml/min    Noted to have acute kidney injury during hospitalization with improvement of his creatinine throughout hospital stay. Obtain complete metabolic panel to check kidney function. Continue current dosage of lisinopril for kidney protection. Follow-up and additional treatment pending blood work if necessary.      Relevant Orders   Comprehensive metabolic panel (Completed)       I have discontinued Mr. Mapel metFORMIN. I have also changed his Insulin Glargine. Additionally, I am having him start on metFORMIN. Lastly, I am having him maintain his Aspirin-Acetaminophen-Caffeine (GOODY HEADACHE PO), carvedilol, hydrALAZINE, hydrochlorothiazide, isosorbide mononitrate, lisinopril, Pen Needles 3/16", mupirocin nasal ointment, blood glucose meter kit and supplies, and feeding supplement (GLUCERNA 1.2 CAL).   Meds ordered this encounter  Medications  . metFORMIN (GLUCOPHAGE XR) 500 MG 24 hr tablet    Sig: Take 2 tablets (1,000 mg total) by mouth daily with breakfast.    Dispense:  60 tablet    Refill:  0    Please fill at next refill requested by patient for metformin.    Order Specific Question:  Supervising Provider    Answer:  Pricilla Holm A [[id number]]  . Insulin Glargine (LANTUS SOLOSTAR) 100 UNIT/ML Solostar Pen    Sig: Inject 20 Units into the skin daily at 10 pm.    Dispense:  15 mL    Refill:  0    Order Specific Question:  Supervising Provider    Answer:  Pricilla Holm A [[id number]]     Follow-up: Return in about 1 month (around 08/24/2015), or if symptoms worsen or  fail to improve.  Mauricio Po, FNP

## 2015-07-25 NOTE — Assessment & Plan Note (Signed)
Type 2 diabetes remains uncontrolled with most recent A1c of 13.8 during hospitalization. Morning blood sugar today was greater than 200. Increase metformin and Lantus. Monitor blood sugars at home. Advised to decrease carbohydrate intake. Maintained on lisinopril for CAD risk reduction. Diabetic foot exam completed today. Referral placed for diabetic eye exam. Pneumovax is up-to-date. Follow-up in one month to determine effectiveness of control.

## 2015-07-25 NOTE — Assessment & Plan Note (Signed)
Appears euvolemic and stable. NYHA class 1 with no restrictions in activity. Continue current dosage of carvedilol for sudden death prevention. Continue isosorbide mononitrate for blood pressure control. Follow-up and changes per cardiology. Continue to monitor.

## 2015-07-25 NOTE — Assessment & Plan Note (Signed)
Noted to have acute kidney injury during hospitalization with improvement of his creatinine throughout hospital stay. Obtain complete metabolic panel to check kidney function. Continue current dosage of lisinopril for kidney protection. Follow-up and additional treatment pending blood work if necessary.

## 2015-07-25 NOTE — Progress Notes (Signed)
Pre visit review using our clinic review tool, if applicable. No additional management support is needed unless otherwise documented below in the visit note. 

## 2015-08-02 ENCOUNTER — Ambulatory Visit (INDEPENDENT_AMBULATORY_CARE_PROVIDER_SITE_OTHER): Payer: BLUE CROSS/BLUE SHIELD | Admitting: Physician Assistant

## 2015-08-02 ENCOUNTER — Encounter (INDEPENDENT_AMBULATORY_CARE_PROVIDER_SITE_OTHER): Payer: Self-pay

## 2015-08-02 ENCOUNTER — Encounter: Payer: Self-pay | Admitting: Physician Assistant

## 2015-08-02 VITALS — BP 90/60 | HR 84 | Ht 65.0 in | Wt 171.4 lb

## 2015-08-02 DIAGNOSIS — E1122 Type 2 diabetes mellitus with diabetic chronic kidney disease: Secondary | ICD-10-CM

## 2015-08-02 DIAGNOSIS — I9589 Other hypotension: Secondary | ICD-10-CM

## 2015-08-02 DIAGNOSIS — N182 Chronic kidney disease, stage 2 (mild): Secondary | ICD-10-CM

## 2015-08-02 DIAGNOSIS — I2510 Atherosclerotic heart disease of native coronary artery without angina pectoris: Secondary | ICD-10-CM

## 2015-08-02 DIAGNOSIS — Z794 Long term (current) use of insulin: Secondary | ICD-10-CM

## 2015-08-02 DIAGNOSIS — R0600 Dyspnea, unspecified: Secondary | ICD-10-CM

## 2015-08-02 DIAGNOSIS — R0609 Other forms of dyspnea: Secondary | ICD-10-CM

## 2015-08-02 DIAGNOSIS — I428 Other cardiomyopathies: Secondary | ICD-10-CM

## 2015-08-02 DIAGNOSIS — I429 Cardiomyopathy, unspecified: Secondary | ICD-10-CM

## 2015-08-02 LAB — CBC WITH DIFFERENTIAL/PLATELET
Basophils Absolute: 80 cells/uL (ref 0–200)
Basophils Relative: 1 %
Eosinophils Absolute: 160 cells/uL (ref 15–500)
Eosinophils Relative: 2 %
HCT: 37.6 % — ABNORMAL LOW (ref 38.5–50.0)
Hemoglobin: 12.6 g/dL — ABNORMAL LOW (ref 13.2–17.1)
Lymphocytes Relative: 37 %
Lymphs Abs: 2960 cells/uL (ref 850–3900)
MCH: 29.4 pg (ref 27.0–33.0)
MCHC: 33.5 g/dL (ref 32.0–36.0)
MCV: 87.9 fL (ref 80.0–100.0)
MPV: 9 fL (ref 7.5–12.5)
Monocytes Absolute: 800 cells/uL (ref 200–950)
Monocytes Relative: 10 %
Neutro Abs: 4000 cells/uL (ref 1500–7800)
Neutrophils Relative %: 50 %
Platelets: 360 10*3/uL (ref 140–400)
RBC: 4.28 MIL/uL (ref 4.20–5.80)
RDW: 13.8 % (ref 11.0–15.0)
WBC: 8 10*3/uL (ref 3.8–10.8)

## 2015-08-02 MED ORDER — HYDROCHLOROTHIAZIDE 12.5 MG PO TABS: 12.5000 mg | ORAL_TABLET | Freq: Every day | ORAL | Status: DC

## 2015-08-02 MED ORDER — LISINOPRIL 10 MG PO TABS: 10.0000 mg | ORAL_TABLET | Freq: Every day | ORAL | Status: DC

## 2015-08-02 NOTE — Patient Instructions (Signed)
Medication Instructions:  Your physician has recommended you make the following change in your medication:  1) REDUCE Lisinopril to 10mg  daily. An Rx has been sent to your pharmacy 2) REDUCE HCTZ to 12.5mg  daily. An Rx has been sent to your pharmacy   Labwork: Cbc today  Testing/Procedures: None ordered  Follow-Up: Your physician recommends that you schedule a follow-up appointment in: 4 weeks with Desiree Hane or  APP   Any Other Special Instructions Will Be Listed Below (If Applicable).     If you need a refill on your cardiac medications before your next appointment, please call your pharmacy.

## 2015-08-02 NOTE — Progress Notes (Signed)
Cardiology Office Note    Date:  08/02/2015   ID:  [name], DOB 11-09-1955, MRN [id number]  PCP:  Mauricio Po, FNP  Cardiologist:  [name] / [name]  Chief Complaint  Patient presents with  . Hospitalization Follow-up    post ED visit, seen for Dr. Ardyth Man. Allred    History of Present Illness:  [name] is a 60 y.o. male with PMH of HTN, PVC s/p RVOT ablation, chronic systolic HF, CKD stage III, h/o nonobstructive CAD, and borderline DM II . His last cardiac cath in 2005 showed nonobstructive CAD with 25% D1 stenosis, 25% ramus stenosis. He had a 48-hour Holter monitor on 08/25/2012 that showed 39% monomorphic PVCs, he was referred to [name]. He was subsequently taken for PVC mapping followed by potential ablation, however he was not having enough PVCs to map, therefore no ablation was done. He underwent a second PVC ablation by [name] on 01/07/2013. He had 24-hour Holter monitor in January 2015 that showed decreased PVC burden. He had a history of nonischemic cardiomyopathy, his EF was 20-25% in July 2014. By February 2015, echocardiogram showed EF 40-45%. He was diagnosed with prostate cancer near the end of 2015 and early 2016 and underwent robotic prostatectomy on 02/24/2014.  He was last seen by [name] on 05/11/2015 at which times he was doing well after ablation, therefore no further EP workup was planned. It was felt he only need to follow-up with the EP on an as-needed basis. He was recently admitted on 07/12/2015 for dyspnea and hyperglycemia with blood glucose greater than 700. During the hospitalization, he had mildly elevated troponin of 0.04 which stayed the same on repeat and was instructed to follow-up with cardiology as outpatient. The mildly elevated troponin was completely flat and consistent with demand ischemia. He was discharged on 6/16, however readmitted on 6/22 with dyspnea. He also had acute on chronic renal insufficiency which  was suspected to be related to dehydration. D-dimer was negative. Echo 07/19/2015 shows EF has improved to 55-60%, grade 1 DD.    Patient presents today for follow-up, he continued to have dyspnea on exertion. He denies any chest discomfort. He denies any lower extremity swelling. He denies any prior history of DVT, during recent hospitalization, he had a d-dimer that was negative. On recent hospitalization, it was also noted on echocardiogram that his EF is now back to normal. Given recent dehydration episode, I will decrease the HCTZ to 12.5 mg daily. His blood pressure today is 90/60. I will also decrease his lisinopril to 10 mg daily. He does not have any obvious chest pain, and his EKG is chronically abnormal with T-wave inversion in the inferolateral leads. If he continued to have dyspnea on exertion, I think it is prudent to obtain treadmill Myoview. I will hold off for now to observe his symptom. Although he does have decreased breath sound bilaterally on physical exam, he denies ever had a history of smoking, doubt COPD at this point. We'll repeat a CBC to make sure he is not having worsening anemia. Otherwise, we will reassess on follow-up once his blood pressure has come up. It is possible that recent dyspnea on exertion is related to dehydration and hypotension. He also described episodes were he have dizziness when he changed from sitting position to a standing position. 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 Yesterday after he got off,  he suddenly felt all of his energy drained out of his body. The symptom is concerning for baseline low blood pressure, it is likely that during work, he has been compensating and only noticed the symptom after he got off the seven-day shift.   Past Medical History  Diagnosis Date  . CAD     nonobst cath 2004  . CARDIOMYOPATHY     NICM, LVEF 30% 01/2011 echo; 20-25% 07/2012 echo  . DIABETES MELLITUS,  TYPE II   . HYPERCHOLESTEROLEMIA   . HYPERTENSION   . PSA, INCREASED   . Noncompliance   . CHF (congestive heart failure) ([place])   . PVCs (premature ventricular contractions)     s/p ablation 01-07-2013 by Dr Rayann [name]  . Asthma     years ago   . Arthritis     lower back   . Cancer [facility])     prostate cancer    Past Surgical History  Procedure Laterality Date  . Cardiac catheterization  2004    nonobst dz  . Surgery scrotal / testicular      at age 70  . Ablation  01-07-2013    RVOT PVC's ablated by Dr Rayann [name] along anteroseptal RVOT  . Cholecystectomy N/A 11/16/2013    Procedure: LAPAROSCOPIC CHOLECYSTECTOMY WITH INTRAOPERATIVE CHOLANGIOGRAM;  Surgeon: [name], MD;  [facility];  Service: General;  Laterality: N/A;  . Supraventricular tachycardia ablation N/A 09/28/2012    Procedure: Tanna Furry Ablation;  Surgeon: [name], MD;  Location: [facility] CATH LAB;  Service: Cardiovascular;  Laterality: N/A;  . V-tach ablation N/A 01/07/2013    Procedure: V-TACH ABLATION;  Surgeon: [name], MD;  Location: [facility] CATH LAB;  Service: Cardiovascular;  Laterality: N/A;  . Robot assisted laparoscopic radical prostatectomy N/A 02/22/2014    Procedure: ROBOTIC ASSISTED LAPAROSCOPIC RADICAL PROSTATECTOMY WITH INDOCYANINE GREEN DYE AND OPEN UMBILICAL HERNIA REPAIR;  Surgeon: [name], MD;  [facility]: [facility];  Service: Urology;  Laterality: N/A;  . Lymphadenectomy Bilateral 02/22/2014    Procedure: PELVIC LYMPH NODE DISSECTION;  Surgeon: [name], MD;  Location: [facility];  Service: Urology;  Laterality: Bilateral;    Current Medications: Outpatient Prescriptions Prior to Visit  Medication Sig Dispense Refill  . Aspirin-Acetaminophen-Caffeine (GOODY HEADACHE PO) Take 1 packet by mouth daily as needed (Pain).    . carvedilol (COREG) 12.5 MG tablet Take 1 tablet (12.5 mg total) by mouth daily. 90 tablet 0  . hydrALAZINE (APRESOLINE) 50 MG tablet Take 1 tablet (50 mg total) by mouth 3  (three) times daily. 90 tablet 1  . Insulin Glargine (LANTUS SOLOSTAR) 100 UNIT/ML Solostar Pen Inject 20 Units into the skin daily at 10 pm. 15 mL 0  . isosorbide mononitrate (IMDUR) 60 MG 24 hr tablet Take 1 tablet (60 mg total) by mouth daily. 90 tablet 0  . metFORMIN (GLUCOPHAGE XR) 500 MG 24 hr tablet Take 2 tablets (1,000 mg total) by mouth daily with breakfast. 60 tablet 0  . Nutritional Supplements (FEEDING SUPPLEMENT, GLUCERNA 1.2 CAL,) LIQD Take 237 mLs by mouth daily. 30 mL 0  . hydrochlorothiazide (HYDRODIURIL) 25 MG tablet Take 1 tablet (25 mg total) by mouth daily. 90 tablet 0  . lisinopril (PRINIVIL,ZESTRIL) 20 MG tablet Take 1 tablet (20 mg total) by mouth 2 (two) times daily. 180 tablet 0  . blood glucose meter kit and supplies Dispense based on patient and insurance preference. Use up to four times daily as directed. (FOR ICD-9 250.00, 250.01). 1 each 0  . Insulin  Pen Needle (PEN NEEDLES 3/16") 31G X 5 MM MISC 10 Units by Does not apply route daily at 10 pm. 100 each 2  . mupirocin nasal ointment (BACTROBAN) 2 % Place 1 application into the nose daily. Reported on 08/02/2015     No facility-administered medications prior to visit.     Allergies:   Ambien   Social History   Social History  . Marital Status: Single    Spouse Name: N/A  . Number of Children: N/A  . Years of Education: N/A   Occupational History  . Health services    Social History Main Topics  . Smoking status: Never Smoker   . 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 Alcohol Use: No     Comment: rare  . Drug Use: No  . Sexual Activity: No   Other Topics Concern  . None   Social History Narrative   Single - divorced x 3 -   Lives with 2 sons, enjoys spending time with 6 g-kids and 3 kids     Family History:  The patient's family history includes Heart attack in his paternal grandfather; Heart failure in his maternal grandmother;  Hypertension in his mother; Peripheral vascular disease in his mother; Prostate cancer in his brother.   ROS:   Please see the history of present illness.    ROS All other systems reviewed and are negative.   PHYSICAL EXAM:   VS:  BP 90/60 mmHg  Pulse 84  Ht _0  (1.651 m)  Wt 171 lb 6.4 oz (77.747 kg)  BMI 28.52 kg/m2   GEN: Well nourished, well developed, in no acute distress HEENT: normal Neck: no JVD, carotid bruits, or masses Cardiac: RRR; no murmurs, rubs, or gallops,no edema  Respiratory:  clear to auscultation bilaterally, normal work of breathing GI: soft, nontender, nondistended, + BS MS: no deformity or atrophy Skin: warm and dry, no rash Neuro:  Alert and Oriented x 3, Strength and sensation are intact Psych: euthymic mood, full affect  Wt Readings from Last 3 Encounters:  08/02/15 171 lb 6.4 oz (77.747 kg)  07/25/15 175 lb (79.379 kg)  07/21/15 174 lb 6.4 oz (79.107 kg)      Studies/Labs Reviewed:   EKG:  EKG is not ordered today.   Recent Labs: 07/19/2015: B Natriuretic Peptide 11.6 07/21/2015: Hemoglobin 10.6*; Platelets 271 07/25/2015: ALT 36; BUN 15; Creatinine, Ser 1.18; Potassium 4.0; Sodium 136   Lipid Panel    Component Value Date/Time   CHOL 104 09/01/2013 1148   TRIG 57.0 09/01/2013 1148   TRIG 200 09/20/2007   HDL 34.40* 09/01/2013 1148   CHOLHDL 3 09/01/2013 1148   VLDL 11.4 09/01/2013 1148   LDLCALC 58 09/01/2013 1148   LDLDIRECT 148.8 04/25/2009 0000    Additional studies/ records that were reviewed today include:   Echo 02/12/2011 EF 20% Echo 07/31/2012 EF 20-25% Echo 11/24/2012 EF 30% Echo 03/21/2013 EF 40-45% Echo 07/19/2015 EF 55-60%    ASSESSMENT:    1. DOE (dyspnea on exertion)   2. Coronary artery disease involving native coronary artery of native heart without angina pectoris   3. Other specified hypotension   4. Type 2 diabetes mellitus with stage 2 chronic kidney disease, with long-term current use of insulin ([place])     5. NICM (nonischemic cardiomyopathy) ([place]) with improved EF      PLAN:  In order of problems listed above:  1.  DOE: denies any CP, DOE for several days. Will see if symptom improve with improving BP. Alternative possible cause include anginal equivalent vs COPD vs increasing PVC burden. Obtain EKG on next visit. If no frequent PVCs, plan for treadmill myoview if he continue to have symptom.  2. NICM/H/o chronic systolic HF with improved EF  - EF 20% Nadir 2013, last echo 07/19/2015 EF 55-60%  - euvolemic on exam. He was seen in the ED for dehydration recently. His BP today in 90/60, given improved EF, will decrease HCTZ to 12.90m daily and lisinopril down to 10 mg daily    3. HTN, hypotensive today, cutting back on lisinopril and HCTZ  4. PVC s/p RVOT ablation: followed by EP on an as needed basis  5. h/o nonobstructive CAD: no obvious CP. Last cardiac cath in 2005 showed nonobstructive CAD with 25% D1 stenosis, 25% ramus stenosis.  6. Uncontrolled DM II: Hgb A1C 13.8 on 07/13/2015, he need better management of DM    Medication Adjustments/Labs and Tests Ordered: Current medicines are reviewed at length with the patient today.  Concerns regarding medicines are outlined above.  Medication changes, Labs and Tests ordered today are listed in the Patient Instructions below. Patient Instructions  Medication Instructions:  Your physician has recommended you make the following change in your medication:  1) REDUCE Lisinopril to 133mdaily. An Rx has been sent to your pharmacy 2) REDUCE HCTZ to 12.94m894maily. An Rx has been sent to your pharmacy   Labwork: Cbc today  Testing/Procedures: None ordered  Follow-Up: Your physician recommends that you schedule a follow-up appointment in: 4 weeks with HaoDesiree Hane  APP   Any Other Special Instructions Will Be Listed Below (If Applicable).     If you need a refill on your cardiac medications before your next appointment, please call  your pharmacy.       SigHilbert CorriganA [place]/06/2015 5:16 PM    [facility] [address]  [id number]: [phone]: [phone]

## 2015-08-28 ENCOUNTER — Ambulatory Visit: Payer: BLUE CROSS/BLUE SHIELD | Admitting: Family

## 2015-08-29 ENCOUNTER — Ambulatory Visit: Payer: BLUE CROSS/BLUE SHIELD | Admitting: Physician Assistant

## 2015-08-29 DIAGNOSIS — R0989 Other specified symptoms and signs involving the circulatory and respiratory systems: Secondary | ICD-10-CM

## 2015-09-04 ENCOUNTER — Encounter (HOSPITAL_COMMUNITY): Payer: BLUE CROSS/BLUE SHIELD | Admitting: Internal Medicine

## 2015-10-04 ENCOUNTER — Other Ambulatory Visit: Payer: Self-pay | Admitting: Family

## 2015-10-28 DIAGNOSIS — I219 Acute myocardial infarction, unspecified: Secondary | ICD-10-CM

## 2015-10-28 HISTORY — DX: Acute myocardial infarction, unspecified: I21.9

## 2015-11-10 ENCOUNTER — Encounter (HOSPITAL_COMMUNITY): Payer: Self-pay | Admitting: Emergency Medicine

## 2015-11-10 ENCOUNTER — Emergency Department (HOSPITAL_COMMUNITY)
Admission: EM | Admit: 2015-11-10 | Discharge: 2015-11-10 | Disposition: A | Discharge status: Home / Self Care | Payer: BLUE CROSS/BLUE SHIELD | Attending: Emergency Medicine | Admitting: Emergency Medicine

## 2015-11-10 ENCOUNTER — Emergency Department (HOSPITAL_COMMUNITY): Payer: BLUE CROSS/BLUE SHIELD

## 2015-11-10 DIAGNOSIS — I509 Heart failure, unspecified: Secondary | ICD-10-CM | POA: Diagnosis not present

## 2015-11-10 DIAGNOSIS — E1122 Type 2 diabetes mellitus with diabetic chronic kidney disease: Secondary | ICD-10-CM | POA: Insufficient documentation

## 2015-11-10 DIAGNOSIS — I2510 Atherosclerotic heart disease of native coronary artery without angina pectoris: Secondary | ICD-10-CM | POA: Insufficient documentation

## 2015-11-10 DIAGNOSIS — N182 Chronic kidney disease, stage 2 (mild): Secondary | ICD-10-CM | POA: Insufficient documentation

## 2015-11-10 DIAGNOSIS — Z79899 Other long term (current) drug therapy: Secondary | ICD-10-CM | POA: Diagnosis not present

## 2015-11-10 DIAGNOSIS — Z7982 Long term (current) use of aspirin: Secondary | ICD-10-CM | POA: Diagnosis not present

## 2015-11-10 DIAGNOSIS — J45909 Unspecified asthma, uncomplicated: Secondary | ICD-10-CM | POA: Insufficient documentation

## 2015-11-10 DIAGNOSIS — Z7984 Long term (current) use of oral hypoglycemic drugs: Secondary | ICD-10-CM | POA: Diagnosis not present

## 2015-11-10 DIAGNOSIS — Z8546 Personal history of malignant neoplasm of prostate: Secondary | ICD-10-CM | POA: Insufficient documentation

## 2015-11-10 DIAGNOSIS — R079 Chest pain, unspecified: Secondary | ICD-10-CM | POA: Diagnosis present

## 2015-11-10 DIAGNOSIS — I130 Hypertensive heart and chronic kidney disease with heart failure and stage 1 through stage 4 chronic kidney disease, or unspecified chronic kidney disease: Secondary | ICD-10-CM | POA: Insufficient documentation

## 2015-11-10 LAB — BASIC METABOLIC PANEL
Anion gap: 9 (ref 5–15)
BUN: 17 mg/dL (ref 6–20)
CO2: 23 mmol/L (ref 22–32)
Calcium: 9.5 mg/dL (ref 8.9–10.3)
Chloride: 103 mmol/L (ref 101–111)
Creatinine, Ser: 1.43 mg/dL — ABNORMAL HIGH (ref 0.61–1.24)
GFR calc Af Amer: 60 mL/min — ABNORMAL LOW (ref >60)
GFR calc non Af Amer: 51 mL/min — ABNORMAL LOW (ref >60)
Glucose, Bld: 197 mg/dL — ABNORMAL HIGH (ref 65–99)
Potassium: 3.8 mmol/L (ref 3.5–5.1)
Sodium: 135 mmol/L (ref 135–145)

## 2015-11-10 LAB — CBC
HCT: 40.8 % (ref 39.0–52.0)
Hemoglobin: 14 g/dL (ref 13.0–17.0)
MCH: 29.9 pg (ref 26.0–34.0)
MCHC: 34.3 g/dL (ref 30.0–36.0)
MCV: 87.2 fL (ref 78.0–100.0)
Platelets: 266 10*3/uL (ref 150–400)
RBC: 4.68 MIL/uL (ref 4.22–5.81)
RDW: 12.7 % (ref 11.5–15.5)
WBC: 8.3 10*3/uL (ref 4.0–10.5)

## 2015-11-10 LAB — I-STAT TROPONIN, ED
Troponin i, poc: 0.02 ng/mL (ref 0.00–0.08)
Troponin i, poc: 0.02 ng/mL (ref 0.00–0.08)

## 2015-11-10 MED ORDER — CARVEDILOL 12.5 MG PO TABS
12.5000 mg | ORAL_TABLET | Freq: Once | ORAL | Status: AC
  Administered 2015-11-10: 12.5 mg via ORAL
  Filled 2015-11-10: qty 1

## 2015-11-10 MED ORDER — HYDROCHLOROTHIAZIDE 12.5 MG PO CAPS
12.5000 mg | ORAL_CAPSULE | Freq: Every day | ORAL | Status: DC
  Administered 2015-11-10: 12.5 mg via ORAL
  Filled 2015-11-10: qty 1

## 2015-11-10 NOTE — ED Provider Notes (Signed)
[place] DEPT Provider Note   CSN: [id number] Arrival date & time: 11/10/15  [id number]   By signing my name below, I, Eunice Blase, attest that this documentation has been prepared under the direction and in the presence of Veryl Speak, MD. Electronically signed, Eunice Blase, ED Scribe. 11/10/15. 3:58 AM.   History   Chief Complaint Chief Complaint  Patient presents with  . Chest Pain    The history is provided by the patient and the spouse. No language interpreter was used.   HPI Comments: [name] is a 60 y.o. male who presents to the Emergency Department complaining of sharp, intermittent chest pain starting last night. The pt reports that he ate jerk chicken prior to onset of the symptoms, and he states that the first episode lasted 15 minutes. He reports associated productive cough, SOB, and radiating pain into left side of the neck. The pt denies nausea and vomiting. He reports a PMHx of CHF and GERD. The pt is not sure if he has his gallbladder.  Past Medical History:  Diagnosis Date  . Arthritis    lower back   . Asthma    years ago   . CAD    nonobst cath 2004  . Cancer [facility])    prostate cancer  . CARDIOMYOPATHY    NICM, LVEF 30% 01/2011 echo; 20-25% 07/2012 echo  . CHF (congestive heart failure) ([place])   . DIABETES MELLITUS, TYPE II   . HYPERCHOLESTEROLEMIA   . HYPERTENSION   . Noncompliance   . PSA, INCREASED   . PVCs (premature ventricular contractions)    s/p ablation 01-07-2013 by Dr Rayann Heman    Patient Active Problem List   Diagnosis Date Noted  . Acute kidney injury ([place]) 07/19/2015  . Dyspnea 07/19/2015  . AKI (acute kidney injury) ([place]) 07/19/2015  . Chest pain 07/12/2015  . Hyperglycemia 07/12/2015  . Thigh pain 10/25/2014  . Prostate cancer ([place]) 02/22/2014  . Cholelithiasis and cholecystitis without obstruction 11/16/2013  . PVC's (premature ventricular contractions) 01/07/2013  . CKD (chronic kidney disease) stage 2, GFR 60-89  ml/min 08/25/2012  . OSA (obstructive sleep apnea) 08/25/2012  . Chronic systolic heart failure ([place]) 08/06/2012  . HTN (hypertension), malignant 02/12/2011  . Non compliance w medication regimen 02/12/2011  . HYPERCHOLESTEROLEMIA 09/17/2009  . PREMATURE VENTRICULAR CONTRACTIONS 09/17/2009  . PSA, INCREASED 04/26/2009  . Diabetes type 2, uncontrolled ([place]) 04/25/2009  . CARDIOMYOPATHY 04/25/2009  . CAD 04/20/2009    Past Surgical History:  Procedure Laterality Date  . ABLATION  01-07-2013   RVOT PVC's ablated by Dr Rayann Heman along anteroseptal RVOT  . CARDIAC CATHETERIZATION  2004   nonobst dz  . CHOLECYSTECTOMY N/A 11/16/2013   Procedure: LAPAROSCOPIC CHOLECYSTECTOMY WITH INTRAOPERATIVE CHOLANGIOGRAM;  Surgeon: [name], MD;  Location: [facility] ORS;  Service: General;  Laterality: N/A;  . LYMPHADENECTOMY Bilateral 02/22/2014   Procedure: PELVIC LYMPH NODE DISSECTION;  Surgeon: [name], MD;  Location: [facility];  Service: Urology;  Laterality: Bilateral;  . ROBOT ASSISTED LAPAROSCOPIC RADICAL PROSTATECTOMY N/A 02/22/2014   Procedure: ROBOTIC ASSISTED LAPAROSCOPIC RADICAL PROSTATECTOMY WITH INDOCYANINE GREEN DYE AND OPEN UMBILICAL HERNIA REPAIR;  Surgeon: [name], MD;  Location: [facility];  Service: Urology;  Laterality: N/A;  . SUPRAVENTRICULAR TACHYCARDIA ABLATION N/A 09/28/2012   Procedure: Tanna Furry Ablation;  Surgeon: [name], MD;  Location: [facility] CATH LAB;  Service: Cardiovascular;  Laterality: N/A;  . SURGERY SCROTAL / TESTICULAR     at age 64  . V-TACH ABLATION N/A 01/07/2013  Procedure: V-TACH ABLATION;  Surgeon: [name], MD;  Location: [facility] CATH LAB;  Service: Cardiovascular;  Laterality: N/A;       Home Medications    Prior to Admission medications   Medication Sig Start Date End Date Taking? Authorizing Provider  Aspirin-Acetaminophen-Caffeine (GOODY HEADACHE PO) Take 1 packet by mouth daily as needed (Pain).    Historical Provider, MD  carvedilol (COREG)  12.5 MG tablet Take 1 tablet (12.5 mg total) by mouth daily. 07/10/15   Lacretia Leigh, MD  carvedilol (COREG) 12.5 MG tablet TAKE ONE TABLET BY MOUTH ONCE DAILY 10/04/15   Golden Circle, FNP  hydrALAZINE (APRESOLINE) 50 MG tablet Take 1 tablet (50 mg total) by mouth 3 (three) times daily. 07/10/15   Lacretia Leigh, MD  hydrochlorothiazide (HYDRODIURIL) 12.5 MG tablet Take 1 tablet (12.5 mg total) by mouth daily. 08/02/15   Almyra Deforest, PA  Insulin Glargine (LANTUS SOLOSTAR) 100 UNIT/ML Solostar Pen Inject 20 Units into the skin daily at 10 pm. 07/25/15   Golden Circle, FNP  isosorbide mononitrate (IMDUR) 60 MG 24 hr tablet Take 1 tablet (60 mg total) by mouth daily. 07/10/15   Lacretia Leigh, MD  lisinopril (PRINIVIL,ZESTRIL) 10 MG tablet Take 1 tablet (10 mg total) by mouth daily. 08/02/15   Almyra Deforest, PA  metFORMIN (GLUCOPHAGE XR) 500 MG 24 hr tablet Take 2 tablets (1,000 mg total) by mouth daily with breakfast. 07/25/15   Golden Circle, FNP  Nutritional Supplements (FEEDING SUPPLEMENT, GLUCERNA 1.2 CAL,) LIQD Take 237 mLs by mouth daily. 07/21/15   Cristal Ford, DO    Family History Family History  Problem Relation Age of Onset  . Hypertension Mother   . Peripheral vascular disease Mother   . Peripheral vascular disease    . Hypertension    . Prostate cancer    . Prostate cancer Brother   . Heart attack Paternal Grandfather   . Heart failure Maternal Grandmother     Social History Social History  Substance Use Topics  . Smoking status: Never Smoker  . 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 Alcohol use No     Comment: rare     Allergies   Ambien [zolpidem tartrate]   Review of Systems Review of Systems  Respiratory: Positive for cough (productive) and shortness of breath.   Cardiovascular: Positive for chest pain (radiating to left side of the neck).  Gastrointestinal: Negative for nausea and vomiting.  All other  systems reviewed and are negative.    Physical Exam Updated Vital Signs BP (!) 198/106 (BP Location: Right Arm)   Temp 98.1 F (36.7 C) (Oral)   Resp 19   Ht 5\' 5"  (1.651 m)   Wt 185 lb (83.9 kg)   SpO2 98%   BMI 30.79 kg/m   Physical Exam  Constitutional: He is oriented to person, place, and time. He appears well-developed and well-nourished.  HENT:  Head: Normocephalic and atraumatic.  Eyes: EOM are normal.  Neck: Normal range of motion.  Cardiovascular: Normal rate, regular rhythm, normal heart sounds and intact distal pulses.   Pulmonary/Chest: Effort normal and breath sounds normal. No respiratory distress.  Abdominal: Soft. He exhibits no distension. There is no tenderness.  Musculoskeletal: Normal range of motion.  Neurological: He is alert and oriented to person, place, and time.  Skin: Skin is warm and dry.  Psychiatric: He has a normal mood and affect. Judgment normal.  Nursing note and vitals reviewed.    ED Treatments / Results  DIAGNOSTIC STUDIES: Oxygen Saturation is 98% on RA, normal by my interpretation.    COORDINATION OF CARE: 3:59 AM Discussed treatment plan with pt at bedside and pt agreed to plan.    Labs (all labs ordered are listed, but only abnormal results are displayed) Labs Reviewed  [place], ED    EKG ED ECG REPORT   Date: 11/10/2015  Rate: 60  Rhythm: normal sinus rhythm  QRS Axis: left  Intervals: normal  ST/T Wave abnormalities: LVH with repolarization abnormality  Conduction Disutrbances:none  Narrative Interpretation:   Old EKG Reviewed: unchanged  I have personally reviewed the EKG tracing and agree with the computerized printout as noted.   Radiology No results found.  Procedures Procedures (including critical care time)  Medications Ordered in ED Medications - No data to display   Initial Impression / Assessment and Plan / ED Course  I have reviewed the triage vital  signs and the nursing notes.  Pertinent labs & imaging results that were available during my care of the patient were reviewed by me and considered in my medical decision making (see chart for details).  Clinical Course    Patient presents with complaints of chest pain that woke him from sleep in the night. It was associated with shortness of breath and nausea. His workup reveals an unchanged EKG and negative troponin 2. He has remained pain free throughout his emergency department course. In reviewing his medical record, he had a normal stress test approximately one year ago. He will be discharged, to follow-up with cardiology next week and return if his symptoms worsen.  Final Clinical Impressions(s) / ED Diagnoses   Final diagnoses:  None    New Prescriptions New Prescriptions   No medications on file    I personally performed the services described in this documentation, which was scribed in my presence. The recorded information has been reviewed and is accurate.       Veryl Speak, MD 11/10/15 0700

## 2015-11-10 NOTE — ED Triage Notes (Signed)
Pt. arrived with EMS from home woke up this morning with central chest pain " sharp / burning " , with mild SOB and productive cough , denies emesis or diaphoresis , he received ASA 324 mg and NTG sl by EMS with relief .

## 2015-11-10 NOTE — ED Notes (Signed)
Pt's morning HTN meds ordered. Repeat troponin drawn by phleb

## 2015-11-10 NOTE — Discharge Instructions (Signed)
Follow-up with your cardiologist in the next week, and return to the ER if your symptoms significantly worsen or change.

## 2015-11-10 NOTE — ED Notes (Addendum)
Pt denies pain at this time Pt states he awoke @3  with midsternal crushing chest pain radiating to L neck and side of head, pt states he became [facility], felt flushed and became diaphoretic. Pt states he felt pain was subsiding but completely resolved after EMS Nitro and ASA 81mg  x 4.  Pt states he is compliant with HTN meds qam.  Pt resting comfortably at this time, family at bedside.

## 2015-11-11 ENCOUNTER — Observation Stay (HOSPITAL_COMMUNITY)
Admission: EM | Admit: 2015-11-11 | Discharge: 2015-11-13 | Disposition: A | Discharge status: Home / Self Care | Payer: BLUE CROSS/BLUE SHIELD | Attending: Cardiology | Admitting: Cardiology

## 2015-11-11 ENCOUNTER — Encounter (HOSPITAL_COMMUNITY): Payer: Self-pay | Admitting: Emergency Medicine

## 2015-11-11 DIAGNOSIS — I130 Hypertensive heart and chronic kidney disease with heart failure and stage 1 through stage 4 chronic kidney disease, or unspecified chronic kidney disease: Secondary | ICD-10-CM | POA: Diagnosis not present

## 2015-11-11 DIAGNOSIS — I10 Essential (primary) hypertension: Secondary | ICD-10-CM | POA: Diagnosis present

## 2015-11-11 DIAGNOSIS — I214 Non-ST elevation (NSTEMI) myocardial infarction: Secondary | ICD-10-CM | POA: Diagnosis not present

## 2015-11-11 DIAGNOSIS — Z8042 Family history of malignant neoplasm of prostate: Secondary | ICD-10-CM | POA: Insufficient documentation

## 2015-11-11 DIAGNOSIS — E118 Type 2 diabetes mellitus with unspecified complications: Secondary | ICD-10-CM | POA: Diagnosis not present

## 2015-11-11 DIAGNOSIS — Z7982 Long term (current) use of aspirin: Secondary | ICD-10-CM | POA: Insufficient documentation

## 2015-11-11 DIAGNOSIS — Z8546 Personal history of malignant neoplasm of prostate: Secondary | ICD-10-CM | POA: Diagnosis not present

## 2015-11-11 DIAGNOSIS — J45909 Unspecified asthma, uncomplicated: Secondary | ICD-10-CM | POA: Insufficient documentation

## 2015-11-11 DIAGNOSIS — N182 Chronic kidney disease, stage 2 (mild): Secondary | ICD-10-CM | POA: Diagnosis not present

## 2015-11-11 DIAGNOSIS — I5022 Chronic systolic (congestive) heart failure: Secondary | ICD-10-CM | POA: Diagnosis present

## 2015-11-11 DIAGNOSIS — G4733 Obstructive sleep apnea (adult) (pediatric): Secondary | ICD-10-CM | POA: Diagnosis present

## 2015-11-11 DIAGNOSIS — R079 Chest pain, unspecified: Secondary | ICD-10-CM | POA: Diagnosis present

## 2015-11-11 DIAGNOSIS — I493 Ventricular premature depolarization: Secondary | ICD-10-CM | POA: Diagnosis present

## 2015-11-11 DIAGNOSIS — E1151 Type 2 diabetes mellitus with diabetic peripheral angiopathy without gangrene: Secondary | ICD-10-CM | POA: Diagnosis not present

## 2015-11-11 DIAGNOSIS — Z8249 Family history of ischemic heart disease and other diseases of the circulatory system: Secondary | ICD-10-CM | POA: Diagnosis not present

## 2015-11-11 DIAGNOSIS — I429 Cardiomyopathy, unspecified: Secondary | ICD-10-CM | POA: Diagnosis not present

## 2015-11-11 DIAGNOSIS — E1122 Type 2 diabetes mellitus with diabetic chronic kidney disease: Secondary | ICD-10-CM | POA: Diagnosis not present

## 2015-11-11 DIAGNOSIS — IMO0002 Reserved for concepts with insufficient information to code with codable children: Secondary | ICD-10-CM | POA: Diagnosis present

## 2015-11-11 DIAGNOSIS — E1165 Type 2 diabetes mellitus with hyperglycemia: Secondary | ICD-10-CM

## 2015-11-11 DIAGNOSIS — E7800 Pure hypercholesterolemia, unspecified: Secondary | ICD-10-CM | POA: Insufficient documentation

## 2015-11-11 DIAGNOSIS — I2510 Atherosclerotic heart disease of native coronary artery without angina pectoris: Secondary | ICD-10-CM | POA: Diagnosis not present

## 2015-11-11 DIAGNOSIS — Z794 Long term (current) use of insulin: Secondary | ICD-10-CM | POA: Diagnosis not present

## 2015-11-11 DIAGNOSIS — E119 Type 2 diabetes mellitus without complications: Secondary | ICD-10-CM | POA: Diagnosis present

## 2015-11-11 DIAGNOSIS — M1990 Unspecified osteoarthritis, unspecified site: Secondary | ICD-10-CM | POA: Insufficient documentation

## 2015-11-11 HISTORY — DX: Malignant neoplasm of prostate: C61

## 2015-11-11 LAB — COMPREHENSIVE METABOLIC PANEL
ALT: 28 U/L (ref 17–63)
AST: 30 U/L (ref 15–41)
Albumin: 3.9 g/dL (ref 3.5–5.0)
Alkaline Phosphatase: 70 U/L (ref 38–126)
Anion gap: 7 (ref 5–15)
BUN: 18 mg/dL (ref 6–20)
CO2: 23 mmol/L (ref 22–32)
Calcium: 9.2 mg/dL (ref 8.9–10.3)
Chloride: 106 mmol/L (ref 101–111)
Creatinine, Ser: 1.28 mg/dL — ABNORMAL HIGH (ref 0.61–1.24)
GFR calc Af Amer: >60 mL/min (ref >60)
GFR calc non Af Amer: 59 mL/min — ABNORMAL LOW (ref >60)
Glucose, Bld: 194 mg/dL — ABNORMAL HIGH (ref 65–99)
Potassium: 4.1 mmol/L (ref 3.5–5.1)
Sodium: 136 mmol/L (ref 135–145)
Total Bilirubin: 0.8 mg/dL (ref 0.3–1.2)
Total Protein: 7.8 g/dL (ref 6.5–8.1)

## 2015-11-11 LAB — APTT: aPTT: 29 seconds (ref 24–36)

## 2015-11-11 LAB — URINALYSIS, ROUTINE W REFLEX MICROSCOPIC
Bilirubin Urine: NEGATIVE
Glucose, UA: 250 mg/dL — AB
Hgb urine dipstick: NEGATIVE
Ketones, ur: NEGATIVE mg/dL
Leukocytes, UA: NEGATIVE
Nitrite: NEGATIVE
Protein, ur: NEGATIVE mg/dL
Specific Gravity, Urine: 1.023 (ref 1.005–1.030)
pH: 6.5 (ref 5.0–8.0)

## 2015-11-11 LAB — CBC
HCT: 41.2 % (ref 39.0–52.0)
Hemoglobin: 14.3 g/dL (ref 13.0–17.0)
MCH: 29.6 pg (ref 26.0–34.0)
MCHC: 34.7 g/dL (ref 30.0–36.0)
MCV: 85.3 fL (ref 78.0–100.0)
Platelets: 287 10*3/uL (ref 150–400)
RBC: 4.83 MIL/uL (ref 4.22–5.81)
RDW: 12.6 % (ref 11.5–15.5)
WBC: 6.3 10*3/uL (ref 4.0–10.5)

## 2015-11-11 LAB — TROPONIN I
Troponin I: 11.79 ng/mL (ref <0.03)
Troponin I: 13.58 ng/mL (ref <0.03)

## 2015-11-11 LAB — MRSA PCR SCREENING: MRSA by PCR: NEGATIVE

## 2015-11-11 LAB — GLUCOSE, CAPILLARY
Glucose-Capillary: 191 mg/dL — ABNORMAL HIGH (ref 65–99)
Glucose-Capillary: 142 mg/dL — ABNORMAL HIGH (ref 65–99)

## 2015-11-11 LAB — I-STAT TROPONIN, ED: Troponin i, poc: 0.08 ng/mL (ref 0.00–0.08)

## 2015-11-11 LAB — PROTIME-INR
INR: 0.91
Prothrombin Time: 12.2 seconds (ref 11.4–15.2)

## 2015-11-11 LAB — LIPASE, BLOOD: Lipase: 25 U/L (ref 11–51)

## 2015-11-11 LAB — D-DIMER, QUANTITATIVE: D-Dimer, Quant: <0.27 ug/mL-FEU (ref 0.00–0.50)

## 2015-11-11 MED ORDER — INFLUENZA VAC SPLIT QUAD 0.5 ML IM SUSY
0.5000 mL | PREFILLED_SYRINGE | INTRAMUSCULAR | Status: AC
  Administered 2015-11-12: 0.5 mL via INTRAMUSCULAR
  Filled 2015-11-11: qty 0.5

## 2015-11-11 MED ORDER — ASPIRIN EC 81 MG PO TBEC
81.0000 mg | DELAYED_RELEASE_TABLET | Freq: Every day | ORAL | Status: DC
  Administered 2015-11-12 – 2015-11-13 (×2): 81 mg via ORAL
  Filled 2015-11-11 (×3): qty 1

## 2015-11-11 MED ORDER — HYDRALAZINE HCL 50 MG PO TABS
50.0000 mg | ORAL_TABLET | Freq: Three times a day (TID) | ORAL | Status: DC
  Administered 2015-11-11 – 2015-11-13 (×5): 50 mg via ORAL
  Filled 2015-11-11 (×5): qty 1

## 2015-11-11 MED ORDER — HEPARIN SODIUM (PORCINE) 5000 UNIT/ML IJ SOLN
5000.0000 [IU] | Freq: Three times a day (TID) | INTRAMUSCULAR | Status: DC
  Administered 2015-11-11: 5000 [IU] via SUBCUTANEOUS
  Filled 2015-11-11: qty 1

## 2015-11-11 MED ORDER — CARVEDILOL 12.5 MG PO TABS: 12.5000 mg | ORAL_TABLET | Freq: Every day | ORAL | Status: DC

## 2015-11-11 MED ORDER — PANTOPRAZOLE SODIUM 40 MG PO TBEC
40.0000 mg | DELAYED_RELEASE_TABLET | Freq: Every day | ORAL | Status: DC
  Administered 2015-11-11 – 2015-11-13 (×3): 40 mg via ORAL
  Filled 2015-11-11 (×2): qty 1

## 2015-11-11 MED ORDER — GI COCKTAIL ~~LOC~~
30.0000 mL | Freq: Four times a day (QID) | ORAL | Status: DC | PRN
  Administered 2015-11-12: 30 mL via ORAL
  Filled 2015-11-11: qty 30

## 2015-11-11 MED ORDER — HYDROCHLOROTHIAZIDE 25 MG PO TABS
12.5000 mg | ORAL_TABLET | Freq: Every day | ORAL | Status: DC
  Administered 2015-11-12 – 2015-11-13 (×2): 12.5 mg via ORAL
  Filled 2015-11-11 (×2): qty 1

## 2015-11-11 MED ORDER — HYDRALAZINE HCL 20 MG/ML IJ SOLN
10.0000 mg | INTRAMUSCULAR | Status: DC | PRN
  Administered 2015-11-11 – 2015-11-12 (×2): 10 mg via INTRAVENOUS
  Filled 2015-11-11 (×2): qty 1

## 2015-11-11 MED ORDER — ONDANSETRON HCL 4 MG/2ML IJ SOLN: 4.0000 mg | Freq: Four times a day (QID) | INTRAMUSCULAR | Status: DC | PRN

## 2015-11-11 MED ORDER — ISOSORBIDE MONONITRATE ER 60 MG PO TB24
60.0000 mg | ORAL_TABLET | Freq: Every day | ORAL | Status: DC
  Administered 2015-11-12 – 2015-11-13 (×2): 60 mg via ORAL
  Filled 2015-11-11 (×2): qty 1

## 2015-11-11 MED ORDER — INSULIN ASPART 100 UNIT/ML ~~LOC~~ SOLN
0.0000 [IU] | Freq: Three times a day (TID) | SUBCUTANEOUS | Status: DC
  Administered 2015-11-11: 2 [IU] via SUBCUTANEOUS
  Administered 2015-11-12 – 2015-11-13 (×3): 1 [IU] via SUBCUTANEOUS

## 2015-11-11 MED ORDER — LISINOPRIL 10 MG PO TABS
10.0000 mg | ORAL_TABLET | Freq: Every day | ORAL | Status: DC
  Administered 2015-11-12 – 2015-11-13 (×2): 10 mg via ORAL
  Filled 2015-11-11 (×2): qty 1

## 2015-11-11 MED ORDER — ACETAMINOPHEN 325 MG PO TABS
650.0000 mg | ORAL_TABLET | ORAL | Status: DC | PRN
  Administered 2015-11-11 – 2015-11-13 (×3): 650 mg via ORAL
  Filled 2015-11-11 (×3): qty 2

## 2015-11-11 MED ORDER — HEPARIN (PORCINE) IN NACL 100-0.45 UNIT/ML-% IJ SOLN
1000.0000 [IU]/h | INTRAMUSCULAR | Status: DC
  Administered 2015-11-11: 1000 [IU]/h via INTRAVENOUS
  Filled 2015-11-11: qty 250

## 2015-11-11 MED ORDER — INSULIN GLARGINE 100 UNIT/ML ~~LOC~~ SOLN
15.0000 [IU] | Freq: Every day | SUBCUTANEOUS | Status: DC
  Administered 2015-11-11 – 2015-11-12 (×2): 15 [IU] via SUBCUTANEOUS
  Filled 2015-11-11 (×3): qty 0.15

## 2015-11-11 NOTE — Progress Notes (Addendum)
Shift event note:  Notified by RN regarding elevated troponin of 11.79. Pt currently denies CP. VSS. 12-lead EKG done and reveals NSR w/ Left axis deviation and LVH with QRS widening and T wave abnormality. Discussed pt w/ Dr Radford Pax w/ cardiology service who has requested IV heparin be started and pt transferred to [facility]. Pt to remain NPO. Saw pt at bedside. Noted in NAD. Continues to deny CP. BBS CTA.  Discussed new findings and cardiology recommendations. My colleague, Dr Loleta Books at bedside as well. Pt in agreement w/ transfer to [facility]. Orders placed for heparin qtt and transfer to [facility]. My colleagues on [place] notified as well. Cardiology to be paged upon pt's arrival to [facility]. Will continue to monitor closely on telemetry while awaiting bed assignment at [facility].   Jeryl Columbia, NP-C [facility] Pager [phone]  0230:  Troponin's ordered q3h vs q6h as per rountine. First troponin 11.79, second 13.58 and third 12.48. Order placed for repeat troponin at 0630 on 11/12/2015. Pt remains CP free awaiting tx to [facility].  Jeryl Columbia, NP

## 2015-11-11 NOTE — Progress Notes (Signed)
ANTICOAGULATION CONSULT NOTE - Initial Consult  Pharmacy Consult for Heparin Indication: chest pain/ACS  Allergies  Allergen Reactions  . Ambien [Zolpidem Tartrate] Other (See Comments)    Pt states that this medication made him go crazy.   . Metformin And Related Diarrhea    Patient Measurements: Height: 5\' 5"  (165.1 cm) Weight: 184 lb (83.5 kg) IBW/kg (Calculated) : 61.5 Heparin Dosing Weight:   Vital Signs: Temp: 98 F (36.7 C) (10/15 2142) Temp Source: Oral (10/15 2142) BP: 169/104 (10/15 2142) Pulse Rate: 92 (10/15 2142)  Labs:  Recent Labs  11/10/15 0329 11/11/15 1027 11/11/15 1830 11/11/15 2111  HGB 14.0 14.3  --   --   HCT 40.8 41.2  --   --   PLT 266 287  --   --   CREATININE 1.43* 1.28*  --   --   TROPONINI  --   --  11.79* 13.58*    Estimated Creatinine Clearance: 60.3 mL/min (by C-G formula based on SCr of 1.28 mg/dL (H)).   Medical History: Past Medical History:  Diagnosis Date  . Arthritis    lower back   . Asthma    years ago   . CAD    nonobst cath 2004  . Cancer [facility])    prostate cancer  . CARDIOMYOPATHY    NICM, LVEF 30% 01/2011 echo; 20-25% 07/2012 echo  . CHF (congestive heart failure) ([place])   . DIABETES MELLITUS, TYPE II   . HYPERCHOLESTEROLEMIA   . HYPERTENSION   . Noncompliance   . PSA, INCREASED   . PVCs (premature ventricular contractions)    s/p ablation 01-07-2013 by Dr Rayann Heman    Medications:  Infusions:  . heparin      Assessment: Patient with ACS and + CE.  No oral anticoagulants noted on med rec.   Baseline PTT, PT/INR ordered.   Heparin 5000 units sq x1 already charted.  Therefore no heparin bolus.  Goal of Therapy:  Heparin level 0.3-0.7 units/ml Monitor platelets by anticoagulation protocol: Yes   Plan:  Heparin drip at 1000 units/hr Daily CBC Next heparin level at 0800    Tyler Deis, [facility] [place] 11/11/2015,10:05 PM

## 2015-11-11 NOTE — ED Provider Notes (Signed)
[place] DEPT Provider Note   CSN: [id number] Arrival date & time: 11/11/15  [id number]     History   Chief Complaint Chief Complaint  Patient presents with  . Abdominal Pain  . Back Pain  . Neck Pain    HPI [name] is a 60 y.o. male.  The history is provided by the patient. No language interpreter was used.  Abdominal Pain    Back Pain   Associated symptoms include abdominal pain.  Neck Pain     [name] is a 60 y.o. male who presents to the Emergency Department complaining of chest pain.  He reports a sharp and stabbing central chest pain that radiates to bilateral shoulders and back. Pain began this morning and woke him from sleep and lasted for about 1 hour. He took a Journal[employer], newspaper with an improvement in his symptoms. He had a similar episode yesterday and was seen in the emergency department and discharged home. He denies any fevers, diaphoresis, dyspnea, lower extremity swelling or pain. No abdominal pain, nausea, vomiting. No prior similar symptoms. He has a history of cardiomyopathy and hypertension. Past Medical History:  Diagnosis Date  . Arthritis    lower back   . Asthma    years ago   . CAD    nonobst cath 2004  . Cancer [facility])    prostate cancer  . CARDIOMYOPATHY    NICM, LVEF 30% 01/2011 echo; 20-25% 07/2012 echo  . CHF (congestive heart failure) ([place])   . DIABETES MELLITUS, TYPE II   . HYPERCHOLESTEROLEMIA   . HYPERTENSION   . Noncompliance   . PSA, INCREASED   . PVCs (premature ventricular contractions)    s/p ablation 01-07-2013 by Dr Rayann Heman    Patient Active Problem List   Diagnosis Date Noted  . Acute kidney injury ([place]) 07/19/2015  . Dyspnea 07/19/2015  . AKI (acute kidney injury) ([place]) 07/19/2015  . Chest pain 07/12/2015  . Hyperglycemia 07/12/2015  . Thigh pain 10/25/2014  . Prostate cancer ([place]) 02/22/2014  . Cholelithiasis and cholecystitis without obstruction 11/16/2013  . PVC's (premature ventricular  contractions) 01/07/2013  . CKD (chronic kidney disease) stage 2, GFR 60-89 ml/min 08/25/2012  . OSA (obstructive sleep apnea) 08/25/2012  . Chronic systolic heart failure ([place]) 08/06/2012  . HTN (hypertension), malignant 02/12/2011  . Non compliance w medication regimen 02/12/2011  . HYPERCHOLESTEROLEMIA 09/17/2009  . PREMATURE VENTRICULAR CONTRACTIONS 09/17/2009  . PSA, INCREASED 04/26/2009  . Diabetes type 2, uncontrolled ([place]) 04/25/2009  . CARDIOMYOPATHY 04/25/2009  . CAD 04/20/2009    Past Surgical History:  Procedure Laterality Date  . ABLATION  01-07-2013   RVOT PVC's ablated by Dr Rayann Heman along anteroseptal RVOT  . CARDIAC CATHETERIZATION  2004   nonobst dz  . CHOLECYSTECTOMY N/A 11/16/2013   Procedure: LAPAROSCOPIC CHOLECYSTECTOMY WITH INTRAOPERATIVE CHOLANGIOGRAM;  Surgeon: [name], MD;  [facility];  Service: General;  Laterality: N/A;  . LYMPHADENECTOMY Bilateral 02/22/2014   Procedure: PELVIC LYMPH NODE DISSECTION;  Surgeon: [name], MD;  Location[facility];  Service: Urology;  Laterality: Bilateral;  . ROBOT ASSISTED LAPAROSCOPIC RADICAL PROSTATECTOMY N/A 02/22/2014   Procedure: ROBOTIC ASSISTED LAPAROSCOPIC RADICAL PROSTATECTOMY WITH INDOCYANINE GREEN DYE AND OPEN UMBILICAL HERNIA REPAIR;  Surgeon: [name], MD;  [facility];  Service: Urology;  Laterality: N/A;  . SUPRAVENTRICULAR TACHYCARDIA ABLATION N/A 09/28/2012   Procedure: Tanna Furry Ablation;  Surgeon: [name], MD;  Location: [facility] CATH LAB;  Service: Cardiovascular;  Laterality: N/A;  . SURGERY SCROTAL /  TESTICULAR     at age 75  . V-TACH ABLATION N/A 01/07/2013   Procedure: V-TACH ABLATION;  Surgeon: [name], MD;  Location: [facility] CATH LAB;  Service: Cardiovascular;  Laterality: N/A;       Home Medications    Prior to Admission medications   Medication Sig Start Date End Date Taking? Authorizing Provider  Aspirin-Acetaminophen-Caffeine (GOODY HEADACHE PO) Take 1 packet by  mouth 2 (two) times daily as needed (for pain/headache).    Yes Historical Provider, MD  carvedilol (COREG) 12.5 MG tablet Take 1 tablet (12.5 mg total) by mouth daily. 07/10/15  Yes Lacretia Leigh, MD  hydrALAZINE (APRESOLINE) 50 MG tablet Take 1 tablet (50 mg total) by mouth 3 (three) times daily. 07/10/15  Yes Lacretia Leigh, MD  hydrochlorothiazide (HYDRODIURIL) 12.5 MG tablet Take 1 tablet (12.5 mg total) by mouth daily. 08/02/15  Yes Almyra Deforest, PA  insulin glargine (LANTUS) 100 unit/mL SOPN Inject 20 Units into the skin at bedtime.   Yes Historical Provider, MD  isosorbide mononitrate (IMDUR) 60 MG 24 hr tablet Take 1 tablet (60 mg total) by mouth daily. 07/10/15  Yes Lacretia Leigh, MD  lisinopril (PRINIVIL,ZESTRIL) 10 MG tablet Take 1 tablet (10 mg total) by mouth daily. 08/02/15  Yes Almyra Deforest, PA    Family History Family History  Problem Relation Age of Onset  . Hypertension Mother   . Peripheral vascular disease Mother   . Peripheral vascular disease    . Hypertension    . Prostate cancer    . Prostate cancer Brother   . Heart attack Paternal Grandfather   . Heart failure Maternal Grandmother     Social History Social History  Substance Use Topics  . Smoking status: Never Smoker  . 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 Alcohol use No     Comment: rare     Allergies   Ambien [zolpidem tartrate] and Metformin and related   Review of Systems Review of Systems  Gastrointestinal: Positive for abdominal pain.  Musculoskeletal: Positive for back pain and neck pain.  All other systems reviewed and are negative.    Physical Exam Updated Vital Signs BP (!) 188/134 (BP Location: Left Arm)   Pulse 63   Temp 98.5 F (36.9 C) (Oral)   Resp 18   SpO2 100%   Physical Exam  Constitutional: He is oriented to person, place, and time. He appears well-developed and well-nourished.  HENT:  Head: Normocephalic and  atraumatic.  Cardiovascular: Normal rate and regular rhythm.   No murmur heard. Pulmonary/Chest: Effort normal and breath sounds normal. No respiratory distress.  Abdominal: Soft. There is no tenderness. There is no rebound and no guarding.  Musculoskeletal: He exhibits no edema or tenderness.  Neurological: He is alert and oriented to person, place, and time.  Skin: Skin is warm and dry.  Psychiatric: He has a normal mood and affect. His behavior is normal.  Nursing note and vitals reviewed.    ED Treatments / Results  Labs (all labs ordered are listed, but only abnormal results are displayed) Labs Reviewed  COMPREHENSIVE METABOLIC PANEL - Abnormal; Notable for the following:       Result Value   Glucose, Bld 194 (*)    Creatinine, Ser 1.28 (*)    GFR calc non Af Amer 59 (*)    All other components within normal limits  LIPASE, BLOOD  CBC  URINALYSIS, ROUTINE  W REFLEX MICROSCOPIC (NOT AT [facility])  D-DIMER, QUANTITATIVE (NOT AT [facility])  Randolm Idol, ED    EKG  EKG Interpretation  Date/Time:  Sunday November 11 2015 10:58:42 EDT Ventricular Rate:  60 PR Interval:    QRS Duration: 118 QT Interval:  452 QTC Calculation: 452 R Axis:   -62 Text Interpretation:  Sinus rhythm LVH with IVCD, LAD and secondary repol abnrm No significant change since last tracing Confirmed by Winfred Leeds  MD, SAM [phone]) on 11/11/2015 11:03:34 AM       Radiology Dg Chest 2 View  Result Date: 11/10/2015 CLINICAL DATA:  Intermittent chest pain, onset last night. EXAM: CHEST  2 VIEW COMPARISON:  07/19/2015 FINDINGS: The cardiomediastinal contours are normal. The lungs are clear. Pulmonary vasculature is normal. No consolidation, pleural effusion, or pneumothorax. No acute osseous abnormalities are seen. IMPRESSION: No acute pulmonary process. Electronically Signed   By: Jeb Levering M.D.   On: 11/10/2015 04:14    Procedures Procedures (including critical care time)  Medications Ordered in  ED Medications - No data to display   Initial Impression / Assessment and Plan / ED Course  I have reviewed the triage vital signs and the nursing notes.  Pertinent labs & imaging results that were available during my care of the patient were reviewed by me and considered in my medical decision making (see chart for details).  Clinical Course    Patient here for evaluation of episodic chest pain, resolved in the emergency department. Presentation is not consistent with PE or dissection. Plan to admit for observation given recurrent chest pain.  Final Clinical Impressions(s) / ED Diagnoses   Final diagnoses:  Chest pain, unspecified type    New Prescriptions New Prescriptions   No medications on file     Quintella Reichert, MD 11/11/15 1843

## 2015-11-11 NOTE — ED Notes (Signed)
Bed: WTR5 Expected date:  Expected time:  Means of arrival:  Comments: 

## 2015-11-11 NOTE — Progress Notes (Addendum)
Pt with chest pain.  Both typical and atypical features. EKG with chronic lateral TWI and worsening inferior ST depression.  IVCD at baseline.  Does not meet ST segment elevation criteria.  Proceed with myoview.  If low risk, follow-up with cardiology in the office.  If moderate or high risk, cardiology consultation tomorrow.  [name] MD, [facility] 11/11/2015 4:44 PM  Addendum Pt has ruled in for MI.  Overnight, Dr Radford Pax contacted.  She has made arrangements for patient to be transferred to [facility] for formal cardiology consultation and cath.  Myoview cancelled.  I have spoken with IM team this am.  Pt is currently pain free.  [name] MD, [facility] 11/12/2015 6:16 AM

## 2015-11-11 NOTE — ED Triage Notes (Signed)
Patient c/o epigastric pain and upper back pain that radiates around neck and ears to right arm.  Patient states that it aches.  Sometimes relieved with walking per patient. Patient states tht he was seen at [facility] yesterday and said it was "nothing, but I know I aint crazy."

## 2015-11-11 NOTE — Progress Notes (Signed)
CRITICAL VALUE ALERT  Critical value received:  Trop 11.79  Date of notification:  11/11/15  Time of notification:  19:49  Critical value read back:Yes.    Nurse who received alert:  Ruben Im, RN  MD notified (1st page):  K Schorr  Time of first page:  19:50  MD notified (2nd page):  Time of second page:  Responding MD:  Lamar Blinks  Time MD responded:  20:11

## 2015-11-11 NOTE — ED Notes (Signed)
Pt in waiting room -  Reported 0.8 troponin to Dr Ralene Bathe.

## 2015-11-11 NOTE — H&P (Signed)
TRH H&P   Patient Demographics:    [name], is a 60 y.o. male  MRN: [id number]   DOB - [birth year]  Admit Date - 11/11/2015  Outpatient Primary MD for the patient is Mauricio Po, FNP  Referring MD/NP/PA: Dr. Ralene Bathe  Outpatient Specialists: Corine Shelter. Tempie Hoist, EP Dr Rayann Heman  Patient coming from: Home  Chief Complaint  Patient presents with  . Abdominal Pain  . Back Pain  . Neck Pain      HPI:    [name]  is a 60 y.o. male, with PMH of HTN, PVC s/p RVOT ablation, chronic systolic HF, CKD stage III, h/o nonobstructive CAD, and borderline DM II , patient presents with complaints of chest pain, reports his midsternal, radiating to the neck and back, reports is intermittent, has been more progressive, was yesterday in ED for similar presentation with negative troponins sent home, reported to greatest powder today, with resolution of chest pain, no recurrence, he denies any abdominal pain, nausea, vomiting, diaphoresis, dyspnea or palpitation, with known history of cardiomyopathy in the past, but most recent echo showing EF 55-60%, hospitalist requested to admit for further workup, troponin is negative, EKG nonacute, [place] lateral T-wave inversion    Review of systems:    In addition to the HPI above,  No Fever-chills, No Headache, No changes with Vision or hearing, No problems swallowing food or Liquids, And planes of chest pain, denies Cough or Shortness of Breath, No Abdominal pain, No Nausea or Vommitting, Bowel movements are regular, No Blood in stool or Urine, No dysuria, No new skin rashes or bruises, No new joints pains-aches,  No new weakness, tingling, numbness in any extremity, No recent weight gain or loss, No polyuria, polydypsia or polyphagia, No significant Mental Stressors.  A full 10 point Review of Systems was done, except as stated above,  all other Review of Systems were negative.   With Past History of the following :    Past Medical History:  Diagnosis Date  . Arthritis    lower back   . Asthma    years ago   . CAD    nonobst cath 2004  . Cancer [facility])    prostate cancer  . CARDIOMYOPATHY    NICM, LVEF 30% 01/2011 echo; 20-25% 07/2012 echo  . CHF (congestive heart failure) ([place])   . DIABETES MELLITUS, TYPE II   . HYPERCHOLESTEROLEMIA   . HYPERTENSION   . Noncompliance   . PSA, INCREASED   . PVCs (premature ventricular contractions)    s/p ablation 01-07-2013 by Dr Rayann Heman      Past Surgical History:  Procedure Laterality Date  . ABLATION  01-07-2013   RVOT PVC's ablated by Dr Rayann Heman along anteroseptal RVOT  . CARDIAC CATHETERIZATION  2004   nonobst dz  . CHOLECYSTECTOMY N/A 11/16/2013   Procedure: LAPAROSCOPIC CHOLECYSTECTOMY WITH INTRAOPERATIVE CHOLANGIOGRAM;  Surgeon: [name], MD;  Location:  [facility];  Service: General;  Laterality: N/A;  . LYMPHADENECTOMY Bilateral 02/22/2014   Procedure: PELVIC LYMPH NODE DISSECTION;  Surgeon: [name], MD;  Location: [facility];  Service: Urology;  Laterality: Bilateral;  . ROBOT ASSISTED LAPAROSCOPIC RADICAL PROSTATECTOMY N/A 02/22/2014   Procedure: ROBOTIC ASSISTED LAPAROSCOPIC RADICAL PROSTATECTOMY WITH INDOCYANINE GREEN DYE AND OPEN UMBILICAL HERNIA REPAIR;  Surgeon: [name], MD;  Location: [facility];  Service: Urology;  Laterality: N/A;  . SUPRAVENTRICULAR TACHYCARDIA ABLATION N/A 09/28/2012   Procedure: Tanna Furry Ablation;  Surgeon: [name], MD;  Location: [facility] C[facility] LAB;  Service: Cardiovascular;  Laterality: N/A;  . SURGERY SCROTAL / TESTICULAR     at age 72  . V-TACH ABLATION N/A 01/07/2013   Procedure: V-TACH ABLATION;  Surgeon: [name], MD;  Location: [place] CATH LAB;  Service: Cardiovascular;  Laterality: N/A;      Social History:     Social History  Substance Use Topics  . Smoking status: Never Smoker  . 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 Alcohol use No     Comment: rare     Lives - At Home  Mobility - independent     Family History :     Family History  Problem Relation Age of Onset  . Hypertension Mother   . Peripheral vascular disease Mother   . Peripheral vascular disease    . Hypertension    . Prostate cancer    . Prostate cancer Brother   . Heart attack Paternal Grandfather   . Heart failure Maternal Grandmother       Home Medications:   Prior to Admission medications   Medication Sig Start Date End Date Taking? Authorizing Provider  Aspirin-Acetaminophen-Caffeine (GOODY HEADACHE PO) Take 1 packet by mouth 2 (two) times daily as needed (for pain/headache).    Yes Historical Provider, MD  carvedilol (COREG) 12.5 MG tablet Take 1 tablet (12.5 mg total) by mouth daily. 07/10/15  Yes Lacretia Leigh, MD  hydrALAZINE (APRESOLINE) 50 MG tablet Take 1 tablet (50 mg total) by mouth 3 (three) times daily. 07/10/15  Yes Lacretia Leigh, MD  hydrochlorothiazide (HYDRODIURIL) 12.5 MG tablet Take 1 tablet (12.5 mg total) by mouth daily. 08/02/15  Yes Almyra Deforest, PA  insulin glargine (LANTUS) 100 unit/mL SOPN Inject 20 Units into the skin at bedtime.   Yes Historical Provider, MD  isosorbide mononitrate (IMDUR) 60 MG 24 hr tablet Take 1 tablet (60 mg total) by mouth daily. 07/10/15  Yes Lacretia Leigh, MD  lisinopril (PRINIVIL,ZESTRIL) 10 MG tablet Take 1 tablet (10 mg total) by mouth daily. 08/02/15  Yes Almyra Deforest, PA     Allergies:     Allergies  Allergen Reactions  . Ambien [Zolpidem Tartrate] Other (See Comments)    Pt states that this medication made him go crazy.   . Metformin And Related Diarrhea     Physical Exam:   Vitals  Blood pressure (!) 172/92, pulse 63, temperature 98.2 F (36.8 C), temperature source Oral, resp. rate 18, height 5\' 5"  (1.651 m), weight 83.5 kg (184 lb), SpO2 100 %.   1. General Well-developed male lying in bed in  NAD,    2. Normal affect and insight, Not Suicidal or Homicidal, Awake Alert, Oriented X 3.  3. No F.N deficits, ALL C.Nerves Intact, Strength 5/5 all 4 extremities, Sensation intact all 4 extremities, Plantars down going.  4. Ears and Eyes appear Normal, Conjunctivae  clear, PERRLA. Moist Oral Mucosa.  5. Supple Neck, No JVD, No cervical lymphadenopathy appriciated, No Carotid Bruits.  6. Symmetrical Chest wall movement, Good air movement bilaterally, CTAB.  7. RRR, No Gallops, Rubs or Murmurs, No Parasternal Heave.  8. Positive Bowel Sounds, Abdomen Soft, No tenderness, No organomegaly appriciated,No rebound -guarding or rigidity.  9.  No Cyanosis, Normal Skin Turgor, No Skin Rash or Bruise.  10. Good muscle tone,  joints appear normal , no effusions, Normal ROM.  11. No Palpable Lymph Nodes in Neck or Axillae    Data Review:    CBC  Recent Labs Lab 11/10/15 0329 11/11/15 1027  WBC 8.3 6.3  HGB 14.0 14.3  HCT 40.8 41.2  PLT 266 287  MCV 87.2 85.3  MCH 29.9 29.6  MCHC 34.3 34.7  RDW 12.7 12.6   ------------------------------------------------------------------------------------------------------------------  Chemistries   Recent Labs Lab 11/10/15 0329 11/11/15 1027  NA 135 136  K 3.8 4.1  CL 103 106  CO2 23 23  GLUCOSE 197* 194*  BUN 17 18  CREATININE 1.43* 1.28*  CALCIUM 9.5 9.2  AST  --  30  ALT  --  28  ALKPHOS  --  70  BILITOT  --  0.8   ------------------------------------------------------------------------------------------------------------------ estimated creatinine clearance is 60.3 mL/min (by C-G formula based on SCr of 1.28 mg/dL (H)). ------------------------------------------------------------------------------------------------------------------ No results for input(s): TSH, T4TOTAL, T3FREE, THYROIDAB in the last 72 hours.  Invalid input(s): FREET3  Coagulation profile No results for input(s): INR, PROTIME in the last 168  hours. -------------------------------------------------------------------------------------------------------------------  Recent Labs  11/11/15 1030  DDIMER <0.27   -------------------------------------------------------------------------------------------------------------------  Cardiac Enzymes No results for input(s): CKMB, TROPONINI, MYOGLOBIN in the last 168 hours.  Invalid input(s): CK ------------------------------------------------------------------------------------------------------------------    Component Value Date/Time   BNP 11.6 07/19/2015 1046     ---------------------------------------------------------------------------------------------------------------  Urinalysis    Component Value Date/Time   COLORURINE YELLOW 07/19/2015 1331   APPEARANCEUR CLEAR 07/19/2015 1331   LABSPEC 1.023 07/19/2015 1331   PHURINE 5.5 07/19/2015 1331   GLUCOSEU >1000 (A) 07/19/2015 1331   HGBUR NEGATIVE 07/19/2015 1331   BILIRUBINUR NEGATIVE 07/19/2015 1331   KETONESUR NEGATIVE 07/19/2015 1331   PROTEINUR NEGATIVE 07/19/2015 1331   UROBILINOGEN 0.2 02/12/2011 0804   NITRITE NEGATIVE 07/19/2015 1331   LEUKOCYTESUR NEGATIVE 07/19/2015 1331    ----------------------------------------------------------------------------------------------------------------   Imaging Results:    Dg Chest 2 View  Result Date: 11/10/2015 CLINICAL DATA:  Intermittent chest pain, onset last night. EXAM: CHEST  2 VIEW COMPARISON:  07/19/2015 FINDINGS: The cardiomediastinal contours are normal. The lungs are clear. Pulmonary vasculature is normal. No consolidation, pleural effusion, or pneumothorax. No acute osseous abnormalities are seen. IMPRESSION: No acute pulmonary process. Electronically Signed   By: Jeb Levering M.D.   On: 11/10/2015 04:14    My personal review of EKG: Rhythm NSR, Rate 60  /min, QTc452  , no Acute ST changes( chronic lateral TWI)   Assessment & Plan:    Active  Problems:   Diabetes type 2, uncontrolled (HCC)   Coronary atherosclerosis   HTN (hypertension), malignant   Chronic systolic heart failure (HCC)   CKD (chronic kidney disease) stage 2, GFR 60-89 ml/min   OSA (obstructive sleep apnea)   Chest pain  Chest pain - Has some typical and nontypical features, so far negative troponins, as well yesterday in ED with similar presentation with negative troponins, EKG nonacute, but given his risk factors and be admitted overnight for observation, will monitor him on telemetry, cycle cardiac  enzymes, 2-D echo recently done, so now need to repeat, discussed case with cardiology Dr.Allred, plan for nuclear stress test in a.m., if abnormal then cardiology will consult officially. - Will start on aspirin - D-dimer is within normal limits - Continue GI cocktail, start on Protonix, giving some GI features of his pain  Hypertension - Uncontrolled on admission, will continue with home medication and start on when necessary hydralazine  CKD stage II - Baseline, continue to monitor  Diabetes mellitus type 2 - We'll resume on Lantus(lower dose from 20-15), and will start on insulin sliding scale, will check hemoglobin [id number]  Chronic systolic heart failure - Per history, appears euvolemic, most recent echo in June 2017 EF of 50-55% - Continue with lisinopril  DVT Prophylaxis Heparin  SCDs  AM Labs Ordered, also please review Full Orders  Family Communication: Admission, patients condition and plan of care including tests being ordered have been discussed with the patient and Wife who indicate understanding and agree with the plan and Code Status.  Code Status Full  Likely DC to  Home  Condition GUARDED    Consults called: None  Admission status: obs  Time spent in minutes : 55 Minutes   Asier Desroches M.D on 11/11/2015 at 4:30 PM  Between 7am to 7pm - Pager - [phone]. After 7pm go to [email or web address] - password [facility]  [facility] -  Office  [phone]

## 2015-11-12 ENCOUNTER — Other Ambulatory Visit (HOSPITAL_COMMUNITY): Payer: BLUE CROSS/BLUE SHIELD

## 2015-11-12 ENCOUNTER — Encounter (HOSPITAL_COMMUNITY)
Admission: EM | Disposition: A | Discharge status: Home / Self Care | Payer: Self-pay | Source: Home / Self Care | Attending: Emergency Medicine

## 2015-11-12 DIAGNOSIS — I5022 Chronic systolic (congestive) heart failure: Secondary | ICD-10-CM | POA: Diagnosis not present

## 2015-11-12 DIAGNOSIS — E1122 Type 2 diabetes mellitus with diabetic chronic kidney disease: Secondary | ICD-10-CM | POA: Diagnosis not present

## 2015-11-12 DIAGNOSIS — E1100 Type 2 diabetes mellitus with hyperosmolarity without nonketotic hyperglycemic-hyperosmolar coma (NKHHC): Secondary | ICD-10-CM | POA: Diagnosis not present

## 2015-11-12 DIAGNOSIS — I130 Hypertensive heart and chronic kidney disease with heart failure and stage 1 through stage 4 chronic kidney disease, or unspecified chronic kidney disease: Secondary | ICD-10-CM | POA: Diagnosis not present

## 2015-11-12 DIAGNOSIS — I2510 Atherosclerotic heart disease of native coronary artery without angina pectoris: Secondary | ICD-10-CM | POA: Diagnosis not present

## 2015-11-12 DIAGNOSIS — I214 Non-ST elevation (NSTEMI) myocardial infarction: Secondary | ICD-10-CM | POA: Diagnosis not present

## 2015-11-12 HISTORY — PX: CARDIAC CATHETERIZATION: SHX172

## 2015-11-12 LAB — BASIC METABOLIC PANEL
Anion gap: 5 (ref 5–15)
BUN: 21 mg/dL — ABNORMAL HIGH (ref 6–20)
CO2: 22 mmol/L (ref 22–32)
Calcium: 8.9 mg/dL (ref 8.9–10.3)
Chloride: 108 mmol/L (ref 101–111)
Creatinine, Ser: 1.35 mg/dL — ABNORMAL HIGH (ref 0.61–1.24)
GFR calc Af Amer: >60 mL/min (ref >60)
GFR calc non Af Amer: 55 mL/min — ABNORMAL LOW (ref >60)
Glucose, Bld: 173 mg/dL — ABNORMAL HIGH (ref 65–99)
Potassium: 3.8 mmol/L (ref 3.5–5.1)
Sodium: 135 mmol/L (ref 135–145)

## 2015-11-12 LAB — CBC
HCT: 39.5 % (ref 39.0–52.0)
Hemoglobin: 13.9 g/dL (ref 13.0–17.0)
MCH: 30.2 pg (ref 26.0–34.0)
MCHC: 35.2 g/dL (ref 30.0–36.0)
MCV: 85.7 fL (ref 78.0–100.0)
Platelets: 269 10*3/uL (ref 150–400)
RBC: 4.61 MIL/uL (ref 4.22–5.81)
RDW: 12.8 % (ref 11.5–15.5)
WBC: 12.8 10*3/uL — ABNORMAL HIGH (ref 4.0–10.5)

## 2015-11-12 LAB — GLUCOSE, CAPILLARY
Glucose-Capillary: 146 mg/dL — ABNORMAL HIGH (ref 65–99)
Glucose-Capillary: 147 mg/dL — ABNORMAL HIGH (ref 65–99)
Glucose-Capillary: 125 mg/dL — ABNORMAL HIGH (ref 65–99)
Glucose-Capillary: 121 mg/dL — ABNORMAL HIGH (ref 65–99)

## 2015-11-12 LAB — TROPONIN I
Troponin I: 12.48 ng/mL (ref <0.03)
Troponin I: 7.82 ng/mL (ref <0.03)

## 2015-11-12 LAB — LIPID PANEL
Cholesterol: 177 mg/dL (ref 0–200)
HDL: 40 mg/dL — ABNORMAL LOW (ref >40)
LDL Cholesterol: 117 mg/dL — ABNORMAL HIGH (ref 0–99)
Total CHOL/HDL Ratio: 4.4 RATIO
Triglycerides: 102 mg/dL (ref <150)
VLDL: 20 mg/dL (ref 0–40)

## 2015-11-12 LAB — HEPARIN LEVEL (UNFRACTIONATED): Heparin Unfractionated: 0.58 IU/mL (ref 0.30–0.70)

## 2015-11-12 SURGERY — LEFT HEART CATH AND CORONARY ANGIOGRAPHY

## 2015-11-12 MED ORDER — LIDOCAINE HCL (PF) 1 % IJ SOLN
INTRAMUSCULAR | Status: DC | PRN
  Administered 2015-11-12: 3 mL

## 2015-11-12 MED ORDER — HEPARIN (PORCINE) IN NACL 2-0.9 UNIT/ML-% IJ SOLN
INTRAMUSCULAR | Status: AC
  Filled 2015-11-12: qty 1000

## 2015-11-12 MED ORDER — HEPARIN SODIUM (PORCINE) 1000 UNIT/ML IJ SOLN
INTRAMUSCULAR | Status: DC | PRN
  Administered 2015-11-12: 5000 [IU] via INTRAVENOUS

## 2015-11-12 MED ORDER — SODIUM CHLORIDE 0.9 % IV SOLN: INTRAVENOUS | Status: AC

## 2015-11-12 MED ORDER — FENTANYL CITRATE (PF) 100 MCG/2ML IJ SOLN
INTRAMUSCULAR | Status: DC | PRN
  Administered 2015-11-12: 25 ug via INTRAVENOUS

## 2015-11-12 MED ORDER — NITROGLYCERIN 1 MG/10 ML FOR IR/CATH LAB
INTRA_ARTERIAL | Status: AC
  Filled 2015-11-12: qty 10

## 2015-11-12 MED ORDER — CARVEDILOL 25 MG PO TABS
25.0000 mg | ORAL_TABLET | Freq: Every day | ORAL | Status: DC
  Administered 2015-11-13: 25 mg via ORAL
  Filled 2015-11-12: qty 1

## 2015-11-12 MED ORDER — MIDAZOLAM HCL 2 MG/2ML IJ SOLN
INTRAMUSCULAR | Status: AC
  Filled 2015-11-12: qty 2

## 2015-11-12 MED ORDER — SODIUM CHLORIDE 0.9 % IV SOLN: 250.0000 mL | INTRAVENOUS | Status: DC | PRN

## 2015-11-12 MED ORDER — SODIUM CHLORIDE 0.9 % IV SOLN: INTRAVENOUS | Status: DC

## 2015-11-12 MED ORDER — HEPARIN (PORCINE) IN NACL 2-0.9 UNIT/ML-% IJ SOLN
INTRAMUSCULAR | Status: DC | PRN
  Administered 2015-11-12: 1000 mL

## 2015-11-12 MED ORDER — ATORVASTATIN CALCIUM 80 MG PO TABS
80.0000 mg | ORAL_TABLET | Freq: Every day | ORAL | Status: DC
  Administered 2015-11-12: 80 mg via ORAL
  Filled 2015-11-12: qty 1

## 2015-11-12 MED ORDER — LIDOCAINE HCL (PF) 1 % IJ SOLN
INTRAMUSCULAR | Status: AC
  Filled 2015-11-12: qty 30

## 2015-11-12 MED ORDER — SODIUM CHLORIDE 0.9% FLUSH: 3.0000 mL | INTRAVENOUS | Status: DC | PRN

## 2015-11-12 MED ORDER — VERAPAMIL HCL 2.5 MG/ML IV SOLN
INTRAVENOUS | Status: AC
  Filled 2015-11-12: qty 2

## 2015-11-12 MED ORDER — VERAPAMIL HCL 2.5 MG/ML IV SOLN
INTRAVENOUS | Status: DC | PRN
  Administered 2015-11-12: 10 mL via INTRA_ARTERIAL

## 2015-11-12 MED ORDER — SODIUM CHLORIDE 0.9% FLUSH: 3.0000 mL | Freq: Two times a day (BID) | INTRAVENOUS | Status: DC

## 2015-11-12 MED ORDER — MIDAZOLAM HCL 2 MG/2ML IJ SOLN
INTRAMUSCULAR | Status: DC | PRN
  Administered 2015-11-12: 1 mg via INTRAVENOUS

## 2015-11-12 MED ORDER — ASPIRIN 81 MG PO CHEW: 81.0000 mg | CHEWABLE_TABLET | ORAL | Status: AC

## 2015-11-12 MED ORDER — HEPARIN SODIUM (PORCINE) 1000 UNIT/ML IJ SOLN
INTRAMUSCULAR | Status: AC
  Filled 2015-11-12: qty 1

## 2015-11-12 MED ORDER — FENTANYL CITRATE (PF) 100 MCG/2ML IJ SOLN
INTRAMUSCULAR | Status: AC
  Filled 2015-11-12: qty 2

## 2015-11-12 SURGICAL SUPPLY — 10 items
CATH 5FR JL3.5 JR4 ANG PIG MP (CATHETERS) ×1 IMPLANT
DEVICE RAD COMP TR BAND LRG (VASCULAR PRODUCTS) ×1 IMPLANT
GLIDESHEATH SLEND SS 6F .021 (SHEATH) ×1 IMPLANT
GUIDEWIRE 3MM J .035 260CM (WIRE) ×1 IMPLANT
KIT HEART LEFT (KITS) ×2 IMPLANT
PACK CARDIAC CATHETERIZATION (CUSTOM PROCEDURE TRAY) ×2 IMPLANT
SYR MEDRAD MARK V 150ML (SYRINGE) ×2 IMPLANT
TRANSDUCER W/STOPCOCK (MISCELLANEOUS) ×2 IMPLANT
TUBING CIL FLEX 10 FLL-RA (TUBING) ×2 IMPLANT
WIRE HI TORQ VERSACORE-J 145CM (WIRE) ×1 IMPLANT

## 2015-11-12 NOTE — Consult Note (Signed)
CARDIOLOGY CONSULT NOTE   Patient ID: [name] MRN: [id number] DOB/AGE: 61-May-1957 61 y.o.  Admit date: 11/11/2015  Requesting Physician: Dr. Eugenie Filler Primary Physician:   Mauricio Po, [place] Primary Cardiologist:  Dr. Haroldine Laws Dr. Rayann Heman Reason for Consultation:   NSTEMI  HPI: [name] is a 60 y.o. male with a history of HTN, PVC s/p RVOT ablation, chronic systolic HF, CKD stage III, h/o nonobstructive CAD, and borderline DM II who presented to [facility] on 11/11/15 with chest pain. He ruled in for NSTEMI and cardiology consulted.   His last cardiac cath in 2005 showed nonobstructive CAD with 25% D1 stenosis, 25% ramus stenosis. He had a 48-hour Holter monitor on 08/25/2012 that showed 39% monomorphic PVCs, he was referred to Dr. Rayann Heman. He was subsequently taken for PVC mapping followed by potential ablation, however he was not having enough PVCs to map, therefore no ablation was done. He underwent a second PVC ablation by Dr. Rayann Heman on 01/07/2013. He had 24-hour Holter monitor in January 2015 that showed decreased PVC burden. He had a history of nonischemic cardiomyopathy, his EF was 20-25% in July 2014. By February 2015, echocardiogram showed EF 40-45%. He was diagnosed with prostate cancer near the end of 2015 and early 2016 and underwent robotic prostatectomy on 02/24/2014.  He was last seen by Dr. Rayann Heman on 05/11/2015 at which times he was doing well after ablation, therefore no further EP workup was planned. He was admitted on 07/12/2015 for dyspnea and hyperglycemia with blood glucose greater than 700. During the hospitalization, he had mildly elevated troponin of 0.04 which stayed the same on repeat and was instructed to follow-up with cardiology as outpatient. The mildly elevated troponin was completely flat and consistent with demand ischemia. He was discharged on 6/16, however readmitted on 6/22 with dyspnea. He also had acute on chronic renal insufficiency which  was suspected to be related to dehydration. D-dimer was negative. Echo 07/19/2015 shows EF has improved to 55-60%, grade 1 DD.   He was seen in the office in 07/2015 for evaluation of dyspnea. Plan was for exercise myoview, which I don't see was ever completed.   He was seen in the [facility] ER on 11/10/15 with chest pain. He ruled out for MI and was discharged home. He returned yesterday on 11/11/15 with recurrent chest pain felt to be both atypical and typical. ECG with ischemic changes, especially in anterolateral leads. He was admitted for further work up. Initially, plan was for myoview, but he ruled in for MI with peak troponin ~14. He has been hypertensive. Creat 1.35. Plan is for transfer to [facility] with cath today.   He currently is chest pain free and resting comfortably. No SOB. No LE edema, orthopnea or PND. No dizziness or syncope. No blood in stool or urine.    Past Medical History:  Diagnosis Date  . Arthritis    lower back   . Asthma    years ago   . CAD    nonobst cath 2004  . Cancer [facility])    prostate cancer  . CARDIOMYOPATHY    NICM, LVEF 30% 01/2011 echo; 20-25% 07/2012 echo  . CHF (congestive heart failure) ([place])   . DIABETES MELLITUS, TYPE II   . HYPERCHOLESTEROLEMIA   . HYPERTENSION   . Noncompliance   . PSA, INCREASED   . PVCs (premature ventricular contractions)    s/p ablation 01-07-2013 by Dr Rayann Heman     Past Surgical History:  Procedure Laterality  Date  . ABLATION  01-07-2013   RVOT PVC's ablated by Dr Rayann Heman along anteroseptal RVOT  . CARDIAC CATHETERIZATION  2004   nonobst dz  . CHOLECYSTECTOMY N/A 11/16/2013   Procedure: LAPAROSCOPIC CHOLECYSTECTOMY WITH INTRAOPERATIVE CHOLANGIOGRAM;  Surgeon: [name], MD;  [facility];  Service: General;  Laterality: N/A;  . LYMPHADENECTOMY Bilateral 02/22/2014   Procedure: PELVIC LYMPH NODE DISSECTION;  Surgeon: [name], MD;  [facility];  Service: Urology;  Laterality: Bilateral;  . ROBOT ASSISTED  LAPAROSCOPIC RADICAL PROSTATECTOMY N/A 02/22/2014   Procedure: ROBOTIC ASSISTED LAPAROSCOPIC RADICAL PROSTATECTOMY WITH INDOCYANINE GREEN DYE AND OPEN UMBILICAL HERNIA REPAIR;  Surgeon: [name], MD;  [facility];  Service: Urology;  Laterality: N/A;  . SUPRAVENTRICULAR TACHYCARDIA ABLATION N/A 09/28/2012   Procedure: Tanna Furry Ablation;  Surgeon: [name], MD;  Location: [facility] CATH LAB;  Service: Cardiovascular;  Laterality: N/A;  . SURGERY SCROTAL / TESTICULAR     at age 43  . V-TACH ABLATION N/A 01/07/2013   Procedure: V-TACH ABLATION;  Surgeon: Coralyn , MD;  Location: [place] CATH LAB;  Service: Cardiovascular;  Laterality: N/A;    Allergies  Allergen Reactions  . Ambien [Zolpidem Tartrate] Other (See Comments)    Pt states that this medication made him go crazy.   . Metformin And Related Diarrhea    I have reviewed the patient's current medications . aspirin EC  81 mg Oral Daily  . carvedilol  12.5 mg Oral Q breakfast  . hydrALAZINE  50 mg Oral TID  . hydrochlorothiazide  12.5 mg Oral Daily  . Influenza vac split quadrivalent PF  0.5 mL Intramuscular Tomorrow-1000  . insulin aspart  0-9 Units Subcutaneous TID WC  . insulin glargine  15 Units Subcutaneous QHS  . isosorbide mononitrate  60 mg Oral Daily  . lisinopril  10 mg Oral Daily  . pantoprazole  40 mg Oral Daily   . heparin 1,000 Units/hr (11/11/15 2224)   acetaminophen, gi cocktail, hydrALAZINE, ondansetron (ZOFRAN) IV  Prior to Admission medications   Medication Sig Start Date End Date Taking? Authorizing Provider  Aspirin-Acetaminophen-Caffeine (GOODY HEADACHE PO) Take 1 packet by mouth 2 (two) times daily as needed (for pain/headache).    Yes Historical Provider, MD  carvedilol (COREG) 12.5 MG tablet Take 1 tablet (12.5 mg total) by mouth daily. 07/10/15  Yes Lacretia Leigh, MD  hydrALAZINE (APRESOLINE) 50 MG tablet Take 1 tablet (50 mg total) by mouth 3 (three) times daily. 07/10/15  Yes Lacretia Leigh, MD    hydrochlorothiazide (HYDRODIURIL) 12.5 MG tablet Take 1 tablet (12.5 mg total) by mouth daily. 08/02/15  Yes Almyra Deforest, PA  insulin glargine (LANTUS) 100 unit/mL SOPN Inject 20 Units into the skin at bedtime.   Yes Historical Provider, MD  isosorbide mononitrate (IMDUR) 60 MG 24 hr tablet Take 1 tablet (60 mg total) by mouth daily. 07/10/15  Yes Lacretia Leigh, MD  lisinopril (PRINIVIL,ZESTRIL) 10 MG tablet Take 1 tablet (10 mg total) by mouth daily. 08/02/15  Yes Almyra Deforest, PA     Social History   Social History  . Marital status: Single    Spouse name: N/A  . Number of children: N/A  . Years of education: N/A   Occupational History  . 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 Smoking status: Never Smoker  . 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 Alcohol use No  Comment: rare  . Drug use: No  . Sexual activity: No   Other Topics Concern  . Not on file   Social History Narrative   Single - divorced x 3 -   Lives with 2 sons, enjoys spending time with 6 g-kids and 3 kids    Family Status  Relation Status  . Mother Deceased at age 26   due to PVD (AKA)  . Father Deceased  .    [name] Brother   . Paternal Grandfather   . Maternal Grandmother    Family History  Problem Relation Age of Onset  . Hypertension Mother   . Peripheral vascular disease Mother   . Peripheral vascular disease    . Hypertension    . Prostate cancer    . Prostate cancer Brother   . Heart attack Paternal Grandfather   . Heart failure Maternal Grandmother       ROS:  Full 14 point review of systems complete and found to be negative unless listed above.  Physical Exam: Blood pressure (!) 173/95, pulse 91, temperature 98.9 F (37.2 C), temperature source Oral, resp. rate (!) 22, height 5\' 5"  (1.651 m), weight 184 lb (83.5 kg), SpO2 100 %.  General: Well developed, well nourished, male in no acute distress Head:  Eyes PERRLA, No xanthomas.   Normocephalic and atraumatic, oropharynx without edema or exudate.  Lungs: CTAB Heart: HRRR S1 S2, no rub/gallop, Heart regular rate and rhythm with S1, S2  murmur. pulses are 2+ extrem.   Neck: No carotid bruits. No lymphadenopathy.  No JVD. Abdomen: Bowel sounds present, abdomen soft and non-tender without masses or hernias noted. Msk:  No spine or cva tenderness. No weakness, no joint deformities or effusions. Extremities: No clubbing or cyanosis.  No LE edema.  Neuro: Alert and oriented X 3. No focal deficits noted. Psych:  Good affect, responds appropriately Skin: No rashes or lesions noted.  Labs:   Lab Results  Component Value Date   WBC 12.8 (H) 11/12/2015   HGB 13.9 11/12/2015   HCT 39.5 11/12/2015   MCV 85.7 11/12/2015   PLT 269 11/12/2015    Recent Labs  11/11/15 2156  INR 0.91    Recent Labs Lab 11/11/15 1027 11/12/15 0040  NA 136 135  K 4.1 3.8  CL 106 108  CO2 23 22  BUN 18 21*  CREATININE 1.28* 1.35*  CALCIUM 9.2 8.9  PROT 7.8  --   BILITOT 0.8  --   ALKPHOS 70  --   ALT 28  --   AST 30  --   GLUCOSE 194* 173*  ALBUMIN 3.9  --    Magnesium  Date Value Ref Range Status  02/14/2011 2.5 1.5 - 2.5 mg/dL Final    Recent Labs  11/11/15 1830 11/11/15 2111 11/12/15 0040 11/12/15 0631  TROPONINI 11.79* 13.58* 12.48* 7.82*    Recent Labs  11/10/15 0607 11/11/15 1036  TROPIPOC 0.02 0.08   Pro B Natriuretic peptide (BNP)  Date/Time Value Ref Range Status  11/24/2012 11:27 AM 478.9 (H) 0 - 125 pg/mL Final  09/15/2012 09:47 AM 604.9 (H) 0 - 125 pg/mL Final    Lab Results  Component Value Date   DDIMER <0.27 11/11/2015   Lipase  Date/Time Value Ref Range Status  11/11/2015 10:27 AM 25 11 - 51 U/L Final   Amylase  Date/Time Value Ref Range Status  11/16/2013 02:47 AM 118 (H) 0 - 105 U/L Final    No results found for:  VITAMINB12, FOLATE, FERRITIN, TIBC, IRON, RETICCTPCT  Echo: 07/19/2015 LV EF: 55% -    60% Study Conclusion - Left ventricle: The cavity size was mildly dilated. There was   severe concentric hypertrophy. Systolic function was normal. The   estimated ejection fraction was in the range of 55% to 60%. Wall   motion was normal; there were no regional wall motion   abnormalities. Doppler parameters are consistent with abnormal   left ventricular relaxation (grade 1 diastolic dysfunction). - Left atrium: The atrium was mildly dilated.  ECG:  Normal sinus rhythm HR 92 with LAFB, LVH, with repol abnormalities and anterolateral ST depression and TWI   Radiology:  No results found.  ASSESSMENT AND PLAN:    Active Problems:   Diabetes type 2, uncontrolled (HCC)   Coronary atherosclerosis   HTN (hypertension), malignant   Chronic systolic heart failure (HCC)   CKD (chronic kidney disease) stage 2, GFR 60-89 ml/min   OSA (obstructive sleep apnea)   Chest pain  NSTEMI: peak troponin 13.58. Continue IV heparin. Currently chest pain free. Continue ASA and BB. Will add atorvastatin 80mg  daily. Plan will be for cardiac cath today.   I have reviewed the risks, indications, and alternatives to cardiac catheterization and possible angioplasty/stenting with the patient. Risks include but are not limited to bleeding, infection, vascular injury, stroke, myocardial infection, arrhythmia, kidney injury, radiation-related injury in the case of prolonged fluoroscopy use, emergency cardiac surgery, and death. The patient understands the risks of serious complication is low (123456).    NICM/H/o chronic systolic HF with improved EF: EF 20% Nadir 2013, last echo 07/19/2015 EF 55-60%. Appears euvolemic on exam  HTN: BP has been elevated, will increase Coreg from 12.5--> 25mg  BID. HR in 90s  PVC s/p RVOT ablation: followed by EP on an as needed basis  Uncontrolled DM II: Hgb A1C 13.8 on 07/13/2015, he need better management of DM  CKD: creat 1.35 today  Signed: Angelena Form,  PA-C 11/12/2015 7:46 AM  Pager [phone]  Co-Sign MD  Personally seen and examined. Agree with above.  NSTEMI  - plan cath today. Trop 14. Risks and benefits discussed (stroke, MI, bleeding, renal). Willing to proceed.   - Statin high intensity  H/O cardiomyopathy  - EF improved on recent ECHO. 55% Prior 20%  - Coreg  DM  - uncontrolled.  No CP right now. Ischemic ECG, TWI. RRR, CTAB, AAO x 3.   Candee Furbish, MD

## 2015-11-12 NOTE — Interval H&P Note (Signed)
Cath Lab Visit (complete for each Cath Lab visit)  Clinical Evaluation Leading to the Procedure:   ACS: Yes.    Non-ACS:    Anginal Classification: CCS IV  Anti-ischemic medical therapy: Minimal Therapy (1 class of medications)  Non-Invasive Test Results: No non-invasive testing performed  Prior CABG: No previous CABG      History and Physical Interval Note:  11/12/2015 3:10 PM  [name]  has presented today for surgery, with the diagnosis of n stemi  The various methods of treatment have been discussed with the patient and family. After consideration of risks, benefits and other options for treatment, the patient has consented to  Procedure(s): Left Heart Cath and Coronary Angiography (N/A) as a surgical intervention .  The patient's history has been reviewed, patient examined, no change in status, stable for surgery.  I have reviewed the patient's chart and labs.  Questions were answered to the patient's satisfaction.     Sherren Mocha

## 2015-11-12 NOTE — Progress Notes (Signed)
Update:  Pt continues to await transfer to [facility]. There has been high demands on [facility] this evening w/ other pt transports. Multiple calls have been made by RN and myself. Spoke w/ Agricultural consultant w/ [facility] who stated they are picking up a pt now for transport to [facility] from [facility] and will be back to transport [name] after that. Pt remains CP free. VSS. Last trop at 0040 12.48. Repeat trop scheduled for 0630 this am.  Spoke w/ Dr Rayann Heman w/ cardiology service to make him aware of delay in transfer to [facility]. Flow manager's office also updated on transfer delay. Pt to go to 2H-27 upon arrival to [facility]. Will continue to monitor closely on telemetry pending transfer to [facility].   Jeryl Columbia, NP-C [facility] Pager [phone]

## 2015-11-12 NOTE — H&P (View-Only) (Signed)
CARDIOLOGY CONSULT NOTE   Patient ID: DA BEISEL MRN: [id number] DOB/AGE: Jun 30, 1955 60 y.o.  Admit date: 11/11/2015  Requesting Physician: Dr. Eugenie Filler Primary Physician:   Mauricio Po, [place] Primary Cardiologist:  Dr. Haroldine Laws Dr. Rayann Heman Reason for Consultation:   NSTEMI  HPI: JAIDAN FAGOT is a 60 y.o. male with a history of HTN, PVC s/p RVOT ablation, chronic systolic HF, CKD stage III, h/o nonobstructive CAD, and borderline DM II who presented to [facility] on 11/11/15 with chest pain. He ruled in for NSTEMI and cardiology consulted.   His last cardiac cath in 2005 showed nonobstructive CAD with 25% D1 stenosis, 25% ramus stenosis. He had a 48-hour Holter monitor on 08/25/2012 that showed 39% monomorphic PVCs, he was referred to Dr. Rayann Heman. He was subsequently taken for PVC mapping followed by potential ablation, however he was not having enough PVCs to map, therefore no ablation was done. He underwent a second PVC ablation by Dr. Rayann Heman on 01/07/2013. He had 24-hour Holter monitor in January 2015 that showed decreased PVC burden. He had a history of nonischemic cardiomyopathy, his EF was 20-25% in July 2014. By February 2015, echocardiogram showed EF 40-45%. He was diagnosed with prostate cancer near the end of 2015 and early 2016 and underwent robotic prostatectomy on 02/24/2014.  He was last seen by Dr. Rayann Heman on 05/11/2015 at which times he was doing well after ablation, therefore no further EP workup was planned. He was admitted on 07/12/2015 for dyspnea and hyperglycemia with blood glucose greater than 700. During the hospitalization, he had mildly elevated troponin of 0.04 which stayed the same on repeat and was instructed to follow-up with cardiology as outpatient. The mildly elevated troponin was completely flat and consistent with demand ischemia. He was discharged on 6/16, however readmitted on 6/22 with dyspnea. He also had acute on chronic renal insufficiency which  was suspected to be related to dehydration. D-dimer was negative. Echo 07/19/2015 shows EF has improved to 55-60%, grade 1 DD.   He was seen in the office in 07/2015 for evaluation of dyspnea. Plan was for exercise myoview, which I don't see was ever completed.   He was seen in the [facility] ER on 11/10/15 with chest pain. He ruled out for MI and was discharged home. He returned yesterday on 11/11/15 with recurrent chest pain felt to be both atypical and typical. ECG with ischemic changes, especially in anterolateral leads. He was admitted for further work up. Initially, plan was for myoview, but he ruled in for MI with peak troponin ~14. He has been hypertensive. Creat 1.35. Plan is for transfer to [facility] with cath today.   He currently is chest pain free and resting comfortably. No SOB. No LE edema, orthopnea or PND. No dizziness or syncope. No blood in stool or urine.    Past Medical History:  Diagnosis Date  . Arthritis    lower back   . Asthma    years ago   . CAD    nonobst cath 2004  . Cancer [facility])    prostate cancer  . CARDIOMYOPATHY    NICM, LVEF 30% 01/2011 echo; 20-25% 07/2012 echo  . CHF (congestive heart failure) ([place])   . DIABETES MELLITUS, TYPE II   . HYPERCHOLESTEROLEMIA   . HYPERTENSION   . Noncompliance   . PSA, INCREASED   . PVCs (premature ventricular contractions)    s/p ablation 01-07-2013 by Dr Rayann Heman     Past Surgical History:  Procedure Laterality  Date  . ABLATION  01-07-2013   RVOT PVC's ablated by Dr Rayann Heman along anteroseptal RVOT  . CARDIAC CATHETERIZATION  2004   nonobst dz  . CHOLECYSTECTOMY N/A 11/16/2013   Procedure: LAPAROSCOPIC CHOLECYSTECTOMY WITH INTRAOPERATIVE CHOLANGIOGRAM;  Surgeon: [name], MD;  Location: [facility] ORS;  Service: General;  Laterality: N/A;  . LYMPHADENECTOMY Bilateral 02/22/2014   Procedure: PELVIC LYMPH NODE DISSECTION;  Surgeon: [name], MD;  Location: [facility] ORS;  Service: Urology;  Laterality: Bilateral;  . ROBOT ASSISTED  LAPAROSCOPIC RADICAL PROSTATECTOMY N/A 02/22/2014   Procedure: ROBOTIC ASSISTED LAPAROSCOPIC RADICAL PROSTATECTOMY WITH INDOCYANINE GREEN DYE AND OPEN UMBILICAL HERNIA REPAIR;  Surgeon: [name], MD;  Location: [facility] ORS;  Service: Urology;  Laterality: N/A;  . SUPRAVENTRICULAR TACHYCARDIA ABLATION N/A 09/28/2012   Procedure: Tanna Furry Ablation;  Surgeon: [name], MD;  Location: [facility] CATH LAB;  Service: Cardiovascular;  Laterality: N/A;  . SURGERY SCROTAL / TESTICULAR     at age 82  . V-TACH ABLATION N/A 01/07/2013   Procedure: V-TACH ABLATION;  Surgeon: [name], MD;  Location: [place] CATH LAB;  Service: Cardiovascular;  Laterality: N/A;    Allergies  Allergen Reactions  . Ambien [Zolpidem Tartrate] Other (See Comments)    Pt states that this medication made him go crazy.   . Metformin And Related Diarrhea    I have reviewed the patient's current medications . aspirin EC  81 mg Oral Daily  . carvedilol  12.5 mg Oral Q breakfast  . hydrALAZINE  50 mg Oral TID  . hydrochlorothiazide  12.5 mg Oral Daily  . Influenza vac split quadrivalent PF  0.5 mL Intramuscular Tomorrow-1000  . insulin aspart  0-9 Units Subcutaneous TID WC  . insulin glargine  15 Units Subcutaneous QHS  . isosorbide mononitrate  60 mg Oral Daily  . lisinopril  10 mg Oral Daily  . pantoprazole  40 mg Oral Daily   . heparin 1,000 Units/hr (11/11/15 2224)   acetaminophen, gi cocktail, hydrALAZINE, ondansetron (ZOFRAN) IV  Prior to Admission medications   Medication Sig Start Date End Date Taking? Authorizing Provider  Aspirin-Acetaminophen-Caffeine (GOODY HEADACHE PO) Take 1 packet by mouth 2 (two) times daily as needed (for pain/headache).    Yes Historical Provider, MD  carvedilol (COREG) 12.5 MG tablet Take 1 tablet (12.5 mg total) by mouth daily. 07/10/15  Yes Lacretia Leigh, MD  hydrALAZINE (APRESOLINE) 50 MG tablet Take 1 tablet (50 mg total) by mouth 3 (three) times daily. 07/10/15  Yes Lacretia Leigh, MD    hydrochlorothiazide (HYDRODIURIL) 12.5 MG tablet Take 1 tablet (12.5 mg total) by mouth daily. 08/02/15  Yes Almyra Deforest, PA  insulin glargine (LANTUS) 100 unit/mL SOPN Inject 20 Units into the skin at bedtime.   Yes Historical Provider, MD  isosorbide mononitrate (IMDUR) 60 MG 24 hr tablet Take 1 tablet (60 mg total) by mouth daily. 07/10/15  Yes Lacretia Leigh, MD  lisinopril (PRINIVIL,ZESTRIL) 10 MG tablet Take 1 tablet (10 mg total) by mouth daily. 08/02/15  Yes Almyra Deforest, PA     Social History   Social History  . Marital status: Single    Spouse name: N/A  . Number of children: N/A  . Years of education: N/A   Occupational History  . 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 Smoking status: Never Smoker  . 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 Alcohol use No  Comment: rare  . Drug use: No  . Sexual activity: No   Other Topics Concern  . Not on file   Social History Narrative   Single - divorced x 3 -   Lives with 2 sons, enjoys spending time with 6 g-kids and 3 kids    Family Status  Relation Status  . Mother Deceased at age 72   due to PVD (AKA)  . Father Deceased  .    [name] Brother   . Paternal Grandfather   . Maternal Grandmother    Family History  Problem Relation Age of Onset  . Hypertension Mother   . Peripheral vascular disease Mother   . Peripheral vascular disease    . Hypertension    . Prostate cancer    . Prostate cancer Brother   . Heart attack Paternal Grandfather   . Heart failure Maternal Grandmother       ROS:  Full 14 point review of systems complete and found to be negative unless listed above.  Physical Exam: Blood pressure (!) 173/95, pulse 91, temperature 98.9 F (37.2 C), temperature source Oral, resp. rate (!) 22, height 5\' 5"  (1.651 m), weight 184 lb (83.5 kg), SpO2 100 %.  General: Well developed, well nourished, male in no acute distress Head:  Eyes PERRLA, No xanthomas.   Normocephalic and atraumatic, oropharynx without edema or exudate.  Lungs: CTAB Heart: HRRR S1 S2, no rub/gallop, Heart regular rate and rhythm with S1, S2  murmur. pulses are 2+ extrem.   Neck: No carotid bruits. No lymphadenopathy.  No JVD. Abdomen: Bowel sounds present, abdomen soft and non-tender without masses or hernias noted. Msk:  No spine or cva tenderness. No weakness, no joint deformities or effusions. Extremities: No clubbing or cyanosis.  No LE edema.  Neuro: Alert and oriented X 3. No focal deficits noted. Psych:  Good affect, responds appropriately Skin: No rashes or lesions noted.  Labs:   Lab Results  Component Value Date   WBC 12.8 (H) 11/12/2015   HGB 13.9 11/12/2015   HCT 39.5 11/12/2015   MCV 85.7 11/12/2015   PLT 269 11/12/2015    Recent Labs  11/11/15 2156  INR 0.91    Recent Labs Lab 11/11/15 1027 11/12/15 0040  NA 136 135  K 4.1 3.8  CL 106 108  CO2 23 22  BUN 18 21*  CREATININE 1.28* 1.35*  CALCIUM 9.2 8.9  PROT 7.8  --   BILITOT 0.8  --   ALKPHOS 70  --   ALT 28  --   AST 30  --   GLUCOSE 194* 173*  ALBUMIN 3.9  --    Magnesium  Date Value Ref Range Status  02/14/2011 2.5 1.5 - 2.5 mg/dL Final    Recent Labs  11/11/15 1830 11/11/15 2111 11/12/15 0040 11/12/15 0631  TROPONINI 11.79* 13.58* 12.48* 7.82*    Recent Labs  11/10/15 0607 11/11/15 1036  TROPIPOC 0.02 0.08   Pro B Natriuretic peptide (BNP)  Date/Time Value Ref Range Status  11/24/2012 11:27 AM 478.9 (H) 0 - 125 pg/mL Final  09/15/2012 09:47 AM 604.9 (H) 0 - 125 pg/mL Final    Lab Results  Component Value Date   DDIMER <0.27 11/11/2015   Lipase  Date/Time Value Ref Range Status  11/11/2015 10:27 AM 25 11 - 51 U/L Final   Amylase  Date/Time Value Ref Range Status  11/16/2013 02:47 AM 118 (H) 0 - 105 U/L Final    No results found for:  VITAMINB12, FOLATE, FERRITIN, TIBC, IRON, RETICCTPCT  Echo: 07/19/2015 LV EF: 55% -    60% Study Conclusion - Left ventricle: The cavity size was mildly dilated. There was   severe concentric hypertrophy. Systolic function was normal. The   estimated ejection fraction was in the range of 55% to 60%. Wall   motion was normal; there were no regional wall motion   abnormalities. Doppler parameters are consistent with abnormal   left ventricular relaxation (grade 1 diastolic dysfunction). - Left atrium: The atrium was mildly dilated.  ECG:  Normal sinus rhythm HR 92 with LAFB, LVH, with repol abnormalities and anterolateral ST depression and TWI   Radiology:  No results found.  ASSESSMENT AND PLAN:    Active Problems:   Diabetes type 2, uncontrolled (HCC)   Coronary atherosclerosis   HTN (hypertension), malignant   Chronic systolic heart failure (HCC)   CKD (chronic kidney disease) stage 2, GFR 60-89 ml/min   OSA (obstructive sleep apnea)   Chest pain  NSTEMI: peak troponin 13.58. Continue IV heparin. Currently chest pain free. Continue ASA and BB. Will add atorvastatin 80mg  daily. Plan will be for cardiac cath today.   I have reviewed the risks, indications, and alternatives to cardiac catheterization and possible angioplasty/stenting with the patient. Risks include but are not limited to bleeding, infection, vascular injury, stroke, myocardial infection, arrhythmia, kidney injury, radiation-related injury in the case of prolonged fluoroscopy use, emergency cardiac surgery, and death. The patient understands the risks of serious complication is low (123456).    NICM/H/o chronic systolic HF with improved EF: EF 20% Nadir 2013, last echo 07/19/2015 EF 55-60%. Appears euvolemic on exam  HTN: BP has been elevated, will increase Coreg from 12.5--> 25mg  BID. HR in 90s  PVC s/p RVOT ablation: followed by EP on an as needed basis  Uncontrolled DM II: Hgb A1C 13.8 on 07/13/2015, he need better management of DM  CKD: creat 1.35 today  Signed: Angelena Form,  PA-C 11/12/2015 7:46 AM  Pager [phone]  Co-Sign MD  Personally seen and examined. Agree with above.  NSTEMI  - plan cath today. Trop 14. Risks and benefits discussed (stroke, MI, bleeding, renal). Willing to proceed.   - Statin high intensity  H/O cardiomyopathy  - EF improved on recent ECHO. 55% Prior 20%  - Coreg  DM  - uncontrolled.  No CP right now. Ischemic ECG, TWI. RRR, CTAB, AAO x 3.   Candee Furbish, MD

## 2015-11-12 NOTE — Progress Notes (Signed)
GI cocktail given for chest pain. Dr. Burt Knack came and spoke with patient and family about heart catheterization. RN has obtained consent and placed it in patients chart.

## 2015-11-12 NOTE — Progress Notes (Signed)
Gave report to RN on 2H. PA stated that patient will have cardiac cath at sometime today.

## 2015-11-12 NOTE — Progress Notes (Signed)
RN notified Cardiology, Angelena Form PA, of patients arrival.

## 2015-11-12 NOTE — Progress Notes (Signed)
[facility] for Heparin Indication: chest pain/ACS  Allergies  Allergen Reactions  . Ambien [Zolpidem Tartrate] Other (See Comments)    Pt states that this medication made him go crazy.   . Metformin And Related Diarrhea    Patient Measurements: Height: 5\' 5"  (165.1 cm) Weight: 184 lb (83.5 kg) IBW/kg (Calculated) : 61.5 Heparin Dosing Weight:   Vital Signs: Temp: 98.3 F (36.8 C) (10/16 0849) Temp Source: Oral (10/16 0849) BP: 157/97 (10/16 0849) Pulse Rate: 91 (10/16 0635)  Labs:  Recent Labs  11/10/15 0329 11/11/15 1027  11/11/15 2111 11/11/15 2156 11/12/15 0040 11/12/15 0631  HGB 14.0 14.3  --   --   --  13.9  --   HCT 40.8 41.2  --   --   --  39.5  --   PLT 266 287  --   --   --  269  --   APTT  --   --   --   --  29  --   --   LABPROT  --   --   --   --  12.2  --   --   INR  --   --   --   --  0.91  --   --   HEPARINUNFRC  --   --   --   --   --   --  0.58  CREATININE 1.43* 1.28*  --   --   --  1.35*  --   TROPONINI  --   --   < > 13.58*  --  12.48* 7.82*  < > = values in this interval not displayed.  Estimated Creatinine Clearance: 57.1 mL/min (by C-G formula based on SCr of 1.35 mg/dL (H)).   Assessment: 53 YOM transferred from [facility] to [facility] with ACS and positive troponins with plans for cath today.  No oral anticoagulants noted on med rec. Started on IV heparin- first heparin level in range at 0.58 units/mL (drawn a little early). CBC stable, no bleeding ntoed.  Goal of Therapy:  Heparin level 0.3-0.7 units/ml Monitor platelets by anticoagulation protocol: Yes   Plan:  Heparin drip at 1000 units/hr Daily CBC and heparin level Follow up after cath   Shawnelle Spoerl D. Tierria Watson, PharmD, BCPS Clinical Pharmacist Pager: [phone] 11/12/2015 9:51 AM

## 2015-11-13 ENCOUNTER — Telehealth: Payer: Self-pay | Admitting: *Deleted

## 2015-11-13 ENCOUNTER — Observation Stay (HOSPITAL_BASED_OUTPATIENT_CLINIC_OR_DEPARTMENT_OTHER): Payer: BLUE CROSS/BLUE SHIELD

## 2015-11-13 ENCOUNTER — Encounter (HOSPITAL_COMMUNITY): Payer: Self-pay | Admitting: Cardiovascular Disease

## 2015-11-13 DIAGNOSIS — R079 Chest pain, unspecified: Secondary | ICD-10-CM

## 2015-11-13 DIAGNOSIS — I493 Ventricular premature depolarization: Secondary | ICD-10-CM | POA: Diagnosis present

## 2015-11-13 DIAGNOSIS — I214 Non-ST elevation (NSTEMI) myocardial infarction: Secondary | ICD-10-CM | POA: Diagnosis not present

## 2015-11-13 DIAGNOSIS — N182 Chronic kidney disease, stage 2 (mild): Secondary | ICD-10-CM | POA: Diagnosis not present

## 2015-11-13 DIAGNOSIS — I2510 Atherosclerotic heart disease of native coronary artery without angina pectoris: Secondary | ICD-10-CM | POA: Diagnosis present

## 2015-11-13 LAB — GLUCOSE, CAPILLARY
Glucose-Capillary: 124 mg/dL — ABNORMAL HIGH (ref 65–99)
Glucose-Capillary: 139 mg/dL — ABNORMAL HIGH (ref 65–99)

## 2015-11-13 LAB — CBC
HCT: 39.4 % (ref 39.0–52.0)
Hemoglobin: 13.2 g/dL (ref 13.0–17.0)
MCH: 29.7 pg (ref 26.0–34.0)
MCHC: 33.5 g/dL (ref 30.0–36.0)
MCV: 88.5 fL (ref 78.0–100.0)
Platelets: 271 10*3/uL (ref 150–400)
RBC: 4.45 MIL/uL (ref 4.22–5.81)
RDW: 13.1 % (ref 11.5–15.5)
WBC: 13.6 10*3/uL — ABNORMAL HIGH (ref 4.0–10.5)

## 2015-11-13 LAB — ECHOCARDIOGRAM COMPLETE
Height: 65 in
Weight: 2924.18 oz

## 2015-11-13 LAB — HEMOGLOBIN A1C
Hgb A1c MFr Bld: 8.2 % — ABNORMAL HIGH (ref 4.8–5.6)
Mean Plasma Glucose: 189 mg/dL

## 2015-11-13 MED ORDER — HYDRALAZINE HCL 100 MG PO TABS: 100.0000 mg | ORAL_TABLET | Freq: Three times a day (TID) | ORAL | 6 refills | Status: DC

## 2015-11-13 MED ORDER — CARVEDILOL 25 MG PO TABS: 25.0000 mg | ORAL_TABLET | Freq: Every day | ORAL | 3 refills | Status: DC

## 2015-11-13 MED ORDER — CLOPIDOGREL BISULFATE 75 MG PO TABS: 75.0000 mg | ORAL_TABLET | Freq: Every day | ORAL | 3 refills | Status: DC

## 2015-11-13 MED ORDER — HYDRALAZINE HCL 50 MG PO TABS: 100.0000 mg | ORAL_TABLET | Freq: Three times a day (TID) | ORAL | Status: DC

## 2015-11-13 MED ORDER — CARVEDILOL 25 MG PO TABS: 12.5000 mg | ORAL_TABLET | Freq: Every day | ORAL | 3 refills | Status: DC

## 2015-11-13 MED ORDER — ATORVASTATIN CALCIUM 80 MG PO TABS: 80.0000 mg | ORAL_TABLET | Freq: Every day | ORAL | 3 refills | Status: DC

## 2015-11-13 MED ORDER — ASPIRIN 81 MG PO TBEC: 81.0000 mg | DELAYED_RELEASE_TABLET | Freq: Every day | ORAL | Status: AC

## 2015-11-13 MED ORDER — CLOPIDOGREL BISULFATE 75 MG PO TABS
75.0000 mg | ORAL_TABLET | Freq: Once | ORAL | Status: AC
  Administered 2015-11-13: 75 mg via ORAL
  Filled 2015-11-13: qty 1

## 2015-11-13 MED FILL — Nitroglycerin IV Soln 100 MCG/ML in D5W: INTRA_ARTERIAL | Qty: 10 | Status: AC

## 2015-11-13 NOTE — Telephone Encounter (Signed)
-----   Message from Candis Schatz sent at 11/13/2015 11:36 AM EDT ----- Regarding: TOC call needed Needs call for TOC appt. Nstemi  Thanks Trisha

## 2015-11-13 NOTE — Discharge Summary (Signed)
Discharge Summary    Patient ID: [name],  MRN: [id number], DOB/AGE: 09-19-1955 60 y.o.  Admit date: 11/11/2015 Discharge date: 11/13/2015  Primary Care Provider: Mauricio Po Primary Cardiologist: Dr. Haroldine Laws Dr. Allred--> wil have follow up with Dr. Marlou Porch.    Discharge Diagnoses    Principal Problem:   NSTEMI (non-ST elevated myocardial infarction) ([place]) Active Problems:   Diabetes type 2, uncontrolled ([place])   HTN (hypertension), malignant   Chronic systolic heart failure (HCC)   CKD (chronic kidney disease) stage 2, GFR 60-89 ml/min   OSA (obstructive sleep apnea)   PVCs (premature ventricular contractions)   CAD (coronary artery disease)   Allergies Allergies  Allergen Reactions  . Ambien [Zolpidem Tartrate] Other (See Comments)    Pt states that this medication made him go crazy.   . Metformin And Related Diarrhea     History of Present Illness     [name] is a 60 y.o. male with a history of HTN, PVC s/p RVOT ablation, chronic systolic HF, CKD stage III, h/o nonobstructive CAD, and borderline DM II who presented to [facility] on 11/11/15 with chest pain. He ruled in for NSTEMI and cardiology consulted.   His last cardiac cath in 2005 showed nonobstructive CAD with 25% D1 stenosis, 25% ramus stenosis. He had a 48-hour Holter monitor on 08/25/2012 that showed 39% monomorphic PVCs, he was referred to Dr. Rayann Heman. He was subsequently taken for PVC mapping followed by potential ablation, however he was not having enough PVCs to map, therefore no ablation was done. He underwent a second PVC ablation by Dr. Rayann Heman on 01/07/2013. He had 24-hour Holter monitor in January 2015 that showed decreased PVC burden. He had a history of nonischemic cardiomyopathy, his EF was 20-25% in July 2014. By February 2015, echocardiogram showed EF 40-45%. He was diagnosed with prostate cancer near the end of 2015 and early 2016 and underwent robotic prostatectomy on  02/24/2014.  He was last seen by Dr. Rayann Heman on 05/11/2015 at which times he was doing well after ablation, therefore no further EP workup was planned. He was admitted on 07/12/2015 for dyspnea and hyperglycemia with blood glucose greater than 700. During the hospitalization, he had mildly elevated troponin of 0.04 which stayed the same on repeat and was instructed to follow-up with cardiology as outpatient. The mildly elevated troponin was completely flat and consistent with demand ischemia. He was discharged on 6/16, however readmitted on 6/22 with dyspnea. He also had acute on chronic renal insufficiency which was suspected to be related to dehydration. D-dimer was negative. Echo 07/19/2015 shows EF has improved to 55-60%, grade 1 DD.   He was seen in the office in 07/2015 for evaluation of dyspnea. Plan was for exercise myoview, which I don't see was ever completed.   He was seen in the [facility] ER on 11/10/15 with chest pain. He ruled out for MI and was discharged home. He returned on 11/11/15 with recurrent chest pain felt to be both atypical and typical. ECG with ischemic changes, especially in anterolateral leads.He was quite hypertensive. He was admitted for further work up. Initially, plan was for [facility], but he ruled in for MI with peak troponin ~14 and cardiology consulted. He was transferred to [facility], admitted to cardiology service and set up for heart cath.    Hospital Course     Consultants: none  NSTEMI: peak troponin 13.58. He was placed on IV heparin and set up for heart cath on 11/12/15 which  showed no obvious large vessel CAD. EF hyperdynamic ( per Dr. Marlou Porch "spade shape apex-apical hypertrophy"). No PCI or clear reason for NSTEMI ( possibly hypertensive urgency?).  -- Continue ASA, BB, atorvastatin 80mg  daily. Plavix 75mg  daily was started and will be continued one year.  NICM/H/o chronic systolic HF with improved EF: EF 20% Nadir 2013, last echo 07/19/2015 EF 55-60%. Appears euvolemic  on exam. Will update 2D ECHO as an outpatient.  HTN: BP has been elevated, Coreg increased from 12.5--> 25mg  BID and Hydralazine increased to 50mg  --> 100mg  TID.   PVC s/p RVOT ablation: followed by EP on an as needed basis  Uncontrolled DM II: Hgb A1C 13.8 on 07/13/2015, he need better management of DM  CKD: creat has remained stable, 1.28 at discharge.    The patient has had an uncomplicated hospital course and is recovering well. The radial catheter site is stable. He has been seen by Dr. Marlou Porch today and deemed ready for discharge home. All follow-up appointments have been scheduled. Discharge medications are listed below.  _____________  Discharge Vitals Blood pressure (!) 149/97, pulse 100, temperature 98.3 F (36.8 C), temperature source Oral, resp. rate 15, height 5\' 5"  (1.651 m), weight 182 lb 12.2 oz (82.9 kg), SpO2 98 %.  Filed Weights   11/11/15 1613 11/12/15 1612  Weight: 184 lb (83.5 kg) 182 lb 12.2 oz (82.9 kg)    Labs & Radiologic Studies     CBC  Recent Labs  11/12/15 0040 11/13/15 0157  WBC 12.8* 13.6*  HGB 13.9 13.2  HCT 39.5 39.4  MCV 85.7 88.5  PLT 269 99991111   Basic Metabolic Panel  Recent Labs  11/11/15 1027 11/12/15 0040  NA 136 135  K 4.1 3.8  CL 106 108  CO2 23 22  GLUCOSE 194* 173*  BUN 18 21*  CREATININE 1.28* 1.35*  CALCIUM 9.2 8.9   Liver Function Tests  Recent Labs  11/11/15 1027  AST 30  ALT 28  ALKPHOS 70  BILITOT 0.8  PROT 7.8  ALBUMIN 3.9    Recent Labs  11/11/15 1027  LIPASE 25   Cardiac Enzymes  Recent Labs  11/11/15 2111 11/12/15 0040 11/12/15 0631  TROPONINI 13.58* 12.48* 7.82*   BNP Invalid input(s): POCBNP D-Dimer  Recent Labs  11/11/15 1030  DDIMER <0.27   Hemoglobin A1C  Recent Labs  11/11/15 1830  HGBA1C 8.2*   Fasting Lipid Panel  Recent Labs  11/12/15 0631  CHOL 177  HDL 40*  LDLCALC 117*  TRIG 102  CHOLHDL 4.4   Thyroid Function Tests No results for input(s): TSH,  T4TOTAL, T3FREE, THYROIDAB in the last 72 hours.  Invalid input(s): FREET3  Dg Chest 2 View  Result Date: 11/10/2015 CLINICAL DATA:  Intermittent chest pain, onset last night. EXAM: CHEST  2 VIEW COMPARISON:  07/19/2015 FINDINGS: The cardiomediastinal contours are normal. The lungs are clear. Pulmonary vasculature is normal. No consolidation, pleural effusion, or pneumothorax. No acute osseous abnormalities are seen. IMPRESSION: No acute pulmonary process. Electronically Signed   By: Jeb Levering M.D.   On: 11/10/2015 04:14     Diagnostic Studies/Procedures    11/12/15 Left Heart Cath and Coronary Angiography  Conclusion     Mid RCA lesion, 30 %stenosed.  Mid LAD lesion, 25 %stenosed.  1st Diag lesion, 30 %stenosed.  There is hyperdynamic left ventricular systolic function.  The left ventricular ejection fraction is greater than 65% by visual estimate.   1. Mild nonobstructive CAD as outlined, with mild  plaque in the mid-RCA, mid-LAD, and first diagonal branch 2. Vigorous LV systolic function with normal LVEDP  No clear explanation for NSTEMI. Hypertensive emergency is a possibility. There is a tiny branch collateral possibly supplying the distal circumflex distribution from the RCA - it is possible this caused NSTEMI but the vessel is very small and occlusion is not visualized.     _____________  Echo: 07/19/2015 LV EF: 55% - 60% Study Conclusion - Left ventricle: The cavity size was mildly dilated. There was severe concentric hypertrophy. Systolic function was normal. The estimated ejection fraction was in the range of 55% to 60%. Wall motion was normal; there were no regional wall motion abnormalities. Doppler parameters are consistent with abnormal left ventricular relaxation (grade 1 diastolic dysfunction). - Left atrium: The atrium was mildly dilated   Disposition   Pt is being discharged home today in good condition.  Follow-up Plans &  Appointments    Follow-up Information    Angelena Form, PA-C. Go on 11/23/2015.   Specialties:  Cardiology, Radiology Why:  @ 9am, please show up 15 minutes early Contact information: [place] [address] [phone]            Discharge Medications     Medication List    STOP taking these medications   GOODY HEADACHE PO     TAKE these medications   aspirin 81 MG EC tablet Take 1 tablet (81 mg total) by mouth daily. Start taking on:  11/14/2015   atorvastatin 80 MG tablet Commonly known as:  LIPITOR Take 1 tablet (80 mg total) by mouth daily at 6 PM.   carvedilol 25 MG tablet Commonly known as:  COREG Take 1 tablet (25 mg total) by mouth daily. What changed:  medication strength  how much to take   clopidogrel 75 MG tablet Commonly known as:  PLAVIX Take 1 tablet (75 mg total) by mouth daily.   hydrALAZINE 100 MG tablet Commonly known as:  APRESOLINE Take 1 tablet (100 mg total) by mouth 3 (three) times daily. What changed:  medication strength  how much to take   hydrochlorothiazide 12.5 MG tablet Commonly known as:  HYDRODIURIL Take 1 tablet (12.5 mg total) by mouth daily.   insulin glargine 100 unit/mL Sopn Commonly known as:  LANTUS Inject 20 Units into the skin at bedtime.   isosorbide mononitrate 60 MG 24 hr tablet Commonly known as:  IMDUR Take 1 tablet (60 mg total) by mouth daily.   lisinopril 10 MG tablet Commonly known as:  PRINIVIL,ZESTRIL Take 1 tablet (10 mg total) by mouth daily.       Aspirin prescribed at discharge?  YES High Intensity Statin Prescribed? (Lipitor 40-80mg  or Crestor 20-40mg )? YES Beta Blocker Prescribed? YES For EF 45% or less, Was ACEI/ARB Prescribed? YES ADP Receptor Inhibitor Prescribed? (i.e. Plavix etc.-Includes Medically Managed Patients?) YES For EF <45%, Aldosterone Inhibitor Prescribed? NO, EF >45% Was EF assessed during THIS hospitalization? YES Was Cardiac  Rehab II ordered? (Included Medically managed Patients): YES   Outstanding Labs/Studies   None.   Duration of Discharge Encounter   Greater than 30 minutes including physician time.  Signed, Angelena Form PA-C 11/13/2015, 11:37 AM  Personally seen and examined. Agree with above.  Subjective   No CP, no SOB. Feels good. Ready to go.  Inpatient Medications    Scheduled Meds: . aspirin EC  81 mg Oral Daily  . atorvastatin  80 mg Oral q1800  . carvedilol  25 mg Oral Q breakfast  . hydrALAZINE  50 mg Oral TID  . hydrochlorothiazide  12.5 mg Oral Daily  . insulin aspart  0-9 Units Subcutaneous TID WC  . insulin glargine  15 Units Subcutaneous QHS  . isosorbide mononitrate  60 mg Oral Daily  . lisinopril  10 mg Oral Daily  . pantoprazole  40 mg Oral Daily   Continuous Infusions:   PRN Meds: acetaminophen, gi cocktail, hydrALAZINE, ondansetron (ZOFRAN) IV   Vital Signs          Vitals:   11/13/15 0700 11/13/15 0800 11/13/15 0833 11/13/15 0900  BP:  (!) 133/100 (!) 149/97   Pulse: 94 86 90 100  Resp: (!) 22 15 16 15   Temp:   98.3 F (36.8 C)   TempSrc:   Oral   SpO2: 99% 98% 99% 98%  Weight:      Height:        Intake/Output Summary (Last 24 hours) at 11/13/15 1029 Last data filed at 11/13/15 1011  Gross per 24 hour  Intake           519.17 ml  Output             1401 ml  Net          -881.83 ml       Filed Weights   11/11/15 1613 11/12/15 1612  Weight: 184 lb (83.5 kg) 182 lb 12.2 oz (82.9 kg)    Physical Exam    GEN: Well nourished, well developed, in no acute distress.  HEENT: Grossly normal.  Neck: Supple, no JVD, carotid bruits, or masses. Cardiac: RRR, no murmurs, rubs, or gallops. No clubbing, cyanosis, edema.  Radials/DP/PT 2+ and equal bilaterally.  Respiratory:  Respirations regular and unlabored, clear to auscultation bilaterally. GI: Soft, nontender, nondistended, BS + x 4. MS: no deformity or  atrophy. Skin: warm and dry, no rash. Cath site normal Neuro:  Strength and sensation are intact. Psych: AAOx3.  Normal affect.  Labs    CBC  Recent Labs (last 2 labs)    Recent Labs  11/12/15 0040 11/13/15 0157  WBC 12.8* 13.6*  HGB 13.9 13.2  HCT 39.5 39.4  MCV 85.7 88.5  PLT 269 271     Basic Metabolic Panel  Recent Labs (last 2 labs)    Recent Labs  11/11/15 1027 11/12/15 0040  NA 136 135  K 4.1 3.8  CL 106 108  CO2 23 22  GLUCOSE 194* 173*  BUN 18 21*  CREATININE 1.28* 1.35*  CALCIUM 9.2 8.9     Liver Function Tests  Recent Labs (last 2 labs)    Recent Labs  11/11/15 1027  AST 30  ALT 28  ALKPHOS 70  BILITOT 0.8  PROT 7.8  ALBUMIN 3.9      Recent Labs (last 2 labs)    Recent Labs  11/11/15 1027  LIPASE 25     Cardiac Enzymes  Recent Labs (last 2 labs)    Recent Labs  11/11/15 2111 11/12/15 0040 11/12/15 0631  TROPONINI 13.58* 12.48* 7.82*     BNP Recent Labs (last 2 labs)   Invalid input(s): POCBNP   D-Dimer  Recent Labs (last 2 labs)    Recent Labs  11/11/15 1030  DDIMER <0.27     Hemoglobin A1C  Recent Labs (last 2 labs)    Recent Labs  11/11/15 1830  HGBA1C 8.2*     Fasting Lipid Panel  Recent Labs (last 2 labs)  Recent Labs  11/12/15 0631  CHOL 177  HDL 40*  LDLCALC 117*  TRIG 102  CHOLHDL 4.4     Thyroid Function Tests  Recent Labs (last 2 labs)   No results for input(s): TSH, T4TOTAL, T3FREE, THYROIDAB in the last 72 hours.  Invalid input(s): FREET3    Telemetry    No adverse rhythms - Personally Reviewed  ECG    TWI lateral leads, LVH - Personally Reviewed  Radiology    Imaging Results (Last 48 hours)  No results found.    Cardiac Studies   Cath - no obvious large vessel occlusion, hyperdynamic EF  Patient Profile     60 year old with NSTEMI (Trop 13) with no obvious large vessel CAD. EF hyperdynamic (spade shape apex-apical  hypertrophy).  Assessment & Plan    NSTEMI  - no PCI (despite this, does have increased morbidity)  - secondary prevention  - Plavix (one year), ASA, Bb, Statin, Imdur  - Cardiac rehab  - D-Dimer normal.   - If echo unable to get done today, ok with outpatient echo. EF was normal on cath.   History of NICM  - now normal EF  Essential hypertension  - Coreg 25 BID now.   - Increase hydral to 100 TID  DM   - uncontrolled, 14 A1c  PVC s/p RVOT ablation: followed by EP on an as needed basis  CKD: creat 1.35   OK for DC  F/U with PCP for DM, HTN. Cardiology follow up as well. 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   Signed, Candee Furbish, MD

## 2015-11-13 NOTE — Telephone Encounter (Signed)
TCM pt of Dr. Haroldine Laws Dr Rayann Heman -call pt on 10/18. Pt was D/C from [facility] hospital today (10/17) pt has an appointment with Bonney Leitz on 11/23/15 at 9:00 AM.

## 2015-11-13 NOTE — Telephone Encounter (Signed)
Pt has a p/h f/u appt scheduled for 10/27 at 9 am with Angelena Form, PA.

## 2015-11-13 NOTE — Discharge Instructions (Signed)

## 2015-11-13 NOTE — Progress Notes (Addendum)
Patient Name: [name] Date of Encounter: 11/13/2015  Primary Cardiologist: Dr. Haroldine Laws Dr. [facility] Problem List     Active Problems:   Diabetes type 2, uncontrolled ([place])   Coronary atherosclerosis   HTN (hypertension), malignant   Chronic systolic heart failure (HCC)   CKD (chronic kidney disease) stage 2, GFR 60-89 ml/min   OSA (obstructive sleep apnea)   Chest pain     Subjective   No CP, no SOB. Feels good. Ready to go.  Inpatient Medications    Scheduled Meds: . aspirin EC  81 mg Oral Daily  . atorvastatin  80 mg Oral q1800  . carvedilol  25 mg Oral Q breakfast  . hydrALAZINE  50 mg Oral TID  . hydrochlorothiazide  12.5 mg Oral Daily  . insulin aspart  0-9 Units Subcutaneous TID WC  . insulin glargine  15 Units Subcutaneous QHS  . isosorbide mononitrate  60 mg Oral Daily  . lisinopril  10 mg Oral Daily  . pantoprazole  40 mg Oral Daily   Continuous Infusions:   PRN Meds: acetaminophen, gi cocktail, hydrALAZINE, ondansetron (ZOFRAN) IV   Vital Signs    Vitals:   11/13/15 0700 11/13/15 0800 11/13/15 0833 11/13/15 0900  BP:  (!) 133/100 (!) 149/97   Pulse: 94 86 90 100  Resp: (!) 22 15 16 15   Temp:   98.3 F (36.8 C)   TempSrc:   Oral   SpO2: 99% 98% 99% 98%  Weight:      Height:        Intake/Output Summary (Last 24 hours) at 11/13/15 1029 Last data filed at 11/13/15 1011  Gross per 24 hour  Intake           519.17 ml  Output             1401 ml  Net          -881.83 ml   Filed Weights   11/11/15 1613 11/12/15 1612  Weight: 184 lb (83.5 kg) 182 lb 12.2 oz (82.9 kg)    Physical Exam    GEN: Well nourished, well developed, in no acute distress.  HEENT: Grossly normal.  Neck: Supple, no JVD, carotid bruits, or masses. Cardiac: RRR, no murmurs, rubs, or gallops. No clubbing, cyanosis, edema.  Radials/DP/PT 2+ and equal bilaterally.  Respiratory:  Respirations regular and unlabored, clear to auscultation  bilaterally. GI: Soft, nontender, nondistended, BS + x 4. MS: no deformity or atrophy. Skin: warm and dry, no rash. Cath site normal Neuro:  Strength and sensation are intact. Psych: AAOx3.  Normal affect.  Labs    CBC  Recent Labs  11/12/15 0040 11/13/15 0157  WBC 12.8* 13.6*  HGB 13.9 13.2  HCT 39.5 39.4  MCV 85.7 88.5  PLT 269 99991111   Basic Metabolic Panel  Recent Labs  11/11/15 1027 11/12/15 0040  NA 136 135  K 4.1 3.8  CL 106 108  CO2 23 22  GLUCOSE 194* 173*  BUN 18 21*  CREATININE 1.28* 1.35*  CALCIUM 9.2 8.9   Liver Function Tests  Recent Labs  11/11/15 1027  AST 30  ALT 28  ALKPHOS 70  BILITOT 0.8  PROT 7.8  ALBUMIN 3.9    Recent Labs  11/11/15 1027  LIPASE 25   Cardiac Enzymes  Recent Labs  11/11/15 2111 11/12/15 0040 11/12/15 0631  TROPONINI 13.58* 12.48* 7.82*   BNP Invalid input(s): POCBNP D-Dimer  Recent Labs  11/11/15 1030  DDIMER <  0.27   Hemoglobin A1C  Recent Labs  11/11/15 1830  HGBA1C 8.2*   Fasting Lipid Panel  Recent Labs  11/12/15 0631  CHOL 177  HDL 40*  LDLCALC 117*  TRIG 102  CHOLHDL 4.4   Thyroid Function Tests No results for input(s): TSH, T4TOTAL, T3FREE, THYROIDAB in the last 72 hours.  Invalid input(s): FREET3  Telemetry    No adverse rhythms - Personally Reviewed  ECG    TWI lateral leads, LVH - Personally Reviewed  Radiology    No results found.  Cardiac Studies   Cath - no obvious large vessel occlusion, hyperdynamic EF  Patient Profile     60 year old with NSTEMI (Trop 13) with no obvious large vessel CAD. EF hyperdynamic (spade shape apex-apical hypertrophy).  Assessment & Plan    NSTEMI  - no PCI (despite this, does have increased morbidity)  - secondary prevention  - Plavix (one year), ASA, Bb, Statin, Imdur  - Cardiac rehab  - D-Dimer normal.   - If echo unable to get done today, ok with outpatient echo. EF was normal on cath.   History of NICM  - now  normal EF  Essential hypertension  - Coreg 25 BID now.   - Increase hydral to 100 TID  DM   - uncontrolled, 14 A1c  PVC s/p RVOT ablation: followed by EP on an as needed basis  CKD: creat 1.35   OK for DC  F/U with PCP for DM, HTN. Cardiology follow up as well. 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   Signed, Candee Furbish, MD  11/13/2015, 10:29 AM

## 2015-11-13 NOTE — Progress Notes (Signed)
*  PRELIMINARY RESULTS* Echocardiogram 2D Echocardiogram has been performed.  Leavy Cella 11/13/2015, 12:07 PM

## 2015-11-14 ENCOUNTER — Encounter (HOSPITAL_COMMUNITY): Payer: Self-pay | Admitting: Cardiovascular Disease

## 2015-11-14 NOTE — Telephone Encounter (Signed)
Patient contacted regarding discharge from [facility]  on 11/13/15  Patient understands to follow up with provider Miguel Rota on 11/23/15 at 9 AM at [facility]. Patient understands discharge instructions? yes Patient understands medications and regiment? yes Patient understands to bring all medications to this visit? yes

## 2015-11-15 ENCOUNTER — Encounter: Payer: Self-pay | Admitting: Physician Assistant

## 2015-11-21 NOTE — Progress Notes (Signed)
Cardiology Office Note    Date:  11/23/2015   ID:  [name], DOB 15-Apr-1955, MRN [id number]  PCP:  Mauricio Po, FNP  Cardiologist: Dr. Haroldine Laws Dr. Allred--> wil have follow up with Dr. Marlou Porch.   CC: post hospital follow up   History of Present Illness:  [name] is a 60 y.o. male with a history of HTN, PVC s/p RVOT ablation, chronic systolic HF, CKD stage III, h/o nonobstructive CAD, DM II and recent NSTEMI who presents to clinic for follow up.  His last cardiac cath in 2005 showed nonobstructive CAD with 25% D1 stenosis, 25% ramus stenosis. He had a 48-hour Holter monitor on 08/25/2012 that showed 39% monomorphic PVCs, he was referred to Dr. Rayann Heman. He was subsequently taken for PVC mapping followed by potential ablation, however he was not having enough PVCs to map, therefore no ablation was done. He underwent a second PVC ablation by Dr. Rayann Heman on 01/07/2013. He had 24-hour Holter monitor in January 2015 that showed decreased PVC burden. He had a history of nonischemic cardiomyopathy, his EF was 20-25% in July 2014. By February 2015, echocardiogram showed EF 40-45%. He was diagnosed with prostate cancer near the end of 2015 and early 2016 and underwent robotic prostatectomy on 02/24/2014.  He was last seen by Dr. Rayann Heman on 05/11/2015 at which times he was doing well after ablation, therefore no further EP workup was planned. He was admitted on 07/12/2015 for dyspnea and hyperglycemia with blood glucose greater than 700. During the hospitalization, he had mildly elevated troponin of 0.04 which stayed the same on repeat and was instructed to follow-up with cardiology as outpatient. The mildly elevated troponin was completely flat and consistent with demand ischemia. He was discharged on 6/16, however readmitted on 6/22 with dyspnea. He also had acute on chronic renal insufficiency which was suspected to be related to dehydration. D-dimer was negative. Echo 07/19/2015 shows  EF has improved to 55-60%, grade 1 DD.   He was seen in the office in 07/2015 for evaluation of dyspnea. Plan was for exercise myoview, which I don't see was ever completed.   He was seen in the [facility] ER on 11/10/15 with chest pain. He ruled out for MI and was discharged home. He returned on 11/11/15 with recurrent chest pain felt to be both atypical and typical. ECG with ischemic changes, especially in anterolateral leads. He was quite hypertensive. He was admitted for further work up. Initially, plan was for [facility], but he ruled in for MI with peak troponin ~14 and cardiology consulted. He was transferred to [facility], admitted to cardiology service and set up for heart cath. LHC on 11/12/15 showed no obvious large vessel CAD. EF hyperdynamic ( per Dr. Marlou Porch "spade shape apex-apical hypertrophy"). No PCI or clear reason for NSTEMI ( possibly hypertensive urgency?). Discharged on asa and plavix. BP was elevated and Coreg increased from 12.5--> 25mg  BID and Hydralazine increased to 50mg  --> 100mg  TID.   Today he presents to clinic for follow up. He has not had anymore chest pain. He gets mildly SOB when running around. 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 No LE edema, orthopnea or PND. No dizziness or passing out. No formal exercise. The most exertion he gets is walking to and from work.      Past Medical History:  Diagnosis Date  . CAD (coronary artery disease)    a. non obst dz by cath 2004  b. 11/12/15 which showed  no obvious large vessel CAD. No PCI or clear reason for NSTEMI ( possibly hypertensive urgency?).   [name] CARDIOMYOPATHY    a. NICM, LVEF 30% 01/2011 echo; 20-25% 07/2012 echo, EF recovered by echo 06/2015.  [name] DIABETES MELLITUS, TYPE II   . HYPERCHOLESTEROLEMIA   . HYPERTENSION   . Noncompliance   . Prostate cancer [facility])    prostate cancer  . PVCs (premature ventricular contractions)    a. s/p ablation 01-07-2013 by Dr Rayann Heman    Past Surgical History:    Procedure Laterality Date  . ABLATION  01-07-2013   RVOT PVC's ablated by Dr Rayann Heman along anteroseptal RVOT  . CARDIAC CATHETERIZATION  2004   nonobst dz  . CARDIAC CATHETERIZATION N/A 11/12/2015   Procedure: Left Heart Cath and Coronary Angiography;  Surgeon: Sherren Mocha, MD;  Location: [place] CV LAB;  Service: Cardiovascular;  Laterality: N/A;  . CHOLECYSTECTOMY N/A 11/16/2013   Procedure: LAPAROSCOPIC CHOLECYSTECTOMY WITH INTRAOPERATIVE CHOLANGIOGRAM;  Surgeon: [name], MD;  Location: [facility];  Service: General;  Laterality: N/A;  . LYMPHADENECTOMY Bilateral 02/22/2014   Procedure: PELVIC LYMPH NODE DISSECTION;  Surgeon: [name], MD;  Location: [facility];  Service: Urology;  Laterality: Bilateral;  . ROBOT ASSISTED LAPAROSCOPIC RADICAL PROSTATECTOMY N/A 02/22/2014   Procedure: ROBOTIC ASSISTED LAPAROSCOPIC RADICAL PROSTATECTOMY WITH INDOCYANINE GREEN DYE AND OPEN UMBILICAL HERNIA REPAIR;  Surgeon: [name], MD;  Location: [facility];  Service: Urology;  Laterality: N/A;  . SUPRAVENTRICULAR TACHYCARDIA ABLATION N/A 09/28/2012   Procedure: Tanna Furry Ablation;  Surgeon: Elesha Thedford Grayer, MD;  Location: [facility] CATH LAB;  Service: Cardiovascular;  Laterality: N/A;  . SURGERY SCROTAL / TESTICULAR     at age 52  . V-TACH ABLATION N/A 01/07/2013   Procedure: V-TACH ABLATION;  Surgeon: [name], MD;  Location: [place] CATH LAB;  Service: Cardiovascular;  Laterality: N/A;    Current Medications: Outpatient Medications Prior to Visit  Medication Sig Dispense Refill  . aspirin EC 81 MG EC tablet Take 1 tablet (81 mg total) by mouth daily.    [name] atorvastatin (LIPITOR) 80 MG tablet Take 1 tablet (80 mg total) by mouth daily at 6 PM. 90 tablet 3  . carvedilol (COREG) 25 MG tablet Take 1 tablet (25 mg total) by mouth daily. 180 tablet 3  . clopidogrel (PLAVIX) 75 MG tablet Take 1 tablet (75 mg total) by mouth daily. 90 tablet 3  . hydrALAZINE (APRESOLINE) 100 MG tablet Take 1 tablet (100 mg  total) by mouth 3 (three) times daily. 90 tablet 6  . insulin glargine (LANTUS) 100 unit/mL SOPN Inject 20 Units into the skin at bedtime.    . hydrochlorothiazide (HYDRODIURIL) 12.5 MG tablet Take 1 tablet (12.5 mg total) by mouth daily. 30 tablet 5  . isosorbide mononitrate (IMDUR) 60 MG 24 hr tablet Take 1 tablet (60 mg total) by mouth daily. 90 tablet 0  . lisinopril (PRINIVIL,ZESTRIL) 10 MG tablet Take 1 tablet (10 mg total) by mouth daily. 30 tablet 5   No facility-administered medications prior to visit.      Allergies:   Ambien [zolpidem tartrate] and Metformin and related   Social History   Social History  . Marital status: Single    Spouse name: N/A  . Number of children: N/A  . Years of education: N/A   Occupational History  . 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 Smoking status: Never Smoker  . Smokeless tobacco: Never Used     Comment:  works for [employer] serviced - mental handicap adults  . Alcohol use No     Comment: rare  . Drug use: No  . Sexual activity: No   Other Topics Concern  . None   Social History Narrative   Single - divorced x 3 -   Lives with 2 sons, enjoys spending time with 6 g-kids and 3 kids     Family History:  The patient's family history includes Heart attack in his paternal grandfather; Heart failure in his maternal grandmother; Hypertension in his mother; Peripheral vascular disease in his mother; Prostate cancer in his brother.     ROS:   Please see the history of present illness.    ROS All other systems reviewed and are negative.   PHYSICAL EXAM:   VS:  BP (!) 184/100   Pulse 70   Ht 5\' 5"  (1.651 m)   Wt 184 lb 6.4 oz (83.6 kg)   BMI 30.69 kg/m    GEN: Well nourished, well developed, in no acute distress  HEENT: normal  Neck: no JVD, carotid bruits, or masses Cardiac: RRR; no murmurs, rubs, or gallops,no edema  Respiratory:  clear to auscultation bilaterally, normal work of breathing GI:  soft, nontender, nondistended, + BS MS: no deformity or atrophy  Skin: warm and dry, no rash Neuro:  Alert and Oriented x 3, Strength and sensation are intact Psych: euthymic mood, full affect  Wt Readings from Last 3 Encounters:  11/23/15 184 lb 6.4 oz (83.6 kg)  11/12/15 182 lb 12.2 oz (82.9 kg)  11/10/15 185 lb (83.9 kg)      Studies/Labs Reviewed:   EKG:  EKG is NOT ordered today.   Recent Labs: 07/19/2015: B Natriuretic Peptide 11.6 11/11/2015: ALT 28 11/12/2015: BUN 21; Creatinine, Ser 1.35; Potassium 3.8; Sodium 135 11/13/2015: Hemoglobin 13.2; Platelets 271   Lipid Panel    Component Value Date/Time   CHOL 177 11/12/2015 0631   TRIG 102 11/12/2015 0631   TRIG 200 09/20/2007   HDL 40 (L) 11/12/2015 0631   CHOLHDL 4.4 11/12/2015 0631   VLDL 20 11/12/2015 0631   LDLCALC 117 (H) 11/12/2015 0631   LDLDIRECT 148.8 04/25/2009 0000    Additional studies/ records that were reviewed today include:  11/12/15 Left Heart Cath and Coronary Angiography  Conclusion     Mid RCA lesion, 30 %stenosed.  Mid LAD lesion, 25 %stenosed.  1st Diag lesion, 30 %stenosed.  There is hyperdynamic left ventricular systolic function.  The left ventricular ejection fraction is greater than 65% by visual estimate.  1. Mild nonobstructive CAD as outlined, with mild plaque in the mid-RCA, mid-LAD, and first diagonal branch 2. Vigorous LV systolic function with normal LVEDP  No clear explanation for NSTEMI. Hypertensive emergency is a possibility. There is a tiny branch collateral possibly supplying the distal circumflex distribution from the RCA - it is possible this caused NSTEMI but the vessel is very small and occlusion is not visualized.     _____________  Echo: 07/19/2015 LV EF: 55% - 60% Study Conclusion - Left ventricle: The cavity size was mildly dilated. There was severe concentric hypertrophy. Systolic function was normal. The estimated ejection fraction  was in the range of 55% to 60%. Wall motion was normal; there were no regional wall motion abnormalities. Doppler parameters are consistent with abnormal left ventricular relaxation (grade 1 diastolic dysfunction). - Left atrium: The atrium was mildly dilated     ASSESSMENT & PLAN:   NSTEMI: peak troponin 13.58. Heart  cath on 11/12/15 showed no obvious large vessel CAD. EF hyperdynamic ( per Dr. Marlou Porch "spade shape apex-apical hypertrophy"). No PCI or clear reason for NSTEMI ( possibly hypertensive urgency?).  -- Continue ASA, BB, atorvastatin 80mg  daily. Plavix 75mg  daily was started and will be continued one year.  NICM/H/o chronic systolic HF with improved EF: EF 20% Nadir 2013, last echo 07/19/2015 EF 55-60%. Appears euvolemic on exam. Will update 2D ECHO now.  HTN: BP quite elevated today at 184/100. Currently Coreg 25mg  BID, hyrdalazine 100mg  TID, imdur 60mg  daily and lisinopril 10mg  daily. Will increase lisinopril to 40mg  daily. Will check a BMET in 1.5 weeks and bring him back to the HTN clinic.   PVC s/p RVOT ablation: followed by EP on an as needed basis  Uncontrolled DM II: Hgb A1C 13.8 on 07/13/2015, he need better management of DM. Will refer back to PCP. He didn't tolerate Metformin   CKD: creat has remained stable, 1.28 at discharge.    Medication Adjustments/Labs and Tests Ordered: Current medicines are reviewed at length with the patient today.  Concerns regarding medicines are outlined above.  Medication changes, Labs and Tests ordered today are listed in the Patient Instructions below. Patient Instructions  Medication Instructions:  Your physician has recommended you make the following change in your medication:  1.  INCREASE the Lisinopril to 40 mg taking 1 tablet daily  Labwork: 1-2 WEEKS:  BMET  Testing/Procedures: Your physician has requested that you have an echocardiogram. Echocardiography is a painless test that uses sound waves to create images  of your heart. It provides your doctor with information about the size and shape of your heart and how well your heart's chambers and valves are working. This procedure takes approximately one hour. There are no restrictions for this procedure.   Follow-Up: Your physician recommends that you schedule a follow-up appointment in: 1-2 WEEKS (SAME DAY AS LABS) TO SEE PHARM D FOR BP RECHECK  Your physician wants you to follow-up in: 4 MONTHS WITH DR. Marlou Porch  You will receive a reminder letter in the mail two months in advance. If you don't receive a letter, please call our office to schedule the follow-up appointment.   Any Other Special Instructions Will Be Listed Below (If Applicable).  Echocardiogram An echocardiogram, or echocardiography, uses sound waves (ultrasound) to produce an image of your heart. The echocardiogram is simple, painless, obtained within a short period of time, and offers valuable information to your health care provider. The images from an echocardiogram can provide information such as:  Evidence of coronary artery disease (CAD).  Heart size.  Heart muscle function.  Heart valve function.  Aneurysm detection.  Evidence of a past heart attack.  Fluid buildup around the heart.  Heart muscle thickening.  Assess heart valve function. LET [facility] CARE PROVIDER KNOW ABOUT:  Any allergies you have.  All medicines you are taking, including vitamins, herbs, eye drops, creams, and over-the-counter medicines.  Previous problems you or members of your family have had with the use of anesthetics.  Any blood disorders you have.  Previous surgeries you have had.  Medical conditions you have.  Possibility of pregnancy, if this applies. BEFORE THE PROCEDURE  No special preparation is needed. Eat and drink normally.  PROCEDURE   In order to produce an image of your heart, gel will be applied to your chest and a wand-like tool (transducer) will be moved over  your chest. The gel will help transmit the sound waves from the  transducer. The sound waves will harmlessly bounce off your heart to allow the heart images to be captured in real-time motion. These images will then be recorded.  You may need an IV to receive a medicine that improves the quality of the pictures. AFTER THE PROCEDURE You may return to your normal schedule including diet, activities, and medicines, unless your health care provider tells you otherwise.   This information is not intended to replace advice given to you by your health care provider. Make sure you discuss any questions you have with your health care provider.   Document Released: 01/11/2000 Document Revised: 02/03/2014 Document Reviewed: 09/20/2012 Elsevier Interactive Patient Education [employer].    If you need a refill on your cardiac medications before your next appointment, please call your pharmacy.      Signed, Angelena Form, PA-C  11/23/2015 9:36 AM    [facility] [place], [place], [place]  [address] Phone: [phone]; Fax: [phone]

## 2015-11-23 ENCOUNTER — Ambulatory Visit (INDEPENDENT_AMBULATORY_CARE_PROVIDER_SITE_OTHER): Payer: BLUE CROSS/BLUE SHIELD | Admitting: Physician Assistant

## 2015-11-23 ENCOUNTER — Encounter: Payer: Self-pay | Admitting: Physician Assistant

## 2015-11-23 VITALS — BP 184/100 | HR 70 | Ht 65.0 in | Wt 184.4 lb

## 2015-11-23 DIAGNOSIS — I428 Other cardiomyopathies: Secondary | ICD-10-CM

## 2015-11-23 DIAGNOSIS — I214 Non-ST elevation (NSTEMI) myocardial infarction: Secondary | ICD-10-CM

## 2015-11-23 DIAGNOSIS — E118 Type 2 diabetes mellitus with unspecified complications: Secondary | ICD-10-CM

## 2015-11-23 DIAGNOSIS — I493 Ventricular premature depolarization: Secondary | ICD-10-CM

## 2015-11-23 DIAGNOSIS — I10 Essential (primary) hypertension: Secondary | ICD-10-CM

## 2015-11-23 MED ORDER — LISINOPRIL 40 MG PO TABS: 40.0000 mg | ORAL_TABLET | Freq: Every day | ORAL | 1 refills | Status: DC

## 2015-11-23 MED ORDER — ISOSORBIDE MONONITRATE ER 60 MG PO TB24: 60.0000 mg | ORAL_TABLET | Freq: Every day | ORAL | 3 refills | Status: DC

## 2015-11-23 MED ORDER — HYDROCHLOROTHIAZIDE 12.5 MG PO TABS: 12.5000 mg | ORAL_TABLET | Freq: Every day | ORAL | 3 refills | Status: DC

## 2015-11-23 NOTE — Patient Instructions (Signed)
Medication Instructions:  Your physician has recommended you make the following change in your medication:  1.  INCREASE the Lisinopril to 40 mg taking 1 tablet daily  Labwork: 1-2 WEEKS:  BMET  Testing/Procedures: Your physician has requested that you have an echocardiogram. Echocardiography is a painless test that uses sound waves to create images of your heart. It provides your doctor with information about the size and shape of your heart and how well your heart's chambers and valves are working. This procedure takes approximately one hour. There are no restrictions for this procedure.   Follow-Up: Your physician recommends that you schedule a follow-up appointment in: 1-2 WEEKS (SAME DAY AS LABS) TO SEE PHARM D FOR BP RECHECK  Your physician wants you to follow-up in: 4 MONTHS WITH DR. Marlou Porch  You will receive a reminder letter in the mail two months in advance. If you don't receive a letter, please call our office to schedule the follow-up appointment.   Any Other Special Instructions Will Be Listed Below (If Applicable).  Echocardiogram An echocardiogram, or echocardiography, uses sound waves (ultrasound) to produce an image of your heart. The echocardiogram is simple, painless, obtained within a short period of time, and offers valuable information to your health care provider. The images from an echocardiogram can provide information such as:  Evidence of coronary artery disease (CAD).  Heart size.  Heart muscle function.  Heart valve function.  Aneurysm detection.  Evidence of a past heart attack.  Fluid buildup around the heart.  Heart muscle thickening.  Assess heart valve function. LET [employer] CARE PROVIDER KNOW ABOUT:  Any allergies you have.  All medicines you are taking, including vitamins, herbs, eye drops, creams, and over-the-counter medicines.  Previous problems you or members of your family have had with the use of anesthetics.  Any blood  disorders you have.  Previous surgeries you have had.  Medical conditions you have.  Possibility of pregnancy, if this applies. BEFORE THE PROCEDURE  No special preparation is needed. Eat and drink normally.  PROCEDURE   In order to produce an image of your heart, gel will be applied to your chest and a wand-like tool (transducer) will be moved over your chest. The gel will help transmit the sound waves from the transducer. The sound waves will harmlessly bounce off your heart to allow the heart images to be captured in real-time motion. These images will then be recorded.  You may need an IV to receive a medicine that improves the quality of the pictures. AFTER THE PROCEDURE You may return to your normal schedule including diet, activities, and medicines, unless your health care provider tells you otherwise.   This information is not intended to replace advice given to you by your health care provider. Make sure you discuss any questions you have with your health care provider.   Document Released: 01/11/2000 Document Revised: 02/03/2014 Document Reviewed: 09/20/2012 Elsevier Interactive Patient Education [employer].    If you need a refill on your cardiac medications before your next appointment, please call your pharmacy.

## 2015-12-03 ENCOUNTER — Other Ambulatory Visit (HOSPITAL_COMMUNITY): Payer: BLUE CROSS/BLUE SHIELD

## 2015-12-03 ENCOUNTER — Other Ambulatory Visit: Payer: BLUE CROSS/BLUE SHIELD

## 2015-12-03 ENCOUNTER — Ambulatory Visit: Payer: BLUE CROSS/BLUE SHIELD

## 2015-12-11 ENCOUNTER — Ambulatory Visit (INDEPENDENT_AMBULATORY_CARE_PROVIDER_SITE_OTHER): Payer: BLUE CROSS/BLUE SHIELD | Admitting: Pharmacist

## 2015-12-11 ENCOUNTER — Encounter: Payer: Self-pay | Admitting: Pharmacist

## 2015-12-11 ENCOUNTER — Other Ambulatory Visit (HOSPITAL_COMMUNITY): Payer: BLUE CROSS/BLUE SHIELD

## 2015-12-11 ENCOUNTER — Other Ambulatory Visit: Payer: BLUE CROSS/BLUE SHIELD | Admitting: *Deleted

## 2015-12-11 VITALS — BP 134/96 | HR 80 | Wt 183.5 lb

## 2015-12-11 DIAGNOSIS — I10 Essential (primary) hypertension: Secondary | ICD-10-CM | POA: Diagnosis not present

## 2015-12-11 DIAGNOSIS — I428 Other cardiomyopathies: Secondary | ICD-10-CM

## 2015-12-11 LAB — BASIC METABOLIC PANEL
BUN: 21 mg/dL (ref 7–25)
CO2: 26 mmol/L (ref 20–31)
Calcium: 9.6 mg/dL (ref 8.6–10.3)
Chloride: 104 mmol/L (ref 98–110)
Creat: 1.42 mg/dL — ABNORMAL HIGH (ref 0.70–1.25)
Glucose, Bld: 124 mg/dL — ABNORMAL HIGH (ref 65–99)
Potassium: 3.9 mmol/L (ref 3.5–5.3)
Sodium: 138 mmol/L (ref 135–146)

## 2015-12-11 MED ORDER — CARVEDILOL 12.5 MG PO TABS: 12.5000 mg | ORAL_TABLET | Freq: Two times a day (BID) | ORAL | 1 refills | Status: DC

## 2015-12-11 NOTE — Progress Notes (Signed)
Patient ID: [name]                 DOB: July 17, 1955                      MRN: [id number]     HPI: [name] is a 60 y.o. male patient of Dr. Celine Ahr with PMH below who presents today for hypertension evaluation. He was recently seen by Angelena Form, PA. His pressure was elevated at this visit and his lisinopril was increased to 40mg  daily from 10mg .   Today the patient presents with a bag containing his medications. He reports he takes them exactly as stated on his bottle. He does not have a bottle with hydralazine and states he had been wondering if he was supposed to be taking because he recalled being on this in the past. He reports he has been off this medication for quite some time. The bottle of carvedilol has directions for 12.5mg  once daily.   He reports that since his visit he has only missed his medications once last night ( the lantus and atorvastatin). Otherwise he is adamant he is compliant.   He reports that he has been having headaches recently. He reports that he has only checked his pressure once since his visit with Angelena Form, PA. He states he does not recall the number but the cuff registered it in the normal range.    Cardiac Hx: HTN, cardiomyopathy, PVC, CHF, hx of NSTEMI, CAD, OSA, DM2, CKD  Current HTN meds:  Lisinopril 40mg  daily - taking as listed Isosorbide mononitrate 60mg  daily - taking as listed Hydrochlorothiazide 12.5mg  daily - taking as listed Hydralazine 100mg  TID - not taking at all Carvedilol 25mg  daily - taking 12.5mg  daily   BP goal: <140/90  Family History: His mother had hypertension. He also reports that his daughter has hypertension.   Social History: Denies tobacco. He reports rare alcohol consumption with 1 drink about every other week.   Diet: He eats about half his meals from home. He believes his daughter cooks with salt/seasonings, but he never adds salt to his food. He does drink diet pepsi and coke about  1-2 bottles per day and 2 cups of coffee most days.   Exercise: He does not exercise. He reports a mostly sedentary lifestyle. He states he is not really interested in making changes as far as exercise goes.   Home BP readings:  Has cuff but has not been monitoring.   Wt Readings from Last 3 Encounters:  11/23/15 184 lb 6.4 oz (83.6 kg)  11/12/15 182 lb 12.2 oz (82.9 kg)  11/10/15 185 lb (83.9 kg)   BP Readings from Last 3 Encounters:  12/11/15 (!) 134/96  11/23/15 (!) 184/100  11/13/15 132/88   Pulse Readings from Last 3 Encounters:  12/11/15 80  11/23/15 70  11/13/15 (!) 108    Renal function: CrCl cannot be calculated (Patient's most recent lab result is older than the maximum 21 days allowed.).  Past Medical History:  Diagnosis Date  . CAD (coronary artery disease)    a. non obst dz by cath 2004  b. 11/12/15 which showed no obvious large vessel CAD. No PCI or clear reason for NSTEMI ( possibly hypertensive urgency?).   [name] CARDIOMYOPATHY    a. NICM, LVEF 30% 01/2011 echo; 20-25% 07/2012 echo, EF recovered by echo 06/2015.  [name] DIABETES MELLITUS, TYPE II   . HYPERCHOLESTEROLEMIA   . HYPERTENSION   .  Noncompliance   . Prostate cancer [facility])    prostate cancer  . PVCs (premature ventricular contractions)    a. s/p ablation 01-07-2013 by Dr Rayann Heman    Current Outpatient Prescriptions on File Prior to Visit  Medication Sig Dispense Refill  . aspirin EC 81 MG EC tablet Take 1 tablet (81 mg total) by mouth daily.    [name] atorvastatin (LIPITOR) 80 MG tablet Take 1 tablet (80 mg total) by mouth daily at 6 PM. 90 tablet 3  . clopidogrel (PLAVIX) 75 MG tablet Take 1 tablet (75 mg total) by mouth daily. 90 tablet 3  . hydrochlorothiazide (HYDRODIURIL) 12.5 MG tablet Take 1 tablet (12.5 mg total) by mouth daily. 90 tablet 3  . insulin glargine (LANTUS) 100 unit/mL SOPN Inject 20 Units into the skin at bedtime.    . isosorbide mononitrate (IMDUR) 60 MG 24 hr tablet Take 1 tablet (60 mg  total) by mouth daily. 90 tablet 3  . lisinopril (PRINIVIL,ZESTRIL) 40 MG tablet Take 1 tablet (40 mg total) by mouth daily. 90 tablet 1   No current facility-administered medications on file prior to visit.     Allergies  Allergen Reactions  . Ambien [Zolpidem Tartrate] Other (See Comments)    Pt states that this medication made him go crazy.   . Metformin And Related Diarrhea    Blood pressure (!) 134/96, pulse 80.   Assessment/Plan: Hypertension: BMET drawn today after increase dose of lisinopril. BP not at goal. Will change dose of carvedilol to 12.5mg  BID (instead of once daily) - the instructions on bottle have been updated. Will have him keep log of pressures and follow up in 2 weeks for further medication titration.      Thank you, Lelan Pons. Patterson Hammersmith, [facility]  12/11/2015 4:00 PM

## 2015-12-11 NOTE — Patient Instructions (Addendum)
Return for a a follow up appointment in 3 weeks  Check your blood pressure at home daily (if able) and keep record of the readings.  Take your BP meds as follows: CONTINUE lisinopril 40mg  daily, Hydrochlorothiazide 12.5mg  daily  START TAKING carvedilol 12.5mg  TWICE daily  Continue all other medications as prescribed.    Bring all of your meds, your BP cuff and your record of home blood pressures to your next appointment.  Exercise as you're able, try to walk approximately 30 minutes per day.  Keep salt intake to a minimum, especially watch canned and prepared boxed foods.  Eat more fresh fruits and vegetables and fewer canned items.  Avoid eating in fast food restaurants.    HOW TO TAKE YOUR BLOOD PRESSURE: . Rest 5 minutes before taking your blood pressure. .  Don't smoke or drink caffeinated beverages for at least 30 minutes before. . Take your blood pressure before (not after) you eat. . Sit comfortably with your back supported and both feet on the floor (don't cross your legs). . Elevate your arm to heart level on a table or a desk. . Use the proper sized cuff. It should fit smoothly and snugly around your bare upper arm. There should be enough room to slip a fingertip under the cuff. The bottom edge of the cuff should be 1 inch above the crease of the elbow. . Ideally, take 3 measurements at one sitting and record the average.

## 2015-12-25 ENCOUNTER — Ambulatory Visit: Payer: BLUE CROSS/BLUE SHIELD | Admitting: Pharmacist

## 2015-12-25 NOTE — Progress Notes (Deleted)
Patient ID: [name]                 DOB: 1955/07/10                      MRN: [id number]     HPI: [name] is a 60 y.o. male patient of Dr. Celine Ahr with PMH below who presents today for hypertension follow up. At his last visit with me it was found that he had been taking his carvedilol 12.5mg  only once per day. He also reported not taking hydralazine for some time before this last visit.    Cardiac Hx: HTN, cardiomyopathy, PVC, CHF, hx of NSTEMI, CAD, OSA, DM2, CKD  Current HTN meds:  Lisinopril 40mg  daily Hydrochlorothiazide 12.5mg  daily Imdur 60mg  daily Carvedilol 12.5mg  BID  Previously tried:  Hydralazine - stopped for unknown reason - pt does not recall a bad reaction or side effects. This will be restarted today if appropriate.   BP goal: <130/80   Family History: His mother had hypertension. He also reports that his daughter has hypertension.   Social History: Denies tobacco. He reports rare alcohol consumption with 1 drink about every other week.   Diet: He eats about half his meals from home. He believes his daughter cooks with salt/seasonings, but he never adds salt to his food. He does drink diet pepsi and coke about 1-2 bottles per day and 2 cups of coffee most days.   Exercise: He does not exercise. He reports a mostly sedentary lifestyle. He states he is not really interested in making changes as far as exercise goes.   Home BP readings:   Wt Readings from Last 3 Encounters:  12/11/15 183 lb 8 oz (83.2 kg)  11/23/15 184 lb 6.4 oz (83.6 kg)  11/12/15 182 lb 12.2 oz (82.9 kg)   BP Readings from Last 3 Encounters:  12/11/15 (!) 134/96  11/23/15 (!) 184/100  11/13/15 132/88   Pulse Readings from Last 3 Encounters:  12/11/15 80  11/23/15 70  11/13/15 (!) 108    Renal function: Estimated Creatinine Clearance: 54.2 mL/min (by C-G formula based on SCr of 1.42 mg/dL (H)).  Past Medical History:  Diagnosis Date  . CAD (coronary  artery disease)    a. non obst dz by cath 2004  b. 11/12/15 which showed no obvious large vessel CAD. No PCI or clear reason for NSTEMI ( possibly hypertensive urgency?).   [name] CARDIOMYOPATHY    a. NICM, LVEF 30% 01/2011 echo; 20-25% 07/2012 echo, EF recovered by echo 06/2015.  [name] DIABETES MELLITUS, TYPE II   . HYPERCHOLESTEROLEMIA   . HYPERTENSION   . Noncompliance   . Prostate cancer [facility])    prostate cancer  . PVCs (premature ventricular contractions)    a. s/p ablation 01-07-2013 by Dr Rayann Heman    Current Outpatient Prescriptions on File Prior to Visit  Medication Sig Dispense Refill  . aspirin EC 81 MG EC tablet Take 1 tablet (81 mg total) by mouth daily.    [name] atorvastatin (LIPITOR) 80 MG tablet Take 1 tablet (80 mg total) by mouth daily at 6 PM. 90 tablet 3  . carvedilol (COREG) 12.5 MG tablet Take 1 tablet (12.5 mg total) by mouth 2 (two) times daily with a meal. 180 tablet 1  . clopidogrel (PLAVIX) 75 MG tablet Take 1 tablet (75 mg total) by mouth daily. 90 tablet 3  . hydrochlorothiazide (HYDRODIURIL) 12.5 MG tablet Take 1 tablet (12.5 mg total) by  mouth daily. 90 tablet 3  . insulin glargine (LANTUS) 100 unit/mL SOPN Inject 20 Units into the skin at bedtime.    . isosorbide mononitrate (IMDUR) 60 MG 24 hr tablet Take 1 tablet (60 mg total) by mouth daily. 90 tablet 3  . lisinopril (PRINIVIL,ZESTRIL) 40 MG tablet Take 1 tablet (40 mg total) by mouth daily. 90 tablet 1   No current facility-administered medications on file prior to visit.     Allergies  Allergen Reactions  . Ambien [Zolpidem Tartrate] Other (See Comments)    Pt states that this medication made him go crazy.   . Metformin And Related Diarrhea    There were no vitals taken for this visit.   Assessment/Plan: Hypertension:    Thank you, Lelan Pons. Patterson Hammersmith, [facility]  12/25/2015 8:04 AM

## 2016-01-25 ENCOUNTER — Emergency Department (HOSPITAL_COMMUNITY): Payer: BLUE CROSS/BLUE SHIELD

## 2016-01-25 ENCOUNTER — Emergency Department (HOSPITAL_COMMUNITY)
Admission: EM | Admit: 2016-01-25 | Discharge: 2016-01-26 | Disposition: A | Discharge status: Home / Self Care | Payer: BLUE CROSS/BLUE SHIELD | Attending: Emergency Medicine | Admitting: Emergency Medicine

## 2016-01-25 ENCOUNTER — Encounter (HOSPITAL_COMMUNITY): Payer: Self-pay | Admitting: Emergency Medicine

## 2016-01-25 DIAGNOSIS — Z7982 Long term (current) use of aspirin: Secondary | ICD-10-CM | POA: Insufficient documentation

## 2016-01-25 DIAGNOSIS — Z794 Long term (current) use of insulin: Secondary | ICD-10-CM | POA: Insufficient documentation

## 2016-01-25 DIAGNOSIS — I252 Old myocardial infarction: Secondary | ICD-10-CM | POA: Diagnosis not present

## 2016-01-25 DIAGNOSIS — R791 Abnormal coagulation profile: Secondary | ICD-10-CM | POA: Diagnosis not present

## 2016-01-25 DIAGNOSIS — Z7902 Long term (current) use of antithrombotics/antiplatelets: Secondary | ICD-10-CM | POA: Insufficient documentation

## 2016-01-25 DIAGNOSIS — N183 Chronic kidney disease, stage 3 (moderate): Secondary | ICD-10-CM | POA: Insufficient documentation

## 2016-01-25 DIAGNOSIS — R079 Chest pain, unspecified: Secondary | ICD-10-CM

## 2016-01-25 DIAGNOSIS — I5022 Chronic systolic (congestive) heart failure: Secondary | ICD-10-CM | POA: Diagnosis not present

## 2016-01-25 DIAGNOSIS — I130 Hypertensive heart and chronic kidney disease with heart failure and stage 1 through stage 4 chronic kidney disease, or unspecified chronic kidney disease: Secondary | ICD-10-CM | POA: Diagnosis not present

## 2016-01-25 DIAGNOSIS — I2510 Atherosclerotic heart disease of native coronary artery without angina pectoris: Secondary | ICD-10-CM | POA: Insufficient documentation

## 2016-01-25 DIAGNOSIS — E1122 Type 2 diabetes mellitus with diabetic chronic kidney disease: Secondary | ICD-10-CM | POA: Insufficient documentation

## 2016-01-25 DIAGNOSIS — Z8546 Personal history of malignant neoplasm of prostate: Secondary | ICD-10-CM | POA: Diagnosis not present

## 2016-01-25 HISTORY — DX: Acute myocardial infarction, unspecified: I21.9

## 2016-01-25 LAB — CBG MONITORING, ED: Glucose-Capillary: 478 mg/dL — ABNORMAL HIGH (ref 65–99)

## 2016-01-25 NOTE — ED Triage Notes (Signed)
BIB EMS from home. Pt had episode of CP 1500 and pain subsided. Then had another episode that started 2200. Substernal with radiation to neck. Denies SOB,N/V. Took 324 ASA, 2NTG. Currently CP free. Pt hypertensive en route (200/120) also hyperglycemic. Sugar reading >600.

## 2016-01-25 NOTE — ED Provider Notes (Signed)
[facility] DEPT Provider Note   CSN: [id number] Arrival date & time: 01/25/16  2305     History   Chief Complaint Chief Complaint  Patient presents with  . Chest Pain  . Hyperglycemia    HPI [name] is a 60 y.o. male.  HPI 60 y.o.malewith a history of HTN, PVC s/p ablation, chronic systolic HF, CKD stage III, h/o nonobstructive CAD, and DM II who presents to the ER with cc of chest pain. Pt had 2 unprovoked episodes of chest pain today, one at 2 pm, and another prior to  ER arrival. Pt describes chest pain as anterior chest pain that moves towards the neck area which was sharp and constant. Pt had some associated dyspnea. He had no diaphoresis, nausea. Pt called 911, EMS gave him nitro and he feels better. Pt reports some exertional dyspnea the last few days. No orthopnea. No new cough. Pt has no hx of PE, DVT and denies any exogenous hormone use, long distance travels or surgery in the past 6 weeks, active cancer, recent immobilization. No hx of stomach ulcers, no new cough.   Past Medical History:  Diagnosis Date  . CAD (coronary artery disease)    a. non obst dz by cath 2004  b. 11/12/15 which showed no obvious large vessel CAD. No PCI or clear reason for NSTEMI ( possibly hypertensive urgency?).   [name] CARDIOMYOPATHY    a. NICM, LVEF 30% 01/2011 echo; 20-25% 07/2012 echo, EF recovered by echo 06/2015.  [name] DIABETES MELLITUS, TYPE II   . HYPERCHOLESTEROLEMIA   . HYPERTENSION   . Myocardial infarct 10/2015  . Noncompliance   . Prostate cancer [facility])    prostate cancer  . PVCs (premature ventricular contractions)    a. s/p ablation 01-07-2013 by Dr Rayann Heman    Patient Active Problem List   Diagnosis Date Noted  . NSTEMI (non-ST elevated myocardial infarction) ([place]) 11/13/2015  . PVCs (premature ventricular contractions)   . CAD (coronary artery disease)   . Acute kidney injury ([place]) 07/19/2015  . Dyspnea 07/19/2015  . AKI (acute kidney injury) ([place]) 07/19/2015    . Hyperglycemia 07/12/2015  . Thigh pain 10/25/2014  . Prostate cancer ([place]) 02/22/2014  . Cholelithiasis and cholecystitis without obstruction 11/16/2013  . PVC's (premature ventricular contractions) 01/07/2013  . CKD (chronic kidney disease) stage 2, GFR 60-89 ml/min 08/25/2012  . OSA (obstructive sleep apnea) 08/25/2012  . Chronic systolic heart failure ([place]) 08/06/2012  . HTN (hypertension), malignant 02/12/2011  . Non compliance w medication regimen 02/12/2011  . HYPERCHOLESTEROLEMIA 09/17/2009  . PREMATURE VENTRICULAR CONTRACTIONS 09/17/2009  . PSA, INCREASED 04/26/2009  . Diabetes type 2, uncontrolled ([place]) 04/25/2009  . CARDIOMYOPATHY 04/25/2009    Past Surgical History:  Procedure Laterality Date  . ABLATION  01-07-2013   RVOT PVC's ablated by Dr Rayann Heman along anteroseptal RVOT  . CARDIAC CATHETERIZATION  2004   nonobst dz  . CARDIAC CATHETERIZATION N/A 11/12/2015   Procedure: Left Heart Cath and Coronary Angiography;  Surgeon: Sherren Mocha, MD;  Location: [facility] CV LAB;  Service: Cardiovascular;  Laterality: N/A;  . CHOLECYSTECTOMY N/A 11/16/2013   Procedure: LAPAROSCOPIC CHOLECYSTECTOMY WITH INTRAOPERATIVE CHOLANGIOGRAM;  Surgeon: [name], MD;  Location: [facility] ORS;  Service: General;  Laterality: N/A;  . LYMPHADENECTOMY Bilateral 02/22/2014   Procedure: PELVIC LYMPH NODE DISSECTION;  Surgeon: [name], MD;  Location: [facility] ORS;  Service: Urology;  Laterality: Bilateral;  . ROBOT ASSISTED LAPAROSCOPIC RADICAL PROSTATECTOMY N/A 02/22/2014   Procedure: ROBOTIC ASSISTED LAPAROSCOPIC RADICAL  PROSTATECTOMY WITH INDOCYANINE GREEN DYE AND OPEN UMBILICAL HERNIA REPAIR;  Surgeon: [name], MD;  Location: [facility] ORS;  Service: Urology;  Laterality: N/A;  . SUPRAVENTRICULAR TACHYCARDIA ABLATION N/A 09/28/2012   Procedure: Tanna Furry Ablation;  Surgeon: [name], MD;  Location: [facility] CATH LAB;  Service: Cardiovascular;  Laterality: N/A;  . SURGERY SCROTAL / TESTICULAR      at age 21  . V-TACH ABLATION N/A 01/07/2013   Procedure: V-TACH ABLATION;  Surgeon: [name], MD;  Location: [place] CATH LAB;  Service: Cardiovascular;  Laterality: N/A;       Home Medications    Prior to Admission medications   Medication Sig Start Date End Date Taking? Authorizing Provider  aspirin EC 81 MG EC tablet Take 1 tablet (81 mg total) by mouth daily. 11/14/15  Yes Eileen Stanford, PA-C  atorvastatin (LIPITOR) 80 MG tablet Take 1 tablet (80 mg total) by mouth daily at 6 PM. 11/13/15  Yes Eileen Stanford, PA-C  carvedilol (COREG) 12.5 MG tablet Take 1 tablet (12.5 mg total) by mouth 2 (two) times daily with a meal. 12/11/15  Yes Jerline Pain, MD  clopidogrel (PLAVIX) 75 MG tablet Take 1 tablet (75 mg total) by mouth daily. 11/13/15  Yes Eileen Stanford, PA-C  hydrochlorothiazide (HYDRODIURIL) 12.5 MG tablet Take 1 tablet (12.5 mg total) by mouth daily. 11/23/15  Yes Eileen Stanford, PA-C  insulin glargine (LANTUS) 100 unit/mL SOPN Inject 20 Units into the skin at bedtime.   Yes Historical Provider, MD  isosorbide mononitrate (IMDUR) 60 MG 24 hr tablet Take 1 tablet (60 mg total) by mouth daily. 11/23/15  Yes Eileen Stanford, PA-C  lisinopril (PRINIVIL,ZESTRIL) 40 MG tablet Take 1 tablet (40 mg total) by mouth daily. 11/23/15  Yes Eileen Stanford, PA-C    Family History Family History  Problem Relation Age of Onset  . Hypertension Mother   . Peripheral vascular disease Mother   . Peripheral vascular disease    . Hypertension    . Prostate cancer    . Prostate cancer Brother   . Heart attack Paternal Grandfather   . Heart failure Maternal Grandmother     Social History Social History  Substance Use Topics  . Smoking status: Never Smoker  . 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 Alcohol use No     Comment: rare     Allergies   Ambien [zolpidem tartrate] and Metformin and  related   Review of Systems Review of Systems  ROS 10 Systems reviewed and are negative for acute change except as noted in the HPI.     Physical Exam Updated Vital Signs BP 155/90   Pulse 68   Temp 97.8 F (36.6 C)   Resp 19   Ht 5\' 5"  (1.651 m)   Wt 188 lb (85.3 kg)   SpO2 100%   BMI 31.28 kg/m   Physical Exam  Constitutional: He is oriented to person, place, and time. He appears well-developed.  HENT:  Head: Normocephalic and atraumatic.  Eyes: Conjunctivae and EOM are normal. Pupils are equal, round, and reactive to light.  Neck: Normal range of motion. Neck supple.  Cardiovascular: Normal rate, regular rhythm and intact distal pulses.   Pulmonary/Chest: Effort normal and breath sounds normal.  Abdominal: Soft. Bowel sounds are normal. He exhibits no distension. There is no tenderness. There is no rebound and no guarding.  Musculoskeletal: He  exhibits no edema or tenderness.  Neurological: He is alert and oriented to person, place, and time.  Skin: Skin is warm.  Nursing note and vitals reviewed.    ED Treatments / Results  Labs (all labs ordered are listed, but only abnormal results are displayed) Labs Reviewed  BASIC METABOLIC PANEL - Abnormal; Notable for the following:       Result Value   Sodium 134 (*)    CO2 20 (*)    Glucose, Bld 506 (*)    Creatinine, Ser 1.36 (*)    GFR calc non Af Amer 55 (*)    All other components within normal limits  TROPONIN I - Abnormal; Notable for the following:    Troponin I 0.04 (*)    All other components within normal limits  TROPONIN I - Abnormal; Notable for the following:    Troponin I 0.04 (*)    All other components within normal limits  CBG MONITORING, ED - Abnormal; Notable for the following:    Glucose-Capillary 478 (*)    All other components within normal limits  CBC  PROTIME-INR  APTT  BRAIN NATRIURETIC PEPTIDE  TROPONIN I    EKG  EKG Interpretation  Date/Time:  Friday January 25 2016  23:12:45 EST Ventricular Rate:  61 PR Interval:    QRS Duration: 136 QT Interval:  420 QTC Calculation: 423 R Axis:   -58 Text Interpretation:  Sinus rhythm Multiform ventricular premature complexes Nonspecific IVCD with LAD Left ventricular hypertrophy Nonspecific T abnormalities, inferior leads and lateral leads - unchanged ST elevation, consider anterior injury - not new No significant change since last tracing Confirmed by Kathrynn Humble, MD, Thelma Comp [phone]) on 01/25/2016 11:22:24 PM       Radiology Dg Chest Portable 1 View  Result Date: 01/25/2016 CLINICAL DATA:  Acute onset of generalized chest pain. Hyperglycemia and high blood pressure. Initial encounter. EXAM: PORTABLE CHEST 1 VIEW COMPARISON:  Chest radiograph performed 11/10/2015 FINDINGS: The lungs are well-aerated and clear. There is no evidence of focal opacification, pleural effusion or pneumothorax. The cardiomediastinal silhouette is within normal limits. No acute osseous abnormalities are seen. IMPRESSION: No acute cardiopulmonary process seen. Electronically Signed   By: Garald Balding M.D.   On: 01/25/2016 23:34     Procedures Procedures (including critical care time)  Medications Ordered in ED Medications  insulin glargine (LANTUS) injection 20 Units (20 Units Subcutaneous Given 01/26/16 0146)  acetaminophen (TYLENOL) tablet 650 mg (650 mg Oral Given 01/26/16 0444)     Initial Impression / Assessment and Plan / ED Course  I have reviewed the triage vital signs and the nursing notes.  Pertinent labs & imaging results that were available during my care of the patient were reviewed by me and considered in my medical decision making (see chart for details).   LEFT SIDED HEART CATH WAS NEGATIVE FROM FEW MONTHS BACK:   Mid RCA lesion, 30 %stenosed.  Mid LAD lesion, 25 %stenosed.  1st Diag lesion, 30 %stenosed.  There is hyperdynamic left ventricular systolic function.  The left ventricular ejection fraction is  greater than 65% by visual estimate.   1. Mild nonobstructive CAD as outlined, with mild plaque in the mid-RCA, mid-LAD, and first diagonal branch 2. Vigorous LV systolic function with normal LVEDP  Clinical Course as of Jan 25 501  Sat Jan 26, 2016  0200 Pt is still pain free. Initial trop is 0.4. With NSTEMI trops were much higher. Trops are essentially reassuring, and given no active chest  pain, we will get trops x 2, which if neg - we will d/c.  [AN]  0459 Pt remained chest pain free during the entire ER stay, and his trops x 2 are not showing any significant elevation. We gave him night dose lantus.  Results from the ER workup discussed with the patient face to face and all questions answered to the best of my ability. Pt aware of the trops being detectable, but essentially neg.  Strict ER return precautions have been discussed, and patient is agreeing with the plan and is comfortable with the workup done and the recommendations from the ER. Advised Cards f/u - to see if these pains are related to vasospasms.  [AN]    Clinical Course User Index [AN] Varney Biles, MD   Differential diagnosis includes: ACS syndrome CHF exacerbation Valvular disorder Myocarditis Pericarditis Pericardial effusion Pneumonia Pleural effusion Pulmonary edema PE Anemia Musculoskeletal pain Dissection Esophageal spasm  Pt comes in with cc of chest pain.  Chest pain was severe and similar to his NSTEMI pain. PT was admitted for NSTEMI and had new echo and L sided cath - pt has non obstructive CAD per cath report. Pt has no dissection or PE risk factors. No cough/uri like symptoms. Pt is now pain free. He did receive nitro. Esophageal spasm possible, coronary vasospasm possible. We will get ACS labs. Pain free, ekg is reassuring.    Final Clinical Impressions(s) / ED Diagnoses   Final diagnoses:  Chest pain, unspecified type    New Prescriptions New Prescriptions   No medications on  file     Varney Biles, MD 01/26/16 0502

## 2016-01-25 NOTE — ED Notes (Signed)
Portable chest x-ray at bedside at this time. 

## 2016-01-25 NOTE — ED Notes (Signed)
Dr. Nanavati at bedside at this time.  

## 2016-01-25 NOTE — ED Notes (Signed)
Phlebotomy at bedside at this time.

## 2016-01-26 ENCOUNTER — Encounter (HOSPITAL_COMMUNITY): Payer: Self-pay | Admitting: Emergency Medicine

## 2016-01-26 ENCOUNTER — Emergency Department (HOSPITAL_COMMUNITY): Payer: BLUE CROSS/BLUE SHIELD

## 2016-01-26 ENCOUNTER — Emergency Department (HOSPITAL_COMMUNITY)
Admission: EM | Admit: 2016-01-26 | Discharge: 2016-01-26 | Disposition: A | Discharge status: Home / Self Care | Payer: BLUE CROSS/BLUE SHIELD | Source: Home / Self Care | Attending: Emergency Medicine | Admitting: Emergency Medicine

## 2016-01-26 DIAGNOSIS — I2510 Atherosclerotic heart disease of native coronary artery without angina pectoris: Secondary | ICD-10-CM | POA: Insufficient documentation

## 2016-01-26 DIAGNOSIS — Z8546 Personal history of malignant neoplasm of prostate: Secondary | ICD-10-CM | POA: Insufficient documentation

## 2016-01-26 DIAGNOSIS — Z7982 Long term (current) use of aspirin: Secondary | ICD-10-CM

## 2016-01-26 DIAGNOSIS — I252 Old myocardial infarction: Secondary | ICD-10-CM | POA: Insufficient documentation

## 2016-01-26 DIAGNOSIS — I130 Hypertensive heart and chronic kidney disease with heart failure and stage 1 through stage 4 chronic kidney disease, or unspecified chronic kidney disease: Secondary | ICD-10-CM

## 2016-01-26 DIAGNOSIS — I5022 Chronic systolic (congestive) heart failure: Secondary | ICD-10-CM | POA: Insufficient documentation

## 2016-01-26 DIAGNOSIS — N182 Chronic kidney disease, stage 2 (mild): Secondary | ICD-10-CM | POA: Insufficient documentation

## 2016-01-26 DIAGNOSIS — E1122 Type 2 diabetes mellitus with diabetic chronic kidney disease: Secondary | ICD-10-CM

## 2016-01-26 DIAGNOSIS — R079 Chest pain, unspecified: Secondary | ICD-10-CM

## 2016-01-26 DIAGNOSIS — R072 Precordial pain: Secondary | ICD-10-CM

## 2016-01-26 DIAGNOSIS — Z794 Long term (current) use of insulin: Secondary | ICD-10-CM | POA: Insufficient documentation

## 2016-01-26 LAB — BASIC METABOLIC PANEL
Anion gap: 11 (ref 5–15)
Anion gap: 8 (ref 5–15)
BUN: 19 mg/dL (ref 6–20)
BUN: 17 mg/dL (ref 6–20)
CO2: 20 mmol/L — ABNORMAL LOW (ref 22–32)
CO2: 25 mmol/L (ref 22–32)
Calcium: 9.5 mg/dL (ref 8.9–10.3)
Calcium: 10 mg/dL (ref 8.9–10.3)
Chloride: 103 mmol/L (ref 101–111)
Chloride: 105 mmol/L (ref 101–111)
Creatinine, Ser: 1.36 mg/dL — ABNORMAL HIGH (ref 0.61–1.24)
Creatinine, Ser: 1.36 mg/dL — ABNORMAL HIGH (ref 0.61–1.24)
GFR calc Af Amer: >60 mL/min (ref >60)
GFR calc Af Amer: >60 mL/min (ref >60)
GFR calc non Af Amer: 55 mL/min — ABNORMAL LOW (ref >60)
GFR calc non Af Amer: 55 mL/min — ABNORMAL LOW (ref >60)
Glucose, Bld: 506 mg/dL (ref 65–99)
Glucose, Bld: 359 mg/dL — ABNORMAL HIGH (ref 65–99)
Potassium: 4.5 mmol/L (ref 3.5–5.1)
Potassium: 4.3 mmol/L (ref 3.5–5.1)
Sodium: 134 mmol/L — ABNORMAL LOW (ref 135–145)
Sodium: 138 mmol/L (ref 135–145)

## 2016-01-26 LAB — CBC
HCT: 39.7 % (ref 39.0–52.0)
HCT: 40.3 % (ref 39.0–52.0)
Hemoglobin: 13.4 g/dL (ref 13.0–17.0)
Hemoglobin: 13.9 g/dL (ref 13.0–17.0)
MCH: 29.8 pg (ref 26.0–34.0)
MCH: 29.6 pg (ref 26.0–34.0)
MCHC: 33.8 g/dL (ref 30.0–36.0)
MCHC: 34.5 g/dL (ref 30.0–36.0)
MCV: 88.2 fL (ref 78.0–100.0)
MCV: 85.9 fL (ref 78.0–100.0)
Platelets: 226 10*3/uL (ref 150–400)
Platelets: 258 10*3/uL (ref 150–400)
RBC: 4.5 MIL/uL (ref 4.22–5.81)
RBC: 4.69 MIL/uL (ref 4.22–5.81)
RDW: 12.6 % (ref 11.5–15.5)
RDW: 12.4 % (ref 11.5–15.5)
WBC: 7.5 10*3/uL (ref 4.0–10.5)
WBC: 7.6 10*3/uL (ref 4.0–10.5)

## 2016-01-26 LAB — TROPONIN I
Troponin I: 0.04 ng/mL (ref <0.03)
Troponin I: 0.04 ng/mL (ref <0.03)

## 2016-01-26 LAB — I-STAT TROPONIN, ED: Troponin i, poc: 0.01 ng/mL (ref 0.00–0.08)

## 2016-01-26 LAB — APTT: aPTT: 27 seconds (ref 24–36)

## 2016-01-26 LAB — PROTIME-INR
INR: 0.95
Prothrombin Time: 12.7 seconds (ref 11.4–15.2)

## 2016-01-26 LAB — BRAIN NATRIURETIC PEPTIDE: B Natriuretic Peptide: 16.9 pg/mL (ref 0.0–100.0)

## 2016-01-26 MED ORDER — OMEPRAZOLE 20 MG PO CPDR: 20.0000 mg | DELAYED_RELEASE_CAPSULE | Freq: Two times a day (BID) | ORAL | 0 refills | Status: DC

## 2016-01-26 MED ORDER — ACETAMINOPHEN 325 MG PO TABS
650.0000 mg | ORAL_TABLET | Freq: Once | ORAL | Status: AC
  Administered 2016-01-26: 650 mg via ORAL
  Filled 2016-01-26: qty 2

## 2016-01-26 MED ORDER — GI COCKTAIL ~~LOC~~
30.0000 mL | Freq: Once | ORAL | Status: AC
  Administered 2016-01-26: 30 mL via ORAL
  Filled 2016-01-26: qty 30

## 2016-01-26 MED ORDER — PANTOPRAZOLE SODIUM 40 MG PO TBEC
40.0000 mg | DELAYED_RELEASE_TABLET | Freq: Once | ORAL | Status: AC
Start: 2016-01-26 — End: 2016-01-26
  Administered 2016-01-26: 40 mg via ORAL
  Filled 2016-01-26: qty 1

## 2016-01-26 MED ORDER — INSULIN GLARGINE 100 UNIT/ML ~~LOC~~ SOLN
20.0000 [IU] | Freq: Every day | SUBCUTANEOUS | Status: DC
  Administered 2016-01-26: 20 [IU] via SUBCUTANEOUS
  Filled 2016-01-26: qty 0.2

## 2016-01-26 MED ORDER — FAMOTIDINE 20 MG PO TABS
20.0000 mg | ORAL_TABLET | Freq: Once | ORAL | Status: AC
  Administered 2016-01-26: 20 mg via ORAL
  Filled 2016-01-26: qty 1

## 2016-01-26 NOTE — Discharge Instructions (Signed)

## 2016-01-26 NOTE — ED Triage Notes (Signed)
Pt d/c this morning at 4am from ED with complaints of CP. No abnormalities found. Pt called EMS with complaints of continued CP beginning again at 0530 am intermittently. Pain radiates to L side of neck. Denies other symptoms. EMS gave 324mg  ASA, 1nitro. Pain 0/10 on arrival to ED. BP 142/70, CBG 420, 76 SR

## 2016-01-26 NOTE — ED Provider Notes (Signed)
[facility] DEPT Provider Note   CSN: [id number] Arrival date & time: 01/26/16  [id number]  By signing my name below, I, Sonum Patel, attest that this documentation has been prepared under the direction and in the presence of Virgel Manifold, MD. Electronically Signed: Sonum Patel, Education administrator. 01/26/16. 10:28 AM.  History   Chief Complaint Chief Complaint  Patient presents with  . Chest Pain    The history is provided by the patient. No language interpreter was used.    HPI Comments: [name] is a 60 y.o. male who presents to the Emergency Department complaining of 2 episodes of substernal CP with radiation to the bilateral neck that occurred PTA. He states the most recent episode lasted about 10 min. He describes the pain as sharp and likens it to acid reflux though he denies history of this. He states the pain is unchanged with exertion and returned after being discharged this morning ~5AM. He was seen for the same symptoms here at that time. He states he has not eaten anything since yesterday. He states EMS administered ASA and nitro en route this morning. He denies diaphoresis, nausea. He reports a history of an MI but states this feels different.   Past Medical History:  Diagnosis Date  . CAD (coronary artery disease)    a. non obst dz by cath 2004  b. 11/12/15 which showed no obvious large vessel CAD. No PCI or clear reason for NSTEMI ( possibly hypertensive urgency?).   [name] CARDIOMYOPATHY    a. NICM, LVEF 30% 01/2011 echo; 20-25% 07/2012 echo, EF recovered by echo 06/2015.  [name] DIABETES MELLITUS, TYPE II   . HYPERCHOLESTEROLEMIA   . HYPERTENSION   . Myocardial infarct 10/2015  . Noncompliance   . Prostate cancer [facility])    prostate cancer  . PVCs (premature ventricular contractions)    a. s/p ablation 01-07-2013 by Dr Rayann Heman    Patient Active Problem List   Diagnosis Date Noted  . NSTEMI (non-ST elevated myocardial infarction) ([place]) 11/13/2015  . PVCs (premature ventricular  contractions)   . CAD (coronary artery disease)   . Acute kidney injury ([place]) 07/19/2015  . Dyspnea 07/19/2015  . AKI (acute kidney injury) ([place]) 07/19/2015  . Hyperglycemia 07/12/2015  . Thigh pain 10/25/2014  . Prostate cancer ([place]) 02/22/2014  . Cholelithiasis and cholecystitis without obstruction 11/16/2013  . PVC's (premature ventricular contractions) 01/07/2013  . CKD (chronic kidney disease) stage 2, GFR 60-89 ml/min 08/25/2012  . OSA (obstructive sleep apnea) 08/25/2012  . Chronic systolic heart failure ([place]) 08/06/2012  . HTN (hypertension), malignant 02/12/2011  . Non compliance w medication regimen 02/12/2011  . HYPERCHOLESTEROLEMIA 09/17/2009  . PREMATURE VENTRICULAR CONTRACTIONS 09/17/2009  . PSA, INCREASED 04/26/2009  . Diabetes type 2, uncontrolled ([place]) 04/25/2009  . CARDIOMYOPATHY 04/25/2009    Past Surgical History:  Procedure Laterality Date  . ABLATION  01-07-2013   RVOT PVC's ablated by Dr Rayann Heman along anteroseptal RVOT  . CARDIAC CATHETERIZATION  2004   nonobst dz  . CARDIAC CATHETERIZATION N/A 11/12/2015   Procedure: Left Heart Cath and Coronary Angiography;  Surgeon: Sherren Mocha, MD;  Location: [facility] LAB;  Service: Cardiovascular;  Laterality: N/A;  . CHOLECYSTECTOMY N/A 11/16/2013   Procedure: LAPAROSCOPIC CHOLECYSTECTOMY WITH INTRAOPERATIVE CHOLANGIOGRAM;  Surgeon: [name], MD;  Location: [facility];  Service: General;  Laterality: N/A;  . LYMPHADENECTOMY Bilateral 02/22/2014   Procedure: PELVIC LYMPH NODE DISSECTION;  Surgeon: [name], MD;  Location: [facility];  Service: Urology;  Laterality: Bilateral;  .  ROBOT ASSISTED LAPAROSCOPIC RADICAL PROSTATECTOMY N/A 02/22/2014   Procedure: ROBOTIC ASSISTED LAPAROSCOPIC RADICAL PROSTATECTOMY WITH INDOCYANINE GREEN DYE AND OPEN UMBILICAL HERNIA REPAIR;  Surgeon: [name], MD;  Location: [facility];  Service: Urology;  Laterality: N/A;  . SUPRAVENTRICULAR TACHYCARDIA ABLATION N/A 09/28/2012    Procedure: Tanna Furry Ablation;  Surgeon: [name], MD;  Location: [facility] CATH LAB;  Service: Cardiovascular;  Laterality: N/A;  . SURGERY SCROTAL / TESTICULAR     at age 30  . V-TACH ABLATION N/A 01/07/2013   Procedure: V-TACH ABLATION;  Surgeon: [name], MD;  Location: [facility] CATH LAB;  Service: Cardiovascular;  Laterality: N/A;       Home Medications    Prior to Admission medications   Medication Sig Start Date End Date Taking? Authorizing Provider  aspirin EC 81 MG EC tablet Take 1 tablet (81 mg total) by mouth daily. 11/14/15   Eileen Stanford, PA-C  atorvastatin (LIPITOR) 80 MG tablet Take 1 tablet (80 mg total) by mouth daily at 6 PM. 11/13/15   Eileen Stanford, PA-C  carvedilol (COREG) 12.5 MG tablet Take 1 tablet (12.5 mg total) by mouth 2 (two) times daily with a meal. 12/11/15   Jerline Pain, MD  clopidogrel (PLAVIX) 75 MG tablet Take 1 tablet (75 mg total) by mouth daily. 11/13/15   Eileen Stanford, PA-C  hydrochlorothiazide (HYDRODIURIL) 12.5 MG tablet Take 1 tablet (12.5 mg total) by mouth daily. 11/23/15   Eileen Stanford, PA-C  insulin glargine (LANTUS) 100 unit/mL SOPN Inject 20 Units into the skin at bedtime.    Historical Provider, MD  isosorbide mononitrate (IMDUR) 60 MG 24 hr tablet Take 1 tablet (60 mg total) by mouth daily. 11/23/15   Eileen Stanford, PA-C  lisinopril (PRINIVIL,ZESTRIL) 40 MG tablet Take 1 tablet (40 mg total) by mouth daily. 11/23/15   Eileen Stanford, PA-C    Family History Family History  Problem Relation Age of Onset  . Hypertension Mother   . Peripheral vascular disease Mother   . Peripheral vascular disease    . Hypertension    . Prostate cancer    . Prostate cancer Brother   . Heart attack Paternal Grandfather   . Heart failure Maternal Grandmother     Social History Social History  Substance Use Topics  . Smoking status: Never Smoker  . 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 Alcohol use No     Comment: rare     Allergies   Ambien [zolpidem tartrate] and Metformin and related   Review of Systems Review of Systems  A complete 10 system review of systems was obtained and all systems are negative except as noted in the HPI and PMH.    Physical Exam Updated Vital Signs BP 168/87 (BP Location: Right Arm)   Pulse 66   Resp 18   Ht 5\' 5"  (1.651 m)   Wt 188 lb (85.3 kg)   SpO2 98%   BMI 31.28 kg/m   Physical Exam  Constitutional: He is oriented to person, place, and time. He appears well-developed and well-nourished.  HENT:  Head: Normocephalic and atraumatic.  Eyes: EOM are normal.  Neck: Normal range of motion.  Cardiovascular: Normal rate, regular rhythm, normal heart sounds and intact distal pulses.   Pulmonary/Chest: Effort normal and breath sounds normal. No respiratory distress.  Abdominal: Soft. He exhibits no distension. There is no tenderness.  Musculoskeletal:  Normal range of motion.  Neurological: He is alert and oriented to person, place, and time.  Skin: Skin is warm and dry.  Psychiatric: He has a normal mood and affect. Judgment normal.  Nursing note and vitals reviewed.    ED Treatments / Results  DIAGNOSTIC STUDIES: Oxygen Saturation is 98% on RA, normal by my interpretation.    COORDINATION OF CARE: 10:28 AM Discussed treatment plan with pt at bedside and pt agreed to plan.   Labs (all labs ordered are listed, but only abnormal results are displayed) Labs Reviewed  [place], ED    EKG  EKG Interpretation None       Radiology Dg Chest Portable 1 View  Result Date: 01/25/2016 CLINICAL DATA:  Acute onset of generalized chest pain. Hyperglycemia and high blood pressure. Initial encounter. EXAM: PORTABLE CHEST 1 VIEW COMPARISON:  Chest radiograph performed 11/10/2015 FINDINGS: The lungs are well-aerated and clear. There is no evidence of focal  opacification, pleural effusion or pneumothorax. The cardiomediastinal silhouette is within normal limits. No acute osseous abnormalities are seen. IMPRESSION: No acute cardiopulmonary process seen. Electronically Signed   By: Garald Balding M.D.   On: 01/25/2016 23:34    Procedures Procedures (including critical care time)  Medications Ordered in ED Medications - No data to display   Initial Impression / Assessment and Plan / ED Course  I have reviewed the triage vital signs and the nursing notes.  Pertinent labs & imaging results that were available during my care of the patient were reviewed by me and considered in my medical decision making (see chart for details).  Clinical Course     60yM with CP. Second ED visit in the past day for the same. Troponin normal. EKG w/o acute changes. LHC two months ago with mild nonobstructive CAD. May be GI? Worsened when went home to lay down. Doubt PE, dissection or other emergent process.   Final Clinical Impressions(s) / ED Diagnoses   Final diagnoses:  Chest pain, unspecified type    New Prescriptions New Prescriptions   No medications on file   I personally preformed the services scribed in my presence. The recorded information has been reviewed is accurate. Virgel Manifold, MD.    Virgel Manifold, MD 01/31/16 [phone]

## 2016-01-26 NOTE — ED Notes (Signed)
Patient transported to X-ray 

## 2016-02-27 ENCOUNTER — Emergency Department (HOSPITAL_COMMUNITY): Payer: BLUE CROSS/BLUE SHIELD

## 2016-02-27 ENCOUNTER — Emergency Department (HOSPITAL_COMMUNITY)
Admission: EM | Admit: 2016-02-27 | Discharge: 2016-02-27 | Disposition: A | Discharge status: Home / Self Care | Payer: BLUE CROSS/BLUE SHIELD | Attending: Emergency Medicine | Admitting: Emergency Medicine

## 2016-02-27 ENCOUNTER — Encounter (HOSPITAL_COMMUNITY): Payer: Self-pay

## 2016-02-27 DIAGNOSIS — I2510 Atherosclerotic heart disease of native coronary artery without angina pectoris: Secondary | ICD-10-CM | POA: Diagnosis not present

## 2016-02-27 DIAGNOSIS — E1122 Type 2 diabetes mellitus with diabetic chronic kidney disease: Secondary | ICD-10-CM | POA: Diagnosis not present

## 2016-02-27 DIAGNOSIS — R0789 Other chest pain: Secondary | ICD-10-CM | POA: Diagnosis not present

## 2016-02-27 DIAGNOSIS — R079 Chest pain, unspecified: Secondary | ICD-10-CM

## 2016-02-27 DIAGNOSIS — I252 Old myocardial infarction: Secondary | ICD-10-CM | POA: Diagnosis not present

## 2016-02-27 DIAGNOSIS — Z7982 Long term (current) use of aspirin: Secondary | ICD-10-CM | POA: Insufficient documentation

## 2016-02-27 DIAGNOSIS — Z8546 Personal history of malignant neoplasm of prostate: Secondary | ICD-10-CM | POA: Insufficient documentation

## 2016-02-27 DIAGNOSIS — N182 Chronic kidney disease, stage 2 (mild): Secondary | ICD-10-CM | POA: Diagnosis not present

## 2016-02-27 DIAGNOSIS — I129 Hypertensive chronic kidney disease with stage 1 through stage 4 chronic kidney disease, or unspecified chronic kidney disease: Secondary | ICD-10-CM | POA: Insufficient documentation

## 2016-02-27 DIAGNOSIS — Z79899 Other long term (current) drug therapy: Secondary | ICD-10-CM | POA: Insufficient documentation

## 2016-02-27 LAB — BASIC METABOLIC PANEL
Anion gap: 8 (ref 5–15)
BUN: 22 mg/dL — ABNORMAL HIGH (ref 6–20)
CO2: 24 mmol/L (ref 22–32)
Calcium: 10 mg/dL (ref 8.9–10.3)
Chloride: 101 mmol/L (ref 101–111)
Creatinine, Ser: 1.38 mg/dL — ABNORMAL HIGH (ref 0.61–1.24)
GFR calc Af Amer: >60 mL/min (ref >60)
GFR calc non Af Amer: 54 mL/min — ABNORMAL LOW (ref >60)
Glucose, Bld: 380 mg/dL — ABNORMAL HIGH (ref 65–99)
Potassium: 4.4 mmol/L (ref 3.5–5.1)
Sodium: 133 mmol/L — ABNORMAL LOW (ref 135–145)

## 2016-02-27 LAB — CBC
HCT: 40 % (ref 39.0–52.0)
Hemoglobin: 13.9 g/dL (ref 13.0–17.0)
MCH: 29.6 pg (ref 26.0–34.0)
MCHC: 34.8 g/dL (ref 30.0–36.0)
MCV: 85.1 fL (ref 78.0–100.0)
Platelets: 267 10*3/uL (ref 150–400)
RBC: 4.7 MIL/uL (ref 4.22–5.81)
RDW: 12.4 % (ref 11.5–15.5)
WBC: 8.1 10*3/uL (ref 4.0–10.5)

## 2016-02-27 LAB — I-STAT TROPONIN, ED: Troponin i, poc: 0.02 ng/mL (ref 0.00–0.08)

## 2016-02-27 NOTE — ED Provider Notes (Signed)
[facility] DEPT Provider Note   CSN: [id number] Arrival date & time: 02/27/16  1208     History   Chief Complaint Chief Complaint  Patient presents with  . Chest Pain    HPI TRINA WINNIE is a 61 y.o. male.  Patient is a 61 year old male with history of hypertension, diabetes, and recent admission for elevated troponin thought to be related to a hypertensive urgency. He presents today with complaints of chest discomfort. This started yesterday evening after eating a piece of fried chicken. His discomfort is described as a sharp pain that comes and goes. He denies any shortness of breath, nausea, or diaphoresis. He denies any exertional component.  He reports admission in October 2017 for possible elevated heart enzymes. He reports he had a heart cath performed which revealed no significant stenoses.   The history is provided by the patient.  Chest Pain   This is a recurrent problem. The current episode started yesterday. Episode frequency: Every few hours. The problem has not changed since onset.The pain is present in the substernal region. The pain is moderate.    Past Medical History:  Diagnosis Date  . CAD (coronary artery disease)    a. non obst dz by cath 2004  b. 11/12/15 which showed no obvious large vessel CAD. No PCI or clear reason for NSTEMI ( possibly hypertensive urgency?).   [name] CARDIOMYOPATHY    a. NICM, LVEF 30% 01/2011 echo; 20-25% 07/2012 echo, EF recovered by echo 06/2015.  [name] DIABETES MELLITUS, TYPE II   . HYPERCHOLESTEROLEMIA   . HYPERTENSION   . Myocardial infarct 10/2015  . Noncompliance   . Prostate cancer [facility])    prostate cancer  . PVCs (premature ventricular contractions)    a. s/p ablation 01-07-2013 by Dr Rayann Heman    Patient Active Problem List   Diagnosis Date Noted  . NSTEMI (non-ST elevated myocardial infarction) ([place]) 11/13/2015  . PVCs (premature ventricular contractions)   . CAD (coronary artery disease)   . Acute kidney injury  ([place]) 07/19/2015  . Dyspnea 07/19/2015  . AKI (acute kidney injury) ([place]) 07/19/2015  . Hyperglycemia 07/12/2015  . Thigh pain 10/25/2014  . Prostate cancer ([place]) 02/22/2014  . Cholelithiasis and cholecystitis without obstruction 11/16/2013  . PVC's (premature ventricular contractions) 01/07/2013  . CKD (chronic kidney disease) stage 2, GFR 60-89 ml/min 08/25/2012  . OSA (obstructive sleep apnea) 08/25/2012  . Chronic systolic heart failure ([place]) 08/06/2012  . HTN (hypertension), malignant 02/12/2011  . Non compliance w medication regimen 02/12/2011  . HYPERCHOLESTEROLEMIA 09/17/2009  . PREMATURE VENTRICULAR CONTRACTIONS 09/17/2009  . PSA, INCREASED 04/26/2009  . Diabetes type 2, uncontrolled ([place]) 04/25/2009  . CARDIOMYOPATHY 04/25/2009    Past Surgical History:  Procedure Laterality Date  . ABLATION  01-07-2013   RVOT PVC's ablated by Dr Rayann Heman along anteroseptal RVOT  . CARDIAC CATHETERIZATION  2004   nonobst dz  . CARDIAC CATHETERIZATION N/A 11/12/2015   Procedure: Left Heart Cath and Coronary Angiography;  Surgeon: Sherren Mocha, MD;  Location: [facility] CV LAB;  Service: Cardiovascular;  Laterality: N/A;  . CHOLECYSTECTOMY N/A 11/16/2013   Procedure: LAPAROSCOPIC CHOLECYSTECTOMY WITH INTRAOPERATIVE CHOLANGIOGRAM;  Surgeon: [name], MD;  [facility];  Service: General;  Laterality: N/A;  . LYMPHADENECTOMY Bilateral 02/22/2014   Procedure: PELVIC LYMPH NODE DISSECTION;  Surgeon: [name], MD;  Location: [facility];  Service: Urology;  Laterality: Bilateral;  . ROBOT ASSISTED LAPAROSCOPIC RADICAL PROSTATECTOMY N/A 02/22/2014   Procedure: ROBOTIC ASSISTED LAPAROSCOPIC RADICAL PROSTATECTOMY WITH INDOCYANINE  GREEN DYE AND OPEN UMBILICAL HERNIA REPAIR;  Surgeon: [name], MD;  Location: [facility];  Service: Urology;  Laterality: N/A;  . SUPRAVENTRICULAR TACHYCARDIA ABLATION N/A 09/28/2012   Procedure: Tanna Furry Ablation;  Surgeon: [name], MD;  Location: [facility] CATH  LAB;  Service: Cardiovascular;  Laterality: N/A;  . SURGERY SCROTAL / TESTICULAR     at age 89  . V-TACH ABLATION N/A 01/07/2013   Procedure: V-TACH ABLATION;  Surgeon: [name], MD;  Location: [facility] CATH LAB;  Service: Cardiovascular;  Laterality: N/A;       Home Medications    Prior to Admission medications   Medication Sig Start Date End Date Taking? Authorizing Provider  aspirin EC 81 MG EC tablet Take 1 tablet (81 mg total) by mouth daily. 11/14/15   Eileen Stanford, PA-C  atorvastatin (LIPITOR) 80 MG tablet Take 1 tablet (80 mg total) by mouth daily at 6 PM. 11/13/15   Eileen Stanford, PA-C  carvedilol (COREG) 12.5 MG tablet Take 1 tablet (12.5 mg total) by mouth 2 (two) times daily with a meal. 12/11/15   Jerline Pain, MD  clopidogrel (PLAVIX) 75 MG tablet Take 1 tablet (75 mg total) by mouth daily. 11/13/15   Eileen Stanford, PA-C  hydrochlorothiazide (HYDRODIURIL) 12.5 MG tablet Take 1 tablet (12.5 mg total) by mouth daily. 11/23/15   Eileen Stanford, PA-C  insulin glargine (LANTUS) 100 unit/mL SOPN Inject 20 Units into the skin at bedtime.    Historical Provider, MD  isosorbide mononitrate (IMDUR) 60 MG 24 hr tablet Take 1 tablet (60 mg total) by mouth daily. 11/23/15   Eileen Stanford, PA-C  lisinopril (PRINIVIL,ZESTRIL) 40 MG tablet Take 1 tablet (40 mg total) by mouth daily. 11/23/15   Eileen Stanford, PA-C  omeprazole (PRILOSEC) 20 MG capsule Take 1 capsule (20 mg total) by mouth 2 (two) times daily before a meal. 01/26/16   Virgel Manifold, MD    Family History Family History  Problem Relation Age of Onset  . Hypertension Mother   . Peripheral vascular disease Mother   . Peripheral vascular disease    . Hypertension    . Prostate cancer    . Prostate cancer Brother   . Heart attack Paternal Grandfather   . Heart failure Maternal Grandmother     Social History Social History  Substance Use Topics  . Smoking status: Never Smoker  . 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 Alcohol use No     Comment: rare     Allergies   Ambien [zolpidem tartrate] and Metformin and related   Review of Systems Review of Systems  Cardiovascular: Positive for chest pain.  All other systems reviewed and are negative.    Physical Exam Updated Vital Signs Pulse 66   Temp 97.4 F (36.3 C) (Oral)   Resp 13   Ht 5\' 5"  (1.651 m)   Wt 186 lb (84.4 kg)   SpO2 100%   BMI 30.95 kg/m   Physical Exam  Constitutional: He is oriented to person, place, and time. He appears well-developed and well-nourished. No distress.  HENT:  Head: Normocephalic and atraumatic.  Mouth/Throat: Oropharynx is clear and moist.  Neck: Normal range of motion. Neck supple.  Cardiovascular: Normal rate and regular rhythm.  Exam reveals no friction rub.   No murmur heard. Pulmonary/Chest: Effort normal and breath sounds normal. No respiratory distress. He has no  wheezes. He has no rales.  Abdominal: Soft. Bowel sounds are normal. He exhibits no distension. There is no tenderness.  Musculoskeletal: Normal range of motion. He exhibits no edema.  Neurological: He is alert and oriented to person, place, and time. Coordination normal.  Skin: Skin is warm and dry. He is not diaphoretic.  Nursing note and vitals reviewed.    ED Treatments / Results  Labs (all labs ordered are listed, but only abnormal results are displayed) Labs Reviewed  [place], ED    EKG  EKG Interpretation  Date/Time:  Wednesday February 27 2016 12:16:44 EST Ventricular Rate:  66 PR Interval:    QRS Duration: 118 QT Interval:  428 QTC Calculation: 449 R Axis:   -40 Text Interpretation:  Sinus rhythm LVH with IVCD, LAD and secondary repol abnrm ? deepening ST changes in inferior leads, though similar morphology in prior Otherwise no significant change e Confirmed by Tyrone Nine MD, DANIEL [phone]),  editor BREWER, [place] [id number]) on 02/27/2016 12:45:31 PM       Radiology No results found.  Procedures Procedures (including critical care time)  Medications Ordered in ED Medications - No data to display   Initial Impression / Assessment and Plan / ED Course  I have reviewed the triage vital signs and the nursing notes.  Pertinent labs & imaging results that were available during my care of the patient were reviewed by me and considered in my medical decision making (see chart for details).     Patient presents with complaints of chest discomfort. His EKG is unchanged from priors and troponin is negative despite his symptoms ongoing since yesterday evening. He had a heart cath in October of last year which revealed no significant stenoses. I highly doubt a cardiac etiology and believe he is appropriate for discharge. He seems to be getting relief with the and I will encourage him to continue that. He will be discharged, to return as needed if his symptoms worsen or change.  Final Clinical Impressions(s) / ED Diagnoses   Final diagnoses:  None    New Prescriptions New Prescriptions   No medications on file     Veryl Speak, MD 02/27/16 1451

## 2016-02-27 NOTE — Discharge Instructions (Signed)
Continue your medications as before. ° °Return to the emergency department if your symptoms significantly worsen or change. °

## 2016-02-27 NOTE — ED Triage Notes (Signed)
Patient c/o mid chest pain intermittently since 0200 this AM. Patient states he has been taking Mylanta which helps some, but keeps returning so he decided to come to the ED. Patient states the chest pain radiates into  bilateral neck.  Patient c/o slight SOB with the CP.

## 2016-03-18 ENCOUNTER — Ambulatory Visit: Payer: BLUE CROSS/BLUE SHIELD | Admitting: Cardiology

## 2016-03-19 ENCOUNTER — Encounter: Payer: Self-pay | Admitting: Cardiology

## 2016-06-03 ENCOUNTER — Other Ambulatory Visit: Payer: Self-pay | Admitting: Physician Assistant

## 2016-06-24 ENCOUNTER — Emergency Department (HOSPITAL_COMMUNITY): Payer: BLUE CROSS/BLUE SHIELD

## 2016-06-24 ENCOUNTER — Emergency Department (HOSPITAL_COMMUNITY)
Admission: EM | Admit: 2016-06-24 | Discharge: 2016-06-24 | Disposition: A | Discharge status: Home / Self Care | Payer: BLUE CROSS/BLUE SHIELD | Attending: Emergency Medicine | Admitting: Emergency Medicine

## 2016-06-24 ENCOUNTER — Encounter (HOSPITAL_COMMUNITY): Payer: Self-pay | Admitting: Emergency Medicine

## 2016-06-24 DIAGNOSIS — Z7982 Long term (current) use of aspirin: Secondary | ICD-10-CM | POA: Insufficient documentation

## 2016-06-24 DIAGNOSIS — I2510 Atherosclerotic heart disease of native coronary artery without angina pectoris: Secondary | ICD-10-CM | POA: Insufficient documentation

## 2016-06-24 DIAGNOSIS — N182 Chronic kidney disease, stage 2 (mild): Secondary | ICD-10-CM | POA: Diagnosis not present

## 2016-06-24 DIAGNOSIS — R739 Hyperglycemia, unspecified: Secondary | ICD-10-CM

## 2016-06-24 DIAGNOSIS — R0602 Shortness of breath: Secondary | ICD-10-CM | POA: Diagnosis present

## 2016-06-24 DIAGNOSIS — E860 Dehydration: Secondary | ICD-10-CM | POA: Diagnosis not present

## 2016-06-24 DIAGNOSIS — Z8546 Personal history of malignant neoplasm of prostate: Secondary | ICD-10-CM | POA: Insufficient documentation

## 2016-06-24 DIAGNOSIS — E1165 Type 2 diabetes mellitus with hyperglycemia: Secondary | ICD-10-CM | POA: Insufficient documentation

## 2016-06-24 DIAGNOSIS — I252 Old myocardial infarction: Secondary | ICD-10-CM | POA: Diagnosis not present

## 2016-06-24 DIAGNOSIS — Z794 Long term (current) use of insulin: Secondary | ICD-10-CM | POA: Insufficient documentation

## 2016-06-24 DIAGNOSIS — I130 Hypertensive heart and chronic kidney disease with heart failure and stage 1 through stage 4 chronic kidney disease, or unspecified chronic kidney disease: Secondary | ICD-10-CM | POA: Diagnosis not present

## 2016-06-24 DIAGNOSIS — I5022 Chronic systolic (congestive) heart failure: Secondary | ICD-10-CM | POA: Insufficient documentation

## 2016-06-24 DIAGNOSIS — N179 Acute kidney failure, unspecified: Secondary | ICD-10-CM | POA: Insufficient documentation

## 2016-06-24 DIAGNOSIS — Z79899 Other long term (current) drug therapy: Secondary | ICD-10-CM | POA: Diagnosis not present

## 2016-06-24 HISTORY — DX: Unspecified asthma, uncomplicated: J45.909

## 2016-06-24 LAB — DIFFERENTIAL
Basophils Absolute: 0 10*3/uL (ref 0.0–0.1)
Basophils Relative: 1 %
Eosinophils Absolute: 0.1 10*3/uL (ref 0.0–0.7)
Eosinophils Relative: 1 %
Lymphocytes Relative: 39 %
Lymphs Abs: 3.3 10*3/uL (ref 0.7–4.0)
Monocytes Absolute: 0.5 10*3/uL (ref 0.1–1.0)
Monocytes Relative: 7 %
Neutro Abs: 4.3 10*3/uL (ref 1.7–7.7)
Neutrophils Relative %: 52 %

## 2016-06-24 LAB — URINALYSIS, ROUTINE W REFLEX MICROSCOPIC
Bacteria, UA: NONE SEEN
Bilirubin Urine: NEGATIVE
Glucose, UA: >=500 mg/dL — AB
Hgb urine dipstick: NEGATIVE
Ketones, ur: NEGATIVE mg/dL
Leukocytes, UA: NEGATIVE
Nitrite: NEGATIVE
Protein, ur: NEGATIVE mg/dL
Specific Gravity, Urine: 1.025 (ref 1.005–1.030)
Squamous Epithelial / LPF: NONE SEEN
pH: 5 (ref 5.0–8.0)

## 2016-06-24 LAB — BASIC METABOLIC PANEL
Anion gap: 12 (ref 5–15)
Anion gap: 7 (ref 5–15)
BUN: 30 mg/dL — ABNORMAL HIGH (ref 6–20)
BUN: 29 mg/dL — ABNORMAL HIGH (ref 6–20)
CO2: 21 mmol/L — ABNORMAL LOW (ref 22–32)
CO2: 26 mmol/L (ref 22–32)
Calcium: 9.9 mg/dL (ref 8.9–10.3)
Calcium: 9.1 mg/dL (ref 8.9–10.3)
Chloride: 99 mmol/L — ABNORMAL LOW (ref 101–111)
Chloride: 102 mmol/L (ref 101–111)
Creatinine, Ser: 2.43 mg/dL — ABNORMAL HIGH (ref 0.61–1.24)
Creatinine, Ser: 2.01 mg/dL — ABNORMAL HIGH (ref 0.61–1.24)
GFR calc Af Amer: 31 mL/min — ABNORMAL LOW (ref >60)
GFR calc Af Amer: 39 mL/min — ABNORMAL LOW (ref >60)
GFR calc non Af Amer: 27 mL/min — ABNORMAL LOW (ref >60)
GFR calc non Af Amer: 34 mL/min — ABNORMAL LOW (ref >60)
Glucose, Bld: 470 mg/dL — ABNORMAL HIGH (ref 65–99)
Glucose, Bld: 379 mg/dL — ABNORMAL HIGH (ref 65–99)
Potassium: 5.2 mmol/L — ABNORMAL HIGH (ref 3.5–5.1)
Potassium: 4.5 mmol/L (ref 3.5–5.1)
Sodium: 132 mmol/L — ABNORMAL LOW (ref 135–145)
Sodium: 135 mmol/L (ref 135–145)

## 2016-06-24 LAB — CBG MONITORING, ED: Glucose-Capillary: 481 mg/dL — ABNORMAL HIGH (ref 65–99)

## 2016-06-24 LAB — CBC
HCT: 42.2 % (ref 39.0–52.0)
Hemoglobin: 15 g/dL (ref 13.0–17.0)
MCH: 30 pg (ref 26.0–34.0)
MCHC: 35.5 g/dL (ref 30.0–36.0)
MCV: 84.4 fL (ref 78.0–100.0)
Platelets: 299 10*3/uL (ref 150–400)
RBC: 5 MIL/uL (ref 4.22–5.81)
RDW: 11.9 % (ref 11.5–15.5)
WBC: 8.3 10*3/uL (ref 4.0–10.5)

## 2016-06-24 LAB — I-STAT TROPONIN, ED
Troponin i, poc: 0.03 ng/mL (ref 0.00–0.08)
Troponin i, poc: 0.03 ng/mL (ref 0.00–0.08)

## 2016-06-24 LAB — BRAIN NATRIURETIC PEPTIDE: B Natriuretic Peptide: 22.8 pg/mL (ref 0.0–100.0)

## 2016-06-24 LAB — D-DIMER, QUANTITATIVE: D-Dimer, Quant: <0.27 ug/mL-FEU (ref 0.00–0.50)

## 2016-06-24 MED ORDER — ALBUTEROL SULFATE (2.5 MG/3ML) 0.083% IN NEBU: 5.0000 mg | INHALATION_SOLUTION | Freq: Once | RESPIRATORY_TRACT | Status: DC

## 2016-06-24 MED ORDER — SODIUM CHLORIDE 0.9 % IV BOLUS (SEPSIS)
1000.0000 mL | Freq: Once | INTRAVENOUS | Status: AC
  Administered 2016-06-24: 1000 mL via INTRAVENOUS

## 2016-06-24 NOTE — ED Provider Notes (Addendum)
[place] DEPT Provider Note   CSN: [id number] Arrival date & time: 06/24/16  1148     History   Chief Complaint Chief Complaint  Patient presents with  . Shortness of Breath    HPI [name] is a 61 y.o. male.  HPI 61 year old male who presents with shortness of breath. States that over the past 3-4 days he has been having dyspnea on exertion and fatigue. Denies any chest pain, lower extremity edema, orthopnea or PND. Does have history of diabetes and states that his blood sugars have been a little elevated recently, but he has been compliant with his home in tact glycemic. He also has a history of hypertension, hyperlipidemia, coronary artery disease, and CHF. No cough, fever or chills. No n/v/d, abdominal pain.   Past Medical History:  Diagnosis Date  . Asthma   . CAD (coronary artery disease)    a. non obst dz by cath 2004  b. 11/12/15 which showed no obvious large vessel CAD. No PCI or clear reason for NSTEMI ( possibly hypertensive urgency?).   [name] CARDIOMYOPATHY    a. NICM, LVEF 30% 01/2011 echo; 20-25% 07/2012 echo, EF recovered by echo 06/2015.  [name] DIABETES MELLITUS, TYPE II   . HYPERCHOLESTEROLEMIA   . HYPERTENSION   . Myocardial infarct ([place]) 10/2015  . Noncompliance   . Prostate cancer [facility])    prostate cancer  . PVCs (premature ventricular contractions)    a. s/p ablation 01-07-2013 by Dr Rayann Heman    Patient Active Problem List   Diagnosis Date Noted  . NSTEMI (non-ST elevated myocardial infarction) ([place]) 11/13/2015  . PVCs (premature ventricular contractions)   . CAD (coronary artery disease)   . Acute kidney injury ([place]) 07/19/2015  . Dyspnea 07/19/2015  . AKI (acute kidney injury) ([place]) 07/19/2015  . Hyperglycemia 07/12/2015  . Thigh pain 10/25/2014  . Prostate cancer ([place]) 02/22/2014  . Cholelithiasis and cholecystitis without obstruction 11/16/2013  . PVC's (premature ventricular contractions) 01/07/2013  . CKD (chronic kidney disease) stage  2, GFR 60-89 ml/min 08/25/2012  . OSA (obstructive sleep apnea) 08/25/2012  . Chronic systolic heart failure ([place]) 08/06/2012  . HTN (hypertension), malignant 02/12/2011  . Non compliance w medication regimen 02/12/2011  . HYPERCHOLESTEROLEMIA 09/17/2009  . PREMATURE VENTRICULAR CONTRACTIONS 09/17/2009  . PSA, INCREASED 04/26/2009  . Diabetes type 2, uncontrolled ([place]) 04/25/2009  . CARDIOMYOPATHY 04/25/2009    Past Surgical History:  Procedure Laterality Date  . ABLATION  01-07-2013   RVOT PVC's ablated by Dr Rayann Heman along anteroseptal RVOT  . CARDIAC CATHETERIZATION  2004   nonobst dz  . CARDIAC CATHETERIZATION N/A 11/12/2015   Procedure: Left Heart Cath and Coronary Angiography;  Surgeon: Sherren Mocha, MD;  Location: [place] CV LAB;  Service: Cardiovascular;  Laterality: N/A;  . CHOLECYSTECTOMY N/A 11/16/2013   Procedure: LAPAROSCOPIC CHOLECYSTECTOMY WITH INTRAOPERATIVE CHOLANGIOGRAM;  Surgeon: [name], MD;  Location: [facility] ORS;  Service: General;  Laterality: N/A;  . LYMPHADENECTOMY Bilateral 02/22/2014   Procedure: PELVIC LYMPH NODE DISSECTION;  Surgeon: [name], MD;  Location: [facility] ORS;  Service: Urology;  Laterality: Bilateral;  . ROBOT ASSISTED LAPAROSCOPIC RADICAL PROSTATECTOMY N/A 02/22/2014   Procedure: ROBOTIC ASSISTED LAPAROSCOPIC RADICAL PROSTATECTOMY WITH INDOCYANINE GREEN DYE AND OPEN UMBILICAL HERNIA REPAIR;  Surgeon: [name], MD;  Location: [facility] ORS;  Service: Urology;  Laterality: N/A;  . SUPRAVENTRICULAR TACHYCARDIA ABLATION N/A 09/28/2012   Procedure: Tanna Furry Ablation;  Surgeon: [name], MD;  Location: [facility] CATH LAB;  Service: Cardiovascular;  Laterality: N/A;  .  SURGERY SCROTAL / TESTICULAR     at age 54  . V-TACH ABLATION N/A 01/07/2013   Procedure: V-TACH ABLATION;  Surgeon: [name], MD;  Location: [place] CATH LAB;  Service: Cardiovascular;  Laterality: N/A;       Home Medications    Prior to Admission medications   Medication Sig  Start Date End Date Taking? Authorizing Provider  aspirin EC 81 MG EC tablet Take 1 tablet (81 mg total) by mouth daily. 11/14/15  Yes Eileen Stanford, PA-C  atorvastatin (LIPITOR) 80 MG tablet Take 1 tablet (80 mg total) by mouth daily at 6 PM. 11/13/15  Yes Eileen Stanford, PA-C  carvedilol (COREG) 12.5 MG tablet Take 1 tablet (12.5 mg total) by mouth 2 (two) times daily with a meal. 12/11/15  Yes Jerline Pain, MD  clopidogrel (PLAVIX) 75 MG tablet Take 1 tablet (75 mg total) by mouth daily. 11/13/15  Yes Eileen Stanford, PA-C  hydrochlorothiazide (HYDRODIURIL) 12.5 MG tablet Take 1 tablet (12.5 mg total) by mouth daily. 11/23/15  Yes Eileen Stanford, PA-C  insulin glargine (LANTUS) 100 unit/mL SOPN Inject 20 Units into the skin at bedtime.   Yes [provider]  isosorbide mononitrate (IMDUR) 60 MG 24 hr tablet Take 1 tablet (60 mg total) by mouth daily. 11/23/15  Yes Eileen Stanford, PA-C  lisinopril (PRINIVIL,ZESTRIL) 40 MG tablet TAKE ONE TABLET BY MOUTH ONCE DAILY 06/03/16  Yes Eileen Stanford, PA-C  omeprazole (PRILOSEC) 20 MG capsule Take 1 capsule (20 mg total) by mouth 2 (two) times daily before a meal. 01/26/16  Yes Virgel Manifold, MD    Family History Family History  Problem Relation Age of Onset  . Hypertension Mother   . Peripheral vascular disease Mother   . Peripheral vascular disease Unknown   . Hypertension Unknown   . Prostate cancer Unknown   . Prostate cancer Brother   . Heart attack Paternal Grandfather   . Heart failure Maternal Grandmother     Social History Social History  Substance Use Topics  . Smoking status: Never Smoker  . 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 Alcohol use No     Comment: rare     Allergies   Ambien [zolpidem tartrate] and Metformin and related   Review of Systems Review of Systems  Constitutional: Negative for fever.  HENT: Negative  for congestion.   Respiratory: Positive for shortness of breath. Negative for cough.   Cardiovascular: Negative for chest pain and leg swelling.  Gastrointestinal: Negative for abdominal pain.  Genitourinary: Negative for difficulty urinating.  Musculoskeletal: Negative for back pain.  Allergic/Immunologic: Negative for immunocompromised state.  Neurological: Positive for light-headedness. Negative for syncope.  Hematological: Does not bruise/bleed easily.  Psychiatric/Behavioral: Negative for confusion.  All other systems reviewed and are negative.    Physical Exam Updated Vital Signs BP 114/80   Pulse 72   Temp 97.4 F (36.3 C) (Oral)   Resp 20   Ht 5\' 5"  (1.651 m)   SpO2 100%   Physical Exam Physical Exam  Nursing note and vitals reviewed. Constitutional:  non-toxic, and in no acute distress Head: Normocephalic and atraumatic.  Mouth/Throat: Oropharynx is clear and dry mucous membranes.  Neck: Normal range of motion. Neck supple.  Cardiovascular: Normal rate and regular rhythm.   Pulmonary/Chest: Effort normal and breath sounds normal.  Abdominal: Soft. There is no tenderness. There is no rebound  and no guarding.  Musculoskeletal: Normal range of motion. no edema Neurological: Alert, no facial droop, fluent speech, moves all extremities symmetrically Skin: Skin is warm and dry.  Psychiatric: Cooperative    ED Treatments / Results  Labs (all labs ordered are listed, but only abnormal results are displayed) Labs Reviewed  BASIC METABOLIC PANEL - Abnormal; Notable for the following:       Result Value   Sodium 132 (*)    Potassium 5.2 (*)    Chloride 99 (*)    CO2 21 (*)    Glucose, Bld 470 (*)    BUN 30 (*)    Creatinine, Ser 2.43 (*)    GFR calc non Af Amer 27 (*)    GFR calc Af Amer 31 (*)    All other components within normal limits  URINALYSIS, ROUTINE W REFLEX MICROSCOPIC - Abnormal; Notable for the following:    Glucose, UA >=500 (*)    All other  components within normal limits  BASIC METABOLIC PANEL - Abnormal; Notable for the following:    Glucose, Bld 379 (*)    BUN 29 (*)    Creatinine, Ser 2.01 (*)    GFR calc non Af Amer 34 (*)    GFR calc Af Amer 39 (*)    All other components within normal limits  CBG MONITORING, ED - Abnormal; Notable for the following:    Glucose-Capillary 481 (*)    All other components within normal limits  CBC  BRAIN NATRIURETIC PEPTIDE  DIFFERENTIAL  D-DIMER, QUANTITATIVE (NOT AT [facility])  I-STAT TROPOININ, ED  I-STAT TROPOININ, ED    EKG  EKG Interpretation  Date/Time:  Tuesday Jun 24 2016 12:00:00 EDT Ventricular Rate:  73 PR Interval:    QRS Duration: 131 QT Interval:  396 QTC Calculation: 437 R Axis:   -47 Text Interpretation:  Sinus rhythm Left bundle branch block similar to prior EKG  Confirmed by Jeaneen Cala MD, Shinika Estelle [phone]) on 06/24/2016 12:07:20 PM       Radiology Dg Chest 2 View  Result Date: 06/24/2016 CLINICAL DATA:  Shortness of breath for 4 days, hypertension, type II diabetes mellitus, asthma, breast cancer, cardiomyopathy EXAM: CHEST  2 VIEW COMPARISON:  02/27/2016 FINDINGS: Normal heart size, mediastinal contours, and pulmonary vascularity. Lungs clear. No pleural effusion or pneumothorax. Bones unremarkable. IMPRESSION: Normal exam. Electronically Signed   By: Lavonia Myka Lukins M.D.   On: 06/24/2016 13:34    Procedures Procedures (including critical care time)  Medications Ordered in ED Medications  sodium chloride 0.9 % bolus 1,000 mL (0 mLs Intravenous Stopped 06/24/16 1512)     Initial Impression / Assessment and Plan / ED Course  I have reviewed the triage vital signs and the nursing notes.  Pertinent labs & imaging results that were available during my care of the patient were reviewed by me and considered in my medical decision making (see chart for details).     Records reviewed. LHC 10/2015 with mild non-obstructive CAD. EF 60-65% on ECHO 10/2015.  Presenting 2-3  days of fatigue with DOE. Is non-toxic. Soft BP, and looks dry on exam. Lungs clear.  Blood work with hyperglycemia, AKI, mildly decreased bicarbonate of 21. No AG or urine ketones. No DKA. However, presentation suggestive of dehydration. Given IVF, and he feels significantly improved. States he feels back to normal. Dyspnea and fatigue resolved. Ambulates in ED and feels back to normal without DOE.   ekg is non-ischemic. Serial troponins normal. Doubt acs at this time,  especially given minimal non-obstructive CAD on last LHC less than one year ago. Ddimer negative and low risk for PE. CXR visualized and shows no acute cardiopulmonary processes.   Given symptoms resolved, and he feels improved, he is felt stable for discharge home. Discussed increased fluids and PO intake. Discussed PCP f/u regarding glucose control and recheck kidney function in 1 week. Strict return and follow-up instructions reviewed. He expressed understanding of all discharge instructions and felt comfortable with the plan of care.   Final Clinical Impressions(s) / ED Diagnoses   Final diagnoses:  Dehydration  AKI (acute kidney injury) ([place])  Hyperglycemia    New Prescriptions New Prescriptions   No medications on file     Forde Dandy, MD 06/24/16 1701    Forde Dandy, MD 06/24/16 [phone]

## 2016-06-24 NOTE — ED Triage Notes (Signed)
Patient called male visitor and told her he was having SOB/trouble breathing. She said that took her about 20 ins or so to get to him to bring him here.  Patient states that he been having trouble breathing for about 3-4 days and just didn't want to listen to come get help until now.

## 2016-06-24 NOTE — ED Notes (Signed)
Pt ambulated over 64ft.   Pt stated that he felt better.  Pt showed no signs of distress.  Pt's vital signs O2 was 95, HR 88, and RR 20.

## 2016-06-24 NOTE — Discharge Instructions (Signed)
You were dehydrated today. Drink plenty of fluids and eat well at home. Please call your primary care doctor for follow-up. Your blood sugar is not very well controlled and please have them see if you need adjustments on your insulin. Please also have them recheck your kidney function in one week to make sure your dehydration is improving. Please return without fail for worsening symptoms, including fever, confusion, difficulty breathing, passing out, or any other symptoms concerning to you.

## 2016-10-03 ENCOUNTER — Other Ambulatory Visit: Payer: Self-pay

## 2016-10-03 MED ORDER — CARVEDILOL 12.5 MG PO TABS
12.5000 mg | ORAL_TABLET | Freq: Two times a day (BID) | ORAL | 0 refills | Status: DC
Start: 2016-10-03 — End: 2017-02-17

## 2016-10-13 ENCOUNTER — Ambulatory Visit: Payer: BLUE CROSS/BLUE SHIELD | Admitting: Nurse Practitioner

## 2016-10-13 ENCOUNTER — Emergency Department (HOSPITAL_COMMUNITY): Payer: BLUE CROSS/BLUE SHIELD

## 2016-10-13 ENCOUNTER — Telehealth: Payer: Self-pay | Admitting: Family

## 2016-10-13 ENCOUNTER — Encounter (HOSPITAL_COMMUNITY): Payer: Self-pay | Admitting: Emergency Medicine

## 2016-10-13 ENCOUNTER — Emergency Department (HOSPITAL_COMMUNITY)
Admission: EM | Admit: 2016-10-13 | Discharge: 2016-10-13 | Disposition: A | Discharge status: Home / Self Care | Payer: BLUE CROSS/BLUE SHIELD | Attending: Emergency Medicine | Admitting: Emergency Medicine

## 2016-10-13 DIAGNOSIS — Z8546 Personal history of malignant neoplasm of prostate: Secondary | ICD-10-CM | POA: Insufficient documentation

## 2016-10-13 DIAGNOSIS — Z7902 Long term (current) use of antithrombotics/antiplatelets: Secondary | ICD-10-CM | POA: Insufficient documentation

## 2016-10-13 DIAGNOSIS — I129 Hypertensive chronic kidney disease with stage 1 through stage 4 chronic kidney disease, or unspecified chronic kidney disease: Secondary | ICD-10-CM | POA: Insufficient documentation

## 2016-10-13 DIAGNOSIS — N182 Chronic kidney disease, stage 2 (mild): Secondary | ICD-10-CM | POA: Diagnosis not present

## 2016-10-13 DIAGNOSIS — Z7982 Long term (current) use of aspirin: Secondary | ICD-10-CM | POA: Diagnosis not present

## 2016-10-13 DIAGNOSIS — E1122 Type 2 diabetes mellitus with diabetic chronic kidney disease: Secondary | ICD-10-CM | POA: Insufficient documentation

## 2016-10-13 DIAGNOSIS — I252 Old myocardial infarction: Secondary | ICD-10-CM | POA: Insufficient documentation

## 2016-10-13 DIAGNOSIS — Z794 Long term (current) use of insulin: Secondary | ICD-10-CM | POA: Diagnosis not present

## 2016-10-13 DIAGNOSIS — I2510 Atherosclerotic heart disease of native coronary artery without angina pectoris: Secondary | ICD-10-CM | POA: Diagnosis not present

## 2016-10-13 DIAGNOSIS — J45909 Unspecified asthma, uncomplicated: Secondary | ICD-10-CM | POA: Diagnosis not present

## 2016-10-13 DIAGNOSIS — E1165 Type 2 diabetes mellitus with hyperglycemia: Secondary | ICD-10-CM | POA: Insufficient documentation

## 2016-10-13 DIAGNOSIS — Z79899 Other long term (current) drug therapy: Secondary | ICD-10-CM | POA: Insufficient documentation

## 2016-10-13 DIAGNOSIS — R739 Hyperglycemia, unspecified: Secondary | ICD-10-CM

## 2016-10-13 LAB — BASIC METABOLIC PANEL
Anion gap: 10 (ref 5–15)
BUN: 39 mg/dL — ABNORMAL HIGH (ref 6–20)
CO2: 24 mmol/L (ref 22–32)
Calcium: 9.2 mg/dL (ref 8.9–10.3)
Chloride: 96 mmol/L — ABNORMAL LOW (ref 101–111)
Creatinine, Ser: 2.24 mg/dL — ABNORMAL HIGH (ref 0.61–1.24)
GFR calc Af Amer: 34 mL/min — ABNORMAL LOW (ref >60)
GFR calc non Af Amer: 30 mL/min — ABNORMAL LOW (ref >60)
Glucose, Bld: 520 mg/dL (ref 65–99)
Potassium: 4.2 mmol/L (ref 3.5–5.1)
Sodium: 130 mmol/L — ABNORMAL LOW (ref 135–145)

## 2016-10-13 LAB — URINALYSIS, ROUTINE W REFLEX MICROSCOPIC
Bacteria, UA: NONE SEEN
Bilirubin Urine: NEGATIVE
Glucose, UA: >=500 mg/dL — AB
Hgb urine dipstick: NEGATIVE
Ketones, ur: NEGATIVE mg/dL
Leukocytes, UA: NEGATIVE
Nitrite: NEGATIVE
Protein, ur: NEGATIVE mg/dL
Specific Gravity, Urine: 1.022 (ref 1.005–1.030)
pH: 5 (ref 5.0–8.0)

## 2016-10-13 LAB — BRAIN NATRIURETIC PEPTIDE: B Natriuretic Peptide: 7.8 pg/mL (ref 0.0–100.0)

## 2016-10-13 LAB — CBC
HCT: 37.6 % — ABNORMAL LOW (ref 39.0–52.0)
Hemoglobin: 13.3 g/dL (ref 13.0–17.0)
MCH: 29.8 pg (ref 26.0–34.0)
MCHC: 35.4 g/dL (ref 30.0–36.0)
MCV: 84.3 fL (ref 78.0–100.0)
Platelets: 298 10*3/uL (ref 150–400)
RBC: 4.46 MIL/uL (ref 4.22–5.81)
RDW: 12.2 % (ref 11.5–15.5)
WBC: 8 10*3/uL (ref 4.0–10.5)

## 2016-10-13 LAB — I-STAT TROPONIN, ED: Troponin i, poc: 0.01 ng/mL (ref 0.00–0.08)

## 2016-10-13 LAB — CBG MONITORING, ED
Glucose-Capillary: 513 mg/dL (ref 65–99)
Glucose-Capillary: 324 mg/dL — ABNORMAL HIGH (ref 65–99)

## 2016-10-13 MED ORDER — SODIUM CHLORIDE 0.9 % IV BOLUS (SEPSIS)
1000.0000 mL | Freq: Once | INTRAVENOUS | Status: AC
  Administered 2016-10-13: 1000 mL via INTRAVENOUS

## 2016-10-13 NOTE — ED Provider Notes (Signed)
[facility] DEPT Provider Note   CSN: [id number] Arrival date & time: 10/13/16  1322     History   Chief Complaint Chief Complaint  Patient presents with  . Shortness of Breath  . Hyperglycemia    HPI [name] is a 61 y.o. male.  The history is provided by the patient.  Shortness of Breath  This is a recurrent problem. The average episode lasts 1 month. The problem occurs intermittently.The current episode started more than 1 week ago. The problem has not changed since onset.Pertinent negatives include no fever, no cough, no sputum production, no wheezing, no chest pain, no syncope, no vomiting, no abdominal pain, no leg pain and no leg swelling. Associated symptoms comments: Worsening with exertion. It is unknown what precipitated the problem. He has tried nothing for the symptoms. The treatment provided no relief. He has had prior hospitalizations. Associated medical issues comments: dm.  Hyperglycemia  Blood sugar level PTA:  >500 Severity:  Severe Onset quality:  Gradual Duration:  1 month Timing:  Constant Progression:  Worsening Chronicity:  Recurrent Diabetes status:  Controlled with insulin Context: recent change in diet   Context: not change in medication   Context comment:  Has been drinking regular soda Associated symptoms: shortness of breath   Associated symptoms: no abdominal pain, no chest pain, no fever, no syncope and no vomiting     Past Medical History:  Diagnosis Date  . Asthma   . CAD (coronary artery disease)    a. non obst dz by cath 2004  b. 11/12/15 which showed no obvious large vessel CAD. No PCI or clear reason for NSTEMI ( possibly hypertensive urgency?).   [name] CARDIOMYOPATHY    a. NICM, LVEF 30% 01/2011 echo; 20-25% 07/2012 echo, EF recovered by echo 06/2015.  [name] DIABETES MELLITUS, TYPE II   . HYPERCHOLESTEROLEMIA   . HYPERTENSION   . Myocardial infarct ([place]) 10/2015  . Noncompliance   . Prostate cancer [facility])    prostate cancer  .  PVCs (premature ventricular contractions)    a. s/p ablation 01-07-2013 by Dr Rayann Heman    Patient Active Problem List   Diagnosis Date Noted  . NSTEMI (non-ST elevated myocardial infarction) ([place]) 11/13/2015  . PVCs (premature ventricular contractions)   . CAD (coronary artery disease)   . Acute kidney injury ([place]) 07/19/2015  . Dyspnea 07/19/2015  . AKI (acute kidney injury) ([place]) 07/19/2015  . Hyperglycemia 07/12/2015  . Thigh pain 10/25/2014  . Prostate cancer ([place]) 02/22/2014  . Cholelithiasis and cholecystitis without obstruction 11/16/2013  . PVC's (premature ventricular contractions) 01/07/2013  . CKD (chronic kidney disease) stage 2, GFR 60-89 ml/min 08/25/2012  . OSA (obstructive sleep apnea) 08/25/2012  . Chronic systolic heart failure ([place]) 08/06/2012  . HTN (hypertension), malignant 02/12/2011  . Non compliance w medication regimen 02/12/2011  . HYPERCHOLESTEROLEMIA 09/17/2009  . PREMATURE VENTRICULAR CONTRACTIONS 09/17/2009  . PSA, INCREASED 04/26/2009  . Diabetes type 2, uncontrolled ([place]) 04/25/2009  . CARDIOMYOPATHY 04/25/2009    Past Surgical History:  Procedure Laterality Date  . ABLATION  01-07-2013   RVOT PVC's ablated by Dr Rayann Heman along anteroseptal RVOT  . CARDIAC CATHETERIZATION  2004   nonobst dz  . CARDIAC CATHETERIZATION N/A 11/12/2015   Procedure: Left Heart Cath and Coronary Angiography;  Surgeon: Sherren Mocha, MD;  Location: [facility] CV LAB;  Service: Cardiovascular;  Laterality: N/A;  . CHOLECYSTECTOMY N/A 11/16/2013   Procedure: LAPAROSCOPIC CHOLECYSTECTOMY WITH INTRAOPERATIVE CHOLANGIOGRAM;  Surgeon: [name], MD;  Location: [facility]  ORS;  Service: General;  Laterality: N/A;  . LYMPHADENECTOMY Bilateral 02/22/2014   Procedure: PELVIC LYMPH NODE DISSECTION;  Surgeon: [name], MD;  Location: [facility] ORS;  Service: Urology;  Laterality: Bilateral;  . ROBOT ASSISTED LAPAROSCOPIC RADICAL PROSTATECTOMY N/A 02/22/2014   Procedure: ROBOTIC  ASSISTED LAPAROSCOPIC RADICAL PROSTATECTOMY WITH INDOCYANINE GREEN DYE AND OPEN UMBILICAL HERNIA REPAIR;  Surgeon: [name], MD;  Location: [facility] ORS;  Service: Urology;  Laterality: N/A;  . SUPRAVENTRICULAR TACHYCARDIA ABLATION N/A 09/28/2012   Procedure: Tanna Furry Ablation;  Surgeon: [name], MD;  Location: [facility] CATH LAB;  Service: Cardiovascular;  Laterality: N/A;  . SURGERY SCROTAL / TESTICULAR     at age 50  . V-TACH ABLATION N/A 01/07/2013   Procedure: V-TACH ABLATION;  Surgeon: [name], MD;  Location: [facility] CATH LAB;  Service: Cardiovascular;  Laterality: N/A;       Home Medications    Prior to Admission medications   Medication Sig Start Date End Date Taking? Authorizing Provider  aspirin EC 81 MG EC tablet Take 1 tablet (81 mg total) by mouth daily. 11/14/15  Yes Eileen Stanford, PA-C  Aspirin-Salicylamide-Caffeine (BC HEADACHE POWDER PO) Take 1 Package by mouth daily as needed (headache).   Yes [provider]  atorvastatin (LIPITOR) 80 MG tablet Take 1 tablet (80 mg total) by mouth daily at 6 PM. 11/13/15  Yes Eileen Stanford, PA-C  carvedilol (COREG) 12.5 MG tablet Take 1 tablet (12.5 mg total) by mouth 2 (two) times daily with a meal. 10/03/16  Yes Jerline Pain, MD  clopidogrel (PLAVIX) 75 MG tablet Take 1 tablet (75 mg total) by mouth daily. 11/13/15  Yes Eileen Stanford, PA-C  hydrochlorothiazide (HYDRODIURIL) 12.5 MG tablet Take 1 tablet (12.5 mg total) by mouth daily. 11/23/15  Yes Eileen Stanford, PA-C  insulin glargine (LANTUS) 100 unit/mL SOPN Inject 20 Units into the skin at bedtime.   Yes [provider]  isosorbide mononitrate (IMDUR) 60 MG 24 hr tablet Take 1 tablet (60 mg total) by mouth daily. 11/23/15  Yes Eileen Stanford, PA-C  lisinopril (PRINIVIL,ZESTRIL) 40 MG tablet TAKE ONE TABLET BY MOUTH ONCE DAILY 06/03/16  Yes Eileen Stanford, PA-C  omeprazole (PRILOSEC) 20 MG capsule Take 1 capsule (20 mg total) by mouth 2  (two) times daily before a meal. 01/26/16  Yes Virgel Manifold, MD    Family History Family History  Problem Relation Age of Onset  . Hypertension Mother   . Peripheral vascular disease Mother   . Peripheral vascular disease Unknown   . Hypertension Unknown   . Prostate cancer Unknown   . Prostate cancer Brother   . Heart attack Paternal Grandfather   . Heart failure Maternal Grandmother     Social History Social History  Substance Use Topics  . Smoking status: Never Smoker  . 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 Alcohol use No     Comment: rare     Allergies   Ambien [zolpidem tartrate] and Metformin and related   Review of Systems Review of Systems  Constitutional: Negative for fever.  Respiratory: Positive for shortness of breath. Negative for cough, sputum production and wheezing.   Cardiovascular: Negative for chest pain, leg swelling and syncope.  Gastrointestinal: Negative for abdominal pain and vomiting.  All other systems reviewed and are negative.    Physical Exam Updated Vital Signs BP 102/83   Pulse 76  Temp 98 F (36.7 C) (Oral)   Resp 16   Ht 5\' 5"  (1.651 m)   Wt 77.1 kg (170 lb)   SpO2 96%   BMI 28.29 kg/m   Physical Exam  Constitutional: He is oriented to person, place, and time. He appears well-developed and well-nourished. No distress.  HENT:  Head: Normocephalic and atraumatic.  Mouth/Throat: Oropharynx is clear and moist.  Eyes: Pupils are equal, round, and reactive to light. Conjunctivae and EOM are normal.  Neck: Normal range of motion. Neck supple.  Cardiovascular: Normal rate, regular rhythm and intact distal pulses.   No murmur heard. Pulmonary/Chest: Effort normal and breath sounds normal. No respiratory distress. He has no wheezes. He has no rales.  Abdominal: Soft. He exhibits no distension. There is no tenderness. There is no rebound and no guarding.    Musculoskeletal: Normal range of motion. He exhibits no edema or tenderness.  Neurological: He is alert and oriented to person, place, and time.  Skin: Skin is warm and dry. No rash noted. No erythema.  Psychiatric: He has a normal mood and affect. His behavior is normal.  Nursing note and vitals reviewed.    ED Treatments / Results  Labs (all labs ordered are listed, but only abnormal results are displayed) Labs Reviewed  BASIC METABOLIC PANEL - Abnormal; Notable for the following:       Result Value   Sodium 130 (*)    Chloride 96 (*)    Glucose, Bld 520 (*)    BUN 39 (*)    Creatinine, Ser 2.24 (*)    GFR calc non Af Amer 30 (*)    GFR calc Af Amer 34 (*)    All other components within normal limits  CBC - Abnormal; Notable for the following:    HCT 37.6 (*)    All other components within normal limits  URINALYSIS, ROUTINE W REFLEX MICROSCOPIC - Abnormal; Notable for the following:    Glucose, UA >=500 (*)    Squamous Epithelial / LPF 0-5 (*)    All other components within normal limits  CBG MONITORING, ED - Abnormal; Notable for the following:    Glucose-Capillary 513 (*)    All other components within normal limits  CBG MONITORING, ED - Abnormal; Notable for the following:    Glucose-Capillary 324 (*)    All other components within normal limits  BRAIN NATRIURETIC PEPTIDE  I-STAT TROPONIN, ED    EKG  EKG Interpretation  Date/Time:  Monday October 13 2016 13:28:54 EDT Ventricular Rate:  81 PR Interval:    QRS Duration: 122 QT Interval:  387 QTC Calculation: 450 R Axis:   -59 Text Interpretation:  Sinus rhythm Left bundle branch block No significant change since last tracing Confirmed by Blanchie Dessert [phone]) on 10/13/2016 3:03:41 PM       Radiology Dg Chest 2 View  Result Date: 10/13/2016 CLINICAL DATA:  Chest pain and shortness of breath for 1 month. EXAM: CHEST  2 VIEW COMPARISON:  06/24/2016 FINDINGS: The cardiac silhouette, mediastinal and hilar  contours are within normal limits and stable. The lungs are clear. No pleural effusion. The bony thorax is intact. IMPRESSION: No acute cardiopulmonary findings Electronically Signed   By: Marijo Sanes M.D.   On: 10/13/2016 16:34    Procedures Procedures (including critical care time)  Medications Ordered in ED Medications  sodium chloride 0.9 % bolus 1,000 mL (not administered)     Initial Impression / Assessment and Plan / ED Course  I have reviewed the triage vital signs and the nursing notes.  Pertinent labs & imaging results that were available during my care of the patient were reviewed by me and considered in my medical decision making (see chart for details).    Patient is a 61 y/o male with a history of diabetes currently on 20 mg of Lantus at night presenting today with persistent high blood sugars critically high on his meter for the last month. He also notes he has had exertional dyspnea for some time. He denies any shortness of breath, leg swelling, pain or swelling in just one leg. Patient has no history of DVT or PE. He's had no recent surgeries.  He denies any infectious symptoms. He does complain of typical hyperglycemic symptoms such as intermittent blurry vision, polyuria, polydipsia. Patient was to see his doctor today however when he told them his monitor was reading high they instructed him to come to the emergency department. He is not having any new symptoms today than what he has had for the last month.  Patient's dyspnea on exertion may be related to his persistently elevated blood sugar. However he has had prior cardiac disease and CHF. He states the last time he went to his cardiologist they remarked that he no longer had evidence of CHF. He has no external signs concerning for CHF. Low suspicion for PE at this time. Low suspicion for MI or dissection. And does have a history of asthma but denies any excessive coughing, wheezing or other symptoms typical of his  asthma. Labs reveal hyperglycemia of 520 with mild worsening renal function of 2.2 from baseline of 2. CBC within normal limits. EKG without acute findings. BNP, troponin and chest x-ray pending. Patient given IV fluids for his hyperglycemia.  7:53 PM Labs are reassuring. After a liter of fluid patient's blood sugars in the 300s. Recommended that patient increase his nighttime Lantus to 30 units and call his doctor in the morning for further instruction on  Change or dose his insulin.  Final Clinical Impressions(s) / ED Diagnoses   Final diagnoses:  Hyperglycemia    New Prescriptions New Prescriptions   No medications on file     Blanchie Dessert, MD 10/13/16 [phone]

## 2016-10-13 NOTE — Telephone Encounter (Signed)
Patient Name: [name]  DOB: Oct 05, 1955    Initial Comment Caller states, father has been sick for a month, short of breath when walking small distances, Appt line trans caller to triage    Nurse Assessment  Nurse: Leilani Merl, RN, Nira Conn Date/Time (Eastern Time): 10/13/2016 12:50:51 PM  Confirm and document reason for call. If symptomatic, describe symptoms. ---Caller states that her dad has been SOB with exertion for the last month. Today he got SOB just when driving.  Does the patient have any new or worsening symptoms? ---Yes  Will a triage be completed? ---Yes  Related visit to physician within the last 2 weeks? ---No  Does the PT have any chronic conditions? (i.e. diabetes, asthma, etc.) ---Yes  List chronic conditions. ---See MR  Is this a behavioral health or substance abuse call? ---No     Guidelines    Guideline Title Affirmed Question Affirmed Notes  Breathing Difficulty Patient sounds very sick or weak to the triager    Final Disposition User   Go to ED Now (or PCP triage) Leilani Merl, RN, Heather    Referrals  GO TO FACILITY UNDECIDED   Disagree/Comply: Comply

## 2016-10-13 NOTE — ED Triage Notes (Signed)
Patient c/o SOB x 1 month. Patient states he has had blurred vision this AM. Patient states, "I started drinking dwater and my vision is better now.

## 2016-10-13 NOTE — ED Notes (Signed)
Discharge instructions reviewed with patient. Patient verbalizes understanding. VSS.   

## 2016-10-15 ENCOUNTER — Ambulatory Visit (INDEPENDENT_AMBULATORY_CARE_PROVIDER_SITE_OTHER): Payer: BLUE CROSS/BLUE SHIELD | Admitting: Family

## 2016-10-15 ENCOUNTER — Other Ambulatory Visit (INDEPENDENT_AMBULATORY_CARE_PROVIDER_SITE_OTHER): Payer: BLUE CROSS/BLUE SHIELD

## 2016-10-15 ENCOUNTER — Encounter: Payer: Self-pay | Admitting: Family

## 2016-10-15 VITALS — BP 108/70 | HR 74 | Resp 16 | Ht 65.0 in | Wt 164.0 lb

## 2016-10-15 DIAGNOSIS — E1100 Type 2 diabetes mellitus with hyperosmolarity without nonketotic hyperglycemic-hyperosmolar coma (NKHHC): Secondary | ICD-10-CM

## 2016-10-15 DIAGNOSIS — Z23 Encounter for immunization: Secondary | ICD-10-CM | POA: Diagnosis not present

## 2016-10-15 DIAGNOSIS — N481 Balanitis: Secondary | ICD-10-CM

## 2016-10-15 LAB — MICROALBUMIN / CREATININE URINE RATIO
Creatinine,U: 189 mg/dL
Microalb Creat Ratio: 0.5 mg/g (ref 0.0–30.0)
Microalb, Ur: 0.9 mg/dL (ref 0.0–1.9)

## 2016-10-15 LAB — RENAL FUNCTION PANEL
Albumin: 4 g/dL (ref 3.5–5.2)
BUN: 31 mg/dL — ABNORMAL HIGH (ref 6–23)
CO2: 26 mEq/L (ref 19–32)
Calcium: 9.6 mg/dL (ref 8.4–10.5)
Chloride: 101 mEq/L (ref 96–112)
Creatinine, Ser: 1.67 mg/dL — ABNORMAL HIGH (ref 0.40–1.50)
GFR: 53.8 mL/min — ABNORMAL LOW (ref >60.00)
Glucose, Bld: 271 mg/dL — ABNORMAL HIGH (ref 70–99)
Phosphorus: 2.8 mg/dL (ref 2.3–4.6)
Potassium: 3.9 mEq/L (ref 3.5–5.1)
Sodium: 134 mEq/L — ABNORMAL LOW (ref 135–145)

## 2016-10-15 LAB — HEMOGLOBIN A1C: Hgb A1c MFr Bld: 15.6 % — ABNORMAL HIGH (ref 4.6–6.5)

## 2016-10-15 MED ORDER — FLUCONAZOLE 100 MG PO TABS: 100.0000 mg | ORAL_TABLET | Freq: Every day | ORAL | 0 refills | Status: DC

## 2016-10-15 MED ORDER — CLOTRIMAZOLE 1 % EX CREA: 1.0000 "application " | TOPICAL_CREAM | Freq: Two times a day (BID) | CUTANEOUS | 0 refills | Status: DC

## 2016-10-15 MED ORDER — PIOGLITAZONE HCL 15 MG PO TABS: 15.0000 mg | ORAL_TABLET | Freq: Every day | ORAL | 2 refills | Status: DC

## 2016-10-15 MED ORDER — INSULIN GLARGINE 100 UNIT/ML SOLOSTAR PEN: 35.0000 [IU] | PEN_INJECTOR | Freq: Every day | SUBCUTANEOUS | 1 refills | Status: DC

## 2016-10-15 NOTE — Patient Instructions (Addendum)
Thank you for choosing [facility].  SUMMARY AND INSTRUCTIONS:  Please increase your Lantus to 35 units nightly.  Start taking Actos daily.  Decrease your carbohydrate intake of startches such as rice, pasta, and potatoes.   Continue to monitor your blood sugars at home at least twice daily.  Follow up in 3 weeks.   Start taking the fluconazole for the rash.  Apply the clotrimazole twice daily.   Medication:  Your prescription(s) have been submitted to your pharmacy or been printed and provided for you. Please take as directed and contact our office if you believe you are having problem(s) with the medication(s) or have any questions.  Labs:  Please stop by the lab on the lower level of the building for your blood work. Your results will be released to [facility] (or called to you) after review, usually within 72 hours after test completion. If any changes need to be made, you will be notified at that same time.  1.) The lab is open from 7:30am to 5:30 pm Monday-Friday 2.) No appointment is necessary 3.) Fasting (if needed) is 6-8 hours after food and drink; black coffee and water are okay   Follow up:  If your symptoms worsen or fail to improve, please contact our office for further instruction, or in case of emergency go directly to the emergency room at the closest medical facility.

## 2016-10-15 NOTE — Assessment & Plan Note (Addendum)
Type 2 diabetes poorly controlled with home readings greater than 500 and recently evaluated in the emergency department for hyperglycemia. Likely related to poor nutritional intake as he has been consuming a significant amount of carbohydrates. Question patient compliance with medication regimen as he has not had his medication filled by this provider and not been seen in about one year. Refill Lantus and increase to 35 units nightly. Start Actos. Previous 2-D echocardiogram reviewed and appeared normal. Obtain hemoglobin A1c and urine microalbumin. Diabetic foot exam completed today. Encouraged complete diabetic eye exam independently. Prescribed lisinopril and atorvastatin for CAD risk reduction. Monitor blood sugars at home at least twice daily and follow a low/modified carbohydrate intake. Follow-up in 3 weeks or sooner if needed.

## 2016-10-15 NOTE — Progress Notes (Signed)
Subjective:    Patient ID: [name], male    DOB: 1955/11/17, 61 y.o.   MRN: [id number]  Chief Complaint  Patient presents with  . Follow-up    refill on insulin, diabetes very uncontrolled sugars running very high, also penis issues that he wants to discuss in private     HPI:  LOYDE ORTH is a 61 y.o. male who  has a past medical history of Asthma; CAD (coronary artery disease); CARDIOMYOPATHY; DIABETES MELLITUS, TYPE II; HYPERCHOLESTEROLEMIA; HYPERTENSION; Myocardial infarct ([place]) (10/2015); Noncompliance; Prostate cancer ([place]); and PVCs (premature ventricular contractions). and presents today for a follow up office visit.  1.) Diabetes - Currently prescribed Lantus at 30 units. Reports taking the medication as prescribed and denies adverse side effects or hypoglycemic readings. Blood sugars have been elevated and was most recently seen in ED with increased dose of Lantus.  . Denies new symptoms of end organ damage. Does have increased thirst and hunger and increased blurred vision. Endorses a larger carbohydrate intake. No numbness or tingling. Currently eating about 3 meals per day with snacks in between.   Lab Results  Component Value Date   HGBA1C 8.2 (H) 11/11/2015     Lab Results  Component Value Date   CREATININE 2.24 (H) 10/13/2016   BUN 39 (H) 10/13/2016   NA 130 (L) 10/13/2016   K 4.2 10/13/2016   CL 96 (L) 10/13/2016   CO2 24 10/13/2016    2.) Rash - This is a new problem. Associated symptom of soreness/rash located around the foreskin of his penis has been going on for about 2 weeks. Modifying factors include antiseptic and cleansing with soap and water. Course of the symptoms has stayed about the same.   Allergies  Allergen Reactions  . Ambien [Zolpidem Tartrate] Other (See Comments)    Pt states that this medication made him go crazy.   . Metformin And Related Diarrhea      Outpatient Medications Prior to Visit  Medication Sig Dispense  Refill  . aspirin EC 81 MG EC tablet Take 1 tablet (81 mg total) by mouth daily.    . Aspirin-Salicylamide-Caffeine (BC HEADACHE POWDER PO) Take 1 Package by mouth daily as needed (headache).    [name] atorvastatin (LIPITOR) 80 MG tablet Take 1 tablet (80 mg total) by mouth daily at 6 PM. 90 tablet 3  . carvedilol (COREG) 12.5 MG tablet Take 1 tablet (12.5 mg total) by mouth 2 (two) times daily with a meal. 180 tablet 0  . clopidogrel (PLAVIX) 75 MG tablet Take 1 tablet (75 mg total) by mouth daily. 90 tablet 3  . hydrochlorothiazide (HYDRODIURIL) 12.5 MG tablet Take 1 tablet (12.5 mg total) by mouth daily. 90 tablet 3  . isosorbide mononitrate (IMDUR) 60 MG 24 hr tablet Take 1 tablet (60 mg total) by mouth daily. 90 tablet 3  . lisinopril (PRINIVIL,ZESTRIL) 40 MG tablet TAKE ONE TABLET BY MOUTH ONCE DAILY 90 tablet 1  . omeprazole (PRILOSEC) 20 MG capsule Take 1 capsule (20 mg total) by mouth 2 (two) times daily before a meal. 60 capsule 0  . insulin glargine (LANTUS) 100 unit/mL SOPN Inject 30 Units into the skin at bedtime.      No facility-administered medications prior to visit.       Past Surgical History:  Procedure Laterality Date  . ABLATION  01-07-2013   RVOT PVC's ablated by Dr Rayann Heman along anteroseptal RVOT  . CARDIAC CATHETERIZATION  2004   nonobst dz  .  CARDIAC CATHETERIZATION N/A 11/12/2015   Procedure: Left Heart Cath and Coronary Angiography;  Surgeon: Sherren Mocha, MD;  Location: [place] [facility];  Service: Cardiovascular;  Laterality: N/A;  . CHOLECYSTECTOMY N/A 11/16/2013   Procedure: LAPAROSCOPIC CHOLECYSTECTOMY WITH INTRAOPERATIVE CHOLANGIOGRAM;  Surgeon: [name], MD;  Location: [facility];  Service: General;  Laterality: N/A;  . LYMPHADENECTOMY Bilateral 02/22/2014   Procedure: PELVIC LYMPH NODE DISSECTION;  Surgeon: [name], MD;  Location: [facility];  Service: Urology;  Laterality: Bilateral;  . ROBOT ASSISTED LAPAROSCOPIC RADICAL PROSTATECTOMY N/A  02/22/2014   Procedure: ROBOTIC ASSISTED LAPAROSCOPIC RADICAL PROSTATECTOMY WITH INDOCYANINE GREEN DYE AND OPEN UMBILICAL HERNIA REPAIR;  Surgeon: [name], MD;  Location: [facility];  Service: Urology;  Laterality: N/A;  . SUPRAVENTRICULAR TACHYCARDIA ABLATION N/A 09/28/2012   Procedure: Tanna Furry Ablation;  Surgeon: [name], MD;  Location: [facility];  Service: Cardiovascular;  Laterality: N/A;  . SURGERY SCROTAL / TESTICULAR     at age 9  . V-TACH ABLATION N/A 01/07/2013   Procedure: V-TACH ABLATION;  Surgeon: [name], MD;  Location: [place] [facility];  Service: Cardiovascular;  Laterality: N/A;      Past Medical History:  Diagnosis Date  . Asthma   . CAD (coronary artery disease)    a. non obst dz by cath 2004  b. 11/12/15 which showed no obvious large vessel CAD. No PCI or clear reason for NSTEMI ( possibly hypertensive urgency?).   [name] CARDIOMYOPATHY    a. NICM, LVEF 30% 01/2011 echo; 20-25% 07/2012 echo, EF recovered by echo 06/2015.  [name] DIABETES MELLITUS, TYPE II   . HYPERCHOLESTEROLEMIA   . HYPERTENSION   . Myocardial infarct ([place]) 10/2015  . Noncompliance   . Prostate cancer [facility])    prostate cancer  . PVCs (premature ventricular contractions)    a. s/p ablation 01-07-2013 by Dr Rayann Heman      Review of Systems  Constitutional: Negative for chills and fever.  Eyes:       Negative for changes in vision  Respiratory: Negative for cough, chest tightness and wheezing.   Cardiovascular: Negative for chest pain, palpitations and leg swelling.  Neurological: Negative for dizziness, weakness and light-headedness.      Objective:    BP 108/70 (BP Location: Left Arm, Patient Position: Sitting, Cuff Size: Large)   Pulse 74   Resp 16   Ht 5\' 5"  (1.651 m)   Wt 164 lb (74.4 kg)   SpO2 95%   BMI 27.29 kg/m  Nursing note and vital signs reviewed.  Physical Exam  Constitutional: He is oriented to person, place, and time. He appears well-developed and well-nourished. No  distress.  Cardiovascular: Normal rate, regular rhythm, normal heart sounds and intact distal pulses.   Pulmonary/Chest: Effort normal and breath sounds normal.  Genitourinary: Testes normal. Uncircumcised. Penile tenderness present.  Genitourinary Comments: Red/white colored rash located around glans.   Neurological: He is alert and oriented to person, place, and time.  Skin: Skin is warm and dry.  Psychiatric: He has a normal mood and affect. His behavior is normal. Judgment and thought content normal.       Assessment & Plan:   Problem List Items Addressed This Visit      Endocrine   Diabetes type 2, uncontrolled ([place]) - Primary    Type 2 diabetes poorly controlled with home readings greater than 500 and recently evaluated in the emergency department for hyperglycemia. Likely related to poor nutritional intake as he has been consuming a  significant amount of carbohydrates. Question patient compliance with medication regimen as he has not had his medication filled by this provider and not been seen in about one year. Refill Lantus and increase to 35 units nightly. Start Actos. Previous 2-D echocardiogram reviewed and appeared normal. Obtain hemoglobin A1c and urine microalbumin. Diabetic foot exam completed today. Encouraged complete diabetic eye exam independently. Prescribed lisinopril and atorvastatin for CAD risk reduction. Monitor blood sugars at home at least twice daily and follow a low/modified carbohydrate intake. Follow-up in 3 weeks or sooner if needed.      Relevant Medications   pioglitazone (ACTOS) 15 MG tablet   Insulin Glargine (LANTUS) 100 UNIT/ML Solostar Pen   Other Relevant Orders   Hemoglobin A1c   Renal Function Panel   Urine Microalbumin w/creat. ratio     Genitourinary   Balanitis    Symptoms and exam consistent with balanitis most likely related to increased levels of blood sugar. Start fluconazole and Chlortrimazole. Encouraged good hygiene. Follow up if  symptoms worsen or do not improve.        Other Visit Diagnoses    Need for influenza vaccination       Relevant Orders   Flu Vaccine QUAD 36+ mos IM (Completed)       I have discontinued Mr. Toya insulin glargine. I am also having him start on pioglitazone, Insulin Glargine, clotrimazole, and fluconazole. Additionally, I am having him maintain his aspirin, atorvastatin, clopidogrel, hydrochlorothiazide, isosorbide mononitrate, omeprazole, lisinopril, carvedilol, and Aspirin-Salicylamide-Caffeine (BC HEADACHE POWDER PO).   Meds ordered this encounter  Medications  . pioglitazone (ACTOS) 15 MG tablet    Sig: Take 1 tablet (15 mg total) by mouth daily.    Dispense:  30 tablet    Refill:  2    Order Specific Question:   Supervising Provider    Answer:   Pricilla Holm A [[id number]]  . Insulin Glargine (LANTUS) 100 UNIT/ML Solostar Pen    Sig: Inject 35 Units into the skin daily at 10 pm.    Dispense:  15 mL    Refill:  1    Order Specific Question:   Supervising Provider    Answer:   Pricilla Holm A [[id number]]  . clotrimazole (LOTRIMIN) 1 % cream    Sig: Apply 1 application topically 2 (two) times daily.    Dispense:  30 g    Refill:  0    Order Specific Question:   Supervising Provider    Answer:   Pricilla Holm A [[id number]]  . fluconazole (DIFLUCAN) 100 MG tablet    Sig: Take 1 tablet (100 mg total) by mouth daily.    Dispense:  14 tablet    Refill:  0    Order Specific Question:   Supervising Provider    Answer:   Pricilla Holm A [[id number]]     Follow-up: Return in about 3 weeks (around 11/05/2016), or if symptoms worsen or fail to improve.  Mauricio Po, FNP

## 2016-10-15 NOTE — Assessment & Plan Note (Signed)
Symptoms and exam consistent with balanitis most likely related to increased levels of blood sugar. Start fluconazole and Chlortrimazole. Encouraged good hygiene. Follow up if symptoms worsen or do not improve.

## 2016-11-04 ENCOUNTER — Encounter: Payer: Self-pay | Admitting: Family

## 2016-11-04 ENCOUNTER — Ambulatory Visit (INDEPENDENT_AMBULATORY_CARE_PROVIDER_SITE_OTHER): Payer: BLUE CROSS/BLUE SHIELD | Admitting: Family

## 2016-11-04 VITALS — BP 126/88 | HR 87 | Temp 98.1°F | Resp 16 | Ht 65.0 in | Wt 174.0 lb

## 2016-11-04 DIAGNOSIS — E1165 Type 2 diabetes mellitus with hyperglycemia: Secondary | ICD-10-CM | POA: Diagnosis not present

## 2016-11-04 MED ORDER — INSULIN GLARGINE 100 UNIT/ML SOLOSTAR PEN: 40.0000 [IU] | PEN_INJECTOR | Freq: Every day | SUBCUTANEOUS | 1 refills | Status: DC

## 2016-11-04 MED ORDER — PIOGLITAZONE HCL 30 MG PO TABS
15.0000 mg | ORAL_TABLET | Freq: Every day | ORAL | 1 refills | Status: DC
Start: 2016-11-04 — End: 2017-01-14

## 2016-11-04 NOTE — Progress Notes (Signed)
Subjective:    Patient ID: [name], male    DOB: 08/12/1955, 61 y.o.   MRN: [id number]  Chief Complaint  Patient presents with  . Follow-up    Sugars has still be running high states the average is between 300-400s     HPI:  [name] is a 61 y.o. male who  has a past medical history of Asthma; CAD (coronary artery disease); CARDIOMYOPATHY; DIABETES MELLITUS, TYPE II; HYPERCHOLESTEROLEMIA; HYPERTENSION; Myocardial infarct ([place]) (10/2015); Noncompliance; Prostate cancer ([place]); and PVCs (premature ventricular contractions). and presents for a follow up office visit.   Type 2 diabetes - Currently prescribed Lantus and started on Actos. Reports taking the medication as prescribed and denies adverse side effects or hypoglycemic readings. Blood sugars at home have continued to remain elevated  . Denies new symptoms of end organ damage. Continues to experience excessive hunger. No thirst or urination. Working on a low/carbohydrate modified oral intake.    Lab Results  Component Value Date   HGBA1C 15.6 Repeated and verified X2. (H) 10/15/2016     Lab Results  Component Value Date   CREATININE 1.67 (H) 10/15/2016   BUN 31 (H) 10/15/2016   NA 134 (L) 10/15/2016   K 3.9 10/15/2016   CL 101 10/15/2016   CO2 26 10/15/2016    Allergies  Allergen Reactions  . Ambien [Zolpidem Tartrate] Other (See Comments)    Pt states that this medication made him go crazy.   . Metformin And Related Diarrhea      Outpatient Medications Prior to Visit  Medication Sig Dispense Refill  . aspirin EC 81 MG EC tablet Take 1 tablet (81 mg total) by mouth daily.    . Aspirin-Salicylamide-Caffeine (BC HEADACHE POWDER PO) Take 1 Package by mouth daily as needed (headache).    [name] atorvastatin (LIPITOR) 80 MG tablet Take 1 tablet (80 mg total) by mouth daily at 6 PM. 90 tablet 3  . carvedilol (COREG) 12.5 MG tablet Take 1 tablet (12.5 mg total) by mouth 2 (two) times daily with a meal. 180  tablet 0  . clopidogrel (PLAVIX) 75 MG tablet Take 1 tablet (75 mg total) by mouth daily. 90 tablet 3  . clotrimazole (LOTRIMIN) 1 % cream Apply 1 application topically 2 (two) times daily. 30 g 0  . fluconazole (DIFLUCAN) 100 MG tablet Take 1 tablet (100 mg total) by mouth daily. 14 tablet 0  . hydrochlorothiazide (HYDRODIURIL) 12.5 MG tablet Take 1 tablet (12.5 mg total) by mouth daily. 90 tablet 3  . isosorbide mononitrate (IMDUR) 60 MG 24 hr tablet Take 1 tablet (60 mg total) by mouth daily. 90 tablet 3  . lisinopril (PRINIVIL,ZESTRIL) 40 MG tablet TAKE ONE TABLET BY MOUTH ONCE DAILY 90 tablet 1  . omeprazole (PRILOSEC) 20 MG capsule Take 1 capsule (20 mg total) by mouth 2 (two) times daily before a meal. 60 capsule 0  . Insulin Glargine (LANTUS) 100 UNIT/ML Solostar Pen Inject 35 Units into the skin daily at 10 pm. 15 mL 1  . pioglitazone (ACTOS) 15 MG tablet Take 1 tablet (15 mg total) by mouth daily. 30 tablet 2   No facility-administered medications prior to visit.      Past Medical History:  Diagnosis Date  . Asthma   . CAD (coronary artery disease)    a. non obst dz by cath 2004  b. 11/12/15 which showed no obvious large vessel CAD. No PCI or clear reason for NSTEMI ( possibly hypertensive urgency?).   [name]  CARDIOMYOPATHY    a. NICM, LVEF 30% 01/2011 echo; 20-25% 07/2012 echo, EF recovered by echo 06/2015.  [name] DIABETES MELLITUS, TYPE II   . HYPERCHOLESTEROLEMIA   . HYPERTENSION   . Myocardial infarct ([place]) 10/2015  . Noncompliance   . Prostate cancer [facility])    prostate cancer  . PVCs (premature ventricular contractions)    a. s/p ablation 01-07-2013 by Dr [name]      Review of Systems  Eyes:       Negative for changes in vision.  Respiratory: Negative for chest tightness and shortness of breath.   Cardiovascular: Negative for chest pain, palpitations and leg swelling.  Endocrine: Negative for polydipsia, polyphagia and polyuria.  Neurological: Negative for dizziness,  weakness, light-headedness and headaches.      Objective:    BP 126/88 (BP Location: Left Arm, Patient Position: Sitting, Cuff Size: Large)   Pulse 87   Temp 98.1 F (36.7 C) (Oral)   Resp 16   Ht 5\' 5"  (1.651 m)   Wt 174 lb (78.9 kg)   SpO2 97%   BMI 28.96 kg/m  Nursing note and vital signs reviewed.  Physical Exam  Constitutional: He is oriented to person, place, and time. He appears well-developed and well-nourished. No distress.  Cardiovascular: Normal rate, regular rhythm, normal heart sounds and intact distal pulses.  Exam reveals no gallop and no friction rub.   No murmur heard. Pulmonary/Chest: Effort normal and breath sounds normal. No respiratory distress. He has no wheezes. He has no rales. He exhibits no tenderness.  Neurological: He is alert and oriented to person, place, and time.  Skin: Skin is warm and dry.  Psychiatric: He has a normal mood and affect. His behavior is normal. Judgment and thought content normal.       Assessment & Plan:   Problem List Items Addressed This Visit      Endocrine   Diabetes type 2, uncontrolled ([place]) - Primary    Type 2 diabetes remains poorly controlled but improved with medication regimen since previous visit. Continues to experience increased hunger. Increase Actos and Lantus. Awaiting referral to endocrinology. Reminded to complete diabetic eye exam independently when able. Follow up in 2 months or sooner pending endocrinology referral.       Relevant Medications   pioglitazone (ACTOS) 30 MG tablet   Insulin Glargine (LANTUS) 100 UNIT/ML Solostar Pen       I have changed [name] pioglitazone and Insulin Glargine. I am also having him maintain his aspirin, atorvastatin, clopidogrel, hydrochlorothiazide, isosorbide mononitrate, omeprazole, lisinopril, carvedilol, Aspirin-Salicylamide-Caffeine (BC HEADACHE POWDER PO), clotrimazole, and fluconazole.   Meds ordered this encounter  Medications  . pioglitazone (ACTOS)  30 MG tablet    Sig: Take 0.5 tablets (15 mg total) by mouth daily.    Dispense:  30 tablet    Refill:  1    Order Specific Question:   Supervising Provider    Answer:   Pricilla Holm A [[id number]]  . Insulin Glargine (LANTUS) 100 UNIT/ML Solostar Pen    Sig: Inject 40 Units into the skin daily at 10 pm.    Dispense:  15 mL    Refill:  1    Do not fill - DOSAGE CHANGE    Order Specific Question:   Supervising Provider    Answer:   Pricilla Holm A [[id number]]     Follow-up: Return in about 2 months (around 01/04/2017), or if symptoms worsen or fail to improve.  Mauricio Po, FNP

## 2016-11-04 NOTE — Patient Instructions (Addendum)
Thank you for choosing [facility].  SUMMARY AND INSTRUCTIONS:  Please continue to take your medications.   Increase your Lantus to 40 Units nightly.  Increase your Actos to 30 mg daily   Follow up in 2 months for A1c check.   Please check with [facility]  [phone]  Medication:  Your prescription(s) have been submitted to your pharmacy or been printed and provided for you. Please take as directed and contact our office if you believe you are having problem(s) with the medication(s) or have any questions.  Follow up:  If your symptoms worsen or fail to improve, please contact our office for further instruction, or in case of emergency go directly to the emergency room at the closest medical facility.

## 2016-11-04 NOTE — Assessment & Plan Note (Addendum)
Type 2 diabetes remains poorly controlled but improved with medication regimen since previous visit. Continues to experience increased hunger. Increase Actos and Lantus. Awaiting referral to endocrinology. Reminded to complete diabetic eye exam independently when able. Follow up in 2 months or sooner pending endocrinology referral.

## 2016-11-28 ENCOUNTER — Other Ambulatory Visit: Payer: Self-pay | Admitting: *Deleted

## 2016-11-28 MED ORDER — ATORVASTATIN CALCIUM 80 MG PO TABS: 80.0000 mg | ORAL_TABLET | Freq: Every day | ORAL | 0 refills | Status: DC

## 2016-12-09 ENCOUNTER — Encounter: Payer: Self-pay | Admitting: Endocrinology

## 2016-12-09 ENCOUNTER — Ambulatory Visit: Payer: BLUE CROSS/BLUE SHIELD | Admitting: Endocrinology

## 2016-12-09 VITALS — BP 148/99 | Wt 185.0 lb

## 2016-12-09 DIAGNOSIS — E1165 Type 2 diabetes mellitus with hyperglycemia: Secondary | ICD-10-CM | POA: Diagnosis not present

## 2016-12-09 LAB — POCT GLYCOSYLATED HEMOGLOBIN (HGB A1C): Hemoglobin A1C: 9.8

## 2016-12-09 MED ORDER — INSULIN GLARGINE 100 UNIT/ML SOLOSTAR PEN: 50.0000 [IU] | PEN_INJECTOR | SUBCUTANEOUS | 1 refills | Status: DC

## 2016-12-09 NOTE — Progress Notes (Signed)
Subjective:    Patient ID: [name], male    DOB: Oct 23, 1955, 61 y.o.   MRN: [id number]  HPI pt is referred by Terri Piedra, FNP, for diabetes.  Pt states DM was dx'ed in 2008; he has mild if any neuropathy of the lower extremities; he is unaware of any associated chronic complications; he has been on insulin since mid-2018; pt says his diet and exercise are not good; he has never had pancreatitis, pancreatic surgery, severe hypoglycemia or DKA.  He takes lantus 40/d, and pioglitizone.  He brings his meter, with his cbg's which I have reviewed today.  It varies from 150-250, except for a 94 on 1 occasion.  He does not recall why this was lower.  He checks at HS only Past Medical History:  Diagnosis Date  . Asthma   . CAD (coronary artery disease)    a. non obst dz by cath 2004  b. 11/12/15 which showed no obvious large vessel CAD. No PCI or clear reason for NSTEMI ( possibly hypertensive urgency?).   [name] CARDIOMYOPATHY    a. NICM, LVEF 30% 01/2011 echo; 20-25% 07/2012 echo, EF recovered by echo 06/2015.  [name] DIABETES MELLITUS, TYPE II   . HYPERCHOLESTEROLEMIA   . HYPERTENSION   . Myocardial infarct ([place]) 10/2015  . Noncompliance   . Prostate cancer [facility])    prostate cancer  . PVCs (premature ventricular contractions)    a. s/p ablation 01-07-2013 by Dr Rayann Heman    Past Surgical History:  Procedure Laterality Date  . ABLATION  01-07-2013   RVOT PVC's ablated by Dr Rayann Heman along anteroseptal RVOT  . CARDIAC CATHETERIZATION  2004   nonobst dz  . CARDIAC CATHETERIZATION N/A 11/12/2015   Procedure: Left Heart Cath and Coronary Angiography;  Surgeon: Sherren Mocha, MD;  Location: [facility];  Service: Cardiovascular;  Laterality: N/A;  . CHOLECYSTECTOMY N/A 11/16/2013   Procedure: LAPAROSCOPIC CHOLECYSTECTOMY WITH INTRAOPERATIVE CHOLANGIOGRAM;  Surgeon: [name], MD;  Location: [facility];  Service: General;  Laterality: N/A;  . LYMPHADENECTOMY Bilateral 02/22/2014   Procedure: PELVIC LYMPH NODE DISSECTION;  Surgeon: [name], MD;  Location: [facility];  Service: Urology;  Laterality: Bilateral;  . ROBOT ASSISTED LAPAROSCOPIC RADICAL PROSTATECTOMY N/A 02/22/2014   Procedure: ROBOTIC ASSISTED LAPAROSCOPIC RADICAL PROSTATECTOMY WITH INDOCYANINE GREEN DYE AND OPEN UMBILICAL HERNIA REPAIR;  Surgeon: [name], MD;  Location: [facility];  Service: Urology;  Laterality: N/A;  . SUPRAVENTRICULAR TACHYCARDIA ABLATION N/A 09/28/2012   Procedure: Tanna Furry Ablation;  Surgeon: [name], MD;  Location: [facility];  Service: Cardiovascular;  Laterality: N/A;  . SURGERY SCROTAL / TESTICULAR     at age 17  . V-TACH ABLATION N/A 01/07/2013   Procedure: V-TACH ABLATION;  Surgeon: [name], MD;  Location: [facility];  Service: Cardiovascular;  Laterality: N/A;    Social History   Socioeconomic History  . Marital status: Single    Spouse name: Not on file  . Number of children: Not on file  . Years of education: Not on file  . Highest education level: Not on file  Social Needs  . Financial resource strain: Not on file  . Food insecurity - worry: Not on file  . Food insecurity - inability: Not on file  . Transportation needs - medical: Not on file  . Transportation needs - non-medical: Not on file  Occupational History  . Occupation: Pension scheme manager: [employer]  Tobacco Use  . Smoking status:  Never Smoker  . Smokeless tobacco: Never Used  . 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 Alcohol use: No    Comment: rare  . Drug use: No  . Sexual activity: No  Other Topics Concern  . Not on file  Social History Narrative   Single - divorced x 3 -   Lives with 2 sons, enjoys spending time with 6 g-kids and 3 kids    Current Outpatient Medications on File Prior to Visit  Medication Sig Dispense Refill  . aspirin EC 81 MG EC tablet Take 1 tablet (81 mg total) by mouth  daily.    . Aspirin-Salicylamide-Caffeine (BC HEADACHE POWDER PO) Take 1 Package by mouth daily as needed (headache).    [name] atorvastatin (LIPITOR) 80 MG tablet Take 1 tablet (80 mg total) by mouth daily at 6 PM. Must keep Dec appt w/new provider for future refills 90 tablet 0  . carvedilol (COREG) 12.5 MG tablet Take 1 tablet (12.5 mg total) by mouth 2 (two) times daily with a meal. 180 tablet 0  . clopidogrel (PLAVIX) 75 MG tablet Take 1 tablet (75 mg total) by mouth daily. 90 tablet 3  . clotrimazole (LOTRIMIN) 1 % cream Apply 1 application topically 2 (two) times daily. 30 g 0  . fluconazole (DIFLUCAN) 100 MG tablet Take 1 tablet (100 mg total) by mouth daily. 14 tablet 0  . hydrochlorothiazide (HYDRODIURIL) 12.5 MG tablet Take 1 tablet (12.5 mg total) by mouth daily. 90 tablet 3  . isosorbide mononitrate (IMDUR) 60 MG 24 hr tablet Take 1 tablet (60 mg total) by mouth daily. 90 tablet 3  . lisinopril (PRINIVIL,ZESTRIL) 40 MG tablet TAKE ONE TABLET BY MOUTH ONCE DAILY 90 tablet 1  . omeprazole (PRILOSEC) 20 MG capsule Take 1 capsule (20 mg total) by mouth 2 (two) times daily before a meal. 60 capsule 0  . pioglitazone (ACTOS) 30 MG tablet Take 0.5 tablets (15 mg total) by mouth daily. 30 tablet 1   No current facility-administered medications on file prior to visit.     Allergies  Allergen Reactions  . Ambien [Zolpidem Tartrate] Other (See Comments)    Pt states that this medication made him go crazy.   . Metformin And Related Diarrhea    Family History  Problem Relation Age of Onset  . Hypertension Mother   . Peripheral vascular disease Mother   . Diabetes Mother   . Peripheral vascular disease Unknown   . Hypertension Unknown   . Prostate cancer Unknown   . Prostate cancer Brother   . Heart attack Paternal Grandfather   . Heart failure Maternal Grandmother   . Diabetes Son     BP (!) 148/99 (BP Location: Left Arm, Patient Position: Sitting, Cuff Size: Normal)   Wt 185 lb  (83.9 kg)   SpO2 97%   BMI 30.79 kg/m     Review of Systems denies weight loss, blurry vision, headache, chest pain, sob, n/v, urinary frequency, muscle cramps, excessive diaphoresis, memory loss, depression, cold intolerance, rhinorrhea, and easy bruising.       Objective:   Physical Exam VS: see vs page GEN: no distress HEAD: head: no deformity eyes: no periorbital swelling, no proptosis external nose and ears are normal mouth: no lesion seen NECK: supple, thyroid is not enlarged CHEST WALL: no deformity LUNGS: clear to auscultation CV: reg rate and rhythm, no murmur ABD: abdomen is soft, nontender.  no hepatosplenomegaly.  not distended.  no hernia MUSCULOSKELETAL: muscle bulk and strength are grossly normal.  no obvious joint swelling.  gait is normal and steady EXTEMITIES: no deformity.  no ulcer on the feet.  feet are of normal color and temp.  no edema.  There is bilateral onychomycosis of the toenails.  PULSES: dorsalis pedis intact bilat.  no carotid bruit NEURO:  cn 2-12 grossly intact.   readily moves all 4's.  sensation is intact to touch on the feet.  SKIN:  Normal texture and temperature.  No rash or suspicious lesion is visible.   NODES:  None palpable at the neck.   PSYCH: alert, well-oriented.  Does not appear anxious nor depressed.     Lab Results  Component Value Date   HGBA1C 9.8 12/09/2016   I personally reviewed electrocardiogram tracing (10/13/16).  Indication: severe hyperglycemia.  Impression: NSR.  No MI.  LBBB.  Compared to 06/24/16: no significant change.   I have reviewed outside records, and summarized: Pt was noted to have severely elevated a1c, and referred here.  He was seen in ER for cough, and cbg was noted to be severely elevated.      Assessment & Plan:  Insulin-requiring type 2 DM, new to me: severe exacerbation   Patient Instructions  good diet and exercise significantly improve the control of your diabetes.  please let me know  if you wish to be referred to a dietician.  high blood sugar is very risky to your health.  you should see an eye doctor and dentist every year.  It is very important to get all recommended vaccinations.  Controlling your blood pressure and cholesterol drastically reduces the damage diabetes does to your body.  Those who smoke should quit.  Please discuss these with your doctor.  check your blood sugar twice a day.  vary the time of day when you check, between before the 3 meals, and at bedtime.  also check if you have symptoms of your blood sugar being too high or too low.  please keep a record of the readings and bring it to your next appointment here (or you can bring the meter itself).  You can write it on any piece of paper.  please call us sooner if your blood sugar goes below 70, or if you have a lot of readings over 200.   We will need to take this complex situation in stages.   For now, please increase the insulin to 50 units each morning, and: Please continue the same pioglitizone.  On this type of insulin schedule, you should eat meals on a regular schedule.  If a meal is missed or significantly delayed, your blood sugar could go low.  Please call or message [place] next week, to tell us how the blood sugar is doing.   Please come back for a follow-up appointment in 1 month.

## 2016-12-09 NOTE — Patient Instructions (Addendum)
good diet and exercise significantly improve the control of your diabetes.  please let me know if you wish to be referred to a dietician.  high blood sugar is very risky to your health.  you should see an eye doctor and dentist every year.  It is very important to get all recommended vaccinations.  Controlling your blood pressure and cholesterol drastically reduces the damage diabetes does to your body.  Those who smoke should quit.  Please discuss these with your doctor.  check your blood sugar twice a day.  vary the time of day when you check, between before the 3 meals, and at bedtime.  also check if you have symptoms of your blood sugar being too high or too low.  please keep a record of the readings and bring it to your next appointment here (or you can bring the meter itself).  You can write it on any piece of paper.  please call us sooner if your blood sugar goes below 70, or if you have a lot of readings over 200.   We will need to take this complex situation in stages.   For now, please increase the insulin to 50 units each morning, and: Please continue the same pioglitizone.  On this type of insulin schedule, you should eat meals on a regular schedule.  If a meal is missed or significantly delayed, your blood sugar could go low.  Please call or message [place] next week, to tell us how the blood sugar is doing.   Please come back for a follow-up appointment in 1 month.

## 2016-12-10 ENCOUNTER — Other Ambulatory Visit: Payer: Self-pay | Admitting: Physician Assistant

## 2016-12-15 ENCOUNTER — Other Ambulatory Visit: Payer: Self-pay | Admitting: Physician Assistant

## 2016-12-15 MED ORDER — HYDROCHLOROTHIAZIDE 12.5 MG PO TABS: 12.5000 mg | ORAL_TABLET | Freq: Every day | ORAL | 0 refills | Status: DC

## 2016-12-15 MED ORDER — CLOPIDOGREL BISULFATE 75 MG PO TABS: 75.0000 mg | ORAL_TABLET | Freq: Every day | ORAL | 0 refills | Status: DC

## 2016-12-15 MED ORDER — ISOSORBIDE MONONITRATE ER 60 MG PO TB24: 60.0000 mg | ORAL_TABLET | Freq: Every day | ORAL | 0 refills | Status: DC

## 2016-12-15 MED ORDER — LISINOPRIL 40 MG PO TABS
40.0000 mg | ORAL_TABLET | Freq: Every day | ORAL | 0 refills | Status: DC
Start: 2016-12-15 — End: 2017-01-16

## 2016-12-15 NOTE — Telephone Encounter (Signed)
Pt's medications were sent to pt's pharmacy as requested. Confirmation received.  

## 2016-12-23 ENCOUNTER — Encounter: Payer: Self-pay | Admitting: Cardiology

## 2016-12-23 ENCOUNTER — Ambulatory Visit: Payer: BLUE CROSS/BLUE SHIELD | Admitting: Cardiology

## 2016-12-23 ENCOUNTER — Encounter (INDEPENDENT_AMBULATORY_CARE_PROVIDER_SITE_OTHER): Payer: Self-pay

## 2016-12-23 VITALS — BP 146/108 | HR 68 | Ht 65.0 in | Wt 186.8 lb

## 2016-12-23 DIAGNOSIS — I428 Other cardiomyopathies: Secondary | ICD-10-CM

## 2016-12-23 DIAGNOSIS — E1122 Type 2 diabetes mellitus with diabetic chronic kidney disease: Secondary | ICD-10-CM | POA: Diagnosis not present

## 2016-12-23 DIAGNOSIS — N182 Chronic kidney disease, stage 2 (mild): Secondary | ICD-10-CM

## 2016-12-23 DIAGNOSIS — I10 Essential (primary) hypertension: Secondary | ICD-10-CM | POA: Diagnosis not present

## 2016-12-23 DIAGNOSIS — Z794 Long term (current) use of insulin: Secondary | ICD-10-CM | POA: Diagnosis not present

## 2016-12-23 NOTE — Patient Instructions (Signed)
Medication Instructions:  The current medical regimen is effective;  continue present plan and medications.  Follow-Up: Follow up in 6 months with Laura Ingold, NP.  You will receive a letter in the mail 2 months before you are due.  Please call us when you receive this letter to schedule your follow up appointment.  Follow up in 1 year with Dr. Skains.  You will receive a letter in the mail 2 months before you are due.  Please call us when you receive this letter to schedule your follow up appointment.  If you need a refill on your cardiac medications before your next appointment, please call your pharmacy.  Thank you for choosing [facility]!!     

## 2016-12-23 NOTE — Progress Notes (Signed)
Cardiology Office Note:    Date:  12/23/2016   ID:  [name], DOB 1955/08/03, MRN [id number]  PCP:  Golden Circle, FNP  Cardiologist:  Candee Furbish, MD    Referring MD: Golden Circle, FNP     History of Present Illness:    [name] is a 61 y.o. male with a hx of with a history of HTN, PVC s/p RVOT ablation, chronic systolic HF, CKD stage III, h/o nonobstructive CAD, and borderline DM II who presented to [facility] on 11/11/15 with chest pain. He ruled in for NSTEMI and cardiology consulted.   His last cardiac cath in 2005 showed nonobstructive CAD with 25% D1 stenosis, 25% ramus stenosis. He had a 48-hour Holter monitor on 08/25/2012 that showed 39% monomorphic PVCs, he was referred to Dr. Rayann Heman. He was subsequently taken for PVC mapping followed by potential ablation, however he was not having enough PVCs to map, therefore no ablation was done. He underwent a second PVC ablation by Dr. Rayann Heman on 01/07/2013. He had 24-hour Holter monitor in January 2015 that showed decreased PVC burden. He had a history of nonischemic cardiomyopathy, his EF was 20-25% in July 2014. By February 2015, echocardiogram showed EF 40-45%. He was diagnosed with prostate cancer near the end of 2015 and early 2016 and underwent robotic prostatectomy on 02/24/2014.  He was last seen by Dr. Rayann Heman on 05/11/2015 at which times he was doing well after ablation, therefore no further EP workup was planned. He was admitted on 07/12/2015 for dyspnea and hyperglycemia with blood glucose greater than 700. During the hospitalization, he had mildly elevated troponin of 0.04 which stayed the same on repeat and was instructed to follow-up with cardiology as outpatient. The mildly elevated troponin was completely flat and consistent with demand ischemia. He was discharged on 6/16, however readmitted on 6/22 with dyspnea. He also had acute on chronic renal insufficiency which was suspected to be related to dehydration.  D-dimer was negative. Echo 07/19/2015 shows EF has improved to 55-60%, grade 1 DD.   He was seen in the office in 07/2015 for evaluation of dyspnea. Plan was for exercise myoview, which I don't see was ever completed.   He was seen in the [facility] ER on 11/10/15 with chest pain. He ruled out for MI and was discharged home. He returned on 11/11/15 with recurrent chest pain felt to be both atypical and typical. ECG with ischemic changes, especially in anterolateral leads.He was quite hypertensive. He was admitted for further work up. Initially, plan was for [facility], but he ruled in for MI with peak troponin ~14 and cardiology consulted. He was transferred to  [facility], admitted to cardiology service and set up for heart cath.   Interestingly, heart cath did not show any obstructive disease.  No PCI performed.  His blood pressure was improved and he was discharged.  12/23/16- he has been doing quite well.  He was frustrated at his elevated blood pressure today but normally it has been running in the good range.  No chest pain, no shortness of breath, no syncope.  Admits to medication compliance.  We discussed his previously reduced ejection fraction and improvement.    11/12/15 Left Heart Cath and Coronary Angiography  Conclusion     Mid RCA lesion, 30 %stenosed.  Mid LAD lesion, 25 %stenosed.  1st Diag lesion, 30 %stenosed.  There is hyperdynamic left ventricular systolic function.  The left ventricular ejection fraction is greater than 65% by visual estimate.  1.  Mild nonobstructive CAD as outlined, with mild plaque in the mid-RCA, mid-LAD, and first diagonal branch 2. Vigorous LV systolic function with normal LVEDP  No clear explanation for NSTEMI. Hypertensive emergency is a possibility. There is a tiny branch collateral possibly supplying the distal circumflex distribution from the RCA - it is possible this caused NSTEMI but the vessel is very small and occlusion is not visualized.      _____________  Echo: 07/19/2015 LV EF: 55% - 60% Study Conclusion - Left ventricle: The cavity size was mildly dilated. There was severe concentric hypertrophy. Systolic function was normal. The estimated ejection fraction was in the range of 55% to 60%. Wall motion was normal; there were no regional wall motion abnormalities. Doppler parameters are consistent with abnormal left ventricular relaxation (grade 1 diastolic dysfunction). - Left atrium: The atrium was mildly dilated      Past Medical History:  Diagnosis Date  . Asthma   . CAD (coronary artery disease)    a. non obst dz by cath 2004  b. 11/12/15 which showed no obvious large vessel CAD. No PCI or clear reason for NSTEMI ( possibly hypertensive urgency?).   [name] CARDIOMYOPATHY    a. NICM, LVEF 30% 01/2011 echo; 20-25% 07/2012 echo, EF recovered by echo 06/2015.  [name] DIABETES MELLITUS, TYPE II   . HYPERCHOLESTEROLEMIA   . HYPERTENSION   . Myocardial infarct ([place]) 10/2015  . Noncompliance   . Prostate cancer [facility])    prostate cancer  . PVCs (premature ventricular contractions)    a. s/p ablation 01-07-2013 by Dr Rayann Heman    Past Surgical History:  Procedure Laterality Date  . ABLATION  01-07-2013   RVOT PVC's ablated by Dr Rayann Heman along anteroseptal RVOT  . CARDIAC CATHETERIZATION  2004   nonobst dz  . CARDIAC CATHETERIZATION N/A 11/12/2015   Procedure: Left Heart Cath and Coronary Angiography;  Surgeon: Sherren Mocha, MD;  Location: [place] [facility];  Service: Cardiovascular;  Laterality: N/A;  . CHOLECYSTECTOMY N/A 11/16/2013   Procedure: LAPAROSCOPIC CHOLECYSTECTOMY WITH INTRAOPERATIVE CHOLANGIOGRAM;  Surgeon: [name], MD;  Location: [facility];  Service: General;  Laterality: N/A;  . LYMPHADENECTOMY Bilateral 02/22/2014   Procedure: PELVIC LYMPH NODE DISSECTION;  Surgeon: [name], MD;  Location: [facility];  Service: Urology;  Laterality: Bilateral;  . ROBOT ASSISTED LAPAROSCOPIC  RADICAL PROSTATECTOMY N/A 02/22/2014   Procedure: ROBOTIC ASSISTED LAPAROSCOPIC RADICAL PROSTATECTOMY WITH INDOCYANINE GREEN DYE AND OPEN UMBILICAL HERNIA REPAIR;  Surgeon: [name], MD;  Location: [facility];  Service: Urology;  Laterality: N/A;  . SUPRAVENTRICULAR TACHYCARDIA ABLATION N/A 09/28/2012   Procedure: Tanna Furry Ablation;  Surgeon: [name], MD;  Location: [facility] CATH LAB;  Service: Cardiovascular;  Laterality: N/A;  . SURGERY SCROTAL / TESTICULAR     at age 76  . V-TACH ABLATION N/A 01/07/2013   Procedure: V-TACH ABLATION;  Surgeon: [name], MD;  Location: [place] CATH LAB;  Service: Cardiovascular;  Laterality: N/A;    Current Medications: Current Meds  Medication Sig  . aspirin EC 81 MG EC tablet Take 1 tablet (81 mg total) by mouth daily.  . Aspirin-Salicylamide-Caffeine (BC HEADACHE POWDER PO) Take 1 Package by mouth daily as needed (headache).  [name] atorvastatin (LIPITOR) 80 MG tablet Take 1 tablet (80 mg total) by mouth daily at 6 PM. Must keep Dec appt w/new provider for future refills  . carvedilol (COREG) 12.5 MG tablet Take 1 tablet (12.5 mg total) by mouth 2 (two) times daily with a meal.  . clopidogrel (  PLAVIX) 75 MG tablet Take 1 tablet (75 mg total) daily by mouth. Please keep upcoming appt for future refills. Thanks  . hydrochlorothiazide (HYDRODIURIL) 12.5 MG tablet Take 1 tablet (12.5 mg total) daily by mouth. Please keep upcoming appt for future refills. Thanks  . Insulin Glargine (LANTUS) 100 UNIT/ML Solostar Pen Inject 50 Units every morning into the skin. And pen needles 2/day  . isosorbide mononitrate (IMDUR) 60 MG 24 hr tablet Take 1 tablet (60 mg total) daily by mouth. Please keep upcoming appt for future refills. Thanks  . lisinopril (PRINIVIL,ZESTRIL) 40 MG tablet Take 1 tablet (40 mg total) daily by mouth. Please keep upcoming appt for future refills. Thanks  . omeprazole (PRILOSEC) 20 MG capsule Take 1 capsule (20 mg total) by mouth 2 (two) times daily before  a meal.  . pioglitazone (ACTOS) 30 MG tablet Take 0.5 tablets (15 mg total) by mouth daily.  . [DISCONTINUED] clotrimazole (LOTRIMIN) 1 % cream Apply 1 application topically 2 (two) times daily.  . [DISCONTINUED] fluconazole (DIFLUCAN) 100 MG tablet Take 1 tablet (100 mg total) by mouth daily.     Allergies:   Ambien [zolpidem tartrate] and Metformin and related   Social History   Socioeconomic History  . Marital status: Single    Spouse name: None  . Number of children: None  . Years of education: None  . Highest education level: None  Social Needs  . Financial resource strain: None  . Food insecurity - worry: None  . Food insecurity - inability: None  . Transportation needs - medical: None  . Transportation needs - non-medical: None  Occupational History  . Occupation: Pension scheme manager: [employer]  Tobacco Use  . Smoking status: Never Smoker  . Smokeless tobacco: Never Used  . 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 Alcohol use: No    Comment: rare  . Drug use: No  . Sexual activity: No  Other Topics Concern  . None  Social History Narrative   Single - divorced x 3 -   Lives with 2 sons, enjoys spending time with 6 g-kids and 3 kids     Family History: The patient's family history includes Diabetes in his mother and son; Heart attack in his paternal grandfather; Heart failure in his maternal grandmother; Hypertension in his mother and unknown relative; Peripheral vascular disease in his mother and unknown relative; Prostate cancer in his brother and unknown relative. ROS:   Please see the history of present illness.     All other systems reviewed and are negative.  EKGs/Labs/Other Studies Reviewed:    The following studies were reviewed today: Cardiac catheterization, lab work, prior hospital notes reviewed  EKG:  No new ECG  Recent Labs: 10/13/2016: B Natriuretic Peptide 7.8;  Hemoglobin 13.3; Platelets 298 10/15/2016: BUN 31; Creatinine, Ser 1.67; Potassium 3.9; Sodium 134  Recent Lipid Panel    Component Value Date/Time   CHOL 177 11/12/2015 0631   TRIG 102 11/12/2015 0631   TRIG 200 09/20/2007   HDL 40 (L) 11/12/2015 0631   CHOLHDL 4.4 11/12/2015 0631   VLDL 20 11/12/2015 0631   LDLCALC 117 (H) 11/12/2015 0631   LDLDIRECT 148.8 04/25/2009 0000    Physical Exam:    VS:  BP (!) 146/108   Pulse 68   Ht 5\' 5"  (1.651 m)   Wt 186 lb 12.8 oz (84.7 kg)   SpO2 98%  BMI 31.09 kg/m     Wt Readings from Last 3 Encounters:  12/23/16 186 lb 12.8 oz (84.7 kg)  12/09/16 185 lb (83.9 kg)  11/04/16 174 lb (78.9 kg)     GEN:  Well nourished, well developed in no acute distress HEENT: Normal NECK: No JVD; No carotid bruits LYMPHATICS: No lymphadenopathy CARDIAC: RRR, no murmurs, rubs, gallops RESPIRATORY:  Clear to auscultation without rales, wheezing or rhonchi  ABDOMEN: Soft, non-tender, non-distended MUSCULOSKELETAL:  No edema; No deformity  SKIN: Warm and dry NEUROLOGIC:  Alert and oriented x 3 PSYCHIATRIC:  Normal affect   ASSESSMENT:    1. NICM (nonischemic cardiomyopathy) ([place]) with improved EF   2. HTN (hypertension), malignant   3. Type 2 diabetes mellitus with stage 2 chronic kidney disease, with long-term current use of insulin (HCC)    PLAN:    In order of problems listed above:  History of non-ST elevation myocardial infarction with troponin of 14 in October 2017.  Interestingly no large vessel CAD.  EF was hyperdynamic.  He did have a Spaete shaped apical hypertrophy.  No PCI.  Perhaps this significant troponin elevation was secondary to hypertensive urgency but this is hard to tell and usually out of proportion.  I think I will continue with his Plavix for now.  Nonischemic cardiomyopathy with prior history of systolic and diastolic heart failure. -His blood pressure at one point was severely elevated and his EF was 20% in 2013.  Most  recent echocardiogram demonstrates normal function.  He is frustrated by his elevated blood pressure.  At last office visit with Dr. Elna Breslow on 11/04/16 his blood pressure was excellent at 126/88. -Most recent ejection fraction is excellent.  Resolution with good blood pressure control.  Diabetes with hypertension - Per Dr. Elna Breslow.  Last hemoglobin A1c 9.8.  Chronic kidney disease stage III -Creatinine 1.6.  Continue to monitor.  Avoid NSAIDs.  Hyperlipidemia - With LDL 117 back during hospitalization a year ago.  Continue with atorvastatin 80.  If it has not been repeated, recheck at next visit.   Medication Adjustments/Labs and Tests Ordered: Current medicines are reviewed at length with the patient today.  Concerns regarding medicines are outlined above.  No orders of the defined types were placed in this encounter.  No orders of the defined types were placed in this encounter.   Signed, Candee Furbish, MD  12/23/2016 12:08 PM    [place]

## 2017-01-05 ENCOUNTER — Ambulatory Visit: Payer: BLUE CROSS/BLUE SHIELD | Admitting: Nurse Practitioner

## 2017-01-08 ENCOUNTER — Encounter: Payer: Self-pay | Admitting: Nurse Practitioner

## 2017-01-08 ENCOUNTER — Ambulatory Visit: Payer: BLUE CROSS/BLUE SHIELD | Admitting: Nurse Practitioner

## 2017-01-08 VITALS — BP 132/86 | HR 78 | Temp 97.9°F | Resp 16 | Ht 65.0 in | Wt 192.0 lb

## 2017-01-08 DIAGNOSIS — M5441 Lumbago with sciatica, right side: Secondary | ICD-10-CM | POA: Diagnosis not present

## 2017-01-08 DIAGNOSIS — E1165 Type 2 diabetes mellitus with hyperglycemia: Secondary | ICD-10-CM | POA: Diagnosis not present

## 2017-01-08 NOTE — Assessment & Plan Note (Signed)
Recently established with endocrinology. Reports improving glucose readings at home. We discussed improving his diet and exercise, including working towards a goal of 150 minutes of exercise each week. He declines referral to diabetes nutrition. He plans to start walking at least 2 days a week. He will continue to follow with endocrinology. - Ambulatory referral to Ophthalmology- for annual diabetic eye exam

## 2017-01-08 NOTE — Progress Notes (Signed)
Subjective:    Patient ID: [name], male    DOB: [birth year], 61 y.o.   MRN: [id number]  HPI [name] is a 61 yo male who presents today to establish care. He is transferring to me from another provider in the same clinic. He is coming in today for a diabetes follow up. He has a significant medical history of NSTEMI, nonischemic cardiomyopathy, diabetes, hypertension, chronic kidney disease stage III, hyperlipidemia, CAD, OSA, prostate cancer. He is followed by cardiology for history of NSTEMI and nonischemic cardiomyopathy.   He was referred to endocrinology by his prior primary provider after continued elevated blood sugar readings 300-400 despite daily lantus and actos. At his initial endocrinology appointment 1 month ago he was instructed to work on good diet and exercise, continue actos, and increase his lantus dosage. He says he has been compliant with the mediation changes but has not really made many dietary changes or exercised. He finds it difficult to find the time to exercise. He does check blood sugars almost every night before bedtime - readings 120s over past few weeks, which have improved since increasing his lantus dose. He plans to return to endocrinology next week for follow up.  Lab Results  Component Value Date   HGBA1C 9.8 12/09/2016   Back pain- This is a new problem, this problem began several months ago. The pain is a dull ache that shoots down his right leg. He has the pain almost daily. The pain is worse when he wakes in the morning. He wonders if his mattress is making his back hurt. He has been taking OTC goody powder with some relief. He denies weakness, numbness, loss of bowel or bladder control, nausea, vomiting, urinary frequency, dysuria, hematuria.  Review of Systems  See HPI   Past Medical History:  Diagnosis Date  . Asthma   . CAD (coronary artery disease)    a. non obst dz by cath 2004  b. 11/12/15 which showed no obvious large vessel CAD. No  PCI or clear reason for NSTEMI ( possibly hypertensive urgency?).   [name] CARDIOMYOPATHY    a. NICM, LVEF 30% 01/2011 echo; 20-25% 07/2012 echo, EF recovered by echo 06/2015.  [name] DIABETES MELLITUS, TYPE II   . HYPERCHOLESTEROLEMIA   . HYPERTENSION   . Myocardial infarct ([place]) 10/2015  . Noncompliance   . Prostate cancer [facility])    prostate cancer  . PVCs (premature ventricular contractions)    a. s/p ablation 01-07-2013 by Dr Rayann Heman     Social History   Socioeconomic History  . Marital status: Single    Spouse name: Not on file  . Number of children: Not on file  . Years of education: Not on file  . Highest education level: Not on file  Social Needs  . Financial resource strain: Not on file  . Food insecurity - worry: Not on file  . Food insecurity - inability: Not on file  . Transportation needs - medical: Not on file  . Transportation needs - non-medical: Not on file  Occupational History  . Occupation: Pension scheme manager: [employer]  Tobacco Use  . Smoking status: Never Smoker  . Smokeless tobacco: Never Used  . 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 Alcohol use: No    Comment: rare  . Drug use: No  . Sexual activity: No  Other Topics Concern  . Not on file  Social History Narrative   Single - divorced x 3 -   Lives with 2 sons, enjoys spending time with 6 g-kids and 3 kids    Past Surgical History:  Procedure Laterality Date  . ABLATION  01-07-2013   RVOT PVC's ablated by Dr Rayann Heman along anteroseptal RVOT  . CARDIAC CATHETERIZATION  2004   nonobst dz  . CARDIAC CATHETERIZATION N/A 11/12/2015   Procedure: Left Heart Cath and Coronary Angiography;  Surgeon: Sherren Mocha, MD;  Location: [place] [facility] LAB;  Service: Cardiovascular;  Laterality: N/A;  . CHOLECYSTECTOMY N/A 11/16/2013   Procedure: LAPAROSCOPIC CHOLECYSTECTOMY WITH INTRAOPERATIVE CHOLANGIOGRAM;  Surgeon: [name], MD;  Location: [facility];  Service: General;  Laterality: N/A;  . LYMPHADENECTOMY Bilateral 02/22/2014   Procedure: PELVIC LYMPH NODE DISSECTION;  Surgeon: [name], MD;  [facility]: [facility];  Service: Urology;  Laterality: Bilateral;  . ROBOT ASSISTED LAPAROSCOPIC RADICAL PROSTATECTOMY N/A 02/22/2014   Procedure: ROBOTIC ASSISTED LAPAROSCOPIC RADICAL PROSTATECTOMY WITH INDOCYANINE GREEN DYE AND OPEN UMBILICAL HERNIA REPAIR;  Surgeon: [name], MD;  Location[facility];  Service: Urology;  Laterality: N/A;  . SUPRAVENTRICULAR TACHYCARDIA ABLATION N/A 09/28/2012   Procedure: Tanna Furry Ablation;  Surgeon: [name], MD;  Location: [facility] CATH LAB;  Service: Cardiovascular;  Laterality: N/A;  . SURGERY SCROTAL / TESTICULAR     at age 77  . V-TACH ABLATION N/A 01/07/2013   Procedure: V-TACH ABLATION;  Surgeon: [name], MD;  Location: [place] CATH LAB;  Service: Cardiovascular;  Laterality: N/A;    Family History  Problem Relation Age of Onset  . Hypertension Mother   . Peripheral vascular disease Mother   . Diabetes Mother   . Peripheral vascular disease Unknown   . Hypertension Unknown   . Prostate cancer Unknown   . Prostate cancer Brother   . Heart attack Paternal Grandfather   . Heart failure Maternal Grandmother   . Diabetes Son     Allergies  Allergen Reactions  . Ambien [Zolpidem Tartrate] Other (See Comments)    Pt states that this medication made him go crazy.   . Metformin And Related Diarrhea    Current Outpatient Medications on File Prior to Visit  Medication Sig Dispense Refill  . aspirin EC 81 MG EC tablet Take 1 tablet (81 mg total) by mouth daily.    . Aspirin-Salicylamide-Caffeine (BC HEADACHE POWDER PO) Take 1 Package by mouth daily as needed (headache).    [name] atorvastatin (LIPITOR) 80 MG tablet Take 1 tablet (80 mg total) by mouth daily at 6 PM. Must keep Dec appt w/new provider for future refills 90 tablet 0  . carvedilol (COREG) 12.5 MG tablet Take 1  tablet (12.5 mg total) by mouth 2 (two) times daily with a meal. 180 tablet 0  . clopidogrel (PLAVIX) 75 MG tablet Take 1 tablet (75 mg total) daily by mouth. Please keep upcoming appt for future refills. Thanks 30 tablet 0  . hydrochlorothiazide (HYDRODIURIL) 12.5 MG tablet Take 1 tablet (12.5 mg total) daily by mouth. Please keep upcoming appt for future refills. Thanks 30 tablet 0  . Insulin Glargine (LANTUS) 100 UNIT/ML Solostar Pen Inject 50 Units every morning into the skin. And pen needles 2/day 30 mL 1  . isosorbide mononitrate (IMDUR) 60 MG 24 hr tablet Take 1 tablet (60 mg total) daily by mouth. Please keep upcoming appt for future refills. Thanks 30 tablet 0  . lisinopril (PRINIVIL,ZESTRIL) 40 MG tablet Take 1 tablet (40 mg total) daily by mouth. Please  keep upcoming appt for future refills. Thanks 30 tablet 0  . omeprazole (PRILOSEC) 20 MG capsule Take 1 capsule (20 mg total) by mouth 2 (two) times daily before a meal. 60 capsule 0  . pioglitazone (ACTOS) 30 MG tablet Take 0.5 tablets (15 mg total) by mouth daily. 30 tablet 1   No current facility-administered medications on file prior to visit.     BP 132/86 (BP Location: Left Arm, Patient Position: Sitting, Cuff Size: Large)   Pulse 78   Temp 97.9 F (36.6 C) (Oral)   Resp 16   Ht 5\' 5"  (1.651 m)   Wt 192 lb (87.1 kg)   SpO2 98%   BMI 31.95 kg/m      Objective:   Physical Exam  Constitutional: He is oriented to person, place, and time. He appears well-developed and well-nourished. No distress.  HENT:  Head: Normocephalic and atraumatic.  Cardiovascular: Normal rate, regular rhythm, normal heart sounds and intact distal pulses.  Pulmonary/Chest: Effort normal and breath sounds normal. No respiratory distress.  Musculoskeletal: Normal range of motion. He exhibits no edema, tenderness or deformity.       Lumbar back: He exhibits pain. He exhibits normal range of motion, no tenderness and no swelling.  Negative straight leg  raise  Neurological: He is alert and oriented to person, place, and time. Coordination normal.  Skin: Skin is warm and dry.  Psychiatric: He has a normal mood and affect. Judgment and thought content normal.       Assessment & Plan:   Acute bilateral low back pain with right-sided sciatica No alarming findings on H&P. Declines xray, referral to PT. We discussed lifestyle measures to improve LBP including proper mattress, daily activity. Lower back exercises given with instructions to complete daily. He will let me know if no improvement.

## 2017-01-08 NOTE — Patient Instructions (Addendum)
Return at your convenience for an annual physical.  For your back pain, I have attached exercises that I would like for you to complete every day, along with trying to increase your overall activity. If you feel like your mattress makes your back hurt, it may be time to change it. If your back pain continues or worsens, let me know and we can consider an xray and/or referral to physical therapy.  Keep up the good work with lowering your blood sugar!  It was nice to meet you. Thanks for letting me take care of you today :)    Back Pain, Adult Back pain is very common. The pain often gets better over time. The cause of back pain is usually not dangerous. Most people can learn to manage their back pain on their own. Follow these instructions at home: Watch your back pain for any changes. The following actions may help to lessen any pain you are feeling:  Stay active. Start with short walks on flat ground if you can. Try to walk farther each day.  Exercise regularly as told by your doctor. Exercise helps your back heal faster. It also helps avoid future injury by keeping your muscles strong and flexible.  Do not sit, drive, or stand in one place for more than 30 minutes.  Do not stay in bed. Resting more than 1-2 days can slow down your recovery.  Be careful when you bend or lift an object. Use good form when lifting: ? Bend at your knees. ? Keep the object close to your body. ? Do not twist.  Sleep on a firm mattress. Lie on your side, and bend your knees. If you lie on your back, put a pillow under your knees.  Take medicines only as told by your doctor.  Put ice on the injured area. ? Put ice in a plastic bag. ? Place a towel between your skin and the bag. ? Leave the ice on for 20 minutes, 2-3 times a day for the first 2-3 days. After that, you can switch between ice and heat packs.  Avoid feeling anxious or stressed. Find good ways to deal with stress, such as  exercise.  Maintain a healthy weight. Extra weight puts stress on your back.  Contact a doctor if:  You have pain that does not go away with rest or medicine.  You have worsening pain that goes down into your legs or buttocks.  You have pain that does not get better in one week.  You have pain at night.  You lose weight.  You have a fever or chills. Get help right away if:  You cannot control when you poop (bowel movement) or pee (urinate).  Your arms or legs feel weak.  Your arms or legs lose feeling (numbness).  You feel sick to your stomach (nauseous) or throw up (vomit).  You have belly (abdominal) pain.  You feel like you may pass out (faint). This information is not intended to replace advice given to you by your health care provider. Make sure you discuss any questions you have with your health care provider. Document Released: 07/02/2007 Document Revised: 06/21/2015 Document Reviewed: 05/17/2013 Elsevier Interactive Patient Education  2018 [employer].   Back Exercises If you have pain in your back, do these exercises 2-3 times each day or as told by your doctor. When the pain goes away, do the exercises once each day, but repeat the steps more times for each exercise (do more repetitions).  If you do not have pain in your back, do these exercises once each day or as told by your doctor. Exercises Single Knee to Chest  Do these steps 3-5 times in a row for each leg: 1. Lie on your back on a firm bed or the floor with your legs stretched out. 2. Bring one knee to your chest. 3. Hold your knee to your chest by grabbing your knee or thigh. 4. Pull on your knee until you feel a gentle stretch in your lower back. 5. Keep doing the stretch for 10-30 seconds. 6. Slowly let go of your leg and straighten it.  Pelvic Tilt  Do these steps 5-10 times in a row: 1. Lie on your back on a firm bed or the floor with your legs stretched out. 2. Bend your knees so they point  up to the ceiling. Your feet should be flat on the floor. 3. Tighten your lower belly (abdomen) muscles to press your lower back against the floor. This will make your tailbone point up to the ceiling instead of pointing down to your feet or the floor. 4. Stay in this position for 5-10 seconds while you gently tighten your muscles and breathe evenly.  Cat-Cow  Do these steps until your lower back bends more easily: 1. Get on your hands and knees on a firm surface. Keep your hands under your shoulders, and keep your knees under your hips. You may put padding under your knees. 2. Let your head hang down, and make your tailbone point down to the floor so your lower back is round like the back of a cat. 3. Stay in this position for 5 seconds. 4. Slowly lift your head and make your tailbone point up to the ceiling so your back hangs low (sags) like the back of a cow. 5. Stay in this position for 5 seconds.  Press-Ups  Do these steps 5-10 times in a row: 1. Lie on your belly (face-down) on the floor. 2. Place your hands near your head, about shoulder-width apart. 3. While you keep your back relaxed and keep your hips on the floor, slowly straighten your arms to raise the top half of your body and lift your shoulders. Do not use your back muscles. To make yourself more comfortable, you may change where you place your hands. 4. Stay in this position for 5 seconds. 5. Slowly return to lying flat on the floor.  Bridges  Do these steps 10 times in a row: 1. Lie on your back on a firm surface. 2. Bend your knees so they point up to the ceiling. Your feet should be flat on the floor. 3. Tighten your butt muscles and lift your butt off of the floor until your waist is almost as high as your knees. If you do not feel the muscles working in your butt and the back of your thighs, slide your feet 1-2 inches farther away from your butt. 4. Stay in this position for 3-5 seconds. 5. Slowly lower your butt to  the floor, and let your butt muscles relax.  If this exercise is too easy, try doing it with your arms crossed over your chest. Belly Crunches  Do these steps 5-10 times in a row: 1. Lie on your back on a firm bed or the floor with your legs stretched out. 2. Bend your knees so they point up to the ceiling. Your feet should be flat on the floor. 3. Cross your arms over your  chest. 4. Tip your chin a little bit toward your chest but do not bend your neck. 5. Tighten your belly muscles and slowly raise your chest just enough to lift your shoulder blades a tiny bit off of the floor. 6. Slowly lower your chest and your head to the floor.  Back Lifts Do these steps 5-10 times in a row: 1. Lie on your belly (face-down) with your arms at your sides, and rest your forehead on the floor. 2. Tighten the muscles in your legs and your butt. 3. Slowly lift your chest off of the floor while you keep your hips on the floor. Keep the back of your head in line with the curve in your back. Look at the floor while you do this. 4. Stay in this position for 3-5 seconds. 5. Slowly lower your chest and your face to the floor.  Contact a doctor if:  Your back pain gets a lot worse when you do an exercise.  Your back pain does not lessen 2 hours after you exercise. If you have any of these problems, stop doing the exercises. Do not do them again unless your doctor says it is okay. Get help right away if:  You have sudden, very bad back pain. If this happens, stop doing the exercises. Do not do them again unless your doctor says it is okay. This information is not intended to replace advice given to you by your health care provider. Make sure you discuss any questions you have with your health care provider. Document Released: 02/15/2010 Document Revised: 06/21/2015 Document Reviewed: 03/09/2014 Elsevier Interactive Patient Education  [employer].

## 2017-01-12 ENCOUNTER — Ambulatory Visit: Payer: BLUE CROSS/BLUE SHIELD | Admitting: Endocrinology

## 2017-01-14 ENCOUNTER — Encounter: Payer: Self-pay | Admitting: Nurse Practitioner

## 2017-01-14 ENCOUNTER — Ambulatory Visit (INDEPENDENT_AMBULATORY_CARE_PROVIDER_SITE_OTHER): Payer: BLUE CROSS/BLUE SHIELD | Admitting: Endocrinology

## 2017-01-14 ENCOUNTER — Encounter: Payer: Self-pay | Admitting: Endocrinology

## 2017-01-14 VITALS — BP 142/90 | HR 75 | Wt 197.0 lb

## 2017-01-14 DIAGNOSIS — E1165 Type 2 diabetes mellitus with hyperglycemia: Secondary | ICD-10-CM

## 2017-01-14 MED ORDER — INSULIN GLARGINE 100 UNIT/ML SOLOSTAR PEN: 60.0000 [IU] | PEN_INJECTOR | SUBCUTANEOUS | 11 refills | Status: DC

## 2017-01-14 NOTE — Patient Instructions (Addendum)
For now, please increase the insulin to 60 units each morning, and:  Atop taking the pioglitizone.  On this type of insulin schedule, you should eat meals on a regular schedule.  If a meal is missed or significantly delayed, your blood sugar could go low.   check your blood sugar twice a day.  vary the time of day when you check, between before the 3 meals, and at bedtime.  also check if you have symptoms of your blood sugar being too high or too low.  please keep a record of the readings and bring it to your next appointment here (or you can bring the meter itself).  You can write it on any piece of paper.  please call us sooner if your blood sugar goes below 70, or if you have a lot of readings over 200. Please come back for a follow-up appointment in 1 month.

## 2017-01-14 NOTE — Progress Notes (Signed)
Subjective:    Patient ID: [name], male    DOB: [birth year], 61 y.o.   MRN: [id number]  HPI Pt returns for f/u of diabetes mellitus: DM type: Insulin-requiring type 2 Dx'ed: 9983 Complications: none Therapy: insulin since mid-2018. DKA: never Severe hypoglycemia: never Pancreatitis: never Pancreatic imaging: normal on 2002 CT Other: he is on qd insulin, at least for now.   Interval history: Meter is downloaded today, and the printout is scanned into the record.  There are no data.  Pt says cbg's are in the 200's.  pt states he feels well in general. Past Medical History:  Diagnosis Date  . Asthma   . CAD (coronary artery disease)    a. non obst dz by cath 2004  b. 11/12/15 which showed no obvious large vessel CAD. No PCI or clear reason for NSTEMI ( possibly hypertensive urgency?).   [name] CARDIOMYOPATHY    a. NICM, LVEF 30% 01/2011 echo; 20-25% 07/2012 echo, EF recovered by echo 06/2015.  [name] DIABETES MELLITUS, TYPE II   . HYPERCHOLESTEROLEMIA   . HYPERTENSION   . Myocardial infarct ([place]) 10/2015  . Noncompliance   . Prostate cancer [facility])    prostate cancer  . PVCs (premature ventricular contractions)    a. s/p ablation 01-07-2013 by Dr Rayann Heman    Past Surgical History:  Procedure Laterality Date  . ABLATION  01-07-2013   RVOT PVC's ablated by Dr Rayann Heman along anteroseptal RVOT  . CARDIAC CATHETERIZATION  2004   nonobst dz  . CARDIAC CATHETERIZATION N/A 11/12/2015   Procedure: Left Heart Cath and Coronary Angiography;  Surgeon: Sherren Mocha, MD;  Location: [place] CV LAB;  Service: Cardiovascular;  Laterality: N/A;  . CHOLECYSTECTOMY N/A 11/16/2013   Procedure: LAPAROSCOPIC CHOLECYSTECTOMY WITH INTRAOPERATIVE CHOLANGIOGRAM;  Surgeon: [name], MD;  Location: [facility];  Service: General;  Laterality: N/A;  . LYMPHADENECTOMY Bilateral 02/22/2014   Procedure: PELVIC LYMPH NODE DISSECTION;  Surgeon: [name], MD;  Location: [facility];  Service: Urology;   Laterality: Bilateral;  . ROBOT ASSISTED LAPAROSCOPIC RADICAL PROSTATECTOMY N/A 02/22/2014   Procedure: ROBOTIC ASSISTED LAPAROSCOPIC RADICAL PROSTATECTOMY WITH INDOCYANINE GREEN DYE AND OPEN UMBILICAL HERNIA REPAIR;  Surgeon: [name], MD;  Location: [facility];  Service: Urology;  Laterality: N/A;  . SUPRAVENTRICULAR TACHYCARDIA ABLATION N/A 09/28/2012   Procedure: Tanna Furry Ablation;  Surgeon: [name], MD;  Location: [facility] CATH LAB;  Service: Cardiovascular;  Laterality: N/A;  . SURGERY SCROTAL / TESTICULAR     at age 55  . V-TACH ABLATION N/A 01/07/2013   Procedure: V-TACH ABLATION;  Surgeon: [name], MD;  Location: [place] CATH LAB;  Service: Cardiovascular;  Laterality: N/A;    Social History   Socioeconomic History  . Marital status: Single    Spouse name: Not on file  . Number of children: Not on file  . Years of education: Not on file  . Highest education level: Not on file  Social Needs  . Financial resource strain: Not on file  . Food insecurity - worry: Not on file  . Food insecurity - inability: Not on file  . Transportation needs - medical: Not on file  . Transportation needs - non-medical: Not on file  Occupational History  . Occupation: Pension scheme manager: [employer]  Tobacco Use  . Smoking status: Never Smoker  . Smokeless tobacco: Never Used  . 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  Alcohol use: No    Comment: rare  . Drug use: No  . Sexual activity: No  Other Topics Concern  . Not on file  Social History Narrative   Single - divorced x 3 -   Lives with 2 sons, enjoys spending time with 6 g-kids and 3 kids    Current Outpatient Medications on File Prior to Visit  Medication Sig Dispense Refill  . aspirin EC 81 MG EC tablet Take 1 tablet (81 mg total) by mouth daily.    . Aspirin-Salicylamide-Caffeine (BC HEADACHE POWDER PO) Take 1 Package by mouth daily as needed  (headache).    [name] atorvastatin (LIPITOR) 80 MG tablet Take 1 tablet (80 mg total) by mouth daily at 6 PM. Must keep Dec appt w/new provider for future refills 90 tablet 0  . carvedilol (COREG) 12.5 MG tablet Take 1 tablet (12.5 mg total) by mouth 2 (two) times daily with a meal. 180 tablet 0  . clopidogrel (PLAVIX) 75 MG tablet Take 1 tablet (75 mg total) daily by mouth. Please keep upcoming appt for future refills. Thanks 30 tablet 0  . hydrochlorothiazide (HYDRODIURIL) 12.5 MG tablet Take 1 tablet (12.5 mg total) daily by mouth. Please keep upcoming appt for future refills. Thanks 30 tablet 0  . isosorbide mononitrate (IMDUR) 60 MG 24 hr tablet Take 1 tablet (60 mg total) daily by mouth. Please keep upcoming appt for future refills. Thanks 30 tablet 0  . lisinopril (PRINIVIL,ZESTRIL) 40 MG tablet Take 1 tablet (40 mg total) daily by mouth. Please keep upcoming appt for future refills. Thanks 30 tablet 0  . omeprazole (PRILOSEC) 20 MG capsule Take 1 capsule (20 mg total) by mouth 2 (two) times daily before a meal. 60 capsule 0   No current facility-administered medications on file prior to visit.     Allergies  Allergen Reactions  . Ambien [Zolpidem Tartrate] Other (See Comments)    Pt states that this medication made him go crazy.   . Metformin And Related Diarrhea    Family History  Problem Relation Age of Onset  . Hypertension Mother   . Peripheral vascular disease Mother   . Diabetes Mother   . Peripheral vascular disease Unknown   . Hypertension Unknown   . Prostate cancer Unknown   . Prostate cancer Brother   . Heart attack Paternal Grandfather   . Heart failure Maternal Grandmother   . Diabetes Son     BP (!) 142/90 (BP Location: Left Arm, Patient Position: Sitting, Cuff Size: Normal)   Pulse 75   Wt 197 lb (89.4 kg)   SpO2 97%   BMI 32.78 kg/m    Review of Systems he denies hypoglycemia.     Objective:   Physical Exam VITAL SIGNS:  See vs page.  GENERAL: no  distress.  Pulses: dorsalis pedis intact bilat.   MSK: no deformity of the feet.  CV: 1+ bilat leg edema.  Skin:  no ulcer on the feet.  normal color and temp on the feet.  Neuro: sensation is intact to touch on the feet.  Ext: There is bilateral onychomycosis of the toenails.       Assessment & Plan:  Insulin-requiring type 2 DM: he needs increased rx.  Edema: worse.  Noncompliance with cbg recording: he does not seem like a candidate for multiple daily injections.   Patient Instructions  For now, please increase the insulin to 60 units each morning, and:  Atop taking the pioglitizone.  On this type  of insulin schedule, you should eat meals on a regular schedule.  If a meal is missed or significantly delayed, your blood sugar could go low.   check your blood sugar twice a day.  vary the time of day when you check, between before the 3 meals, and at bedtime.  also check if you have symptoms of your blood sugar being too high or too low.  please keep a record of the readings and bring it to your next appointment here (or you can bring the meter itself).  You can write it on any piece of paper.  please call us sooner if your blood sugar goes below 70, or if you have a lot of readings over 200. Please come back for a follow-up appointment in 1 month.

## 2017-01-16 ENCOUNTER — Other Ambulatory Visit: Payer: Self-pay | Admitting: Physician Assistant

## 2017-02-08 ENCOUNTER — Encounter (HOSPITAL_COMMUNITY): Payer: Self-pay | Admitting: *Deleted

## 2017-02-08 ENCOUNTER — Emergency Department (HOSPITAL_COMMUNITY)
Admission: EM | Admit: 2017-02-08 | Discharge: 2017-02-08 | Disposition: A | Discharge status: Home / Self Care | Payer: BLUE CROSS/BLUE SHIELD | Attending: Emergency Medicine | Admitting: Emergency Medicine

## 2017-02-08 ENCOUNTER — Other Ambulatory Visit: Payer: Self-pay

## 2017-02-08 ENCOUNTER — Emergency Department (HOSPITAL_COMMUNITY): Payer: BLUE CROSS/BLUE SHIELD

## 2017-02-08 DIAGNOSIS — R197 Diarrhea, unspecified: Secondary | ICD-10-CM | POA: Diagnosis present

## 2017-02-08 DIAGNOSIS — R42 Dizziness and giddiness: Secondary | ICD-10-CM | POA: Insufficient documentation

## 2017-02-08 DIAGNOSIS — R0602 Shortness of breath: Secondary | ICD-10-CM | POA: Insufficient documentation

## 2017-02-08 LAB — BASIC METABOLIC PANEL
Anion gap: 11 (ref 5–15)
BUN: 32 mg/dL — ABNORMAL HIGH (ref 6–20)
CO2: 21 mmol/L — ABNORMAL LOW (ref 22–32)
Calcium: 8.6 mg/dL — ABNORMAL LOW (ref 8.9–10.3)
Chloride: 104 mmol/L (ref 101–111)
Creatinine, Ser: 2.07 mg/dL — ABNORMAL HIGH (ref 0.61–1.24)
GFR calc Af Amer: 38 mL/min — ABNORMAL LOW (ref >60)
GFR calc non Af Amer: 33 mL/min — ABNORMAL LOW (ref >60)
Glucose, Bld: 136 mg/dL — ABNORMAL HIGH (ref 65–99)
Potassium: 4 mmol/L (ref 3.5–5.1)
Sodium: 136 mmol/L (ref 135–145)

## 2017-02-08 LAB — CBC WITH DIFFERENTIAL/PLATELET
Basophils Absolute: 0 10*3/uL (ref 0.0–0.1)
Basophils Relative: 1 %
Eosinophils Absolute: 0 10*3/uL (ref 0.0–0.7)
Eosinophils Relative: 1 %
HCT: 42.4 % (ref 39.0–52.0)
Hemoglobin: 14.4 g/dL (ref 13.0–17.0)
Lymphocytes Relative: 39 %
Lymphs Abs: 1.7 10*3/uL (ref 0.7–4.0)
MCH: 30.4 pg (ref 26.0–34.0)
MCHC: 34 g/dL (ref 30.0–36.0)
MCV: 89.6 fL (ref 78.0–100.0)
Monocytes Absolute: 0.5 10*3/uL (ref 0.1–1.0)
Monocytes Relative: 12 %
Neutro Abs: 2.1 10*3/uL (ref 1.7–7.7)
Neutrophils Relative %: 49 %
Platelets: 257 10*3/uL (ref 150–400)
RBC: 4.73 MIL/uL (ref 4.22–5.81)
RDW: 12.9 % (ref 11.5–15.5)
WBC: 4.3 10*3/uL (ref 4.0–10.5)

## 2017-02-08 MED ORDER — SODIUM CHLORIDE 0.9 % IV BOLUS (SEPSIS)
1000.0000 mL | Freq: Once | INTRAVENOUS | Status: AC
  Administered 2017-02-08: 1000 mL via INTRAVENOUS

## 2017-02-08 MED ORDER — LOPERAMIDE HCL 2 MG PO CAPS
4.0000 mg | ORAL_CAPSULE | Freq: Once | ORAL | Status: AC
  Administered 2017-02-08: 4 mg via ORAL
  Filled 2017-02-08: qty 2

## 2017-02-08 MED ORDER — MORPHINE SULFATE (PF) 4 MG/ML IV SOLN
4.0000 mg | Freq: Once | INTRAVENOUS | Status: AC
  Administered 2017-02-08: 4 mg via INTRAVENOUS
  Filled 2017-02-08: qty 1

## 2017-02-08 MED ORDER — ALBUTEROL SULFATE (2.5 MG/3ML) 0.083% IN NEBU
5.0000 mg | INHALATION_SOLUTION | Freq: Once | RESPIRATORY_TRACT | Status: AC
  Administered 2017-02-08: 5 mg via RESPIRATORY_TRACT
  Filled 2017-02-08: qty 6

## 2017-02-08 NOTE — ED Triage Notes (Signed)
Pt reports he developed diarrhea yesterday, 5 times this am, shob, weakness and dizziness.

## 2017-02-08 NOTE — ED Notes (Signed)
Patient transported to X-ray 

## 2017-02-10 NOTE — ED Provider Notes (Signed)
[facility] DEPT Provider Note   CSN: [id number] Arrival date & time: 02/08/17  1139     History   Chief Complaint Chief Complaint  Patient presents with  . Shortness of Breath  . Diarrhea  . Dizziness    HPI [name] is a 62 y.o. male.  HPI   62 year old male with diarrhea.  Started yesterday.  5 episodes.  No blood or melena.  Also feels somewhat short of breath and some generalized weakness.  No fevers.  No cough.  No nausea or vomiting.  No sick contacts.  Past Medical History:  Diagnosis Date  . Asthma   . CAD (coronary artery disease)    a. non obst dz by cath 2004  b. 11/12/15 which showed no obvious large vessel CAD. No PCI or clear reason for NSTEMI ( possibly hypertensive urgency?).   [name] CARDIOMYOPATHY    a. NICM, LVEF 30% 01/2011 echo; 20-25% 07/2012 echo, EF recovered by echo 06/2015.  [name] DIABETES MELLITUS, TYPE II   . HYPERCHOLESTEROLEMIA   . HYPERTENSION   . Myocardial infarct ([place]) 10/2015  . Noncompliance   . Prostate cancer [facility])    prostate cancer  . PVCs (premature ventricular contractions)    a. s/p ablation 01-07-2013 by Dr Rayann Heman    Patient Active Problem List   Diagnosis Date Noted  . Balanitis 10/15/2016  . NSTEMI (non-ST elevated myocardial infarction) ([place]) 11/13/2015  . PVCs (premature ventricular contractions)   . CAD (coronary artery disease)   . Acute kidney injury ([place]) 07/19/2015  . AKI (acute kidney injury) ([place]) 07/19/2015  . Hyperglycemia 07/12/2015  . Prostate cancer ([place]) 02/22/2014  . Cholelithiasis and cholecystitis without obstruction 11/16/2013  . PVC's (premature ventricular contractions) 01/07/2013  . CKD (chronic kidney disease) stage 2, GFR 60-89 ml/min 08/25/2012  . OSA (obstructive sleep apnea) 08/25/2012  . Chronic systolic heart failure ([place]) 08/06/2012  . HTN (hypertension), malignant 02/12/2011  . Non compliance w medication regimen 02/12/2011  . HYPERCHOLESTEROLEMIA  09/17/2009  . PREMATURE VENTRICULAR CONTRACTIONS 09/17/2009  . PSA, INCREASED 04/26/2009  . Diabetes type 2, uncontrolled ([place]) 04/25/2009  . CARDIOMYOPATHY 04/25/2009    Past Surgical History:  Procedure Laterality Date  . ABLATION  01-07-2013   RVOT PVC's ablated by Dr Rayann Heman along anteroseptal RVOT  . CARDIAC CATHETERIZATION  2004   nonobst dz  . CARDIAC CATHETERIZATION N/A 11/12/2015   Procedure: Left Heart Cath and Coronary Angiography;  Surgeon: Sherren Mocha, MD;  Location: [facility] CV LAB;  Service: Cardiovascular;  Laterality: N/A;  . CHOLECYSTECTOMY N/A 11/16/2013   Procedure: LAPAROSCOPIC CHOLECYSTECTOMY WITH INTRAOPERATIVE CHOLANGIOGRAM;  Surgeon: [name], MD;  Location: [facility] OR[facility];  Service: General;  Laterality: N/A;  . LYMPHADENECTOMY Bilateral 02/22/2014   Procedure: PELVIC LYMPH NODE DISSECTION;  Surgeon: [name], MD;  Location: [facility] OR[facility];  Service: Urology;  Laterality: Bilateral;  . ROBOT ASSISTED LAPAROSCOPIC RADICAL PROSTATECTOMY N/A 02/22/2014   Procedure: ROBOTIC ASSISTED LAPAROSCOPIC RADICAL PROSTATECTOMY WITH INDOCYANINE GREEN DYE AND OPEN UMBILICAL HERNIA REPAIR;  Surgeon: [name], MD;  Location: [facility];  Service: Urology;  Laterality: N/A;  . SUPRAVENTRICULAR TACHYCARDIA ABLATION N/A 09/28/2012   Procedure: Tanna Furry Ablation;  Surgeon: [name], MD;  Location: [facility] CATH LAB;  Service: Cardiovascular;  Laterality: N/A;  . SURGERY SCROTAL / TESTICULAR     at age 35  . V-TACH ABLATION N/A 01/07/2013   Procedure: V-TACH ABLATION;  Surgeon: [name], MD;  Location: [facility] CATH LAB;  Service: Cardiovascular;  Laterality: N/A;       Home Medications    Prior to Admission medications   Medication Sig Start Date End Date Taking? Authorizing Provider  aspirin EC 81 MG EC tablet Take 1 tablet (81 mg total) by mouth daily. 11/14/15  Yes Eileen Stanford, PA-C  Aspirin-Salicylamide-Caffeine (BC HEADACHE POWDER PO) Take 1 Package by mouth daily  as needed (headache).   Yes [provider]  atorvastatin (LIPITOR) 80 MG tablet Take 1 tablet (80 mg total) by mouth daily at 6 PM. Must keep Dec appt w/new provider for future refills 11/28/16  Yes Lance Sell, NP  carvedilol (COREG) 12.5 MG tablet Take 1 tablet (12.5 mg total) by mouth 2 (two) times daily with a meal. 10/03/16  Yes Jerline Pain, MD  clopidogrel (PLAVIX) 75 MG tablet Take 1 tablet (75 mg total) by mouth daily. 01/19/17  Yes Eileen Stanford, PA-C  hydrochlorothiazide (HYDRODIURIL) 25 MG tablet Take 1 tablet (25 mg total) by mouth daily. 01/19/17  Yes Eileen Stanford, PA-C  Insulin Glargine (LANTUS) 100 UNIT/ML Solostar Pen Inject 60 Units into the skin every morning. And pen needles 2/day 01/14/17  Yes Renato Shin, MD  isosorbide mononitrate (IMDUR) 60 MG 24 hr tablet Take 1 tablet (60 mg total) by mouth daily. 01/19/17  Yes Eileen Stanford, PA-C  lisinopril (PRINIVIL,ZESTRIL) 40 MG tablet Take 1 tablet (40 mg total) by mouth daily. 01/19/17  Yes Eileen Stanford, PA-C  omeprazole (PRILOSEC) 20 MG capsule Take 1 capsule (20 mg total) by mouth 2 (two) times daily before a meal. Patient not taking: Reported on 02/08/2017 01/26/16   Virgel Manifold, MD    Family History Family History  Problem Relation Age of Onset  . Hypertension Mother   . Peripheral vascular disease Mother   . Diabetes Mother   . Peripheral vascular disease Unknown   . Hypertension Unknown   . Prostate cancer Unknown   . Prostate cancer Brother   . Heart attack Paternal Grandfather   . Heart failure Maternal Grandmother   . Diabetes Son     Social History Social History   Tobacco Use  . Smoking status: Never Smoker  . Smokeless tobacco: Never Used  . 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 Alcohol use: No    Comment: rare  . Drug use: No     Allergies   Ambien [zolpidem tartrate] and Metformin and  related   Review of Systems Review of Systems  All systems reviewed and negative, other than as noted in HPI.   Physical Exam Updated Vital Signs BP 109/73   Pulse 78   Temp (!) 97.5 F (36.4 C) (Oral)   Resp (!) 23   Ht 5\' 5"  (1.651 m)   Wt 81.6 kg (180 lb)   SpO2 98%   BMI 29.95 kg/m   Physical Exam  Constitutional: He appears well-developed and well-nourished. No distress.  HENT:  Head: Normocephalic and atraumatic.  Eyes: Conjunctivae are normal. Right eye exhibits no discharge. Left eye exhibits no discharge.  Neck: Neck supple.  Cardiovascular: Normal rate, regular rhythm and normal heart sounds. Exam reveals no gallop and no friction rub.  No murmur heard. Pulmonary/Chest: Effort normal. No respiratory distress. He has wheezes.  Faint wheezing noted  Abdominal: Soft. He exhibits no distension. There is no tenderness.  Musculoskeletal: He exhibits no edema or tenderness.  Neurological: He is alert.  Skin: Skin is  warm and dry.  Psychiatric: He has a normal mood and affect. His behavior is normal. Thought content normal.  Nursing note and vitals reviewed.    ED Treatments / Results  Labs (all labs ordered are listed, but only abnormal results are displayed) Labs Reviewed  BASIC METABOLIC PANEL - Abnormal; Notable for the following components:      Result Value   CO2 21 (*)    Glucose, Bld 136 (*)    BUN 32 (*)    Creatinine, Ser 2.07 (*)    Calcium 8.6 (*)    GFR calc non Af Amer 33 (*)    GFR calc Af Amer 38 (*)    All other components within normal limits  CBC WITH DIFFERENTIAL/PLATELET    EKG  EKG Interpretation  Date/Time:  Sunday February 08 2017 11:50:23 EST Ventricular Rate:  80 PR Interval:    QRS Duration: 115 QT Interval:  382 QTC Calculation: 441 R Axis:   -37 Text Interpretation:  Sinus rhythm LVH with IVCD, LAD and secondary repol abnrm Confirmed by Virgel Manifold [phone]) on 02/08/2017 12:29:54 PM       Radiology Dg Chest 2  View  Result Date: 02/08/2017 CLINICAL DATA:  Shortness of breath, dizziness EXAM: CHEST  2 VIEW COMPARISON:  10/13/2016 FINDINGS: Heart and mediastinal contours are within normal limits. No focal opacities or effusions. No acute bony abnormality. IMPRESSION: No active cardiopulmonary disease. Electronically Signed   By: Rolm Baptise M.D.   On: 02/08/2017 12:17    Procedures Procedures (including critical care time)  Medications Ordered in ED Medications  albuterol (PROVENTIL) (2.5 MG/3ML) 0.083% nebulizer solution 5 mg (5 mg Nebulization Given 02/08/17 1232)  sodium chloride 0.9 % bolus 1,000 mL (0 mLs Intravenous Stopped 02/08/17 1516)  loperamide (IMODIUM) capsule 4 mg (4 mg Oral Given 02/08/17 1323)  morphine 4 MG/ML injection 4 mg (4 mg Intravenous Given 02/08/17 1324)     Initial Impression / Assessment and Plan / ED Course  I have reviewed the triage vital signs and the nursing notes.  Pertinent labs & imaging results that were available during my care of the patient were reviewed by me and considered in my medical decision making (see chart for details).      Final Clinical Impressions(s) / ED Diagnoses   Final diagnoses:  Diarrhea, unspecified type    ED Discharge Orders    None       Virgel Manifold, MD 02/10/17 1026

## 2017-02-17 ENCOUNTER — Other Ambulatory Visit: Payer: Self-pay | Admitting: Cardiology

## 2017-02-19 ENCOUNTER — Encounter: Payer: BLUE CROSS/BLUE SHIELD | Admitting: Internal Medicine

## 2017-04-17 ENCOUNTER — Other Ambulatory Visit: Payer: Self-pay | Admitting: Endocrinology

## 2017-04-17 ENCOUNTER — Other Ambulatory Visit: Payer: Self-pay | Admitting: Nurse Practitioner

## 2017-08-18 ENCOUNTER — Other Ambulatory Visit: Payer: Self-pay | Admitting: Endocrinology

## 2017-10-19 ENCOUNTER — Other Ambulatory Visit: Payer: Self-pay | Admitting: Nurse Practitioner

## 2017-12-22 ENCOUNTER — Other Ambulatory Visit: Payer: Self-pay | Admitting: Endocrinology

## 2018-02-02 ENCOUNTER — Other Ambulatory Visit: Payer: Self-pay | Admitting: Physician Assistant

## 2018-03-02 ENCOUNTER — Encounter: Payer: Self-pay | Admitting: Nurse Practitioner

## 2018-03-02 ENCOUNTER — Telehealth: Payer: Self-pay | Admitting: Nurse Practitioner

## 2018-03-02 NOTE — Telephone Encounter (Signed)
Pt call in with the names of the medication that he needs refill pt is sched 03/11/2018@3pm  with Tye Savoy   lisinopril (PRINIVIL,ZESTRIL) 40 MG tablet atorvastatin (LIPITOR) 80 MG tablet clopidogrel (PLAVIX) 75 MG tablet carvedilol (COREG) 12.5 MG tablet isosorbide mononitrate (IMDUR) 60 MG 24 hr tablet hydrochlorothiazide (HYDRODIURIL) 25 MG tablet  Pt will Iike for prescriptions be sent to [facility] [address], [place], [place] [address]  [phone]

## 2018-03-04 NOTE — Telephone Encounter (Signed)
Peter Congo,  I have not seen this patient recently, if at all. Furthermore these are not meds are not ones that I would be prescribing. Please let patient know. Thanks

## 2018-03-05 ENCOUNTER — Other Ambulatory Visit: Payer: Self-pay | Admitting: Cardiology

## 2018-03-05 MED ORDER — ISOSORBIDE MONONITRATE ER 60 MG PO TB24: 60.0000 mg | ORAL_TABLET | Freq: Every day | ORAL | 0 refills | Status: DC

## 2018-03-05 MED ORDER — CLOPIDOGREL BISULFATE 75 MG PO TABS: 75.0000 mg | ORAL_TABLET | Freq: Every day | ORAL | 0 refills | Status: DC

## 2018-03-05 MED ORDER — LISINOPRIL 40 MG PO TABS: 40.0000 mg | ORAL_TABLET | Freq: Every day | ORAL | 0 refills | Status: DC

## 2018-03-05 MED ORDER — CARVEDILOL 12.5 MG PO TABS: ORAL_TABLET | ORAL | 0 refills | Status: DC

## 2018-03-05 MED ORDER — HYDROCHLOROTHIAZIDE 25 MG PO TABS: 25.0000 mg | ORAL_TABLET | Freq: Every day | ORAL | 0 refills | Status: DC

## 2018-03-05 NOTE — Telephone Encounter (Signed)
°*  STAT* If patient is at the pharmacy, call can be transferred to refill team.   1. Which medications need to be refilled? (please list name of each medication and dose if known)  carvedilol (COREG) 12.5 MG tablet clopidogrel (PLAVIX) 75 MG tablet lisinopril (PRINIVIL,ZESTRIL) 40 MG tablet isosorbide mononitrate (IMDUR) 60 MG 24 hr tablet atorvastatin (LIPITOR) 80 MG tablet  hydrochlorothiazide (HYDRODIURIL) 25 MG tablet     2. Which pharmacy/location (including street and city if local pharmacy) is medication to be sent to? [facility]  3. Do they need a 30 day or 90 day supply? 30 days  Patient has appt scheduled for 3/2

## 2018-03-05 NOTE — Telephone Encounter (Signed)
Patient advised that we cant refill these medications.

## 2018-03-05 NOTE — Telephone Encounter (Signed)
Pt's medications were sent to pt's pharmacy as requested. Confirmation received.  

## 2018-03-10 ENCOUNTER — Encounter: Payer: Self-pay | Admitting: Cardiology

## 2018-03-11 ENCOUNTER — Ambulatory Visit (INDEPENDENT_AMBULATORY_CARE_PROVIDER_SITE_OTHER): Payer: BLUE CROSS/BLUE SHIELD | Admitting: Nurse Practitioner

## 2018-03-11 ENCOUNTER — Encounter: Payer: Self-pay | Admitting: Nurse Practitioner

## 2018-03-11 VITALS — BP 156/92 | HR 69 | Ht 65.0 in | Wt 185.2 lb

## 2018-03-11 DIAGNOSIS — I10 Essential (primary) hypertension: Secondary | ICD-10-CM

## 2018-03-11 NOTE — Progress Notes (Signed)
     This appointment was made by the patient in error.  He thought our office would refill his Plavix and hypertension medications.  I recommended patient go down to the first floor where primary care is located.  He was formerly a patient there though I think Dr. Loanne Drilling is now just specializing in endocrinology.  Hopefully they can get patient a different provider in the practice.

## 2018-03-12 ENCOUNTER — Other Ambulatory Visit (INDEPENDENT_AMBULATORY_CARE_PROVIDER_SITE_OTHER): Payer: BLUE CROSS/BLUE SHIELD

## 2018-03-12 ENCOUNTER — Ambulatory Visit (INDEPENDENT_AMBULATORY_CARE_PROVIDER_SITE_OTHER): Payer: BLUE CROSS/BLUE SHIELD | Admitting: Nurse Practitioner

## 2018-03-12 ENCOUNTER — Encounter: Payer: Self-pay | Admitting: Nurse Practitioner

## 2018-03-12 VITALS — BP 140/90 | HR 69 | Ht 65.0 in | Wt 185.0 lb

## 2018-03-12 DIAGNOSIS — Z1159 Encounter for screening for other viral diseases: Secondary | ICD-10-CM

## 2018-03-12 DIAGNOSIS — Z23 Encounter for immunization: Secondary | ICD-10-CM | POA: Diagnosis not present

## 2018-03-12 DIAGNOSIS — Z0000 Encounter for general adult medical examination without abnormal findings: Secondary | ICD-10-CM | POA: Diagnosis not present

## 2018-03-12 DIAGNOSIS — E7800 Pure hypercholesterolemia, unspecified: Secondary | ICD-10-CM

## 2018-03-12 DIAGNOSIS — Z9189 Other specified personal risk factors, not elsewhere classified: Secondary | ICD-10-CM

## 2018-03-12 DIAGNOSIS — I5022 Chronic systolic (congestive) heart failure: Secondary | ICD-10-CM

## 2018-03-12 DIAGNOSIS — E1165 Type 2 diabetes mellitus with hyperglycemia: Secondary | ICD-10-CM | POA: Diagnosis not present

## 2018-03-12 DIAGNOSIS — Z114 Encounter for screening for human immunodeficiency virus [HIV]: Secondary | ICD-10-CM

## 2018-03-12 DIAGNOSIS — I10 Essential (primary) hypertension: Secondary | ICD-10-CM

## 2018-03-12 DIAGNOSIS — I214 Non-ST elevation (NSTEMI) myocardial infarction: Secondary | ICD-10-CM | POA: Diagnosis not present

## 2018-03-12 LAB — LIPID PANEL
Cholesterol: 175 mg/dL (ref 0–200)
HDL: 37.6 mg/dL — ABNORMAL LOW (ref >39.00)
LDL Cholesterol: 118 mg/dL — ABNORMAL HIGH (ref 0–99)
NonHDL: 137.8
Total CHOL/HDL Ratio: 5
Triglycerides: 100 mg/dL (ref 0.0–149.0)
VLDL: 20 mg/dL (ref 0.0–40.0)

## 2018-03-12 LAB — CBC
HCT: 38.5 % — ABNORMAL LOW (ref 39.0–52.0)
Hemoglobin: 12.9 g/dL — ABNORMAL LOW (ref 13.0–17.0)
MCHC: 33.5 g/dL (ref 30.0–36.0)
MCV: 88.6 fl (ref 78.0–100.0)
Platelets: 301 10*3/uL (ref 150.0–400.0)
RBC: 4.34 Mil/uL (ref 4.22–5.81)
RDW: 13.6 % (ref 11.5–15.5)
WBC: 7.7 10*3/uL (ref 4.0–10.5)

## 2018-03-12 LAB — COMPREHENSIVE METABOLIC PANEL
ALT: 18 U/L (ref 0–53)
AST: 18 U/L (ref 0–37)
Albumin: 3.8 g/dL (ref 3.5–5.2)
Alkaline Phosphatase: 76 U/L (ref 39–117)
BUN: 20 mg/dL (ref 6–23)
CO2: 24 mEq/L (ref 19–32)
Calcium: 9.2 mg/dL (ref 8.4–10.5)
Chloride: 109 mEq/L (ref 96–112)
Creatinine, Ser: 1.32 mg/dL (ref 0.40–1.50)
GFR: 66.1 mL/min (ref >60.00)
Glucose, Bld: 168 mg/dL — ABNORMAL HIGH (ref 70–99)
Potassium: 4.5 mEq/L (ref 3.5–5.1)
Sodium: 140 mEq/L (ref 135–145)
Total Bilirubin: 0.3 mg/dL (ref 0.2–1.2)
Total Protein: 7.2 g/dL (ref 6.0–8.3)

## 2018-03-12 LAB — HEMOGLOBIN A1C: Hgb A1c MFr Bld: 12.6 % — ABNORMAL HIGH (ref 4.6–6.5)

## 2018-03-12 MED ORDER — LISINOPRIL 40 MG PO TABS: 40.0000 mg | ORAL_TABLET | Freq: Every day | ORAL | 0 refills | Status: DC

## 2018-03-12 MED ORDER — CLOPIDOGREL BISULFATE 75 MG PO TABS: 75.0000 mg | ORAL_TABLET | Freq: Every day | ORAL | 0 refills | Status: DC

## 2018-03-12 MED ORDER — HYDROCHLOROTHIAZIDE 25 MG PO TABS: 25.0000 mg | ORAL_TABLET | Freq: Every day | ORAL | 0 refills | Status: DC

## 2018-03-12 MED ORDER — CARVEDILOL 12.5 MG PO TABS: ORAL_TABLET | ORAL | 0 refills | Status: DC

## 2018-03-12 MED ORDER — ISOSORBIDE MONONITRATE ER 60 MG PO TB24: 60.0000 mg | ORAL_TABLET | Freq: Every day | ORAL | 0 refills | Status: DC

## 2018-03-12 MED ORDER — ATORVASTATIN CALCIUM 80 MG PO TABS: 80.0000 mg | ORAL_TABLET | Freq: Every day | ORAL | 0 refills | Status: DC

## 2018-03-12 NOTE — Patient Instructions (Addendum)
Head downstairs for labs today  Please keep your upcoming follow up with cardiology Please call to schedule a visit with endocrinology   I will see you back in 1 year, or sooner if needed   Health Maintenance, Male A healthy lifestyle and preventive care is important for your health and wellness. Ask your health care provider about what schedule of regular examinations is right for you. What should I know about weight and diet? Eat a Healthy Diet  Eat plenty of vegetables, fruits, whole grains, low-fat dairy products, and lean protein.  Do not eat a lot of foods high in solid fats, added sugars, or salt.  Maintain a Healthy Weight Regular exercise can help you achieve or maintain a healthy weight. You should:  Do at least 150 minutes of exercise each week. The exercise should increase your heart rate and make you sweat (moderate-intensity exercise).  Do strength-training exercises at least twice a week. Watch Your Levels of Cholesterol and Blood Lipids  Have your blood tested for lipids and cholesterol every 5 years starting at 64 years of age. If you are at high risk for heart disease, you should start having your blood tested when you are 64 years old. You may need to have your cholesterol levels checked more often if: ? Your lipid or cholesterol levels are high. ? You are older than 64 years of age. ? You are at high risk for heart disease. What should I know about cancer screening? Many types of cancers can be detected early and may often be prevented. Lung Cancer  You should be screened every year for lung cancer if: ? You are a current smoker who has smoked for at least 30 years. ? You are a former smoker who has quit within the past 15 years.  Talk to your health care provider about your screening options, when you should start screening, and how often you should be screened. Colorectal Cancer  Routine colorectal cancer screening usually begins at 64 years of age and  should be repeated every 5-10 years until you are 64 years old. You may need to be screened more often if early forms of precancerous polyps or small growths are found. Your health care provider may recommend screening at an earlier age if you have risk factors for colon cancer.  Your health care provider may recommend using home test kits to check for hidden blood in the stool.  A small camera at the end of a tube can be used to examine your colon (sigmoidoscopy or colonoscopy). This checks for the earliest forms of colorectal cancer. Prostate and Testicular Cancer  Depending on your age and overall health, your health care provider may do certain tests to screen for prostate and testicular cancer.  Talk to your health care provider about any symptoms or concerns you have about testicular or prostate cancer. Skin Cancer  Check your skin from head to toe regularly.  Tell your health care provider about any new moles or changes in moles, especially if: ? There is a change in a mole's size, shape, or color. ? You have a mole that is larger than a pencil eraser.  Always use sunscreen. Apply sunscreen liberally and repeat throughout the day.  Protect yourself by wearing long sleeves, pants, a wide-brimmed hat, and sunglasses when outside. What should I know about heart disease, diabetes, and high blood pressure?  If you are 59-51 years of age, have your blood pressure checked every 3-5 years. If you are  20 years of age or older, have your blood pressure checked every year. You should have your blood pressure measured twice-once when you are at a hospital or clinic, and once when you are not at a hospital or clinic. Record the average of the two measurements. To check your blood pressure when you are not at a hospital or clinic, you can use: ? An automated blood pressure machine at a pharmacy. ? A home blood pressure monitor.  Talk to your health care provider about your target blood  pressure.  If you are between 10-52 years old, ask your health care provider if you should take aspirin to prevent heart disease.  Have regular diabetes screenings by checking your fasting blood sugar level. ? If you are at a normal weight and have a low risk for diabetes, have this test once every three years after the age of 55. ? If you are overweight and have a high risk for diabetes, consider being tested at a younger age or more often.  A one-time screening for abdominal aortic aneurysm (AAA) by ultrasound is recommended for men aged 28-75 years who are current or former smokers. What should I know about preventing infection? Hepatitis B If you have a higher risk for hepatitis B, you should be screened for this virus. Talk with your health care provider to find out if you are at risk for hepatitis B infection. Hepatitis C Blood testing is recommended for:  Everyone born from [birth year] through 1966.  Anyone with known risk factors for hepatitis C. Sexually Transmitted Diseases (STDs)  You should be screened each year for STDs including gonorrhea and chlamydia if: ? You are sexually active and are younger than 64 years of age. ? You are older than 64 years of age and your health care provider tells you that you are at risk for this type of infection. ? Your sexual activity has changed since you were last screened and you are at an increased risk for chlamydia or gonorrhea. Ask your health care provider if you are at risk.  Talk with your health care provider about whether you are at high risk of being infected with HIV. Your health care provider may recommend a prescription medicine to help prevent HIV infection. What else can I do?  Schedule regular health, dental, and eye exams.  Stay current with your vaccines (immunizations).  Do not use any tobacco products, such as cigarettes, chewing tobacco, and e-cigarettes. If you need help quitting, ask your health care provider.  Limit  alcohol intake to no more than 2 drinks per day. One drink equals 12 ounces of beer, 5 ounces of wine, or 1 ounces of hard liquor.  Do not use street drugs.  Do not share needles.  Ask your health care provider for help if you need support or information about quitting drugs.  Tell your health care provider if you often feel depressed.  Tell your health care provider if you have ever been abused or do not feel safe at home. This information is not intended to replace advice given to you by your health care provider. Make sure you discuss any questions you have with your health care provider. Document Released: 07/12/2007 Document Revised: 09/12/2015 Document Reviewed: 10/17/2014 Elsevier Interactive Patient Education  2019 [employer].

## 2018-03-12 NOTE — Assessment & Plan Note (Signed)
Continue current medications Med refill sent  He will keep upcoming cardiology follow up as planned - CBC; Future - Comprehensive metabolic panel; Future

## 2018-03-12 NOTE — Progress Notes (Signed)
[name] is a 63 y.o. male with the following history as recorded in EpicCare:  Patient Active Problem List   Diagnosis Date Noted  . Balanitis 10/15/2016  . NSTEMI (non-ST elevated myocardial infarction) ([place]) 11/13/2015  . PVCs (premature ventricular contractions)   . CAD (coronary artery disease)   . Acute kidney injury ([place]) 07/19/2015  . AKI (acute kidney injury) ([place]) 07/19/2015  . Hyperglycemia 07/12/2015  . Prostate cancer ([place]) 02/22/2014  . Cholelithiasis and cholecystitis without obstruction 11/16/2013  . PVC's (premature ventricular contractions) 01/07/2013  . CKD (chronic kidney disease) stage 2, GFR 60-89 ml/min 08/25/2012  . OSA (obstructive sleep apnea) 08/25/2012  . Chronic systolic heart failure ([place]) 08/06/2012  . HTN (hypertension), malignant 02/12/2011  . Non compliance w medication regimen 02/12/2011  . HYPERCHOLESTEROLEMIA 09/17/2009  . PREMATURE VENTRICULAR CONTRACTIONS 09/17/2009  . PSA, INCREASED 04/26/2009  . Diabetes type 2, uncontrolled ([place]) 04/25/2009  . CARDIOMYOPATHY 04/25/2009    Current Outpatient Medications  Medication Sig Dispense Refill  . aspirin EC 81 MG EC tablet Take 1 tablet (81 mg total) by mouth daily.    . Aspirin-Salicylamide-Caffeine (BC HEADACHE POWDER PO) Take 1 Package by mouth daily as needed (headache).    [name] atorvastatin (LIPITOR) 80 MG tablet TAKE 1 TABLET BY MOUTH ONCE DAILY AT  6PM 90 tablet 0  . carvedilol (COREG) 12.5 MG tablet TAKE 1 TABLET BY MOUTH TWICE DAILY WITH A MEAL Please keep upcoming appt in March before anymore refills. Thank you 60 tablet 0  . clopidogrel (PLAVIX) 75 MG tablet Take 1 tablet (75 mg total) by mouth daily. Please keep upcoming appt in March before anymore refills. Thank you 30 tablet 0  . hydrochlorothiazide (HYDRODIURIL) 25 MG tablet Take 1 tablet (25 mg total) by mouth daily. Please keep upcoming appt in March before anymore refills. Thank you 30 tablet 0  . Insulin Glargine (LANTUS) 100  UNIT/ML Solostar Pen Inject 60 Units into the skin every morning. And pen needles 2/day 30 mL 11  . isosorbide mononitrate (IMDUR) 60 MG 24 hr tablet Take 1 tablet (60 mg total) by mouth daily. Please keep upcoming appt in March before anymore refills. Thank you 30 tablet 0  . LANTUS SOLOSTAR 100 UNIT/ML Solostar Pen INJECT 50 UNITS SUBCUTANEOUSLY ONCE DAILY IN THE MORNING 15 pen 3  . lisinopril (PRINIVIL,ZESTRIL) 40 MG tablet Take 1 tablet (40 mg total) by mouth daily. Please keep upcoming appt in March before anymore refills. Thank you 30 tablet 0   No current facility-administered medications for this visit.     Allergies: Ambien [zolpidem tartrate] and Metformin and related  Past Medical History:  Diagnosis Date  . Asthma   . CAD (coronary artery disease)    a. non obst dz by cath 2004  b. 11/12/15 which showed no obvious large vessel CAD. No PCI or clear reason for NSTEMI ( possibly hypertensive urgency?).   [name] CARDIOMYOPATHY    a. NICM, LVEF 30% 01/2011 echo; 20-25% 07/2012 echo, EF recovered by echo 06/2015.  . Congestive heart failure (CHF) ([place])   . DIABETES MELLITUS, TYPE II   . HYPERCHOLESTEROLEMIA   . HYPERTENSION   . Myocardial infarct ([place]) 10/2015  . Noncompliance   . Prostate cancer [facility])    prostate cancer  . PVCs (premature ventricular contractions)    a. s/p ablation 01-07-2013 by Dr Rayann Heman    Past Surgical History:  Procedure Laterality Date  . ABLATION  01-07-2013   RVOT PVC's ablated by Dr Rayann Heman  along anteroseptal RVOT  . CARDIAC CATHETERIZATION  2004   nonobst dz  . CARDIAC CATHETERIZATION N/A 11/12/2015   Procedure: Left Heart Cath and Coronary Angiography;  Surgeon: Sherren Mocha, MD;  Location: [place] CV LAB;  Service: Cardiovascular;  Laterality: N/A;  . CHOLECYSTECTOMY N/A 11/16/2013   Procedure: LAPAROSCOPIC CHOLECYSTECTOMY WITH INTRAOPERATIVE CHOLANGIOGRAM;  Surgeon: [name], MD;  [facility];  Service: General;  Laterality: N/A;  .  LYMPHADENECTOMY Bilateral 02/22/2014   Procedure: PELVIC LYMPH NODE DISSECTION;  Surgeon: [name], MD;  Location: [facility];  Service: Urology;  Laterality: Bilateral;  . PROSTATE SURGERY    . ROBOT ASSISTED LAPAROSCOPIC RADICAL PROSTATECTOMY N/A 02/22/2014   Procedure: ROBOTIC ASSISTED LAPAROSCOPIC RADICAL PROSTATECTOMY WITH INDOCYANINE GREEN DYE AND OPEN UMBILICAL HERNIA REPAIR;  Surgeon: [name], MD;  [facility];  Service: Urology;  Laterality: N/A;  . SUPRAVENTRICULAR TACHYCARDIA ABLATION N/A 09/28/2012   Procedure: Tanna Furry Ablation;  Surgeon: [name], MD;  Location: [facility] CATH LAB;  Service: Cardiovascular;  Laterality: N/A;  . SURGERY SCROTAL / TESTICULAR     at age 46  . V-TACH ABLATION N/A 01/07/2013   Procedure: V-TACH ABLATION;  Surgeon: [name], MD;  Location: [place] CATH LAB;  Service: Cardiovascular;  Laterality: N/A;    Family History  Problem Relation Age of Onset  . Hypertension Mother   . Peripheral vascular disease Mother   . Diabetes Mother   . Peripheral vascular disease Other   . Hypertension Other   . Prostate cancer Other   . Prostate cancer Brother   . Lung cancer Brother   . Heart attack Paternal Grandfather   . Heart failure Maternal Grandmother   . Diabetes Son   . Colon cancer Sister   . Prostate cancer Brother   . Prostate cancer Brother   . Prostate cancer Brother   . Pancreatic cancer Paternal Aunt   . Esophageal cancer Neg Hx     Social History   Tobacco Use  . Smoking status: Never Smoker  . Smokeless tobacco: Never Used  . Tobacco comment: works for [employer]  Substance Use Topics  . Alcohol use: Yes    Comment: rare     Subjective:  Mr [name] is here today for annual check up, requesting refill of all of his medications I first saw him in 2018, he did not return until now, he tells me that due to his work schedule 7 days per week hes just not been able to get back in, has also not seen  endocrinology or cardiology since 2018, was following with specialists for uncontrolled diabetes, CHF, h/o NSTEMI, tells me that has been taking all medications daily as written, just now running out of his medications and when he called for refills was instructed to come in. He says he feels well today, does not have any complaints  CPE-  Last dental exam: no regular dental cleanings Last vision exam: no regular eye exam PSA: hx prostatectomy Lung ca screening: never a smoker Colonoscopy: up to date Lipids: lipid panel ordered DM screening- A1c ordered Vaccinations: flu shot today Diet and exercise: no regular diet or exercise   HLD- maintained on atorvastatin 80 Reports daily medication compliance without adverse medication effects.  Lab Results  Component Value Date   CHOL 177 11/12/2015   HDL 40 (L) 11/12/2015   LDLCALC 117 (H) 11/12/2015   LDLDIRECT 148.8 04/25/2009   TRIG 102 11/12/2015   CHOLHDL 4.4 11/12/2015  Hypertension -maintained on lisinopril 40 daily, hctz 25 daily, carvedilol 12.5 BID Reports daily medication compliance without adverse medication effects.  BP Readings from Last 3 Encounters:  03/12/18 140/90  03/11/18 (!) 156/92  02/08/17 109/73   Diabetes- maintained on lantus 50 pm, taking nightly, but recent glucose readings have been high in the 300s  Lab Results  Component Value Date   HGBA1C 9.8 12/09/2016    Review of Systems  Constitutional: Negative for chills and fever.  HENT: Negative for hearing loss.   Eyes: Negative for blurred vision and double vision.  Respiratory: Negative for cough and shortness of breath.   Cardiovascular: Negative for chest pain, palpitations and leg swelling.  Gastrointestinal: Negative for abdominal pain, blood in stool, constipation, diarrhea, heartburn, nausea and vomiting.  Genitourinary: Negative for dysuria and hematuria.  Musculoskeletal: Negative for falls.  Skin: Negative for rash.  Neurological:  Negative for speech change, loss of consciousness, weakness and headaches.  Endo/Heme/Allergies: Does not bruise/bleed easily.  Psychiatric/Behavioral: Negative for depression. The patient is not nervous/anxious.    Objective:  Vitals:   03/12/18 0818  BP: 140/90  Pulse: 69  SpO2: 97%  Weight: 185 lb (83.9 kg)  Height: 5\' 5"  (1.651 m)    General: Well developed, well nourished, in no acute distress  Skin : Warm and dry.  Head: Normocephalic and atraumatic  Eyes: Sclera and conjunctiva clear; pupils round and reactive to light; extraocular movements intact  Ears: External normal; canals clear; tympanic membranes normal  Oropharynx: Pink, supple. No suspicious lesions; poor dentition Neck: Supple without thyromegaly, adenopathy  Lungs: Respirations unlabored; clear to auscultation bilaterally without wheeze, rales, rhonchi  CVS exam: normal rate and regular rhythm, S1 and S2 normal.  Abdomen: Soft; nontender; nondistended; normoactive bowel sounds; no masses or hepatosplenomegaly  Musculoskeletal: No deformities; no active joint inflammation  Extremities: No edema, cyanosis, clubbing  Vessels: Symmetric bilaterally  Neurologic: Alert and oriented; speech intact; face symmetrical; moves all extremities well; CNII-XII intact without focal deficit  Psychiatric: Normal mood and affect.  Assessment:  1. Need for influenza vaccination   2. HTN (hypertension), malignant   3. NSTEMI (non-ST elevated myocardial infarction) ([place])   4. Uncontrolled type 2 diabetes mellitus with hyperglycemia ([place])   5. HYPERCHOLESTEROLEMIA   6. Chronic systolic heart failure ([place])     Plan:  Medication refills sent with strict instructions to keep cardiology follow up Suspect his diabetes remains uncontrolled, will have him follow back up with endocrinology as well We discussed the importance of regular PCP/specialty follow up for monitoring of chronic conditions/optimal outcomes and routine health  maintenance, he verbalizes understanding  Return in about 1 year (around 03/13/2019) for CPE.  No orders of the defined types were placed in this encounter.   Requested Prescriptions   Pending Prescriptions Disp Refills  . lisinopril (PRINIVIL,ZESTRIL) 40 MG tablet 30 tablet 0    Sig: Take 1 tablet (40 mg total) by mouth daily.  . hydrochlorothiazide (HYDRODIURIL) 25 MG tablet 30 tablet 0    Sig: Take 1 tablet (25 mg total) by mouth daily.  . clopidogrel (PLAVIX) 75 MG tablet 30 tablet 0    Sig: Take 1 tablet (75 mg total) by mouth daily.  . carvedilol (COREG) 12.5 MG tablet 30 tablet 0    Sig: TAKE 1 TABLET BY MOUTH TWICE DAILY WITH A MEAL  . atorvastatin (LIPITOR) 80 MG tablet 90 tablet 0    Sig: Take 1 tablet (80 mg total) by mouth daily.  [name]  isosorbide mononitrate (IMDUR) 60 MG 24 hr tablet 30 tablet     Sig: Take 1 tablet (60 mg total) by mouth daily.

## 2018-03-12 NOTE — Assessment & Plan Note (Signed)
Continue current medications Update labs- he will return when fasting - CBC; Future - Comprehensive metabolic panel; Future - Hemoglobin A1c; Future - Lipid panel; Future

## 2018-03-12 NOTE — Assessment & Plan Note (Signed)
Reviewed annual screening exams, healthy lifestyle,  additional information provided on AVS - CBC; Future - Comprehensive metabolic panel; Future - Hemoglobin A1c; Future - Lipid panel; Future - Hepatitis C antibody; Future - HIV Antibody (routine testing w rflx); Future  Encounter for hepatitis C virus screening test for high risk patient- Hepatitis C antibody; Future  Screening for HIV (human immunodeficiency virus)- HIV Antibody (routine testing w rflx); Future  Need for influenza vaccination- Flu Vaccine QUAD 36+ mos IM

## 2018-03-12 NOTE — Assessment & Plan Note (Signed)
Stable Continue current medications Continue regular follow up with cardiology Update annual labs - CBC; Future - Comprehensive metabolic panel; Future - Lipid panel; Future

## 2018-03-12 NOTE — Assessment & Plan Note (Signed)
Update labs Instructed to schedule F/U with endocrinologist - CBC; Future - Comprehensive metabolic panel; Future - Hemoglobin A1c; Future - Lipid panel; Future

## 2018-03-13 LAB — HEPATITIS C ANTIBODY
Hepatitis C Ab: NONREACTIVE
SIGNAL TO CUT-OFF: 0.03 (ref <1.00)

## 2018-03-13 LAB — HIV ANTIBODY (ROUTINE TESTING W REFLEX): HIV 1&2 Ab, 4th Generation: NONREACTIVE

## 2018-03-29 ENCOUNTER — Encounter: Payer: Self-pay | Admitting: Cardiology

## 2018-03-29 ENCOUNTER — Ambulatory Visit (INDEPENDENT_AMBULATORY_CARE_PROVIDER_SITE_OTHER): Payer: BLUE CROSS/BLUE SHIELD | Admitting: Cardiology

## 2018-03-29 VITALS — BP 171/97 | HR 64 | Ht 65.0 in | Wt 191.4 lb

## 2018-03-29 DIAGNOSIS — R0602 Shortness of breath: Secondary | ICD-10-CM | POA: Diagnosis not present

## 2018-03-29 DIAGNOSIS — I2510 Atherosclerotic heart disease of native coronary artery without angina pectoris: Secondary | ICD-10-CM | POA: Diagnosis not present

## 2018-03-29 DIAGNOSIS — I10 Essential (primary) hypertension: Secondary | ICD-10-CM

## 2018-03-29 DIAGNOSIS — N182 Chronic kidney disease, stage 2 (mild): Secondary | ICD-10-CM

## 2018-03-29 DIAGNOSIS — Z794 Long term (current) use of insulin: Secondary | ICD-10-CM

## 2018-03-29 DIAGNOSIS — E1122 Type 2 diabetes mellitus with diabetic chronic kidney disease: Secondary | ICD-10-CM | POA: Diagnosis not present

## 2018-03-29 MED ORDER — CARVEDILOL 25 MG PO TABS: 25.0000 mg | ORAL_TABLET | Freq: Two times a day (BID) | ORAL | 3 refills | Status: DC

## 2018-03-29 NOTE — Patient Instructions (Addendum)
Your physician has recommended you make the following change in your medication: INCREASE CARVEDILOL TO 25 MG TWICE DAILY  Your physician recommends that you return for lab work in:  [place] BNP   Your physician recommends that you schedule a follow-up appointment in: 2 WEEKS  WITH NP OR PA  OR DR Marlou Porch

## 2018-03-29 NOTE — Progress Notes (Signed)
Cardiology Office Note   Date:  03/29/2018   ID:  [name], DOB 1955/10/31, MRN [id number]  PCP:  Lance Sell, NP  Cardiologist:  Dr. Marlou Porch    Chief Complaint  Patient presents with  . Coronary Artery Disease  . Hypertension  . Shortness of Breath      History of Present Illness: [name] is a 63 y.o. male who presents for  CAD and last seen 12/23/16  He has a hx of with a history of HTN, PVC s/p RVOT ablation, chronic systolic HF, CKD stage III, h/o nonobstructive CAD, and borderline DM II who presented to [facility] on 11/11/15 with chest pain. He ruled in for NSTEMI  His last cardiac cath in 2005 showed nonobstructive CAD with 25% D1 stenosis, 25% ramus stenosis. He had a 48-hour Holter monitor on 08/25/2012 that showed 39% monomorphic PVCs, he was referred to Dr. Rayann Heman. He was subsequently taken for PVC mapping followed by potential ablation, however he was not having enough PVCs to map, therefore no ablation was done. He underwent a second PVC ablation by Dr. Rayann Heman on 01/07/2013. He had 24-hour Holter monitor in January 2015 that showed decreased PVC burden. He had a history of nonischemic cardiomyopathy, his EF was 20-25% in July 2014. By February 2015, echocardiogram showed EF 40-45%. He was diagnosed with prostate cancer near the end of 2015 and early 2016 and underwent robotic prostatectomy on 02/24/2014.  He was last seen by Dr. Rayann Heman on 05/11/2015 at which times he was doing well after ablation, therefore no further EP workup was planned. He was admitted on 07/12/2015 for dyspnea and hyperglycemia with blood glucose greater than 700. During the hospitalization, he had mildly elevated troponin of 0.04 which stayed the same on repeat and was instructed to follow-up with cardiology as outpatient. The mildly elevated troponin was completely flat and consistent with demand ischemia. He was discharged on 6/16, however readmitted on 6/22 with dyspnea. He also had  acute on chronic renal insufficiency which was suspected to be related to dehydration. D-dimer was negative. Echo 07/19/2015 shows EF has improved to 55-60%, grade 1 DD.   He was seen in the office in 07/2015 for evaluation of dyspnea. Plan was for exercise myoview, which I don't see was ever completed.   He was seen in the [facility] ER on 11/10/15 with chest pain. He ruled out for MI and was discharged home. He returned on 11/11/15 with recurrent chest pain felt to be both atypical and typical. ECG with ischemic changes, especially in anterolateral leads.He was quite hypertensive.He was admitted for further work up. Initially, plan was for [facility], but he ruled in for MI with peak troponin ~14 and cardiology consulted.He was transferred to [facility], admitted to cardiology service and set up for heart cath.  Interestingly, heart cath did not show any obstructive disease.  No PCI performed.  His blood pressure was improved and he was discharged.  12/23/16- he has been doing quite well.  He was frustrated at his elevated blood pressure today but normally it has been running in the good range.  No chest pain, no shortness of breath, no syncope.  Admits to medication compliance.  We discussed his previously reduced ejection fraction and improvement.  He has severe LV concentric hypertrophy EF in 2017 was 60-65%.    Today he has increased DOE, none sitting or sleeping.  But with exertion he becomes dyspneic. No chest pain.  No palpitations. His BP is elevated.  171/97.   Past Medical History:  Diagnosis Date  . Asthma   . CAD (coronary artery disease)    a. non obst dz by cath 2004  b. 11/12/15 which showed no obvious large vessel CAD. No PCI or clear reason for NSTEMI ( possibly hypertensive urgency?).   [name] CARDIOMYOPATHY    a. NICM, LVEF 30% 01/2011 echo; 20-25% 07/2012 echo, EF recovered by echo 06/2015.  . Congestive heart failure (CHF) ([place])   . DIABETES MELLITUS, TYPE II   . HYPERCHOLESTEROLEMIA     . HYPERTENSION   . Myocardial infarct ([place]) 10/2015  . Noncompliance   . Prostate cancer [facility])    prostate cancer  . PVCs (premature ventricular contractions)    a. s/p ablation 01-07-2013 by Dr Rayann Heman    Past Surgical History:  Procedure Laterality Date  . ABLATION  01-07-2013   RVOT PVC's ablated by Dr Rayann Heman along anteroseptal RVOT  . CARDIAC CATHETERIZATION  2004   nonobst dz  . CARDIAC CATHETERIZATION N/A 11/12/2015   Procedure: Left Heart Cath and Coronary Angiography;  Surgeon: Sherren Mocha, MD;  Location: [place] CV LAB;  Service: Cardiovascular;  Laterality: N/A;  . CHOLECYSTECTOMY N/A 11/16/2013   Procedure: LAPAROSCOPIC CHOLECYSTECTOMY WITH INTRAOPERATIVE CHOLANGIOGRAM;  Surgeon: [name], MD;  [facility];  Service: General;  Laterality: N/A;  . LYMPHADENECTOMY Bilateral 02/22/2014   Procedure: PELVIC LYMPH NODE DISSECTION;  Surgeon: [name], MD;  [facility];  Service: Urology;  Laterality: Bilateral;  . PROSTATE SURGERY    . ROBOT ASSISTED LAPAROSCOPIC RADICAL PROSTATECTOMY N/A 02/22/2014   Procedure: ROBOTIC ASSISTED LAPAROSCOPIC RADICAL PROSTATECTOMY WITH INDOCYANINE GREEN DYE AND OPEN UMBILICAL HERNIA REPAIR;  Surgeon: [name], MD;  [facility];  Service: Urology;  Laterality: N/A;  . SUPRAVENTRICULAR TACHYCARDIA ABLATION N/A 09/28/2012   Procedure: Tanna Furry Ablation;  Surgeon: [name], MD;  Location: [facility] CATH LAB;  Service: Cardiovascular;  Laterality: N/A;  . SURGERY SCROTAL / TESTICULAR     at age 63  . V-TACH ABLATION N/A 01/07/2013   Procedure: V-TACH ABLATION;  Surgeon: [name], MD;  Location: [place] CATH LAB;  Service: Cardiovascular;  Laterality: N/A;     Current Outpatient Medications  Medication Sig Dispense Refill  . aspirin EC 81 MG EC tablet Take 1 tablet (81 mg total) by mouth daily.    [name] atorvastatin (LIPITOR) 80 MG tablet Take 1 tablet (80 mg total) by mouth daily. 90 tablet 0  . clopidogrel (PLAVIX)  75 MG tablet Take 1 tablet (75 mg total) by mouth daily. 30 tablet 0  . hydrochlorothiazide (HYDRODIURIL) 25 MG tablet Take 1 tablet (25 mg total) by mouth daily. 30 tablet 0  . Insulin Glargine (LANTUS) 100 UNIT/ML Solostar Pen Inject 60 Units into the skin every morning. And pen needles 2/day 30 mL 11  . isosorbide mononitrate (IMDUR) 60 MG 24 hr tablet Take 1 tablet (60 mg total) by mouth daily. 30 tablet 0  . LANTUS SOLOSTAR 100 UNIT/ML Solostar Pen INJECT 50 UNITS SUBCUTANEOUSLY ONCE DAILY IN THE MORNING 15 pen 3  . lisinopril (PRINIVIL,ZESTRIL) 40 MG tablet Take 1 tablet (40 mg total) by mouth daily. 30 tablet 0  . carvedilol (COREG) 25 MG tablet Take 1 tablet (25 mg total) by mouth 2 (two) times daily with a meal. 180 tablet 3   No current facility-administered medications for this visit.     Allergies:   Ambien [zolpidem tartrate] and Metformin and related    Social History:  The  patient  reports that he has never smoked. He has never used smokeless tobacco. He reports current alcohol use. He reports that he does not use drugs.   Family History:  The patient's family history includes Colon cancer in his sister; Diabetes in his mother and son; Heart attack in his paternal grandfather; Heart failure in his maternal grandmother; Hypertension in his mother and another family member; Lung cancer in his brother; Pancreatic cancer in his paternal aunt; Peripheral vascular disease in his mother and another family member; Prostate cancer in his brother, brother, brother, brother and another family member.    ROS:  General:no colds or fevers, no weight changes Skin:no rashes or ulcers HEENT:no blurred vision, no congestion CV:see HPI PUL:see HPI GI:no diarrhea constipation or melena, no indigestion GU:no hematuria, no dysuria MS:no joint pain, no claudication Neuro:no syncope, no lightheadedness Endo:+ diabetes, no thyroid disease  Wt Readings from Last 3 Encounters:  03/29/18 191 lb 6.4  oz (86.8 kg)  03/12/18 185 lb (83.9 kg)  03/11/18 185 lb 4 oz (84 kg)     PHYSICAL EXAM: VS:  BP (!) 171/97   Pulse 64   Ht 5\' 5"  (1.651 m)   Wt 191 lb 6.4 oz (86.8 kg)   SpO2 97%   BMI 31.85 kg/m  , BMI Body mass index is 31.85 kg/m. General:Pleasant affect, NAD Skin:Warm and dry, brisk capillary refill HEENT:normocephalic, sclera clear, mucus membranes moist Neck:supple, no JVD, no bruits  Heart:S1S2 RRR without murmur, gallup, rub or click Lungs:clear without rales, rhonchi, or wheezes PPI:RJJO, non tender, + BS, do not palpate liver spleen or masses Ext:no lower ext edema, 2+ pedal pulses, 2+ radial pulses Neuro:alert and oriented X 3, MAE, follows commands, + facial symmetry    EKG:  EKG is ordered today. The ekg ordered today demonstrates SR with LAD, LVH and inverted T waves inf. Ant lateral.  This is new to recent EKGs but he has had same pattern in the past, reviewed with Dr. Marlou Porch and due to hypertrophic CM.     Recent Labs: 03/12/2018: ALT 18; BUN 20; Creatinine, Ser 1.32; Hemoglobin 12.9; Platelets 301.0; Potassium 4.5; Sodium 140    Lipid Panel    Component Value Date/Time   CHOL 175 03/12/2018 0920   TRIG 100.0 03/12/2018 0920   TRIG 200 09/20/2007   HDL 37.60 (L) 03/12/2018 0920   CHOLHDL 5 03/12/2018 0920   VLDL 20.0 03/12/2018 0920   LDLCALC 118 (H) 03/12/2018 0920   LDLDIRECT 148.8 04/25/2009 0000       Other studies Reviewed: Additional studies/ records that were reviewed today include: .  11/12/15 Left Heart Cath and Coronary Angiography  Conclusion     Mid RCA lesion, 30 %stenosed.  Mid LAD lesion, 25 %stenosed.  1st Diag lesion, 30 %stenosed.  There is hyperdynamic left ventricular systolic function.  The left ventricular ejection fraction is greater than 65% by visual estimate.  1. Mild nonobstructive CAD as outlined, with mild plaque in the mid-RCA, mid-LAD, and first diagonal branch 2. Vigorous LV systolic function with  normal LVEDP  No clear explanation for NSTEMI. Hypertensive emergency is a possibility. There is a tiny branch collateral possibly supplying the distal circumflex distribution from the RCA - it is possible this caused NSTEMI but the vessel is very small and occlusion is not visualized.     _____________  Echo: 07/19/2015 LV EF: 55% - 60% Study Conclusion - Left ventricle: The cavity size was mildly dilated. There was severe concentric hypertrophy.  Systolic function was normal. The estimated ejection fraction was in the range of 55% to 60%. Wall motion was normal; there were no regional wall motion abnormalities. Doppler parameters are consistent with abnormal left ventricular relaxation (grade 1 diastolic dysfunction). - Left atrium: The atrium was mildly dilated    ASSESSMENT AND PLAN:  1.  SOB will check BNP, - discussed with Dr. Marlou Porch, most likely due to HTN and his LV hypertrophy.  First goal will be to control BP -will increase coreg to 25 BID.   Will have him seen in 2 weeks for further adjustment if needed.  If SOB continues then would repeat echo.  On his last cath he had minimal CAD and at that time NSTEMI.    2.  HTN uncontrolled.  See above.  3.  Nonobstructive CAD on cath in 2017.  While pt is diabetic, would hold off on cath unless dyspnea continues despite BP control.   4.  DM-2 with CKD and long term use of insulin per PCP.    5.  Abnormal EKG secondary to severe concentric hypertrophy.    Current medicines are reviewed with the patient today.  The patient Has no concerns regarding medicines.  The following changes have been made:  See above Labs/ tests ordered today include:see above  Disposition:   FU:  see above  Signed, [name], NP  03/29/2018 5:16 PM    [facility] [place], [address] [place], [place], [place] Phone: [phone]; Fax: [phone]

## 2018-03-30 LAB — BASIC METABOLIC PANEL
BUN/Creatinine Ratio: 9 — ABNORMAL LOW (ref 10–24)
BUN: 11 mg/dL (ref 8–27)
CO2: 22 mmol/L (ref 20–29)
Calcium: 9.6 mg/dL (ref 8.6–10.2)
Chloride: 104 mmol/L (ref 96–106)
Creatinine, Ser: 1.23 mg/dL (ref 0.76–1.27)
GFR calc Af Amer: 72 mL/min/{1.73_m2} (ref >59)
GFR calc non Af Amer: 62 mL/min/{1.73_m2} (ref >59)
Glucose: 165 mg/dL — ABNORMAL HIGH (ref 65–99)
Potassium: 3.9 mmol/L (ref 3.5–5.2)
Sodium: 141 mmol/L (ref 134–144)

## 2018-03-30 LAB — PRO B NATRIURETIC PEPTIDE: NT-Pro BNP: 129 pg/mL (ref 0–210)

## 2018-04-20 ENCOUNTER — Telehealth: Payer: Self-pay | Admitting: Nurse Practitioner

## 2018-04-20 NOTE — Telephone Encounter (Signed)
Pt does not know who called him. Pt last seen by [name], NP

## 2018-04-20 NOTE — Telephone Encounter (Signed)
Follow Up:     Pt was retuning a call, but does not know who called him.

## 2018-04-21 NOTE — Telephone Encounter (Signed)
Several messages left for patient to call us back to discuss his upcoming appointment and change to virtual visit or reschedule.   [name], RN, [place] [address] [place], [place]  [id number] [phone]

## 2018-04-21 NOTE — Telephone Encounter (Signed)
Follow up:    Patient calling stating that some one called him on yesterday and do not sure who. Please call patient.

## 2018-04-27 ENCOUNTER — Telehealth: Payer: Self-pay | Admitting: Nurse Practitioner

## 2018-04-27 ENCOUNTER — Telehealth (INDEPENDENT_AMBULATORY_CARE_PROVIDER_SITE_OTHER): Payer: BLUE CROSS/BLUE SHIELD | Admitting: Nurse Practitioner

## 2018-04-27 ENCOUNTER — Other Ambulatory Visit: Payer: Self-pay

## 2018-04-27 ENCOUNTER — Other Ambulatory Visit: Payer: Self-pay | Admitting: Endocrinology

## 2018-04-27 ENCOUNTER — Encounter: Payer: Self-pay | Admitting: Nurse Practitioner

## 2018-04-27 VITALS — BP 149/88 | HR 75

## 2018-04-27 DIAGNOSIS — I10 Essential (primary) hypertension: Secondary | ICD-10-CM

## 2018-04-27 DIAGNOSIS — I2510 Atherosclerotic heart disease of native coronary artery without angina pectoris: Secondary | ICD-10-CM | POA: Diagnosis not present

## 2018-04-27 DIAGNOSIS — R0602 Shortness of breath: Secondary | ICD-10-CM

## 2018-04-27 DIAGNOSIS — I428 Other cardiomyopathies: Secondary | ICD-10-CM | POA: Diagnosis not present

## 2018-04-27 MED ORDER — HYDROCHLOROTHIAZIDE 25 MG PO TABS: 25.0000 mg | ORAL_TABLET | Freq: Every day | ORAL | 6 refills | Status: DC

## 2018-04-27 MED ORDER — CLOPIDOGREL BISULFATE 75 MG PO TABS: 75.0000 mg | ORAL_TABLET | Freq: Every day | ORAL | 6 refills | Status: DC

## 2018-04-27 MED ORDER — LISINOPRIL 40 MG PO TABS: 40.0000 mg | ORAL_TABLET | Freq: Every day | ORAL | 6 refills | Status: DC

## 2018-04-27 MED ORDER — ISOSORBIDE MONONITRATE ER 60 MG PO TB24: 90.0000 mg | ORAL_TABLET | Freq: Every day | ORAL | 6 refills | Status: DC

## 2018-04-27 NOTE — Progress Notes (Signed)
Virtual Visit via Telephone Note    Evaluation Performed:  Follow-up visit  This visit type was conducted due to national recommendations for restrictions regarding the COVID-19 Pandemic (e.g. social distancing).  This format is felt to be most appropriate for this patient at this time.  All issues noted in this document were discussed and addressed.  No physical exam was performed (except for noted visual exam findings with Video Visits).  Please refer to the patient's chart (MyChart message for video visits and phone note for telephone visits) for the patient's consent to telehealth for [facility].  Date:  04/27/2018   ID:  [name], DOB 1955/08/06, MRN [id number]  Patient Location:  Home  Provider location:   Home  PCP:  Lance Sell, NP  Cardiologist:  [facility] Electrophysiologist:  None   Chief Complaint:  Follow up for shortness of breath.   History of Present Illness:    [name] is a 63 y.o. male who presents via audio/video conferencing for a telehealth visit today.  He is seen for Dr. Marlou Porch.   He has a history of HTN, PVC s/p RVOT ablation ([place]), NICM, chronic systolic HF, CKD stage III, h/o nonobstructive CAD, and borderline DM II. He had a NSTEMI in 2017. His EF was 20-25% in July 2014. By February 2015, echocardiogram showed EF 40-45%. He was diagnosed with prostate cancer in 2015 and underwent robotic prostatectomy on 02/24/2014.  He was admitted on 07/12/2015 for dyspnea and hyperglycemia with blood glucose greater than 700. During the hospitalization, he had mildly elevated troponin of 0.04 which stayed the same on repeat and was instructed to follow-up with cardiology as outpatient. The mildly elevated troponin was completely flat and consistent with demand ischemia. He was discharged on 6/16, however readmitted on 6/22 with dyspnea. He also had acute on chronic renal insufficiency which was suspected to be related to dehydration. D-dimer  was negative. Echo 07/19/2015 shows EF has improved to 55-60%, grade 1 DD. He was referred for a Myoview at that - however never completed. Then ended up back in the ER later in 2017 with chest pain - + troponin - was recathed - no obstructive disease.   He was last seen by Dr. Marlou Porch in November of 2018.   Seen earlier this month by [name], NP with increased DOE - BP was elevated - felt to be related to LVH and elevated BP. Coreg was increased. To consider updating echo if failed to improve.   The patient does not symptoms concerning for COVID-19 infection (fever, chills, cough, or new shortness of breath).   Discussed with patient today over the phone. Verbal consent for the conversation was given. He is doing ok. He feels a bit better but still with some DOE if he "really exerts". Steps make him short of breath and so he avoids. No real exercise program other than his ADL's. He does tire easily - this seems chronic. Not dizzy or lightheaded. No syncope. He tries to restrict his salt. BP still elevated. No actual chest pain. No bleeding/bruising. He needs several of his medicines refilled.   Prior CV studies:   The following studies were reviewed today:  11/12/15 Left Heart Cath and Coronary Angiography  Conclusion     Mid RCA lesion, 30 %stenosed.  Mid LAD lesion, 25 %stenosed.  1st Diag lesion, 30 %stenosed.  There is hyperdynamic left ventricular systolic function.  The left ventricular ejection fraction is greater than 65% by visual estimate.  1. Mild nonobstructive CAD as outlined, with mild plaque in the mid-RCA, mid-LAD, and first diagonal branch 2. Vigorous LV systolic function with normal LVEDP  No clear explanation for NSTEMI. Hypertensive emergency is a possibility. There is a tiny branch collateral possibly supplying the distal circumflex distribution from the RCA - it is possible this caused NSTEMI but the vessel is very small and occlusion is not  visualized.     _____________  Echo: 07/19/2015 LV EF: 55% - 60% Study Conclusion - Left ventricle: The cavity size was mildly dilated. There was severe concentric hypertrophy. Systolic function was normal. The estimated ejection fraction was in the range of 55% to 60%. Wall motion was normal; there were no regional wall motion abnormalities. Doppler parameters are consistent with abnormal left ventricular relaxation (grade 1 diastolic dysfunction). - Left atrium: The atrium was mildly dilated   Past Medical History:  Diagnosis Date  . Asthma   . CAD (coronary artery disease)    a. non obst dz by cath 2004  b. 11/12/15 which showed no obvious large vessel CAD. No PCI or clear reason for NSTEMI ( possibly hypertensive urgency?).   [name] CARDIOMYOPATHY    a. NICM, LVEF 30% 01/2011 echo; 20-25% 07/2012 echo, EF recovered by echo 06/2015.  . Congestive heart failure (CHF) ([place])   . DIABETES MELLITUS, TYPE II   . HYPERCHOLESTEROLEMIA   . HYPERTENSION   . Myocardial infarct ([place]) 10/2015  . Noncompliance   . Prostate cancer [facility])    prostate cancer  . PVCs (premature ventricular contractions)    a. s/p ablation 01-07-2013 by Dr Rayann Heman   Past Surgical History:  Procedure Laterality Date  . ABLATION  01-07-2013   RVOT PVC's ablated by Dr Rayann Heman along anteroseptal RVOT  . CARDIAC CATHETERIZATION  2004   nonobst dz  . CARDIAC CATHETERIZATION N/A 11/12/2015   Procedure: Left Heart Cath and Coronary Angiography;  Surgeon: Sherren Mocha, MD;  Location: [place] CV LAB;  Service: Cardiovascular;  Laterality: N/A;  . CHOLECYSTECTOMY N/A 11/16/2013   Procedure: LAPAROSCOPIC CHOLECYSTECTOMY WITH INTRAOPERATIVE CHOLANGIOGRAM;  Surgeon: [name], MD;  Location: [facility] ORS;  Service: General;  Laterality: N/A;  . LYMPHADENECTOMY Bilateral 02/22/2014   Procedure: PELVIC LYMPH NODE DISSECTION;  Surgeon: [name], MD;  Location: [facility] ORS;  Service: Urology;  Laterality:  Bilateral;  . PROSTATE SURGERY    . ROBOT ASSISTED LAPAROSCOPIC RADICAL PROSTATECTOMY N/A 02/22/2014   Procedure: ROBOTIC ASSISTED LAPAROSCOPIC RADICAL PROSTATECTOMY WITH INDOCYANINE GREEN DYE AND OPEN UMBILICAL HERNIA REPAIR;  Surgeon: [name], MD;  Location: [facility];  Service: Urology;  Laterality: N/A;  . SUPRAVENTRICULAR TACHYCARDIA ABLATION N/A 09/28/2012   Procedure: Tanna Furry Ablation;  Surgeon: [name], MD;  Location: [facility] CATH LAB;  Service: Cardiovascular;  Laterality: N/A;  . SURGERY SCROTAL / TESTICULAR     at age 70  . V-TACH ABLATION N/A 01/07/2013   Procedure: V-TACH ABLATION;  Surgeon: [name], MD;  Location: [facility] CATH LAB;  Service: Cardiovascular;  Laterality: N/A;     Current Meds  Medication Sig  . aspirin EC 81 MG EC tablet Take 1 tablet (81 mg total) by mouth daily.  [name] atorvastatin (LIPITOR) 80 MG tablet Take 1 tablet (80 mg total) by mouth daily.  . carvedilol (COREG) 25 MG tablet Take 1 tablet (25 mg total) by mouth 2 (two) times daily with a meal.  . clopidogrel (PLAVIX) 75 MG tablet Take 1 tablet (75 mg total) by mouth daily.  . hydrochlorothiazide (HYDRODIURIL) 25  MG tablet Take 1 tablet (25 mg total) by mouth daily.  . Insulin Glargine (LANTUS) 100 UNIT/ML Solostar Pen Inject 60 Units into the skin every morning. And pen needles 2/day  . isosorbide mononitrate (IMDUR) 60 MG 24 hr tablet Take 1.5 tablets (90 mg total) by mouth daily.  [name] LANTUS SOLOSTAR 100 UNIT/ML Solostar Pen INJECT 50 UNITS SUBCUTANEOUSLY ONCE DAILY IN THE MORNING  . lisinopril (PRINIVIL,ZESTRIL) 40 MG tablet Take 1 tablet (40 mg total) by mouth daily.  . [DISCONTINUED] clopidogrel (PLAVIX) 75 MG tablet Take 1 tablet (75 mg total) by mouth daily.  . [DISCONTINUED] hydrochlorothiazide (HYDRODIURIL) 25 MG tablet Take 1 tablet (25 mg total) by mouth daily.  . [DISCONTINUED] isosorbide mononitrate (IMDUR) 60 MG 24 hr tablet Take 1 tablet (60 mg total) by mouth daily.  . [DISCONTINUED]  lisinopril (PRINIVIL,ZESTRIL) 40 MG tablet Take 1 tablet (40 mg total) by mouth daily.     Allergies:   Ambien [zolpidem tartrate] and Metformin and related   Social History   Tobacco Use  . Smoking status: Never Smoker  . Smokeless tobacco: Never Used  . 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 Alcohol use: Yes    Comment: rare  . Drug use: No     Family Hx: The patient's family history includes Colon cancer in his sister; Diabetes in his mother and son; Heart attack in his paternal grandfather; Heart failure in his maternal grandmother; Hypertension in his mother and another family member; Lung cancer in his brother; Pancreatic cancer in his paternal aunt; Peripheral vascular disease in his mother and another family member; Prostate cancer in his brother, brother, brother, brother and another family member. There is no history of Esophageal cancer.  ROS:   Please see the history of present illness.  All other systems reviewed and are negative.   Labs/Other Tests and Data Reviewed:    Recent Labs: 03/12/2018: ALT 18; Hemoglobin 12.9; Platelets 301.0 03/29/2018: BUN 11; Creatinine, Ser 1.23; NT-Pro BNP 129; Potassium 3.9; Sodium 141   Recent Lipid Panel Lab Results  Component Value Date/Time   CHOL 175 03/12/2018 09:20 AM   TRIG 100.0 03/12/2018 09:20 AM   TRIG 200 09/20/2007   HDL 37.60 (L) 03/12/2018 09:20 AM   CHOLHDL 5 03/12/2018 09:20 AM   LDLCALC 118 (H) 03/12/2018 09:20 AM   LDLDIRECT 148.8 04/25/2009 12:00 AM    Wt Readings from Last 3 Encounters:  03/29/18 191 lb 6.4 oz (86.8 kg)  03/12/18 185 lb (83.9 kg)  03/11/18 185 lb 4 oz (84 kg)     Objective:    Vital Signs:  BP (!) 149/88   Pulse 75    Alert and responsive. Answers appropriately. No shortness of breath noted with conversation.   ASSESSMENT & PLAN:    1.  SOB -  with over exertion - could be anginal equivalent but also chronic issue and  probably multifactorial in nature. BP still up - will increase his Imdur to 90 mg a day.   2.  HTN - known LVH - hesitant to add Aldactone at this time. Could consider Hydralazine in the future. Will try increasing the Imdur - see if this helps his breathing.    3.  Nonobstructive CAD on cath in 2017 - managed medically with CV risk factor modification. No overt chest pain but fatigue/DOE may be anginal equivalents.   4.  DM-2 with CKD and long term use of insulin per  PCP.    5.  Known abnormal EKG secondary to severe concentric hypertrophy.   6. COVID-19 Education: The signs and symptoms of COVID-19 were discussed with the patient and how to seek care for testing (follow up with PCP or arrange E-visit).  The importance of social distancing/staying at home and hand hygiene were discussed today.  Patient Risk:   After full review of this patient's clinical status, I feel that they are at least moderate risk at this time.  Time:   Today, I have spent 15 minutes with the patient with telehealth technology discussing the above issues.     Medication Adjustments/Labs and Tests Ordered: Current medicines are reviewed at length with the patient today.  Concerns regarding medicines are outlined above.   Tests Ordered: No orders of the defined types were placed in this encounter.  Medication Changes: Meds ordered this encounter  Medications  . lisinopril (PRINIVIL,ZESTRIL) 40 MG tablet    Sig: Take 1 tablet (40 mg total) by mouth daily.    Dispense:  30 tablet    Refill:  6    Order Specific Question:   Supervising Provider    Answer:   [place], PETER M [phone]  . isosorbide mononitrate (IMDUR) 60 MG 24 hr tablet    Sig: Take 1.5 tablets (90 mg total) by mouth daily.    Dispense:  45 tablet    Refill:  6    Order Specific Question:   Supervising Provider    Answer:   [place], PETER M [phone]  . hydrochlorothiazide (HYDRODIURIL) 25 MG tablet    Sig: Take 1 tablet (25 mg total) by  mouth daily.    Dispense:  30 tablet    Refill:  6    Order Specific Question:   Supervising Provider    Answer:   [place], PETER M [phone]  . clopidogrel (PLAVIX) 75 MG tablet    Sig: Take 1 tablet (75 mg total) by mouth daily.    Dispense:  30 tablet    Refill:  6    Order Specific Question:   Supervising Provider    Answer:   [place], PETER M [phone]    Disposition:  Follow up with [place] in about 4 weeks - could do virtual visit or office visit.   Amie Critchley, NP  04/27/2018 1:49 PM    [facility]

## 2018-04-27 NOTE — Patient Instructions (Addendum)
After Visit Summary:  We will be checking the following labs today - NONE   Medication Instructions:    Continue with your current medicines. BUT  I am increasing the Isosorbide to 90 mg a day - this will be a pill and a half each day - this was sent to your pharmacy  I have refilled your heart medicines today to your pharmacy.    If you need a refill on your cardiac medications before your next appointment, please call your pharmacy.     Testing/Procedures To Be Arranged:  N/A  Follow-Up:   See [place] in about a month - either in the office or a virtual visit.      At [facility], you and your health needs are our priority.  As part of our continuing mission to provide you with exceptional heart care, we have created designated Provider Care Teams.  These Care Teams include your primary Cardiologist (physician) and Advanced Practice Providers (APPs -  Physician Assistants and Nurse Practitioners) who all work together to provide you with the care you need, when you need it.  Special Instructions:  . Restrict your salt as best you can.  . Continue to monitor your blood pressure . Social distancing, stay at home and good hand hygiene are encouraged.   Call the [place] office at [phone] if you have any questions, problems or concerns.

## 2018-04-27 NOTE — Telephone Encounter (Signed)
Follow Up:    Pt said he just missed his call for his appt. Please call him.

## 2018-04-27 NOTE — Telephone Encounter (Signed)
Done

## 2018-05-27 ENCOUNTER — Other Ambulatory Visit: Payer: Self-pay | Admitting: Endocrinology

## 2018-05-27 NOTE — Telephone Encounter (Signed)
Please refill x 1 f/u is due 

## 2018-05-28 ENCOUNTER — Telehealth: Payer: Self-pay | Admitting: *Deleted

## 2018-05-28 NOTE — Telephone Encounter (Signed)
vm not set up.  Will try later.  Pt has had a televisit before. No consent required due too one being in system.  Will call pt back later.  Work number listed do not like to call pts work.  Will see what happens after calling later.

## 2018-05-30 NOTE — Progress Notes (Signed)
Telehealth Visit     Virtual Visit via Video Note   This visit type was conducted due to national recommendations for restrictions regarding the COVID-19 Pandemic (e.g. social distancing) in an effort to limit this patient's exposure and mitigate transmission in our community.  Due to his co-morbid illnesses, this patient is at least at moderate risk for complications without adequate follow up.  This format is felt to be most appropriate for this patient at this time.  All issues noted in this document were discussed and addressed.  A limited physical exam was performed with this format.  Please refer to the patient's chart for his consent to telehealth for [facility].   Evaluation Performed:  Follow-up visit  This visit type was conducted due to national recommendations for restrictions regarding the COVID-19 Pandemic (e.g. social distancing).  This format is felt to be most appropriate for this patient at this time.  All issues noted in this document were discussed and addressed.  No physical exam was performed (except for noted visual exam findings with Video Visits).  Please refer to the patient's chart (MyChart message for video visits and phone note for telephone visits) for the patient's consent to telehealth for [facility].  Date:  05/31/2018   ID:  [name], DOB 01/28/1955, MRN [id number]  Patient Location:  Home  Provider location:   Home  PCP:  Lance Sell, NP  Cardiologist:  [name] Electrophysiologist:  None   Chief Complaint:  Follow up   History of Present Illness:    [name] is a 63 y.o. male who presents via audio/video conferencing for a telehealth visit today.  Seen for Dr. Marlou Porch.   He has a history of nonobstructive CAD with prior NSTEMI 2017 (no intervention needed - nonobstructive disease noted), HTN, PVC s/p RVOT ablation, NICM with chronic systolic HF, CKD stage III, prostate cancer treated with robotic prostatectomy  in 2016 and DM II. EF has been as low as 20 to 25 in 2014 - now up to 55 to 60% per echo in 2017 with grade I DD and with severe LVH.   He has had issues with ongoing dyspnea. Has had elevation with his BP. Last seen by [name], NP in early March of 2020 - again for increasing DOE. BP was again elevated.  Coreg was increased at that time.   I then had a virtual visit with him at the end of March - ended up increasing his Imdur to 90 mg to help with his shortness of breath with heavy exertion.    The patient does not have symptoms concerning for COVID-19 infection (fever, chills, cough, or new shortness of breath).   Seen today via [place]. He has consented for this visit. He is doing much better. He is feeling better. His fatigue is somewhat chronic but seems improved with the increase in his nitrate therapy. No chest pain. He is walking more.  No significant headache. BP is fair today. He typically does not like to check - "too worried it will be high".  His insulin was changed earlier today by Dr. Loanne Drilling. Overall, he feels like he is doing well and has no real concerns.   Past Medical History:  Diagnosis Date  . Asthma   . CAD (coronary artery disease)    a. non obst dz by cath 2004  b. 11/12/15 which showed no obvious large vessel CAD. No PCI or clear reason for NSTEMI ( possibly hypertensive urgency?).   [name]  CARDIOMYOPATHY    a. NICM, LVEF 30% 01/2011 echo; 20-25% 07/2012 echo, EF recovered by echo 06/2015.  . Congestive heart failure (CHF) ([place])   . DIABETES MELLITUS, TYPE II   . HYPERCHOLESTEROLEMIA   . HYPERTENSION   . Myocardial infarct ([place]) 10/2015  . Noncompliance   . Prostate cancer [facility])    prostate cancer  . PVCs (premature ventricular contractions)    a. s/p ablation 01-07-2013 by Dr Rayann Heman   Past Surgical History:  Procedure Laterality Date  . ABLATION  01-07-2013   RVOT PVC's ablated by Dr Rayann Heman along anteroseptal RVOT  . CARDIAC CATHETERIZATION  2004   nonobst  dz  . CARDIAC CATHETERIZATION N/A 11/12/2015   Procedure: Left Heart Cath and Coronary Angiography;  Surgeon: Sherren Mocha, MD;  Location: [place] CV LAB;  Service: Cardiovascular;  Laterality: N/A;  . CHOLECYSTECTOMY N/A 11/16/2013   Procedure: LAPAROSCOPIC CHOLECYSTECTOMY WITH INTRAOPERATIVE CHOLANGIOGRAM;  Surgeon: [name], MD;  Location: [facility];  Service: General;  Laterality: N/A;  . LYMPHADENECTOMY Bilateral 02/22/2014   Procedure: PELVIC LYMPH NODE DISSECTION;  Surgeon: [name], MD;  Location: [facility];  Service: Urology;  Laterality: Bilateral;  . PROSTATE SURGERY    . ROBOT ASSISTED LAPAROSCOPIC RADICAL PROSTATECTOMY N/A 02/22/2014   Procedure: ROBOTIC ASSISTED LAPAROSCOPIC RADICAL PROSTATECTOMY WITH INDOCYANINE GREEN DYE AND OPEN UMBILICAL HERNIA REPAIR;  Surgeon: [name], MD;  [facility];  Service: Urology;  Laterality: N/A;  . SUPRAVENTRICULAR TACHYCARDIA ABLATION N/A 09/28/2012   Procedure: Tanna Furry Ablation;  Surgeon: [name], MD;  Location: [facility] CATH LAB;  Service: Cardiovascular;  Laterality: N/A;  . SURGERY SCROTAL / TESTICULAR     at age 9  . V-TACH ABLATION N/A 01/07/2013   Procedure: V-TACH ABLATION;  Surgeon: [name], MD;  Location: [place] CATH LAB;  Service: Cardiovascular;  Laterality: N/A;     Current Meds  Medication Sig  . aspirin EC 81 MG EC tablet Take 1 tablet (81 mg total) by mouth daily.  [name] atorvastatin (LIPITOR) 80 MG tablet Take 1 tablet (80 mg total) by mouth daily.  . carvedilol (COREG) 25 MG tablet Take 1 tablet (25 mg total) by mouth 2 (two) times daily with a meal.  . clopidogrel (PLAVIX) 75 MG tablet Take 1 tablet (75 mg total) by mouth daily.  . hydrochlorothiazide (HYDRODIURIL) 25 MG tablet Take 1 tablet (25 mg total) by mouth daily.  . Insulin Glargine (LANTUS) 100 UNIT/ML Solostar Pen Inject 70 Units into the skin every morning. And pen needles 2/day  . isosorbide mononitrate (IMDUR) 60 MG 24 hr tablet Take 1.5  tablets (90 mg total) by mouth daily.  [name] lisinopril (PRINIVIL,ZESTRIL) 40 MG tablet Take 1 tablet (40 mg total) by mouth daily.     Allergies:   Ambien [zolpidem tartrate] and Metformin and related   Social History   Tobacco Use  . Smoking status: Never Smoker  . Smokeless tobacco: Never Used  . 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 Alcohol use: Yes    Comment: rare  . Drug use: No     Family Hx: The patient's family history includes Colon cancer in his sister; Diabetes in his mother and son; Heart attack in his paternal grandfather; Heart failure in his maternal grandmother; Hypertension in his mother and another family member; Lung cancer in his brother; Pancreatic cancer in his paternal aunt; Peripheral vascular disease in his mother and another family member; Prostate cancer  in his brother, brother, brother, brother and another family member. There is no history of Esophageal cancer.  ROS:   Please see the history of present illness.   All other systems reviewed are negative.    Objective:    Vital Signs:  BP (!) 148/80   Pulse 85   Ht 5\' 5"  (1.651 m)   Wt 185 lb (83.9 kg)   BMI 30.79 kg/m    Wt Readings from Last 3 Encounters:  05/31/18 185 lb (83.9 kg)  03/29/18 191 lb 6.4 oz (86.8 kg)  03/12/18 185 lb (83.9 kg)    Alert male in no acute distress. He has missing teeth noted. Quite jovial and upbeat.    Labs/Other Tests and Data Reviewed:    Lab Results  Component Value Date   WBC 7.7 03/12/2018   HGB 12.9 (L) 03/12/2018   HCT 38.5 (L) 03/12/2018   PLT 301.0 03/12/2018   GLUCOSE 165 (H) 03/29/2018   CHOL 175 03/12/2018   TRIG 100.0 03/12/2018   HDL 37.60 (L) 03/12/2018   LDLDIRECT 148.8 04/25/2009   LDLCALC 118 (H) 03/12/2018   ALT 18 03/12/2018   AST 18 03/12/2018   NA 141 03/29/2018   K 3.9 03/29/2018   CL 104 03/29/2018   CREATININE 1.23 03/29/2018   BUN 11 03/29/2018   CO2 22 03/29/2018    TSH 0.928 07/30/2012   PSA 14.80 (H) 09/01/2013   INR 0.95 01/25/2016   HGBA1C 12.6 (H) 03/12/2018   MICROALBUR 0.9 10/15/2016     BNP (last 3 results) No results for input(s): BNP in the last 8760 hours.  ProBNP (last 3 results) Recent Labs    03/29/18 1621  PROBNP 129      Prior CV studies:    The following studies were reviewed today:  11/12/15 Left Heart Cath and Coronary Angiography  Conclusion     Mid RCA lesion, 30 %stenosed.  Mid LAD lesion, 25 %stenosed.  1st Diag lesion, 30 %stenosed.  There is hyperdynamic left ventricular systolic function.  The left ventricular ejection fraction is greater than 65% by visual estimate.  1. Mild nonobstructive CAD as outlined, with mild plaque in the mid-RCA, mid-LAD, and first diagonal branch 2. Vigorous LV systolic function with normal LVEDP  No clear explanation for NSTEMI. Hypertensive emergency is a possibility. There is a tiny branch collateral possibly supplying the distal circumflex distribution from the RCA - it is possible this caused NSTEMI but the vessel is very small and occlusion is not visualized.     _____________  Echo: 07/19/2015 LV EF: 55% - 60% Study Conclusion - Left ventricle: The cavity size was mildly dilated. There was severe concentric hypertrophy. Systolic function was normal. The estimated ejection fraction was in the range of 55% to 60%. Wall motion was normal; there were no regional wall motion abnormalities. Doppler parameters are consistent with abnormal left ventricular relaxation (grade 1 diastolic dysfunction). - Left atrium: The atrium was mildly dilated    ASSESSMENT & PLAN:    1.SOB - with over exertion - could be anginal equivalent but also chronic issue and probably multifactorial in nature. I increased his Imdur about 6 weeks ago - he reports improvement clinically. Would hold on repeat echo for now.   2. HTN - known LVH - hesitant to add  Aldactone at this time. Could still consider Hydralazine in the future. He has had clinical improvement with the increase in his nitrate.   3. Nonobstructive CAD on cath in 2017 -  managed medically with CV risk factor modification. No overt chest pain but fatigue/DOE may be anginal equivalents - see above.   4. DM-2 with CKD and long term use of insulin per PCP.  Has had changes made today.   5. Known abnormal EKG secondary to severe concentric hypertrophy.   6. COVID-19 Education: The signs and symptoms of COVID-19 were discussed with the patient and how to seek care for testing (follow up with PCP or arrange E-visit).  The importance of social distancing, staying at home, hand hygiene and wearing a mask when out in public were discussed today.  Patient Risk:   After full review of this patient's clinical status, I feel that they are at least moderate risk at this time.  Time:   Today, I have spent 10 minutes with the patient with telehealth technology discussing the above issues.     Medication Adjustments/Labs and Tests Ordered: Current medicines are reviewed at length with the patient today.  Concerns regarding medicines are outlined above.   Tests Ordered: No orders of the defined types were placed in this encounter.   Medication Changes: No orders of the defined types were placed in this encounter.   Disposition:  FU with me in about 4 months.   Patient is agreeable to this plan and will call if any problems develop in the interim.   Amie Critchley, NP  05/31/2018 3:01 PM    [facility]

## 2018-05-31 ENCOUNTER — Telehealth (INDEPENDENT_AMBULATORY_CARE_PROVIDER_SITE_OTHER): Payer: BLUE CROSS/BLUE SHIELD | Admitting: Nurse Practitioner

## 2018-05-31 ENCOUNTER — Ambulatory Visit (INDEPENDENT_AMBULATORY_CARE_PROVIDER_SITE_OTHER): Payer: BLUE CROSS/BLUE SHIELD | Admitting: Endocrinology

## 2018-05-31 ENCOUNTER — Other Ambulatory Visit: Payer: Self-pay

## 2018-05-31 ENCOUNTER — Encounter: Payer: Self-pay | Admitting: Nurse Practitioner

## 2018-05-31 VITALS — BP 148/80 | HR 85 | Ht 65.0 in | Wt 185.0 lb

## 2018-05-31 DIAGNOSIS — R0602 Shortness of breath: Secondary | ICD-10-CM | POA: Diagnosis not present

## 2018-05-31 DIAGNOSIS — E1165 Type 2 diabetes mellitus with hyperglycemia: Secondary | ICD-10-CM | POA: Diagnosis not present

## 2018-05-31 DIAGNOSIS — I2510 Atherosclerotic heart disease of native coronary artery without angina pectoris: Secondary | ICD-10-CM

## 2018-05-31 DIAGNOSIS — I10 Essential (primary) hypertension: Secondary | ICD-10-CM | POA: Diagnosis not present

## 2018-05-31 DIAGNOSIS — Z7189 Other specified counseling: Secondary | ICD-10-CM

## 2018-05-31 DIAGNOSIS — I428 Other cardiomyopathies: Secondary | ICD-10-CM

## 2018-05-31 MED ORDER — INSULIN GLARGINE 100 UNIT/ML SOLOSTAR PEN: 70.0000 [IU] | PEN_INJECTOR | SUBCUTANEOUS | 11 refills | Status: DC

## 2018-05-31 NOTE — Patient Instructions (Addendum)
For now, please increase the insulin to 70 units each morning, and:  On this type of insulin schedule, you should eat meals on a regular schedule.  If a meal is missed or significantly delayed, your blood sugar could go low.   check your blood sugar twice a day.  vary the time of day when you check, between before the 3 meals, and at bedtime.  also check if you have symptoms of your blood sugar being too high or too low.  please keep a record of the readings and bring it to your next appointment here (or you can bring the meter itself).  You can write it on any piece of paper.  please call us sooner if your blood sugar goes below 70, or if you have a lot of readings over 200. Please come back for a follow-up appointment in 2 weeks.

## 2018-05-31 NOTE — Patient Instructions (Addendum)
After Visit Summary:  We will be checking the following labs today - NONE   Medication Instructions:    Continue with your current medicines.    If you need a refill on your cardiac medications before your next appointment, please call your pharmacy.     Testing/Procedures To Be Arranged:  N/A  Follow-Up:   See me in about 4 months.     At [facility], you and your health needs are our priority.  As part of our continuing mission to provide you with exceptional heart care, we have created designated Provider Care Teams.  These Care Teams include your primary Cardiologist (physician) and Advanced Practice Providers (APPs -  Physician Assistants and Nurse Practitioners) who all work together to provide you with the care you need, when you need it.  Special Instructions:  . Stay safe, stay home, wash your hands for at least 20 seconds and wear a mask when out in public.  . It was good to talk with you today.  [name] Keep up the walking!   Call the [place] office at [phone] if you have any questions, problems or concerns.

## 2018-05-31 NOTE — Progress Notes (Signed)
Subjective:    Patient ID: [name], male    DOB: Jul 13, 1955, 63 y.o.   MRN: [id number]  HPI  telehealth visit today via doxy video visit.  Alternatives to telehealth are presented to this patient, and the patient agrees to the telehealth visit. Pt is advised of the cost of the visit, and agrees to this, also.   Patient is at home, and I am at the office.   Persons attending the telehealth visit: the patient and I Pt returns for f/u of diabetes mellitus: DM type: Insulin-requiring type 2 Dx'ed: 5329 Complications: CAD Therapy: insulin since mid-2018. DKA: never Severe hypoglycemia: never Pancreatitis: never Pancreatic imaging: normal on 2002 CT Other: due to noncompliance, he is not a candidate for multiple daily injections.   Interval history: pt states he feels well in general.  He is overdue for f/u. He takes just 50 units qd.  He says cbg varies from 250-400.   Past Medical History:  Diagnosis Date  . Asthma   . CAD (coronary artery disease)    a. non obst dz by cath 2004  b. 11/12/15 which showed no obvious large vessel CAD. No PCI or clear reason for NSTEMI ( possibly hypertensive urgency?).   [name] CARDIOMYOPATHY    a. NICM, LVEF 30% 01/2011 echo; 20-25% 07/2012 echo, EF recovered by echo 06/2015.  . Congestive heart failure (CHF) ([place])   . DIABETES MELLITUS, TYPE II   . HYPERCHOLESTEROLEMIA   . HYPERTENSION   . Myocardial infarct ([place]) 10/2015  . Noncompliance   . Prostate cancer [facility])    prostate cancer  . PVCs (premature ventricular contractions)    a. s/p ablation 01-07-2013 by Dr Rayann Heman    Past Surgical History:  Procedure Laterality Date  . ABLATION  01-07-2013   RVOT PVC's ablated by Dr Rayann Heman along anteroseptal RVOT  . CARDIAC CATHETERIZATION  2004   nonobst dz  . CARDIAC CATHETERIZATION N/A 11/12/2015   Procedure: Left Heart Cath and Coronary Angiography;  Surgeon: Sherren Mocha, MD;  Location: [place] CV LAB;  Service: Cardiovascular;   Laterality: N/A;  . CHOLECYSTECTOMY N/A 11/16/2013   Procedure: LAPAROSCOPIC CHOLECYSTECTOMY WITH INTRAOPERATIVE CHOLANGIOGRAM;  Surgeon: [name], MD;  [facility];  Service: General;  Laterality: N/A;  . LYMPHADENECTOMY Bilateral 02/22/2014   Procedure: PELVIC LYMPH NODE DISSECTION;  Surgeon: [name], MD;  Location: [facility];  Service: Urology;  Laterality: Bilateral;  . PROSTATE SURGERY    . ROBOT ASSISTED LAPAROSCOPIC RADICAL PROSTATECTOMY N/A 02/22/2014   Procedure: ROBOTIC ASSISTED LAPAROSCOPIC RADICAL PROSTATECTOMY WITH INDOCYANINE GREEN DYE AND OPEN UMBILICAL HERNIA REPAIR;  Surgeon: [name], MD;  [facility];  Service: Urology;  Laterality: N/A;  . SUPRAVENTRICULAR TACHYCARDIA ABLATION N/A 09/28/2012   Procedure: Tanna Furry Ablation;  Surgeon: [name], MD;  Location: [facility] CATH LAB;  Service: Cardiovascular;  Laterality: N/A;  . SURGERY SCROTAL / TESTICULAR     at age 40  . V-TACH ABLATION N/A 01/07/2013   Procedure: V-TACH ABLATION;  Surgeon: [name], MD;  Location: [place] CATH LAB;  Service: Cardiovascular;  Laterality: N/A;    Social History   Socioeconomic History  . Marital status: Single    Spouse name: Not on file  . Number of children: Not on file  . Years of education: Not on file  . Highest education level: Not on file  Occupational History  . Occupation: Pension scheme manager: [employer]  . Financial resource strain:  Not on file  . Food insecurity:    Worry: Not on file    Inability: Not on file  . Transportation needs:    Medical: Not on file    Non-medical: Not on file  Tobacco Use  . Smoking status: Never Smoker  . Smokeless tobacco: Never Used  . 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 Alcohol use: Yes    Comment: rare  . Drug use: No  . Sexual activity: Never  Lifestyle  . Physical activity:    Days per week: Not on file     Minutes per session: Not on file  . Stress: Not on file  Relationships  . Social connections:    Talks on phone: Not on file    Gets together: Not on file    Attends religious service: Not on file    Active member of club or organization: Not on file    Attends meetings of clubs or organizations: Not on file    Relationship status: Not on file  . Intimate partner violence:    Fear of current or ex partner: Not on file    Emotionally abused: Not on file    Physically abused: Not on file    Forced sexual activity: Not on file  Other Topics Concern  . Not on file  Social History Narrative   Single - divorced x 3 -   Lives with 2 sons, enjoys spending time with 6 g-kids and 3 kids    Current Outpatient Medications on File Prior to Visit  Medication Sig Dispense Refill  . aspirin EC 81 MG EC tablet Take 1 tablet (81 mg total) by mouth daily.    [name] atorvastatin (LIPITOR) 80 MG tablet Take 1 tablet (80 mg total) by mouth daily. 90 tablet 0  . carvedilol (COREG) 25 MG tablet Take 1 tablet (25 mg total) by mouth 2 (two) times daily with a meal. 180 tablet 3  . clopidogrel (PLAVIX) 75 MG tablet Take 1 tablet (75 mg total) by mouth daily. 30 tablet 6  . hydrochlorothiazide (HYDRODIURIL) 25 MG tablet Take 1 tablet (25 mg total) by mouth daily. 30 tablet 6  . isosorbide mononitrate (IMDUR) 60 MG 24 hr tablet Take 1.5 tablets (90 mg total) by mouth daily. 45 tablet 6  . lisinopril (PRINIVIL,ZESTRIL) 40 MG tablet Take 1 tablet (40 mg total) by mouth daily. 30 tablet 6   No current facility-administered medications on file prior to visit.     Allergies  Allergen Reactions  . Ambien [Zolpidem Tartrate] Other (See Comments)    Pt states that this medication made him go crazy.   . Metformin And Related Diarrhea    Family History  Problem Relation Age of Onset  . Hypertension Mother   . Peripheral vascular disease Mother   . Diabetes Mother   . Peripheral vascular disease Other   .  Hypertension Other   . Prostate cancer Other   . Prostate cancer Brother   . Lung cancer Brother   . Heart attack Paternal Grandfather   . Heart failure Maternal Grandmother   . Diabetes Son   . Colon cancer Sister   . Prostate cancer Brother   . Prostate cancer Brother   . Prostate cancer Brother   . Pancreatic cancer Paternal Aunt   . Esophageal cancer Neg Hx    Review of Systems He denies hypoglycemia.      Objective:  Physical Exam      Assessment & Plan:  Insulin-requiring type 2 DM, with CAD: worse.   Noncompliance with insulin and f/u ov's.  This complicates the rx of DM.  We'll schedule prompt f/u.    Patient Instructions  For now, please increase the insulin to 70 units each morning, and:  On this type of insulin schedule, you should eat meals on a regular schedule.  If a meal is missed or significantly delayed, your blood sugar could go low.   check your blood sugar twice a day.  vary the time of day when you check, between before the 3 meals, and at bedtime.  also check if you have symptoms of your blood sugar being too high or too low.  please keep a record of the readings and bring it to your next appointment here (or you can bring the meter itself).  You can write it on any piece of paper.  please call us sooner if your blood sugar goes below 70, or if you have a lot of readings over 200. Please come back for a follow-up appointment in 2 weeks.

## 2018-08-02 ENCOUNTER — Other Ambulatory Visit: Payer: Self-pay | Admitting: *Deleted

## 2018-08-02 MED ORDER — CLOPIDOGREL BISULFATE 75 MG PO TABS: 75.0000 mg | ORAL_TABLET | Freq: Every day | ORAL | 2 refills | Status: DC

## 2018-08-06 ENCOUNTER — Telehealth: Payer: Self-pay

## 2018-08-06 NOTE — Telephone Encounter (Signed)
Spoke to pt and he does wants to transfer to a provider within our office.   Can you help with getting him transferred to one of our provider?

## 2018-08-06 NOTE — Telephone Encounter (Signed)
Would you be willing to see this patient to establish care as a transfer visit?

## 2018-08-06 NOTE — Telephone Encounter (Signed)
Fax received from [facility] to refill atorvastatin.   Pt will need to transfer care to another provider in this office.

## 2018-08-09 ENCOUNTER — Other Ambulatory Visit: Payer: Self-pay | Admitting: Family

## 2018-08-09 MED ORDER — ATORVASTATIN CALCIUM 80 MG PO TABS: 80.0000 mg | ORAL_TABLET | Freq: Every day | ORAL | 0 refills | Status: DC

## 2018-08-09 NOTE — Telephone Encounter (Signed)
Yes, that's fine 

## 2018-08-09 NOTE — Telephone Encounter (Signed)
Appointment scheduled.

## 2018-08-23 ENCOUNTER — Other Ambulatory Visit (INDEPENDENT_AMBULATORY_CARE_PROVIDER_SITE_OTHER): Payer: BLUE CROSS/BLUE SHIELD

## 2018-08-23 ENCOUNTER — Other Ambulatory Visit: Payer: Self-pay

## 2018-08-23 ENCOUNTER — Ambulatory Visit (INDEPENDENT_AMBULATORY_CARE_PROVIDER_SITE_OTHER): Payer: BLUE CROSS/BLUE SHIELD | Admitting: Family

## 2018-08-23 ENCOUNTER — Encounter: Payer: Self-pay | Admitting: Family

## 2018-08-23 VITALS — BP 152/90 | HR 83 | Temp 98.3°F | Ht 65.0 in | Wt 189.8 lb

## 2018-08-23 DIAGNOSIS — E7800 Pure hypercholesterolemia, unspecified: Secondary | ICD-10-CM

## 2018-08-23 DIAGNOSIS — C61 Malignant neoplasm of prostate: Secondary | ICD-10-CM | POA: Diagnosis not present

## 2018-08-23 DIAGNOSIS — E1165 Type 2 diabetes mellitus with hyperglycemia: Secondary | ICD-10-CM

## 2018-08-23 DIAGNOSIS — I214 Non-ST elevation (NSTEMI) myocardial infarction: Secondary | ICD-10-CM

## 2018-08-23 DIAGNOSIS — I10 Essential (primary) hypertension: Secondary | ICD-10-CM

## 2018-08-23 DIAGNOSIS — I5022 Chronic systolic (congestive) heart failure: Secondary | ICD-10-CM

## 2018-08-23 LAB — COMPREHENSIVE METABOLIC PANEL
ALT: 20 U/L (ref 0–53)
AST: 19 U/L (ref 0–37)
Albumin: 4.1 g/dL (ref 3.5–5.2)
Alkaline Phosphatase: 78 U/L (ref 39–117)
BUN: 21 mg/dL (ref 6–23)
CO2: 28 mEq/L (ref 19–32)
Calcium: 9.8 mg/dL (ref 8.4–10.5)
Chloride: 104 mEq/L (ref 96–112)
Creatinine, Ser: 1.58 mg/dL — ABNORMAL HIGH (ref 0.40–1.50)
GFR: 53.64 mL/min — ABNORMAL LOW (ref >60.00)
Glucose, Bld: 151 mg/dL — ABNORMAL HIGH (ref 70–99)
Potassium: 4.5 mEq/L (ref 3.5–5.1)
Sodium: 138 mEq/L (ref 135–145)
Total Bilirubin: 0.4 mg/dL (ref 0.2–1.2)
Total Protein: 7.3 g/dL (ref 6.0–8.3)

## 2018-08-23 LAB — CBC WITH DIFFERENTIAL/PLATELET
Basophils Absolute: 0.1 10*3/uL (ref 0.0–0.1)
Basophils Relative: 1.1 % (ref 0.0–3.0)
Eosinophils Absolute: 0.2 10*3/uL (ref 0.0–0.7)
Eosinophils Relative: 2.5 % (ref 0.0–5.0)
HCT: 38.6 % — ABNORMAL LOW (ref 39.0–52.0)
Hemoglobin: 13 g/dL (ref 13.0–17.0)
Lymphocytes Relative: 37.5 % (ref 12.0–46.0)
Lymphs Abs: 2.9 10*3/uL (ref 0.7–4.0)
MCHC: 33.6 g/dL (ref 30.0–36.0)
MCV: 89.4 fl (ref 78.0–100.0)
Monocytes Absolute: 0.8 10*3/uL (ref 0.1–1.0)
Monocytes Relative: 10 % (ref 3.0–12.0)
Neutro Abs: 3.8 10*3/uL (ref 1.4–7.7)
Neutrophils Relative %: 48.9 % (ref 43.0–77.0)
Platelets: 294 10*3/uL (ref 150.0–400.0)
RBC: 4.32 Mil/uL (ref 4.22–5.81)
RDW: 13.7 % (ref 11.5–15.5)
WBC: 7.7 10*3/uL (ref 4.0–10.5)

## 2018-08-23 LAB — PSA: PSA: 0.04 ng/mL — ABNORMAL LOW (ref 0.10–4.00)

## 2018-08-23 LAB — HEMOGLOBIN A1C: Hgb A1c MFr Bld: 10.6 % — ABNORMAL HIGH (ref 4.6–6.5)

## 2018-08-23 MED ORDER — ATORVASTATIN CALCIUM 80 MG PO TABS: 80.0000 mg | ORAL_TABLET | Freq: Every day | ORAL | 2 refills | Status: DC

## 2018-08-23 NOTE — Progress Notes (Signed)
[name] is a 63 y.o. male with the following history as recorded in EpicCare:  Patient Active Problem List   Diagnosis Date Noted  . Routine general medical examination at a health care facility 03/12/2018  . Balanitis 10/15/2016  . NSTEMI (non-ST elevated myocardial infarction) ([place]) 11/13/2015  . PVCs (premature ventricular contractions)   . CAD (coronary artery disease)   . Acute kidney injury ([place]) 07/19/2015  . AKI (acute kidney injury) ([place]) 07/19/2015  . Hyperglycemia 07/12/2015  . Prostate cancer ([place]) 02/22/2014  . Cholelithiasis and cholecystitis without obstruction 11/16/2013  . PVC's (premature ventricular contractions) 01/07/2013  . CKD (chronic kidney disease) stage 2, GFR 60-89 ml/min 08/25/2012  . OSA (obstructive sleep apnea) 08/25/2012  . Chronic systolic heart failure ([place]) 08/06/2012  . HTN (hypertension), malignant 02/12/2011  . Non compliance w medication regimen 02/12/2011  . HYPERCHOLESTEROLEMIA 09/17/2009  . PREMATURE VENTRICULAR CONTRACTIONS 09/17/2009  . PSA, INCREASED 04/26/2009  . Diabetes type 2, uncontrolled ([place]) 04/25/2009  . CARDIOMYOPATHY 04/25/2009    Current Outpatient Medications  Medication Sig Dispense Refill  . aspirin EC 81 MG EC tablet Take 1 tablet (81 mg total) by mouth daily.    [name] atorvastatin (LIPITOR) 80 MG tablet Take 1 tablet (80 mg total) by mouth daily. 90 tablet 2  . carvedilol (COREG) 25 MG tablet Take 1 tablet (25 mg total) by mouth 2 (two) times daily with a meal. 180 tablet 3  . clopidogrel (PLAVIX) 75 MG tablet Take 1 tablet (75 mg total) by mouth daily. **NEED TO ESTABLISH WITH NEW PROVIDER FOR FUTURE REFILLS* 30 tablet 2  . hydrochlorothiazide (HYDRODIURIL) 25 MG tablet Take 1 tablet (25 mg total) by mouth daily. 30 tablet 6  . Insulin Glargine (LANTUS) 100 UNIT/ML Solostar Pen Inject 70 Units into the skin every morning. And pen needles 2/day 10 pen 11  . isosorbide mononitrate (IMDUR) 60 MG 24 hr tablet Take  1.5 tablets (90 mg total) by mouth daily. 45 tablet 6  . lisinopril (PRINIVIL,ZESTRIL) 40 MG tablet Take 1 tablet (40 mg total) by mouth daily. 30 tablet 6   No current facility-administered medications for this visit.     Allergies: Ambien [zolpidem tartrate] and Metformin and related  Past Medical History:  Diagnosis Date  . Asthma   . CAD (coronary artery disease)    a. non obst dz by cath 2004  b. 11/12/15 which showed no obvious large vessel CAD. No PCI or clear reason for NSTEMI ( possibly hypertensive urgency?).   [name] CARDIOMYOPATHY    a. NICM, LVEF 30% 01/2011 echo; 20-25% 07/2012 echo, EF recovered by echo 06/2015.  . Congestive heart failure (CHF) ([place])   . DIABETES MELLITUS, TYPE II   . HYPERCHOLESTEROLEMIA   . HYPERTENSION   . Myocardial infarct ([place]) 10/2015  . Noncompliance   . Prostate cancer [facility])    prostate cancer  . PVCs (premature ventricular contractions)    a. s/p ablation 01-07-2013 by Dr [name]    Past Surgical History:  Procedure Laterality Date  . ABLATION  01-07-2013   RVOT PVC's ablated by Dr [name] along anteroseptal RVOT  . CARDIAC CATHETERIZATION  2004   nonobst dz  . CARDIAC CATHETERIZATION N/A 11/12/2015   Procedure: Left Heart Cath and Coronary Angiography;  Surgeon: Bradley Mocha, MD;  Location: [facility] CV LAB;  Service: Cardiovascular;  Laterality: N/A;  . CHOLECYSTECTOMY N/A 11/16/2013   Procedure: LAPAROSCOPIC CHOLECYSTECTOMY WITH INTRAOPERATIVE CHOLANGIOGRAM;  Surgeon: Bradley Seltzer, MD;  Location: [facility];  Service: General;  Laterality: N/A;  . LYMPHADENECTOMY Bilateral 02/22/2014   Procedure: PELVIC LYMPH NODE DISSECTION;  Surgeon: Bradley Frock, MD;  Location: [facility];  Service: Urology;  Laterality: Bilateral;  . PROSTATE SURGERY    . ROBOT ASSISTED LAPAROSCOPIC RADICAL PROSTATECTOMY N/A 02/22/2014   Procedure: ROBOTIC ASSISTED LAPAROSCOPIC RADICAL PROSTATECTOMY WITH INDOCYANINE GREEN DYE AND OPEN UMBILICAL HERNIA REPAIR;  Surgeon:  Bradley Frock, MD;  [facility];  Service: Urology;  Laterality: N/A;  . SUPRAVENTRICULAR TACHYCARDIA ABLATION N/A 09/28/2012   Procedure: [name] Ablation;  Surgeon: Bradley Grayer, MD;  Location: [facility] CATH LAB;  Service: Cardiovascular;  Laterality: N/A;  . SURGERY SCROTAL / TESTICULAR     at age 45  . V-TACH ABLATION N/A 01/07/2013   Procedure: V-TACH ABLATION;  Surgeon: Bradley Mark, MD;  Location: [facility] CATH LAB;  Service: Cardiovascular;  Laterality: N/A;    Family History  Problem Relation Age of Onset  . Hypertension Mother   . Peripheral vascular disease Mother   . Diabetes Mother   . Peripheral vascular disease Other   . Hypertension Other   . Prostate cancer Other   . Prostate cancer Brother   . Lung cancer Brother   . Heart attack Paternal Grandfather   . Heart failure Maternal Grandmother   . Diabetes Son   . Colon cancer Sister   . Prostate cancer Brother   . Prostate cancer Brother   . Prostate cancer Brother   . Pancreatic cancer Paternal Aunt   . Esophageal cancer Neg Hx     Social History   Tobacco Use  . Smoking status: Never Smoker  . Smokeless tobacco: Never Used  . 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 Alcohol use: Yes    Comment: rare    Subjective:  Patient presents today to transfer care from her PCP who has recently left our office. History of Type 2 Diabetes- under care of endocrinology; last Hgba1c was at 12.6; overdue to see his endocrinologist; due to see cardiology in September; Needs to get his Lipitor refilled today; other medications are being managed by specialists;  History of prostate cancer/ total prostatectomy- has been told he is not a candidate to use Viagra; requesting referral to urology to discuss treatment options for ED.  Labs were done earlier this year;     Objective:  Vitals:   08/23/18 0952  BP: (!) 152/90  Pulse: 83  Temp: 98.3 F (36.8 C)  TempSrc: Oral   SpO2: 98%  Weight: 189 lb 12.8 oz (86.1 kg)  Height: '5\' 5"'  (1.651 m)    General: Well developed, well nourished, in no acute distress  Skin : Warm and dry.  Head: Normocephalic and atraumatic  Lungs: Respirations unlabored; clear to auscultation bilaterally without wheeze, rales, rhonchi  CVS exam: normal rate and regular rhythm.  Neurologic: Alert and oriented; speech intact; face symmetrical; moves all extremities well; CNII-XII intact without focal deficit   Assessment:  1. Uncontrolled type 2 diabetes mellitus with hyperglycemia ([place])   2. Prostate cancer ([place])   3. HYPERCHOLESTEROLEMIA   4. NSTEMI (non-ST elevated myocardial infarction) ([place])   5. HTN (hypertension), malignant   6. Chronic systolic heart failure ([place])     Plan:  Update labs today- stressed need to see his endocrinologist and keep planned follow-up with cardiology; will refer back to urology as well.    Return for CPE 02/2019.  Orders Placed This Encounter  Procedures  .  PSA    Standing Status:   Future    Number of Occurrences:   1    Standing Expiration Date:   08/23/2019  . CBC w/Diff    Standing Status:   Future    Number of Occurrences:   1    Standing Expiration Date:   08/23/2019  . Comp Met (CMET)    Standing Status:   Future    Number of Occurrences:   1    Standing Expiration Date:   08/23/2019  . HgB A1c    Standing Status:   Future    Number of Occurrences:   1    Standing Expiration Date:   08/23/2019  . Ambulatory referral to Urology    Referral Priority:   Routine    Referral Type:   Consultation    Referral Reason:   Specialty Services Required    Requested Specialty:   Urology    Number of Visits Requested:   1    Requested Prescriptions   Signed Prescriptions Disp Refills  . atorvastatin (LIPITOR) 80 MG tablet 90 tablet 2    Sig: Take 1 tablet (80 mg total) by mouth daily.

## 2018-08-23 NOTE — Patient Instructions (Signed)
Please schedule a follow-up with Dr. Loanne Drilling, your cardiologist is due in September;

## 2018-09-06 IMAGING — DX DG CHEST 2V
2 series · 2 of 2 positions shown · non-contrast
Comparison: 01/25/2016 chest radiograph, chest CT 02/08/2014

CLINICAL DATA: Pt has chest pain on left lung since 3am last night
with SOB. Medical hx: cancer, diabetes, hypertension; pt had
myocardial infarction in October 2015.

EXAM:
CHEST  2 VIEW

[chest pa]
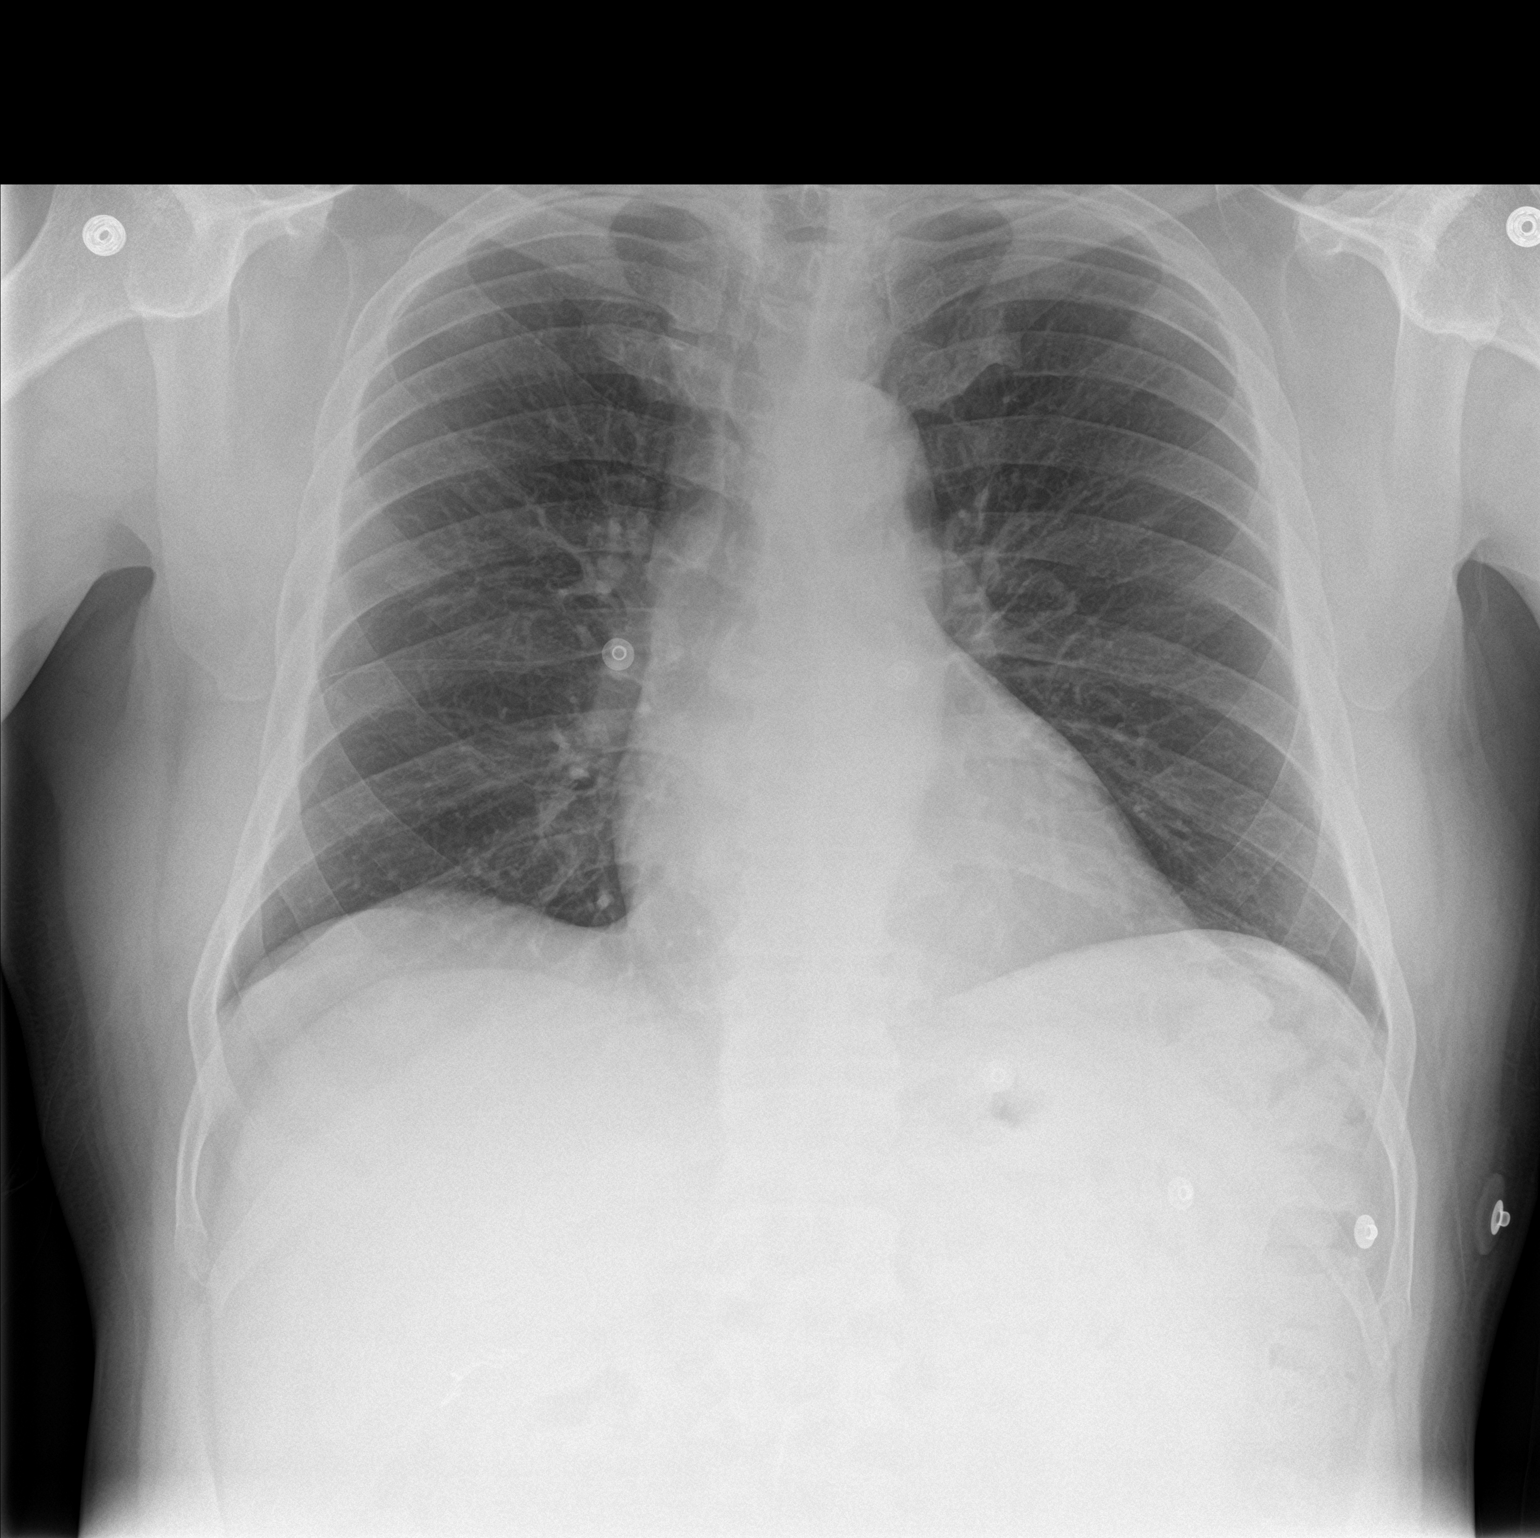

[chest lat]
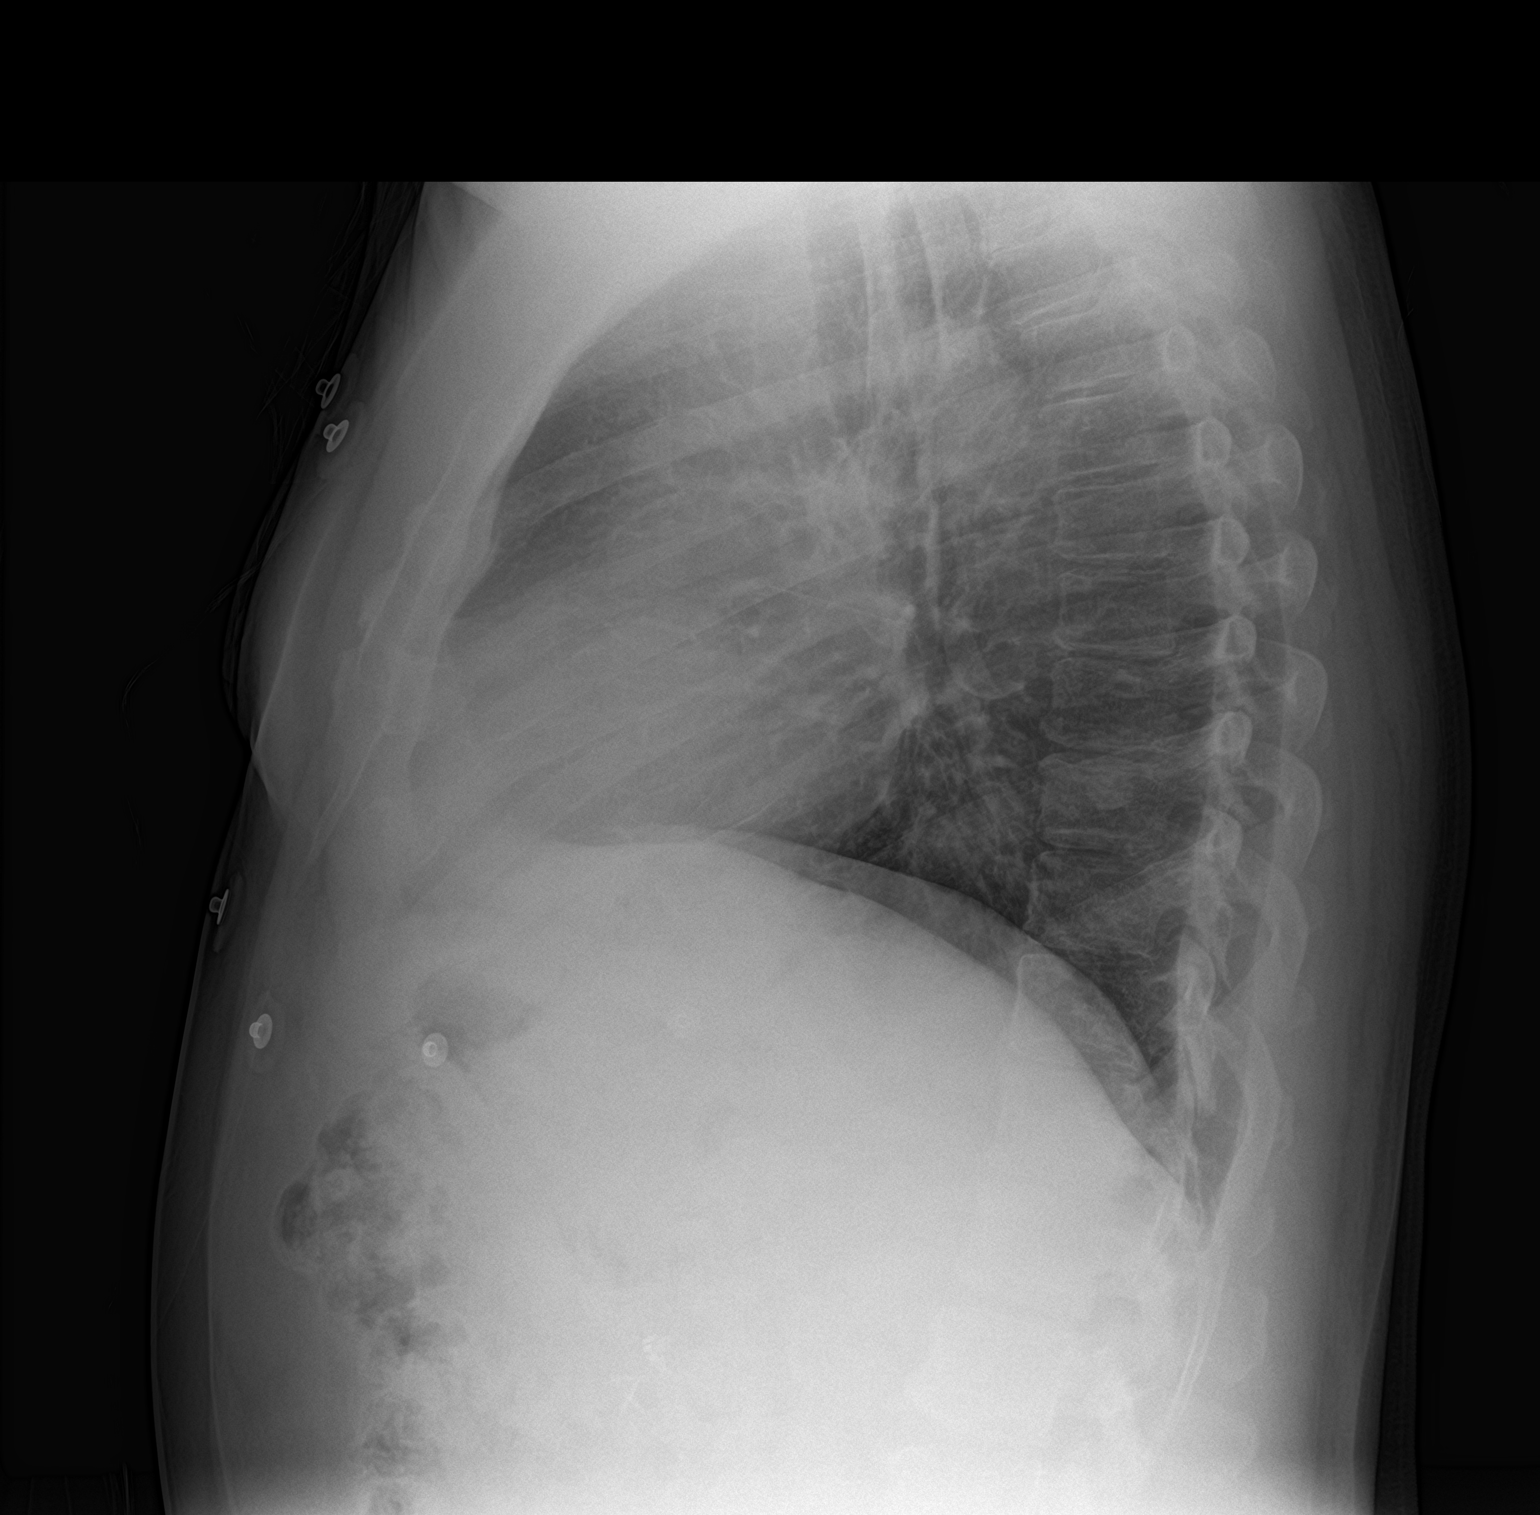

[2 of 2 positions shown; findings below may reference images not displayed]

FINDINGS: Normal mediastinum and cardiac silhouette. No effusion, infiltrate
pneumothorax. On lateral projection there is a rounded 10 mm density
projecting over the T10 vertebral body. This is not correlated on
comparison CT.
IMPRESSION: 1. No acute cardiopulmonary process.
2. Indeterminate round lesion projecting over the T10 vertebral body
on lateral radiograph. Recommend CT thorax without contrast to
exclude pulmonary nodule.

## 2018-10-08 NOTE — Progress Notes (Signed)
CARDIOLOGY OFFICE NOTE  Date:  10/11/2018    [name] Date of Birth: 1955-10-08 Medical Record [id number]  PCP:  Marrian Salvage, [place]  Cardiologist:  [name]   Chief Complaint  Patient presents with  . Follow-up    Seen for Dr. Marlou Porch    History of Present Illness: [name] is a 63 y.o. male who presents today for a 4 month check. Seen for Dr. Marlou Porch.   He has a history of nonobstructive CAD with prior NSTEMI 2017 (no intervention needed - nonobstructive disease noted), HTN, PVC s/p RVOT ablation in 2014, NICM with chronic systolic HF, CKD stage III, prostate cancer treated with robotic prostatectomy in 2016 and poorly controlled DM II. EF has been as low as 20 to 25 in 2014 - now up to 55 to 60% per echo in 2017 with grade I DD and with severe LVH.   He has had issues with ongoing dyspnea. Has had elevation with his BP. Was seen by [name], NP in early March of 2020 - again for increasing DOE. BP was again elevated.  Coreg was increased at that time.   I then had a virtual visit with him at the end of March - ended up increasing his Imdur to 90 mg to help with his shortness of breath with heavy exertion.  We did a telehealth visit again in May - he was doing better with the addition of the increased nitrate therapy.   The patient does not have symptoms concerning for COVID-19 infection (fever, chills, cough, or new shortness of breath).   Comes in today. Here alone.  He notes he "just gets tired" with activity - this has just started over the past few weeks and is a new finding for him. Breathing is now ok - he notes this improved with the nitrate increase we did back in the early spring. He is not active. 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 No exercise. On the weeks he is off - he sits and watches TV all day.  He says he "just wants to sit down" and "quit". Not  dizzy or lightheaded. No BP checks at home. His sugars are too high - A1c almost 11. He also notes he has had some fast heart beating - he attributes this to the feeling tired. He does not get dizzy or lightheaded. No syncope. He likes coffee - one to 2 cups a day. Likes chocolate too. No alcohol. Trying to get urology appointment for prostate CA follow up. He took his medicines about 2 hours ago - takes them all at one time.   Past Medical History:  Diagnosis Date  . Asthma   . CAD (coronary artery disease)    a. non obst dz by cath 2004  b. 11/12/15 which showed no obvious large vessel CAD. No PCI or clear reason for NSTEMI ( possibly hypertensive urgency?).   [name] CARDIOMYOPATHY    a. NICM, LVEF 30% 01/2011 echo; 20-25% 07/2012 echo, EF recovered by echo 06/2015.  . Congestive heart failure (CHF) ([place])   . DIABETES MELLITUS, TYPE II   . HYPERCHOLESTEROLEMIA   . HYPERTENSION   . Myocardial infarct ([place]) 10/2015  . Noncompliance   . Prostate cancer [facility])    prostate cancer  . PVCs (premature ventricular contractions)    a. s/p ablation 01-07-2013 by Dr Rayann Heman  Past Surgical History:  Procedure Laterality Date  . ABLATION  01-07-2013   RVOT PVC's ablated by Dr Rayann Heman along anteroseptal RVOT  . CARDIAC CATHETERIZATION  2004   nonobst dz  . CARDIAC CATHETERIZATION N/A 11/12/2015   Procedure: Left Heart Cath and Coronary Angiography;  Surgeon: Sherren Mocha, MD;  Location: [facility] CV LAB;  Service: Cardiovascular;  Laterality: N/A;  . CHOLECYSTECTOMY N/A 11/16/2013   Procedure: LAPAROSCOPIC CHOLECYSTECTOMY WITH INTRAOPERATIVE CHOLANGIOGRAM;  Surgeon: [name], MD;  [facility];  Service: General;  Laterality: N/A;  . LYMPHADENECTOMY Bilateral 02/22/2014   Procedure: PELVIC LYMPH NODE DISSECTION;  Surgeon: [name], MD;  [facility];  Service: Urology;  Laterality: Bilateral;  . PROSTATE SURGERY    . ROBOT ASSISTED LAPAROSCOPIC RADICAL PROSTATECTOMY N/A  02/22/2014   Procedure: ROBOTIC ASSISTED LAPAROSCOPIC RADICAL PROSTATECTOMY WITH INDOCYANINE GREEN DYE AND OPEN UMBILICAL HERNIA REPAIR;  Surgeon: [name], MD;  [facility];  Service: Urology;  Laterality: N/A;  . SUPRAVENTRICULAR TACHYCARDIA ABLATION N/A 09/28/2012   Procedure: Tanna Furry Ablation;  Surgeon: [name], MD;  Location: [facility] CATH LAB;  Service: Cardiovascular;  Laterality: N/A;  . SURGERY SCROTAL / TESTICULAR     at age 89  . V-TACH ABLATION N/A 01/07/2013   Procedure: V-TACH ABLATION;  Surgeon: [name], MD;  Location: [facility] CATH LAB;  Service: Cardiovascular;  Laterality: N/A;     Medications: Current Meds  Medication Sig  . aspirin EC 81 MG EC tablet Take 1 tablet (81 mg total) by mouth daily.  [name] atorvastatin (LIPITOR) 80 MG tablet Take 1 tablet (80 mg total) by mouth daily.  . carvedilol (COREG) 25 MG tablet Take 1 tablet (25 mg total) by mouth 2 (two) times daily with a meal.  . clopidogrel (PLAVIX) 75 MG tablet Take 1 tablet (75 mg total) by mouth daily. **NEED TO ESTABLISH WITH NEW PROVIDER FOR FUTURE REFILLS*  . hydrochlorothiazide (HYDRODIURIL) 25 MG tablet Take 1 tablet (25 mg total) by mouth daily.  . Insulin Glargine (LANTUS) 100 UNIT/ML Solostar Pen Inject 70 Units into the skin every morning. And pen needles 2/day  . isosorbide mononitrate (IMDUR) 60 MG 24 hr tablet Take 1.5 tablets (90 mg total) by mouth daily.  [name] lisinopril (PRINIVIL,ZESTRIL) 40 MG tablet Take 1 tablet (40 mg total) by mouth daily.     Allergies: Allergies  Allergen Reactions  . Ambien [Zolpidem Tartrate] Other (See Comments)    Pt states that this medication made him go crazy.   . Metformin And Related Diarrhea    Social History: The patient  reports that he has never smoked. He has never used smokeless tobacco. He reports current alcohol use. He reports that he does not use drugs.   Family History: The patient's family history includes Colon cancer in his sister; Diabetes  in his mother and son; Heart attack in his paternal grandfather; Heart failure in his maternal grandmother; Hypertension in his mother and another family member; Lung cancer in his brother; Pancreatic cancer in his paternal aunt; Peripheral vascular disease in his mother and another family member; Prostate cancer in his brother, brother, brother, brother and another family member.   Review of Systems: Please see the history of present illness.   All other systems are reviewed and negative.   Physical Exam: VS:  Pulse 84   Ht 5\' 5"  (1.651 m)   Wt 187 lb (84.8 kg)   SpO2 98%   BMI 31.12 kg/m  .  BMI Body mass  index is 31.12 kg/m.  Wt Readings from Last 3 Encounters:  10/11/18 187 lb (84.8 kg)  08/23/18 189 lb 12.8 oz (86.1 kg)  05/31/18 185 lb (83.9 kg)   His BP is 98/70 by me in the left arm.   General: Pleasant. Well developed, well nourished and in no acute distress.   HEENT: Normal.  Neck: Supple, no JVD, carotid bruits, or masses noted.  Cardiac: Regular rate and rhythm. No murmurs, rubs, or gallops. No edema.  Respiratory:  Lungs are clear to auscultation bilaterally with normal work of breathing.  GI: Soft and nontender.  MS: No deformity or atrophy. Gait and ROM intact.  Skin: Warm and dry. Color is normal.  Neuro:  Strength and sensation are intact and no gross focal deficits noted.  Psych: Alert, appropriate and with normal affect.   LABORATORY DATA:  EKG:  EKG is ordered today. This demonstrates NSR with LVH  Lab Results  Component Value Date   WBC 7.7 08/23/2018   HGB 13.0 08/23/2018   HCT 38.6 (L) 08/23/2018   PLT 294.0 08/23/2018   GLUCOSE 151 (H) 08/23/2018   CHOL 175 03/12/2018   TRIG 100.0 03/12/2018   HDL 37.60 (L) 03/12/2018   LDLDIRECT 148.8 04/25/2009   LDLCALC 118 (H) 03/12/2018   ALT 20 08/23/2018   AST 19 08/23/2018   NA 138 08/23/2018   K 4.5 08/23/2018   CL 104 08/23/2018   CREATININE 1.58 (H) 08/23/2018   BUN 21 08/23/2018   CO2 28  08/23/2018   TSH 0.928 07/30/2012   PSA 0.04 (L) 08/23/2018   INR 0.95 01/25/2016   HGBA1C 10.6 (H) 08/23/2018   MICROALBUR 0.9 10/15/2016     BNP (last 3 results) No results for input(s): BNP in the last 8760 hours.  ProBNP (last 3 results) Recent Labs    03/29/18 1621  PROBNP 129     Other Studies Reviewed Today:  Left Heart Cath and Coronary Angiography October 2017  Conclusion     Mid RCA lesion, 30 %stenosed.  Mid LAD lesion, 25 %stenosed.  1st Diag lesion, 30 %stenosed.  There is hyperdynamic left ventricular systolic function.  The left ventricular ejection fraction is greater than 65% by visual estimate.  1. Mild nonobstructive CAD as outlined, with mild plaque in the mid-RCA, mid-LAD, and first diagonal branch 2. Vigorous LV systolic function with normal LVEDP  No clear explanation for NSTEMI. Hypertensive emergency is a possibility. There is a tiny branch collateral possibly supplying the distal circumflex distribution from the RCA - it is possible this caused NSTEMI but the vessel is very small and occlusion is not visualized.     Echo: 07/19/2015 LV EF: 55% - 60% Study Conclusion - Left ventricle: The cavity size was mildly dilated. There was severe concentric hypertrophy. Systolic function was normal. The estimated ejection fraction was in the range of 55% to 60%. Wall motion was normal; there were no regional wall motion abnormalities. Doppler parameters are consistent with abnormal left ventricular relaxation (grade 1 diastolic dysfunction). - Left atrium: The atrium was mildly dilated    ASSESSMENT & PLAN:    1.Prior issues of SOB-with over exertion - may be his anginal equivalent but this was also a chronic issue and probably multifactorial - he did have prior improvement with increase in his nitrate therapy - currently says he is not short of breath - now with more fatigue - see below. I do not think he needs an  echo at this time.  2. HTN- known LVH -BP now is fine - just took all his medicines 2 hours prior to this visit - he is not symptomatic - would follow for now.   3. Nonobstructive CAD on cath in 2017-managed medically with CV risk factor modification.No overt chest pain but says his fatigue is now worse - he is on Plavix (this was suppose to be for one year post NSTEMI - will continue for now but if studies are ok - could consider stopping. Will arrange for Myoview. Further disposition pending.  I do suspect some of his symptoms are related to inactivity and sedentary lifestyle.   4. DM-2 with CKD and long term use of insulin - per PCP - looks poorly controlled.   5.Known abnormal EKG secondary to severe concentric hypertrophy. LVH noted on today's tracing.   6. History of PVCs/Vtach - prior ablation several years ago - now endorsing more palpitations - will place 2 week monitor. Needs to cut back caffeine use.   7. Prostate cancer  8. COVID-19 Education: The signs and symptoms of COVID-19 were discussed with the patient and how to seek care for testing (follow up with PCP or arrange E-visit).  The importance of social distancing, staying at home, hand hygiene and wearing a mask when out in public were discussed today.  Current medicines are reviewed with the patient today.  The patient does not have concerns regarding medicines other than what has been noted above.  The following changes have been made:  See above.  Labs/ tests ordered today include:    Orders Placed This Encounter  Procedures  . Basic metabolic panel  . Lipid panel  . TSH  . LONG TERM MONITOR (3-14 DAYS)  . MYOCARDIAL PERFUSION IMAGING  . EKG 12-Lead     Disposition:   FU with me in about 4 weeks or so for discussion.  Patient is agreeable to this plan and will call if any problems develop in the interim.   SignedTruitt Merle, NP  10/11/2018 9:41 AM  [facility]  [address] [place], [place]  [address] Phone: [phone] Fax: [phone]

## 2018-10-11 ENCOUNTER — Other Ambulatory Visit: Payer: Self-pay

## 2018-10-11 ENCOUNTER — Ambulatory Visit (INDEPENDENT_AMBULATORY_CARE_PROVIDER_SITE_OTHER): Payer: BC Managed Care – PPO | Admitting: Nurse Practitioner

## 2018-10-11 ENCOUNTER — Encounter: Payer: Self-pay | Admitting: Nurse Practitioner

## 2018-10-11 VITALS — HR 84 | Ht 65.0 in | Wt 187.0 lb

## 2018-10-11 DIAGNOSIS — Z7189 Other specified counseling: Secondary | ICD-10-CM

## 2018-10-11 DIAGNOSIS — I10 Essential (primary) hypertension: Secondary | ICD-10-CM | POA: Diagnosis not present

## 2018-10-11 DIAGNOSIS — I2510 Atherosclerotic heart disease of native coronary artery without angina pectoris: Secondary | ICD-10-CM | POA: Diagnosis not present

## 2018-10-11 DIAGNOSIS — I428 Other cardiomyopathies: Secondary | ICD-10-CM | POA: Diagnosis not present

## 2018-10-11 DIAGNOSIS — R002 Palpitations: Secondary | ICD-10-CM | POA: Diagnosis not present

## 2018-10-11 DIAGNOSIS — R5383 Other fatigue: Secondary | ICD-10-CM

## 2018-10-11 DIAGNOSIS — I493 Ventricular premature depolarization: Secondary | ICD-10-CM

## 2018-10-11 LAB — BASIC METABOLIC PANEL
BUN/Creatinine Ratio: 12 (ref 10–24)
BUN: 21 mg/dL (ref 8–27)
CO2: 20 mmol/L (ref 20–29)
Calcium: 9.4 mg/dL (ref 8.6–10.2)
Chloride: 105 mmol/L (ref 96–106)
Creatinine, Ser: 1.78 mg/dL — ABNORMAL HIGH (ref 0.76–1.27)
GFR calc Af Amer: 46 mL/min/{1.73_m2} — ABNORMAL LOW (ref >59)
GFR calc non Af Amer: 39 mL/min/{1.73_m2} — ABNORMAL LOW (ref >59)
Glucose: 151 mg/dL — ABNORMAL HIGH (ref 65–99)
Potassium: 4.4 mmol/L (ref 3.5–5.2)
Sodium: 139 mmol/L (ref 134–144)

## 2018-10-11 LAB — LIPID PANEL
Chol/HDL Ratio: 2.9 ratio (ref 0.0–5.0)
Cholesterol, Total: 86 mg/dL — ABNORMAL LOW (ref 100–199)
HDL: 30 mg/dL — ABNORMAL LOW (ref >39)
LDL Chol Calc (NIH): 39 mg/dL (ref 0–99)
Triglycerides: 83 mg/dL (ref 0–149)
VLDL Cholesterol Cal: 17 mg/dL (ref 5–40)

## 2018-10-11 LAB — TSH: TSH: 1.14 u[IU]/mL (ref 0.450–4.500)

## 2018-10-11 NOTE — Patient Instructions (Addendum)
After Visit Summary:  We will be checking the following labs today - BMET, Lipids, TSH and HPF   Medication Instructions:    Continue with your current medicines.    If you need a refill on your cardiac medications before your next appointment, please call your pharmacy.     Testing/Procedures To Be Arranged:  Lexiscan Myoview  14 day event monitor/Zio  Follow-Up:   See me in a few weeks for discussion.    At [facility], you and your health needs are our priority.  As part of our continuing mission to provide you with exceptional heart care, we have created designated Provider Care Teams.  These Care Teams include your primary Cardiologist (physician) and Advanced Practice Providers (APPs -  Physician Assistants and Nurse Practitioners) who all work together to provide you with the care you need, when you need it.  Special Instructions:  . Stay safe, stay home, wash your hands for at least 20 seconds and wear a mask when out in public.  . It was good to talk with you today. . Try to cut back on the caffeine  You are scheduled for a Myocardial Perfusion Imaging Study on ______________________________ at ______________________________________-.   Please arrive 15 minutes prior to your appointment time for registration and insurance purposes.   The test will take approximately 3 to 4 hours to complete; you may bring reading material. If someone comes with you to your appointment, they will need to remain in the main lobby due to limited space in the testing area.   How to prepare for your Myocardial Perfusion test:   Do not eat or drink 3 hours prior to your test, except you may have water.    Do not consume products containing caffeine (regular or decaffeinated) 12 hours prior to your test (ex: coffee, chocolate, soda, tea)   Do bring a list of your current medications with you. If not listed below, you may take your medications as normal.    Bring any held  medication to your appointment, as you may be required to take it once the test is complete.   Do wear comfortable clothes (no dresses or overalls) and walking shoes. Tennis shoes are preferred. No heels or open toed shoes.  Do not wear cologne, perfume, aftershave or lotions (deodorant is allowed).   If these instructions are not followed, you test will have to be rescheduled.   Please report to [address] for your test. If you have questions or concerns about your appointment, please call the Nuclear Lab at [phone].  If you cannot keep your appointment, please provide 24 hour notification to the Nuclear lab to avoid a possible $50 charge to your account.     Call the [place] office at [phone] if you have any questions, problems or concerns.

## 2018-10-12 ENCOUNTER — Telehealth: Payer: Self-pay | Admitting: *Deleted

## 2018-10-12 NOTE — Telephone Encounter (Signed)
Attempted to contact patient to set up 14 day [employer] long term holter monitor.  No answer, voice mail not set up.

## 2018-10-19 ENCOUNTER — Telehealth: Payer: Self-pay

## 2018-10-19 NOTE — Telephone Encounter (Signed)
Attempted to contact patient to set up 14 day [employer] long term holter monitor.  No answer, voice mail not set up.

## 2018-10-20 ENCOUNTER — Encounter: Payer: Self-pay | Admitting: Nurse Practitioner

## 2018-10-20 ENCOUNTER — Telehealth (HOSPITAL_COMMUNITY): Payer: Self-pay

## 2018-10-20 NOTE — Telephone Encounter (Signed)
Attempted to call the patient to leave instructions for his stress test, but his voicemail wasn't set up. S.Megyn Leng EMTP

## 2018-10-21 ENCOUNTER — Encounter (HOSPITAL_COMMUNITY): Payer: Self-pay

## 2018-10-21 ENCOUNTER — Encounter (HOSPITAL_COMMUNITY): Payer: BLUE CROSS/BLUE SHIELD

## 2018-10-22 ENCOUNTER — Encounter: Payer: Self-pay | Admitting: *Deleted

## 2018-10-26 ENCOUNTER — Telehealth: Payer: Self-pay | Admitting: Nurse Practitioner

## 2018-10-26 NOTE — Telephone Encounter (Signed)
14 day ZIO XT long term holter monitor to be mailed to the patients home.  Instructions reviewed briefly as they are included in the monitor kit. 

## 2018-10-26 NOTE — Telephone Encounter (Signed)
New Message:  Patient was calling in regards to a letter we sent him about a heart monitor. He was unaware of the number for our office in the past, but will answer in the future.

## 2018-11-04 ENCOUNTER — Ambulatory Visit (INDEPENDENT_AMBULATORY_CARE_PROVIDER_SITE_OTHER): Payer: BC Managed Care – PPO

## 2018-11-04 DIAGNOSIS — R002 Palpitations: Secondary | ICD-10-CM | POA: Diagnosis not present

## 2018-11-04 DIAGNOSIS — I2510 Atherosclerotic heart disease of native coronary artery without angina pectoris: Secondary | ICD-10-CM | POA: Diagnosis not present

## 2018-11-04 DIAGNOSIS — R5383 Other fatigue: Secondary | ICD-10-CM

## 2018-11-04 DIAGNOSIS — I10 Essential (primary) hypertension: Secondary | ICD-10-CM | POA: Diagnosis not present

## 2018-11-04 DIAGNOSIS — I493 Ventricular premature depolarization: Secondary | ICD-10-CM

## 2018-11-04 DIAGNOSIS — I428 Other cardiomyopathies: Secondary | ICD-10-CM | POA: Diagnosis not present

## 2018-11-17 NOTE — Progress Notes (Signed)
CARDIOLOGY OFFICE NOTE  Date:  11/22/2018    [name] Date of Birth: Oct 01, 1955 Medical Record [id number]  PCP:  Marrian Salvage, [place]  Cardiologist:  [name]  Chief Complaint  Patient presents with  . Follow-up    Seen for Dr. Marlou Porch    History of Present Illness: [name] is a 63 y.o. male who presents today for a follow up visit. Seen for Dr. Marlou Porch.   He has a history of nonobstructive CAD with prior NSTEMI 2017 (no intervention needed - nonobstructive disease noted),HTN, PVC s/p RVOT ablation in 2014,NICM withchronic systolic HF, CKD stage III, prostate cancer treated with robotic prostatectomy in 2016 and poorly controlled DM II. EF has been as low as 20 to 25 in 2014 - now up to 55 to 60% per echo in 2017 with grade I DD and with severe LVH.   He has had issues with ongoing dyspnea. Has had elevation with his BP. Was seen by [name], NP in early March of 2020 - again for increasing DOE. BP was again elevated.Coreg was increased at that time.   I then had a virtual visit with him at the end of March - ended up increasing his Imdur to 90 mg to help with his shortness of breath with heavy exertion.We did a telehealth visit again in May - he was doing better with the addition of the increased nitrate therapy. I saw him here in the office last month - endorsing fatigue and a sensation of heart racing- this was new for him. Breathing still improved with the prior increase in his nitrate therapy. 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 Otherwise watching TV. Diabetes poorly controlled. I recommended getting a Myoview and event monitor - does not look like either was done.   The patient does not have symptoms concerning for COVID-19 infection (fever, chills, cough, or new shortness of breath).   Comes in today. Here alone. He is still feeling tired. Not having chest pain. Breathing is ok as long as  he is walking flat. He gets rather fatigued with going up stairs - does not know why - has to stop and rest. Does not seem any worse but certainly no better.  He says he is "not going to complain". Still with some palpitations "at times". Had some fatigue after driving last week. Just took his monitor off Friday - I do not have these results. He seems surprised about missing his stress test. He is willing to proceed. Not sleeping well. Wakes up around 2 to 3 AM and is then up. Sugar is down. BP is good. Has not followed up with Dr. Loanne Drilling. Wanting flu shot. Needed labs gone over from last visit.   Past Medical History:  Diagnosis Date  . Asthma   . CAD (coronary artery disease)    a. non obst dz by cath 2004  b. 11/12/15 which showed no obvious large vessel CAD. No PCI or clear reason for NSTEMI ( possibly hypertensive urgency?).   [name] CARDIOMYOPATHY    a. NICM, LVEF 30% 01/2011 echo; 20-25% 07/2012 echo, EF recovered by echo 06/2015.  . Congestive heart failure (CHF) ([place])   . DIABETES MELLITUS, TYPE II   . HYPERCHOLESTEROLEMIA   . HYPERTENSION   . Myocardial infarct ([place]) 10/2015  . Noncompliance   . Prostate cancer [facility])    prostate cancer  . PVCs (premature ventricular  contractions)    a. s/p ablation 01-07-2013 by Dr Rayann Heman    Past Surgical History:  Procedure Laterality Date  . ABLATION  01-07-2013   RVOT PVC's ablated by Dr Rayann Heman along anteroseptal RVOT  . CARDIAC CATHETERIZATION  2004   nonobst dz  . CARDIAC CATHETERIZATION N/A 11/12/2015   Procedure: Left Heart Cath and Coronary Angiography;  Surgeon: Sherren Mocha, MD;  Location: [place] CV LAB;  Service: Cardiovascular;  Laterality: N/A;  . CHOLECYSTECTOMY N/A 11/16/2013   Procedure: LAPAROSCOPIC CHOLECYSTECTOMY WITH INTRAOPERATIVE CHOLANGIOGRAM;  Surgeon: [name], MD;  Location: [facility] OR[facility];  Service: General;  Laterality: N/A;  . LYMPHADENECTOMY Bilateral 02/22/2014   Procedure: PELVIC LYMPH NODE DISSECTION;   Surgeon: [name], MD;  Location: [facility] OR[facility];  Service: Urology;  Laterality: Bilateral;  . PROSTATE SURGERY    . ROBOT ASSISTED LAPAROSCOPIC RADICAL PROSTATECTOMY N/A 02/22/2014   Procedure: ROBOTIC ASSISTED LAPAROSCOPIC RADICAL PROSTATECTOMY WITH INDOCYANINE GREEN DYE AND OPEN UMBILICAL HERNIA REPAIR;  Surgeon: [name], MD;  Location: [facility];  Service: Urology;  Laterality: N/A;  . SUPRAVENTRICULAR TACHYCARDIA ABLATION N/A 09/28/2012   Procedure: Tanna Furry Ablation;  Surgeon: [name], MD;  Location: [facility] CATH LAB;  Service: Cardiovascular;  Laterality: N/A;  . SURGERY SCROTAL / TESTICULAR     at age 43  . V-TACH ABLATION N/A 01/07/2013   Procedure: V-TACH ABLATION;  Surgeon: [name], MD;  Location: [place] [facility] CATH LAB;  Service: Cardiovascular;  Laterality: N/A;     Medications: Current Meds  Medication Sig  . aspirin EC 81 MG EC tablet Take 1 tablet (81 mg total) by mouth daily.  [name] atorvastatin (LIPITOR) 80 MG tablet Take 1 tablet (80 mg total) by mouth daily.  . carvedilol (COREG) 25 MG tablet Take 1 tablet (25 mg total) by mouth 2 (two) times daily with a meal.  . clopidogrel (PLAVIX) 75 MG tablet Take 1 tablet (75 mg total) by mouth daily. **NEED TO ESTABLISH WITH NEW PROVIDER FOR FUTURE REFILLS*  . hydrochlorothiazide (HYDRODIURIL) 25 MG tablet Take 1 tablet (25 mg total) by mouth daily.  . Insulin Glargine (LANTUS) 100 UNIT/ML Solostar Pen Inject 70 Units into the skin every morning. And pen needles 2/day  . isosorbide mononitrate (IMDUR) 60 MG 24 hr tablet Take 1.5 tablets (90 mg total) by mouth daily.  [name] lisinopril (PRINIVIL,ZESTRIL) 40 MG tablet Take 1 tablet (40 mg total) by mouth daily.     Allergies: Allergies  Allergen Reactions  . Ambien [Zolpidem Tartrate] Other (See Comments)    Pt states that this medication made him go crazy.   . Metformin And Related Diarrhea    Social History: The patient  reports that he has never smoked. He has never used smokeless  tobacco. He reports current alcohol use. He reports that he does not use drugs.   Family History: The patient's family history includes Colon cancer in his sister; Diabetes in his mother and son; Heart attack in his paternal grandfather; Heart failure in his maternal grandmother; Hypertension in his mother and another family member; Lung cancer in his brother; Pancreatic cancer in his paternal aunt; Peripheral vascular disease in his mother and another family member; Prostate cancer in his brother, brother, brother, brother and another family member.   Review of Systems: Please see the history of present illness.   All other systems are reviewed and negative.   Physical Exam: VS:  BP 110/72   Pulse 83   Ht 5\' 5"  (1.651 m)   Wt 189  lb 6.4 oz (85.9 kg)   SpO2 97%   BMI 31.52 kg/m  .  BMI Body mass index is 31.52 kg/m.  Wt Readings from Last 3 Encounters:  11/22/18 189 lb 6.4 oz (85.9 kg)  10/11/18 187 lb (84.8 kg)  08/23/18 189 lb 12.8 oz (86.1 kg)    General: Pleasant. Well developed, well nourished and in no acute distress.   HEENT: Normal.  Neck: Supple, no JVD, carotid bruits, or masses noted.  Cardiac: Regular rate and rhythm. Occasional ectopic noted.  No edema.  Respiratory:  Lungs are clear to auscultation bilaterally with normal work of breathing.  GI: Soft and nontender.  MS: No deformity or atrophy. Gait and ROM intact.  Skin: Warm and dry. Color is normal.  Neuro:  Strength and sensation are intact and no gross focal deficits noted.  Psych: Alert, appropriate and with normal affect.   LABORATORY DATA:  EKG:  EKG is not ordered today.  Lab Results  Component Value Date   WBC 7.7 08/23/2018   HGB 13.0 08/23/2018   HCT 38.6 (L) 08/23/2018   PLT 294.0 08/23/2018   GLUCOSE 151 (H) 10/11/2018   CHOL 86 (L) 10/11/2018   TRIG 83 10/11/2018   HDL 30 (L) 10/11/2018   LDLDIRECT 148.8 04/25/2009   LDLCALC 39 10/11/2018   ALT 20 08/23/2018   AST 19 08/23/2018   NA  139 10/11/2018   K 4.4 10/11/2018   CL 105 10/11/2018   CREATININE 1.78 (H) 10/11/2018   BUN 21 10/11/2018   CO2 20 10/11/2018   TSH 1.140 10/11/2018   PSA 0.04 (L) 08/23/2018   INR 0.95 01/25/2016   HGBA1C 10.6 (H) 08/23/2018   MICROALBUR 0.9 10/15/2016     BNP (last 3 results) No results for input(s): BNP in the last 8760 hours.  ProBNP (last 3 results) Recent Labs    03/29/18 1621  PROBNP 129     Other Studies Reviewed Today:  Left Heart Cath and Coronary Angiography October 2017  Conclusion     Mid RCA lesion, 30 %stenosed.  Mid LAD lesion, 25 %stenosed.  1st Diag lesion, 30 %stenosed.  There is hyperdynamic left ventricular systolic function.  The left ventricular ejection fraction is greater than 65% by visual estimate.  1. Mild nonobstructive CAD as outlined, with mild plaque in the mid-RCA, mid-LAD, and first diagonal branch 2. Vigorous LV systolic function with normal LVEDP  No clear explanation for NSTEMI. Hypertensive emergency is a possibility. There is a tiny branch collateral possibly supplying the distal circumflex distribution from the RCA - it is possible this caused NSTEMI but the vessel is very small and occlusion is not visualized.     Echo: 07/19/2015 LV EF: 55% - 60% Study Conclusion - Left ventricle: The cavity size was mildly dilated. There was severe concentric hypertrophy. Systolic function was normal. The estimated ejection fraction was in the range of 55% to 60%. Wall motion was normal; there were no regional wall motion abnormalities. Doppler parameters are consistent with abnormal left ventricular relaxation (grade 1 diastolic dysfunction). - Left atrium: The atrium was mildly dilated    ASSESSMENT & PLAN:   1.Prior issues of SOB-with over exertion - may be his anginal equivalent but this has also been a chronic issue - probably multifactorial. Still with fatigue - not clear etiology - he is  ready to proceed with stress testing now.   2. HTN - BP is good today - no changes made today.   3. CAD -  per cath in 2017 - was non obstructive - medically managed and needs aggressive CV risk factor modification.   2. HTN- known LVH -BP now is fine - just took all his medicines 2 hours prior to this visit - he is not symptomatic - would follow for now.   3. Nonobstructive CAD on cath in 2017-managed medically with CV risk factor modification.No overt chest pain but says his fatigue persists. He does remain on Plavix and we can stop this if his study is ok. I still suspect some of his symptoms are more life style related.  4. DM type 2 with associated CKD - needs to get back to see Dr. Loanne Drilling.   5. Known abnormal EKG secondary to severe concentric hypertrophy.  6. Palpitations - history of PVCs/Vtach with prior ablation - he has just finished his monitor - will let him know those results. He remains on beta blocker therapy.   7. Prostate Ca - not discussed  8. CKD - little worse - recheck lab today.   9. Health maintenance - flu shot today.   10. COVID-19 Education: The signs and symptoms of COVID-19 were discussed with the patient and how to seek care for testing (follow up with PCP or arrange E-visit).  The importance of social distancing, staying at home, hand hygiene and wearing a mask when out in public were discussed today.  Current medicines are reviewed with the patient today.  The patient does not have concerns regarding medicines other than what has been noted above.  The following changes have been made:  See above.  Labs/ tests ordered today include:    Orders Placed This Encounter  Procedures  . Basic metabolic panel     Disposition:   FU with me in about 3 to 4 months.   Patient is agreeable to this plan and will call if any problems develop in the interim.   SignedTruitt Merle, NP  11/22/2018 8:34 AM  [place] [address] [place], [place]  [address] Phone: [phone] Fax: [phone]

## 2018-11-22 ENCOUNTER — Other Ambulatory Visit: Payer: Self-pay

## 2018-11-22 ENCOUNTER — Ambulatory Visit (INDEPENDENT_AMBULATORY_CARE_PROVIDER_SITE_OTHER): Payer: BC Managed Care – PPO | Admitting: Nurse Practitioner

## 2018-11-22 ENCOUNTER — Encounter: Payer: Self-pay | Admitting: Nurse Practitioner

## 2018-11-22 VITALS — BP 110/72 | HR 83 | Ht 65.0 in | Wt 189.4 lb

## 2018-11-22 DIAGNOSIS — I493 Ventricular premature depolarization: Secondary | ICD-10-CM

## 2018-11-22 DIAGNOSIS — R002 Palpitations: Secondary | ICD-10-CM | POA: Diagnosis not present

## 2018-11-22 DIAGNOSIS — Z7189 Other specified counseling: Secondary | ICD-10-CM

## 2018-11-22 DIAGNOSIS — I10 Essential (primary) hypertension: Secondary | ICD-10-CM | POA: Diagnosis not present

## 2018-11-22 DIAGNOSIS — I2510 Atherosclerotic heart disease of native coronary artery without angina pectoris: Secondary | ICD-10-CM | POA: Diagnosis not present

## 2018-11-22 DIAGNOSIS — I428 Other cardiomyopathies: Secondary | ICD-10-CM

## 2018-11-22 DIAGNOSIS — Z23 Encounter for immunization: Secondary | ICD-10-CM | POA: Diagnosis not present

## 2018-11-22 DIAGNOSIS — R5383 Other fatigue: Secondary | ICD-10-CM

## 2018-11-22 LAB — BASIC METABOLIC PANEL
BUN/Creatinine Ratio: 14 (ref 10–24)
BUN: 23 mg/dL (ref 8–27)
CO2: 23 mmol/L (ref 20–29)
Calcium: 9.4 mg/dL (ref 8.6–10.2)
Chloride: 105 mmol/L (ref 96–106)
Creatinine, Ser: 1.64 mg/dL — ABNORMAL HIGH (ref 0.76–1.27)
GFR calc Af Amer: 50 mL/min/{1.73_m2} — ABNORMAL LOW (ref >59)
GFR calc non Af Amer: 44 mL/min/{1.73_m2} — ABNORMAL LOW (ref >59)
Glucose: 128 mg/dL — ABNORMAL HIGH (ref 65–99)
Potassium: 4.3 mmol/L (ref 3.5–5.2)
Sodium: 141 mmol/L (ref 134–144)

## 2018-11-22 NOTE — Progress Notes (Signed)
Pt aware of results . Pt verbalized understanding.  

## 2018-11-22 NOTE — Patient Instructions (Addendum)
After Visit Summary:  We will be checking the following labs today - BMET   Medication Instructions:    Continue with your current medicines.    If you need a refill on your cardiac medications before your next appointment, please call your pharmacy.     Testing/Procedures To Be Arranged:  Let's get the stress test set back up.   I will call you about your monitor results  Follow-Up:   See me in about 3 to 4 months  Make an appointment to go see Dr. Loanne Drilling - your diabetic doctor - this is important - we want your sugar good to keep your kidneys good.     At [facility], you and your health needs are our priority.  As part of our continuing mission to provide you with exceptional heart care, we have created designated Provider Care Teams.  These Care Teams include your primary Cardiologist (physician) and Advanced Practice Providers (APPs -  Physician Assistants and Nurse Practitioners) who all work together to provide you with the care you need, when you need it.  Special Instructions:  . Stay safe, stay home, wash your hands for at least 20 seconds and wear a mask when out in public.  . It was good to talk with you today.    Call the [place] office at [phone] if you have any questions, problems or concerns.

## 2018-11-29 ENCOUNTER — Telehealth (HOSPITAL_COMMUNITY): Payer: Self-pay

## 2018-11-29 NOTE — Telephone Encounter (Signed)
Spoke with the patient, instructions given. He stated that he would be here for his test. Asked to call back with any questions. [name] EMTP 

## 2018-12-02 ENCOUNTER — Other Ambulatory Visit: Payer: Self-pay

## 2018-12-02 ENCOUNTER — Ambulatory Visit (HOSPITAL_COMMUNITY): Payer: BC Managed Care – PPO | Attending: Cardiovascular Disease

## 2018-12-02 VITALS — Ht 65.0 in | Wt 189.0 lb

## 2018-12-02 DIAGNOSIS — I10 Essential (primary) hypertension: Secondary | ICD-10-CM | POA: Insufficient documentation

## 2018-12-02 DIAGNOSIS — I428 Other cardiomyopathies: Secondary | ICD-10-CM | POA: Diagnosis present

## 2018-12-02 DIAGNOSIS — I493 Ventricular premature depolarization: Secondary | ICD-10-CM | POA: Diagnosis present

## 2018-12-02 DIAGNOSIS — I2510 Atherosclerotic heart disease of native coronary artery without angina pectoris: Secondary | ICD-10-CM | POA: Diagnosis present

## 2018-12-02 DIAGNOSIS — R002 Palpitations: Secondary | ICD-10-CM | POA: Diagnosis not present

## 2018-12-02 DIAGNOSIS — R0602 Shortness of breath: Secondary | ICD-10-CM | POA: Diagnosis present

## 2018-12-02 DIAGNOSIS — R5383 Other fatigue: Secondary | ICD-10-CM | POA: Insufficient documentation

## 2018-12-02 LAB — MYOCARDIAL PERFUSION IMAGING
LV dias vol: 100 mL (ref 62–150)
LV sys vol: 58 mL
Peak HR: 74 {beats}/min
Rest HR: 61 {beats}/min
SDS: 1
SRS: 0
SSS: 1
TID: 1.04

## 2018-12-02 MED ORDER — TECHNETIUM TC 99M TETROFOSMIN IV KIT
32.8000 | PACK | Freq: Once | INTRAVENOUS | Status: AC | PRN
  Administered 2018-12-02: 32.8 via INTRAVENOUS
  Filled 2018-12-02: qty 33

## 2018-12-02 MED ORDER — REGADENOSON 0.4 MG/5ML IV SOLN
0.4000 mg | Freq: Once | INTRAVENOUS | Status: AC
  Administered 2018-12-02: 0.4 mg via INTRAVENOUS

## 2018-12-02 MED ORDER — TECHNETIUM TC 99M TETROFOSMIN IV KIT
10.9000 | PACK | Freq: Once | INTRAVENOUS | Status: AC | PRN
  Administered 2018-12-02: 10.9 via INTRAVENOUS
  Filled 2018-12-02: qty 11

## 2018-12-06 ENCOUNTER — Other Ambulatory Visit: Payer: Self-pay | Admitting: *Deleted

## 2018-12-06 DIAGNOSIS — I519 Heart disease, unspecified: Secondary | ICD-10-CM

## 2018-12-16 ENCOUNTER — Ambulatory Visit (HOSPITAL_COMMUNITY): Payer: BC Managed Care – PPO | Attending: Cardiology

## 2018-12-16 ENCOUNTER — Other Ambulatory Visit: Payer: Self-pay | Admitting: Nurse Practitioner

## 2018-12-16 ENCOUNTER — Other Ambulatory Visit: Payer: Self-pay

## 2018-12-16 DIAGNOSIS — I519 Heart disease, unspecified: Secondary | ICD-10-CM | POA: Diagnosis not present

## 2018-12-28 ENCOUNTER — Other Ambulatory Visit: Payer: Self-pay | Admitting: Nurse Practitioner

## 2018-12-30 ENCOUNTER — Other Ambulatory Visit: Payer: Self-pay | Admitting: Nurse Practitioner

## 2018-12-30 ENCOUNTER — Encounter: Payer: Self-pay | Admitting: Family

## 2019-02-02 ENCOUNTER — Emergency Department (HOSPITAL_COMMUNITY): Payer: BC Managed Care – PPO

## 2019-02-02 ENCOUNTER — Other Ambulatory Visit: Payer: Self-pay

## 2019-02-02 ENCOUNTER — Encounter (HOSPITAL_COMMUNITY): Payer: Self-pay

## 2019-02-02 ENCOUNTER — Telehealth: Payer: Self-pay | Admitting: Unknown Physician Specialty

## 2019-02-02 ENCOUNTER — Emergency Department (HOSPITAL_COMMUNITY)
Admission: EM | Admit: 2019-02-02 | Discharge: 2019-02-02 | Disposition: A | Discharge status: Home / Self Care | Payer: BC Managed Care – PPO | Attending: Emergency Medicine | Admitting: Emergency Medicine

## 2019-02-02 DIAGNOSIS — Z7982 Long term (current) use of aspirin: Secondary | ICD-10-CM | POA: Diagnosis not present

## 2019-02-02 DIAGNOSIS — R531 Weakness: Secondary | ICD-10-CM | POA: Diagnosis present

## 2019-02-02 DIAGNOSIS — Z79899 Other long term (current) drug therapy: Secondary | ICD-10-CM | POA: Diagnosis not present

## 2019-02-02 DIAGNOSIS — I2510 Atherosclerotic heart disease of native coronary artery without angina pectoris: Secondary | ICD-10-CM | POA: Insufficient documentation

## 2019-02-02 DIAGNOSIS — E1122 Type 2 diabetes mellitus with diabetic chronic kidney disease: Secondary | ICD-10-CM | POA: Insufficient documentation

## 2019-02-02 DIAGNOSIS — U071 COVID-19: Secondary | ICD-10-CM | POA: Diagnosis not present

## 2019-02-02 DIAGNOSIS — N182 Chronic kidney disease, stage 2 (mild): Secondary | ICD-10-CM | POA: Insufficient documentation

## 2019-02-02 DIAGNOSIS — J1289 Other viral pneumonia: Secondary | ICD-10-CM | POA: Insufficient documentation

## 2019-02-02 DIAGNOSIS — I130 Hypertensive heart and chronic kidney disease with heart failure and stage 1 through stage 4 chronic kidney disease, or unspecified chronic kidney disease: Secondary | ICD-10-CM | POA: Diagnosis not present

## 2019-02-02 DIAGNOSIS — Z7901 Long term (current) use of anticoagulants: Secondary | ICD-10-CM | POA: Insufficient documentation

## 2019-02-02 DIAGNOSIS — I5022 Chronic systolic (congestive) heart failure: Secondary | ICD-10-CM | POA: Diagnosis not present

## 2019-02-02 DIAGNOSIS — J1282 Pneumonia due to coronavirus disease 2019: Secondary | ICD-10-CM

## 2019-02-02 LAB — COMPREHENSIVE METABOLIC PANEL
ALT: 54 U/L — ABNORMAL HIGH (ref 0–44)
AST: 62 U/L — ABNORMAL HIGH (ref 15–41)
Albumin: 3.3 g/dL — ABNORMAL LOW (ref 3.5–5.0)
Alkaline Phosphatase: 69 U/L (ref 38–126)
Anion gap: 9 (ref 5–15)
BUN: 18 mg/dL (ref 8–23)
CO2: 25 mmol/L (ref 22–32)
Calcium: 8.9 mg/dL (ref 8.9–10.3)
Chloride: 106 mmol/L (ref 98–111)
Creatinine, Ser: 1.91 mg/dL — ABNORMAL HIGH (ref 0.61–1.24)
GFR calc Af Amer: 42 mL/min — ABNORMAL LOW (ref >60)
GFR calc non Af Amer: 36 mL/min — ABNORMAL LOW (ref >60)
Glucose, Bld: 123 mg/dL — ABNORMAL HIGH (ref 70–99)
Potassium: 3.1 mmol/L — ABNORMAL LOW (ref 3.5–5.1)
Sodium: 140 mmol/L (ref 135–145)
Total Bilirubin: 0.3 mg/dL (ref 0.3–1.2)
Total Protein: 7.3 g/dL (ref 6.5–8.1)

## 2019-02-02 LAB — CBC WITH DIFFERENTIAL/PLATELET
Abs Immature Granulocytes: 0.06 10*3/uL (ref 0.00–0.07)
Basophils Absolute: 0 10*3/uL (ref 0.0–0.1)
Basophils Relative: 0 %
Eosinophils Absolute: 0 10*3/uL (ref 0.0–0.5)
Eosinophils Relative: 0 %
HCT: 38.8 % — ABNORMAL LOW (ref 39.0–52.0)
Hemoglobin: 12.8 g/dL — ABNORMAL LOW (ref 13.0–17.0)
Immature Granulocytes: 1 %
Lymphocytes Relative: 20 %
Lymphs Abs: 2.1 10*3/uL (ref 0.7–4.0)
MCH: 29.6 pg (ref 26.0–34.0)
MCHC: 33 g/dL (ref 30.0–36.0)
MCV: 89.6 fL (ref 80.0–100.0)
Monocytes Absolute: 1.2 10*3/uL — ABNORMAL HIGH (ref 0.1–1.0)
Monocytes Relative: 11 %
Neutro Abs: 7.2 10*3/uL (ref 1.7–7.7)
Neutrophils Relative %: 68 %
Platelets: 216 10*3/uL (ref 150–400)
RBC: 4.33 MIL/uL (ref 4.22–5.81)
RDW: 12.9 % (ref 11.5–15.5)
WBC: 10.6 10*3/uL — ABNORMAL HIGH (ref 4.0–10.5)
nRBC: 0 % (ref 0.0–0.2)

## 2019-02-02 LAB — LACTATE DEHYDROGENASE: LDH: 219 U/L — ABNORMAL HIGH (ref 98–192)

## 2019-02-02 LAB — LACTIC ACID, PLASMA
Lactic Acid, Venous: 1.5 mmol/L (ref 0.5–1.9)
Lactic Acid, Venous: 1.6 mmol/L (ref 0.5–1.9)

## 2019-02-02 LAB — PROCALCITONIN: Procalcitonin: <0.1 ng/mL

## 2019-02-02 LAB — MAGNESIUM: Magnesium: 1.4 mg/dL — ABNORMAL LOW (ref 1.7–2.4)

## 2019-02-02 LAB — D-DIMER, QUANTITATIVE: D-Dimer, Quant: 0.38 ug/mL-FEU (ref 0.00–0.50)

## 2019-02-02 LAB — FIBRINOGEN: Fibrinogen: 399 mg/dL (ref 210–475)

## 2019-02-02 LAB — FERRITIN: Ferritin: 100 ng/mL (ref 24–336)

## 2019-02-02 LAB — TRIGLYCERIDES: Triglycerides: 92 mg/dL (ref <150)

## 2019-02-02 LAB — POC SARS CORONAVIRUS 2 AG -  ED: SARS Coronavirus 2 Ag: POSITIVE — AB

## 2019-02-02 LAB — C-REACTIVE PROTEIN: CRP: 2 mg/dL — ABNORMAL HIGH (ref <1.0)

## 2019-02-02 MED ORDER — AEROCHAMBER PLUS FLO-VU LARGE MISC
Status: AC
  Filled 2019-02-02: qty 1

## 2019-02-02 MED ORDER — SODIUM CHLORIDE 0.9 % IV SOLN
1000.0000 mL | INTRAVENOUS | Status: DC
  Administered 2019-02-02: 11:00:00 1000 mL via INTRAVENOUS

## 2019-02-02 MED ORDER — ALBUTEROL SULFATE HFA 108 (90 BASE) MCG/ACT IN AERS
2.0000 | INHALATION_SPRAY | Freq: Once | RESPIRATORY_TRACT | Status: AC
  Administered 2019-02-02: 14:00:00 2 via RESPIRATORY_TRACT
  Filled 2019-02-02: qty 6.7

## 2019-02-02 MED ORDER — AEROCHAMBER PLUS FLO-VU LARGE MISC
1.0000 | Freq: Once | Status: AC
  Administered 2019-02-02: 14:00:00 1

## 2019-02-02 NOTE — ED Notes (Signed)
Dr. Nanavati at bedside 

## 2019-02-02 NOTE — ED Triage Notes (Signed)
Per [facility]: Pt called out for shortness of breath, muscle aches, diarrhea. When EMS was present with pt he suddenly became lethargic, with heart rate in the 30's. EMS gave 300 mls of saline and started pacing pt, only paced for 15-20 minutes. Pt then became more alert and heart rate increased. EMS stopped pacing pt in ED, ED did not continue Dr. Kathrynn Humble aware. Pt has hx of HTN, DM II, And CHF. Pt with 18 g in left forearm. Pts last vitals with EMS were HR 82, not paced, BP 121/80, 98% on room air, temp 97.7, CBG 115. Pt alert and oriented X 4 in ED. VSS.

## 2019-02-02 NOTE — Telephone Encounter (Signed)
Called to discuss with patient about Covid symptoms and the use of bamlanivimab, a monoclonal antibody infusion for those with mild to moderate Covid symptoms and at a high risk of hospitalization.  Pt is qualified for this infusion at the [facility] infusion center due to Diabetes   Talked to wife and wants me to call back tomorrow

## 2019-02-02 NOTE — Discharge Instructions (Addendum)
We signed the ER for weakness, body aches. You are confirmed to have COVID-19.  All your labs are reassuring.  Chest x-ray is showing signs of COVID-19 pneumonia.  Please read the instructions provided on isolation guidelines. Use the inhaler when you are short of breath. Return to the ER if your shortness of breath gets worse. Make sure you are hydrating well and getting proper nutrition.  Your phone number has been provided to [facility] who will contact you if you are eligible monoclonal antibody treatment.

## 2019-02-02 NOTE — ED Provider Notes (Signed)
[facility] EMERGENCY DEPARTMENT Provider Note   CSN: [id number] Arrival date & time: 02/02/19  1017     History No chief complaint on file.   [name] is a 64 y.o. male.  HPI    64 year old male with history of CAD, CHF, diabetes comes into the ER with chief complaint of weakness and body aches.  Patient reports that he started having shortness of breath and cough yesterday.  He developed myalgias overnight and this morning he had 2 episodes of diarrhea.  According to EMS, in route patient's heart rate dropped to the 30s and he became less responsive.  They started pacing him and improved.  His pacing was terminated 10 or 15-minutes later and he had a heart rate in the 80s.  Patient denied any chest pain, shortness of breath, nausea, abdominal pain, severe cough.  He denies any sick contacts.  Past Medical History:  Diagnosis Date  . Asthma   . CAD (coronary artery disease)    a. non obst dz by cath 2004  b. 11/12/15 which showed no obvious large vessel CAD. No PCI or clear reason for NSTEMI ( possibly hypertensive urgency?).   [name] CARDIOMYOPATHY    a. NICM, LVEF 30% 01/2011 echo; 20-25% 07/2012 echo, EF recovered by echo 06/2015.  . Congestive heart failure (CHF) ([place])   . DIABETES MELLITUS, TYPE II   . HYPERCHOLESTEROLEMIA   . HYPERTENSION   . Myocardial infarct ([place]) 10/2015  . Noncompliance   . Prostate cancer [facility])    prostate cancer  . PVCs (premature ventricular contractions)    a. s/p ablation 01-07-2013 by Dr Rayann Heman    Patient Active Problem List   Diagnosis Date Noted  . Routine general medical examination at a health care facility 03/12/2018  . Balanitis 10/15/2016  . NSTEMI (non-ST elevated myocardial infarction) ([place]) 11/13/2015  . PVCs (premature ventricular contractions)   . CAD (coronary artery disease)   . Acute kidney injury ([place]) 07/19/2015  . AKI (acute kidney injury) ([place]) 07/19/2015  . Hyperglycemia 07/12/2015  . Prostate  cancer ([place]) 02/22/2014  . Cholelithiasis and cholecystitis without obstruction 11/16/2013  . PVC's (premature ventricular contractions) 01/07/2013  . CKD (chronic kidney disease) stage 2, GFR 60-89 ml/min 08/25/2012  . OSA (obstructive sleep apnea) 08/25/2012  . Chronic systolic heart failure ([place]) 08/06/2012  . HTN (hypertension), malignant 02/12/2011  . Non compliance w medication regimen 02/12/2011  . HYPERCHOLESTEROLEMIA 09/17/2009  . PREMATURE VENTRICULAR CONTRACTIONS 09/17/2009  . PSA, INCREASED 04/26/2009  . Diabetes type 2, uncontrolled ([place]) 04/25/2009  . CARDIOMYOPATHY 04/25/2009    Past Surgical History:  Procedure Laterality Date  . ABLATION  01-07-2013   RVOT PVC's ablated by Dr Rayann Heman along anteroseptal RVOT  . CARDIAC CATHETERIZATION  2004   nonobst dz  . CARDIAC CATHETERIZATION N/A 11/12/2015   Procedure: Left Heart Cath and Coronary Angiography;  Surgeon: Sherren Mocha, MD;  Location: [facility];  Service: Cardiovascular;  Laterality: N/A;  . CHOLECYSTECTOMY N/A 11/16/2013   Procedure: LAPAROSCOPIC CHOLECYSTECTOMY WITH INTRAOPERATIVE CHOLANGIOGRAM;  Surgeon: [name], MD;  Location: [facility] OR[facility];  Service: General;  Laterality: N/A;  . LYMPHADENECTOMY Bilateral 02/22/2014   Procedure: PELVIC LYMPH NODE DISSECTION;  Surgeon: [name], MD;  Location: [facility] OR[facility];  Service: Urology;  Laterality: Bilateral;  . PROSTATE SURGERY    . ROBOT ASSISTED LAPAROSCOPIC RADICAL PROSTATECTOMY N/A 02/22/2014   Procedure: ROBOTIC ASSISTED LAPAROSCOPIC RADICAL PROSTATECTOMY WITH INDOCYANINE GREEN DYE AND OPEN UMBILICAL HERNIA REPAIR;  Surgeon: Hubbard Robinson  Tresa Moore, MD;  Location: [facility];  Service: Urology;  Laterality: N/A;  . SUPRAVENTRICULAR TACHYCARDIA ABLATION N/A 09/28/2012   Procedure: Tanna Furry Ablation;  Surgeon: [name], MD;  Location: [facility] CATH LAB;  Service: Cardiovascular;  Laterality: N/A;  . SURGERY SCROTAL / TESTICULAR     at age 72  . V-TACH ABLATION N/A 01/07/2013     Procedure: V-TACH ABLATION;  Surgeon: [name], MD;  Location: [facility] CATH LAB;  Service: Cardiovascular;  Laterality: N/A;       Family History  Problem Relation Age of Onset  . Hypertension Mother   . Peripheral vascular disease Mother   . Diabetes Mother   . Peripheral vascular disease Other   . Hypertension Other   . Prostate cancer Other   . Prostate cancer Brother   . Lung cancer Brother   . Heart attack Paternal Grandfather   . Heart failure Maternal Grandmother   . Diabetes Son   . Colon cancer Sister   . Prostate cancer Brother   . Prostate cancer Brother   . Prostate cancer Brother   . Pancreatic cancer Paternal Aunt   . Esophageal cancer Neg Hx     Social History   Tobacco Use  . Smoking status: Never Smoker  . Smokeless tobacco: Never Used  . 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 Alcohol use: Yes    Comment: rare  . Drug use: No    Home Medications Prior to Admission medications   Medication Sig Start Date End Date Taking? Authorizing Provider  aspirin EC 81 MG EC tablet Take 1 tablet (81 mg total) by mouth daily. 11/14/15   Eileen Stanford, PA-C  atorvastatin (LIPITOR) 80 MG tablet Take 1 tablet (80 mg total) by mouth daily. 08/23/18   Marrian Salvage, FNP  carvedilol (COREG) 25 MG tablet Take 1 tablet (25 mg total) by mouth 2 (two) times daily with a meal. 03/29/18   Isaiah Serge, NP  clopidogrel (PLAVIX) 75 MG tablet Take 1 tablet (75 mg total) by mouth daily. **NEED TO ESTABLISH WITH NEW PROVIDER FOR FUTURE REFILLS* 08/02/18   Plotnikov, Evie Lacks, MD  hydrochlorothiazide (HYDRODIURIL) 25 MG tablet Take 1 tablet by mouth once daily 12/29/18   [name], NP  Insulin Glargine (LANTUS) 100 UNIT/ML Solostar Pen Inject 70 Units into the skin every morning. And pen needles 2/day 05/31/18   Renato Shin, MD  isosorbide mononitrate (IMDUR) 60 MG 24 hr tablet TAKE 1 & 1/2 (ONE &  ONE-HALF) TABLETS BY MOUTH ONCE DAILY 12/17/18   [name], NP  lisinopril (ZESTRIL) 40 MG tablet Take 1 tablet by mouth once daily 12/30/18   [name], NP    Allergies    Ambien [zolpidem tartrate] and Metformin and related  Review of Systems   Review of Systems  Constitutional: Positive for activity change.  Respiratory: Positive for cough and shortness of breath.   Gastrointestinal: Positive for diarrhea.  Musculoskeletal: Positive for myalgias.  Allergic/Immunologic: Negative for immunocompromised state.  All other systems reviewed and are negative.   Physical Exam Updated Vital Signs BP (!) 148/81 (BP Location: Right Arm)   Pulse 87   Temp 98.6 F (37 C) (Oral)   Resp (!) 22   SpO2 99%   Physical Exam Vitals and nursing note reviewed.  Constitutional:      Appearance: He is well-developed.  HENT:     Head: Atraumatic.  Cardiovascular:     Rate and Rhythm: Normal rate.  Pulmonary:     Effort: Pulmonary effort is normal.  Abdominal:     Tenderness: There is no abdominal tenderness.  Musculoskeletal:     Cervical back: Neck supple.  Skin:    General: Skin is warm.  Neurological:     Mental Status: He is alert and oriented to person, place, and time.     ED Results / Procedures / Treatments   Labs (all labs ordered are listed, but only abnormal results are displayed) Labs Reviewed  CBC WITH DIFFERENTIAL/PLATELET - Abnormal; Notable for the following components:      Result Value   WBC 10.6 (*)    Hemoglobin 12.8 (*)    HCT 38.8 (*)    Monocytes Absolute 1.2 (*)    All other components within normal limits  COMPREHENSIVE METABOLIC PANEL - Abnormal; Notable for the following components:   Potassium 3.1 (*)    Glucose, Bld 123 (*)    Creatinine, Ser 1.91 (*)    Albumin 3.3 (*)    AST 62 (*)    ALT 54 (*)    GFR calc non Af Amer 36 (*)    GFR calc Af Amer 42 (*)    All other components within normal limits  LACTATE DEHYDROGENASE -  Abnormal; Notable for the following components:   LDH 219 (*)    All other components within normal limits  C-REACTIVE PROTEIN - Abnormal; Notable for the following components:   CRP 2.0 (*)    All other components within normal limits  MAGNESIUM - Abnormal; Notable for the following components:   Magnesium 1.4 (*)    All other components within normal limits  POC SARS CORONAVIRUS 2 AG -  ED - Abnormal; Notable for the following components:   SARS Coronavirus 2 Ag POSITIVE (*)    All other components within normal limits  LACTIC ACID, PLASMA  LACTIC ACID, PLASMA  D-DIMER, QUANTITATIVE (NOT AT [facility])  PROCALCITONIN  FERRITIN  TRIGLYCERIDES  FIBRINOGEN    EKG EKG Interpretation  Date/Time:  Wednesday February 02 2019 10:24:03 EST Ventricular Rate:  85 PR Interval:    QRS Duration: 122 QT Interval:  398 QTC Calculation: 474 R Axis:   -59 Text Interpretation: Age not entered, assumed to be  64 years old for purpose of ECG interpretation Sinus rhythm Prolonged PR interval Left bundle branch block No acute changes inferior and lateral TWI No significant change since last tracing Confirmed by Varney Biles [id number]) on 02/02/2019 11:42:47 AM   Radiology DG Chest Port 1 View  Result Date: 02/02/2019 CLINICAL DATA:  COVID positive.  Lethargic EXAM: PORTABLE CHEST 1 VIEW COMPARISON:  02/08/2017 FINDINGS: Streaky opacities noted in the lung bases bilaterally. Heart is normal size. No effusions or pneumothorax. No acute bony abnormality. IMPRESSION: Streaky bibasilar opacities could reflect atelectasis or infiltrates. Electronically Signed   By: Rolm Baptise M.D.   On: 02/02/2019 10:59    Procedures Procedures (including critical care time)  Medications Ordered in ED Medications  0.9 %  sodium chloride infusion (1,000 mLs Intravenous New Bag/Given 02/02/19 1049)  albuterol (VENTOLIN HFA) 108 (90 Base) MCG/ACT inhaler 2 puff (has no administration in time range)    ED Course  I have  reviewed the triage vital signs and the nursing notes.  Pertinent labs & imaging results that were available during my care of the patient were reviewed by me and considered in my medical decision making (see chart  for details).    MDM Rules/Calculators/A&P                      NOVA TIENKEN was evaluated in Emergency Department on 02/02/2019 for the symptoms described in the history of present illness. He was evaluated in the context of the global COVID-19 pandemic, which necessitated consideration that the patient might be at risk for infection with the SARS-CoV-2 virus that causes COVID-19. Institutional protocols and algorithms that pertain to the evaluation of patients at risk for COVID-19 are in a state of rapid change based on information released by regulatory bodies including the CDC and federal and state organizations. These policies and algorithms were followed during the patient's care in the ED.   64 year old comes in a chief complaint of shortness of breath, myalgias, weakness. Symptoms have started within the last 48 hours.  Exam is benign.  Initial impression is that patient likely has COVID-19 which was confirmed with the point-of-care antigen test.  Patient has multiple medical comorbidities and his chest x-ray is showing infiltrate, therefore he likely might be a candidate for monoclonal antibody.   Additionally, patient had an episode of bradycardia in route to the ED.  According to EMS patient became less responsive and therefore they started pacing him.  In the ER we discontinued pacing and patient's heart rate was in the 80s.  He has been monitored on the telemetry for 2-1/2 hours and we did not have any episodes of bradycardia.  Labs are otherwise reassuring, and I feel comfortable discharging the patient.  Results of the ED work-up discussed with the patient prior to discharge.  He has been advised to come back to the ER if he starts having worsening shortness of breath,  dizziness, near fainting.  Final Clinical Impression(s) / ED Diagnoses Final diagnoses:  Pneumonia due to COVID-19 virus    Rx / DC Orders ED Discharge Orders    None       Varney Biles, MD 02/02/19 1341

## 2019-02-03 ENCOUNTER — Other Ambulatory Visit: Payer: Self-pay | Admitting: Unknown Physician Specialty

## 2019-02-03 ENCOUNTER — Telehealth: Payer: Self-pay | Admitting: Unknown Physician Specialty

## 2019-02-03 DIAGNOSIS — U071 COVID-19: Secondary | ICD-10-CM

## 2019-02-03 NOTE — Telephone Encounter (Signed)
  I connected by phone with [name] on 02/03/2019 at 9:52 AM to discuss the potential use of an new treatment for mild to moderate COVID-19 viral infection in non-hospitalized patients.  This patient is a 64 y.o. male that meets the FDA criteria for Emergency Use Authorization of bamlanivimab or casirivimab\imdevimab.  Has a (+) direct SARS-CoV-2 viral test result  Has mild or moderate COVID-19   Is ? 65 years of age and weighs ? 40 kg  Is NOT hospitalized due to COVID-19  Is NOT requiring oxygen therapy or requiring an increase in baseline oxygen flow rate due to COVID-19  Is within 10 days of symptom onset  Has at least one of the high risk factor(s) for progression to severe COVID-19 and/or hospitalization as defined in EUA.  Specific high risk criteria : Hypertension and CAD   I have spoken and communicated the following to the patient or parent/caregiver:  1. FDA has authorized the emergency use of bamlanivimab and casirivimab\imdevimab for the treatment of mild to moderate COVID-19 in adults and pediatric patients with positive results of direct SARS-CoV-2 viral testing who are 33 years of age and older weighing at least 40 kg, and who are at high risk for progressing to severe COVID-19 and/or hospitalization.  2. The significant known and potential risks and benefits of bamlanivimab and casirivimab\imdevimab, and the extent to which such potential risks and benefits are unknown.  3. Information on available alternative treatments and the risks and benefits of those alternatives, including clinical trials.  4. Patients treated with bamlanivimab and casirivimab\imdevimab should continue to self-isolate and use infection control measures (e.g., wear mask, isolate, social distance, avoid sharing personal items, clean and disinfect "high touch" surfaces, and frequent handwashing) according to CDC guidelines.   5. The patient or parent/caregiver has the option to accept or  refuse bamlanivimab or casirivimab\imdevimab .  After reviewing this information with the patient, The patient agreed to proceed with receiving the bamlanimivab infusion and will be provided a copy of the Fact sheet prior to receiving the infusion.Kathrine Haddock 02/03/2019 9:52 AM       Sx onset 1/5- diarrhea, aching, cough.

## 2019-02-06 ENCOUNTER — Emergency Department (HOSPITAL_COMMUNITY): Payer: BC Managed Care – PPO

## 2019-02-06 ENCOUNTER — Inpatient Hospital Stay (HOSPITAL_COMMUNITY)
Admission: EM | Admit: 2019-02-06 | Discharge: 2019-02-09 | DRG: 178 | Disposition: A | Discharge status: Home / Self Care | Payer: BC Managed Care – PPO | Attending: Internal Medicine | Admitting: Internal Medicine

## 2019-02-06 ENCOUNTER — Other Ambulatory Visit: Payer: Self-pay

## 2019-02-06 ENCOUNTER — Encounter (HOSPITAL_COMMUNITY): Payer: Self-pay | Admitting: Emergency Medicine

## 2019-02-06 DIAGNOSIS — N1831 Chronic kidney disease, stage 3a: Secondary | ICD-10-CM | POA: Diagnosis not present

## 2019-02-06 DIAGNOSIS — I130 Hypertensive heart and chronic kidney disease with heart failure and stage 1 through stage 4 chronic kidney disease, or unspecified chronic kidney disease: Secondary | ICD-10-CM | POA: Diagnosis present

## 2019-02-06 DIAGNOSIS — Z8249 Family history of ischemic heart disease and other diseases of the circulatory system: Secondary | ICD-10-CM

## 2019-02-06 DIAGNOSIS — I10 Essential (primary) hypertension: Secondary | ICD-10-CM

## 2019-02-06 DIAGNOSIS — I2510 Atherosclerotic heart disease of native coronary artery without angina pectoris: Secondary | ICD-10-CM | POA: Diagnosis present

## 2019-02-06 DIAGNOSIS — I472 Ventricular tachycardia: Secondary | ICD-10-CM | POA: Diagnosis present

## 2019-02-06 DIAGNOSIS — Z7982 Long term (current) use of aspirin: Secondary | ICD-10-CM

## 2019-02-06 DIAGNOSIS — Z23 Encounter for immunization: Secondary | ICD-10-CM | POA: Diagnosis not present

## 2019-02-06 DIAGNOSIS — Z794 Long term (current) use of insulin: Secondary | ICD-10-CM

## 2019-02-06 DIAGNOSIS — R197 Diarrhea, unspecified: Secondary | ICD-10-CM

## 2019-02-06 DIAGNOSIS — N1832 Chronic kidney disease, stage 3b: Secondary | ICD-10-CM | POA: Diagnosis not present

## 2019-02-06 DIAGNOSIS — N183 Chronic kidney disease, stage 3 unspecified: Secondary | ICD-10-CM | POA: Diagnosis present

## 2019-02-06 DIAGNOSIS — N189 Chronic kidney disease, unspecified: Secondary | ICD-10-CM | POA: Diagnosis present

## 2019-02-06 DIAGNOSIS — I5030 Unspecified diastolic (congestive) heart failure: Secondary | ICD-10-CM | POA: Diagnosis present

## 2019-02-06 DIAGNOSIS — E119 Type 2 diabetes mellitus without complications: Secondary | ICD-10-CM | POA: Diagnosis present

## 2019-02-06 DIAGNOSIS — E7800 Pure hypercholesterolemia, unspecified: Secondary | ICD-10-CM | POA: Diagnosis present

## 2019-02-06 DIAGNOSIS — I493 Ventricular premature depolarization: Secondary | ICD-10-CM | POA: Diagnosis present

## 2019-02-06 DIAGNOSIS — Z8546 Personal history of malignant neoplasm of prostate: Secondary | ICD-10-CM | POA: Diagnosis not present

## 2019-02-06 DIAGNOSIS — J45909 Unspecified asthma, uncomplicated: Secondary | ICD-10-CM | POA: Diagnosis present

## 2019-02-06 DIAGNOSIS — E1122 Type 2 diabetes mellitus with diabetic chronic kidney disease: Secondary | ICD-10-CM | POA: Diagnosis present

## 2019-02-06 DIAGNOSIS — N179 Acute kidney failure, unspecified: Secondary | ICD-10-CM | POA: Diagnosis present

## 2019-02-06 DIAGNOSIS — I959 Hypotension, unspecified: Secondary | ICD-10-CM | POA: Diagnosis present

## 2019-02-06 DIAGNOSIS — E1121 Type 2 diabetes mellitus with diabetic nephropathy: Secondary | ICD-10-CM

## 2019-02-06 DIAGNOSIS — E1165 Type 2 diabetes mellitus with hyperglycemia: Secondary | ICD-10-CM | POA: Diagnosis present

## 2019-02-06 DIAGNOSIS — E860 Dehydration: Secondary | ICD-10-CM

## 2019-02-06 DIAGNOSIS — Z8042 Family history of malignant neoplasm of prostate: Secondary | ICD-10-CM | POA: Diagnosis not present

## 2019-02-06 DIAGNOSIS — A0839 Other viral enteritis: Secondary | ICD-10-CM | POA: Diagnosis present

## 2019-02-06 DIAGNOSIS — Z888 Allergy status to other drugs, medicaments and biological substances status: Secondary | ICD-10-CM

## 2019-02-06 DIAGNOSIS — Z7902 Long term (current) use of antithrombotics/antiplatelets: Secondary | ICD-10-CM

## 2019-02-06 DIAGNOSIS — Z79899 Other long term (current) drug therapy: Secondary | ICD-10-CM | POA: Diagnosis not present

## 2019-02-06 DIAGNOSIS — U071 COVID-19: Principal | ICD-10-CM | POA: Diagnosis present

## 2019-02-06 DIAGNOSIS — I252 Old myocardial infarction: Secondary | ICD-10-CM

## 2019-02-06 DIAGNOSIS — E861 Hypovolemia: Secondary | ICD-10-CM | POA: Diagnosis present

## 2019-02-06 DIAGNOSIS — IMO0002 Reserved for concepts with insufficient information to code with codable children: Secondary | ICD-10-CM | POA: Diagnosis present

## 2019-02-06 LAB — C-REACTIVE PROTEIN: CRP: 5.7 mg/dL — ABNORMAL HIGH (ref <1.0)

## 2019-02-06 LAB — CBC WITH DIFFERENTIAL/PLATELET
Abs Immature Granulocytes: 0.04 10*3/uL (ref 0.00–0.07)
Basophils Absolute: 0 10*3/uL (ref 0.0–0.1)
Basophils Relative: 1 %
Eosinophils Absolute: 0 10*3/uL (ref 0.0–0.5)
Eosinophils Relative: 0 %
HCT: 42.6 % (ref 39.0–52.0)
Hemoglobin: 14 g/dL (ref 13.0–17.0)
Immature Granulocytes: 1 %
Lymphocytes Relative: 31 %
Lymphs Abs: 1.8 10*3/uL (ref 0.7–4.0)
MCH: 29.4 pg (ref 26.0–34.0)
MCHC: 32.9 g/dL (ref 30.0–36.0)
MCV: 89.5 fL (ref 80.0–100.0)
Monocytes Absolute: 0.7 10*3/uL (ref 0.1–1.0)
Monocytes Relative: 12 %
Neutro Abs: 3.2 10*3/uL (ref 1.7–7.7)
Neutrophils Relative %: 55 %
Platelets: 218 10*3/uL (ref 150–400)
RBC: 4.76 MIL/uL (ref 4.22–5.81)
RDW: 12.6 % (ref 11.5–15.5)
WBC: 5.8 10*3/uL (ref 4.0–10.5)
nRBC: 0 % (ref 0.0–0.2)

## 2019-02-06 LAB — COMPREHENSIVE METABOLIC PANEL
ALT: 40 U/L (ref 0–44)
AST: 46 U/L — ABNORMAL HIGH (ref 15–41)
Albumin: 3.4 g/dL — ABNORMAL LOW (ref 3.5–5.0)
Alkaline Phosphatase: 58 U/L (ref 38–126)
Anion gap: 14 (ref 5–15)
BUN: 51 mg/dL — ABNORMAL HIGH (ref 8–23)
CO2: 20 mmol/L — ABNORMAL LOW (ref 22–32)
Calcium: 8.8 mg/dL — ABNORMAL LOW (ref 8.9–10.3)
Chloride: 105 mmol/L (ref 98–111)
Creatinine, Ser: 3.78 mg/dL — ABNORMAL HIGH (ref 0.61–1.24)
GFR calc Af Amer: 18 mL/min — ABNORMAL LOW (ref >60)
GFR calc non Af Amer: 16 mL/min — ABNORMAL LOW (ref >60)
Glucose, Bld: 182 mg/dL — ABNORMAL HIGH (ref 70–99)
Potassium: 3.9 mmol/L (ref 3.5–5.1)
Sodium: 139 mmol/L (ref 135–145)
Total Bilirubin: 0.8 mg/dL (ref 0.3–1.2)
Total Protein: 7.5 g/dL (ref 6.5–8.1)

## 2019-02-06 LAB — LACTATE DEHYDROGENASE: LDH: 235 U/L — ABNORMAL HIGH (ref 98–192)

## 2019-02-06 LAB — TROPONIN I (HIGH SENSITIVITY)
Troponin I (High Sensitivity): 37 ng/L — ABNORMAL HIGH (ref <18)
Troponin I (High Sensitivity): 35 ng/L — ABNORMAL HIGH (ref <18)

## 2019-02-06 LAB — URINALYSIS, ROUTINE W REFLEX MICROSCOPIC
Bilirubin Urine: NEGATIVE
Glucose, UA: NEGATIVE mg/dL
Hgb urine dipstick: NEGATIVE
Ketones, ur: NEGATIVE mg/dL
Leukocytes,Ua: NEGATIVE
Nitrite: NEGATIVE
Protein, ur: NEGATIVE mg/dL
Specific Gravity, Urine: 1.014 (ref 1.005–1.030)
pH: 5 (ref 5.0–8.0)

## 2019-02-06 LAB — FERRITIN: Ferritin: 270 ng/mL (ref 24–336)

## 2019-02-06 LAB — LACTIC ACID, PLASMA
Lactic Acid, Venous: 2.4 mmol/L (ref 0.5–1.9)
Lactic Acid, Venous: 1.6 mmol/L (ref 0.5–1.9)

## 2019-02-06 LAB — D-DIMER, QUANTITATIVE: D-Dimer, Quant: 0.41 ug/mL-FEU (ref 0.00–0.50)

## 2019-02-06 LAB — FIBRINOGEN: Fibrinogen: 511 mg/dL — ABNORMAL HIGH (ref 210–475)

## 2019-02-06 LAB — PROCALCITONIN: Procalcitonin: 0.27 ng/mL

## 2019-02-06 LAB — ABO/RH: ABO/RH(D): O POS

## 2019-02-06 LAB — TRIGLYCERIDES: Triglycerides: 210 mg/dL — ABNORMAL HIGH (ref <150)

## 2019-02-06 MED ORDER — GUAIFENESIN-DM 100-10 MG/5ML PO SYRP
10.0000 mL | ORAL_SOLUTION | ORAL | Status: DC | PRN
  Administered 2019-02-07: 10 mL via ORAL
  Filled 2019-02-06: qty 10

## 2019-02-06 MED ORDER — INSULIN GLARGINE 100 UNIT/ML ~~LOC~~ SOLN
35.0000 [IU] | Freq: Every morning | SUBCUTANEOUS | Status: DC
  Administered 2019-02-07 – 2019-02-09 (×3): 35 [IU] via SUBCUTANEOUS
  Filled 2019-02-06 (×3): qty 0.35

## 2019-02-06 MED ORDER — ASPIRIN EC 81 MG PO TBEC
81.0000 mg | DELAYED_RELEASE_TABLET | Freq: Every day | ORAL | Status: DC
  Administered 2019-02-07 – 2019-02-09 (×3): 81 mg via ORAL
  Filled 2019-02-06 (×3): qty 1

## 2019-02-06 MED ORDER — ONDANSETRON HCL 4 MG/2ML IJ SOLN: 4.0000 mg | Freq: Four times a day (QID) | INTRAMUSCULAR | Status: DC | PRN

## 2019-02-06 MED ORDER — INSULIN ASPART 100 UNIT/ML ~~LOC~~ SOLN
0.0000 [IU] | Freq: Three times a day (TID) | SUBCUTANEOUS | Status: DC
  Administered 2019-02-07: 1 [IU] via SUBCUTANEOUS
  Administered 2019-02-07: 2 [IU] via SUBCUTANEOUS
  Administered 2019-02-09 (×2): 1 [IU] via SUBCUTANEOUS

## 2019-02-06 MED ORDER — LACTATED RINGERS IV BOLUS
1000.0000 mL | Freq: Once | INTRAVENOUS | Status: AC
  Administered 2019-02-06: 1000 mL via INTRAVENOUS

## 2019-02-06 MED ORDER — SODIUM CHLORIDE 0.9 % IV SOLN: INTRAVENOUS | Status: DC

## 2019-02-06 MED ORDER — ATORVASTATIN CALCIUM 80 MG PO TABS
80.0000 mg | ORAL_TABLET | Freq: Every day | ORAL | Status: DC
  Administered 2019-02-07 – 2019-02-09 (×3): 80 mg via ORAL
  Filled 2019-02-06 (×3): qty 1

## 2019-02-06 MED ORDER — ACETAMINOPHEN 325 MG PO TABS
650.0000 mg | ORAL_TABLET | Freq: Four times a day (QID) | ORAL | Status: DC | PRN
  Administered 2019-02-07 – 2019-02-09 (×2): 650 mg via ORAL
  Filled 2019-02-06 (×2): qty 2

## 2019-02-06 MED ORDER — ENOXAPARIN SODIUM 30 MG/0.3ML ~~LOC~~ SOLN
30.0000 mg | SUBCUTANEOUS | Status: DC
  Administered 2019-02-06 – 2019-02-08 (×3): 30 mg via SUBCUTANEOUS
  Filled 2019-02-06 (×3): qty 0.3

## 2019-02-06 MED ORDER — ONDANSETRON HCL 4 MG PO TABS: 4.0000 mg | ORAL_TABLET | Freq: Four times a day (QID) | ORAL | Status: DC | PRN

## 2019-02-06 MED ORDER — CLOPIDOGREL BISULFATE 75 MG PO TABS
75.0000 mg | ORAL_TABLET | Freq: Every day | ORAL | Status: DC
  Administered 2019-02-07 – 2019-02-09 (×3): 75 mg via ORAL
  Filled 2019-02-06 (×3): qty 1

## 2019-02-06 MED ORDER — ALBUTEROL SULFATE HFA 108 (90 BASE) MCG/ACT IN AERS
2.0000 | INHALATION_SPRAY | Freq: Four times a day (QID) | RESPIRATORY_TRACT | Status: DC | PRN
  Filled 2019-02-06: qty 6.7

## 2019-02-06 NOTE — ED Provider Notes (Signed)
[name] Provider Note   [name] Arrival date & time: 02/06/19  1353     History Chief Complaint  Patient presents with  . Shortness of Breath    [name] is a 64 y.o. male.  HPI      64 year old male with a history of nonischemic cardiomyopathy, congestive heart failure, diabetes, hypertension, hyperlipidemia, PVCs, SVT post ablation, CKD, COVID positive 1/6 presents with concern for generalized weakness, fatigue, shortness of breath.  Reports 5 days ago developed myalgias, cough, fatigue. Went to ED and tested positive for COVID the next day.  Since then, he has had progressive fatigue, present at rest but sensation of exhaustion with minimal activity. Has not been able to eat over the last few days and has not even wanted to drink water.  Cough was productive of green sputum initially, now clear.  Short of breath with minimal activity.  DIarrhea began in last 2 days, probably 4 episodes per day. No black or bloody stools. No vomiting.   Saturation 93% on room air on FD arrival to home. BP into 80s with EMS>   Past Medical History:  Diagnosis Date  . Asthma   . CAD (coronary artery disease)    a. non obst dz by cath 2004  b. 11/12/15 which showed no obvious large vessel CAD. No PCI or clear reason for NSTEMI ( possibly hypertensive urgency?).   [name] CARDIOMYOPATHY    a. NICM, LVEF 30% 01/2011 echo; 20-25% 07/2012 echo, EF recovered by echo 06/2015.  . Congestive heart failure (CHF) ([place])   . DIABETES MELLITUS, TYPE II   . HYPERCHOLESTEROLEMIA   . HYPERTENSION   . Myocardial infarct ([place]) 10/2015  . Noncompliance   . Prostate cancer [facility])    prostate cancer  . PVCs (premature ventricular contractions)    a. s/p ablation 01-07-2013 by Dr Rayann Heman    Patient Active Problem List   Diagnosis Date Noted  . Acute on chronic renal failure ([place]) 02/06/2019  . Routine general medical examination at a health care facility  03/12/2018  . Balanitis 10/15/2016  . NSTEMI (non-ST elevated myocardial infarction) ([place]) 11/13/2015  . PVCs (premature ventricular contractions)   . CAD (coronary artery disease)   . Acute kidney injury ([place]) 07/19/2015  . AKI (acute kidney injury) ([place]) 07/19/2015  . Hyperglycemia 07/12/2015  . Prostate cancer ([place]) 02/22/2014  . Cholelithiasis and cholecystitis without obstruction 11/16/2013  . PVC's (premature ventricular contractions) 01/07/2013  . CKD (chronic kidney disease) stage 2, GFR 60-89 ml/min 08/25/2012  . OSA (obstructive sleep apnea) 08/25/2012  . Chronic systolic heart failure ([place]) 08/06/2012  . HTN (hypertension), malignant 02/12/2011  . Non compliance w medication regimen 02/12/2011  . HYPERCHOLESTEROLEMIA 09/17/2009  . PREMATURE VENTRICULAR CONTRACTIONS 09/17/2009  . PSA, INCREASED 04/26/2009  . Diabetes type 2, uncontrolled ([place]) 04/25/2009  . CARDIOMYOPATHY 04/25/2009    Past Surgical History:  Procedure Laterality Date  . ABLATION  01-07-2013   RVOT PVC's ablated by Dr Rayann Heman along anteroseptal RVOT  . CARDIAC CATHETERIZATION  2004   nonobst dz  . CARDIAC CATHETERIZATION N/A 11/12/2015   Procedure: Left Heart Cath and Coronary Angiography;  Surgeon: Sherren Mocha, MD;  Location: [facility];  Service: Cardiovascular;  Laterality: N/A;  . CHOLECYSTECTOMY N/A 11/16/2013   Procedure: LAPAROSCOPIC CHOLECYSTECTOMY WITH INTRAOPERATIVE CHOLANGIOGRAM;  Surgeon: [name], MD;  Location: [facility];  Service: General;  Laterality: N/A;  . LYMPHADENECTOMY Bilateral 02/22/2014   Procedure: PELVIC LYMPH NODE  DISSECTION;  Surgeon: [name], MD;  Location: [facility];  Service: Urology;  Laterality: Bilateral;  . PROSTATE SURGERY    . ROBOT ASSISTED LAPAROSCOPIC RADICAL PROSTATECTOMY N/A 02/22/2014   Procedure: ROBOTIC ASSISTED LAPAROSCOPIC RADICAL PROSTATECTOMY WITH INDOCYANINE GREEN DYE AND OPEN UMBILICAL HERNIA REPAIR;  Surgeon: [name], MD;   Location: [facility];  Service: Urology;  Laterality: N/A;  . SUPRAVENTRICULAR TACHYCARDIA ABLATION N/A 09/28/2012   Procedure: Tanna Furry Ablation;  Surgeon: [name], MD;  Location: [facility] CATH LAB;  Service: Cardiovascular;  Laterality: N/A;  . SURGERY SCROTAL / TESTICULAR     at age 58  . V-TACH ABLATION N/A 01/07/2013   Procedure: V-TACH ABLATION;  Surgeon: [name], MD;  Location: [facility] CATH LAB;  Service: Cardiovascular;  Laterality: N/A;       Family History  Problem Relation Age of Onset  . Hypertension Mother   . Peripheral vascular disease Mother   . Diabetes Mother   . Peripheral vascular disease Other   . Hypertension Other   . Prostate cancer Other   . Prostate cancer Brother   . Lung cancer Brother   . Heart attack Paternal Grandfather   . Heart failure Maternal Grandmother   . Diabetes Son   . Colon cancer Sister   . Prostate cancer Brother   . Prostate cancer Brother   . Prostate cancer Brother   . Pancreatic cancer Paternal Aunt   . Esophageal cancer Neg Hx     Social History   Tobacco Use  . Smoking status: Never Smoker  . Smokeless tobacco: Never Used  . 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 Alcohol use: Yes    Comment: rare  . Drug use: No    Home Medications Prior to Admission medications   Medication Sig Start Date End Date Taking? Authorizing Provider  aspirin EC 81 MG EC tablet Take 1 tablet (81 mg total) by mouth daily. 11/14/15  Yes Eileen Stanford, PA-C  atorvastatin (LIPITOR) 80 MG tablet Take 1 tablet (80 mg total) by mouth daily. 08/23/18  Yes Marrian Salvage, FNP  carvedilol (COREG) 25 MG tablet Take 1 tablet (25 mg total) by mouth 2 (two) times daily with a meal. 03/29/18  Yes Isaiah Serge, NP  clopidogrel (PLAVIX) 75 MG tablet Take 1 tablet (75 mg total) by mouth daily. **NEED TO ESTABLISH WITH NEW PROVIDER FOR FUTURE REFILLS* 08/02/18  Yes Plotnikov, Evie Lacks, MD    hydrochlorothiazide (HYDRODIURIL) 25 MG tablet Take 1 tablet by mouth once daily Patient taking differently: Take 25 mg by mouth daily.  12/29/18  Yes [name], NP  Insulin Glargine (LANTUS) 100 UNIT/ML Solostar Pen Inject 70 Units into the skin every morning. And pen needles 2/day 05/31/18  Yes Renato Shin, MD  isosorbide mononitrate (IMDUR) 60 MG 24 hr tablet TAKE 1 & 1/2 (ONE & ONE-HALF) TABLETS BY MOUTH ONCE DAILY Patient taking differently: Take 90 mg by mouth daily.  12/17/18  Yes [name], NP  lisinopril (ZESTRIL) 40 MG tablet Take 1 tablet by mouth once daily Patient taking differently: Take 40 mg by mouth daily.  12/30/18  Yes [name], NP    Allergies    Ambien [zolpidem tartrate] and Metformin and related  Review of Systems   Review of Systems  Physical Exam Updated Vital Signs BP 106/64   Pulse 70   Temp 98.2 F (36.8 C) (Oral)   Resp  14   SpO2 97%   Physical Exam  ED Results / Procedures / Treatments   Labs (all labs ordered are listed, but only abnormal results are displayed) Labs Reviewed  LACTIC ACID, PLASMA - Abnormal; Notable for the following components:      Result Value   Lactic Acid, Venous 2.4 (*)    All other components within normal limits  COMPREHENSIVE METABOLIC PANEL - Abnormal; Notable for the following components:   CO2 20 (*)    Glucose, Bld 182 (*)    BUN 51 (*)    Creatinine, Ser 3.78 (*)    Calcium 8.8 (*)    Albumin 3.4 (*)    AST 46 (*)    GFR calc non Af Amer 16 (*)    GFR calc Af Amer 18 (*)    All other components within normal limits  LACTATE DEHYDROGENASE - Abnormal; Notable for the following components:   LDH 235 (*)    All other components within normal limits  TRIGLYCERIDES - Abnormal; Notable for the following components:   Triglycerides 210 (*)    All other components within normal limits  FIBRINOGEN - Abnormal; Notable for the following components:   Fibrinogen 511 (*)    All other components  within normal limits  C-REACTIVE PROTEIN - Abnormal; Notable for the following components:   CRP 5.7 (*)    All other components within normal limits  TROPONIN I (HIGH SENSITIVITY) - Abnormal; Notable for the following components:   Troponin I (High Sensitivity) 37 (*)    All other components within normal limits  TROPONIN I (HIGH SENSITIVITY) - Abnormal; Notable for the following components:   Troponin I (High Sensitivity) 35 (*)    All other components within normal limits  CULTURE, BLOOD (ROUTINE X 2)  CULTURE, BLOOD (ROUTINE X 2)  C DIFFICILE QUICK SCREEN W PCR REFLEX  LACTIC ACID, PLASMA  CBC WITH DIFFERENTIAL/PLATELET  D-DIMER, QUANTITATIVE (NOT AT [facility])  PROCALCITONIN  FERRITIN  URINALYSIS, ROUTINE W REFLEX MICROSCOPIC  CBC WITH DIFFERENTIAL/PLATELET  COMPREHENSIVE METABOLIC PANEL  C-REACTIVE PROTEIN  D-DIMER, QUANTITATIVE (NOT AT [facility])  MAGNESIUM  PHOSPHORUS  HEMOGLOBIN A1C  ABO/RH    EKG EKG Interpretation  Date/Time:  Sunday February 06 2019 14:54:36 EST Ventricular Rate:  78 PR Interval:    QRS Duration: 119 QT Interval:  401 QTC Calculation: 457 R Axis:   -57 Text Interpretation: Sinus rhythm Incomplete left bundle branch block LVH with secondary repolarization abnormality No significant change since last tracing Confirmed by Gareth Morgan [phone]) on 02/06/2019 5:02:42 PM   Radiology DG Chest Port 1 View  Result Date: 02/06/2019 CLINICAL DATA:  Shortness of breath EXAM: PORTABLE CHEST 1 VIEW COMPARISON:  February 02, 2019 FINDINGS: There is slight left base atelectasis. Lungs elsewhere clear. Heart size and pulmonary vascularity are normal. No adenopathy. No bone lesions. IMPRESSION: Slight left base atelectasis. No edema or consolidation. Cardiac silhouette normal. No adenopathy. Electronically Signed   By: Lowella Grip III M.D.   On: 02/06/2019 15:31    Procedures .Critical Care Performed by: Gareth Morgan, MD Authorized by: Gareth Morgan,  MD   Critical care provider statement:    Critical care time (minutes):  45   Critical care was time spent personally by me on the following activities:  Evaluation of patient's response to treatment, examination of patient, ordering and performing treatments and interventions, ordering and review of laboratory studies, ordering and review of radiographic studies, pulse oximetry, re-evaluation of patient's condition, obtaining history  from patient or surrogate and review of old charts   (including critical care time)  Medications Ordered in ED Medications  aspirin EC tablet 81 mg (has no administration in time range)  atorvastatin (LIPITOR) tablet 80 mg (has no administration in time range)  clopidogrel (PLAVIX) tablet 75 mg (has no administration in time range)  enoxaparin (LOVENOX) injection 30 mg (30 mg Subcutaneous Given 02/06/19 2121)  0.9 %  sodium chloride infusion ( Intravenous New Bag/Given 02/06/19 1845)  guaiFENesin-dextromethorphan (ROBITUSSIN DM) 100-10 MG/5ML syrup 10 mL (has no administration in time range)  albuterol (VENTOLIN HFA) 108 (90 Base) MCG/ACT inhaler 2 puff (has no administration in time range)  acetaminophen (TYLENOL) tablet 650 mg (has no administration in time range)  ondansetron (ZOFRAN) tablet 4 mg (has no administration in time range)    Or  ondansetron (ZOFRAN) injection 4 mg (has no administration in time range)  insulin glargine (LANTUS) injection 35 Units (has no administration in time range)  insulin aspart (novoLOG) injection 0-9 Units (has no administration in time range)  lactated ringers bolus 1,000 mL (0 mLs Intravenous Stopped 02/06/19 1621)  lactated ringers bolus 1,000 mL (0 mLs Intravenous Stopped 02/06/19 1620)    ED Course  I have reviewed the triage vital signs and the nursing notes.  Pertinent labs & imaging results that were available during my care of the patient were reviewed by me and considered in my medical decision making (see chart  for details).    MDM Rules/Calculators/A&P                      64 year old male with a history of nonischemic cardiomyopathy, congestive heart failure, diabetes, hypertension, hyperlipidemia, PVCs, SVT post ablation, CKD, COVID positive 1/6 presents with concern for generalized weakness, fatigue, shortness of breath.  Pt without hypoxia in the ED but with blood pressures down to the 80s.  By history, suspect related to dehydration in setting of COVID 19 and patient continuing to take antihypertensive medications.  Given 1L of LR with continued hypotension, given additional 1L with improvement, MAPS in [id number], systolic [id number].  Labs significant for acute on chronic renal failure, also likely in setting of decreased po intake and diarrhea.  Admitted for continued care for COVID 19 infection, acute on chronic kidney disease and dehydration.   Final Clinical Impression(s) / ED Diagnoses Final diagnoses:  COVID-19  Acute renal failure superimposed on stage 3 chronic kidney disease, unspecified acute renal failure type, unspecified whether stage 3a or 3b CKD (HCC)  Hypotension, unspecified hypotension type  Dehydration    Rx / DC Orders ED Discharge Orders    None       Gareth Morgan, MD 02/07/19 0020

## 2019-02-06 NOTE — H&P (Signed)
[facility] History and Physical  [name] [id number] DOB: Jun 23, 1955 DOA: 02/06/2019  Referring physician: ED provider PCP: Marrian Salvage, FNP   Chief Complaint: Weakness, diarrhea, fatigue, cough  HPI: [name] is a 64 y.o. male with PMH of DM2 on insulin, HTN, PVCs and Vtach s/p ablation, CKD3 with baseline Scr of 1.6-1.9, cardiomyopathy with EF of 50-55% and grd 1 diastolic dysfunction, non obstructive CAD, presenting to the ED via EMS for evaluation of weakness, fatigue, diarrhea x3-4 days. He reports feeling very weak today with poor oral intake. He had been diagnosed with COVID-19 on 1/6 when he was seen at the ED for body aches and dizziness. He reports that since that visit he has felt worse. He has been taking his medications, including his antihypertensives but had decrease oral intake. His symptoms have persisted and progressively worsened and are described as moderate in intensity.   In the ED: Labwork was remarkable for elevated Scr of 3.78 and BUN of 51. His troponin was mildly elevated at 37. His EKG had no acute changes. He denied chest pain while in the ED. His CXR was negative for consolidation. His O2 sats were stable but his blood pressure was low, initially at 84/62 but improved with 2L of bolus of IV fluids with BP up to 125/68. The hospitalist was called for this admission to the hospital.    Review of Systems:  Constitutional:  + Fevers, +chills, +fatigue.  HEENT:  No headaches, Difficulty swallowing Cardio-vascular:  No chest pain, or swelling in lower extremities GI:  No heartburn, indigestion, abdominal pain, + diarrhe Resp:  + shortness of breath with exertion or at rest. No wheezing. Skin:  no rash or lesions.  GU:  no dysuria, change in color of urine.  Musculoskeletal:  No joint pain or swelling. No decreased range of motion.  Psych:  No change in mood or affect. No depression or anxiety.  Heme: No spontaneous  bleeding, no easy bruising.   Past Medical History:  Diagnosis Date   Asthma    CAD (coronary artery disease)    a. non obst dz by cath 2004  b. 11/12/15 which showed no obvious large vessel CAD. No PCI or clear reason for NSTEMI ( possibly hypertensive urgency?).    CARDIOMYOPATHY    a. NICM, LVEF 30% 01/2011 echo; 20-25% 07/2012 echo, EF recovered by echo 06/2015.   Congestive heart failure (CHF) ([place])    DIABETES MELLITUS, TYPE II    HYPERCHOLESTEROLEMIA    HYPERTENSION    Myocardial infarct ([place]) 10/2015   Noncompliance    Prostate cancer ([place])    prostate cancer   PVCs (premature ventricular contractions)    a. s/p ablation 01-07-2013 by Dr Rayann Heman   Past Surgical History:  Procedure Laterality Date   ABLATION  01-07-2013   RVOT PVC's ablated by Dr Rayann Heman along anteroseptal RVOT   CARDIAC CATHETERIZATION  2004   nonobst dz   CARDIAC CATHETERIZATION N/A 11/12/2015   Procedure: Left Heart Cath and Coronary Angiography;  Surgeon: Sherren Mocha, MD;  Location: [facility] CV LAB;  Service: Cardiovascular;  Laterality: N/A;   CHOLECYSTECTOMY N/A 11/16/2013   Procedure: LAPAROSCOPIC CHOLECYSTECTOMY WITH INTRAOPERATIVE CHOLANGIOGRAM;  Surgeon: [name], MD;  [facility];  Service: General;  Laterality: N/A;   LYMPHADENECTOMY Bilateral 02/22/2014   Procedure: PELVIC LYMPH NODE DISSECTION;  Surgeon: [name], MD;  [facility];  Service: Urology;  Laterality: Bilateral;   PROSTATE SURGERY     ROBOT  ASSISTED LAPAROSCOPIC RADICAL PROSTATECTOMY N/A 02/22/2014   Procedure: ROBOTIC ASSISTED LAPAROSCOPIC RADICAL PROSTATECTOMY WITH INDOCYANINE GREEN DYE AND OPEN UMBILICAL HERNIA REPAIR;  Surgeon: [name], MD;  [facility];  Service: Urology;  Laterality: N/A;   SUPRAVENTRICULAR TACHYCARDIA ABLATION N/A 09/28/2012   Procedure: Tanna Furry Ablation;  Surgeon: [name], MD;  Location: [facility] CATH LAB;  Service: Cardiovascular;  Laterality: N/A;   SURGERY SCROTAL /  TESTICULAR     at age 72   V-TACH ABLATION N/A 01/07/2013   Procedure: V-TACH ABLATION;  Surgeon: [name], MD;  Location: [facility] CATH LAB;  Service: Cardiovascular;  Laterality: N/A;   Social History:  reports that he has never smoked. He has never used smokeless tobacco. He reports current alcohol use. He reports that he does not use drugs.  Allergies  Allergen Reactions   Ambien [Zolpidem Tartrate] Other (See Comments)    Pt states that this medication made him go crazy.    Metformin And Related Diarrhea    Family History  Problem Relation Age of Onset   Hypertension Mother    Peripheral vascular disease Mother    Diabetes Mother    Peripheral vascular disease Other    Hypertension Other    Prostate cancer Other    Prostate cancer Brother    Lung cancer Brother    Heart attack Paternal Grandfather    Heart failure Maternal Grandmother    Diabetes Son    Colon cancer Sister    Prostate cancer Brother    Prostate cancer Brother    Prostate cancer Brother    Pancreatic cancer Paternal Aunt    Esophageal cancer Neg Hx     Prior to Admission medications   Medication Sig Start Date End Date Taking? Authorizing Provider  aspirin EC 81 MG EC tablet Take 1 tablet (81 mg total) by mouth daily. 11/14/15  Yes Eileen Stanford, PA-C  atorvastatin (LIPITOR) 80 MG tablet Take 1 tablet (80 mg total) by mouth daily. 08/23/18  Yes Marrian Salvage, FNP  carvedilol (COREG) 25 MG tablet Take 1 tablet (25 mg total) by mouth 2 (two) times daily with a meal. 03/29/18  Yes Isaiah Serge, NP  clopidogrel (PLAVIX) 75 MG tablet Take 1 tablet (75 mg total) by mouth daily. **NEED TO ESTABLISH WITH NEW PROVIDER FOR FUTURE REFILLS* 08/02/18  Yes Plotnikov, Evie Lacks, MD  hydrochlorothiazide (HYDRODIURIL) 25 MG tablet Take 1 tablet by mouth once daily Patient taking differently: Take 25 mg by mouth daily.  12/29/18  Yes [name], NP  Insulin Glargine (LANTUS) 100 UNIT/ML Solostar Pen  Inject 70 Units into the skin every morning. And pen needles 2/day 05/31/18  Yes Renato Shin, MD  isosorbide mononitrate (IMDUR) 60 MG 24 hr tablet TAKE 1 & 1/2 (ONE & ONE-HALF) TABLETS BY MOUTH ONCE DAILY Patient taking differently: Take 90 mg by mouth daily.  12/17/18  Yes [name], NP  lisinopril (ZESTRIL) 40 MG tablet Take 1 tablet by mouth once daily Patient taking differently: Take 40 mg by mouth daily.  12/30/18  Yes [name], NP   Physical Exam: Vitals:   02/06/19 1450 02/06/19 1515 02/06/19 1545 02/06/19 1615  BP: (!) 94/59 (!) 94/45 (!) 111/98 125/68  Pulse: 80 77 62 81  Resp: 16 17 (!) 21 (!) 21  Temp:      TempSrc:      SpO2: 97% 100% 100% 98%    Wt Readings from Last 3 Encounters:  12/02/18 85.7  kg  11/22/18 85.9 kg  10/11/18 84.8 kg    General:  Appears calm and comfortable Eyes: PERRL, normal lids, irises & conjunctiva ENT: grossly normal hearing, lips & tongue. Dry MM Neck: nl ROM, supple Cardiovascular: RRR. No LE edema. Respiratory: Normal respiratory effort. No wheezing.  Abdomen: soft, ntnd Skin: no rash or induration seen on limited exam Musculoskeletal: grossly normal tone BUE/BLE Psychiatric: normal mood and affect, speech fluent and appropriate Neurologic: Alert, oriented x3, no facial droop, following commands, moves ext x4 GU: No CVA tenderness, no suprapubic tenderness.           Labs on Admission:  Basic Metabolic Panel: Recent Labs  Lab 02/02/19 1035 02/06/19 1418  NA 140 139  K 3.1* 3.9  CL 106 105  CO2 25 20*  GLUCOSE 123* 182*  BUN 18 51*  CREATININE 1.91* 3.78*  CALCIUM 8.9 8.8*  MG 1.4*  --    Liver Function Tests: Recent Labs  Lab 02/02/19 1035 02/06/19 1418  AST 62* 46*  ALT 54* 40  ALKPHOS 69 58  BILITOT 0.3 0.8  PROT 7.3 7.5  ALBUMIN 3.3* 3.4*   No results for input(s): LIPASE, AMYLASE in the last 168 hours. No results for input(s): AMMONIA in the last 168 hours. CBC: Recent Labs  Lab  02/02/19 1035 02/06/19 1418  WBC 10.6* 5.8  NEUTROABS 7.2 3.2  HGB 12.8* 14.0  HCT 38.8* 42.6  MCV 89.6 89.5  PLT 216 218   Cardiac Enzymes: No results for input(s): CKTOTAL, CKMB, CKMBINDEX, TROPONINI in the last 168 hours.  BNP (last 3 results) No results for input(s): BNP in the last 8760 hours.  ProBNP (last 3 results) Recent Labs    03/29/18 1621  PROBNP 129    CBG: No results for input(s): GLUCAP in the last 168 hours.  Radiological Exams on Admission: DG Chest Port 1 View  Result Date: 02/06/2019 CLINICAL DATA:  Shortness of breath EXAM: PORTABLE CHEST 1 VIEW COMPARISON:  February 02, 2019 FINDINGS: There is slight left base atelectasis. Lungs elsewhere clear. Heart size and pulmonary vascularity are normal. No adenopathy. No bone lesions. IMPRESSION: Slight left base atelectasis. No edema or consolidation. Cardiac silhouette normal. No adenopathy. Electronically Signed   By: Lowella Grip III M.D.   On: 02/06/2019 15:31    EKG: SR, HR 78, no acute ST changes.   Assessment/Plan Active Problems:   Acute on chronic renal failure (HCC)   Acute on chronic renal failure: Baseline Scr of 1.6-1.9. Likely multifactorial, due to prerenal azotemia but possible ATN due to hypotention.  -Admit to telemetry -Monitor UOP -Avoid nephrotoxins, hold ACE -Check UA -Continue IV fluids -Repeat labs in am -Consider renal US if not improving--patient denies symptoms of urinary retention -Consider Nephrology consultation if not improving.   2. Hypotention:  Improving. In setting of hypovolemia with diarrhea and continuation of antihypertensives.  Monitor closely, continue IV fluids for BP support. Hold home antihypertensives.   3. COVID-19 pneumonia Not needing O2 supplementation but monitor closely. Will not start Dexamethasone at this time.  Monitor Scr, if renal function improving, may be a candidate for remdesivir.  Continue COVID-19 lab marker trend  4. Elevated  troponin In setting of renal failure with no EKG changes, no chest pain.  Continue home ASA and Plavix  Trend troponin  5. Diarrhea.  Likely due to COVID19 gastroenteritis. Continue supportive care. Check stool for c diff.   6. Dm2.  Reduce Lantus dose of 70u am to 35 un  am due to worsening renal function. Start SSI  7. Hx of PVCs and Vtach.  Stable HR so far. Hold BB due to hypotension.   8. Hx of grd 1 diastolic dysfunction.  Appears hypovolemic.  Continue IV fluids and monitor fluid status closely.    Code Status: Full DVT Prophylaxis: Lovenox, renally dosed Family Communication: Discussed with the patient Disposition Plan: Admit to IP with anticipated at least 2MN stay.   Time spent: 70 minutes  [facility]

## 2019-02-06 NOTE — ED Triage Notes (Signed)
Pt arrives from home via [facility] c/o increased SOB with diarrhea since yesterday, decreased appetite, increased fatigue.

## 2019-02-06 NOTE — ED Notes (Signed)
Pt states, "I feel not right, like restless."  BP dropping.  MD made aware.

## 2019-02-06 NOTE — ED Notes (Signed)
Pt reports he is feeling a little better. IVF complete, pressures are maintaining between [id number] systolic with map around 75. Pt given barrier cream for his legs r/t chafing.

## 2019-02-07 ENCOUNTER — Ambulatory Visit (HOSPITAL_COMMUNITY): Payer: BC Managed Care – PPO

## 2019-02-07 DIAGNOSIS — E1165 Type 2 diabetes mellitus with hyperglycemia: Secondary | ICD-10-CM

## 2019-02-07 DIAGNOSIS — U071 COVID-19: Secondary | ICD-10-CM | POA: Diagnosis present

## 2019-02-07 DIAGNOSIS — I2510 Atherosclerotic heart disease of native coronary artery without angina pectoris: Secondary | ICD-10-CM

## 2019-02-07 DIAGNOSIS — R197 Diarrhea, unspecified: Secondary | ICD-10-CM | POA: Diagnosis present

## 2019-02-07 LAB — C DIFFICILE QUICK SCREEN W PCR REFLEX
C Diff antigen: NEGATIVE
C Diff interpretation: NOT DETECTED
C Diff toxin: NEGATIVE

## 2019-02-07 LAB — COMPREHENSIVE METABOLIC PANEL
ALT: 33 U/L (ref 0–44)
AST: 40 U/L (ref 15–41)
Albumin: 2.9 g/dL — ABNORMAL LOW (ref 3.5–5.0)
Alkaline Phosphatase: 47 U/L (ref 38–126)
Anion gap: 9 (ref 5–15)
BUN: 38 mg/dL — ABNORMAL HIGH (ref 8–23)
CO2: 22 mmol/L (ref 22–32)
Calcium: 8.1 mg/dL — ABNORMAL LOW (ref 8.9–10.3)
Chloride: 110 mmol/L (ref 98–111)
Creatinine, Ser: 2.12 mg/dL — ABNORMAL HIGH (ref 0.61–1.24)
GFR calc Af Amer: 37 mL/min — ABNORMAL LOW (ref >60)
GFR calc non Af Amer: 32 mL/min — ABNORMAL LOW (ref >60)
Glucose, Bld: 162 mg/dL — ABNORMAL HIGH (ref 70–99)
Potassium: 3.9 mmol/L (ref 3.5–5.1)
Sodium: 141 mmol/L (ref 135–145)
Total Bilirubin: 1 mg/dL (ref 0.3–1.2)
Total Protein: 6.4 g/dL — ABNORMAL LOW (ref 6.5–8.1)

## 2019-02-07 LAB — CBC WITH DIFFERENTIAL/PLATELET
Abs Immature Granulocytes: 0.02 10*3/uL (ref 0.00–0.07)
Basophils Absolute: 0 10*3/uL (ref 0.0–0.1)
Basophils Relative: 0 %
Eosinophils Absolute: 0 10*3/uL (ref 0.0–0.5)
Eosinophils Relative: 0 %
HCT: 37.8 % — ABNORMAL LOW (ref 39.0–52.0)
Hemoglobin: 12.1 g/dL — ABNORMAL LOW (ref 13.0–17.0)
Immature Granulocytes: 0 %
Lymphocytes Relative: 33 %
Lymphs Abs: 1.9 10*3/uL (ref 0.7–4.0)
MCH: 29.4 pg (ref 26.0–34.0)
MCHC: 32 g/dL (ref 30.0–36.0)
MCV: 92 fL (ref 80.0–100.0)
Monocytes Absolute: 0.6 10*3/uL (ref 0.1–1.0)
Monocytes Relative: 11 %
Neutro Abs: 3.2 10*3/uL (ref 1.7–7.7)
Neutrophils Relative %: 56 %
Platelets: 194 10*3/uL (ref 150–400)
RBC: 4.11 MIL/uL — ABNORMAL LOW (ref 4.22–5.81)
RDW: 12.7 % (ref 11.5–15.5)
WBC: 5.7 10*3/uL (ref 4.0–10.5)
nRBC: 0 % (ref 0.0–0.2)

## 2019-02-07 LAB — D-DIMER, QUANTITATIVE: D-Dimer, Quant: 0.34 ug/mL-FEU (ref 0.00–0.50)

## 2019-02-07 LAB — PHOSPHORUS: Phosphorus: 2.7 mg/dL (ref 2.5–4.6)

## 2019-02-07 LAB — CBG MONITORING, ED
Glucose-Capillary: 141 mg/dL — ABNORMAL HIGH (ref 70–99)
Glucose-Capillary: 153 mg/dL — ABNORMAL HIGH (ref 70–99)
Glucose-Capillary: 108 mg/dL — ABNORMAL HIGH (ref 70–99)

## 2019-02-07 LAB — HEMOGLOBIN A1C
Hgb A1c MFr Bld: 9.2 % — ABNORMAL HIGH (ref 4.8–5.6)
Mean Plasma Glucose: 217.34 mg/dL

## 2019-02-07 LAB — MAGNESIUM: Magnesium: 1.9 mg/dL (ref 1.7–2.4)

## 2019-02-07 LAB — C-REACTIVE PROTEIN: CRP: 5.1 mg/dL — ABNORMAL HIGH (ref <1.0)

## 2019-02-07 MED ORDER — SODIUM CHLORIDE 0.9 % IV SOLN
200.0000 mg | Freq: Once | INTRAVENOUS | Status: AC
  Administered 2019-02-07: 200 mg via INTRAVENOUS
  Filled 2019-02-07: qty 200

## 2019-02-07 MED ORDER — SODIUM CHLORIDE 0.9 % IV SOLN
100.0000 mg | Freq: Every day | INTRAVENOUS | Status: DC
  Administered 2019-02-08 – 2019-02-09 (×2): 100 mg via INTRAVENOUS
  Filled 2019-02-07 (×2): qty 20
  Filled 2019-02-07: qty 100

## 2019-02-07 MED ORDER — PNEUMOCOCCAL VAC POLYVALENT 25 MCG/0.5ML IJ INJ
0.5000 mL | INJECTION | INTRAMUSCULAR | Status: AC
  Administered 2019-02-08: 0.5 mL via INTRAMUSCULAR

## 2019-02-07 NOTE — Progress Notes (Signed)
PROGRESS NOTE    [name]  [id number] DOB: 03/13/1955 DOA: 02/06/2019 PCP: Marrian Salvage, FNP    Brief Narrative:  62 yr man with PMH of DM2 on insulin HTN, PVCs, Vtach s/p ablation yrs ago, CKD3 with bln Scr of 1.6-1.9, cardiomyopathy with EF of 99991111 and grd1 diastolic dysfunction, non obstructive CAD, presented to the ED vias EMS with weakness and diarrhea x3 days, found to have COVID, hypotension with SBP in the low 80s and AKI.    Assessment & Plan:   Principal Problem:   Acute on chronic renal failure (HCC) Active Problems:   Diabetes type 2, uncontrolled (HCC)   HTN (hypertension), malignant   PVCs (premature ventricular contractions)   CAD (coronary artery disease)   COVID-19 virus infection   Diarrhea   1. Acute on chronic renal failure: Baseline Scr of 1.6-1.9. Likely multifactorial, due to prerenal azotemia but possible ATN due to hypotention. UA unremarkable.  -Improving with IV fluids which will be continued.  -Monitor UOP -Avoid nephrotoxins, hold ACE -Repeat labs in am   2. Hypotention:  Resolved. Has hx of HTN. BP trending down.  Hypotension was in the setting of hypovolemia with diarrhea and continuation of antihypertensives.  Monitor closely. Hold home antihypertensives. Will start low dose po hydralazine as needed if BP trending up.   3. COVID-19 pneumonia Not needing O2 supplementation but monitor closely. Will not start Dexamethasone at this time.  Renal function improved today, will start remdesivir.   Continue COVID-19 lab marker trend  4. Elevated troponin In setting of renal failure with no EKG changes, no chest pain.  Continue home ASA and Plavix  Troponin flattened   5. Diarrhea.  Likely due to COVID19 gastroenteritis. Continue supportive care. Negative stool for c diff.  Diarrhea is improving. May consider Imodium if his diarrhea is persistent.   6. Dm2.  Continue reduced  Lantus dose from his home regimen of  70u am to 35 un am due to worsening renal function. Continue SSI  7. Hx of PVCs and Vtach.  Stable HR so far. Resume BB now that his BP has improved.    8. Hx of grd 1 diastolic dysfunction.  Appears hypovolemic.  Continue IV fluids and monitor fluid status closely.    Code Status: Full DVT Prophylaxis: Lovenox, renally dosed Family Communication: Discussed with the patient  Disposition: Pending clinical improvement.   Consultants:  None Procedures:  None Antimicrobials:  None  Subjective: He feels better today. His diarrhea is improving. Denies nausea or vomiting. His appetite is still decreased.   Objective: Vitals:   02/07/19 1130 02/07/19 1145 02/07/19 1200 02/07/19 1400  BP:   (!) 158/84 (!) 150/87  Pulse: 82 72 64 69  Resp: (!) 22 (!) 22  (!) 25  Temp:      TempSrc:      SpO2: 98% 98% 97% 100%    Intake/Output Summary (Last 24 hours) at 02/07/2019 1533 Last data filed at 02/06/2019 1621 Gross per 24 hour  Intake 2000 ml  Output --  Net 2000 ml   There were no vitals filed for this visit.  Examination:  General exam: Appears calm and comfortable  Respiratory system:  Respiratory effort normal. No wheezing.  Cardiovascular system: S1 & S2 present, RRR. No pedal edema. Gastrointestinal system: Abdomen is nondistended, soft and nontender. Central nervous system: Alert and oriented. No focal neurological deficits. Extremities: Symmetric 5 x 5 power. Psychiatry:. Mood & affect appropriate.     Data Reviewed:  I have personally reviewed following labs and imaging studies  CBC: Recent Labs  Lab 02/02/19 1035 02/06/19 1418 02/07/19 0500  WBC 10.6* 5.8 5.7  NEUTROABS 7.2 3.2 3.2  HGB 12.8* 14.0 12.1*  HCT 38.8* 42.6 37.8*  MCV 89.6 89.5 92.0  PLT 216 218 [id number]   Basic Metabolic Panel: Recent Labs  Lab 02/02/19 1035 02/06/19 1418 02/07/19 0500  NA 140 139 141  K 3.1* 3.9 3.9  CL 106 105 110  CO2 25 20* 22  GLUCOSE 123* 182* 162*  BUN 18  51* 38*  CREATININE 1.91* 3.78* 2.12*  CALCIUM 8.9 8.8* 8.1*  MG 1.4*  --  1.9  PHOS  --   --  2.7   GFR: CrCl cannot be calculated (Unknown ideal weight.). Liver Function Tests: Recent Labs  Lab 02/02/19 1035 02/06/19 1418 02/07/19 0500  AST 62* 46* 40  ALT 54* 40 33  ALKPHOS 69 58 47  BILITOT 0.3 0.8 1.0  PROT 7.3 7.5 6.4*  ALBUMIN 3.3* 3.4* 2.9*   No results for input(s): LIPASE, AMYLASE in the last 168 hours. No results for input(s): AMMONIA in the last 168 hours. Coagulation Profile: No results for input(s): INR, PROTIME in the last 168 hours. Cardiac Enzymes: No results for input(s): CKTOTAL, CKMB, CKMBINDEX, TROPONINI in the last 168 hours. BNP (last 3 results) Recent Labs    03/29/18 1621  PROBNP 129   HbA1C: Recent Labs    02/07/19 0500  HGBA1C 9.2*   CBG: Recent Labs  Lab 02/07/19 0828 02/07/19 1214  GLUCAP 141* 153*   Lipid Profile: Recent Labs    02/06/19 1413  TRIG 210*   Thyroid Function Tests: No results for input(s): TSH, T4TOTAL, FREET4, T3FREE, THYROIDAB in the last 72 hours. Anemia Panel: Recent Labs    02/06/19 1418  FERRITIN 270   Sepsis Labs: Recent Labs  Lab 02/02/19 1035 02/02/19 1044 02/02/19 1223 02/06/19 1418 02/06/19 1728  PROCALCITON <0.10  --   --  0.27  --   LATICACIDVEN  --  1.5 1.6 2.4* 1.6    Recent Results (from the past 240 hour(s))  Blood Culture (routine x 2)     Status: None (Preliminary result)   Collection Time: 02/06/19  2:47 PM   Specimen: BLOOD  Result Value Ref Range Status   Specimen Description BLOOD SITE NOT SPECIFIED  Final   Special Requests   Final    BOTTLES DRAWN AEROBIC AND ANAEROBIC Blood Culture adequate volume   Culture   Final    NO GROWTH < 24 HOURS Performed at [facility], [place] [address], [place], [place] [address]    Report Status PENDING  Incomplete  Blood Culture (routine x 2)     Status: None (Preliminary result)   Collection Time: 02/06/19  5:28 PM    Specimen: BLOOD  Result Value Ref Range Status   Specimen Description BLOOD SITE NOT SPECIFIED  Final   Special Requests   Final    BOTTLES DRAWN AEROBIC AND ANAEROBIC Blood Culture adequate volume   Culture   Final    NO GROWTH < 12 HOURS Performed at [facility], [place] [address] [place], [place] [address]    Report Status PENDING  Incomplete  C difficile quick scan w PCR reflex     Status: None   Collection Time: 02/06/19 11:46 PM   Specimen: STOOL  Result Value Ref Range Status   C Diff antigen NEGATIVE NEGATIVE Final   C Diff  toxin NEGATIVE NEGATIVE Final   C Diff interpretation No C. difficile detected.  Final    Comment: Performed at [facility], [place] [address], [place], [place] [address]         Radiology Studies: DG Chest Port 1 View  Result Date: 02/06/2019 CLINICAL DATA:  Shortness of breath EXAM: PORTABLE CHEST 1 VIEW COMPARISON:  February 02, 2019 FINDINGS: There is slight left base atelectasis. Lungs elsewhere clear. Heart size and pulmonary vascularity are normal. No adenopathy. No bone lesions. IMPRESSION: Slight left base atelectasis. No edema or consolidation. Cardiac silhouette normal. No adenopathy. Electronically Signed   By: Lowella Grip III M.D.   On: 02/06/2019 15:31        Scheduled Meds: . aspirin EC  81 mg Oral Daily  . atorvastatin  80 mg Oral Daily  . clopidogrel  75 mg Oral Daily  . enoxaparin (LOVENOX) injection  30 mg Subcutaneous Q24H  . insulin aspart  0-9 Units Subcutaneous TID WC  . insulin glargine  35 Units Subcutaneous q morning - 10a   Continuous Infusions: . sodium chloride 125 mL/hr at 02/07/19 0423  . [START ON 02/08/2019] remdesivir 100 mg in NS 100 mL       LOS: 1 day    Time spent: 35 minutes   Blain Pais, MD [facility]   If 7PM-7AM, please contact night-coverage [email or web address] Password [facility] 02/07/2019, 3:33 PM

## 2019-02-07 NOTE — ED Notes (Signed)
Bfast ordered  

## 2019-02-08 LAB — CBC WITH DIFFERENTIAL/PLATELET
Abs Immature Granulocytes: 0.02 10*3/uL (ref 0.00–0.07)
Basophils Absolute: 0 10*3/uL (ref 0.0–0.1)
Basophils Relative: 1 %
Eosinophils Absolute: 0 10*3/uL (ref 0.0–0.5)
Eosinophils Relative: 0 %
HCT: 38.7 % — ABNORMAL LOW (ref 39.0–52.0)
Hemoglobin: 12.7 g/dL — ABNORMAL LOW (ref 13.0–17.0)
Immature Granulocytes: 0 %
Lymphocytes Relative: 33 %
Lymphs Abs: 1.7 10*3/uL (ref 0.7–4.0)
MCH: 29.3 pg (ref 26.0–34.0)
MCHC: 32.8 g/dL (ref 30.0–36.0)
MCV: 89.4 fL (ref 80.0–100.0)
Monocytes Absolute: 0.8 10*3/uL (ref 0.1–1.0)
Monocytes Relative: 16 %
Neutro Abs: 2.6 10*3/uL (ref 1.7–7.7)
Neutrophils Relative %: 50 %
Platelets: 209 10*3/uL (ref 150–400)
RBC: 4.33 MIL/uL (ref 4.22–5.81)
RDW: 12.6 % (ref 11.5–15.5)
WBC: 5.2 10*3/uL (ref 4.0–10.5)
nRBC: 0 % (ref 0.0–0.2)

## 2019-02-08 LAB — COMPREHENSIVE METABOLIC PANEL
ALT: 38 U/L (ref 0–44)
AST: 49 U/L — ABNORMAL HIGH (ref 15–41)
Albumin: 2.9 g/dL — ABNORMAL LOW (ref 3.5–5.0)
Alkaline Phosphatase: 49 U/L (ref 38–126)
Anion gap: 8 (ref 5–15)
BUN: 21 mg/dL (ref 8–23)
CO2: 21 mmol/L — ABNORMAL LOW (ref 22–32)
Calcium: 8.2 mg/dL — ABNORMAL LOW (ref 8.9–10.3)
Chloride: 111 mmol/L (ref 98–111)
Creatinine, Ser: 1.61 mg/dL — ABNORMAL HIGH (ref 0.61–1.24)
GFR calc Af Amer: 52 mL/min — ABNORMAL LOW (ref >60)
GFR calc non Af Amer: 45 mL/min — ABNORMAL LOW (ref >60)
Glucose, Bld: 87 mg/dL (ref 70–99)
Potassium: 3.4 mmol/L — ABNORMAL LOW (ref 3.5–5.1)
Sodium: 140 mmol/L (ref 135–145)
Total Bilirubin: 0.9 mg/dL (ref 0.3–1.2)
Total Protein: 6.6 g/dL (ref 6.5–8.1)

## 2019-02-08 LAB — GLUCOSE, CAPILLARY
Glucose-Capillary: 87 mg/dL (ref 70–99)
Glucose-Capillary: 92 mg/dL (ref 70–99)
Glucose-Capillary: 115 mg/dL — ABNORMAL HIGH (ref 70–99)

## 2019-02-08 LAB — MAGNESIUM: Magnesium: 1.7 mg/dL (ref 1.7–2.4)

## 2019-02-08 LAB — D-DIMER, QUANTITATIVE: D-Dimer, Quant: 0.36 ug/mL-FEU (ref 0.00–0.50)

## 2019-02-08 LAB — PHOSPHORUS: Phosphorus: 1.8 mg/dL — ABNORMAL LOW (ref 2.5–4.6)

## 2019-02-08 LAB — C-REACTIVE PROTEIN: CRP: 5 mg/dL — ABNORMAL HIGH (ref <1.0)

## 2019-02-08 MED ORDER — HYDRALAZINE HCL 25 MG PO TABS: 25.0000 mg | ORAL_TABLET | Freq: Three times a day (TID) | ORAL | Status: DC | PRN

## 2019-02-08 MED ORDER — POTASSIUM PHOSPHATES 15 MMOLE/5ML IV SOLN
20.0000 mmol | Freq: Once | INTRAVENOUS | Status: AC
  Administered 2019-02-08: 20 mmol via INTRAVENOUS
  Filled 2019-02-08: qty 6.67

## 2019-02-08 MED ORDER — ENOXAPARIN SODIUM 40 MG/0.4ML ~~LOC~~ SOLN: 40.0000 mg | SUBCUTANEOUS | Status: DC

## 2019-02-08 MED ORDER — ADULT MULTIVITAMIN W/MINERALS CH
1.0000 | ORAL_TABLET | Freq: Every day | ORAL | Status: DC
  Administered 2019-02-08 – 2019-02-09 (×2): 1 via ORAL
  Filled 2019-02-08 (×2): qty 1

## 2019-02-08 MED ORDER — ENSURE MAX PROTEIN PO LIQD
11.0000 [oz_av] | Freq: Every day | ORAL | Status: DC
  Administered 2019-02-08 – 2019-02-09 (×2): 11 [oz_av] via ORAL
  Filled 2019-02-08 (×2): qty 330

## 2019-02-08 NOTE — Progress Notes (Signed)
Initial Nutrition Assessment  DOCUMENTATION CODES:   Obesity unspecified  INTERVENTION:   -Ensure MAX Protein po BID, each supplement provides 150 kcal and 30 grams of protein -Multivitamin with minerals daily  NUTRITION DIAGNOSIS:   Increased nutrient needs related to acute illness(COVID-19 infection) as evidenced by estimated needs.  GOAL:   Patient will meet greater than or equal to 90% of their needs  MONITOR:   PO intake, Supplement acceptance, Labs, Weight trends, I & O's  REASON FOR ASSESSMENT:   Malnutrition Screening Tool    ASSESSMENT:   22 yr man with PMH of DM2 on insulin HTN, PVCs, Vtach s/p ablation yrs ago, CKD3 with bln Scr of 1.6-1.9, cardiomyopathy with EF of 99991111 and grd1 diastolic dysfunction, non obstructive CAD, presented to the ED vias EMS with weakness and diarrhea x3 days, found to have COVID,  **RD working remotely**  Patient has been COVID-19 positive since 1/6. Pt has had a poor appetite since developing symptoms such as cough and SOB. Pt has had diarrhea as well. No PO documented yet this admission. Will order Ensure Max supplements for additional protein.  Per weight records, pt has lost 8 lbs since November 2020 (4% wt loss x 2 months, insignificant for time frame).  I/Os: +3L since admit  Medications reviewed. Labs reviewed: CBGs: 87-108 Low K,Phos Mg WNL   NUTRITION - FOCUSED PHYSICAL EXAM:  Working remotely.  Diet Order:   Diet Order            Diet Carb Modified Fluid consistency: Thin; Room service appropriate? Yes  Diet effective now              EDUCATION NEEDS:   No education needs have been identified at this time  Skin:  Skin Assessment: Reviewed RN Assessment  Last BM:  1/11 -type 7  Height:   Ht Readings from Last 1 Encounters:  02/07/19 5\' 5"  (1.651 m)    Weight:   Wt Readings from Last 1 Encounters:  02/07/19 82.5 kg    Ideal Body Weight:  61.8 kg  BMI:  Body mass index is 30.27  kg/m.  Estimated Nutritional Needs:   Kcal:  1700-1900  Protein:  70-85g  Fluid:  1.9L/day  Clayton Bibles, MS, RD, LDN Inpatient Clinical Dietitian Pager: [phone] After Hours Pager: [phone]

## 2019-02-08 NOTE — Progress Notes (Signed)
PROGRESS NOTE    CINCH RIPPLE  [id number] DOB: 1955/02/05 DOA: 02/06/2019 PCP: Marrian Salvage, FNP    Brief Narrative:  32 yr man with PMH of DM2 on insulin HTN, PVCs, Vtach s/p ablation yrs ago, CKD3 with bln Scr of 1.6-1.9, cardiomyopathy with EF of 99991111 and grd1 diastolic dysfunction, non obstructive CAD, presented to the ED vias EMS with weakness and diarrhea x3 days, found to have COVID, hypotension with SBP in the low 80s and AKI.    Assessment & Plan:   Principal Problem:   Acute on chronic renal failure (HCC) Active Problems:   Diabetes type 2, uncontrolled (HCC)   HTN (hypertension), malignant   PVCs (premature ventricular contractions)   CAD (coronary artery disease)   COVID-19 virus infection   Diarrhea   1. Acute on chronic renal failure: Baseline Scr of 1.6-1.9. Likely multifactorial, due to prerenal azotemia but possible ATN due to hypotention. UA unremarkable.  -Improving with IV fluids which will be continued.  -Monitor UOP -Avoid nephrotoxins, hold ACE -Repeat labs in am -Scr close to baseline now. May dc IV fluids in am if Scr remains stable.    2. Hypotention:  Resolved. Has hx of HTN. BP trending up.  Initial hypotension was in the setting of hypovolemia with diarrhea and continuation of antihypertensives.  Monitor closely. Hold ACEi, HCTZ Start po hydralazine as needed    3. COVID-19 pneumonia Not needing O2 supplementation but monitor closely. Will not start Dexamethasone at this time.  Continue remdesivir.   Continue COVID-19 lab marker trend Patient feels weak, will order PT eval for tomorrow.   4. Elevated troponin In setting of renal failure with no EKG changes, no chest pain.  Continue home ASA and Plavix  Troponin flattened   5. Diarrhea.  Likely due to COVID19 gastroenteritis. Continue supportive care. Negative stool for c diff.  Diarrhea is improving, almost resolved.    6. Dm2.  Continue reduced  Lantus  dose from his home regimen of 70u am to 35 un am due to worsening renal function. Continue SSI  7. Hx of PVCs and Vtach.  Stable HR so far. Resume BB now that his BP has improved.    8. Hx of grd 1 diastolic dysfunction.  Appears hypovolemic.  Continue IV fluids and monitor fluid status closely.    Code Status: Full DVT Prophylaxis: Lovenox, renally dosed Family Communication: Discussed with the patient  Disposition: Pending clinical improvement.   Consultants:  None Procedures:  None Antimicrobials:  None  Subjective: He feels tired and weak. Respiratory status is improving.   Objective: Vitals:   02/07/19 2100 02/07/19 2304 02/08/19 0746 02/08/19 1650  BP:  (!) 151/91 (!) 151/96 (!) 151/93  Pulse:  86 79 84  Resp:  20 16 16   Temp:  98.5 F (36.9 C) 98.8 F (37.1 C) 99.3 F (37.4 C)  TempSrc:   Oral Oral  SpO2: 99% 98% 100% 99%  Weight: 82.5 kg     Height: 5\' 5"  (1.651 m)       Intake/Output Summary (Last 24 hours) at 02/08/2019 1717 Last data filed at 02/08/2019 [id number] Gross per 24 hour  Intake 1000 ml  Output --  Net 1000 ml   Filed Weights   02/07/19 2100  Weight: 82.5 kg    Examination:  General exam: Appears calm and comfortable  Respiratory system:  Respiratory effort normal. No wheezing.  Cardiovascular system: S1 & S2 present, RRR. No pedal edema. Gastrointestinal system: Abdomen is nondistended,  soft and nontender. Central nervous system: Alert and oriented. No focal neurological deficits. Extremities: Symmetric 5 x 5 power. Psychiatry:. Mood & affect appropriate.     Data Reviewed: I have personally reviewed following labs and imaging studies  CBC: Recent Labs  Lab 02/02/19 1035 02/06/19 1418 02/07/19 0500 02/08/19 0318  WBC 10.6* 5.8 5.7 5.2  NEUTROABS 7.2 3.2 3.2 2.6  HGB 12.8* 14.0 12.1* 12.7*  HCT 38.8* 42.6 37.8* 38.7*  MCV 89.6 89.5 92.0 89.4  PLT 216 218 194 [id number]   Basic Metabolic Panel: Recent Labs  Lab  02/02/19 1035 02/06/19 1418 02/07/19 0500 02/08/19 0318  NA 140 139 141 140  K 3.1* 3.9 3.9 3.4*  CL 106 105 110 111  CO2 25 20* 22 21*  GLUCOSE 123* 182* 162* 87  BUN 18 51* 38* 21  CREATININE 1.91* 3.78* 2.12* 1.61*  CALCIUM 8.9 8.8* 8.1* 8.2*  MG 1.4*  --  1.9 1.7  PHOS  --   --  2.7 1.8*   GFR: Estimated Creatinine Clearance: 45.8 mL/min (A) (by C-G formula based on SCr of 1.61 mg/dL (H)). Liver Function Tests: Recent Labs  Lab 02/02/19 1035 02/06/19 1418 02/07/19 0500 02/08/19 0318  AST 62* 46* 40 49*  ALT 54* 40 33 38  ALKPHOS 69 58 47 49  BILITOT 0.3 0.8 1.0 0.9  PROT 7.3 7.5 6.4* 6.6  ALBUMIN 3.3* 3.4* 2.9* 2.9*   No results for input(s): LIPASE, AMYLASE in the last 168 hours. No results for input(s): AMMONIA in the last 168 hours. Coagulation Profile: No results for input(s): INR, PROTIME in the last 168 hours. Cardiac Enzymes: No results for input(s): CKTOTAL, CKMB, CKMBINDEX, TROPONINI in the last 168 hours. BNP (last 3 results) Recent Labs    03/29/18 1621  PROBNP 129   HbA1C: Recent Labs    02/07/19 0500  HGBA1C 9.2*   CBG: Recent Labs  Lab 02/07/19 0828 02/07/19 1214 02/07/19 1727 02/08/19 0902 02/08/19 1312  GLUCAP 141* 153* 108* 87 92   Lipid Profile: Recent Labs    02/06/19 1413  TRIG 210*   Thyroid Function Tests: No results for input(s): TSH, T4TOTAL, FREET4, T3FREE, THYROIDAB in the last 72 hours. Anemia Panel: Recent Labs    02/06/19 1418  FERRITIN 270   Sepsis Labs: Recent Labs  Lab 02/02/19 1035 02/02/19 1044 02/02/19 1223 02/06/19 1418 02/06/19 1728  PROCALCITON <0.10  --   --  0.27  --   LATICACIDVEN  --  1.5 1.6 2.4* 1.6    Recent Results (from the past 240 hour(s))  Blood Culture (routine x 2)     Status: None (Preliminary result)   Collection Time: 02/06/19  2:47 PM   Specimen: BLOOD  Result Value Ref Range Status   Specimen Description BLOOD SITE NOT SPECIFIED  Final   Special Requests   Final     BOTTLES DRAWN AEROBIC AND ANAEROBIC Blood Culture adequate volume   Culture   Final    NO GROWTH 2 DAYS Performed at [facility], [place] [address] [place], [place] [address]    Report Status PENDING  Incomplete  Blood Culture (routine x 2)     Status: None (Preliminary result)   Collection Time: 02/06/19  5:28 PM   Specimen: BLOOD  Result Value Ref Range Status   Specimen Description BLOOD SITE NOT SPECIFIED  Final   Special Requests   Final    BOTTLES DRAWN AEROBIC AND ANAEROBIC Blood Culture adequate volume   Culture  Final    NO GROWTH 2 DAYS Performed at [facility], [place] [address] [place], [place] [address]    Report Status PENDING  Incomplete  C difficile quick scan w PCR reflex     Status: None   Collection Time: 02/06/19 11:46 PM   Specimen: STOOL  Result Value Ref Range Status   C Diff antigen NEGATIVE NEGATIVE Final   C Diff toxin NEGATIVE NEGATIVE Final   C Diff interpretation No C. difficile detected.  Final    Comment: Performed at [facility], [place] [address], [place], [place] [address]         Radiology Studies: No results found.      Scheduled Meds: . aspirin EC  81 mg Oral Daily  . atorvastatin  80 mg Oral Daily  . clopidogrel  75 mg Oral Daily  . enoxaparin (LOVENOX) injection  30 mg Subcutaneous Q24H  . insulin aspart  0-9 Units Subcutaneous TID WC  . insulin glargine  35 Units Subcutaneous q morning - 10a  . multivitamin with minerals  1 tablet Oral Daily  . Ensure Max Protein  11 oz Oral Daily   Continuous Infusions: . sodium chloride 125 mL/hr at 02/08/19 0619  . remdesivir 100 mg in NS 100 mL 100 mg (02/08/19 1019)     LOS: 2 days    Time spent: 35 minutes   Blain Pais, MD [facility]   If 7PM-7AM, please contact night-coverage [email or web address] Password [facility] 02/08/2019, 5:17 PM

## 2019-02-09 DIAGNOSIS — N183 Chronic kidney disease, stage 3 unspecified: Secondary | ICD-10-CM

## 2019-02-09 LAB — COMPREHENSIVE METABOLIC PANEL
ALT: 34 U/L (ref 0–44)
AST: 43 U/L — ABNORMAL HIGH (ref 15–41)
Albumin: 2.7 g/dL — ABNORMAL LOW (ref 3.5–5.0)
Alkaline Phosphatase: 44 U/L (ref 38–126)
Anion gap: 10 (ref 5–15)
BUN: 17 mg/dL (ref 8–23)
CO2: 19 mmol/L — ABNORMAL LOW (ref 22–32)
Calcium: 8.2 mg/dL — ABNORMAL LOW (ref 8.9–10.3)
Chloride: 109 mmol/L (ref 98–111)
Creatinine, Ser: 1.24 mg/dL (ref 0.61–1.24)
GFR calc Af Amer: >60 mL/min (ref >60)
GFR calc non Af Amer: >60 mL/min (ref >60)
Glucose, Bld: 165 mg/dL — ABNORMAL HIGH (ref 70–99)
Potassium: 3.8 mmol/L (ref 3.5–5.1)
Sodium: 138 mmol/L (ref 135–145)
Total Bilirubin: 0.7 mg/dL (ref 0.3–1.2)
Total Protein: 6.3 g/dL — ABNORMAL LOW (ref 6.5–8.1)

## 2019-02-09 LAB — CBC WITH DIFFERENTIAL/PLATELET
Abs Immature Granulocytes: 0.03 10*3/uL (ref 0.00–0.07)
Basophils Absolute: 0 10*3/uL (ref 0.0–0.1)
Basophils Relative: 0 %
Eosinophils Absolute: 0 10*3/uL (ref 0.0–0.5)
Eosinophils Relative: 0 %
HCT: 36.2 % — ABNORMAL LOW (ref 39.0–52.0)
Hemoglobin: 12.2 g/dL — ABNORMAL LOW (ref 13.0–17.0)
Immature Granulocytes: 1 %
Lymphocytes Relative: 36 %
Lymphs Abs: 1.7 10*3/uL (ref 0.7–4.0)
MCH: 29.5 pg (ref 26.0–34.0)
MCHC: 33.7 g/dL (ref 30.0–36.0)
MCV: 87.4 fL (ref 80.0–100.0)
Monocytes Absolute: 0.8 10*3/uL (ref 0.1–1.0)
Monocytes Relative: 17 %
Neutro Abs: 2.2 10*3/uL (ref 1.7–7.7)
Neutrophils Relative %: 46 %
Platelets: 213 10*3/uL (ref 150–400)
RBC: 4.14 MIL/uL — ABNORMAL LOW (ref 4.22–5.81)
RDW: 12.4 % (ref 11.5–15.5)
WBC: 4.8 10*3/uL (ref 4.0–10.5)
nRBC: 0 % (ref 0.0–0.2)

## 2019-02-09 LAB — GLUCOSE, CAPILLARY
Glucose-Capillary: 127 mg/dL — ABNORMAL HIGH (ref 70–99)
Glucose-Capillary: 135 mg/dL — ABNORMAL HIGH (ref 70–99)

## 2019-02-09 LAB — MAGNESIUM: Magnesium: 1.6 mg/dL — ABNORMAL LOW (ref 1.7–2.4)

## 2019-02-09 LAB — D-DIMER, QUANTITATIVE: D-Dimer, Quant: 0.37 ug/mL-FEU (ref 0.00–0.50)

## 2019-02-09 LAB — C-REACTIVE PROTEIN: CRP: 4.8 mg/dL — ABNORMAL HIGH (ref <1.0)

## 2019-02-09 LAB — PHOSPHORUS: Phosphorus: 2.4 mg/dL — ABNORMAL LOW (ref 2.5–4.6)

## 2019-02-09 MED ORDER — MAGNESIUM SULFATE 2 GM/50ML IV SOLN
2.0000 g | Freq: Once | INTRAVENOUS | Status: AC
  Administered 2019-02-09: 2 g via INTRAVENOUS
  Filled 2019-02-09: qty 50

## 2019-02-09 MED ORDER — ADULT MULTIVITAMIN W/MINERALS CH: 1.0000 | ORAL_TABLET | Freq: Every day | ORAL | Status: AC

## 2019-02-09 MED ORDER — ENSURE MAX PROTEIN PO LIQD: 11.0000 [oz_av] | Freq: Every day | ORAL | 12 refills | Status: DC

## 2019-02-09 MED ORDER — INSULIN GLARGINE 100 UNIT/ML SOLOSTAR PEN: 35.0000 [IU] | PEN_INJECTOR | SUBCUTANEOUS | Status: DC

## 2019-02-09 NOTE — Evaluation (Addendum)
Physical Therapy Evaluation Patient Details Name: [name] MRN: [id number] DOB: [birth year] Today's Date: 02/09/2019   History of Present Illness  43 yr man with PMH of DM2 on insulin HTN, PVCs, Vtach s/p ablation yrs ago, CKD3 with bln Scr of 1.6-1.9, cardiomyopathy with EF of 99991111 and grd1 diastolic dysfunction, non obstructive CAD, presented to the ED vias EMS with weakness and diarrhea x3 days, found to have COVID, hypotension with SBP in the low 80s and AKI.   Clinical Impression  Pt admitted with above diagnosis. Pt did well with ambulation without device with good stability overall. Sats >90% with activity as well on RA.  Pt should progress well and be able to go home without PT f/u. Discussed energy conservation briefly.   Pt currently with functional limitations due to the deficits listed below (see PT Problem List). Pt will benefit from skilled PT to increase their independence and safety with mobility to allow discharge to the venue listed below.      Follow Up Recommendations No PT follow up;Supervision - Intermittent    Equipment Recommendations  None recommended by PT    Recommendations for Other Services       Precautions / Restrictions Precautions Precautions: Fall Restrictions Weight Bearing Restrictions: No      Mobility  Bed Mobility Overal bed mobility: Independent                Transfers Overall transfer level: Independent                  Ambulation/Gait Ambulation/Gait assistance: Supervision Gait Distance (Feet): 110 Feet Assistive device: None Gait Pattern/deviations: Step-through pattern;Decreased stride length   Gait velocity interpretation: 1.31 - 2.62 ft/sec, indicative of limited community ambulator General Gait Details: Pt was able to ambulate without assist with good balance overall.   Stairs            Wheelchair Mobility    Modified Rankin (Stroke Patients Only)       Balance Overall balance  assessment: Needs assistance Sitting-balance support: No upper extremity supported;Feet supported Sitting balance-Leahy Scale: Good     Standing balance support: No upper extremity supported;During functional activity Standing balance-Leahy Scale: Good                               Pertinent Vitals/Pain Pain Assessment: No/denies pain    Home Living Family/patient expects to be discharged to:: Private residence Living Arrangements: Children Available Help at Discharge: Family;Available 24 hours/day(daughter) Type of Home: Apartment Home Access: Stairs to enter Entrance Stairs-Rails: Can reach both Entrance Stairs-Number of Steps: 6 Home Layout: Two level;1/2 bath on main level;Bed/bath upstairs Home Equipment: None      Prior Function Level of Independence: Independent         Comments: works in group home, 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, [employer] challenged adults      Hand Dominance   Dominant Hand: Right    Extremity/Trunk Assessment   Upper Extremity Assessment Upper Extremity Assessment: Defer to OT evaluation    Lower Extremity Assessment Lower Extremity Assessment: Overall WFL for tasks assessed    Cervical / Trunk Assessment Cervical / Trunk Assessment: Normal  Communication   Communication: No difficulties  Cognition Arousal/Alertness: Awake/alert Behavior During Therapy: WFL for tasks assessed/performed Overall Cognitive Status: Within Functional Limits for tasks assessed  General Comments      Exercises     Assessment/Plan    PT Assessment Patient needs continued PT services  PT Problem List Decreased activity tolerance;Decreased balance;Decreased mobility       PT Treatment Interventions DME instruction;Gait training;Functional mobility training;Therapeutic activities;Therapeutic exercise;Balance training;Patient/family education;Stair training    PT Goals (Current goals  can be found in the Care Plan section)  Acute Rehab PT Goals Patient Stated Goal: to gohome PT Goal Formulation: With patient Time For Goal Achievement: 02/23/19 Potential to Achieve Goals: Good    Frequency Min 3X/week   Barriers to discharge        Co-evaluation               AM-PAC PT "6 Clicks" Mobility  Outcome Measure Help needed turning from your back to your side while in a flat bed without using bedrails?: None Help needed moving from lying on your back to sitting on the side of a flat bed without using bedrails?: None Help needed moving to and from a bed to a chair (including a wheelchair)?: None Help needed standing up from a chair using your arms (e.g., wheelchair or bedside chair)?: None Help needed to walk in hospital room?: A Little Help needed climbing 3-5 steps with a railing? : A Little 6 Click Score: 22    End of Session Equipment Utilized During Treatment: Gait belt Activity Tolerance: Patient limited by fatigue Patient left: in chair;with call bell/phone within reach Nurse Communication: Mobility status PT Visit Diagnosis: Muscle weakness (generalized) (M62.81)    Time: [id number] PT Time Calculation (min) (ACUTE ONLY): 11 min   Charges:   PT Evaluation $PT Eval Low Complexity: 1 Low          Maral Lampe W,PT Acute Rehabilitation Services Pager:  [phone]  Office:  [phone]    Denice Paradise 02/09/2019, 1:57 PM

## 2019-02-09 NOTE — Discharge Summary (Signed)
Physician Discharge Summary  KATHARINE YANNUZZI [id number] DOB: 02/12/1955  PCP: Marrian Salvage, FNP  Admitted from: Home Discharged to: Home  Admit date: 02/06/2019 Discharge date: 02/09/2019  Recommendations for Outpatient Follow-up:   Follow-up Information    Marrian Salvage, [place]. Schedule an appointment as soon as possible for a visit in 1 week(s).   Specialty: Internal Medicine Why: To be seen with repeat labs (CBC, CMP, Mg & Phosphorus). Contact information: [place] [address] [phone]        Jerline Pain, MD .   Specialty: Cardiology Contact information: [phone] [address] [phone]            Home Health: None Equipment/Devices: None  Discharge Condition: Improved and stable. CODE STATUS: Full Diet recommendation: Heart healthy & diabetic diet.  Discharge Diagnoses:  Principal Problem:   Acute on chronic renal failure (HCC) Active Problems:   Diabetes type 2, uncontrolled (HCC)   HTN (hypertension), malignant   PVCs (premature ventricular contractions)   CAD (coronary artery disease)   COVID-19 virus infection   Diarrhea   Brief Summary: 64 year old male, lives with his daughter, independent, PMH of type II DM/IDDM, HTN, PVCs, V. tach s/p ablation years ago, CKD 3 with baseline creatinine reportedly in the 1.6-1.9 range, cardiomyopathy with EF of 50-55% and grade 1 diastolic dysfunction, nonobstructive CAD, presented to the ED via EMS due to weakness and diarrhea of 3 days duration.  Admitted for COVID-19 infection, hypotension with SBP in the 80s and acute kidney injury.  Assessment and plan:  1. Acute kidney injury complicating stage III b chronic kidney disease: Baseline serum creatinine reportedly in the 1.6-1.9 range.  Acute kidney injury likely multifactorial related to dehydration from poor oral intake, meds i.e. HCTZ and ACEI, possible ATN due to hypotension.  UA  unremarkable.  Above medications with held.  Hydrated with IV fluids.  Creatinine has normalized.  Continue to hold HCTZ and ACEI at discharge until close outpatient follow-up with repeat BMP in a week's time at which time PCP may consider resuming these medications if needed. 2. Hypotension: Resolved.  Blood pressure is now trending up.  Resume prior home dose of carvedilol and Imdur.  Continue to hold HCTZ and ACEI at discharge until outpatient follow-up with PCP. 3. COVID-19 pneumonia: Not hypoxic and hence steroids not initiated.  Completed 3 days of IV remdesivir in the hospital.  Arranged for outpatient remdesivir infusions, discussed with that team and with patient's daughter who was able to bring patient in for the infusions as outpatient. 4. Elevated troponin: In the setting of acute on chronic kidney disease.  No anginal symptoms or EKG changes.  Continue aspirin, Plavix, statins, nitrates and beta-blockers. 5. Diarrhea: Likely due to COVID-19 infection.  Improved.  Reportedly has some chronic diarrhea as well.  Negative stool C. difficile. 6. Type II DM/IDDM with renal complications: Patient was placed on half the dose of his home Lantus.  His CBGs with this are reasonably controlled as below.  Given that his appetite is still not back to his usual, continue reduced dose of Lantus until close outpatient follow-up with PCP.  A1c 9.2 suggests poor outpatient control and will need better outpatient management. 7. History of PVCs/V. tach: Telemetry shows sinus rhythm.  Resumed carvedilol at discharge. 8. Grade 1 diastolic dysfunction: Clinically euvolemic. 9. Hypokalemia: Replaced. 10. Hypomagnesemia: Replaced prior to discharge.  Outpatient follow-up.   Consultations:  None  Procedures:  None  Discharge Instructions  Discharge Instructions    Call MD for:  difficulty breathing, headache or visual disturbances   Complete by: As directed    Call MD for:  extreme fatigue   Complete  by: As directed    Call MD for:  persistant dizziness or light-headedness   Complete by: As directed    Call MD for:  persistant nausea and vomiting   Complete by: As directed    Call MD for:  severe uncontrolled pain   Complete by: As directed    Call MD for:  temperature >100.4   Complete by: As directed    Diet - low sodium heart healthy   Complete by: As directed    Diet Carb Modified   Complete by: As directed    Increase activity slowly   Complete by: As directed        Medication List    STOP taking these medications   hydrochlorothiazide 25 MG tablet Commonly known as: HYDRODIURIL   lisinopril 40 MG tablet Commonly known as: ZESTRIL     TAKE these medications   aspirin 81 MG EC tablet Take 1 tablet (81 mg total) by mouth daily.   atorvastatin 80 MG tablet Commonly known as: LIPITOR Take 1 tablet (80 mg total) by mouth daily.   carvedilol 25 MG tablet Commonly known as: COREG Take 1 tablet (25 mg total) by mouth 2 (two) times daily with a meal.   clopidogrel 75 MG tablet Commonly known as: PLAVIX Take 1 tablet (75 mg total) by mouth daily. **NEED TO ESTABLISH WITH NEW PROVIDER FOR FUTURE REFILLS*   Ensure Max Protein Liqd Take 330 mLs (11 oz total) by mouth daily. Start taking on: February 10, 2019   Insulin Glargine 100 UNIT/ML Solostar Pen Commonly known as: LANTUS Inject 35 Units into the skin every morning. And pen needles 2/day Start taking on: February 10, 2019 What changed: how much to take   isosorbide mononitrate 60 MG 24 hr tablet Commonly known as: IMDUR TAKE 1 & 1/2 (ONE & ONE-HALF) TABLETS BY MOUTH ONCE DAILY What changed: See the new instructions.   multivitamin with minerals Tabs tablet Take 1 tablet by mouth daily. Start taking on: February 10, 2019      Allergies  Allergen Reactions  . Ambien [Zolpidem Tartrate] Other (See Comments)    Pt states that this medication made him go crazy.   . Metformin And Related Diarrhea       Procedures/Studies: DG Chest Port 1 View  Result Date: 02/06/2019 CLINICAL DATA:  Shortness of breath EXAM: PORTABLE CHEST 1 VIEW COMPARISON:  February 02, 2019 FINDINGS: There is slight left base atelectasis. Lungs elsewhere clear. Heart size and pulmonary vascularity are normal. No adenopathy. No bone lesions. IMPRESSION: Slight left base atelectasis. No edema or consolidation. Cardiac silhouette normal. No adenopathy. Electronically Signed   By: Lowella Grip III M.D.   On: 02/06/2019 15:31   DG Chest Port 1 View  Result Date: 02/02/2019 CLINICAL DATA:  COVID positive.  Lethargic EXAM: PORTABLE CHEST 1 VIEW COMPARISON:  02/08/2017 FINDINGS: Streaky opacities noted in the lung bases bilaterally. Heart is normal size. No effusions or pneumothorax. No acute bony abnormality. IMPRESSION: Streaky bibasilar opacities could reflect atelectasis or infiltrates. Electronically Signed   By: Rolm Baptise M.D.   On: 02/02/2019 10:59      Subjective: Overall feels weak but otherwise no specific complaints.  Has ambulated in the room.  Denies dyspnea, chest pain or pain elsewhere.  Appetite still not back to normal.  No nausea or vomiting.  As per RN, no acute issues noted.  As per PT input, did well with ambulation without device and with good stability overall and oxygen saturations were >90% with activity as well on room air  Discharge Exam:  Vitals:   02/08/19 0746 02/08/19 1650 02/08/19 2249 02/09/19 0906  BP: (!) 151/96 (!) 151/93 (!) 166/86 (!) 170/95  Pulse: 79 84 79 76  Resp: 16 16 20 16   Temp: 98.8 F (37.1 C) 99.3 F (37.4 C) 99.4 F (37.4 C) 97.7 F (36.5 C)  TempSrc: Oral Oral    SpO2: 100% 99% 100% 100%  Weight:      Height:        General: Pleasant middle-age male, moderately built and overweight sitting up comfortably in chair without distress. Cardiovascular: S1 & S2 heard, RRR, S1/S2 +. No murmurs, rubs, gallops or clicks. No JVD or pedal edema.  Telemetry  personally reviewed: Sinus rhythm. Respiratory: Slightly diminished breath sounds in the bases but otherwise clear to auscultation. No increased work of breathing. Abdominal:  Non distended, non tender & soft. No organomegaly or masses appreciated. Normal bowel sounds heard. CNS: Alert and oriented. No focal deficits. Extremities: no edema, no cyanosis    The results of significant diagnostics from this hospitalization (including imaging, microbiology, ancillary and laboratory) are listed below for reference.     Microbiology: Recent Results (from the past 240 hour(s))  Blood Culture (routine x 2)     Status: None (Preliminary result)   Collection Time: 02/06/19  2:47 PM   Specimen: BLOOD  Result Value Ref Range Status   Specimen Description BLOOD SITE NOT SPECIFIED  Final   Special Requests   Final    BOTTLES DRAWN AEROBIC AND ANAEROBIC Blood Culture adequate volume   Culture   Final    NO GROWTH 3 DAYS Performed at [facility], [address], [place], [place] [address]    Report Status PENDING  Incomplete  Blood Culture (routine x 2)     Status: None (Preliminary result)   Collection Time: 02/06/19  5:28 PM   Specimen: BLOOD  Result Value Ref Range Status   Specimen Description BLOOD SITE NOT SPECIFIED  Final   Special Requests   Final    BOTTLES DRAWN AEROBIC AND ANAEROBIC Blood Culture adequate volume   Culture   Final    NO GROWTH 3 DAYS Performed at [facility], [address] [place], [place] [address]    Report Status PENDING  Incomplete  C difficile quick scan w PCR reflex     Status: None   Collection Time: 02/06/19 11:46 PM   Specimen: STOOL  Result Value Ref Range Status   C Diff antigen NEGATIVE NEGATIVE Final   C Diff toxin NEGATIVE NEGATIVE Final   C Diff interpretation No C. difficile detected.  Final    Comment: Performed at [facility], [place] [address] [place], [place] [address]     Labs: CBC: Recent Labs  Lab 02/06/19 1418  02/07/19 0500 02/08/19 0318 02/09/19 0439  WBC 5.8 5.7 5.2 4.8  NEUTROABS 3.2 3.2 2.6 2.2  HGB 14.0 12.1* 12.7* 12.2*  HCT 42.6 37.8* 38.7* 36.2*  MCV 89.5 92.0 89.4 87.4  PLT 218 194 209 [id number]    Basic Metabolic Panel: Recent Labs  Lab 02/06/19 1418 02/07/19 0500 02/08/19 0318 02/09/19 0439  NA 139 141 140 138  K 3.9 3.9 3.4*  3.8  CL 105 110 111 109  CO2 20* 22 21* 19*  GLUCOSE 182* 162* 87 165*  BUN 51* 38* 21 17  CREATININE 3.78* 2.12* 1.61* 1.24  CALCIUM 8.8* 8.1* 8.2* 8.2*  MG  --  1.9 1.7 1.6*  PHOS  --  2.7 1.8* 2.4*    Liver Function Tests: Recent Labs  Lab 02/06/19 1418 02/07/19 0500 02/08/19 0318 02/09/19 0439  AST 46* 40 49* 43*  ALT 40 33 38 34  ALKPHOS 58 47 49 44  BILITOT 0.8 1.0 0.9 0.7  PROT 7.5 6.4* 6.6 6.3*  ALBUMIN 3.4* 2.9* 2.9* 2.7*    CBG: Recent Labs  Lab 02/08/19 0902 02/08/19 1312 02/08/19 1739 02/09/19 0904 02/09/19 1307  GLUCAP 87 92 115* 127* 135*    Hgb A1c Recent Labs    02/07/19 0500  HGBA1C 9.2*     Urinalysis    Component Value Date/Time   COLORURINE YELLOW 02/06/2019 1848   APPEARANCEUR CLEAR 02/06/2019 1848   LABSPEC 1.014 02/06/2019 1848   PHURINE 5.0 02/06/2019 1848   GLUCOSEU NEGATIVE 02/06/2019 1848   HGBUR NEGATIVE 02/06/2019 1848   BILIRUBINUR NEGATIVE 02/06/2019 1848   KETONESUR NEGATIVE 02/06/2019 1848   PROTEINUR NEGATIVE 02/06/2019 1848   UROBILINOGEN 0.2 02/12/2011 0804   NITRITE NEGATIVE 02/06/2019 1848   LEUKOCYTESUR NEGATIVE 02/06/2019 1848    I discussed in detail with patient's daughter, updated care and answered questions.  Advised her regarding the outpatient IV infusion plans and she will be able to transport him.  Also updated her regarding quarantine.  Time coordinating discharge: 45 minutes  SIGNED:  Vernell Leep, MD, [place], [facility]. [facility]  To contact the attending provider between 7A-7P or the covering provider during after hours 7P-7A, please log into the web  site [email or web address] and access using universal [place] password for that web site. If you do not have the password, please call the hospital operator.

## 2019-02-09 NOTE — Progress Notes (Signed)
Patient scheduled for outpatient Remdesivir infusion at 230PM on Thursday 1/14, and Friday 1/15.  Please advise them to report to [facility] at [address].  Drive to the security guard and tell them you are here for an infusion. They will direct you to the front entrance where we will come and get you.  For questions call [phone].  Thanks

## 2019-02-09 NOTE — Discharge Instructions (Addendum)
You are scheduled for an outpatient infusion of Remdesivir at 230PM on Thursday 1/14, and Friday 1/15.[name]  Please report to [facility] at [address]  Drive to the security guard and tell them you are here for an infusion. They will direct you to the front entrance where we will come and get you.  For questions call [phone].  Thanks       Please get your medications reviewed and adjusted by your Primary MD.  Please request your Primary MD to go over all Hospital Tests and Procedure/Radiological results at the follow up, please get all Hospital records sent to your Prim MD by signing hospital release before you go home.  If you had Pneumonia of Lung problems at the Hospital: Please get a 2 view Chest X ray done in 6-8 weeks after hospital discharge or sooner if instructed by your Primary MD.  If you have Congestive Heart Failure: Please call your Cardiologist or Primary MD anytime you have any of the following symptoms:  1) 3 pound weight gain in 24 hours or 5 pounds in 1 week  2) shortness of breath, with or without a dry hacking cough  3) swelling in the hands, feet or stomach  4) if you have to sleep on extra pillows at night in order to breathe  Follow cardiac low salt diet and 1.5 lit/day fluid restriction.  If you have diabetes Accuchecks 4 times/day, Once in AM empty stomach and then before each meal. Log in all results and show them to your primary doctor at your next visit. If any glucose reading is under 80 or above 300 call your primary MD immediately.  If you have Seizure/Convulsions/Epilepsy: Please do not drive, operate heavy machinery, participate in activities at heights or participate in high speed sports until you have seen by Primary MD or a Neurologist and advised to do so again.  If you had Gastrointestinal Bleeding: Please ask your Primary MD to check a complete blood count within one week of discharge or at your next visit. Your  endoscopic/colonoscopic biopsies that are pending at the time of discharge, will also need to followed by your Primary MD.  Get Medicines reviewed and adjusted. Please take all your medications with you for your next visit with your Primary MD  Please request your Primary MD to go over all hospital tests and procedure/radiological results at the follow up, please ask your Primary MD to get all Hospital records sent to his/her office.  If you experience worsening of your admission symptoms, develop shortness of breath, life threatening emergency, suicidal or homicidal thoughts you must seek medical attention immediately by calling 911 or calling your MD immediately  if symptoms less severe.  You must read complete instructions/literature along with all the possible adverse reactions/side effects for all the Medicines you take and that have been prescribed to you. Take any new Medicines after you have completely understood and accpet all the possible adverse reactions/side effects.   Do not drive or operate heavy machinery when taking Pain medications.   Do not take more than prescribed Pain, Sleep and Anxiety Medications  Special Instructions: If you have smoked or chewed Tobacco  in the last 2 yrs please stop smoking, stop any regular Alcohol  and or any Recreational drug use.  Wear Seat belts while driving.  Please note You were cared for by a hospitalist during your hospital stay. If you have any questions about your discharge medications or the care you  received while you were in the hospital after you are discharged, you can call the unit and asked to speak with the hospitalist on call if the hospitalist that took care of you is not available. Once you are discharged, your primary care physician will handle any further medical issues. Please note that NO REFILLS for any discharge medications will be authorized once you are discharged, as it is imperative that you return to your primary care  physician (or establish a relationship with a primary care physician if you do not have one) for your aftercare needs so that they can reassess your need for medications and monitor your lab values.  You can reach the hospitalist office at phone [phone] or fax [phone]   If you do not have a primary care physician, you can call [phone] for a physician referral.

## 2019-02-10 ENCOUNTER — Ambulatory Visit (HOSPITAL_COMMUNITY)
Admission: RE | Admit: 2019-02-10 | Discharge: 2019-02-10 | Disposition: A | Discharge status: Home / Self Care | Payer: BC Managed Care – PPO | Source: Ambulatory Visit | Attending: Pulmonary Disease | Admitting: Pulmonary Disease

## 2019-02-10 VITALS — BP 139/79 | HR 62 | Temp 98.5°F | Resp 16

## 2019-02-10 DIAGNOSIS — U071 COVID-19: Secondary | ICD-10-CM

## 2019-02-10 MED ORDER — METHYLPREDNISOLONE SODIUM SUCC 125 MG IJ SOLR: 125.0000 mg | Freq: Once | INTRAMUSCULAR | Status: DC | PRN

## 2019-02-10 MED ORDER — SODIUM CHLORIDE 0.9 % IV SOLN
INTRAVENOUS | Status: AC
  Filled 2019-02-10: qty 20

## 2019-02-10 MED ORDER — DIPHENHYDRAMINE HCL 50 MG/ML IJ SOLN: 50.0000 mg | Freq: Once | INTRAMUSCULAR | Status: DC | PRN

## 2019-02-10 MED ORDER — SODIUM CHLORIDE 0.9 % IV SOLN
100.0000 mg | Freq: Once | INTRAVENOUS | Status: AC
  Administered 2019-02-10: 100 mg via INTRAVENOUS

## 2019-02-10 MED ORDER — ALBUTEROL SULFATE HFA 108 (90 BASE) MCG/ACT IN AERS: 2.0000 | INHALATION_SPRAY | Freq: Once | RESPIRATORY_TRACT | Status: DC | PRN

## 2019-02-10 MED ORDER — FAMOTIDINE IN NACL 20-0.9 MG/50ML-% IV SOLN: 20.0000 mg | Freq: Once | INTRAVENOUS | Status: DC | PRN

## 2019-02-10 MED ORDER — SODIUM CHLORIDE 0.9 % IV SOLN: INTRAVENOUS | Status: DC | PRN

## 2019-02-10 MED ORDER — EPINEPHRINE 0.3 MG/0.3ML IJ SOAJ: 0.3000 mg | Freq: Once | INTRAMUSCULAR | Status: DC | PRN

## 2019-02-10 NOTE — Progress Notes (Signed)
  Diagnosis: COVID-19  Physician: Marrian Salvage, FNP  Procedure: [facility] Infusion Clinic Med: remdesivir infusion.  Complications: No immediate complications noted.  Discharge: Discharged home   [name] 02/10/2019

## 2019-02-10 NOTE — Discharge Instructions (Signed)
10 Things You Can Do to Manage Your COVID-19 Symptoms at Home If you have possible or confirmed COVID-19: 1. Stay home from work and school. And stay away from other public places. If you must go out, avoid using any kind of public transportation, ridesharing, or taxis. 2. Monitor your symptoms carefully. If your symptoms get worse, call your healthcare provider immediately. 3. Get rest and stay hydrated. 4. If you have a medical appointment, call the healthcare provider ahead of time and tell them that you have or may have COVID-19. 5. For medical emergencies, call 911 and notify the dispatch personnel that you have or may have COVID-19. 6. Cover your cough and sneezes with a tissue or use the inside of your elbow. 7. Wash your hands often with soap and water for at least 20 seconds or clean your hands with an alcohol-based hand sanitizer that contains at least 60% alcohol. 8. As much as possible, stay in a specific room and away from other people in your home. Also, you should use a separate bathroom, if available. If you need to be around other people in or outside of the home, wear a mask. 9. Avoid sharing personal items with other people in your household, like dishes, towels, and bedding. 10. Clean all surfaces that are touched often, like counters, tabletops, and doorknobs. Use household cleaning sprays or wipes according to the label instructions. cdc.gov/coronavirus 07/28/2018 This information is not intended to replace advice given to you by your health care provider. Make sure you discuss any questions you have with your health care provider. Document Revised: 12/30/2018 Document Reviewed: 12/30/2018 Elsevier Patient Education  2020 Elsevier Inc.  

## 2019-02-11 ENCOUNTER — Ambulatory Visit (HOSPITAL_COMMUNITY)
Admit: 2019-02-11 | Discharge: 2019-02-11 | Disposition: A | Discharge status: Home / Self Care | Payer: BC Managed Care – PPO | Attending: Pulmonary Disease | Admitting: Pulmonary Disease

## 2019-02-11 DIAGNOSIS — U071 COVID-19: Secondary | ICD-10-CM

## 2019-02-11 MED ORDER — EPINEPHRINE 0.3 MG/0.3ML IJ SOAJ: 0.3000 mg | Freq: Once | INTRAMUSCULAR | Status: DC | PRN

## 2019-02-11 MED ORDER — SODIUM CHLORIDE 0.9 % IV SOLN
100.0000 mg | Freq: Once | INTRAVENOUS | Status: AC
  Administered 2019-02-11: 15:00:00 100 mg via INTRAVENOUS

## 2019-02-11 MED ORDER — FAMOTIDINE IN NACL 20-0.9 MG/50ML-% IV SOLN: 20.0000 mg | Freq: Once | INTRAVENOUS | Status: DC | PRN

## 2019-02-11 MED ORDER — METHYLPREDNISOLONE SODIUM SUCC 125 MG IJ SOLR: 125.0000 mg | Freq: Once | INTRAMUSCULAR | Status: DC | PRN

## 2019-02-11 MED ORDER — DIPHENHYDRAMINE HCL 50 MG/ML IJ SOLN: 50.0000 mg | Freq: Once | INTRAMUSCULAR | Status: DC | PRN

## 2019-02-11 MED ORDER — SODIUM CHLORIDE 0.9 % IV SOLN: INTRAVENOUS | Status: DC | PRN

## 2019-02-11 MED ORDER — ALBUTEROL SULFATE HFA 108 (90 BASE) MCG/ACT IN AERS: 2.0000 | INHALATION_SPRAY | Freq: Once | RESPIRATORY_TRACT | Status: DC | PRN

## 2019-02-11 MED ORDER — SODIUM CHLORIDE 0.9 % IV SOLN
INTRAVENOUS | Status: AC
  Filled 2019-02-11: qty 20

## 2019-02-11 NOTE — Discharge Instructions (Signed)
10 Things You Can Do to Manage Your COVID-19 Symptoms at Home If you have possible or confirmed COVID-19: 1. Stay home from work and school. And stay away from other public places. If you must go out, avoid using any kind of public transportation, ridesharing, or taxis. 2. Monitor your symptoms carefully. If your symptoms get worse, call your healthcare provider immediately. 3. Get rest and stay hydrated. 4. If you have a medical appointment, call the healthcare provider ahead of time and tell them that you have or may have COVID-19. 5. For medical emergencies, call 911 and notify the dispatch personnel that you have or may have COVID-19. 6. Cover your cough and sneezes with a tissue or use the inside of your elbow. 7. Wash your hands often with soap and water for at least 20 seconds or clean your hands with an alcohol-based hand sanitizer that contains at least 60% alcohol. 8. As much as possible, stay in a specific room and away from other people in your home. Also, you should use a separate bathroom, if available. If you need to be around other people in or outside of the home, wear a mask. 9. Avoid sharing personal items with other people in your household, like dishes, towels, and bedding. 10. Clean all surfaces that are touched often, like counters, tabletops, and doorknobs. Use household cleaning sprays or wipes according to the label instructions. cdc.gov/coronavirus 07/28/2018 This information is not intended to replace advice given to you by your health care provider. Make sure you discuss any questions you have with your health care provider. Document Revised: 12/30/2018 Document Reviewed: 12/30/2018 Elsevier Patient Education  2020 Elsevier Inc.  

## 2019-02-11 NOTE — Progress Notes (Signed)
  Diagnosis: COVID-19  Physician: Joya Gaskins  Procedure: Covid Infusion Clinic Med: remdesivir infusion.  Complications: No immediate complications noted.  Discharge: Discharged home   [place], [place] C 02/11/2019

## 2019-02-13 LAB — CULTURE, BLOOD (ROUTINE X 2)
Culture: NO GROWTH
Culture: NO GROWTH
Special Requests: ADEQUATE
Special Requests: ADEQUATE

## 2019-02-15 ENCOUNTER — Telehealth: Payer: Self-pay | Admitting: *Deleted

## 2019-02-15 NOTE — Telephone Encounter (Signed)
Noted.   [name] 

## 2019-02-15 NOTE — Telephone Encounter (Signed)
S/w pt due to having appt tomorrow.  Pt had COVID on Jan 10 or the 11 with 5 infusions.  Pt called this office to schedule appt due to needing a work note for return.  Stated this is not the office that would give pt a work note due to [place].  Per pt's D/C summary pt is to f/u with PCP in one week and get repeat lab work. Gave pt number for PCP. Pt was confused due to lots of paperwork and did not know who to call. Pt's d/c summary was dicussed and stated it's to soon to return to work per protocol but PCP will advise. Put pt's appt back to previous appt time and date for a 3 month f/u on 2/1 with Truitt Merle, NP.   Pt was very thankful this office called to advise. Will send to [place] to [place].

## 2019-02-15 NOTE — Progress Notes (Deleted)
CARDIOLOGY OFFICE NOTE  Date:  02/15/2019    [name] Date of Birth: 04-Jan-1956 Medical Record [id number]  PCP:  Marrian Salvage, Evangeline  Cardiologist:  Servando Snare & ***    No chief complaint on file.   History of Present Illness: [name] is a 64 y.o. male who presents today for a *** Seen for Dr. Marlou Porch.   He has a history of nonobstructive CAD with prior NSTEMI 2017 (no intervention needed - nonobstructive disease noted),HTN, PVC s/p RVOT ablationin 2014,NICM withchronic systolic HF, CKD stage III, prostate cancer treated with robotic prostatectomy in 2016 andpoorly controlledDM II. EF has been as low as 20 to 25 in 2014 - now up to 55 to 60% per echo in 2017 with grade I DD and with severe LVH.   He has had issues with ongoing dyspnea. Has had elevation with his BP.Wasseen by [name], NP in early March of 2020 - again for increasing DOE. BP was again elevated.Coreg was increased at that time. I ended up seeing him back in March - increased his Imdur for his DOE. When seen in September he endorsed more fatigue and heart racing which was new for him - I had recommended stress testing a monitor - not obtained until later last year and reassuring.   Last seen by me in October.   The patient {does/does not:200015} have symptoms concerning for COVID-19 infection (fever, chills, cough, or new shortness of breath).   Comes in today. Here with   Past Medical History:  Diagnosis Date  . Asthma   . CAD (coronary artery disease)    a. non obst dz by cath 2004  b. 11/12/15 which showed no obvious large vessel CAD. No PCI or clear reason for NSTEMI ( possibly hypertensive urgency?).   [name] CARDIOMYOPATHY    a. NICM, LVEF 30% 01/2011 echo; 20-25% 07/2012 echo, EF recovered by echo 06/2015.  . Congestive heart failure (CHF) ([place])   . DIABETES MELLITUS, TYPE II   . HYPERCHOLESTEROLEMIA   . HYPERTENSION   . Myocardial infarct ([place]) 10/2015  .  Noncompliance   . Prostate cancer [facility])    prostate cancer  . PVCs (premature ventricular contractions)    a. s/p ablation 01-07-2013 by Dr Rayann Heman    Past Surgical History:  Procedure Laterality Date  . ABLATION  01-07-2013   RVOT PVC's ablated by Dr Rayann Heman along anteroseptal RVOT  . CARDIAC CATHETERIZATION  2004   nonobst dz  . CARDIAC CATHETERIZATION N/A 11/12/2015   Procedure: Left Heart Cath and Coronary Angiography;  Surgeon: Sherren Mocha, MD;  Location: [place] CV LAB;  Service: Cardiovascular;  Laterality: N/A;  . CHOLECYSTECTOMY N/A 11/16/2013   Procedure: LAPAROSCOPIC CHOLECYSTECTOMY WITH INTRAOPERATIVE CHOLANGIOGRAM;  Surgeon: [name], MD;  Location: [facility] OR[facility];  Service: General;  Laterality: N/A;  . LYMPHADENECTOMY Bilateral 02/22/2014   Procedure: PELVIC LYMPH NODE DISSECTION;  Surgeon: [name], MD;  Location: [facility] ORS;  Service: Urology;  Laterality: Bilateral;  . PROSTATE SURGERY    . ROBOT ASSISTED LAPAROSCOPIC RADICAL PROSTATECTOMY N/A 02/22/2014   Procedure: ROBOTIC ASSISTED LAPAROSCOPIC RADICAL PROSTATECTOMY WITH INDOCYANINE GREEN DYE AND OPEN UMBILICAL HERNIA REPAIR;  Surgeon: [name], MD;  Location: [facility];  Service: Urology;  Laterality: N/A;  . SUPRAVENTRICULAR TACHYCARDIA ABLATION N/A 09/28/2012   Procedure: Tanna Furry Ablation;  Surgeon: [name], MD;  Location: [facility] CATH LAB;  Service: Cardiovascular;  Laterality: N/A;  . SURGERY SCROTAL / TESTICULAR     at  age 32  . V-TACH ABLATION N/A 01/07/2013   Procedure: V-TACH ABLATION;  Surgeon: [name], MD;  Location: [place] CATH LAB;  Service: Cardiovascular;  Laterality: N/A;     Medications: No outpatient medications have been marked as taking for the 02/16/19 encounter (Appointment) with [name], NP.     Allergies: Allergies  Allergen Reactions  . Ambien [Zolpidem Tartrate] Other (See Comments)    Pt states that this medication made him go crazy.   . Metformin And Related  Diarrhea    Social History: The patient  reports that he has never smoked. He has never used smokeless tobacco. He reports current alcohol use. He reports that he does not use drugs.   Family History: The patient's ***family history includes Colon cancer in his sister; Diabetes in his mother and son; Heart attack in his paternal grandfather; Heart failure in his maternal grandmother; Hypertension in his mother and another family member; Lung cancer in his brother; Pancreatic cancer in his paternal aunt; Peripheral vascular disease in his mother and another family member; Prostate cancer in his brother, brother, brother, brother and another family member.   Review of Systems: Please see the history of present illness.   All other systems are reviewed and negative.   Physical Exam: VS:  There were no vitals taken for this visit. [name]  BMI There is no height or weight on file to calculate BMI.  Wt Readings from Last 3 Encounters:  02/07/19 181 lb 14.1 oz (82.5 kg)  12/02/18 189 lb (85.7 kg)  11/22/18 189 lb 6.4 oz (85.9 kg)    General: Pleasant. Well developed, well nourished and in no acute distress.   HEENT: Normal.  Neck: Supple, no JVD, carotid bruits, or masses noted.  Cardiac: ***Regular rate and rhythm. No murmurs, rubs, or gallops. No edema.  Respiratory:  Lungs are clear to auscultation bilaterally with normal work of breathing.  GI: Soft and nontender.  MS: No deformity or atrophy. Gait and ROM intact.  Skin: Warm and dry. Color is normal.  Neuro:  Strength and sensation are intact and no gross focal deficits noted.  Psych: Alert, appropriate and with normal affect.   LABORATORY DATA:  EKG:  EKG {ACTION; IS/IS [id number] ordered today. This demonstrates ***.  Lab Results  Component Value Date   WBC 4.8 02/09/2019   HGB 12.2 (L) 02/09/2019   HCT 36.2 (L) 02/09/2019   PLT 213 02/09/2019   GLUCOSE 165 (H) 02/09/2019   CHOL 86 (L) 10/11/2018   TRIG 210 (H) 02/06/2019     HDL 30 (L) 10/11/2018   LDLDIRECT 148.8 04/25/2009   LDLCALC 39 10/11/2018   ALT 34 02/09/2019   AST 43 (H) 02/09/2019   NA 138 02/09/2019   K 3.8 02/09/2019   CL 109 02/09/2019   CREATININE 1.24 02/09/2019   BUN 17 02/09/2019   CO2 19 (L) 02/09/2019   TSH 1.140 10/11/2018   PSA 0.04 (L) 08/23/2018   INR 0.95 01/25/2016   HGBA1C 9.2 (H) 02/07/2019   MICROALBUR 0.9 10/15/2016     BNP (last 3 results) No results for input(s): BNP in the last 8760 hours.  ProBNP (last 3 results) Recent Labs    03/29/18 1621  PROBNP 129     Other Studies Reviewed Today: ECHO IMPRESSIONS 11/2018    1. Left ventricular ejection fraction, by visual estimation, is 50 to 55%. The left ventricle has normal function. There is severely increased left ventricular hypertrophy.  2. Left ventricular  diastolic parameters are consistent with Grade I diastolic dysfunction (impaired relaxation).  3. Global right ventricle has normal systolic function.The right ventricular size is normal.  4. Left atrial size was normal.  5. Right atrial size was normal.  6. The mitral valve is normal in structure. Trace mitral valve regurgitation. No evidence of mitral stenosis.  7. The tricuspid valve is normal in structure. Tricuspid valve regurgitation is trivial.  8. The aortic valve is tricuspid. Aortic valve regurgitation is not visualized. No evidence of aortic valve sclerosis or stenosis.  9. The pulmonic valve was normal in structure. Pulmonic valve regurgitation is not visualized. 10. Low normal to mildly reduced LV systolic function (EF 50); severe LVH; grade 1 diastolic dysfunction; trace MR and TR.   Myoview Study Highlights 11/2018   Nuclear stress EF: 42%. The left ventricular ejection fraction is moderately decreased (30-44%).  There was no ST segment deviation noted during stress.  This is an intermediate risk study. There is no evidence of ischemia or previous infarct.  Thre is moderate LV  dilitation with moderate LV dysfunction .     Monitor Study Highlights [phone]    Sinus rhythm with average heart rate of 75 BPM  Occasional premature ventricular contractions and premature atrial contractions (PVC, PAC)  No atrial fibrillation. No adverse arrhtyhmia.   Palpitations are likely a result of PVC, PAC, benign. Candee Furbish, MD      Left Heart Cath and Coronary AngiographyOctober 2017  Conclusion     Mid RCA lesion, 30 %stenosed.  Mid LAD lesion, 25 %stenosed.  1st Diag lesion, 30 %stenosed.  There is hyperdynamic left ventricular systolic function.  The left ventricular ejection fraction is greater than 65% by visual estimate.  1. Mild nonobstructive CAD as outlined, with mild plaque in the mid-RCA, mid-LAD, and first diagonal branch 2. Vigorous LV systolic function with normal LVEDP  No clear explanation for NSTEMI. Hypertensive emergency is a possibility. There is a tiny branch collateral possibly supplying the distal circumflex distribution from the RCA - it is possible this caused NSTEMI but the vessel is very small and occlusion is not visualized.     Echo: 07/19/2015 LV EF: 55% - 60% Study Conclusion - Left ventricle: The cavity size was mildly dilated. There was severe concentric hypertrophy. Systolic function was normal. The estimated ejection fraction was in the range of 55% to 60%. Wall motion was normal; there were no regional wall motion abnormalities. Doppler parameters are consistent with abnormal left ventricular relaxation (grade 1 diastolic dysfunction). - Left atrium: The atrium was mildly dilated    ASSESSMENT & PLAN:   1.Prior issues ofSOB-with over exertion -may be hisanginal equivalent but thishas also been a chronic issue - probably multifactorial. Still with fatigue - not clear etiology - he is ready to proceed with stress testing now.   2. HTN - BP is good today - no changes made today.     3. CAD - per cath in 2017 - was non obstructive - medically managed and needs aggressive CV risk factor modification.   2. HTN- known LVH -BP now is fine - just took all his medicines 2 hours prior to this visit - he is not symptomatic - would follow for now.  3. Nonobstructive CAD on cath in 2017-managed medically with CV risk factor modification.No overt chest pain butsays his fatigue persists. He does remain on Plavix and we can stop this if his study is ok. I still suspect some of his symptoms are more  life style related.  4. DM type 2 with associated CKD - needs to get back to see Dr. Loanne Drilling.   5. Known abnormal EKG secondary to severe concentric hypertrophy.  6. Palpitations - history of PVCs/Vtach with prior ablation - he has just finished his monitor - will let him know those results. He remains on beta blocker therapy.   7. Prostate Ca - not discussed  8. CKD - little worse - recheck lab today.   9. Health maintenance - flu shot today.    [name] COVID-19 Education: The signs and symptoms of COVID-19 were discussed with the patient and how to seek care for testing (follow up with PCP or arrange E-visit).  The importance of social distancing, staying at home, hand hygiene and wearing a mask when out in public were discussed today.  Current medicines are reviewed with the patient today.  The patient does not have concerns regarding medicines other than what has been noted above.  The following changes have been made:  See above.  Labs/ tests ordered today include:   No orders of the defined types were placed in this encounter.    Disposition:   FU with *** in {gen number [id number] {Days to years:10300}.   Patient is agreeable to this plan and will call if any problems develop in the interim.   SignedTruitt Merle, NP  02/15/2019 12:40 PM  [facility] [address] [place], [place]  [address] Phone: [phone] Fax: [phone]

## 2019-02-16 ENCOUNTER — Ambulatory Visit: Payer: BC Managed Care – PPO | Admitting: Nurse Practitioner

## 2019-02-16 ENCOUNTER — Telehealth: Payer: Self-pay | Admitting: Family

## 2019-02-16 NOTE — Telephone Encounter (Signed)
Please call him and let him know he can see if he can be seen at the respiratory clinic- they can check his labs and give him a return to work note.

## 2019-02-17 ENCOUNTER — Other Ambulatory Visit: Payer: Self-pay

## 2019-02-17 ENCOUNTER — Encounter (HOSPITAL_COMMUNITY): Payer: Self-pay

## 2019-02-17 ENCOUNTER — Ambulatory Visit (HOSPITAL_COMMUNITY)
Admission: EM | Admit: 2019-02-17 | Discharge: 2019-02-17 | Disposition: A | Discharge status: Home / Self Care | Payer: BC Managed Care – PPO

## 2019-02-17 DIAGNOSIS — Z0489 Encounter for examination and observation for other specified reasons: Secondary | ICD-10-CM | POA: Diagnosis not present

## 2019-02-17 DIAGNOSIS — Z8616 Personal history of COVID-19: Secondary | ICD-10-CM | POA: Diagnosis not present

## 2019-02-17 DIAGNOSIS — Z7689 Persons encountering health services in other specified circumstances: Secondary | ICD-10-CM

## 2019-02-17 NOTE — ED Triage Notes (Signed)
Patient presents to Urgent Care with complaints of needing a letter to return to work since testing positive for covid on 02/02/2019. Patient reports he has been symptom free for several days.

## 2019-02-17 NOTE — ED Provider Notes (Signed)
[facility]    CSN: [id number] Arrival date & time: 02/17/19  0355      History   Chief Complaint Chief Complaint  Patient presents with  . Appointment    8.30  . Letter for School/Work    HPI MILBERN DOESCHER is a 64 y.o. male.   64 year old male with past medical history of asthma, CAD, cardiomyopathy, congestive heart failure, diabetes, hypercholesterolemia, hypertension, MI, prostate cancer that presents today with letter to return to work. He was dx with COVID on 02/02/19. He was hospitalized for COVID pneumonia and received treatment. Pt reports he is symptom free and no fever. No chest pain, SOB. Wants to return to work tomorrow.   ROS per HPI      Past Medical History:  Diagnosis Date  . Asthma   . CAD (coronary artery disease)    a. non obst dz by cath 2004  b. 11/12/15 which showed no obvious large vessel CAD. No PCI or clear reason for NSTEMI ( possibly hypertensive urgency?).   [name] CARDIOMYOPATHY    a. NICM, LVEF 30% 01/2011 echo; 20-25% 07/2012 echo, EF recovered by echo 06/2015.  . Congestive heart failure (CHF) ([place])   . DIABETES MELLITUS, TYPE II   . HYPERCHOLESTEROLEMIA   . HYPERTENSION   . Myocardial infarct ([place]) 10/2015  . Noncompliance   . Prostate cancer [facility])    prostate cancer  . PVCs (premature ventricular contractions)    a. s/p ablation 01-07-2013 by Dr Rayann Heman    Patient Active Problem List   Diagnosis Date Noted  . COVID-19 virus infection 02/07/2019  . Diarrhea 02/07/2019  . Acute on chronic renal failure ([place]) 02/06/2019  . Routine general medical examination at a health care facility 03/12/2018  . Balanitis 10/15/2016  . NSTEMI (non-ST elevated myocardial infarction) ([place]) 11/13/2015  . PVCs (premature ventricular contractions)   . CAD (coronary artery disease)   . Acute kidney injury ([place]) 07/19/2015  . AKI (acute kidney injury) ([place]) 07/19/2015  . Hyperglycemia 07/12/2015  . Prostate cancer ([place]) 02/22/2014  .  Cholelithiasis and cholecystitis without obstruction 11/16/2013  . PVC's (premature ventricular contractions) 01/07/2013  . CKD (chronic kidney disease) stage 2, GFR 60-89 ml/min 08/25/2012  . OSA (obstructive sleep apnea) 08/25/2012  . Chronic systolic heart failure ([place]) 08/06/2012  . HTN (hypertension), malignant 02/12/2011  . Non compliance w medication regimen 02/12/2011  . HYPERCHOLESTEROLEMIA 09/17/2009  . PREMATURE VENTRICULAR CONTRACTIONS 09/17/2009  . PSA, INCREASED 04/26/2009  . Diabetes type 2, uncontrolled ([place]) 04/25/2009  . CARDIOMYOPATHY 04/25/2009    Past Surgical History:  Procedure Laterality Date  . ABLATION  01-07-2013   RVOT PVC's ablated by Dr Rayann Heman along anteroseptal RVOT  . CARDIAC CATHETERIZATION  2004   nonobst dz  . CARDIAC CATHETERIZATION N/A 11/12/2015   Procedure: Left Heart Cath and Coronary Angiography;  Surgeon: Sherren Mocha, MD;  Location: [place] CV LAB;  Service: Cardiovascular;  Laterality: N/A;  . CHOLECYSTECTOMY N/A 11/16/2013   Procedure: LAPAROSCOPIC CHOLECYSTECTOMY WITH INTRAOPERATIVE CHOLANGIOGRAM;  Surgeon: [name], MD;  Location: [facility];  Service: General;  Laterality: N/A;  . LYMPHADENECTOMY Bilateral 02/22/2014   Procedure: PELVIC LYMPH NODE DISSECTION;  Surgeon: [name], MD;  Location: [facility];  Service: Urology;  Laterality: Bilateral;  . PROSTATE SURGERY    . ROBOT ASSISTED LAPAROSCOPIC RADICAL PROSTATECTOMY N/A 02/22/2014   Procedure: ROBOTIC ASSISTED LAPAROSCOPIC RADICAL PROSTATECTOMY WITH INDOCYANINE GREEN DYE AND OPEN UMBILICAL HERNIA REPAIR;  Surgeon: [name], MD;  Location:  [facility];  Service: Urology;  Laterality: N/A;  . SUPRAVENTRICULAR TACHYCARDIA ABLATION N/A 09/28/2012   Procedure: Tanna Furry Ablation;  Surgeon: [name], MD;  Location: [facility] CATH LAB;  Service: Cardiovascular;  Laterality: N/A;  . SURGERY SCROTAL / TESTICULAR     at age 78  . V-TACH ABLATION N/A 01/07/2013   Procedure: V-TACH  ABLATION;  Surgeon: [name], MD;  Location: [facility] CATH LAB;  Service: Cardiovascular;  Laterality: N/A;       Home Medications    Prior to Admission medications   Medication Sig Start Date End Date Taking? Authorizing Provider  aspirin EC 81 MG EC tablet Take 1 tablet (81 mg total) by mouth daily. 11/14/15   Eileen Stanford, PA-C  atorvastatin (LIPITOR) 80 MG tablet Take 1 tablet (80 mg total) by mouth daily. 08/23/18   Marrian Salvage, FNP  carvedilol (COREG) 25 MG tablet Take 1 tablet (25 mg total) by mouth 2 (two) times daily with a meal. 03/29/18   Isaiah Serge, NP  clopidogrel (PLAVIX) 75 MG tablet Take 1 tablet (75 mg total) by mouth daily. **NEED TO ESTABLISH WITH NEW PROVIDER FOR FUTURE REFILLS* 08/02/18   Plotnikov, Evie Lacks, MD  Ensure Max Protein (ENSURE MAX PROTEIN) LIQD Take 330 mLs (11 oz total) by mouth daily. 02/10/19   Hongalgi, Lenis Dickinson, MD  Insulin Glargine (LANTUS) 100 UNIT/ML Solostar Pen Inject 35 Units into the skin every morning. And pen needles 2/day 02/10/19   Hongalgi, Lenis Dickinson, MD  isosorbide mononitrate (IMDUR) 60 MG 24 hr tablet TAKE 1 & 1/2 (ONE & ONE-HALF) TABLETS BY MOUTH ONCE DAILY Patient taking differently: Take 90 mg by mouth daily.  12/17/18   [name], NP  Multiple Vitamin (MULTIVITAMIN WITH MINERALS) TABS tablet Take 1 tablet by mouth daily. 02/10/19   Hongalgi, Lenis Dickinson, MD    Family History Family History  Problem Relation Age of Onset  . Hypertension Mother   . Peripheral vascular disease Mother   . Diabetes Mother   . Peripheral vascular disease Other   . Hypertension Other   . Prostate cancer Other   . Prostate cancer Brother   . Lung cancer Brother   . Heart attack Paternal Grandfather   . Heart failure Maternal Grandmother   . Diabetes Son   . Colon cancer Sister   . Prostate cancer Brother   . Prostate cancer Brother   . Prostate cancer Brother   . Pancreatic cancer Paternal Aunt   . Esophageal cancer Neg Hx      Social History Social History   Tobacco Use  . Smoking status: Never Smoker  . Smokeless tobacco: Never Used  . Tobacco comment: works for [employer]  Substance Use Topics  . Alcohol use: Yes    Comment: rare  . Drug use: No     Allergies   Ambien [zolpidem tartrate] and Metformin and related   Review of Systems Review of Systems   Physical Exam Triage Vital Signs ED Triage Vitals  Enc Vitals Group     BP 02/17/19 0836 (!) 148/88     Pulse Rate 02/17/19 0836 83     Resp 02/17/19 0836 18     Temp 02/17/19 0836 98.2 F (36.8 C)     Temp Source 02/17/19 0836 Oral     SpO2 02/17/19 0836 99 %     Weight --      Height --  Head Circumference --      Peak Flow --      Pain Score 02/17/19 0835 0     Pain Loc --      Pain Edu? --      Excl. in [place]? --    No data found.  Updated Vital Signs BP (!) 148/88 (BP Location: Left Arm)   Pulse 83   Temp 98.2 F (36.8 C) (Oral)   Resp 18   SpO2 99%   Visual Acuity Right Eye Distance:   Left Eye Distance:   Bilateral Distance:    Right Eye Near:   Left Eye Near:    Bilateral Near:     Physical Exam Vitals and nursing note reviewed.  Constitutional:      General: He is not in acute distress.    Appearance: Normal appearance. He is not ill-appearing, toxic-appearing or diaphoretic.  HENT:     Head: Normocephalic and atraumatic.     Nose: Nose normal.     Mouth/Throat:     Pharynx: Oropharynx is clear.  Eyes:     Conjunctiva/sclera: Conjunctivae normal.  Cardiovascular:     Rate and Rhythm: Normal rate and regular rhythm.     Pulses: Normal pulses.     Heart sounds: Normal heart sounds.  Pulmonary:     Effort: Pulmonary effort is normal.  Musculoskeletal:        General: Normal range of motion.     Cervical back: Normal range of motion.  Skin:    General: Skin is warm and dry.  Neurological:     Mental Status: He is alert.  Psychiatric:        Mood and Affect:  Mood normal.      UC Treatments / Results  Labs (all labs ordered are listed, but only abnormal results are displayed) Labs Reviewed - No data to display  EKG   Radiology No results found.  Procedures Procedures (including critical care time)  Medications Ordered in UC Medications - No data to display  Initial Impression / Assessment and Plan / UC Course  I have reviewed the triage vital signs and the nursing notes.  Pertinent labs & imaging results that were available during my care of the patient were reviewed by me and considered in my medical decision making (see chart for details).     Exam normal- pt having no symptoms and fever free.  He has met all requirements to return to work.  Note given to return  Final Clinical Impressions(s) / UC Diagnoses   Final diagnoses:  Return to work evaluation     Discharge Instructions     Good to return to work     ED Prescriptions    None     PDMP not reviewed this encounter.   Orvan July, NP 02/18/19 1525

## 2019-02-17 NOTE — Discharge Instructions (Signed)
Good to return to work

## 2019-02-18 ENCOUNTER — Ambulatory Visit: Payer: BC Managed Care – PPO | Admitting: Family

## 2019-02-18 NOTE — Telephone Encounter (Signed)
Spoke with patient and he will have cardiology order labs for him. He advised to cancel appointment since we don't really manage much for him.

## 2019-02-18 NOTE — Telephone Encounter (Signed)
Yes, he should plan to get his labs. If he wants to see if his cardiologist will do these for him at that appointment on 2/1, he could just have them done there.

## 2019-02-23 NOTE — Progress Notes (Deleted)
CARDIOLOGY OFFICE NOTE  Date:  02/23/2019    [name] Date of Birth: 10-23-1955 Medical Record [id number]  PCP:  Bradley Salvage, FNP  Cardiologist:  [name]    No chief complaint on file.   History of Present Illness: [name] is a 64 y.o. male who presents today for a follow up visit.  Seen for Dr. Marlou Porch.   He has a history of nonobstructive CAD with prior NSTEMI 2017 (no intervention needed - nonobstructive disease noted),HTN, PVC s/p RVOT ablationin 2014,NICM withchronic systolic HF, CKD stage III, prostate cancer treated with robotic prostatectomy in 2016 andpoorly controlledDM II. EF has been as low as 20 to 25 in 2014 - now up to 55 to 60% per echo in 2017 with grade I DD and with severe LVH.   He has had issues with ongoing dyspnea. Has had elevation with his BP.Wasseen by Bradley Kicks, NP in early March of 2020 - again for increasing DOE. BP was again elevated.Coreg was increased at that time.   I then had a virtual visit with him at the end of March - ended up increasing his Imdur to 90 mg to help with his shortness of breath with heavy exertion.We did a telehealth visit again in May - he was doing better with the addition oftheincreased nitrate therapy. I saw him here in the office back in September - endorsing fatigue and a sensation of heart racing- this was new for him. Breathing still improved with the prior increase in his nitrate therapy. 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 Otherwise watching TV all day. Diabetes poorly controlled. I recommended getting a Myoview and event monitor - slow to complete but turned out ok. EF is better by echo.  Last seen in October by me.    Hospitalized in January 2021 with COVID. Presented with weakness, diarrhea and hypotension. Had AKI - Medicines held including ACE and HCTZ. Received course of Remdesivir.   The patient {does/does not:200015}  have symptoms concerning for COVID-19 infection (fever, chills, cough, or new shortness of breath).   Comes in today. Here with   Past Medical History:  Diagnosis Date  . Asthma   . CAD (coronary artery disease)    a. non obst dz by cath 2004  b. 11/12/15 which showed no obvious large vessel CAD. No PCI or clear reason for NSTEMI ( possibly hypertensive urgency?).   [name] CARDIOMYOPATHY    a. NICM, LVEF 30% 01/2011 echo; 20-25% 07/2012 echo, EF recovered by echo 06/2015.  . Congestive heart failure (CHF) ([name])   . DIABETES MELLITUS, TYPE II   . HYPERCHOLESTEROLEMIA   . HYPERTENSION   . Myocardial infarct ([name]) 10/2015  . Noncompliance   . Prostate cancer [facility])    prostate cancer  . PVCs (premature ventricular contractions)    a. s/p ablation 01-07-2013 by Dr Rayann Heman    Past Surgical History:  Procedure Laterality Date  . ABLATION  01-07-2013   RVOT PVC's ablated by Dr Rayann Heman along anteroseptal RVOT  . CARDIAC CATHETERIZATION  2004   nonobst dz  . CARDIAC CATHETERIZATION N/A 11/12/2015   Procedure: Left Heart Cath and Coronary Angiography;  Surgeon: Bradley Mocha, MD;  Location: [place] CV LAB;  Service: Cardiovascular;  Laterality: N/A;  . CHOLECYSTECTOMY N/A 11/16/2013   Procedure: LAPAROSCOPIC CHOLECYSTECTOMY WITH INTRAOPERATIVE CHOLANGIOGRAM;  Surgeon: Bradley Seltzer, MD;  Location: [facility];  Service: General;  Laterality: N/A;  . LYMPHADENECTOMY Bilateral 02/22/2014   Procedure: PELVIC LYMPH NODE DISSECTION;  Surgeon: Bradley Frock, MD;  Location: [facility];  Service: Urology;  Laterality: Bilateral;  . PROSTATE SURGERY    . ROBOT ASSISTED LAPAROSCOPIC RADICAL PROSTATECTOMY N/A 02/22/2014   Procedure: ROBOTIC ASSISTED LAPAROSCOPIC RADICAL PROSTATECTOMY WITH INDOCYANINE GREEN DYE AND OPEN UMBILICAL HERNIA REPAIR;  Surgeon: Bradley Frock, MD;  Location: [facility];  Service: Urology;  Laterality: N/A;  . SUPRAVENTRICULAR TACHYCARDIA ABLATION N/A 09/28/2012   Procedure: Tanna Furry Ablation;   Surgeon: Bradley Grayer, MD;  [facility];  Service: Cardiovascular;  Laterality: N/A;  . SURGERY SCROTAL / TESTICULAR     at age 36  . V-TACH ABLATION N/A 01/07/2013   Procedure: V-TACH ABLATION;  Surgeon: Bradley Mark, MD;  Location: [place] CATH LAB;  Service: Cardiovascular;  Laterality: N/A;     Medications: No outpatient medications have been marked as taking for the 02/28/19 encounter (Appointment) with Bradley Junes, NP.     Allergies: Allergies  Allergen Reactions  . Ambien [Zolpidem Tartrate] Other (See Comments)    Pt states that this medication made him go crazy.   . Metformin And Related Diarrhea    Social History: The patient  reports that he has never smoked. He has never used smokeless tobacco. He reports current alcohol use. He reports that he does not use drugs.   Family History: The patient's ***family history includes Colon cancer in his sister; Diabetes in his mother and son; Heart attack in his paternal grandfather; Heart failure in his maternal grandmother; Hypertension in his mother and another family member; Lung cancer in his brother; Pancreatic cancer in his paternal aunt; Peripheral vascular disease in his mother and another family member; Prostate cancer in his brother, brother, brother, brother and another family member.   Review of Systems: Please see the history of present illness.   All other systems are reviewed and negative.   Physical Exam: VS:  There were no vitals taken for this visit. [name]  BMI There is no height or weight on file to calculate BMI.  Wt Readings from Last 3 Encounters:  02/07/19 181 lb 14.1 oz (82.5 kg)  12/02/18 189 lb (85.7 kg)  11/22/18 189 lb 6.4 oz (85.9 kg)    General: Pleasant. Well developed, well nourished and in no acute distress.   HEENT: Normal.  Neck: Supple, no JVD, carotid bruits, or masses noted.  Cardiac: ***Regular rate and rhythm. No murmurs, rubs, or gallops. No edema.  Respiratory:  Lungs are  clear to auscultation bilaterally with normal work of breathing.  GI: Soft and nontender.  MS: No deformity or atrophy. Gait and ROM intact.  Skin: Warm and dry. Color is normal.  Neuro:  Strength and sensation are intact and no gross focal deficits noted.  Psych: Alert, appropriate and with normal affect.   LABORATORY DATA:  EKG:  EKG {ACTION; IS/IS [id number] ordered today. This demonstrates ***.  Lab Results  Component Value Date   WBC 4.8 02/09/2019   HGB 12.2 (L) 02/09/2019   HCT 36.2 (L) 02/09/2019   PLT 213 02/09/2019   GLUCOSE 165 (H) 02/09/2019   CHOL 86 (L) 10/11/2018   TRIG 210 (H) 02/06/2019   HDL 30 (L) 10/11/2018   LDLDIRECT 148.8 04/25/2009   LDLCALC 39 10/11/2018   ALT 34 02/09/2019   AST 43 (H) 02/09/2019   NA 138 02/09/2019   K 3.8 02/09/2019   CL 109 02/09/2019   CREATININE 1.24 02/09/2019  BUN 17 02/09/2019   CO2 19 (L) 02/09/2019   TSH 1.140 10/11/2018   PSA 0.04 (L) 08/23/2018   INR 0.95 01/25/2016   HGBA1C 9.2 (H) 02/07/2019   MICROALBUR 0.9 10/15/2016     BNP (last 3 results) No results for input(s): BNP in the last 8760 hours.  ProBNP (last 3 results) Recent Labs    03/29/18 1621  PROBNP 129     Other Studies Reviewed Today:  ECHO IMPRESSIONS 11/2018    1. Left ventricular ejection fraction, by visual estimation, is 50 to 55%. The left ventricle has normal function. There is severely increased left ventricular hypertrophy.  2. Left ventricular diastolic parameters are consistent with Grade I diastolic dysfunction (impaired relaxation).  3. Global right ventricle has normal systolic function.The right ventricular size is normal.  4. Left atrial size was normal.  5. Right atrial size was normal.  6. The mitral valve is normal in structure. Trace mitral valve regurgitation. No evidence of mitral stenosis.  7. The tricuspid valve is normal in structure. Tricuspid valve regurgitation is trivial.  8. The aortic valve is  tricuspid. Aortic valve regurgitation is not visualized. No evidence of aortic valve sclerosis or stenosis.  9. The pulmonic valve was normal in structure. Pulmonic valve regurgitation is not visualized. 10. Low normal to mildly reduced LV systolic function (EF 50); severe LVH; grade 1 diastolic dysfunction; trace MR and TR.  Myoview Study Highlights 11/2018   Nuclear stress EF: 42%. The left ventricular ejection fraction is moderately decreased (30-44%).  There was no ST segment deviation noted during stress.  This is an intermediate risk study. There is no evidence of ischemia or previous infarct.  Thre is moderate LV dilitation with moderate LV dysfunction .      Left Heart Cath and Coronary AngiographyOctober 2017  Conclusion     Mid RCA lesion, 30 %stenosed.  Mid LAD lesion, 25 %stenosed.  1st Diag lesion, 30 %stenosed.  There is hyperdynamic left ventricular systolic function.  The left ventricular ejection fraction is greater than 65% by visual estimate.  1. Mild nonobstructive CAD as outlined, with mild plaque in the mid-RCA, mid-LAD, and first diagonal branch 2. Vigorous LV systolic function with normal LVEDP  No clear explanation for NSTEMI. Hypertensive emergency is a possibility. There is a tiny branch collateral possibly supplying the distal circumflex distribution from the RCA - it is possible this caused NSTEMI but the vessel is very small and occlusion is not visualized.     Echo: 07/19/2015 LV EF: 55% - 60% Study Conclusion - Left ventricle: The cavity size was mildly dilated. There was severe concentric hypertrophy. Systolic function was normal. The estimated ejection fraction was in the range of 55% to 60%. Wall motion was normal; there were no regional wall motion abnormalities. Doppler parameters are consistent with abnormal left ventricular relaxation (grade 1 diastolic dysfunction). - Left atrium: The atrium was mildly  dilated    ASSESSMENT & PLAN:   1.Recent COVID 19 illness  2. Prior issues ofSOB-with over exertion -may be hisanginal equivalent but thishas also been a chronic issue - probably multifactorial. Still with fatigue - not clear etiology - he is ready to proceed with stress testing now.   2. HTN - BP is good today - no changes made today.   3. CAD - per cath in 2017 - was non obstructive - medically managed and needs aggressive CV risk factor modification.   2. HTN- known LVH -BP now is fine - just  took all his medicines 2 hours prior to this visit - he is not symptomatic - would follow for now.  3. Nonobstructive CAD on cath in 2017-managed medically with CV risk factor modification.No overt chest pain butsays his fatigue persists. He does remain on Plavix and we can stop this if his study is ok. I still suspect some of his symptoms are more life style related.  4. DM type 2 with associated CKD - needs to get back to see Dr. Loanne Drilling.   5. Known abnormal EKG secondary to severe concentric hypertrophy.  6. Palpitations - history of PVCs/Vtach with prior ablation - he has just finished his monitor - will let him know those results. He remains on beta blocker therapy.   7. Prostate Ca - not discussed  8. CKD - little worse - recheck lab today.   9. Health maintenance - flu shot today.    [name] COVID-19 Education: The signs and symptoms of COVID-19 were discussed with the patient and how to seek care for testing (follow up with PCP or arrange E-visit).  The importance of social distancing, staying at home, hand hygiene and wearing a mask when out in public were discussed today.  Current medicines are reviewed with the patient today.  The patient does not have concerns regarding medicines other than what has been noted above.  The following changes have been made:  See above.  Labs/ tests ordered today include:   No orders of the defined types were placed  in this encounter.    Disposition:   FU with *** in {gen number [id number] {Days to years:10300}.   Patient is agreeable to this plan and will call if any problems develop in the interim.   SignedTruitt Merle, NP  02/23/2019 8:23 AM  [facility] [address] [place], [place]  [address] Phone: [phone] Fax: [phone]

## 2019-02-25 ENCOUNTER — Ambulatory Visit: Payer: BC Managed Care – PPO | Admitting: Family

## 2019-02-28 ENCOUNTER — Ambulatory Visit: Payer: BC Managed Care – PPO | Admitting: Nurse Practitioner

## 2019-03-02 ENCOUNTER — Ambulatory Visit: Payer: BC Managed Care – PPO | Admitting: Nurse Practitioner

## 2019-03-14 ENCOUNTER — Encounter: Payer: BLUE CROSS/BLUE SHIELD | Admitting: Nurse Practitioner

## 2019-03-25 ENCOUNTER — Other Ambulatory Visit: Payer: Self-pay | Admitting: Endocrinology

## 2019-03-25 NOTE — Telephone Encounter (Signed)
Please advise 

## 2019-03-25 NOTE — Telephone Encounter (Signed)
1.  Please schedule f/u appt 2.  Then please refill x 1, pending that appt.  

## 2019-03-28 ENCOUNTER — Other Ambulatory Visit: Payer: Self-pay

## 2019-03-28 DIAGNOSIS — E1165 Type 2 diabetes mellitus with hyperglycemia: Secondary | ICD-10-CM

## 2019-03-28 MED ORDER — INSULIN GLARGINE 100 UNIT/ML SOLOSTAR PEN: 35.0000 [IU] | PEN_INJECTOR | SUBCUTANEOUS | 0 refills | Status: DC

## 2019-03-28 NOTE — Telephone Encounter (Signed)
Patient is scheduled for a f/u and would like to know if he can get a refill on Lantus to get him through until his appointment on Friday-please advise

## 2019-03-28 NOTE — Telephone Encounter (Signed)
Outpatient Medication Detail   Disp Refills Start End   Insulin Glargine (LANTUS) 100 UNIT/ML Solostar Pen 10.5 mL 0 03/28/2019    Sig - Route: Inject 35 Units into the skin every morning. - Subcutaneous   Sent to pharmacy as: Insulin Glargine (LANTUS) 100 UNIT/ML Solostar Pen   E-Prescribing Status: Receipt confirmed by pharmacy (03/28/2019  9:45 AM EST)

## 2019-03-30 ENCOUNTER — Other Ambulatory Visit: Payer: Self-pay

## 2019-04-01 ENCOUNTER — Ambulatory Visit (INDEPENDENT_AMBULATORY_CARE_PROVIDER_SITE_OTHER): Payer: BC Managed Care – PPO | Admitting: Endocrinology

## 2019-04-01 ENCOUNTER — Other Ambulatory Visit: Payer: Self-pay

## 2019-04-01 ENCOUNTER — Encounter: Payer: Self-pay | Admitting: Endocrinology

## 2019-04-01 VITALS — BP 158/88 | HR 95 | Ht 65.0 in | Wt 187.6 lb

## 2019-04-01 DIAGNOSIS — E1165 Type 2 diabetes mellitus with hyperglycemia: Secondary | ICD-10-CM

## 2019-04-01 LAB — POCT GLYCOSYLATED HEMOGLOBIN (HGB A1C): Hemoglobin A1C: 8 % — AB (ref 4.0–5.6)

## 2019-04-01 MED ORDER — INSULIN GLARGINE 100 UNIT/ML SOLOSTAR PEN: 45.0000 [IU] | PEN_INJECTOR | SUBCUTANEOUS | 11 refills | Status: DC

## 2019-04-01 NOTE — Patient Instructions (Addendum)
For now, please increase the insulin to 45 units each morning, and:   On this type of insulin schedule, you should eat meals on a regular schedule.  If a meal is missed or significantly delayed, your blood sugar could go low.   check your blood sugar twice a day.  vary the time of day when you check, between before the 3 meals, and at bedtime.  also check if you have symptoms of your blood sugar being too high or too low.  please keep a record of the readings and bring it to your next appointment here (or you can bring the meter itself).  You can write it on any piece of paper.  please call us sooner if your blood sugar goes below 70, or if you have a lot of readings over 200. Please come back for a follow-up appointment in 2 months.

## 2019-04-01 NOTE — Progress Notes (Signed)
Subjective:    Patient ID: [name], male    DOB: [birth year], 64 y.o.   MRN: [id number]  HPI Pt returns for f/u of diabetes mellitus: DM type: Insulin-requiring type 2 Dx'ed: [id number] Complications: CAD Therapy: insulin since mid-2018. DKA: never Severe hypoglycemia: never Pancreatitis: never Pancreatic imaging: normal on 2002 CT Other: due to noncompliance, he is not a candidate for multiple daily injections.   Interval history: pt states he feels well in general.  He is again overdue for f/u. He says he does not miss the insulin.  He brings his meter with his cbg's which I have reviewed today. cbg varies from 130-313.  He was rx'ed for coronavirus, but no steroids.  He will retire soon.   Past Medical History:  Diagnosis Date  . Asthma   . CAD (coronary artery disease)    a. non obst dz by cath 2004  b. 11/12/15 which showed no obvious large vessel CAD. No PCI or clear reason for NSTEMI ( possibly hypertensive urgency?).   [name] CARDIOMYOPATHY    a. NICM, LVEF 30% 01/2011 echo; 20-25% 07/2012 echo, EF recovered by echo 06/2015.  . Congestive heart failure (CHF) ([place])   . DIABETES MELLITUS, TYPE II   . HYPERCHOLESTEROLEMIA   . HYPERTENSION   . Myocardial infarct ([place]) 10/2015  . Noncompliance   . Prostate cancer [facility])    prostate cancer  . PVCs (premature ventricular contractions)    a. s/p ablation 01-07-2013 by Dr Rayann Heman    Past Surgical History:  Procedure Laterality Date  . ABLATION  01-07-2013   RVOT PVC's ablated by Dr Rayann Heman along anteroseptal RVOT  . CARDIAC CATHETERIZATION  2004   nonobst dz  . CARDIAC CATHETERIZATION N/A 11/12/2015   Procedure: Left Heart Cath and Coronary Angiography;  Surgeon: Sherren Mocha, MD;  Location: [place] CV LAB;  Service: Cardiovascular;  Laterality: N/A;  . CHOLECYSTECTOMY N/A 11/16/2013   Procedure: LAPAROSCOPIC CHOLECYSTECTOMY WITH INTRAOPERATIVE CHOLANGIOGRAM;  Surgeon: [name], MD;  [facility];  Service:  General;  Laterality: N/A;  . LYMPHADENECTOMY Bilateral 02/22/2014   Procedure: PELVIC LYMPH NODE DISSECTION;  Surgeon: [name], MD;  [facility]: [facility];  Service: Urology;  Laterality: Bilateral;  . PROSTATE SURGERY    . ROBOT ASSISTED LAPAROSCOPIC RADICAL PROSTATECTOMY N/A 02/22/2014   Procedure: ROBOTIC ASSISTED LAPAROSCOPIC RADICAL PROSTATECTOMY WITH INDOCYANINE GREEN DYE AND OPEN UMBILICAL HERNIA REPAIR;  Surgeon: [name], MD;  [facility];  Service: Urology;  Laterality: N/A;  . SUPRAVENTRICULAR TACHYCARDIA ABLATION N/A 09/28/2012   Procedure: Tanna Furry Ablation;  Surgeon: [name], MD;  Location: [facility] CATH LAB;  Service: Cardiovascular;  Laterality: N/A;  . SURGERY SCROTAL / TESTICULAR     at age 56  . V-TACH ABLATION N/A 01/07/2013   Procedure: V-TACH ABLATION;  Surgeon: [name], MD;  Location: [place] CATH LAB;  Service: Cardiovascular;  Laterality: N/A;    Social History   Socioeconomic History  . Marital status: Single    Spouse name: Not on file  . Number of children: Not on file  . Years of education: Not on file  . Highest education level: Not on file  Occupational History  . Occupation: Pension scheme manager: [employer]  Tobacco Use  . Smoking status: Never Smoker  . Smokeless tobacco: Never Used  . 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 Alcohol use: Yes    Comment: rare  .  Drug use: No  . Sexual activity: Never  Other Topics Concern  . Not on file  Social History Narrative   Single - divorced x 3 -   Lives with 2 sons, enjoys spending time with 6 g-kids and 3 kids   Social Determinants of Health   Financial Resource Strain:   . Difficulty of Paying Living Expenses: Not on file  Food Insecurity:   . Worried About Charity fundraiser in the Last Year: Not on file  . Ran Out of Food in the Last Year: Not on file  Transportation Needs:   . Lack of Transportation  (Medical): Not on file  . Lack of Transportation (Non-Medical): Not on file  Physical Activity:   . Days of Exercise per Week: Not on file  . Minutes of Exercise per Session: Not on file  Stress:   . Feeling of Stress : Not on file  Social Connections:   . Frequency of Communication with Friends and Family: Not on file  . Frequency of Social Gatherings with Friends and Family: Not on file  . Attends Religious Services: Not on file  . Active Member of Clubs or Organizations: Not on file  . Attends Archivist Meetings: Not on file  . Marital Status: Not on file  Intimate Partner Violence:   . Fear of Current or Ex-Partner: Not on file  . Emotionally Abused: Not on file  . Physically Abused: Not on file  . Sexually Abused: Not on file    Current Outpatient Medications on File Prior to Visit  Medication Sig Dispense Refill  . aspirin EC 81 MG EC tablet Take 1 tablet (81 mg total) by mouth daily.    [name] atorvastatin (LIPITOR) 80 MG tablet Take 1 tablet (80 mg total) by mouth daily. 90 tablet 2  . carvedilol (COREG) 25 MG tablet Take 1 tablet (25 mg total) by mouth 2 (two) times daily with a meal. 180 tablet 3  . clopidogrel (PLAVIX) 75 MG tablet Take 1 tablet (75 mg total) by mouth daily. **NEED TO ESTABLISH WITH NEW PROVIDER FOR FUTURE REFILLS* 30 tablet 2  . Ensure Max Protein (ENSURE MAX PROTEIN) LIQD Take 330 mLs (11 oz total) by mouth daily. 330 mL 12  . isosorbide mononitrate (IMDUR) 60 MG 24 hr tablet TAKE 1 & 1/2 (ONE & ONE-HALF) TABLETS BY MOUTH ONCE DAILY (Patient taking differently: Take 90 mg by mouth daily. ) 135 tablet 3  . Multiple Vitamin (MULTIVITAMIN WITH MINERALS) TABS tablet Take 1 tablet by mouth daily.     No current facility-administered medications on file prior to visit.    Allergies  Allergen Reactions  . Ambien [Zolpidem Tartrate] Other (See Comments)    Pt states that this medication made him go crazy.   . Metformin And Related Diarrhea     Family History  Problem Relation Age of Onset  . Hypertension Mother   . Peripheral vascular disease Mother   . Diabetes Mother   . Peripheral vascular disease Other   . Hypertension Other   . Prostate cancer Other   . Prostate cancer Brother   . Lung cancer Brother   . Heart attack Paternal Grandfather   . Heart failure Maternal Grandmother   . Diabetes Son   . Colon cancer Sister   . Prostate cancer Brother   . Prostate cancer Brother   . Prostate cancer Brother   . Pancreatic cancer Paternal Aunt   . Esophageal cancer Neg Hx  BP (!) 158/88 (BP Location: Left Arm, Patient Position: Sitting, Cuff Size: Large)   Pulse 95   Ht 5\' 5"  (1.651 m)   Wt 187 lb 9.6 oz (85.1 kg)   SpO2 95%   BMI 31.22 kg/m    Review of Systems He denies hypoglycemia.      Objective:   Physical Exam VITAL SIGNS:  See vs page GENERAL: no distress Pulses: dorsalis pedis intact bilat.   MSK: no deformity of the feet CV: no leg edema Skin:  no ulcer on the feet.  normal color and temp on the feet. Neuro: sensation is intact to touch on the feet.    Lab Results  Component Value Date   HGBA1C 8.0 (A) 04/01/2019       Assessment & Plan:  Insulin-requiring type 2 DM, with CAD: he needs increased rx.   HTN: recheck next time  Patient Instructions  For now, please increase the insulin to 45 units each morning, and:   On this type of insulin schedule, you should eat meals on a regular schedule.  If a meal is missed or significantly delayed, your blood sugar could go low.   check your blood sugar twice a day.  vary the time of day when you check, between before the 3 meals, and at bedtime.  also check if you have symptoms of your blood sugar being too high or too low.  please keep a record of the readings and bring it to your next appointment here (or you can bring the meter itself).  You can write it on any piece of paper.  please call us sooner if your blood sugar goes below 70, or if you  have a lot of readings over 200. Please come back for a follow-up appointment in 2 months.

## 2019-04-19 ENCOUNTER — Other Ambulatory Visit: Payer: Self-pay | Admitting: Cardiology

## 2019-04-21 ENCOUNTER — Ambulatory Visit: Payer: BC Managed Care – PPO | Attending: Internal Medicine

## 2019-04-21 DIAGNOSIS — Z23 Encounter for immunization: Secondary | ICD-10-CM

## 2019-04-21 NOTE — Progress Notes (Signed)
   Covid-19 Vaccination Clinic  Name:  [name]    MRN: [id number] DOB: 12/28/1955  04/21/2019  Mr. [name] was observed post Covid-19 immunization for 15 minutes without incident. He was provided with Vaccine Information Sheet and instruction to access the V-Safe system.   [name] was instructed to call 911 with any severe reactions post vaccine: [name] Difficulty breathing  . Swelling of face and throat  . A fast heartbeat  . A bad rash all over body  . Dizziness and weakness   Immunizations Administered    Name Date Dose VIS Date Route   Pfizer COVID-19 Vaccine 04/21/2019  2:29 PM 0.3 mL 01/07/2019 Intramuscular   Manufacturer: [place]   Lot: [id number]   [place]: [id number]

## 2019-04-27 ENCOUNTER — Other Ambulatory Visit: Payer: Self-pay | Admitting: Endocrinology

## 2019-04-27 DIAGNOSIS — E1165 Type 2 diabetes mellitus with hyperglycemia: Secondary | ICD-10-CM

## 2019-05-16 ENCOUNTER — Ambulatory Visit: Payer: BC Managed Care – PPO | Attending: Internal Medicine

## 2019-05-16 DIAGNOSIS — Z23 Encounter for immunization: Secondary | ICD-10-CM

## 2019-05-16 NOTE — Progress Notes (Signed)
   Covid-19 Vaccination Clinic  Name:  [name]    MRN: [id number] DOB: [birth year]  05/16/2019  [name] was observed post Covid-19 immunization for 15 minutes without incident. He was provided with Vaccine Information Sheet and instruction to access the V-Safe system.   Mr. Doctor was instructed to call 911 with any severe reactions post vaccine: [name] Difficulty breathing  . Swelling of face and throat  . A fast heartbeat  . A bad rash all over body  . Dizziness and weakness   Immunizations Administered    Name Date Dose VIS Date Route   Pfizer COVID-19 Vaccine 05/16/2019 12:13 PM 0.3 mL 03/23/2018 Intramuscular   Manufacturer: [place]   Lot: [id number]   [place]: [id number]

## 2019-05-24 ENCOUNTER — Other Ambulatory Visit: Payer: Self-pay | Admitting: Family

## 2019-05-29 ENCOUNTER — Other Ambulatory Visit: Payer: Self-pay | Admitting: Endocrinology

## 2019-05-29 DIAGNOSIS — E1165 Type 2 diabetes mellitus with hyperglycemia: Secondary | ICD-10-CM

## 2019-05-31 ENCOUNTER — Other Ambulatory Visit: Payer: Self-pay

## 2019-06-02 ENCOUNTER — Ambulatory Visit (INDEPENDENT_AMBULATORY_CARE_PROVIDER_SITE_OTHER): Payer: BC Managed Care – PPO | Admitting: Endocrinology

## 2019-06-02 ENCOUNTER — Encounter: Payer: Self-pay | Admitting: Endocrinology

## 2019-06-02 ENCOUNTER — Other Ambulatory Visit: Payer: Self-pay

## 2019-06-02 ENCOUNTER — Other Ambulatory Visit: Payer: Self-pay | Admitting: Nurse Practitioner

## 2019-06-02 VITALS — BP 130/80 | HR 73 | Ht 65.0 in | Wt 184.6 lb

## 2019-06-02 DIAGNOSIS — E1165 Type 2 diabetes mellitus with hyperglycemia: Secondary | ICD-10-CM | POA: Diagnosis not present

## 2019-06-02 LAB — POCT GLYCOSYLATED HEMOGLOBIN (HGB A1C): Hemoglobin A1C: 11.9 % — AB (ref 4.0–5.6)

## 2019-06-02 MED ORDER — LANTUS SOLOSTAR 100 UNIT/ML ~~LOC~~ SOPN: 55.0000 [IU] | PEN_INJECTOR | SUBCUTANEOUS | 3 refills | Status: DC

## 2019-06-02 NOTE — Patient Instructions (Addendum)
For now, please decrease the insulin to 55 units each morning, and:   On this type of insulin schedule, you should eat meals on a regular schedule.  If a meal is missed or significantly delayed, your blood sugar could go low.   check your blood sugar twice a day.  vary the time of day when you check, between before the 3 meals, and at bedtime.  also check if you have symptoms of your blood sugar being too high or too low.  please keep a record of the readings and bring it to your next appointment here (or you can bring the meter itself).  You can write it on any piece of paper.  please call us sooner if your blood sugar goes below 70, or if you have a lot of readings over 200. Please come back for a follow-up appointment in 2 months.

## 2019-06-02 NOTE — Progress Notes (Signed)
Subjective:    Patient ID: [name], male    DOB: [birth year], 64 y.o.   MRN: [id number]  HPI Pt returns for f/u of diabetes mellitus: DM type: Insulin-requiring type 2 Dx'ed: [id number] Complications: CAD and PN Therapy: insulin since mid-2018. DKA: never Severe hypoglycemia: never Pancreatitis: never Pancreatic imaging: normal on 2002 CT Other: due to noncompliance, he is not a candidate for multiple daily injections.   Interval history: pt states he feels well in general.  He says he never misses the insulin.  He brings his meter with his cbg's which I have reviewed today.   Glucose varies from 278-513.  no recent steroids.  He will retire soon. He says he takes 45 units qam.  Past Medical History:  Diagnosis Date  . Asthma   . CAD (coronary artery disease)    a. non obst dz by cath 2004  b. 11/12/15 which showed no obvious large vessel CAD. No PCI or clear reason for NSTEMI ( possibly hypertensive urgency?).   [name] CARDIOMYOPATHY    a. NICM, LVEF 30% 01/2011 echo; 20-25% 07/2012 echo, EF recovered by echo 06/2015.  . Congestive heart failure (CHF) ([place])   . DIABETES MELLITUS, TYPE II   . HYPERCHOLESTEROLEMIA   . HYPERTENSION   . Myocardial infarct ([place]) 10/2015  . Noncompliance   . Prostate cancer [facility])    prostate cancer  . PVCs (premature ventricular contractions)    a. s/p ablation 01-07-2013 by Dr Rayann Heman    Past Surgical History:  Procedure Laterality Date  . ABLATION  01-07-2013   RVOT PVC's ablated by Dr Rayann Heman along anteroseptal RVOT  . CARDIAC CATHETERIZATION  2004   nonobst dz  . CARDIAC CATHETERIZATION N/A 11/12/2015   Procedure: Left Heart Cath and Coronary Angiography;  Surgeon: Sherren Mocha, MD;  Location: [place] [facility];  Service: Cardiovascular;  Laterality: N/A;  . CHOLECYSTECTOMY N/A 11/16/2013   Procedure: LAPAROSCOPIC CHOLECYSTECTOMY WITH INTRAOPERATIVE CHOLANGIOGRAM;  Surgeon: [name], MD;  Location: [facility];  Service: General;   Laterality: N/A;  . LYMPHADENECTOMY Bilateral 02/22/2014   Procedure: PELVIC LYMPH NODE DISSECTION;  Surgeon: [name], MD;  Location: [facility];  Service: Urology;  Laterality: Bilateral;  . PROSTATE SURGERY    . ROBOT ASSISTED LAPAROSCOPIC RADICAL PROSTATECTOMY N/A 02/22/2014   Procedure: ROBOTIC ASSISTED LAPAROSCOPIC RADICAL PROSTATECTOMY WITH INDOCYANINE GREEN DYE AND OPEN UMBILICAL HERNIA REPAIR;  Surgeon: [name], MD;  Location: [facility];  Service: Urology;  Laterality: N/A;  . SUPRAVENTRICULAR TACHYCARDIA ABLATION N/A 09/28/2012   Procedure: Tanna Furry Ablation;  Surgeon: [name], MD;  Location: [facility];  Service: Cardiovascular;  Laterality: N/A;  . SURGERY SCROTAL / TESTICULAR     at age 59  . V-TACH ABLATION N/A 01/07/2013   Procedure: V-TACH ABLATION;  Surgeon: [name], MD;  Location: [place] CATH [facility];  Service: Cardiovascular;  Laterality: N/A;    Social History   Socioeconomic History  . Marital status: Single    Spouse name: Not on file  . Number of children: Not on file  . Years of education: Not on file  . Highest education level: Not on file  Occupational History  . Occupation: Pension scheme manager: [employer]  Tobacco Use  . Smoking status: Never Smoker  . Smokeless tobacco: Never Used  . Tobacco comment: works for [employer] - mental [employer]  Substance and Sexual Activity  . Alcohol use: Yes    Comment: rare  .  Drug use: No  . Sexual activity: Never  Other Topics Concern  . Not on file  Social History Narrative   Single - divorced x 3 -   Lives with 2 sons, enjoys spending time with 6 g-kids and 3 kids   Social Determinants of Health   Financial Resource Strain:   . Difficulty of Paying Living Expenses:   Food Insecurity:   . Worried About Charity fundraiser in the Last Year:   . Arboriculturist in the Last Year:   Transportation Needs:   . Film/video editor (Medical):   [name] Lack of Transportation  (Non-Medical):   Physical Activity:   . Days of Exercise per Week:   . Minutes of Exercise per Session:   Stress:   . Feeling of Stress :   Social Connections:   . Frequency of Communication with Friends and Family:   . Frequency of Social Gatherings with Friends and Family:   . Attends Religious Services:   . Active Member of Clubs or Organizations:   . Attends Archivist Meetings:   [name] Marital Status:   Intimate Partner Violence:   . Fear of Current or Ex-Partner:   . Emotionally Abused:   [name] Physically Abused:   . Sexually Abused:     Current Outpatient Medications on File Prior to Visit  Medication Sig Dispense Refill  . aspirin EC 81 MG EC tablet Take 1 tablet (81 mg total) by mouth daily.    [name] atorvastatin (LIPITOR) 80 MG tablet Take 1 tablet by mouth once daily 90 tablet 1  . carvedilol (COREG) 25 MG tablet TAKE 1 TABLET BY MOUTH TWICE DAILY WITH A MEAL 60 tablet 5  . Ensure Max Protein (ENSURE MAX PROTEIN) LIQD Take 330 mLs (11 oz total) by mouth daily. 330 mL 12  . isosorbide mononitrate (IMDUR) 60 MG 24 hr tablet TAKE 1 & 1/2 (ONE & ONE-HALF) TABLETS BY MOUTH ONCE DAILY (Patient taking differently: Take 90 mg by mouth daily. ) 135 tablet 3  . Multiple Vitamin (MULTIVITAMIN WITH MINERALS) TABS tablet Take 1 tablet by mouth daily.     No current facility-administered medications on file prior to visit.    Allergies  Allergen Reactions  . Ambien [Zolpidem Tartrate] Other (See Comments)    Pt states that this medication made him go crazy.   . Metformin And Related Diarrhea    Family History  Problem Relation Age of Onset  . Hypertension Mother   . Peripheral vascular disease Mother   . Diabetes Mother   . Peripheral vascular disease Other   . Hypertension Other   . Prostate cancer Other   . Prostate cancer Brother   . Lung cancer Brother   . Heart attack Paternal Grandfather   . Heart failure Maternal Grandmother   . Diabetes Son   . Colon cancer  Sister   . Prostate cancer Brother   . Prostate cancer Brother   . Prostate cancer Brother   . Pancreatic cancer Paternal Aunt   . Esophageal cancer Neg Hx     BP 130/80 (BP Location: Left Arm, Patient Position: Sitting, Cuff Size: Normal)   Pulse 73   Ht 5\' 5"  (1.651 m)   Wt 184 lb 9.6 oz (83.7 kg)   SpO2 99%   BMI 30.72 kg/m    Review of Systems He denies hypoglycemia    Objective:   Physical Exam VITAL SIGNS:  See vs page GENERAL: no distress Pulses: dorsalis  pedis intact bilat.   MSK: no deformity of the feet CV: no leg edema Skin:  no ulcer on the feet.  normal color and temp on the feet. Neuro: sensation is intact to touch on the feet, but decreased from normal  Lab Results  Component Value Date   HGBA1C 11.9 (A) 06/02/2019        Assessment & Plan:  Insulin-requiring type 2 DM, with CAD: much worse Noncompliance: he is not a candidate for multiple daily injections  Patient Instructions  For now, please decrease the insulin to 55 units each morning, and:   On this type of insulin schedule, you should eat meals on a regular schedule.  If a meal is missed or significantly delayed, your blood sugar could go low.   check your blood sugar twice a day.  vary the time of day when you check, between before the 3 meals, and at bedtime.  also check if you have symptoms of your blood sugar being too high or too low.  please keep a record of the readings and bring it to your next appointment here (or you can bring the meter itself).  You can write it on any piece of paper.  please call us sooner if your blood sugar goes below 70, or if you have a lot of readings over 200. Please come back for a follow-up appointment in 2 months.

## 2019-06-21 ENCOUNTER — Emergency Department (HOSPITAL_COMMUNITY): Payer: BC Managed Care – PPO

## 2019-06-21 ENCOUNTER — Other Ambulatory Visit: Payer: Self-pay

## 2019-06-21 ENCOUNTER — Encounter (HOSPITAL_COMMUNITY): Payer: Self-pay | Admitting: Emergency Medicine

## 2019-06-21 ENCOUNTER — Emergency Department (HOSPITAL_COMMUNITY)
Admission: EM | Admit: 2019-06-21 | Discharge: 2019-06-21 | Disposition: A | Discharge status: Home / Self Care | Payer: BC Managed Care – PPO | Attending: Emergency Medicine | Admitting: Emergency Medicine

## 2019-06-21 DIAGNOSIS — I2510 Atherosclerotic heart disease of native coronary artery without angina pectoris: Secondary | ICD-10-CM | POA: Diagnosis not present

## 2019-06-21 DIAGNOSIS — Z7901 Long term (current) use of anticoagulants: Secondary | ICD-10-CM | POA: Insufficient documentation

## 2019-06-21 DIAGNOSIS — K921 Melena: Secondary | ICD-10-CM | POA: Diagnosis not present

## 2019-06-21 DIAGNOSIS — N182 Chronic kidney disease, stage 2 (mild): Secondary | ICD-10-CM | POA: Insufficient documentation

## 2019-06-21 DIAGNOSIS — Z8616 Personal history of COVID-19: Secondary | ICD-10-CM | POA: Diagnosis not present

## 2019-06-21 DIAGNOSIS — R55 Syncope and collapse: Secondary | ICD-10-CM | POA: Diagnosis present

## 2019-06-21 DIAGNOSIS — Z7982 Long term (current) use of aspirin: Secondary | ICD-10-CM | POA: Insufficient documentation

## 2019-06-21 DIAGNOSIS — Z79899 Other long term (current) drug therapy: Secondary | ICD-10-CM | POA: Insufficient documentation

## 2019-06-21 DIAGNOSIS — I130 Hypertensive heart and chronic kidney disease with heart failure and stage 1 through stage 4 chronic kidney disease, or unspecified chronic kidney disease: Secondary | ICD-10-CM | POA: Insufficient documentation

## 2019-06-21 DIAGNOSIS — Z8546 Personal history of malignant neoplasm of prostate: Secondary | ICD-10-CM | POA: Insufficient documentation

## 2019-06-21 DIAGNOSIS — I5022 Chronic systolic (congestive) heart failure: Secondary | ICD-10-CM | POA: Insufficient documentation

## 2019-06-21 LAB — CBC
HCT: 36.4 % — ABNORMAL LOW (ref 39.0–52.0)
Hemoglobin: 11.9 g/dL — ABNORMAL LOW (ref 13.0–17.0)
MCH: 30 pg (ref 26.0–34.0)
MCHC: 32.7 g/dL (ref 30.0–36.0)
MCV: 91.7 fL (ref 80.0–100.0)
Platelets: 318 10*3/uL (ref 150–400)
RBC: 3.97 MIL/uL — ABNORMAL LOW (ref 4.22–5.81)
RDW: 12.3 % (ref 11.5–15.5)
WBC: 15.8 10*3/uL — ABNORMAL HIGH (ref 4.0–10.5)
nRBC: 0 % (ref 0.0–0.2)

## 2019-06-21 LAB — BASIC METABOLIC PANEL
Anion gap: 12 (ref 5–15)
BUN: 24 mg/dL — ABNORMAL HIGH (ref 8–23)
CO2: 20 mmol/L — ABNORMAL LOW (ref 22–32)
Calcium: 9.2 mg/dL (ref 8.9–10.3)
Chloride: 105 mmol/L (ref 98–111)
Creatinine, Ser: 1.6 mg/dL — ABNORMAL HIGH (ref 0.61–1.24)
GFR calc Af Amer: 52 mL/min — ABNORMAL LOW (ref >60)
GFR calc non Af Amer: 45 mL/min — ABNORMAL LOW (ref >60)
Glucose, Bld: 203 mg/dL — ABNORMAL HIGH (ref 70–99)
Potassium: 5.2 mmol/L — ABNORMAL HIGH (ref 3.5–5.1)
Sodium: 137 mmol/L (ref 135–145)

## 2019-06-21 LAB — TROPONIN I (HIGH SENSITIVITY)
Troponin I (High Sensitivity): 12 ng/L (ref <18)
Troponin I (High Sensitivity): 9 ng/L (ref <18)

## 2019-06-21 LAB — CBG MONITORING, ED: Glucose-Capillary: 186 mg/dL — ABNORMAL HIGH (ref 70–99)

## 2019-06-21 LAB — POC OCCULT BLOOD, ED: Fecal Occult Bld: POSITIVE — AB

## 2019-06-21 MED ORDER — SODIUM CHLORIDE 0.9 % IV BOLUS
500.0000 mL | Freq: Once | INTRAVENOUS | Status: AC
  Administered 2019-06-21: 500 mL via INTRAVENOUS

## 2019-06-21 MED ORDER — SODIUM CHLORIDE 0.9% FLUSH: 3.0000 mL | Freq: Once | INTRAVENOUS | Status: DC

## 2019-06-21 NOTE — Discharge Instructions (Signed)
Please read and follow all provided instructions.  Your diagnoses today include:  1. Vasovagal syncope   2. Blood in stool     Tests performed today include:  An EKG of your heart - unchanged from previous  A chest x-ray  Cardiac enzymes - a blood test for heart muscle damage, checked twice and is normal  Blood counts and electrolytes  Vital signs. See below for your results today.   Medications prescribed:   None  Take any prescribed medications only as directed.  Follow-up instructions:  Please follow-up with your primary care provider as soon as you can for further evaluation of your symptoms.  This is especially important to monitor for any signs of bleeding in the stool, given your use of Plavix which makes your blood thinner and you more likely to have bleeding.  Please maintain good fluid intake at home to the point where your urine is light yellow to clear.  Return instructions:  SEEK IMMEDIATE MEDICAL ATTENTION IF:  You have severe chest pain, especially if the pain is crushing or pressure-like and spreads to the arms, back, neck, or jaw, or if you have sweating, nausea (feeling sick to your stomach), or shortness of breath. THIS IS AN EMERGENCY. Don't wait to see if the pain will go away. Get medical help at once. Call 911 or [phone] (operator). DO NOT drive yourself to the hospital.   Your chest pain gets worse and does not go away with rest.   You have an attack of chest pain lasting longer than usual, despite rest and treatment with the medications your caregiver has prescribed.   You wake from sleep with chest pain or shortness of breath.  You feel dizzy or faint.  You have chest pain not typical of your usual pain for which you originally saw your caregiver.   You have any other emergent concerns regarding your health.  Additional Information: Chest pain comes from many different causes. Your caregiver has diagnosed you as having chest pain that is not  specific for one problem, but does not require admission.  You are at low risk for an acute heart condition or other serious illness.   Your vital signs today were: BP 114/76   Pulse 81   Temp 98.4 F (36.9 C) (Oral)   Resp 19   Wt 82.6 kg   SpO2 94%   BMI 30.29 kg/m  If your blood pressure (BP) was elevated above 135/85 this visit, please have this repeated by your doctor within one month. --------------

## 2019-06-21 NOTE — ED Provider Notes (Signed)
[facility] EMERGENCY DEPARTMENT Provider Note   CSN: [id number] Arrival date & time: 06/21/19  1115     History Chief Complaint  Patient presents with  . Loss of Consciousness    [name] is a 64 y.o. male.  Patient with h/o asthma, CAD on Plavix, cardiomyopathy, congestive heart failure, diabetes, hypercholesterolemia, hypertension, MI, prostate cancer -- presents after episode of syncope.  Patient works at a group home.  He went to [employer] today and needed to have a bowel movement.  He states that he sat down on the toilet and was straining.  He noted the passage of bright red blood which is not atypical for him when he strains.  Patient began feeling dizzy.  He states that he stood up and then awoke on the floor.  Bystanders called EMS.  On EMS arrival, heart rate was in the 30s and blood pressures were in the 80 systolic range per EMS report.  They gave 500 cc fluid bolus.  Patient denies any chest pain or shortness of breath.  He reportedly hit his head, however denies any headaches or neck pain.  His only current chronic complaint is generalized fatigue that has been ongoing as well as left lower dental pain over the past 2 or 3 days without swelling.  Patient denies black stools or abdominal pain.  No lower extremity swelling or edema.   ECHO: 12/16/2018  1. Left ventricular ejection fraction, by visual estimation, is 50 to  55%. The left ventricle has normal function. There is severely increased  left ventricular hypertrophy.  2. Left ventricular diastolic parameters are consistent with Grade I  diastolic dysfunction (impaired relaxation).      Past Medical History:  Diagnosis Date  . Asthma   . CAD (coronary artery disease)    a. non obst dz by cath 2004  b. 11/12/15 which showed no obvious large vessel CAD. No PCI or clear reason for NSTEMI ( possibly hypertensive urgency?).   [name] CARDIOMYOPATHY    a. NICM, LVEF 30% 01/2011 echo; 20-25% 07/2012 echo,  EF recovered by echo 06/2015.  . Congestive heart failure (CHF) ([place])   . DIABETES MELLITUS, TYPE II   . HYPERCHOLESTEROLEMIA   . HYPERTENSION   . Myocardial infarct ([place]) 10/2015  . Noncompliance   . Prostate cancer [facility])    prostate cancer  . PVCs (premature ventricular contractions)    a. s/p ablation 01-07-2013 by Dr Rayann Heman    Patient Active Problem List   Diagnosis Date Noted  . COVID-19 virus infection 02/07/2019  . Diarrhea 02/07/2019  . Acute on chronic renal failure ([place]) 02/06/2019  . Routine general medical examination at a health care facility 03/12/2018  . Balanitis 10/15/2016  . NSTEMI (non-ST elevated myocardial infarction) ([place]) 11/13/2015  . PVCs (premature ventricular contractions)   . CAD (coronary artery disease)   . Acute kidney injury ([place]) 07/19/2015  . AKI (acute kidney injury) ([place]) 07/19/2015  . Hyperglycemia 07/12/2015  . Prostate cancer ([place]) 02/22/2014  . Cholelithiasis and cholecystitis without obstruction 11/16/2013  . PVC's (premature ventricular contractions) 01/07/2013  . CKD (chronic kidney disease) stage 2, GFR 60-89 ml/min 08/25/2012  . OSA (obstructive sleep apnea) 08/25/2012  . Chronic systolic heart failure ([place]) 08/06/2012  . HTN (hypertension), malignant 02/12/2011  . Non compliance w medication regimen 02/12/2011  . HYPERCHOLESTEROLEMIA 09/17/2009  . PREMATURE VENTRICULAR CONTRACTIONS 09/17/2009  . PSA, INCREASED 04/26/2009  . Diabetes type 2, uncontrolled ([place]) 04/25/2009  . CARDIOMYOPATHY 04/25/2009  Past Surgical History:  Procedure Laterality Date  . ABLATION  01-07-2013   RVOT PVC's ablated by Dr Rayann Heman along anteroseptal RVOT  . CARDIAC CATHETERIZATION  2004   nonobst dz  . CARDIAC CATHETERIZATION N/A 11/12/2015   Procedure: Left Heart Cath and Coronary Angiography;  Surgeon: Sherren Mocha, MD;  Location: [place] CV LAB;  Service: Cardiovascular;  Laterality: N/A;  . CHOLECYSTECTOMY N/A 11/16/2013   Procedure:  LAPAROSCOPIC CHOLECYSTECTOMY WITH INTRAOPERATIVE CHOLANGIOGRAM;  Surgeon: [name], MD;  Location: [facility] ORS;  Service: General;  Laterality: N/A;  . LYMPHADENECTOMY Bilateral 02/22/2014   Procedure: PELVIC LYMPH NODE DISSECTION;  Surgeon: [name], MD;  Location: [facility] ORS;  Service: Urology;  Laterality: Bilateral;  . PROSTATE SURGERY    . ROBOT ASSISTED LAPAROSCOPIC RADICAL PROSTATECTOMY N/A 02/22/2014   Procedure: ROBOTIC ASSISTED LAPAROSCOPIC RADICAL PROSTATECTOMY WITH INDOCYANINE GREEN DYE AND OPEN UMBILICAL HERNIA REPAIR;  Surgeon: [name], MD;  Location: [facility] ORS;  Service: Urology;  Laterality: N/A;  . SUPRAVENTRICULAR TACHYCARDIA ABLATION N/A 09/28/2012   Procedure: Tanna Furry Ablation;  Surgeon: [name], MD;  Location: [facility] CATH LAB;  Service: Cardiovascular;  Laterality: N/A;  . SURGERY SCROTAL / TESTICULAR     at age 61  . V-TACH ABLATION N/A 01/07/2013   Procedure: V-TACH ABLATION;  Surgeon: [name], MD;  Location: [place] CATH LAB;  Service: Cardiovascular;  Laterality: N/A;       Family History  Problem Relation Age of Onset  . Hypertension Mother   . Peripheral vascular disease Mother   . Diabetes Mother   . Peripheral vascular disease Other   . Hypertension Other   . Prostate cancer Other   . Prostate cancer Brother   . Lung cancer Brother   . Heart attack Paternal Grandfather   . Heart failure Maternal Grandmother   . Diabetes Son   . Colon cancer Sister   . Prostate cancer Brother   . Prostate cancer Brother   . Prostate cancer Brother   . Pancreatic cancer Paternal Aunt   . Esophageal cancer Neg Hx     Social History   Tobacco Use  . Smoking status: Never Smoker  . Smokeless tobacco: Never Used  . Tobacco comment: works for [employer]  Substance Use Topics  . Alcohol use: Yes    Comment: rare  . Drug use: No    Home Medications Prior to Admission medications   Medication Sig Start Date End Date Taking?  Authorizing Provider  aspirin EC 81 MG EC tablet Take 1 tablet (81 mg total) by mouth daily. 11/14/15   Eileen Stanford, PA-C  atorvastatin (LIPITOR) 80 MG tablet Take 1 tablet by mouth once daily 05/24/19   Marrian Salvage, FNP  carvedilol (COREG) 25 MG tablet TAKE 1 TABLET BY MOUTH TWICE DAILY WITH A MEAL 04/19/19   Isaiah Serge, NP  clopidogrel (PLAVIX) 75 MG tablet Take 1 tablet (75 mg total) by mouth daily. 06/03/19   Jerline Pain, MD  Ensure Max Protein (ENSURE MAX PROTEIN) LIQD Take 330 mLs (11 oz total) by mouth daily. 02/10/19   Hongalgi, Lenis Dickinson, MD  insulin glargine (LANTUS SOLOSTAR) 100 UNIT/ML Solostar Pen Inject 55 Units into the skin every morning. And pen needles 1/day 06/02/19   Renato Shin, MD  isosorbide mononitrate (IMDUR) 60 MG 24 hr tablet TAKE 1 & 1/2 (ONE & ONE-HALF) TABLETS BY MOUTH ONCE DAILY Patient taking differently: Take 90 mg by mouth daily.  12/17/18  [name], NP  Multiple Vitamin (MULTIVITAMIN WITH MINERALS) TABS tablet Take 1 tablet by mouth daily. 02/10/19   Hongalgi, Lenis Dickinson, MD    Allergies    Ambien [zolpidem tartrate] and Metformin and related  Review of Systems   Review of Systems  Constitutional: Positive for fatigue. Negative for diaphoresis and fever.  Eyes: Negative for redness.  Respiratory: Negative for cough and shortness of breath.   Cardiovascular: Negative for chest pain, palpitations and leg swelling.  Gastrointestinal: Negative for abdominal pain, nausea and vomiting.  Genitourinary: Negative for dysuria.  Musculoskeletal: Negative for back pain and neck pain.  Skin: Negative for rash.  Neurological: Positive for dizziness, syncope and light-headedness.  Psychiatric/Behavioral: The patient is not nervous/anxious.     Physical Exam Updated Vital Signs BP 132/73 (BP Location: Right Arm)   Pulse 78   Temp 98.4 F (36.9 C) (Oral)   Resp (!) 22   Wt 82.6 kg   SpO2 96%   BMI 30.29 kg/m   Physical Exam Vitals  and nursing note reviewed.  Constitutional:      Appearance: He is well-developed.  HENT:     Head: Normocephalic and atraumatic. No raccoon eyes or Battle's sign.     Comments: No scalp tenderness or external signs of trauma.     Right Ear: Tympanic membrane, ear canal and external ear normal. No hemotympanum.     Left Ear: Tympanic membrane, ear canal and external ear normal. No hemotympanum.     Nose: Nose normal.     Mouth/Throat:     Comments: Multiple missing teeth and poor dentition. No gross swelling at site of reported dental pain.  Eyes:     General: Lids are normal.        Right eye: No discharge.        Left eye: No discharge.     Conjunctiva/sclera: Conjunctivae normal.     Pupils: Pupils are equal, round, and reactive to light.     Comments: No visible hyphema  Neck:     Comments: No JVD.  Cardiovascular:     Rate and Rhythm: Normal rate and regular rhythm.     Heart sounds: Normal heart sounds.     Comments: No murmur.  Pulmonary:     Effort: Pulmonary effort is normal.     Breath sounds: Normal breath sounds.  Abdominal:     Palpations: Abdomen is soft.     Tenderness: There is no abdominal tenderness. There is no guarding or rebound.  Genitourinary:    Rectum: Guaiac result positive. Tenderness present. No external hemorrhoid or internal hemorrhoid.     Comments: Stool was yellow/brown with few small red specks Musculoskeletal:        General: No swelling. Normal range of motion.     Cervical back: Normal range of motion and neck supple. No tenderness or bony tenderness.     Thoracic back: No tenderness or bony tenderness.     Lumbar back: No tenderness or bony tenderness.     Comments: No LE edema.   Skin:    General: Skin is warm and dry.  Neurological:     Mental Status: He is alert and oriented to person, place, and time.     GCS: GCS eye subscore is 4. GCS verbal subscore is 5. GCS motor subscore is 6.     Cranial Nerves: No cranial nerve deficit.      Sensory: No sensory deficit.     Coordination: Coordination normal.  Deep Tendon Reflexes: Reflexes are normal and symmetric.     ED Results / Procedures / Treatments   Labs (all labs ordered are listed, but only abnormal results are displayed) Labs Reviewed  BASIC METABOLIC PANEL - Abnormal; Notable for the following components:      Result Value   Potassium 5.2 (*)    CO2 20 (*)    Glucose, Bld 203 (*)    BUN 24 (*)    Creatinine, Ser 1.60 (*)    GFR calc non Af Amer 45 (*)    GFR calc Af Amer 52 (*)    All other components within normal limits  CBC - Abnormal; Notable for the following components:   WBC 15.8 (*)    RBC 3.97 (*)    Hemoglobin 11.9 (*)    HCT 36.4 (*)    All other components within normal limits  CBG MONITORING, ED - Abnormal; Notable for the following components:   Glucose-Capillary 186 (*)    All other components within normal limits  POC OCCULT BLOOD, ED - Abnormal; Notable for the following components:   Fecal Occult Bld POSITIVE (*)    All other components within normal limits  TROPONIN I (HIGH SENSITIVITY)  TROPONIN I (HIGH SENSITIVITY)    ED ECG REPORT   Date: 06/21/2019  Rate: 78  Rhythm: normal sinus rhythm  QRS Axis: left  Intervals: normal  ST/T Wave abnormalities: nonspecific T wave changes  Conduction Disutrbances:left bundle branch block  Narrative Interpretation:   Old EKG Reviewed: unchanged from 02/06/19  I have personally reviewed the EKG tracing and agree with the computerized printout as noted.  Radiology DG Chest 2 View  Result Date: 06/21/2019 CLINICAL DATA:  Syncope EXAM: CHEST - 2 VIEW COMPARISON:  February 06, 2019 FINDINGS: There is slight scarring in the left base. Lungs elsewhere clear. Heart size and pulmonary vascularity are normal. No adenopathy. No pneumothorax. No bone lesions. IMPRESSION: Slight scarring left base. Lungs otherwise clear. Cardiac silhouette within normal limits. Electronically Signed   By:  Lowella Grip III M.D.   On: 06/21/2019 12:01    Procedures Procedures (including critical care time)  Medications Ordered in ED Medications  sodium chloride flush (NS) 0.9 % injection 3 mL (has no administration in time range)  sodium chloride 0.9 % bolus 500 mL (500 mLs Intravenous New Bag/Given 06/21/19 1300)    ED Course  I have reviewed the triage vital signs and the nursing notes.  Pertinent labs & imaging results that were available during my care of the patient were reviewed by me and considered in my medical decision making (see chart for details).  Patient seen and examined. Work-up initiated. EKG reviewed. DRE preformed with NT chaperones. Orthostatics okay. Hgb at baseline. I do not think patient's symptoms are related to blood loss. Awaiting remainder of work-up.    Vital signs reviewed and are as follows: BP 132/73 (BP Location: Right Arm)   Pulse 78   Temp 98.4 F (36.9 C) (Oral)   Resp (!) 22   Wt 82.6 kg   SpO2 96%   BMI 30.29 kg/m   2:56 PM Patient discussed with and seen earlier by Dr. Stark Jock.   Patient clinically with vasovagal syncope.  Second troponin is negative.  He remained stable and looks well.  Vital signs are normalized.  BP 114/76   Pulse 81   Temp 98.4 F (36.9 C) (Oral)   Resp 19   Wt 82.6 kg   SpO2 94%  BMI 30.29 kg/m   Patient encouraged to maintain good hydration at home.  I spent several minutes discussing return instructions with patient.  First of all, he is encouraged to call his doctor and follow-up in the next couple days for recheck.  If he develops persistent or worsening red blood in stool or black stools, he is to return to the emergency department.  We discussed need for close PCP follow-up given his Plavix use.  If he develops recurrent syncope, chest pain, shortness of breath, lower extremity swelling, he needs to return to the emergency department.  Patient verbalizes understanding and states that he is willing to do  this.  He also confirms that he is comfortable with discharged home and ready to go.  IV is being removed by NT at time these instructions were given.    MDM Rules/Calculators/A&P                      Patient with history of heart failure with preserved ejection fraction presents after syncopal episode today.  This occurred right after straining to have a bowel movement.  Patient had positive prodrome of lightheadedness.  He denies any headache or neurological symptoms and has had no decompensation during ED stay and monitoring.  Patient was noted to have heme positive stools however no significant gross blood on digital rectal exam.  Hemoglobin is stable.  I do not suspect that the symptoms today were due to acute blood loss.  Given patient's cardiac history, troponin was checked x2, were normal and stable.  EKG is unchanged from previous.  No signs of ACS.  No significant lower extremity edema, objective signs of DVT, or suspicion for PE.  Patient is not tachycardic or hypoxic.  He looks well and is back to his baseline.  Patient with mildly elevated potassium, but hemolyzed, no EKG findings of hyperkalemia.  Elevated white blood cell count, but no nidus of infection.  Suspect that this is stress related due to syncopal event.   Final Clinical Impression(s) / ED Diagnoses Final diagnoses:  Vasovagal syncope  Blood in stool    Rx / DC Orders ED Discharge Orders    None       Carlisle Cater, PA-C 06/21/19 1505    Veryl Speak, MD 06/23/19 [phone]

## 2019-06-21 NOTE — ED Triage Notes (Signed)
Patient arrives to ED with complaints of a syncopal episode today after a bowel movement. Patient states after his bowel movement he got dizzy, lightheaded, and fell hitting the top right of his head. When EMS arrived patients initial blood pressures were 99991111 systolic and patient went bradycardic with HR in the 30's. EMS gave 500cc of NS and patients BP and HR normalized.

## 2019-06-21 NOTE — ED Notes (Signed)
Patient verbalizes understanding of discharge instructions. Opportunity for questioning and answers were provided. Armband removed by staff, pt discharged from ED.  

## 2019-06-23 ENCOUNTER — Emergency Department (HOSPITAL_COMMUNITY)
Admission: EM | Admit: 2019-06-23 | Discharge: 2019-06-24 | Disposition: A | Discharge status: Home / Self Care | Payer: BC Managed Care – PPO | Attending: Emergency Medicine | Admitting: Emergency Medicine

## 2019-06-23 ENCOUNTER — Other Ambulatory Visit: Payer: Self-pay

## 2019-06-23 ENCOUNTER — Ambulatory Visit (INDEPENDENT_AMBULATORY_CARE_PROVIDER_SITE_OTHER)
Admission: EM | Admit: 2019-06-23 | Discharge: 2019-06-23 | Disposition: A | Discharge status: Home / Self Care | Payer: BC Managed Care – PPO | Source: Home / Self Care

## 2019-06-23 ENCOUNTER — Telehealth (HOSPITAL_COMMUNITY): Payer: Self-pay | Admitting: Physician Assistant

## 2019-06-23 ENCOUNTER — Encounter (HOSPITAL_COMMUNITY): Payer: Self-pay | Admitting: Emergency Medicine

## 2019-06-23 DIAGNOSIS — N182 Chronic kidney disease, stage 2 (mild): Secondary | ICD-10-CM | POA: Diagnosis not present

## 2019-06-23 DIAGNOSIS — R42 Dizziness and giddiness: Secondary | ICD-10-CM | POA: Diagnosis present

## 2019-06-23 DIAGNOSIS — E860 Dehydration: Secondary | ICD-10-CM | POA: Diagnosis not present

## 2019-06-23 DIAGNOSIS — N289 Disorder of kidney and ureter, unspecified: Secondary | ICD-10-CM | POA: Insufficient documentation

## 2019-06-23 DIAGNOSIS — J45909 Unspecified asthma, uncomplicated: Secondary | ICD-10-CM | POA: Diagnosis not present

## 2019-06-23 DIAGNOSIS — I129 Hypertensive chronic kidney disease with stage 1 through stage 4 chronic kidney disease, or unspecified chronic kidney disease: Secondary | ICD-10-CM | POA: Diagnosis not present

## 2019-06-23 DIAGNOSIS — B349 Viral infection, unspecified: Secondary | ICD-10-CM | POA: Diagnosis not present

## 2019-06-23 DIAGNOSIS — R5383 Other fatigue: Secondary | ICD-10-CM | POA: Diagnosis not present

## 2019-06-23 DIAGNOSIS — E1122 Type 2 diabetes mellitus with diabetic chronic kidney disease: Secondary | ICD-10-CM | POA: Insufficient documentation

## 2019-06-23 DIAGNOSIS — Z8616 Personal history of COVID-19: Secondary | ICD-10-CM | POA: Diagnosis not present

## 2019-06-23 DIAGNOSIS — I2510 Atherosclerotic heart disease of native coronary artery without angina pectoris: Secondary | ICD-10-CM | POA: Diagnosis not present

## 2019-06-23 DIAGNOSIS — N179 Acute kidney failure, unspecified: Secondary | ICD-10-CM

## 2019-06-23 DIAGNOSIS — Z7984 Long term (current) use of oral hypoglycemic drugs: Secondary | ICD-10-CM | POA: Diagnosis not present

## 2019-06-23 DIAGNOSIS — Z7982 Long term (current) use of aspirin: Secondary | ICD-10-CM | POA: Insufficient documentation

## 2019-06-23 DIAGNOSIS — Z20822 Contact with and (suspected) exposure to covid-19: Secondary | ICD-10-CM | POA: Diagnosis not present

## 2019-06-23 DIAGNOSIS — R031 Nonspecific low blood-pressure reading: Secondary | ICD-10-CM

## 2019-06-23 DIAGNOSIS — Z79899 Other long term (current) drug therapy: Secondary | ICD-10-CM | POA: Diagnosis not present

## 2019-06-23 LAB — BASIC METABOLIC PANEL
Anion gap: 14 (ref 5–15)
Anion gap: 11 (ref 5–15)
BUN: 30 mg/dL — ABNORMAL HIGH (ref 8–23)
BUN: 33 mg/dL — ABNORMAL HIGH (ref 8–23)
CO2: 24 mmol/L (ref 22–32)
CO2: 25 mmol/L (ref 22–32)
Calcium: 9.4 mg/dL (ref 8.9–10.3)
Calcium: 9.1 mg/dL (ref 8.9–10.3)
Chloride: 102 mmol/L (ref 98–111)
Chloride: 103 mmol/L (ref 98–111)
Creatinine, Ser: 2.47 mg/dL — ABNORMAL HIGH (ref 0.61–1.24)
Creatinine, Ser: 2.47 mg/dL — ABNORMAL HIGH (ref 0.61–1.24)
GFR calc Af Amer: 31 mL/min — ABNORMAL LOW (ref >60)
GFR calc Af Amer: 31 mL/min — ABNORMAL LOW (ref >60)
GFR calc non Af Amer: 26 mL/min — ABNORMAL LOW (ref >60)
GFR calc non Af Amer: 26 mL/min — ABNORMAL LOW (ref >60)
Glucose, Bld: 151 mg/dL — ABNORMAL HIGH (ref 70–99)
Glucose, Bld: 220 mg/dL — ABNORMAL HIGH (ref 70–99)
Potassium: 4.3 mmol/L (ref 3.5–5.1)
Potassium: 4.1 mmol/L (ref 3.5–5.1)
Sodium: 140 mmol/L (ref 135–145)
Sodium: 139 mmol/L (ref 135–145)

## 2019-06-23 LAB — CBC WITH DIFFERENTIAL/PLATELET
Abs Immature Granulocytes: 0.09 10*3/uL — ABNORMAL HIGH (ref 0.00–0.07)
Basophils Absolute: 0.1 10*3/uL (ref 0.0–0.1)
Basophils Relative: 0 %
Eosinophils Absolute: 0.1 10*3/uL (ref 0.0–0.5)
Eosinophils Relative: 1 %
HCT: 38 % — ABNORMAL LOW (ref 39.0–52.0)
Hemoglobin: 12.7 g/dL — ABNORMAL LOW (ref 13.0–17.0)
Immature Granulocytes: 1 %
Lymphocytes Relative: 17 %
Lymphs Abs: 2.4 10*3/uL (ref 0.7–4.0)
MCH: 29.7 pg (ref 26.0–34.0)
MCHC: 33.4 g/dL (ref 30.0–36.0)
MCV: 89 fL (ref 80.0–100.0)
Monocytes Absolute: 1.6 10*3/uL — ABNORMAL HIGH (ref 0.1–1.0)
Monocytes Relative: 11 %
Neutro Abs: 10.2 10*3/uL — ABNORMAL HIGH (ref 1.7–7.7)
Neutrophils Relative %: 70 %
Platelets: 411 10*3/uL — ABNORMAL HIGH (ref 150–400)
RBC: 4.27 MIL/uL (ref 4.22–5.81)
RDW: 12.4 % (ref 11.5–15.5)
WBC: 14.5 10*3/uL — ABNORMAL HIGH (ref 4.0–10.5)
nRBC: 0 % (ref 0.0–0.2)

## 2019-06-23 LAB — CBC
HCT: 37.3 % — ABNORMAL LOW (ref 39.0–52.0)
Hemoglobin: 12.1 g/dL — ABNORMAL LOW (ref 13.0–17.0)
MCH: 29.5 pg (ref 26.0–34.0)
MCHC: 32.4 g/dL (ref 30.0–36.0)
MCV: 91 fL (ref 80.0–100.0)
Platelets: 361 10*3/uL (ref 150–400)
RBC: 4.1 MIL/uL — ABNORMAL LOW (ref 4.22–5.81)
RDW: 12.4 % (ref 11.5–15.5)
WBC: 12.9 10*3/uL — ABNORMAL HIGH (ref 4.0–10.5)
nRBC: 0 % (ref 0.0–0.2)

## 2019-06-23 LAB — URINALYSIS, ROUTINE W REFLEX MICROSCOPIC
Bilirubin Urine: NEGATIVE
Glucose, UA: NEGATIVE mg/dL
Hgb urine dipstick: NEGATIVE
Ketones, ur: NEGATIVE mg/dL
Leukocytes,Ua: NEGATIVE
Nitrite: NEGATIVE
Protein, ur: NEGATIVE mg/dL
Specific Gravity, Urine: 1.021 (ref 1.005–1.030)
pH: 5 (ref 5.0–8.0)

## 2019-06-23 LAB — POCT URINALYSIS DIP (DEVICE)
Glucose, UA: NEGATIVE mg/dL
Hgb urine dipstick: NEGATIVE
Leukocytes,Ua: NEGATIVE
Nitrite: NEGATIVE
Protein, ur: 30 mg/dL — AB
Specific Gravity, Urine: >=1.03 (ref 1.005–1.030)
Urobilinogen, UA: 1 mg/dL (ref 0.0–1.0)
pH: 5 (ref 5.0–8.0)

## 2019-06-23 MED ORDER — SODIUM CHLORIDE 0.9% FLUSH: 3.0000 mL | Freq: Once | INTRAVENOUS | Status: DC

## 2019-06-23 MED ORDER — SODIUM CHLORIDE 0.9 % IV BOLUS
500.0000 mL | Freq: Once | INTRAVENOUS | Status: AC
  Administered 2019-06-23: 500 mL via INTRAVENOUS

## 2019-06-23 NOTE — Discharge Instructions (Addendum)
We have given you some fluids and sent labs.  I will notify you of any findings requiring discussion.  Hold your blood pressure medicines, for 1 day.  Ensure you are drinking plenty of fluids.  If you feel lightheaded and have low blood pressure readings again you should report to the emergency department.  If you have chest pain, shortness of breath, high fever and generally feeling much worse he should also report to the emergency department  Please call your primary care for to schedule appointment next week for reevaluation.  If your Covid-19 test is positive, you will receive a phone call from [facility] regarding your results. Negative test results are not called. Both positive and negative results area always visible on MyChart. If you do not have a MyChart account, sign up instructions are in your discharge papers.   Persons who are directed to care for themselves at home may discontinue isolation under the following conditions:   At least 10 days have passed since symptom onset and  At least 24 hours have passed without running a fever (this means without the use of fever-reducing medications) and  Other symptoms have improved.  Persons infected with COVID-19 who never develop symptoms may discontinue isolation and other precautions 10 days after the date of their first positive COVID-19 test.

## 2019-06-23 NOTE — ED Triage Notes (Signed)
PT was in ER 5/25 for syncopal episode. Was fecal occult positive at that visit.   PT reports continued fatigue. Temp 100.2 today. He went in to get a tooth extracted today and was sent away due to sys BP in the 80s. PT ambulatory to room.

## 2019-06-23 NOTE — Telephone Encounter (Cosign Needed)
Patient called and notified of lab abnormalities, instructed to report to Emergency Department for further evaluation. He agrees.

## 2019-06-23 NOTE — ED Provider Notes (Addendum)
[place]    CSN: [id number] Arrival date & time: 06/23/19  1353      History   Chief Complaint Chief Complaint  Patient presents with  . Fatigue    HPI [name] is a 64 y.o. male.   Patient is a 64 year old with history of coronary artery disease, uncontrolled diabetes, heart failure with preserved ejection fraction and prostate cancer presenting for blood pressure lightheaded feeling earlier.  He reports he was eating a tooth extracted when they noted his blood pressure was in the [id number] systolic and they recommended he be further evaluated.  Reports on his way home when he was walking up to his house started to feel little wobbly and caught himself.  He reports he went and laid down and the sensation went away.  He reports since then has just been acting fatigued and not so great.  He denies fever and chills.  Denies cough, shortness of breath, chest pain.  Denies painful urination, frequency or urgency.  Denies abdominal pain .  Patient denies headache, vision change, dizziness.  Patient was recently seen on 06/21/2019 in the emergency department for a syncopal episode.  Per chart review he was having a bowel movement when he stood up he passed out for a few seconds.  EMS was called and he was taken to the emergency department.  Was found to have bradycardia and low blood pressure.  Patient had work-up in the emergency department and diagnosed with vasovagal syncope.  He was also found to have guaiac positive rectal exam.  Patient did have bloody stool leading up to that.  He was discharged with close follow-up precautions and instructions to hydrate well.[name]    He denies continued blood in his stool or a dark tarry appearance, however endorses diarrhea since 06/21/2019. Endorses atleast 2 episodes of liquid like diarrhea daily. No nausea or vomiting.   Patient had chest x-ray which was negative for acute pathology.  EKG showed unchanged since previous.  He did have an  elevated white count of 15.8 however this was thought to be secondary to stress reaction.  He did have a mild increase in his creatinine and BUN from previous.  Patient had Covid in January 2021, during hospital admission.     Past Medical History:  Diagnosis Date  . Asthma   . CAD (coronary artery disease)    a. non obst dz by cath 2004  b. 11/12/15 which showed no obvious large vessel CAD. No PCI or clear reason for NSTEMI ( possibly hypertensive urgency?).   [name] CARDIOMYOPATHY    a. NICM, LVEF 30% 01/2011 echo; 20-25% 07/2012 echo, EF recovered by echo 06/2015.  . Congestive heart failure (CHF) ([place])   . DIABETES MELLITUS, TYPE II   . HYPERCHOLESTEROLEMIA   . HYPERTENSION   . Myocardial infarct ([place]) 10/2015  . Noncompliance   . Prostate cancer [facility])    prostate cancer  . PVCs (premature ventricular contractions)    a. s/p ablation 01-07-2013 by Dr Rayann Heman    Patient Active Problem List   Diagnosis Date Noted  . COVID-19 virus infection 02/07/2019  . Diarrhea 02/07/2019  . Acute on chronic renal failure ([place]) 02/06/2019  . Routine general medical examination at a health care facility 03/12/2018  . Balanitis 10/15/2016  . NSTEMI (non-ST elevated myocardial infarction) ([place]) 11/13/2015  . PVCs (premature ventricular contractions)   . CAD (coronary artery disease)   . Acute kidney injury ([place]) 07/19/2015  . AKI (acute kidney injury) (  [place]) 07/19/2015  . Hyperglycemia 07/12/2015  . Prostate cancer ([place]) 02/22/2014  . Cholelithiasis and cholecystitis without obstruction 11/16/2013  . PVC's (premature ventricular contractions) 01/07/2013  . CKD (chronic kidney disease) stage 2, GFR 60-89 ml/min 08/25/2012  . OSA (obstructive sleep apnea) 08/25/2012  . Chronic systolic heart failure ([place]) 08/06/2012  . HTN (hypertension), malignant 02/12/2011  . Non compliance w medication regimen 02/12/2011  . HYPERCHOLESTEROLEMIA 09/17/2009  . PREMATURE VENTRICULAR CONTRACTIONS 09/17/2009  .  PSA, INCREASED 04/26/2009  . Diabetes type 2, uncontrolled ([place]) 04/25/2009  . CARDIOMYOPATHY 04/25/2009    Past Surgical History:  Procedure Laterality Date  . ABLATION  01-07-2013   RVOT PVC's ablated by Dr Rayann Heman along anteroseptal RVOT  . CARDIAC CATHETERIZATION  2004   nonobst dz  . CARDIAC CATHETERIZATION N/A 11/12/2015   Procedure: Left Heart Cath and Coronary Angiography;  Surgeon: Sherren Mocha, MD;  Location: [facility];  Service: Cardiovascular;  Laterality: N/A;  . CHOLECYSTECTOMY N/A 11/16/2013   Procedure: LAPAROSCOPIC CHOLECYSTECTOMY WITH INTRAOPERATIVE CHOLANGIOGRAM;  Surgeon: [name], MD;  Location: [facility];  Service: General;  Laterality: N/A;  . LYMPHADENECTOMY Bilateral 02/22/2014   Procedure: PELVIC LYMPH NODE DISSECTION;  Surgeon: [name], MD;  Location: [facility];  Service: Urology;  Laterality: Bilateral;  . PROSTATE SURGERY    . ROBOT ASSISTED LAPAROSCOPIC RADICAL PROSTATECTOMY N/A 02/22/2014   Procedure: ROBOTIC ASSISTED LAPAROSCOPIC RADICAL PROSTATECTOMY WITH INDOCYANINE GREEN DYE AND OPEN UMBILICAL HERNIA REPAIR;  Surgeon: [name], MD;  Location: [facility];  Service: Urology;  Laterality: N/A;  . SUPRAVENTRICULAR TACHYCARDIA ABLATION N/A 09/28/2012   Procedure: Tanna Furry Ablation;  Surgeon: [name], MD;  Location: [facility] CATH LAB;  Service: Cardiovascular;  Laterality: N/A;  . SURGERY SCROTAL / TESTICULAR     at age 41  . V-TACH ABLATION N/A 01/07/2013   Procedure: V-TACH ABLATION;  Surgeon: [name], MD;  Location: [facility] CATH LAB;  Service: Cardiovascular;  Laterality: N/A;       Home Medications    Prior to Admission medications   Medication Sig Start Date End Date Taking? Authorizing Provider  aspirin EC 81 MG EC tablet Take 1 tablet (81 mg total) by mouth daily. 11/14/15   Eileen Stanford, PA-C  atorvastatin (LIPITOR) 80 MG tablet Take 1 tablet by mouth once daily Patient taking differently: Take 80 mg by mouth daily.   05/24/19   Marrian Salvage, FNP  carvedilol (COREG) 25 MG tablet TAKE 1 TABLET BY MOUTH TWICE DAILY WITH A MEAL Patient taking differently: Take 25 mg by mouth 2 (two) times daily with a meal.  04/19/19   Isaiah Serge, NP  clopidogrel (PLAVIX) 75 MG tablet Take 1 tablet (75 mg total) by mouth daily. 06/03/19   Jerline Pain, MD  hydrochlorothiazide (HYDRODIURIL) 25 MG tablet Take 25 mg by mouth daily. 06/08/19   [provider]  insulin glargine (LANTUS SOLOSTAR) 100 UNIT/ML Solostar Pen Inject 55 Units into the skin every morning. And pen needles 1/day 06/02/19   Renato Shin, MD  isosorbide mononitrate (IMDUR) 60 MG 24 hr tablet TAKE 1 & 1/2 (ONE & ONE-HALF) TABLETS BY MOUTH ONCE DAILY Patient taking differently: Take 90 mg by mouth daily.  12/17/18   [name], NP  lisinopril (ZESTRIL) 40 MG tablet Take 40 mg by mouth daily. 06/08/19   [provider]  Multiple Vitamin (MULTIVITAMIN WITH MINERALS) TABS tablet Take 1 tablet by mouth daily. 02/10/19   Hongalgi, Lenis Dickinson, MD  Family History Family History  Problem Relation Age of Onset  . Hypertension Mother   . Peripheral vascular disease Mother   . Diabetes Mother   . Peripheral vascular disease Other   . Hypertension Other   . Prostate cancer Other   . Prostate cancer Brother   . Lung cancer Brother   . Heart attack Paternal Grandfather   . Heart failure Maternal Grandmother   . Diabetes Son   . Colon cancer Sister   . Prostate cancer Brother   . Prostate cancer Brother   . Prostate cancer Brother   . Pancreatic cancer Paternal Aunt   . Esophageal cancer Neg Hx     Social History Social History   Tobacco Use  . Smoking status: Never Smoker  . Smokeless tobacco: Never Used  . 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 Alcohol use: Yes    Comment: rare  . Drug use: No     Allergies   Ambien [zolpidem tartrate] and Metformin and  related   Review of Systems Review of Systems   Physical Exam Triage Vital Signs ED Triage Vitals  Enc Vitals Group     BP 06/23/19 1454 126/78     Pulse Rate 06/23/19 1454 91     Resp 06/23/19 1454 (!) 22     Temp 06/23/19 1454 100.2 F (37.9 C)     Temp Source 06/23/19 1454 Oral     SpO2 06/23/19 1454 94 %     Weight --      Height --      Head Circumference --      Peak Flow --      Pain Score 06/23/19 1456 4     Pain Loc --      Pain Edu? --      Excl. in [place]? --    Orthostatic VS for the past 24 hrs:  BP- Lying Pulse- Lying BP- Sitting Pulse- Sitting BP- Standing at 0 minutes Pulse- Standing at 0 minutes  06/23/19 1541 127/80 88 128/88 96 111/75 98    Updated Vital Signs BP 126/78   Pulse 91   Temp 98.2 F (36.8 C) (Oral)   Resp (!) 22   SpO2 94%   Visual Acuity Right Eye Distance:   Left Eye Distance:   Bilateral Distance:    Right Eye Near:   Left Eye Near:    Bilateral Near:     Physical Exam Vitals and nursing note reviewed.  Constitutional:      General: He is not in acute distress.    Appearance: Normal appearance. He is well-developed. He is not ill-appearing.     Comments: Appears fatigued however not toxic  HENT:     Head: Normocephalic and atraumatic.     Nose: Nose normal.     Mouth/Throat:     Mouth: Mucous membranes are moist.     Pharynx: Oropharynx is clear.  Eyes:     Conjunctiva/sclera: Conjunctivae normal.     Pupils: Pupils are equal, round, and reactive to light.  Cardiovascular:     Rate and Rhythm: Normal rate and regular rhythm.     Heart sounds: No murmur.  Pulmonary:     Effort: Pulmonary effort is normal. No respiratory distress.     Breath sounds: Normal breath sounds. No wheezing, rhonchi or rales.  Abdominal:     Palpations: Abdomen is soft.     Tenderness: There is no  abdominal tenderness. There is no right CVA tenderness or left CVA tenderness.  Musculoskeletal:     Cervical back: Neck supple.     Right  lower leg: No edema.     Left lower leg: No edema.  Skin:    General: Skin is warm and dry.     Capillary Refill: Capillary refill takes less than 2 seconds.  Neurological:     General: No focal deficit present.     Mental Status: He is alert and oriented to person, place, and time.     Cranial Nerves: No cranial nerve deficit.     Sensory: No sensory deficit.     Motor: No weakness.     Coordination: Coordination normal.  Psychiatric:        Mood and Affect: Mood normal.        Behavior: Behavior normal.        Thought Content: Thought content normal.        Judgment: Judgment normal.      UC Treatments / Results  Labs (all labs ordered are listed, but only abnormal results are displayed) Labs Reviewed  BASIC METABOLIC PANEL - Abnormal; Notable for the following components:      Result Value   Glucose, Bld 151 (*)    BUN 30 (*)    Creatinine, Ser 2.47 (*)    GFR calc non Af Amer 26 (*)    GFR calc Af Amer 31 (*)    All other components within normal limits  CBC WITH DIFFERENTIAL/PLATELET - Abnormal; Notable for the following components:   WBC 14.5 (*)    Hemoglobin 12.7 (*)    HCT 38.0 (*)    Platelets 411 (*)    Neutro Abs 10.2 (*)    Monocytes Absolute 1.6 (*)    Abs Immature Granulocytes 0.09 (*)    All other components within normal limits  POCT URINALYSIS DIP (DEVICE) - Abnormal; Notable for the following components:   Bilirubin Urine SMALL (*)    Ketones, ur TRACE (*)    Protein, ur 30 (*)    All other components within normal limits  URINE CULTURE  SARS CORONAVIRUS 2 (TAT 6-24 HRS)    EKG   Radiology No results found.  Procedures Procedures (including critical care time)  Medications Ordered in UC Medications  sodium chloride 0.9 % bolus 500 mL (500 mLs Intravenous New Bag/Given 06/23/19 1601)    Initial Impression / Assessment and Plan / UC Course  I have reviewed the triage vital signs and the nursing notes.  Pertinent labs & imaging  results that were available during my care of the patient were reviewed by me and considered in my medical decision making (see chart for details).     #AKI #Viral illness #Low blood pressure readings #Dehydration #Fatigue Patient is a 64 year old with history of CAD, CHF with preserved ejection fraction, prostate cancer and recent syncopal episode with rectal bleeding presenting to urgent care fatigue and dehydration picture.  Blood pressure normalized here in clinic and is nontachycardic.  Mildly elevated temperature initially however corrects to 98.2 on recheck.  Orthostatics are negative.  Slightly elevated respiratory rate however lung exam normal in x-ray from 06/21/2019 without pathology.  Urine negative for infection, but shows a dehydration picture.  He did have elevation in creatinine at 1.59 on 5/25.,Will repeat CBC and BMP. Patient felt safe for discharge with labs pending, stable vital signs and reported improvement following IV fluid. Was discharged with hydration instructions and close  PCP follow up. - CBC and BMP return post discharge,  with Creatinine bump to 2.47 and BUN to 30, showing worsening AKI. WBC slightly improved and HGB stable. Appears volume contracted however, likely hgb is lower.  -  Called patient and discussed this finding and recommended he report to the emergency department for further management tonight for his kidney injury and worsening kidney functions.  He reports he will report to the emergency department tonight for further evaluation. - Suspect viral illness, and given continued rise in Creatinine, concern for continued GI bleed with recent guiac positive and rectal bleeding -Patient received 500 mL normal saline in urgent care -Urine culture was sent and is pending. -Covid PCR was also sent and is pending Final Clinical Impressions(s) / UC Diagnoses   Final diagnoses:  Fatigue, unspecified type  Dehydration  Viral illness  AKI (acute kidney injury)  ([place])  Low blood pressure reading     Discharge Instructions     We have given you some fluids and sent labs.  I will notify you of any findings requiring discussion.  Hold your blood pressure medicines, for 1 day.  Ensure you are drinking plenty of fluids.  If you feel lightheaded and have low blood pressure readings again you should report to the emergency department.  If you have chest pain, shortness of breath, high fever and generally feeling much worse he should also report to the emergency department  Please call your primary care for to schedule appointment next week for reevaluation.  If your Covid-19 test is positive, you will receive a phone call from [facility] regarding your results. Negative test results are not called. Both positive and negative results area always visible on MyChart. If you do not have a MyChart account, sign up instructions are in your discharge papers.   Persons who are directed to care for themselves at home may discontinue isolation under the following conditions:  . At least 10 days have passed since symptom onset and . At least 24 hours have passed without running a fever (this means without the use of fever-reducing medications) and . Other symptoms have improved.  Persons infected with COVID-19 who never develop symptoms may discontinue isolation and other precautions 10 days after the date of their first positive COVID-19 test.       ED Prescriptions    None     PDMP not reviewed this encounter.   Purnell Shoemaker, PA-C 06/23/19 1815    Nesta Kimple, Marguerita Beards, PA-C 06/23/19 1816    Caterine Mcmeans, Marguerita Beards, PA-C 06/23/19 2301

## 2019-06-23 NOTE — ED Triage Notes (Addendum)
Patient arrives to ED with complaints of an abnormal lab taken today at [facility]. Patient states his kidney levels are elevated and was positive for acute blood loss through rectum. Patient was scheduled for tooth extraction today and was found to be hypotensive and lightheaded during and after procedure. Patient has been dizzy since Monday.

## 2019-06-24 LAB — BASIC METABOLIC PANEL
Anion gap: 9 (ref 5–15)
BUN: 33 mg/dL — ABNORMAL HIGH (ref 8–23)
CO2: 23 mmol/L (ref 22–32)
Calcium: 8.8 mg/dL — ABNORMAL LOW (ref 8.9–10.3)
Chloride: 107 mmol/L (ref 98–111)
Creatinine, Ser: 2.11 mg/dL — ABNORMAL HIGH (ref 0.61–1.24)
GFR calc Af Amer: 37 mL/min — ABNORMAL LOW (ref >60)
GFR calc non Af Amer: 32 mL/min — ABNORMAL LOW (ref >60)
Glucose, Bld: 106 mg/dL — ABNORMAL HIGH (ref 70–99)
Potassium: 3.7 mmol/L (ref 3.5–5.1)
Sodium: 139 mmol/L (ref 135–145)

## 2019-06-24 LAB — CBG MONITORING, ED: Glucose-Capillary: 100 mg/dL — ABNORMAL HIGH (ref 70–99)

## 2019-06-24 LAB — I-STAT CHEM 8, ED
BUN: 32 mg/dL — ABNORMAL HIGH (ref 8–23)
Calcium, Ion: 1.13 mmol/L — ABNORMAL LOW (ref 1.15–1.40)
Chloride: 109 mmol/L (ref 98–111)
Creatinine, Ser: 2.2 mg/dL — ABNORMAL HIGH (ref 0.61–1.24)
Glucose, Bld: 101 mg/dL — ABNORMAL HIGH (ref 70–99)
HCT: 34 % — ABNORMAL LOW (ref 39.0–52.0)
Hemoglobin: 11.6 g/dL — ABNORMAL LOW (ref 13.0–17.0)
Potassium: 3.7 mmol/L (ref 3.5–5.1)
Sodium: 141 mmol/L (ref 135–145)
TCO2: 23 mmol/L (ref 22–32)

## 2019-06-24 LAB — SARS CORONAVIRUS 2 (TAT 6-24 HRS): SARS Coronavirus 2: NEGATIVE

## 2019-06-24 MED ORDER — SODIUM CHLORIDE 0.9 % IV BOLUS
1000.0000 mL | Freq: Once | INTRAVENOUS | Status: AC
  Administered 2019-06-24: 1000 mL via INTRAVENOUS

## 2019-06-24 MED ORDER — SODIUM CHLORIDE 0.9 % IV BOLUS
500.0000 mL | Freq: Once | INTRAVENOUS | Status: AC
  Administered 2019-06-24: 500 mL via INTRAVENOUS

## 2019-06-24 NOTE — ED Provider Notes (Addendum)
[name] Provider Note   CSN: [id number] Arrival date & time: 06/23/19  1847     History Chief Complaint  Patient presents with  . Abnormal Lab  . Dizziness    [name] is a 64 y.o. male.  The history is provided by the patient.  Abnormal Lab Time since result:  14 hours  Referred by: urgent  Resulting agency:  External Resulting agency details:  2.5  Result type: chemistry   Result type comment:  Creatinine  Chemistry:    Creatinine:  High   Other abnormal chemistry result:  None  Patient with h/o renal insufficiency presents sent in from urgent care for elevated creatinine and lightheadedness.  No CP, no SOB.  No weakness, no numbness, no facial asymmetry.  Has not been drinking much water.       Past Medical History:  Diagnosis Date  . Asthma   . CAD (coronary artery disease)    a. non obst dz by cath 2004  b. 11/12/15 which showed no obvious large vessel CAD. No PCI or clear reason for NSTEMI ( possibly hypertensive urgency?).   [name] CARDIOMYOPATHY    a. NICM, LVEF 30% 01/2011 echo; 20-25% 07/2012 echo, EF recovered by echo 06/2015.  . Congestive heart failure (CHF) [place]   . DIABETES MELLITUS, TYPE II   . HYPERCHOLESTEROLEMIA   . HYPERTENSION   . Myocardial infarct ([place]) 10/2015  . Noncompliance   . Prostate cancer [facility])    prostate cancer  . PVCs (premature ventricular contractions)    a. s/p ablation 01-07-2013 by Dr Rayann Heman    Patient Active Problem List   Diagnosis Date Noted  . COVID-19 virus infection 02/07/2019  . Diarrhea 02/07/2019  . Acute on chronic renal failure ([place]) 02/06/2019  . Routine general medical examination at a health care facility 03/12/2018  . Balanitis 10/15/2016  . NSTEMI (non-ST elevated myocardial infarction) ([place]) 11/13/2015  . PVCs (premature ventricular contractions)   . CAD (coronary artery disease)   . Acute kidney injury ([place]) 07/19/2015  . AKI (acute kidney injury) ([place])  07/19/2015  . Hyperglycemia 07/12/2015  . Prostate cancer ([place]) 02/22/2014  . Cholelithiasis and cholecystitis without obstruction 11/16/2013  . PVC's (premature ventricular contractions) 01/07/2013  . CKD (chronic kidney disease) stage 2, GFR 60-89 ml/min 08/25/2012  . OSA (obstructive sleep apnea) 08/25/2012  . Chronic systolic heart failure ([place]) 08/06/2012  . HTN (hypertension), malignant 02/12/2011  . Non compliance w medication regimen 02/12/2011  . HYPERCHOLESTEROLEMIA 09/17/2009  . PREMATURE VENTRICULAR CONTRACTIONS 09/17/2009  . PSA, INCREASED 04/26/2009  . Diabetes type 2, uncontrolled ([place]) 04/25/2009  . CARDIOMYOPATHY 04/25/2009    Past Surgical History:  Procedure Laterality Date  . ABLATION  01-07-2013   RVOT PVC's ablated by Dr Rayann Heman along anteroseptal RVOT  . CARDIAC CATHETERIZATION  2004   nonobst dz  . CARDIAC CATHETERIZATION N/A 11/12/2015   Procedure: Left Heart Cath and Coronary Angiography;  Surgeon: Sherren Mocha, MD;  Location: [place] CV LAB;  Service: Cardiovascular;  Laterality: N/A;  . CHOLECYSTECTOMY N/A 11/16/2013   Procedure: LAPAROSCOPIC CHOLECYSTECTOMY WITH INTRAOPERATIVE CHOLANGIOGRAM;  Surgeon: [name], MD;  Location: [facility] ORS;  Service: General;  Laterality: N/A;  . LYMPHADENECTOMY Bilateral 02/22/2014   Procedure: PELVIC LYMPH NODE DISSECTION;  Surgeon: [name], MD;  Location: [facility] OR[facility];  Service: Urology;  Laterality: Bilateral;  . PROSTATE SURGERY    . ROBOT ASSISTED LAPAROSCOPIC RADICAL PROSTATECTOMY N/A 02/22/2014   Procedure: ROBOTIC ASSISTED LAPAROSCOPIC RADICAL  PROSTATECTOMY WITH INDOCYANINE GREEN DYE AND OPEN UMBILICAL HERNIA REPAIR;  Surgeon: [name], MD;  Location: [facility] OR[facility];  Service: Urology;  Laterality: N/A;  . SUPRAVENTRICULAR TACHYCARDIA ABLATION N/A 09/28/2012   Procedure: Tanna Furry Ablation;  Surgeon: [name], MD;  Location: [facility] CATH LAB;  Service: Cardiovascular;  Laterality: N/A;  . SURGERY SCROTAL /  TESTICULAR     at age 38  . V-TACH ABLATION N/A 01/07/2013   Procedure: V-TACH ABLATION;  Surgeon: [name], MD;  Location: [place] CATH LAB;  Service: Cardiovascular;  Laterality: N/A;       Family History  Problem Relation Age of Onset  . Hypertension Mother   . Peripheral vascular disease Mother   . Diabetes Mother   . Peripheral vascular disease Other   . Hypertension Other   . Prostate cancer Other   . Prostate cancer Brother   . Lung cancer Brother   . Heart attack Paternal Grandfather   . Heart failure Maternal Grandmother   . Diabetes Son   . Colon cancer Sister   . Prostate cancer Brother   . Prostate cancer Brother   . Prostate cancer Brother   . Pancreatic cancer Paternal Aunt   . Esophageal cancer Neg Hx     Social History   Tobacco Use  . Smoking status: Never Smoker  . Smokeless tobacco: Never Used  . 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 Alcohol use: Yes    Comment: rare  . Drug use: No    Home Medications Prior to Admission medications   Medication Sig Start Date End Date Taking? Authorizing Provider  aspirin EC 81 MG EC tablet Take 1 tablet (81 mg total) by mouth daily. 11/14/15   Eileen Stanford, PA-C  atorvastatin (LIPITOR) 80 MG tablet Take 1 tablet by mouth once daily Patient taking differently: Take 80 mg by mouth daily.  05/24/19   Marrian Salvage, FNP  carvedilol (COREG) 25 MG tablet TAKE 1 TABLET BY MOUTH TWICE DAILY WITH A MEAL Patient taking differently: Take 25 mg by mouth 2 (two) times daily with a meal.  04/19/19   Isaiah Serge, NP  clopidogrel (PLAVIX) 75 MG tablet Take 1 tablet (75 mg total) by mouth daily. 06/03/19   Jerline Pain, MD  hydrochlorothiazide (HYDRODIURIL) 25 MG tablet Take 25 mg by mouth daily. 06/08/19   [provider]  insulin glargine (LANTUS SOLOSTAR) 100 UNIT/ML Solostar Pen Inject 55 Units into the skin every morning. And pen needles  1/day 06/02/19   Renato Shin, MD  isosorbide mononitrate (IMDUR) 60 MG 24 hr tablet TAKE 1 & 1/2 (ONE & ONE-HALF) TABLETS BY MOUTH ONCE DAILY Patient taking differently: Take 90 mg by mouth daily.  12/17/18   [name], NP  lisinopril (ZESTRIL) 40 MG tablet Take 40 mg by mouth daily. 06/08/19   [provider]  Multiple Vitamin (MULTIVITAMIN WITH MINERALS) TABS tablet Take 1 tablet by mouth daily. 02/10/19   Hongalgi, Lenis Dickinson, MD    Allergies    Ambien [zolpidem tartrate] and Metformin and related  Review of Systems   Review of Systems  Constitutional: Negative for diaphoresis, fatigue and fever.  HENT: Negative for congestion.   Eyes: Negative for visual disturbance.  Respiratory: Negative for shortness of breath.   Cardiovascular: Negative for chest pain, palpitations and leg swelling.  Gastrointestinal: Negative for abdominal pain.  Genitourinary: Negative for difficulty urinating.  Musculoskeletal: Negative for  arthralgias.  Neurological: Positive for light-headedness. Negative for dizziness, tremors, seizures, syncope, facial asymmetry, speech difficulty, weakness, numbness and headaches.  Psychiatric/Behavioral: Negative for agitation.  All other systems reviewed and are negative.   Physical Exam Updated Vital Signs BP (!) 148/74 (BP Location: Right Arm)   Pulse 77   Temp 98.3 F (36.8 C) (Oral)   Resp 18   Ht 5\' 5"  (1.651 m)   Wt 81.6 kg   SpO2 100%   BMI 29.95 kg/m   Physical Exam Vitals and nursing note reviewed.  Constitutional:      General: He is not in acute distress.    Appearance: Normal appearance.  HENT:     Head: Normocephalic and atraumatic.     Nose: Nose normal.  Eyes:     Conjunctiva/sclera: Conjunctivae normal.     Pupils: Pupils are equal, round, and reactive to light.  Cardiovascular:     Rate and Rhythm: Normal rate and regular rhythm.     Pulses: Normal pulses.     Heart sounds: Normal heart sounds.  Pulmonary:     Effort:  Pulmonary effort is normal.     Breath sounds: Normal breath sounds.  Abdominal:     General: Abdomen is flat. Bowel sounds are normal.     Tenderness: There is no abdominal tenderness. There is no guarding.  Musculoskeletal:        General: Normal range of motion.     Cervical back: Normal range of motion and neck supple.  Skin:    General: Skin is warm and dry.     Capillary Refill: Capillary refill takes less than 2 seconds.  Neurological:     General: No focal deficit present.     Mental Status: He is alert and oriented to person, place, and time.     Deep Tendon Reflexes: Reflexes normal.  Psychiatric:        Mood and Affect: Mood normal.        Behavior: Behavior normal.     ED Results / Procedures / Treatments   Labs (all labs ordered are listed, but only abnormal results are displayed) Results for orders placed or performed during the hospital encounter of 0000000  Basic metabolic panel  Result Value Ref Range   Sodium 139 135 - 145 mmol/L   Potassium 4.1 3.5 - 5.1 mmol/L   Chloride 103 98 - 111 mmol/L   CO2 25 22 - 32 mmol/L   Glucose, Bld 220 (H) 70 - 99 mg/dL   BUN 33 (H) 8 - 23 mg/dL   Creatinine, Ser 2.47 (H) 0.61 - 1.24 mg/dL   Calcium 9.1 8.9 - 10.3 mg/dL   GFR calc non Af Amer 26 (L) >60 mL/min   GFR calc Af Amer 31 (L) >60 mL/min   Anion gap 11 5 - 15  CBC  Result Value Ref Range   WBC 12.9 (H) 4.0 - 10.5 K/uL   RBC 4.10 (L) 4.22 - 5.81 MIL/uL   Hemoglobin 12.1 (L) 13.0 - 17.0 g/dL   HCT 37.3 (L) 39.0 - 52.0 %   MCV 91.0 80.0 - 100.0 fL   MCH 29.5 26.0 - 34.0 pg   MCHC 32.4 30.0 - 36.0 g/dL   RDW 12.4 11.5 - 15.5 %   Platelets 361 150 - 400 K/uL   nRBC 0.0 0.0 - 0.2 %  Urinalysis, Routine w reflex microscopic  Result Value Ref Range   Color, Urine YELLOW YELLOW   APPearance HAZY (A) CLEAR  Specific Gravity, Urine 1.021 1.005 - 1.030   pH 5.0 5.0 - 8.0   Glucose, UA NEGATIVE NEGATIVE mg/dL   Hgb urine dipstick NEGATIVE NEGATIVE   Bilirubin  Urine NEGATIVE NEGATIVE   Ketones, ur NEGATIVE NEGATIVE mg/dL   Protein, ur NEGATIVE NEGATIVE mg/dL   Nitrite NEGATIVE NEGATIVE   Leukocytes,Ua NEGATIVE NEGATIVE  CBG monitoring, ED  Result Value Ref Range   Glucose-Capillary 100 (H) 70 - 99 mg/dL  I-stat chem 8, ED (not at [facility] or [facility]  Result Value Ref Range   Sodium 139 135 - 145 mmol/L   Potassium 6.5 (HH) 3.5 - 5.1 mmol/L   Chloride 109 98 - 111 mmol/L   BUN 50 (H) 8 - 23 mg/dL   Creatinine, Ser 2.10 (H) 0.61 - 1.24 mg/dL   Glucose, Bld 101 (H) 70 - 99 mg/dL   Calcium, Ion 1.09 (L) 1.15 - 1.40 mmol/L   TCO2 25 22 - 32 mmol/L   Hemoglobin 10.5 (L) 13.0 - 17.0 g/dL   HCT 31.0 (L) 39.0 - 52.0 %  I-stat chem 8, ed  Result Value Ref Range   Sodium 141 135 - 145 mmol/L   Potassium 3.7 3.5 - 5.1 mmol/L   Chloride 109 98 - 111 mmol/L   BUN 32 (H) 8 - 23 mg/dL   Creatinine, Ser 2.20 (H) 0.61 - 1.24 mg/dL   Glucose, Bld 101 (H) 70 - 99 mg/dL   Calcium, Ion 1.13 (L) 1.15 - 1.40 mmol/L   TCO2 23 22 - 32 mmol/L   Hemoglobin 11.6 (L) 13.0 - 17.0 g/dL   HCT 34.0 (L) 39.0 - 52.0 %   DG Chest 2 View  Result Date: 06/21/2019 CLINICAL DATA:  Syncope EXAM: CHEST - 2 VIEW COMPARISON:  February 06, 2019 FINDINGS: There is slight scarring in the left base. Lungs elsewhere clear. Heart size and pulmonary vascularity are normal. No adenopathy. No pneumothorax. No bone lesions. IMPRESSION: Slight scarring left base. Lungs otherwise clear. Cardiac silhouette within normal limits. Electronically Signed   By: Lowella Grip III M.D.   On: 06/21/2019 12:01    Radiology No results found.  Procedures Procedures (including critical care time)  Medications Ordered in ED Medications  sodium chloride flush (NS) 0.9 % injection 3 mL (has no administration in time range)  sodium chloride 0.9 % bolus 500 mL (has no administration in time range)  sodium chloride 0.9 % bolus 1,000 mL (0 mLs Intravenous Stopped 06/24/19 [id number])    ED Course  I have  reviewed the triage vital signs and the nursing notes.  Pertinent labs & imaging results that were available during my care of the patient were reviewed by me and considered in my medical decision making (see chart for details).    Hydrated in the Ed with slight improvement of kidney function.  Has a h/o of creatinine being this high in the past, he has known stage 3 kidney disease but creatinine had improved at the end of January.  Patient was hydrated and is not light headed he was instructed to follow up with his PMD and [facility] for ongoing testing and treatment.  Patient verbalizes understanding and agrees to follow up.   [name] was evaluated in [name] on 06/24/2019 for the symptoms described in the history of present illness. He was evaluated in the context of the global COVID-19 pandemic, which necessitated consideration that the patient might be at risk for infection with the SARS-CoV-2 virus that causes COVID-19. Institutional  protocols and algorithms that pertain to the evaluation of patients at risk for COVID-19 are in a state of rapid change based on information released by regulatory bodies including the CDC and federal and state organizations. These policies and algorithms were followed during the patient's care in the ED.  Final Clinical Impression(s) / ED DiagnosesReturn for intractable cough, coughing up blood,fevers >100.4 unrelieved by medication, shortness of breath, intractable vomiting, chest pain, shortness of breath, weakness,numbness, changes in speech, facial asymmetry,abdominal pain, passing out,Inability to tolerate liquids or food, cough, altered mental status or any concerns. No signs of systemic illness or infection. The patient is nontoxic-appearing on exam and vital signs are within normal limits.   I have reviewed the triage vital signs and the nursing notes. Pertinent labs &imaging results that were available during my care of  the patient were reviewed by me and considered in my medical decision making (see chart for details).After history, exam, and medical workup I feel the patient has beenappropriately medically screened and is safe for discharge home. Pertinent diagnoses were discussed with the patient. Patient was given return precautions.   Janese Radabaugh, MD 06/24/19 [place], Maliki Gignac, MD 06/24/19 [id number]

## 2019-06-24 NOTE — ED Notes (Signed)
Pt verbalized understanding of discharge instructions. Follow up care reviewed, pt had no further questions. 

## 2019-06-25 LAB — URINE CULTURE: Culture: NO GROWTH

## 2019-06-30 LAB — I-STAT CHEM 8, ED
BUN: 50 mg/dL — ABNORMAL HIGH (ref 8–23)
Calcium, Ion: 1.09 mmol/L — ABNORMAL LOW (ref 1.15–1.40)
Chloride: 109 mmol/L (ref 98–111)
Creatinine, Ser: 2.1 mg/dL — ABNORMAL HIGH (ref 0.61–1.24)
Glucose, Bld: 101 mg/dL — ABNORMAL HIGH (ref 70–99)
HCT: 31 % — ABNORMAL LOW (ref 39.0–52.0)
Hemoglobin: 10.5 g/dL — ABNORMAL LOW (ref 13.0–17.0)
Potassium: 6.5 mmol/L (ref 3.5–5.1)
Sodium: 139 mmol/L (ref 135–145)
TCO2: 25 mmol/L (ref 22–32)

## 2019-08-04 ENCOUNTER — Ambulatory Visit: Payer: BC Managed Care – PPO | Admitting: Endocrinology

## 2019-11-28 ENCOUNTER — Other Ambulatory Visit: Payer: Self-pay | Admitting: Family

## 2019-12-07 ENCOUNTER — Other Ambulatory Visit: Payer: Self-pay | Admitting: Cardiology

## 2019-12-27 ENCOUNTER — Other Ambulatory Visit: Payer: Self-pay | Admitting: Nurse Practitioner

## 2019-12-31 ENCOUNTER — Encounter (HOSPITAL_BASED_OUTPATIENT_CLINIC_OR_DEPARTMENT_OTHER): Payer: Self-pay | Admitting: Emergency Medicine

## 2019-12-31 ENCOUNTER — Emergency Department (HOSPITAL_BASED_OUTPATIENT_CLINIC_OR_DEPARTMENT_OTHER)
Admission: EM | Admit: 2019-12-31 | Discharge: 2019-12-31 | Disposition: A | Discharge status: Home / Self Care | Payer: BC Managed Care – PPO | Attending: Emergency Medicine | Admitting: Emergency Medicine

## 2019-12-31 ENCOUNTER — Other Ambulatory Visit: Payer: Self-pay

## 2019-12-31 DIAGNOSIS — I2510 Atherosclerotic heart disease of native coronary artery without angina pectoris: Secondary | ICD-10-CM | POA: Insufficient documentation

## 2019-12-31 DIAGNOSIS — E1122 Type 2 diabetes mellitus with diabetic chronic kidney disease: Secondary | ICD-10-CM | POA: Insufficient documentation

## 2019-12-31 DIAGNOSIS — Z8616 Personal history of COVID-19: Secondary | ICD-10-CM | POA: Diagnosis not present

## 2019-12-31 DIAGNOSIS — Z7982 Long term (current) use of aspirin: Secondary | ICD-10-CM | POA: Insufficient documentation

## 2019-12-31 DIAGNOSIS — I130 Hypertensive heart and chronic kidney disease with heart failure and stage 1 through stage 4 chronic kidney disease, or unspecified chronic kidney disease: Secondary | ICD-10-CM | POA: Diagnosis not present

## 2019-12-31 DIAGNOSIS — N182 Chronic kidney disease, stage 2 (mild): Secondary | ICD-10-CM | POA: Insufficient documentation

## 2019-12-31 DIAGNOSIS — Z794 Long term (current) use of insulin: Secondary | ICD-10-CM | POA: Insufficient documentation

## 2019-12-31 DIAGNOSIS — Z7902 Long term (current) use of antithrombotics/antiplatelets: Secondary | ICD-10-CM | POA: Insufficient documentation

## 2019-12-31 DIAGNOSIS — I5022 Chronic systolic (congestive) heart failure: Secondary | ICD-10-CM | POA: Diagnosis not present

## 2019-12-31 DIAGNOSIS — J45909 Unspecified asthma, uncomplicated: Secondary | ICD-10-CM | POA: Insufficient documentation

## 2019-12-31 DIAGNOSIS — Z8546 Personal history of malignant neoplasm of prostate: Secondary | ICD-10-CM | POA: Insufficient documentation

## 2019-12-31 DIAGNOSIS — E860 Dehydration: Secondary | ICD-10-CM | POA: Diagnosis not present

## 2019-12-31 DIAGNOSIS — Z79899 Other long term (current) drug therapy: Secondary | ICD-10-CM | POA: Diagnosis not present

## 2019-12-31 DIAGNOSIS — R55 Syncope and collapse: Secondary | ICD-10-CM | POA: Diagnosis not present

## 2019-12-31 LAB — CBC
HCT: 37.7 % — ABNORMAL LOW (ref 39.0–52.0)
Hemoglobin: 12.7 g/dL — ABNORMAL LOW (ref 13.0–17.0)
MCH: 30 pg (ref 26.0–34.0)
MCHC: 33.7 g/dL (ref 30.0–36.0)
MCV: 89.1 fL (ref 80.0–100.0)
Platelets: 313 10*3/uL (ref 150–400)
RBC: 4.23 MIL/uL (ref 4.22–5.81)
RDW: 12.7 % (ref 11.5–15.5)
WBC: 10.5 10*3/uL (ref 4.0–10.5)
nRBC: 0 % (ref 0.0–0.2)

## 2019-12-31 LAB — BASIC METABOLIC PANEL
Anion gap: 11 (ref 5–15)
BUN: 33 mg/dL — ABNORMAL HIGH (ref 8–23)
CO2: 22 mmol/L (ref 22–32)
Calcium: 9.1 mg/dL (ref 8.9–10.3)
Chloride: 104 mmol/L (ref 98–111)
Creatinine, Ser: 2.11 mg/dL — ABNORMAL HIGH (ref 0.61–1.24)
GFR, Estimated: 34 mL/min — ABNORMAL LOW (ref >60)
Glucose, Bld: 206 mg/dL — ABNORMAL HIGH (ref 70–99)
Potassium: 4.1 mmol/L (ref 3.5–5.1)
Sodium: 137 mmol/L (ref 135–145)

## 2019-12-31 LAB — URINALYSIS, ROUTINE W REFLEX MICROSCOPIC
Glucose, UA: NEGATIVE mg/dL
Hgb urine dipstick: NEGATIVE
Ketones, ur: NEGATIVE mg/dL
Leukocytes,Ua: NEGATIVE
Nitrite: NEGATIVE
Protein, ur: NEGATIVE mg/dL
Specific Gravity, Urine: >1.03 — ABNORMAL HIGH (ref 1.005–1.030)
pH: 5 (ref 5.0–8.0)

## 2019-12-31 NOTE — ED Provider Notes (Signed)
[facility] EMERGENCY DEPARTMENT Provider Note   CSN: [id number] Arrival date & time: 12/31/19  1528     History Chief Complaint  Patient presents with  . Loss of Consciousness    [name] is a 64 y.o. male.  Pt presents to the ED today with syncope.  Pt has had some abdominal cramping and had a bowel movement today which made him feel very hot and dizzy.  He had to go back to the bathroom, felt the same way, but worse.  He stood up and passed out.  Pt said he is feeling much better now after waiting in the [place] for several hours.        Past Medical History:  Diagnosis Date  . Asthma   . CAD (coronary artery disease)    a. non obst dz by cath 2004  b. 11/12/15 which showed no obvious large vessel CAD. No PCI or clear reason for NSTEMI ( possibly hypertensive urgency?).   [name] CARDIOMYOPATHY    a. NICM, LVEF 30% 01/2011 echo; 20-25% 07/2012 echo, EF recovered by echo 06/2015.  . Congestive heart failure (CHF) ([place])   . DIABETES MELLITUS, TYPE II   . HYPERCHOLESTEROLEMIA   . HYPERTENSION   . Myocardial infarct ([place]) 10/2015  . Noncompliance   . Prostate cancer [facility])    prostate cancer  . PVCs (premature ventricular contractions)    a. s/p ablation 01-07-2013 by Dr Rayann Heman    Patient Active Problem List   Diagnosis Date Noted  . COVID-19 virus infection 02/07/2019  . Diarrhea 02/07/2019  . Acute on chronic renal failure ([place]) 02/06/2019  . Routine general medical examination at a health care facility 03/12/2018  . Balanitis 10/15/2016  . NSTEMI (non-ST elevated myocardial infarction) ([place]) 11/13/2015  . PVCs (premature ventricular contractions)   . CAD (coronary artery disease)   . Acute kidney injury ([place]) 07/19/2015  . AKI (acute kidney injury) ([place]) 07/19/2015  . Hyperglycemia 07/12/2015  . Prostate cancer ([place]) 02/22/2014  . Cholelithiasis and cholecystitis without obstruction 11/16/2013  . PVC's (premature ventricular contractions) 01/07/2013  .  CKD (chronic kidney disease) stage 2, GFR 60-89 ml/min 08/25/2012  . OSA (obstructive sleep apnea) 08/25/2012  . Chronic systolic heart failure ([place]) 08/06/2012  . HTN (hypertension), malignant 02/12/2011  . Non compliance w medication regimen 02/12/2011  . HYPERCHOLESTEROLEMIA 09/17/2009  . PREMATURE VENTRICULAR CONTRACTIONS 09/17/2009  . PSA, INCREASED 04/26/2009  . Diabetes type 2, uncontrolled ([place]) 04/25/2009  . CARDIOMYOPATHY 04/25/2009    Past Surgical History:  Procedure Laterality Date  . ABLATION  01-07-2013   RVOT PVC's ablated by Dr Rayann Heman along anteroseptal RVOT  . CARDIAC CATHETERIZATION  2004   nonobst dz  . CARDIAC CATHETERIZATION N/A 11/12/2015   Procedure: Left Heart Cath and Coronary Angiography;  Surgeon: Sherren Mocha, MD;  Location: [place] CV LAB;  Service: Cardiovascular;  Laterality: N/A;  . CHOLECYSTECTOMY N/A 11/16/2013   Procedure: LAPAROSCOPIC CHOLECYSTECTOMY WITH INTRAOPERATIVE CHOLANGIOGRAM;  Surgeon: [name], MD;  [facility];  Service: General;  Laterality: N/A;  . LYMPHADENECTOMY Bilateral 02/22/2014   Procedure: PELVIC LYMPH NODE DISSECTION;  Surgeon: [name], MD;  [facility];  Service: Urology;  Laterality: Bilateral;  . PROSTATE SURGERY    . ROBOT ASSISTED LAPAROSCOPIC RADICAL PROSTATECTOMY N/A 02/22/2014   Procedure: ROBOTIC ASSISTED LAPAROSCOPIC RADICAL PROSTATECTOMY WITH INDOCYANINE GREEN DYE AND OPEN UMBILICAL HERNIA REPAIR;  Surgeon: [name], MD;  [facility];  Service: Urology;  Laterality: N/A;  . SUPRAVENTRICULAR  TACHYCARDIA ABLATION N/A 09/28/2012   Procedure: Tanna Furry Ablation;  Surgeon: Janalynn Eder Grayer, MD;  Location: [facility] CATH LAB;  Service: Cardiovascular;  Laterality: N/A;  . SURGERY SCROTAL / TESTICULAR     at age 36  . V-TACH ABLATION N/A 01/07/2013   Procedure: V-TACH ABLATION;  Surgeon: [name], MD;  Location: [facility] CATH LAB;  Service: Cardiovascular;  Laterality: N/A;       Family History   Problem Relation Age of Onset  . Hypertension Mother   . Peripheral vascular disease Mother   . Diabetes Mother   . Peripheral vascular disease Other   . Hypertension Other   . Prostate cancer Other   . Prostate cancer Brother   . Lung cancer Brother   . Heart attack Paternal Grandfather   . Heart failure Maternal Grandmother   . Diabetes Son   . Colon cancer Sister   . Prostate cancer Brother   . Prostate cancer Brother   . Prostate cancer Brother   . Pancreatic cancer Paternal Aunt   . Esophageal cancer Neg Hx     Social History   Tobacco Use  . Smoking status: Never Smoker  . Smokeless tobacco: Never Used  . 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 Vaping Use: Never used  Substance Use Topics  . Alcohol use: Yes    Comment: rare  . Drug use: No    Home Medications Prior to Admission medications   Medication Sig Start Date End Date Taking? Authorizing Provider  aspirin EC 81 MG EC tablet Take 1 tablet (81 mg total) by mouth daily. 11/14/15   Eileen Stanford, PA-C  atorvastatin (LIPITOR) 80 MG tablet Take 1 tablet by mouth once daily 11/29/19   Marrian Salvage, FNP  carvedilol (COREG) 25 MG tablet TAKE 1 TABLET BY MOUTH TWICE DAILY WITH A MEAL Patient taking differently: Take 25 mg by mouth 2 (two) times daily with a meal.  04/19/19   Isaiah Serge, NP  clopidogrel (PLAVIX) 75 MG tablet Take 1 tablet (75 mg total) by mouth daily. Please make overdue appt with Dr. Marlou Porch before anymore refills. Thank you 1st attempt 12/07/19   Jerline Pain, MD  hydrochlorothiazide (HYDRODIURIL) 25 MG tablet Take 25 mg by mouth daily. 06/08/19   [provider]  insulin glargine (LANTUS SOLOSTAR) 100 UNIT/ML Solostar Pen Inject 55 Units into the skin every morning. And pen needles 1/day Patient taking differently: Inject 55 Units into the skin daily.  06/02/19   Renato Shin, MD  isosorbide mononitrate (IMDUR) 60 MG 24 hr  tablet Take 1.5 tablets (90 mg total) by mouth daily. Please make overdue appt with Dr. Marlou Porch before anymore refills. Thank you 1st 12/29/19   Jerline Pain, MD  lisinopril (ZESTRIL) 40 MG tablet Take 40 mg by mouth daily. 06/08/19   [provider]  Multiple Vitamin (MULTIVITAMIN WITH MINERALS) TABS tablet Take 1 tablet by mouth daily. 02/10/19   Hongalgi, Lenis Dickinson, MD    Allergies    Ambien [zolpidem tartrate] and Metformin and related  Review of Systems   Review of Systems  Neurological: Positive for syncope.  All other systems reviewed and are negative.   Physical Exam Updated Vital Signs BP (!) 157/102 (BP Location: Right Arm)   Pulse 76   Temp 97.9 F (36.6 C) (Oral)   Resp 18   Ht 5\' 5"  (1.651 m)   Wt 89.4 kg   SpO2  98%   BMI 32.78 kg/m   Physical Exam Vitals and nursing note reviewed.  Constitutional:      Appearance: Normal appearance.  HENT:     Head: Normocephalic and atraumatic.     Right Ear: External ear normal.     Left Ear: External ear normal.     Mouth/Throat:     Mouth: Mucous membranes are moist.     Pharynx: Oropharynx is clear.  Eyes:     Extraocular Movements: Extraocular movements intact.     Conjunctiva/sclera: Conjunctivae normal.     Pupils: Pupils are equal, round, and reactive to light.  Cardiovascular:     Rate and Rhythm: Normal rate and regular rhythm.     Pulses: Normal pulses.     Heart sounds: Normal heart sounds.  Pulmonary:     Effort: Pulmonary effort is normal.     Breath sounds: Normal breath sounds.  Abdominal:     General: Abdomen is flat. Bowel sounds are normal.     Palpations: Abdomen is soft.  Musculoskeletal:        General: Normal range of motion.     Cervical back: Normal range of motion and neck supple.  Skin:    General: Skin is warm.     Capillary Refill: Capillary refill takes less than 2 seconds.  Neurological:     General: No focal deficit present.     Mental Status: He is alert and oriented to  person, place, and time.  Psychiatric:        Mood and Affect: Mood normal.        Behavior: Behavior normal.        Thought Content: Thought content normal.        Judgment: Judgment normal.     ED Results / Procedures / Treatments   Labs (all labs ordered are listed, but only abnormal results are displayed) Labs Reviewed  BASIC METABOLIC PANEL - Abnormal; Notable for the following components:      Result Value   Glucose, Bld 206 (*)    BUN 33 (*)    Creatinine, Ser 2.11 (*)    GFR, Estimated 34 (*)    All other components within normal limits  CBC - Abnormal; Notable for the following components:   Hemoglobin 12.7 (*)    HCT 37.7 (*)    All other components within normal limits  URINALYSIS, ROUTINE W REFLEX MICROSCOPIC - Abnormal; Notable for the following components:   APPearance HAZY (*)    Specific Gravity, Urine >1.030 (*)    Bilirubin Urine SMALL (*)    All other components within normal limits  CBG MONITORING, ED    EKG EKG Interpretation  Date/Time:  Saturday December 31 2019 15:43:29 EST Ventricular Rate:  87 PR Interval:  192 QRS Duration: 124 QT Interval:  386 QTC Calculation: 464 R Axis:   -52 Text Interpretation: Normal sinus rhythm Left axis deviation Left ventricular hypertrophy with QRS widening and repolarization abnormality ( R in aVL , Cornell product ) Abnormal ECG No significant change since last tracing Confirmed by Isla Pence [phone]) on 12/31/2019 4:46:26 PM   Radiology No results found.  Procedures Procedures (including critical care time)  Medications Ordered in ED Medications - No data to display  ED Course  I have reviewed the triage vital signs and the nursing notes.  Pertinent labs & imaging results that were available during my care of the patient were reviewed by me and considered in my medical decision making (  see chart for details).    MDM Rules/Calculators/A&P                          Pt's labs show nothing acute.   He has no dizziness with ambulation.  He is offered IV fluids, but declines.  He is ready to go home and get something to eat.  Syncopal event seems vasovagal in nature, so I think it is safe for d/c.  Pt knows to return if worse.  F/u with pcp.   Final Clinical Impression(s) / ED Diagnoses Final diagnoses:  Vasovagal syncope  Dehydration    Rx / DC Orders ED Discharge Orders    None       Isla Pence, MD 12/31/19 2258

## 2019-12-31 NOTE — ED Triage Notes (Signed)
Reports he felt some abdominal cramping that prompted him to have a bm.  Noticed he was dizzy then passed out when he stood up.  Endorses having a dizzy spell while tying shoes prior to that as well.

## 2019-12-31 NOTE — ED Notes (Addendum)
Walked with pt to the d/c window. Denies any dizziness or complaints other than a mild h/a. States he has not eaten since noon. Offered pt something to eat but states his daughter was taking him to eat after d/c

## 2020-01-01 LAB — CBG MONITORING, ED: Glucose-Capillary: 182 mg/dL — ABNORMAL HIGH (ref 70–99)

## 2020-01-12 ENCOUNTER — Other Ambulatory Visit: Payer: Self-pay | Admitting: Nurse Practitioner

## 2020-01-14 ENCOUNTER — Other Ambulatory Visit: Payer: Self-pay | Admitting: Nurse Practitioner

## 2020-01-16 ENCOUNTER — Encounter: Payer: Self-pay | Admitting: Cardiology

## 2020-01-16 ENCOUNTER — Ambulatory Visit (INDEPENDENT_AMBULATORY_CARE_PROVIDER_SITE_OTHER): Payer: BC Managed Care – PPO | Admitting: Cardiology

## 2020-01-16 ENCOUNTER — Other Ambulatory Visit: Payer: Self-pay

## 2020-01-16 VITALS — BP 130/80 | HR 80 | Ht 65.0 in | Wt 186.0 lb

## 2020-01-16 DIAGNOSIS — I428 Other cardiomyopathies: Secondary | ICD-10-CM

## 2020-01-16 DIAGNOSIS — R55 Syncope and collapse: Secondary | ICD-10-CM | POA: Diagnosis not present

## 2020-01-16 DIAGNOSIS — I2510 Atherosclerotic heart disease of native coronary artery without angina pectoris: Secondary | ICD-10-CM | POA: Diagnosis not present

## 2020-01-16 MED ORDER — LISINOPRIL 40 MG PO TABS: 40.0000 mg | ORAL_TABLET | Freq: Every day | ORAL | 3 refills | Status: DC

## 2020-01-16 MED ORDER — CLOPIDOGREL BISULFATE 75 MG PO TABS: 75.0000 mg | ORAL_TABLET | Freq: Every day | ORAL | 3 refills | Status: DC

## 2020-01-16 NOTE — Progress Notes (Signed)
Cardiology Office Note:    Date:  01/16/2020   ID:  [name], DOB 08-Jan-1956, MRN [id number]  PCP:  Marrian Salvage, [place] Cardiologist:  Candee Furbish, MD  [facility] Electrophysiologist:  None   Referring MD: Marrian Salvage,*     History of Present Illness:    [name] is a 64 y.o. male of loss of consciousness.  He was in the emergency department on 12/31/2019.  Had an episode of syncope.  Abdominal cramping bowel movement made him feel hot, dizzy, sweaty.  Went to the bathroom ended up standing up and passed out.  After he was waiting in the waiting room for several hours he felt back to baseline.  Had a prior episode when at [employer] straining in bathroom.    CoVID 02/2019 - very sick, EMS. Not yet. Saw dead aunt. Dex, Remdis,  Lost taste   Vaccine with booster.   Past Medical History:  Diagnosis Date  . Asthma   . CAD (coronary artery disease)    a. non obst dz by cath 2004  b. 11/12/15 which showed no obvious large vessel CAD. No PCI or clear reason for NSTEMI ( possibly hypertensive urgency?).   [name] CARDIOMYOPATHY    a. NICM, LVEF 30% 01/2011 echo; 20-25% 07/2012 echo, EF recovered by echo 06/2015.  . Congestive heart failure (CHF) ([place])   . DIABETES MELLITUS, TYPE II   . HYPERCHOLESTEROLEMIA   . HYPERTENSION   . Myocardial infarct ([place]) 10/2015  . Noncompliance   . Prostate cancer [facility])    prostate cancer  . PVCs (premature ventricular contractions)    a. s/p ablation 01-07-2013 by Dr Rayann Heman    Past Surgical History:  Procedure Laterality Date  . ABLATION  01-07-2013   RVOT PVC's ablated by Dr Rayann Heman along anteroseptal RVOT  . CARDIAC CATHETERIZATION  2004   nonobst dz  . CARDIAC CATHETERIZATION N/A 11/12/2015   Procedure: Left Heart Cath and Coronary Angiography;  Surgeon: Sherren Mocha, MD;  Location: [facility] CV LAB;  Service: Cardiovascular;  Laterality: N/A;  . CHOLECYSTECTOMY N/A 11/16/2013   Procedure:  LAPAROSCOPIC CHOLECYSTECTOMY WITH INTRAOPERATIVE CHOLANGIOGRAM;  Surgeon: [name], MD;  [facility];  Service: General;  Laterality: N/A;  . LYMPHADENECTOMY Bilateral 02/22/2014   Procedure: PELVIC LYMPH NODE DISSECTION;  Surgeon: [name], MD;  [facility];  Service: Urology;  Laterality: Bilateral;  . PROSTATE SURGERY    . ROBOT ASSISTED LAPAROSCOPIC RADICAL PROSTATECTOMY N/A 02/22/2014   Procedure: ROBOTIC ASSISTED LAPAROSCOPIC RADICAL PROSTATECTOMY WITH INDOCYANINE GREEN DYE AND OPEN UMBILICAL HERNIA REPAIR;  Surgeon: [name], MD;  [facility];  Service: Urology;  Laterality: N/A;  . SUPRAVENTRICULAR TACHYCARDIA ABLATION N/A 09/28/2012   Procedure: Tanna Furry Ablation;  Surgeon: [name], MD;  Location: [facility] CATH LAB;  Service: Cardiovascular;  Laterality: N/A;  . SURGERY SCROTAL / TESTICULAR     at age 41  . V-TACH ABLATION N/A 01/07/2013   Procedure: V-TACH ABLATION;  Surgeon: [name], MD;  Location: [place] CATH LAB;  Service: Cardiovascular;  Laterality: N/A;    Current Medications: Current Meds  Medication Sig  . aspirin EC 81 MG EC tablet Take 1 tablet (81 mg total) by mouth daily.  [name] atorvastatin (LIPITOR) 80 MG tablet Take 1 tablet by mouth once daily  . carvedilol (COREG) 25 MG tablet TAKE 1 TABLET BY MOUTH TWICE DAILY WITH A MEAL  . hydrochlorothiazide (HYDRODIURIL) 25 MG tablet Take 1 tablet (25  mg total) by mouth daily. Please keep upcoming appt in December with Dr. Marlou Porch before anymore refills. Thank you  . insulin glargine (LANTUS SOLOSTAR) 100 UNIT/ML Solostar Pen Inject 55 Units into the skin every morning. And pen needles 1/day (Patient taking differently: Inject 55 Units into the skin daily.)  . isosorbide mononitrate (IMDUR) 60 MG 24 hr tablet Take 1.5 tablets (90 mg total) by mouth daily. Please make overdue appt with Dr. Marlou Porch before anymore refills. Thank you 1st  . Multiple Vitamin (MULTIVITAMIN WITH MINERALS) TABS tablet Take 1  tablet by mouth daily.  . [DISCONTINUED] clopidogrel (PLAVIX) 75 MG tablet Take 1 tablet (75 mg total) by mouth daily. Please make overdue appt with Dr. Marlou Porch before anymore refills. Thank you 1st attempt  . [DISCONTINUED] lisinopril (ZESTRIL) 40 MG tablet Take 40 mg by mouth daily.     Allergies:   Ambien [zolpidem tartrate] and Metformin and related   Social History   Socioeconomic History  . Marital status: Single    Spouse name: Not on file  . Number of children: Not on file  . Years of education: Not on file  . Highest education level: Not on file  Occupational History  . Occupation: Pension scheme manager: [employer]  Tobacco Use  . Smoking status: Never Smoker  . Smokeless tobacco: Never Used  . 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 Vaping Use: Never used  Substance and Sexual Activity  . Alcohol use: Yes    Comment: rare  . Drug use: No  . Sexual activity: Never  Other Topics Concern  . Not on file  Social History Narrative   Single - divorced x 3 -   Lives with 2 sons, enjoys spending time with 6 g-kids and 3 kids   Social Determinants of Health   Financial Resource Strain: Not on file  Food Insecurity: Not on file  Transportation Needs: Not on file  Physical Activity: Not on file  Stress: Not on file  Social Connections: Not on file     Family History: The patient's family history includes Colon cancer in his sister; Diabetes in his mother and son; Heart attack in his paternal grandfather; Heart failure in his maternal grandmother; Hypertension in his mother and another family member; Lung cancer in his brother; Pancreatic cancer in his paternal aunt; Peripheral vascular disease in his mother and another family member; Prostate cancer in his brother, brother, brother, brother and another family member. There is no history of Esophageal cancer.  ROS:   Please see the history of present illness.      All other systems reviewed and are negative.  EKGs/Labs/Other Studies Reviewed:    EKG-12/31/2019-LVH with left axis deviation.   Recent Labs: 02/09/2019: ALT 34; Magnesium 1.6 12/31/2019: BUN 33; Creatinine, Ser 2.11; Hemoglobin 12.7; Platelets 313; Potassium 4.1; Sodium 137  Recent Lipid Panel    Component Value Date/Time   CHOL 86 (L) 10/11/2018 0955   TRIG 210 (H) 02/06/2019 1413   TRIG 200 09/20/2007 0000   HDL 30 (L) 10/11/2018 0955   CHOLHDL 2.9 10/11/2018 0955   CHOLHDL 5 03/12/2018 0920   VLDL 20.0 03/12/2018 0920   LDLCALC 39 10/11/2018 0955   LDLDIRECT 148.8 04/25/2009 0000     Risk Assessment/Calculations:       Physical Exam:    VS:  BP 130/80 (BP Location: Left Arm, Patient Position: Sitting, Cuff Size: Normal)   Pulse  80   Ht 5\' 5"  (1.651 m)   Wt 186 lb (84.4 kg)   SpO2 98%   BMI 30.95 kg/m     Wt Readings from Last 3 Encounters:  01/16/20 186 lb (84.4 kg)  12/31/19 197 lb (89.4 kg)  06/23/19 180 lb (81.6 kg)     GEN:  Well nourished, well developed in no acute distress HEENT: Normal NECK: No JVD; No carotid bruits LYMPHATICS: No lymphadenopathy CARDIAC: RRR, no murmurs, rubs, gallops RESPIRATORY:  Clear to auscultation without rales, wheezing or rhonchi  ABDOMEN: Soft, non-tender, non-distended MUSCULOSKELETAL:  No edema; No deformity  SKIN: Warm and dry NEUROLOGIC:  Alert and oriented x 3 PSYCHIATRIC:  Normal affect   ASSESSMENT:    1. Vasovagal syncope   2. NICM (nonischemic cardiomyopathy) ([place]) with improved EF   3. CAD in native artery    PLAN:    In order of problems listed above:  Vasovagal syncope -Classic symptoms.  He has had this before.  Maintain hydration.  If he begins to feel the prodrome of hot, sweaty he knows to lay down.  Gatorade. -Prior monitor as follows: Sinus rhythm with average heart rate of 75 BPM  Occasional premature ventricular contractions and premature atrial contractions (PVC, PAC)  No atrial  fibrillation. No adverse arrhtyhmia.  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  Had a near death experience he states.    Prior cardiomyopathy -EF returned to low normal.  50-55 Percent EF.  Obstructive coronary artery disease -Continue with aggressive risk factor modification.  Essential hypertension -LVH noted on echocardiogram.  Good overall blood pressure control.  Continue with current medical management  Diabetes with CKD -Encouraged visits with Dr. Loanne Drilling. -Creatinine ranging from 2.1-2.4          Medication Adjustments/Labs and Tests Ordered: Current medicines are reviewed at length with the patient today.  Concerns regarding medicines are outlined above.  No orders of the defined types were placed in this encounter.  No orders of the defined types were placed in this encounter.   Patient Instructions  Medication Instructions:  The current medical regimen is effective;  continue present plan and medications.  *If you need a refill on your cardiac medications before your next appointment, please call your pharmacy*  Follow-Up: At [facility], you and your health needs are our priority.  As part of our continuing mission to provide you with exceptional heart care, we have created designated Provider Care Teams.  These Care Teams include your primary Cardiologist (physician) and Advanced Practice Providers (APPs -  Physician Assistants and Nurse Practitioners) who all work together to provide you with the care you need, when you need it.  We recommend signing up for the patient portal called "MyChart".  Sign up information is provided on this After Visit Summary.  MyChart is used to connect with patients for Virtual Visits (Telemedicine).  Patients are able to view lab/test results, encounter notes, upcoming appointments, etc.  Non-urgent messages can be sent to your provider as well.   To learn more about what you can do with MyChart, go to [email or web address].     Your next appointment:   6 month(s)  The format for your next appointment:   In Person  Provider:   Candee Furbish, MD   Thank you for choosing [facility]!!         Signed, Candee Furbish, MD  01/16/2020 9:23 AM    [place]

## 2020-01-16 NOTE — Patient Instructions (Signed)

## 2020-01-16 NOTE — Telephone Encounter (Signed)
Outpatient Medication Detail   Disp Refills Start End   clopidogrel (PLAVIX) 75 MG tablet 90 tablet 3 01/16/2020    Sig - Route: Take 1 tablet (75 mg total) by mouth daily. Please make overdue appt with Dr. Marlou Porch before anymore refills. Thank you 1st attempt - Oral   Sent to pharmacy as: clopidogrel (PLAVIX) 75 MG tablet   Notes to Pharmacy: Please call our office to schedule an overdue appointment with Dr. Marlou Porch before anymore refills. [phone]. Thank you 1st attempt   E-Prescribing Status: Receipt confirmed by pharmacy (01/16/2020  9:01 AM EST)     Pharmacy  [place], [place]

## 2020-01-29 ENCOUNTER — Other Ambulatory Visit: Payer: Self-pay | Admitting: Cardiology

## 2020-01-30 ENCOUNTER — Other Ambulatory Visit: Payer: Self-pay | Admitting: *Deleted

## 2020-01-30 MED ORDER — ISOSORBIDE MONONITRATE ER 60 MG PO TB24: 90.0000 mg | ORAL_TABLET | Freq: Every day | ORAL | 3 refills | Status: DC

## 2020-02-06 ENCOUNTER — Emergency Department (HOSPITAL_COMMUNITY)
Admission: EM | Admit: 2020-02-06 | Discharge: 2020-02-06 | Disposition: A | Discharge status: Home / Self Care | Payer: BC Managed Care – PPO | Attending: Emergency Medicine | Admitting: Emergency Medicine

## 2020-02-06 ENCOUNTER — Encounter (HOSPITAL_COMMUNITY): Payer: Self-pay

## 2020-02-06 ENCOUNTER — Other Ambulatory Visit: Payer: Self-pay

## 2020-02-06 DIAGNOSIS — Z8616 Personal history of COVID-19: Secondary | ICD-10-CM | POA: Diagnosis not present

## 2020-02-06 DIAGNOSIS — R55 Syncope and collapse: Secondary | ICD-10-CM | POA: Insufficient documentation

## 2020-02-06 DIAGNOSIS — Z794 Long term (current) use of insulin: Secondary | ICD-10-CM | POA: Insufficient documentation

## 2020-02-06 DIAGNOSIS — I2510 Atherosclerotic heart disease of native coronary artery without angina pectoris: Secondary | ICD-10-CM | POA: Insufficient documentation

## 2020-02-06 DIAGNOSIS — N182 Chronic kidney disease, stage 2 (mild): Secondary | ICD-10-CM | POA: Insufficient documentation

## 2020-02-06 DIAGNOSIS — Z7902 Long term (current) use of antithrombotics/antiplatelets: Secondary | ICD-10-CM | POA: Diagnosis not present

## 2020-02-06 DIAGNOSIS — E1122 Type 2 diabetes mellitus with diabetic chronic kidney disease: Secondary | ICD-10-CM | POA: Diagnosis not present

## 2020-02-06 DIAGNOSIS — I130 Hypertensive heart and chronic kidney disease with heart failure and stage 1 through stage 4 chronic kidney disease, or unspecified chronic kidney disease: Secondary | ICD-10-CM | POA: Insufficient documentation

## 2020-02-06 DIAGNOSIS — I5022 Chronic systolic (congestive) heart failure: Secondary | ICD-10-CM | POA: Insufficient documentation

## 2020-02-06 DIAGNOSIS — Z79899 Other long term (current) drug therapy: Secondary | ICD-10-CM | POA: Diagnosis not present

## 2020-02-06 DIAGNOSIS — Z7982 Long term (current) use of aspirin: Secondary | ICD-10-CM | POA: Insufficient documentation

## 2020-02-06 DIAGNOSIS — Z8546 Personal history of malignant neoplasm of prostate: Secondary | ICD-10-CM | POA: Diagnosis not present

## 2020-02-06 LAB — CBC WITH DIFFERENTIAL/PLATELET
Abs Immature Granulocytes: 0.1 10*3/uL — ABNORMAL HIGH (ref 0.00–0.07)
Basophils Absolute: 0.1 10*3/uL (ref 0.0–0.1)
Basophils Relative: 1 %
Eosinophils Absolute: 0 10*3/uL (ref 0.0–0.5)
Eosinophils Relative: 0 %
HCT: 37.9 % — ABNORMAL LOW (ref 39.0–52.0)
Hemoglobin: 12.5 g/dL — ABNORMAL LOW (ref 13.0–17.0)
Immature Granulocytes: 1 %
Lymphocytes Relative: 12 %
Lymphs Abs: 1.2 10*3/uL (ref 0.7–4.0)
MCH: 30 pg (ref 26.0–34.0)
MCHC: 33 g/dL (ref 30.0–36.0)
MCV: 90.9 fL (ref 80.0–100.0)
Monocytes Absolute: 1.1 10*3/uL — ABNORMAL HIGH (ref 0.1–1.0)
Monocytes Relative: 11 %
Neutro Abs: 7.4 10*3/uL (ref 1.7–7.7)
Neutrophils Relative %: 75 %
Platelets: 250 10*3/uL (ref 150–400)
RBC: 4.17 MIL/uL — ABNORMAL LOW (ref 4.22–5.81)
RDW: 12.2 % (ref 11.5–15.5)
WBC: 9.9 10*3/uL (ref 4.0–10.5)
nRBC: 0 % (ref 0.0–0.2)

## 2020-02-06 LAB — COMPREHENSIVE METABOLIC PANEL
ALT: 36 U/L (ref 0–44)
AST: 37 U/L (ref 15–41)
Albumin: 3.7 g/dL (ref 3.5–5.0)
Alkaline Phosphatase: 70 U/L (ref 38–126)
Anion gap: 10 (ref 5–15)
BUN: 29 mg/dL — ABNORMAL HIGH (ref 8–23)
CO2: 22 mmol/L (ref 22–32)
Calcium: 9 mg/dL (ref 8.9–10.3)
Chloride: 105 mmol/L (ref 98–111)
Creatinine, Ser: 2.11 mg/dL — ABNORMAL HIGH (ref 0.61–1.24)
GFR, Estimated: 34 mL/min — ABNORMAL LOW (ref >60)
Glucose, Bld: 166 mg/dL — ABNORMAL HIGH (ref 70–99)
Potassium: 4.4 mmol/L (ref 3.5–5.1)
Sodium: 137 mmol/L (ref 135–145)
Total Bilirubin: 1 mg/dL (ref 0.3–1.2)
Total Protein: 7.6 g/dL (ref 6.5–8.1)

## 2020-02-06 LAB — BLOOD GAS, VENOUS
Acid-base deficit: 1.9 mmol/L (ref 0.0–2.0)
Bicarbonate: 23.3 mmol/L (ref 20.0–28.0)
FIO2: 21
O2 Saturation: 79.6 %
Patient temperature: 98.6
pCO2, Ven: 44 mmHg (ref 44.0–60.0)
pH, Ven: 7.344 (ref 7.250–7.430)
pO2, Ven: 48.8 mmHg — ABNORMAL HIGH (ref 32.0–45.0)

## 2020-02-06 MED ORDER — SODIUM CHLORIDE 0.9 % IV BOLUS
1000.0000 mL | Freq: Once | INTRAVENOUS | Status: AC
  Administered 2020-02-06: 1000 mL via INTRAVENOUS

## 2020-02-06 MED ORDER — ONDANSETRON HCL 4 MG/2ML IJ SOLN
4.0000 mg | Freq: Once | INTRAMUSCULAR | Status: AC
  Administered 2020-02-06: 4 mg via INTRAVENOUS
  Filled 2020-02-06: qty 2

## 2020-02-06 NOTE — ED Notes (Signed)
Ambulated in hallway. Patient denies dizziness, shob, weakness

## 2020-02-06 NOTE — Discharge Instructions (Addendum)
The testing today did not show any service problems.  Call your primary care doctor for a follow-up appointment in 1 week to be seen for checkup.  Make sure that you are eating and drinking regularly.  Take all of your medicines and continue to follow your low carbohydrate diet.

## 2020-02-06 NOTE — ED Triage Notes (Signed)
Patient at work and syncopal episode. Denies LOC/Hitting head/blood thinners. Pt states at 7am started having weakness , diarrhea, and dizziness. On ems arrival patient supine on floor with bp 80/60. cbg- 160. Known diabetic. Hx of vasovagal syncopal episodes.

## 2020-02-06 NOTE — ED Provider Notes (Signed)
[facility]-EMERGENCY DEPT Provider Note   CSN: [id number] Arrival date & time: 02/06/20  9741     History Chief Complaint  Patient presents with  . Near Syncope    [name] is a 65 y.o. male.  HPI Patient here for evaluation of syncope.  He had prodrome of weakness and dizziness.  Evaluated by EMS at the scene found to be hypotensive with blood pressure of 80/60.  At that time CBG was evaded at 160.  Apparently he has had a history of vasovagal episodes.  He saw cardiology about it, 3 weeks ago.  No interventions were done, he was given precautions to help avoid similar episodes in the future.  Patient states that this morning he was at work, went to the bathroom to have a bowel movement which he did.  As he left the bathroom he "passed out."  He denies injury.  He reports he still feels uncomfortable with "nausea."  He did not eat this morning because he typically does not do that until after his client to eat.  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  He states he has been checking his blood sugar daily every morning and it runs about 100.  He denies recent vomiting, cough, fever, chills, focal weakness or paresthesia.  He states he is taking his usual medications as prescribed.  He reports having the episode of syncope 1 month ago but none since and none prior.  He thinks he might of had cardiac disease, heart failure in the past.   There are no known sick contacts.  There are no other complaints or known modifying factors.  Past Medical History:  Diagnosis Date  . Asthma   . CAD (coronary artery disease)    a. non obst dz by cath 2004  b. 11/12/15 which showed no obvious large vessel CAD. No PCI or clear reason for NSTEMI ( possibly hypertensive urgency?).   [name] CARDIOMYOPATHY    a. NICM, LVEF 30% 01/2011 echo; 20-25% 07/2012 echo, EF recovered by echo 06/2015.  . Congestive heart failure (CHF) (HCC)   . DIABETES MELLITUS,  TYPE II   . HYPERCHOLESTEROLEMIA   . HYPERTENSION   . Myocardial infarct (HCC) 10/2015  . Noncompliance   . Prostate cancer [facility])    prostate cancer  . PVCs (premature ventricular contractions)    a. s/p ablation 01-07-2013 by Dr Johney Frame    Patient Active Problem List   Diagnosis Date Noted  . COVID-19 virus infection 02/07/2019  . Diarrhea 02/07/2019  . Acute on chronic renal failure (HCC) 02/06/2019  . Routine general medical examination at a health care facility 03/12/2018  . Balanitis 10/15/2016  . NSTEMI (non-ST elevated myocardial infarction) (HCC) 11/13/2015  . PVCs (premature ventricular contractions)   . CAD (coronary artery disease)   . Acute kidney injury (HCC) 07/19/2015  . AKI (acute kidney injury) (HCC) 07/19/2015  . Hyperglycemia 07/12/2015  . Prostate cancer (HCC) 02/22/2014  . Cholelithiasis and cholecystitis without obstruction 11/16/2013  . PVC's (premature ventricular contractions) 01/07/2013  . CKD (chronic kidney disease) stage 2, GFR 60-89 ml/min 08/25/2012  . OSA (obstructive sleep apnea) 08/25/2012  . Chronic systolic heart failure (HCC) 08/06/2012  . HTN (hypertension), malignant 02/12/2011  . Non compliance w medication regimen 02/12/2011  . HYPERCHOLESTEROLEMIA 09/17/2009  . PREMATURE VENTRICULAR CONTRACTIONS 09/17/2009  . PSA, INCREASED 04/26/2009  . Diabetes type 2, uncontrolled (HCC) 04/25/2009  . CARDIOMYOPATHY 04/25/2009    Past Surgical  History:  Procedure Laterality Date  . ABLATION  01-07-2013   RVOT PVC's ablated by Dr Rayann Heman along anteroseptal RVOT  . CARDIAC CATHETERIZATION  2004   nonobst dz  . CARDIAC CATHETERIZATION N/A 11/12/2015   Procedure: Left Heart Cath and Coronary Angiography;  Surgeon: Sherren Mocha, MD;  Location: [facility];  Service: Cardiovascular;  Laterality: N/A;  . CHOLECYSTECTOMY N/A 11/16/2013   Procedure: LAPAROSCOPIC CHOLECYSTECTOMY WITH INTRAOPERATIVE CHOLANGIOGRAM;  Surgeon: [name], MD;   Location: [facility];  Service: General;  Laterality: N/A;  . LYMPHADENECTOMY Bilateral 02/22/2014   Procedure: PELVIC LYMPH NODE DISSECTION;  Surgeon: [name], MD;  Location: [facility];  Service: Urology;  Laterality: Bilateral;  . PROSTATE SURGERY    . ROBOT ASSISTED LAPAROSCOPIC RADICAL PROSTATECTOMY N/A 02/22/2014   Procedure: ROBOTIC ASSISTED LAPAROSCOPIC RADICAL PROSTATECTOMY WITH INDOCYANINE GREEN DYE AND OPEN UMBILICAL HERNIA REPAIR;  Surgeon: [name], MD;  Location: [facility];  Service: Urology;  Laterality: N/A;  . SUPRAVENTRICULAR TACHYCARDIA ABLATION N/A 09/28/2012   Procedure: Tanna Furry Ablation;  Surgeon: [name], MD;  Location: [facility] CATH LAB;  Service: Cardiovascular;  Laterality: N/A;  . SURGERY SCROTAL / TESTICULAR     at age 14  . V-TACH ABLATION N/A 01/07/2013   Procedure: V-TACH ABLATION;  Surgeon: [name], MD;  Location: [facility] CATH LAB;  Service: Cardiovascular;  Laterality: N/A;       Family History  Problem Relation Age of Onset  . Hypertension Mother   . Peripheral vascular disease Mother   . Diabetes Mother   . Peripheral vascular disease Other   . Hypertension Other   . Prostate cancer Other   . Prostate cancer Brother   . Lung cancer Brother   . Heart attack Paternal Grandfather   . Heart failure Maternal Grandmother   . Diabetes Son   . Colon cancer Sister   . Prostate cancer Brother   . Prostate cancer Brother   . Prostate cancer Brother   . Pancreatic cancer Paternal Aunt   . Esophageal cancer Neg Hx     Social History   Tobacco Use  . Smoking status: Never Smoker  . Smokeless tobacco: Never Used  . 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 Vaping Use: Never used  Substance Use Topics  . Alcohol use: Yes    Comment: rare  . Drug use: No    Home Medications Prior to Admission medications   Medication Sig Start Date End Date Taking? Authorizing Provider  aspirin EC 81 MG EC tablet Take 1  tablet (81 mg total) by mouth daily. 11/14/15   Eileen Stanford, PA-C  atorvastatin (LIPITOR) 80 MG tablet Take 1 tablet by mouth once daily 11/29/19   Marrian Salvage, FNP  carvedilol (COREG) 25 MG tablet TAKE 1 TABLET BY MOUTH TWICE DAILY WITH A MEAL 01/30/20   Isaiah Serge, NP  clopidogrel (PLAVIX) 75 MG tablet Take 1 tablet (75 mg total) by mouth daily. Please make overdue appt with Dr. Marlou Porch before anymore refills. Thank you 1st attempt 01/16/20   Jerline Pain, MD  hydrochlorothiazide (HYDRODIURIL) 25 MG tablet Take 1 tablet (25 mg total) by mouth daily. Please keep upcoming appt in December with Dr. Marlou Porch before anymore refills. Thank you 01/12/20   Jerline Pain, MD  insulin glargine (LANTUS SOLOSTAR) 100 UNIT/ML Solostar Pen Inject 55 Units into the skin every morning. And pen needles 1/day Patient taking differently: Inject 55  Units into the skin daily. 06/02/19   Renato Shin, MD  isosorbide mononitrate (IMDUR) 60 MG 24 hr tablet Take 1.5 tablets (90 mg total) by mouth daily. 01/30/20   Jerline Pain, MD  lisinopril (ZESTRIL) 40 MG tablet Take 1 tablet (40 mg total) by mouth daily. 01/16/20   Jerline Pain, MD  Multiple Vitamin (MULTIVITAMIN WITH MINERALS) TABS tablet Take 1 tablet by mouth daily. 02/10/19   Hongalgi, Lenis Dickinson, MD    Allergies    Ambien [zolpidem tartrate] and Metformin and related  Review of Systems   Review of Systems  All other systems reviewed and are negative.   Physical Exam Updated Vital Signs BP 93/65 (BP Location: Right Arm)   Pulse 63   Temp 97.6 F (36.4 C) (Oral)   Resp 12   Ht 5\' 5"  (1.651 m)   Wt 84 kg   SpO2 100%   BMI 30.82 kg/m   Physical Exam Vitals and nursing note reviewed.  Constitutional:      General: He is not in acute distress.    Appearance: He is well-developed and well-nourished. He is not ill-appearing, toxic-appearing or diaphoretic.  HENT:     Head: Normocephalic and atraumatic.     Right Ear: External ear  normal.     Left Ear: External ear normal.  Eyes:     Extraocular Movements: EOM normal.     Conjunctiva/sclera: Conjunctivae normal.     Pupils: Pupils are equal, round, and reactive to light.  Neck:     Trachea: Phonation normal.  Cardiovascular:     Rate and Rhythm: Normal rate and regular rhythm.     Heart sounds: Normal heart sounds.     Comments: Hypotensive Pulmonary:     Effort: Pulmonary effort is normal.     Breath sounds: Normal breath sounds.  Chest:     Chest wall: No bony tenderness.  Abdominal:     Palpations: Abdomen is soft.     Tenderness: There is no abdominal tenderness.  Musculoskeletal:        General: Normal range of motion.     Cervical back: Normal range of motion and neck supple.     Right lower leg: No edema.     Left lower leg: No edema.  Skin:    General: Skin is warm, dry and intact.  Neurological:     Mental Status: He is alert and oriented to person, place, and time.     Cranial Nerves: No cranial nerve deficit.     Sensory: No sensory deficit.     Motor: No abnormal muscle tone.     Coordination: Coordination normal.  Psychiatric:        Mood and Affect: Mood and affect and mood normal.        Behavior: Behavior normal.        Thought Content: Thought content normal.        Judgment: Judgment normal.     ED Results / Procedures / Treatments   Labs (all labs ordered are listed, but only abnormal results are displayed) Labs Reviewed - No data to display  EKG None  Radiology No results found.  Procedures Procedures (including critical care time)  Medications Ordered in ED Medications - No data to display  ED Course  I have reviewed the triage vital signs and the nursing notes.  Pertinent labs & imaging results that were available during my care of the patient were reviewed by me and considered in my  medical decision making (see chart for details).    MDM Rules/Calculators/A&P                           Patient Vitals for  the past 24 hrs:  BP Temp Temp src Pulse Resp SpO2 Height Weight  02/06/20 0945 (!) 87/69 - - 63 12 97 % - -  02/06/20 0907 - - - - - - 5\' 5"  (1.651 m) 84 kg  02/06/20 0904 93/65 97.6 F (36.4 C) Oral 63 12 100 % - -    3:04 PM Reevaluation with update and discussion. After initial assessment and treatment, an updated evaluation reveals patient is comfortable has no further complaints.  He has been able to ambulate.  Blood pressure now normal. Daleen Bo   Medical Decision Making:  This patient is presenting for evaluation of recurrent syncope, which does require a range of treatment options, and is a complaint that involves a high risk of morbidity and mortality. The differential diagnoses include vasovagal episode, cardiac instability, cardiac arrhythmia, complications of medical illness. I decided to review old records, and in summary elderly male, presenting for syncope, after seeing cardiologist for same about 3 weeks ago..  I did not require additional historical information from anyone.  Clinical Laboratory Tests Ordered, included CBC, Metabolic panel and Venous gas. Review indicates normal except glucose high, BUN high, creatinine high.  Cardiac Monitor Tracing which shows normal sinus rhythm    Critical Interventions-clinical evaluation, laboratory testing, orthostatics, ambulation trial, observation reassessment  After These Interventions, the Patient was reevaluated and was found stable for discharge.  Syncope without injury or ongoing instability.  Likely vagal episode.  No indication for hospitalization at this time.  Patient has baseline renal insufficiency, not worse than usual.  Initial blood pressure improved after treatment.  Nonspecific nausea resolved after treatment with Zofran.  No indication for hospitalization at this time.  CRITICAL CARE-no Performed by: Daleen Bo  Nursing Notes Reviewed/ Care Coordinated Applicable Imaging Reviewed Interpretation of  Laboratory Data incorporated into ED treatment  The patient appears reasonably screened and/or stabilized for discharge and I doubt any other medical condition or other [facility] requiring further screening, evaluation, or treatment in the ED at this time prior to discharge.  Plan: Home Medications-continue usual; Home Treatments-rest, fluids; return here if the recommended treatment, does not improve the symptoms; Recommended follow up-PCP checkup 1 week and as needed     Final Clinical Impression(s) / ED Diagnoses Final diagnoses:  None    Rx / DC Orders ED Discharge Orders    None       Daleen Bo, MD 02/06/20 1507

## 2020-02-06 NOTE — ED Notes (Signed)
Ginger ale provided.

## 2020-02-06 NOTE — ED Notes (Signed)
MD in room at this time.

## 2020-02-10 ENCOUNTER — Other Ambulatory Visit: Payer: Self-pay

## 2020-02-10 MED ORDER — HYDROCHLOROTHIAZIDE 25 MG PO TABS: 25.0000 mg | ORAL_TABLET | Freq: Every day | ORAL | 3 refills | Status: DC

## 2020-02-28 ENCOUNTER — Other Ambulatory Visit: Payer: Self-pay | Admitting: Family

## 2020-03-08 ENCOUNTER — Telehealth: Payer: Self-pay | Admitting: Cardiology

## 2020-03-08 NOTE — Telephone Encounter (Signed)
I did not need this encounter. °

## 2020-03-11 ENCOUNTER — Other Ambulatory Visit: Payer: Self-pay | Admitting: Cardiology

## 2020-03-12 ENCOUNTER — Other Ambulatory Visit: Payer: Self-pay

## 2020-03-12 ENCOUNTER — Ambulatory Visit: Payer: BC Managed Care – PPO | Admitting: Family

## 2020-03-12 VITALS — BP 132/82 | HR 82 | Temp 98.3°F | Ht 65.0 in | Wt 186.0 lb

## 2020-03-12 DIAGNOSIS — Z1211 Encounter for screening for malignant neoplasm of colon: Secondary | ICD-10-CM | POA: Diagnosis not present

## 2020-03-12 DIAGNOSIS — E1165 Type 2 diabetes mellitus with hyperglycemia: Secondary | ICD-10-CM | POA: Diagnosis not present

## 2020-03-12 DIAGNOSIS — E785 Hyperlipidemia, unspecified: Secondary | ICD-10-CM

## 2020-03-12 DIAGNOSIS — Z23 Encounter for immunization: Secondary | ICD-10-CM

## 2020-03-12 LAB — LIPID PANEL
Cholesterol: 154 mg/dL (ref 0–200)
HDL: 36.4 mg/dL — ABNORMAL LOW (ref >39.00)
LDL Cholesterol: 84 mg/dL (ref 0–99)
NonHDL: 117.65
Total CHOL/HDL Ratio: 4
Triglycerides: 166 mg/dL — ABNORMAL HIGH (ref 0.0–149.0)
VLDL: 33.2 mg/dL (ref 0.0–40.0)

## 2020-03-12 LAB — COMPREHENSIVE METABOLIC PANEL
ALT: 20 U/L (ref 0–53)
AST: 19 U/L (ref 0–37)
Albumin: 3.9 g/dL (ref 3.5–5.2)
Alkaline Phosphatase: 81 U/L (ref 39–117)
BUN: 19 mg/dL (ref 6–23)
CO2: 28 mEq/L (ref 19–32)
Calcium: 9.5 mg/dL (ref 8.4–10.5)
Chloride: 104 mEq/L (ref 96–112)
Creatinine, Ser: 1.4 mg/dL (ref 0.40–1.50)
GFR: 52.61 mL/min — ABNORMAL LOW (ref >60.00)
Glucose, Bld: 192 mg/dL — ABNORMAL HIGH (ref 70–99)
Potassium: 3.9 mEq/L (ref 3.5–5.1)
Sodium: 139 mEq/L (ref 135–145)
Total Bilirubin: 0.4 mg/dL (ref 0.2–1.2)
Total Protein: 7.7 g/dL (ref 6.0–8.3)

## 2020-03-12 LAB — HEMOGLOBIN A1C: Hgb A1c MFr Bld: 10.7 % — ABNORMAL HIGH (ref 4.6–6.5)

## 2020-03-12 MED ORDER — ATORVASTATIN CALCIUM 80 MG PO TABS
80.0000 mg | ORAL_TABLET | Freq: Every day | ORAL | 3 refills | Status: DC
Start: 2020-03-12 — End: 2020-04-09

## 2020-03-12 NOTE — Addendum Note (Signed)
Addended by: Marcina Millard on: 03/12/2020 04:31 PM   Modules accepted: Orders

## 2020-03-12 NOTE — Progress Notes (Signed)
[name] is a 65 y.o. male with the following history as recorded in EpicCare:  Patient Active Problem List   Diagnosis Date Noted  . COVID-19 virus infection 02/07/2019  . Diarrhea 02/07/2019  . Acute on chronic renal failure ([place]) 02/06/2019  . Routine general medical examination at a health care facility 03/12/2018  . Balanitis 10/15/2016  . NSTEMI (non-ST elevated myocardial infarction) ([place]) 11/13/2015  . PVCs (premature ventricular contractions)   . CAD (coronary artery disease)   . Acute kidney injury ([place]) 07/19/2015  . AKI (acute kidney injury) ([place]) 07/19/2015  . Hyperglycemia 07/12/2015  . Prostate cancer ([place]) 02/22/2014  . Cholelithiasis and cholecystitis without obstruction 11/16/2013  . PVC's (premature ventricular contractions) 01/07/2013  . CKD (chronic kidney disease) stage 2, GFR 60-89 ml/min 08/25/2012  . OSA (obstructive sleep apnea) 08/25/2012  . Chronic systolic heart failure ([place]) 08/06/2012  . HTN (hypertension), malignant 02/12/2011  . Non compliance w medication regimen 02/12/2011  . HYPERCHOLESTEROLEMIA 09/17/2009  . PREMATURE VENTRICULAR CONTRACTIONS 09/17/2009  . PSA, INCREASED 04/26/2009  . Diabetes type 2, uncontrolled ([place]) 04/25/2009  . CARDIOMYOPATHY 04/25/2009    Current Outpatient Medications  Medication Sig Dispense Refill  . aspirin EC 81 MG EC tablet Take 1 tablet (81 mg total) by mouth daily.    . carvedilol (COREG) 25 MG tablet TAKE 1 TABLET BY MOUTH TWICE DAILY WITH A MEAL 60 tablet 0  . clopidogrel (PLAVIX) 75 MG tablet Take 1 tablet (75 mg total) by mouth daily. Please make overdue appt with Dr. Marlou Porch before anymore refills. Thank you 1st attempt 90 tablet 3  . hydrochlorothiazide (HYDRODIURIL) 25 MG tablet Take 1 tablet (25 mg total) by mouth daily. 90 tablet 3  . insulin glargine (LANTUS SOLOSTAR) 100 UNIT/ML Solostar Pen Inject 55 Units into the skin every morning. And pen needles 1/day (Patient taking differently: Inject  55 Units into the skin daily.) 20 pen 3  . isosorbide mononitrate (IMDUR) 60 MG 24 hr tablet Take 1.5 tablets (90 mg total) by mouth daily. 135 tablet 3  . lisinopril (ZESTRIL) 40 MG tablet Take 1 tablet (40 mg total) by mouth daily. 90 tablet 3  . Multiple Vitamin (MULTIVITAMIN WITH MINERALS) TABS tablet Take 1 tablet by mouth daily.    [name] atorvastatin (LIPITOR) 80 MG tablet Take 1 tablet (80 mg total) by mouth daily. 90 tablet 3   No current facility-administered medications for this visit.    Allergies: Ambien [zolpidem tartrate] and Metformin and related  Past Medical History:  Diagnosis Date  . Asthma   . CAD (coronary artery disease)    a. non obst dz by cath 2004  b. 11/12/15 which showed no obvious large vessel CAD. No PCI or clear reason for NSTEMI ( possibly hypertensive urgency?).   [name] CARDIOMYOPATHY    a. NICM, LVEF 30% 01/2011 echo; 20-25% 07/2012 echo, EF recovered by echo 06/2015.  . Congestive heart failure (CHF) ([place])   . DIABETES MELLITUS, TYPE II   . HYPERCHOLESTEROLEMIA   . HYPERTENSION   . Myocardial infarct ([place]) 10/2015  . Noncompliance   . Prostate cancer [facility])    prostate cancer  . PVCs (premature ventricular contractions)    a. s/p ablation 01-07-2013 by Dr Rayann Heman    Past Surgical History:  Procedure Laterality Date  . ABLATION  01-07-2013   RVOT PVC's ablated by Dr Rayann Heman along anteroseptal RVOT  . CARDIAC CATHETERIZATION  2004   nonobst dz  . CARDIAC CATHETERIZATION N/A 11/12/2015   Procedure:  Left Heart Cath and Coronary Angiography;  Surgeon: Sherren Mocha, MD;  Location: [facility] LAB;  Service: Cardiovascular;  Laterality: N/A;  . CHOLECYSTECTOMY N/A 11/16/2013   Procedure: LAPAROSCOPIC CHOLECYSTECTOMY WITH INTRAOPERATIVE CHOLANGIOGRAM;  Surgeon: [name], MD;  Location: [facility];  Service: General;  Laterality: N/A;  . LYMPHADENECTOMY Bilateral 02/22/2014   Procedure: PELVIC LYMPH NODE DISSECTION;  Surgeon: [name], MD;  Location:  [facility];  Service: Urology;  Laterality: Bilateral;  . PROSTATE SURGERY    . ROBOT ASSISTED LAPAROSCOPIC RADICAL PROSTATECTOMY N/A 02/22/2014   Procedure: ROBOTIC ASSISTED LAPAROSCOPIC RADICAL PROSTATECTOMY WITH INDOCYANINE GREEN DYE AND OPEN UMBILICAL HERNIA REPAIR;  Surgeon: [name], MD;  [facility];  Service: Urology;  Laterality: N/A;  . SUPRAVENTRICULAR TACHYCARDIA ABLATION N/A 09/28/2012   Procedure: Tanna Furry Ablation;  Surgeon: [name], MD;  Location: [facility] CATH LAB;  Service: Cardiovascular;  Laterality: N/A;  . SURGERY SCROTAL / TESTICULAR     at age 62  . V-TACH ABLATION N/A 01/07/2013   Procedure: V-TACH ABLATION;  Surgeon: [name], MD;  Location: [facility] CATH LAB;  Service: Cardiovascular;  Laterality: N/A;    Family History  Problem Relation Age of Onset  . Hypertension Mother   . Peripheral vascular disease Mother   . Diabetes Mother   . Peripheral vascular disease Other   . Hypertension Other   . Prostate cancer Other   . Prostate cancer Brother   . Lung cancer Brother   . Heart attack Paternal Grandfather   . Heart failure Maternal Grandmother   . Diabetes Son   . Colon cancer Sister   . Prostate cancer Brother   . Prostate cancer Brother   . Prostate cancer Brother   . Pancreatic cancer Paternal Aunt   . Esophageal cancer Neg Hx     Social History   Tobacco Use  . Smoking status: Never Smoker  . Smokeless tobacco: Never Used  . 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 Alcohol use: Yes    Comment: rare    Subjective:   Patient last seen in our office in July 2020; needs OV for refill on Atorvastatin; majority of healthcare needs are managed by his cardiologist; sees endocrinology for Type 2 Diabetes- was due there in July 2021- did not follow-up as requested; No acute concerns today;   Objective:  Vitals:   03/12/20 1012  BP: 132/82  Pulse: 82  Temp: 98.3 F (36.8 C)  TempSrc: Oral   SpO2: 98%  Weight: 186 lb (84.4 kg)  Height: '5\' 5"'  (1.651 m)    General: Well developed, well nourished, in no acute distress  Skin : Warm and dry.  Head: Normocephalic and atraumatic  Eyes: Sclera and conjunctiva clear; pupils round and reactive to light; extraocular movements intact  Ears: External normal; canals clear; tympanic membranes normal  Oropharynx: Pink, supple. No suspicious lesions  Neck: Supple without thyromegaly, adenopathy  Lungs: Respirations unlabored; clear to auscultation bilaterally without wheeze, rales, rhonchi  CVS exam: normal rate and regular rhythm.  Neurologic: Alert and oriented; speech intact; face symmetrical; moves all extremities well; CNII-XII intact without focal deficit   Assessment:  1. Hyperlipidemia, unspecified hyperlipidemia type   2. Uncontrolled type 2 diabetes mellitus with hyperglycemia ([place])   3. Encounter for screening colonoscopy     Plan:  1. Check CMP, lipid panel today; refill updated; 2. Check Hgba1c; stressed need to see his endocrinologist; 3. Colonoscopy due  in May- order updated;  Tdap given; encouraged to see his eye doctor as well;  This visit occurred during the SARS-CoV-2 public health emergency.  Safety protocols were in place, including screening questions prior to the visit, additional usage of staff PPE, and extensive cleaning of exam room while observing appropriate contact time as indicated for disinfecting solutions.      No follow-ups on file.  Orders Placed This Encounter  Procedures  . Comp Met (CMET)    Standing Status:   Future    Number of Occurrences:   1    Standing Expiration Date:   03/12/2021  . Lipid panel    Standing Status:   Future    Number of Occurrences:   1    Standing Expiration Date:   03/12/2021  . Hemoglobin A1c    Standing Status:   Future    Number of Occurrences:   1    Standing Expiration Date:   03/12/2021  . Ambulatory referral to Gastroenterology    Referral Priority:    Routine    Referral Type:   Consultation    Referral Reason:   Specialty Services Required    Number of Visits Requested:   1    Requested Prescriptions   Signed Prescriptions Disp Refills  . atorvastatin (LIPITOR) 80 MG tablet 90 tablet 3    Sig: Take 1 tablet (80 mg total) by mouth daily.

## 2020-03-12 NOTE — Patient Instructions (Signed)
Please schedule a follow up with Dr. Loanne Drilling for your diabetes check up;

## 2020-04-09 ENCOUNTER — Other Ambulatory Visit: Payer: Self-pay | Admitting: Family

## 2020-04-21 ENCOUNTER — Encounter (HOSPITAL_COMMUNITY): Payer: Self-pay | Admitting: Internal Medicine

## 2020-04-21 ENCOUNTER — Other Ambulatory Visit: Payer: Self-pay

## 2020-04-21 ENCOUNTER — Observation Stay (HOSPITAL_COMMUNITY)
Admission: EM | Admit: 2020-04-21 | Discharge: 2020-04-22 | Disposition: A | Discharge status: Home / Self Care | Payer: BC Managed Care – PPO | Attending: Internal Medicine | Admitting: Internal Medicine

## 2020-04-21 ENCOUNTER — Observation Stay (HOSPITAL_BASED_OUTPATIENT_CLINIC_OR_DEPARTMENT_OTHER): Payer: BC Managed Care – PPO

## 2020-04-21 DIAGNOSIS — I2510 Atherosclerotic heart disease of native coronary artery without angina pectoris: Secondary | ICD-10-CM | POA: Insufficient documentation

## 2020-04-21 DIAGNOSIS — I252 Old myocardial infarction: Secondary | ICD-10-CM | POA: Insufficient documentation

## 2020-04-21 DIAGNOSIS — Z7982 Long term (current) use of aspirin: Secondary | ICD-10-CM | POA: Insufficient documentation

## 2020-04-21 DIAGNOSIS — Z79899 Other long term (current) drug therapy: Secondary | ICD-10-CM | POA: Diagnosis not present

## 2020-04-21 DIAGNOSIS — I130 Hypertensive heart and chronic kidney disease with heart failure and stage 1 through stage 4 chronic kidney disease, or unspecified chronic kidney disease: Secondary | ICD-10-CM | POA: Diagnosis not present

## 2020-04-21 DIAGNOSIS — E1165 Type 2 diabetes mellitus with hyperglycemia: Secondary | ICD-10-CM | POA: Diagnosis not present

## 2020-04-21 DIAGNOSIS — J45909 Unspecified asthma, uncomplicated: Secondary | ICD-10-CM | POA: Insufficient documentation

## 2020-04-21 DIAGNOSIS — N183 Chronic kidney disease, stage 3 unspecified: Secondary | ICD-10-CM | POA: Insufficient documentation

## 2020-04-21 DIAGNOSIS — R55 Syncope and collapse: Secondary | ICD-10-CM | POA: Diagnosis not present

## 2020-04-21 DIAGNOSIS — E1122 Type 2 diabetes mellitus with diabetic chronic kidney disease: Secondary | ICD-10-CM | POA: Insufficient documentation

## 2020-04-21 DIAGNOSIS — I5032 Chronic diastolic (congestive) heart failure: Secondary | ICD-10-CM | POA: Diagnosis not present

## 2020-04-21 DIAGNOSIS — Z20822 Contact with and (suspected) exposure to covid-19: Secondary | ICD-10-CM | POA: Diagnosis not present

## 2020-04-21 DIAGNOSIS — Z794 Long term (current) use of insulin: Secondary | ICD-10-CM | POA: Diagnosis not present

## 2020-04-21 DIAGNOSIS — Z8546 Personal history of malignant neoplasm of prostate: Secondary | ICD-10-CM | POA: Insufficient documentation

## 2020-04-21 LAB — ECHOCARDIOGRAM COMPLETE
Area-P 1/2: 2.73 cm2
Height: 65 in
S' Lateral: 3.6 cm
Single Plane A2C EF: 41.4 %
Weight: 2976 oz

## 2020-04-21 LAB — CBC
HCT: 36.7 % — ABNORMAL LOW (ref 39.0–52.0)
Hemoglobin: 12.4 g/dL — ABNORMAL LOW (ref 13.0–17.0)
MCH: 30.2 pg (ref 26.0–34.0)
MCHC: 33.8 g/dL (ref 30.0–36.0)
MCV: 89.3 fL (ref 80.0–100.0)
Platelets: 287 10*3/uL (ref 150–400)
RBC: 4.11 MIL/uL — ABNORMAL LOW (ref 4.22–5.81)
RDW: 12.6 % (ref 11.5–15.5)
WBC: 7.1 10*3/uL (ref 4.0–10.5)
nRBC: 0 % (ref 0.0–0.2)

## 2020-04-21 LAB — BASIC METABOLIC PANEL
Anion gap: 7 (ref 5–15)
BUN: 25 mg/dL — ABNORMAL HIGH (ref 8–23)
CO2: 23 mmol/L (ref 22–32)
Calcium: 8.8 mg/dL — ABNORMAL LOW (ref 8.9–10.3)
Chloride: 106 mmol/L (ref 98–111)
Creatinine, Ser: 2.07 mg/dL — ABNORMAL HIGH (ref 0.61–1.24)
GFR, Estimated: 35 mL/min — ABNORMAL LOW (ref >60)
Glucose, Bld: 313 mg/dL — ABNORMAL HIGH (ref 70–99)
Potassium: 4.9 mmol/L (ref 3.5–5.1)
Sodium: 136 mmol/L (ref 135–145)

## 2020-04-21 LAB — URINALYSIS, ROUTINE W REFLEX MICROSCOPIC
Bacteria, UA: NONE SEEN
Bilirubin Urine: NEGATIVE
Glucose, UA: 50 mg/dL — AB
Hgb urine dipstick: NEGATIVE
Ketones, ur: NEGATIVE mg/dL
Leukocytes,Ua: NEGATIVE
Nitrite: NEGATIVE
Protein, ur: NEGATIVE mg/dL
Specific Gravity, Urine: 1.028 (ref 1.005–1.030)
pH: 5 (ref 5.0–8.0)

## 2020-04-21 LAB — TROPONIN I (HIGH SENSITIVITY)
Troponin I (High Sensitivity): 10 ng/L (ref <18)
Troponin I (High Sensitivity): 12 ng/L (ref <18)

## 2020-04-21 LAB — HIV ANTIBODY (ROUTINE TESTING W REFLEX): HIV Screen 4th Generation wRfx: NONREACTIVE

## 2020-04-21 LAB — TSH: TSH: 0.372 u[IU]/mL (ref 0.350–4.500)

## 2020-04-21 LAB — SARS CORONAVIRUS 2 (TAT 6-24 HRS): SARS Coronavirus 2: NEGATIVE

## 2020-04-21 LAB — GLUCOSE, CAPILLARY: Glucose-Capillary: 263 mg/dL — ABNORMAL HIGH (ref 70–99)

## 2020-04-21 LAB — CBG MONITORING, ED
Glucose-Capillary: 290 mg/dL — ABNORMAL HIGH (ref 70–99)
Glucose-Capillary: 200 mg/dL — ABNORMAL HIGH (ref 70–99)

## 2020-04-21 MED ORDER — ASPIRIN EC 81 MG PO TBEC
81.0000 mg | DELAYED_RELEASE_TABLET | Freq: Every day | ORAL | Status: DC
Start: 2020-04-21 — End: 2020-04-22
  Administered 2020-04-22: 81 mg via ORAL
  Filled 2020-04-21: qty 1

## 2020-04-21 MED ORDER — LISINOPRIL 40 MG PO TABS: 40.0000 mg | ORAL_TABLET | Freq: Every day | ORAL | Status: DC

## 2020-04-21 MED ORDER — HYDROCHLOROTHIAZIDE 25 MG PO TABS: 25.0000 mg | ORAL_TABLET | Freq: Every day | ORAL | Status: DC

## 2020-04-21 MED ORDER — ATORVASTATIN CALCIUM 80 MG PO TABS
80.0000 mg | ORAL_TABLET | Freq: Every day | ORAL | Status: DC
  Administered 2020-04-22: 80 mg via ORAL
  Filled 2020-04-21: qty 1

## 2020-04-21 MED ORDER — SODIUM CHLORIDE 0.9% FLUSH
3.0000 mL | Freq: Two times a day (BID) | INTRAVENOUS | Status: DC
  Administered 2020-04-21: 3 mL via INTRAVENOUS

## 2020-04-21 MED ORDER — CLOPIDOGREL BISULFATE 75 MG PO TABS
75.0000 mg | ORAL_TABLET | Freq: Every day | ORAL | Status: DC
  Administered 2020-04-22: 75 mg via ORAL
  Filled 2020-04-21: qty 1

## 2020-04-21 MED ORDER — INSULIN ASPART 100 UNIT/ML ~~LOC~~ SOLN
0.0000 [IU] | Freq: Three times a day (TID) | SUBCUTANEOUS | Status: DC
  Administered 2020-04-21 – 2020-04-22 (×3): 3 [IU] via SUBCUTANEOUS

## 2020-04-21 MED ORDER — INSULIN GLARGINE 100 UNIT/ML ~~LOC~~ SOLN
55.0000 [IU] | Freq: Every day | SUBCUTANEOUS | Status: DC
  Administered 2020-04-22: 55 [IU] via SUBCUTANEOUS
  Filled 2020-04-21: qty 0.55

## 2020-04-21 MED ORDER — ENOXAPARIN SODIUM 30 MG/0.3ML ~~LOC~~ SOLN: 30.0000 mg | SUBCUTANEOUS | Status: DC

## 2020-04-21 MED ORDER — ISOSORBIDE MONONITRATE ER 60 MG PO TB24
90.0000 mg | ORAL_TABLET | Freq: Every day | ORAL | Status: DC
  Administered 2020-04-22: 90 mg via ORAL
  Filled 2020-04-21: qty 1

## 2020-04-21 MED ORDER — ENOXAPARIN SODIUM 40 MG/0.4ML ~~LOC~~ SOLN: 40.0000 mg | SUBCUTANEOUS | Status: DC

## 2020-04-21 MED ORDER — CARVEDILOL 12.5 MG PO TABS
12.5000 mg | ORAL_TABLET | Freq: Two times a day (BID) | ORAL | Status: DC
  Administered 2020-04-21 – 2020-04-22 (×2): 12.5 mg via ORAL
  Filled 2020-04-21: qty 4
  Filled 2020-04-21: qty 1

## 2020-04-21 MED ORDER — HYDRALAZINE HCL 25 MG PO TABS: 25.0000 mg | ORAL_TABLET | Freq: Four times a day (QID) | ORAL | Status: DC | PRN

## 2020-04-21 MED ORDER — SODIUM CHLORIDE 0.9 % IV BOLUS
1000.0000 mL | Freq: Once | INTRAVENOUS | Status: AC
  Administered 2020-04-21: 1000 mL via INTRAVENOUS

## 2020-04-21 NOTE — ED Provider Notes (Signed)
[place] EMERGENCY DEPARTMENT Provider Note   CSN: [id number] Arrival date & time: 04/21/20  1127     History Chief Complaint  Patient presents with  . Loss of Consciousness    [name] is a 65 y.o. male.  HPI   Pt is a 65 y/o male with a h/o asthma, CAD, cardiomyopathy, CHF, DM, HLD, HTN, MI, prostate CA, who presents to the ED today for eval of a syncopal episode. States that he had vision changes, felt flushed, had palpitations, and then had a syncopal episode. This episode occurred while sitting in the car. He did not fall to the ground or injury himself. He denies any persistent vision changes since the episode and is only c/o "feeling tired". Denies chest pain, shortness of breath, NV  Past Medical History:  Diagnosis Date  . Asthma   . CAD (coronary artery disease)    a. non obst dz by cath 2004  b. 11/12/15 which showed no obvious large vessel CAD. No PCI or clear reason for NSTEMI ( possibly hypertensive urgency?).   [name] CARDIOMYOPATHY    a. NICM, LVEF 30% 01/2011 echo; 20-25% 07/2012 echo, EF recovered by echo 06/2015.  . Congestive heart failure (CHF) ([place])   . DIABETES MELLITUS, TYPE II   . HYPERCHOLESTEROLEMIA   . HYPERTENSION   . Myocardial infarct ([place]) 10/2015  . Noncompliance   . Prostate cancer [facility])    prostate cancer  . PVCs (premature ventricular contractions)    a. s/p ablation 01-07-2013 by Dr Rayann Heman    Patient Active Problem List   Diagnosis Date Noted  . Syncope, vasovagal 04/21/2020  . Syncope 04/21/2020  . COVID-19 virus infection 02/07/2019  . Diarrhea 02/07/2019  . Acute on chronic renal failure ([place]) 02/06/2019  . Routine general medical examination at a health care facility 03/12/2018  . Balanitis 10/15/2016  . NSTEMI (non-ST elevated myocardial infarction) ([place]) 11/13/2015  . PVCs (premature ventricular contractions)   . CAD (coronary artery disease)   . Acute kidney injury ([place]) 07/19/2015  . AKI (acute  kidney injury) ([place]) 07/19/2015  . Hyperglycemia 07/12/2015  . Prostate cancer ([place]) 02/22/2014  . Cholelithiasis and cholecystitis without obstruction 11/16/2013  . PVC's (premature ventricular contractions) 01/07/2013  . CKD (chronic kidney disease) stage 2, GFR 60-89 ml/min 08/25/2012  . OSA (obstructive sleep apnea) 08/25/2012  . Chronic systolic heart failure ([place]) 08/06/2012  . HTN (hypertension), malignant 02/12/2011  . Non compliance w medication regimen 02/12/2011  . HYPERCHOLESTEROLEMIA 09/17/2009  . PREMATURE VENTRICULAR CONTRACTIONS 09/17/2009  . PSA, INCREASED 04/26/2009  . Diabetes type 2, uncontrolled ([place]) 04/25/2009  . CARDIOMYOPATHY 04/25/2009    Past Surgical History:  Procedure Laterality Date  . ABLATION  01-07-2013   RVOT PVC's ablated by Dr Rayann Heman along anteroseptal RVOT  . CARDIAC CATHETERIZATION  2004   nonobst dz  . CARDIAC CATHETERIZATION N/A 11/12/2015   Procedure: Left Heart Cath and Coronary Angiography;  Surgeon: Sherren Mocha, MD;  Location: [place] CV LAB;  Service: Cardiovascular;  Laterality: N/A;  . CHOLECYSTECTOMY N/A 11/16/2013   Procedure: LAPAROSCOPIC CHOLECYSTECTOMY WITH INTRAOPERATIVE CHOLANGIOGRAM;  Surgeon: [name], MD;  Location: [facility] ORS;  Service: General;  Laterality: N/A;  . LYMPHADENECTOMY Bilateral 02/22/2014   Procedure: PELVIC LYMPH NODE DISSECTION;  Surgeon: [name], MD;  Location: [facility] ORS;  Service: Urology;  Laterality: Bilateral;  . PROSTATE SURGERY    . ROBOT ASSISTED LAPAROSCOPIC RADICAL PROSTATECTOMY N/A 02/22/2014   Procedure: ROBOTIC ASSISTED LAPAROSCOPIC RADICAL PROSTATECTOMY WITH  INDOCYANINE GREEN DYE AND OPEN UMBILICAL HERNIA REPAIR;  Surgeon: [name], MD;  Location: [facility] ORS;  Service: Urology;  Laterality: N/A;  . SUPRAVENTRICULAR TACHYCARDIA ABLATION N/A 09/28/2012   Procedure: Tanna Furry Ablation;  Surgeon: [name], MD;  Location: [facility] CATH LAB;  Service: Cardiovascular;  Laterality: N/A;  .  SURGERY SCROTAL / TESTICULAR     at age 76  . V-TACH ABLATION N/A 01/07/2013   Procedure: V-TACH ABLATION;  Surgeon: [name], MD;  Location: [place] CATH LAB;  Service: Cardiovascular;  Laterality: N/A;       Family History  Problem Relation Age of Onset  . Hypertension Mother   . Peripheral vascular disease Mother   . Diabetes Mother   . Peripheral vascular disease Other   . Hypertension Other   . Prostate cancer Other   . Prostate cancer Brother   . Lung cancer Brother   . Heart attack Paternal Grandfather   . Heart failure Maternal Grandmother   . Diabetes Son   . Colon cancer Sister   . Prostate cancer Brother   . Prostate cancer Brother   . Prostate cancer Brother   . Pancreatic cancer Paternal Aunt   . Esophageal cancer Neg Hx     Social History   Tobacco Use  . Smoking status: Never Smoker  . Smokeless tobacco: Never Used  . 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 Vaping Use: Never used  Substance Use Topics  . Alcohol use: Yes    Comment: rare  . Drug use: No    Home Medications Prior to Admission medications   Medication Sig Start Date End Date Taking? Authorizing Provider  aspirin EC 81 MG EC tablet Take 1 tablet (81 mg total) by mouth daily. 11/14/15   Eileen Stanford, PA-C  atorvastatin (LIPITOR) 80 MG tablet Take 1 tablet by mouth once daily 04/09/20   Marrian Salvage, FNP  carvedilol (COREG) 25 MG tablet TAKE 1 TABLET BY MOUTH TWICE DAILY WITH A MEAL 03/13/20   Minus Breeding, MD  clopidogrel (PLAVIX) 75 MG tablet Take 1 tablet (75 mg total) by mouth daily. Please make overdue appt with Dr. Marlou Porch before anymore refills. Thank you 1st attempt 01/16/20   Jerline Pain, MD  hydrochlorothiazide (HYDRODIURIL) 25 MG tablet Take 1 tablet (25 mg total) by mouth daily. 02/10/20   Jerline Pain, MD  insulin glargine (LANTUS SOLOSTAR) 100 UNIT/ML Solostar Pen Inject 55 Units into the skin every morning.  And pen needles 1/day Patient taking differently: Inject 55 Units into the skin daily. 06/02/19   Renato Shin, MD  isosorbide mononitrate (IMDUR) 60 MG 24 hr tablet Take 1.5 tablets (90 mg total) by mouth daily. 01/30/20   Jerline Pain, MD  lisinopril (ZESTRIL) 40 MG tablet Take 1 tablet (40 mg total) by mouth daily. 01/16/20   Jerline Pain, MD  Multiple Vitamin (MULTIVITAMIN WITH MINERALS) TABS tablet Take 1 tablet by mouth daily. 02/10/19   Hongalgi, Lenis Dickinson, MD    Allergies    Ambien [zolpidem tartrate] and Metformin and related  Review of Systems   Review of Systems  Constitutional: Positive for diaphoresis (feeling flushed/hot). Negative for chills and fever.  HENT: Negative for ear pain and sore throat.   Eyes: Positive for visual disturbance.  Respiratory: Negative for cough and shortness of breath.   Cardiovascular: Positive for palpitations. Negative for chest pain.  Gastrointestinal: Negative for abdominal pain, constipation, diarrhea,  nausea and vomiting.  Genitourinary: Negative for dysuria and hematuria.  Musculoskeletal: Negative for back pain.  Skin: Negative for rash.  Neurological: Positive for syncope. Negative for seizures.  All other systems reviewed and are negative.   Physical Exam Updated Vital Signs BP 125/77   Pulse 70   Temp 97.7 F (36.5 C) (Oral)   Resp 19   Ht 5\' 5"  (1.651 m)   Wt 84.4 kg   SpO2 99%   BMI 30.95 kg/m   Physical Exam Vitals and nursing note reviewed.  Constitutional:      Appearance: He is well-developed.  HENT:     Head: Normocephalic and atraumatic.     Mouth/Throat:     Mouth: Mucous membranes are dry.  Eyes:     Conjunctiva/sclera: Conjunctivae normal.  Cardiovascular:     Rate and Rhythm: Normal rate and regular rhythm.     Heart sounds: Normal heart sounds. No murmur heard.   Pulmonary:     Effort: Pulmonary effort is normal. No respiratory distress.     Breath sounds: Normal breath sounds. No wheezing, rhonchi  or rales.  Abdominal:     Palpations: Abdomen is soft.     Tenderness: There is no abdominal tenderness.  Musculoskeletal:     Cervical back: Neck supple.     Right lower leg: No edema.     Left lower leg: No edema.  Skin:    General: Skin is warm and dry.  Neurological:     Mental Status: He is alert.     ED Results / Procedures / Treatments   Labs (all labs ordered are listed, but only abnormal results are displayed) Labs Reviewed  BASIC METABOLIC PANEL - Abnormal; Notable for the following components:      Result Value   Glucose, Bld 313 (*)    BUN 25 (*)    Creatinine, Ser 2.07 (*)    Calcium 8.8 (*)    GFR, Estimated 35 (*)    All other components within normal limits  CBC - Abnormal; Notable for the following components:   RBC 4.11 (*)    Hemoglobin 12.4 (*)    HCT 36.7 (*)    All other components within normal limits  URINALYSIS, ROUTINE W REFLEX MICROSCOPIC - Abnormal; Notable for the following components:   Color, Urine AMBER (*)    APPearance HAZY (*)    Glucose, UA 50 (*)    All other components within normal limits  CBG MONITORING, ED - Abnormal; Notable for the following components:   Glucose-Capillary 290 (*)    All other components within normal limits  HIV ANTIBODY (ROUTINE TESTING W REFLEX)  TSH  CBG MONITORING, ED  TROPONIN I (HIGH SENSITIVITY)    EKG EKG Interpretation  Date/Time:  Saturday April 21 2020 11:35:37 EDT Ventricular Rate:  70 PR Interval:    QRS Duration: 124 QT Interval:  408 QTC Calculation: 441 R Axis:   -51 Text Interpretation: Sinus rhythm Left bundle branch block since last tracing no significant change Confirmed by Daleen Bo [phone]) on 04/21/2020 12:05:31 PM   Radiology No results found.  Procedures Procedures   Medications Ordered in ED Medications  aspirin EC tablet 81 mg (has no administration in time range)  atorvastatin (LIPITOR) tablet 80 mg (has no administration in time range)  carvedilol (COREG)  tablet 12.5 mg (has no administration in time range)  hydrochlorothiazide (HYDRODIURIL) tablet 25 mg (has no administration in time range)  isosorbide mononitrate (IMDUR) 24 hr tablet  90 mg (has no administration in time range)  lisinopril (ZESTRIL) tablet 40 mg (has no administration in time range)  insulin glargine (LANTUS) Solostar Pen 55 Units (has no administration in time range)  clopidogrel (PLAVIX) tablet 75 mg (has no administration in time range)  sodium chloride flush (NS) 0.9 % injection 3 mL (has no administration in time range)  enoxaparin (LOVENOX) injection 30 mg (has no administration in time range)  hydrALAZINE (APRESOLINE) tablet 25 mg (has no administration in time range)  insulin aspart (novoLOG) injection 0-15 Units (has no administration in time range)  sodium chloride 0.9 % bolus 1,000 mL (0 mLs Intravenous Stopped 04/21/20 1430)    ED Course  I have reviewed the triage vital signs and the nursing notes.  Pertinent labs & imaging results that were available during my care of the patient were reviewed by me and considered in my medical decision making (see chart for details).    MDM Rules/Calculators/A&P                          65 year old male presenting the emergency department today for evaluation of a syncopal episode.  Was at rest when this occurred and had preceding visual changes and sweatiness and palpitations.  Was hypotensive on EMS arrival. No sxs to suggest infection. ivf given and bp improved  Reviewed/interpreted labs CBC with anemia at baseline BMP with hyperglycemia, elevate bun/cr at baseline, otherwise unremarkable UA neg  EKG unchanged from prior   Pt with hx of heart failure with syncopal episode today. He has h/o vasovagal syncope in the past but sxs occurred at rest today and there is concern for possible arrhythmia.   2:53 PM CONSULT with Dr. Stanford Breed of cardiology who states he would need to see pt to make recommendations. Appreciate  cardiology involvement in patients care.    3:05 PM CONSULT with Dr. Roosevelt Locks who accepts patient for admission   Final Clinical Impression(s) / ED Diagnoses Final diagnoses:  Syncope, unspecified syncope type    Rx / DC Orders ED Discharge Orders    None       Bishop Dublin 04/21/20 1527    Daleen Bo, MD 04/21/20 [phone]

## 2020-04-21 NOTE — ED Triage Notes (Addendum)
[facility] for LOC per ems pt had vision changes and went unresponsive with family witness. Denies head pain, no sz history. Pt found to be hypotensive with ems, 90/60, received 250 ml NS bolus. Denies n/v/d.

## 2020-04-21 NOTE — ED Notes (Signed)
Pt has a ready bed    A tech just arrived to do a echo  Delayed to go to the floor

## 2020-04-21 NOTE — H&P (Signed)
History and Physical    [name]:[id number] DOB: 11-21-1955 DOA: 04/21/2020  PCP: Marrian Salvage, FNP (Confirm with patient/family/NH records and if not entered, this has to be entered at [facility] point of entry) Patient coming from: Home  I have personally briefly reviewed patient's old medical records in [place]  Chief Complaint: I passed out  HPI: [name] is a 65 y.o. male with medical history significant of refractory HTN on multiple BP meds, chronic diastolic CHF, CAD, IDDM, CKD stage III, history of PVCs status post ablation, presented with syncope.  Patient started to feel lightheadedness this morning after bowel movement.  Then while was driving himself with daughter on the passenger seat, he started to feel "all brightness in my sight", and then started to feel lightheaded and blurry vision, he was able to pull the car and speak with her daughter.  Right after that while daughter was driving, patient started to feel and "warmness on my chest and neck" denies any palpitations, no no chest pains and no nauseous vomiting.  Told her on the passenger side looked and found the patient appeared to be pale with some lips twitching movement.  Daughter then found patient's conscious level waxing waning, and she estimated total time of LOC less than 1 minute.  To report over the current call EMS.  EMS arrived and found patient awake alert. ED Course: Systolic blood pressure in the low 90s, patient was given 1 L of IV bolus and blood pressure improved to 120s and mentation at baseline.  EKG showed chronic LBBB.  Review of Systems: As per HPI otherwise 14 point review of systems negative.    Past Medical History:  Diagnosis Date  . Asthma   . CAD (coronary artery disease)    a. non obst dz by cath 2004  b. 11/12/15 which showed no obvious large vessel CAD. No PCI or clear reason for NSTEMI ( possibly hypertensive urgency?).   [name] CARDIOMYOPATHY    a. NICM, LVEF  30% 01/2011 echo; 20-25% 07/2012 echo, EF recovered by echo 06/2015.  . Congestive heart failure (CHF) ([place])   . DIABETES MELLITUS, TYPE II   . HYPERCHOLESTEROLEMIA   . HYPERTENSION   . Myocardial infarct ([place]) 10/2015  . Noncompliance   . Prostate cancer [facility])    prostate cancer  . PVCs (premature ventricular contractions)    a. s/p ablation 01-07-2013 by Dr Rayann Heman    Past Surgical History:  Procedure Laterality Date  . ABLATION  01-07-2013   RVOT PVC's ablated by Dr Rayann Heman along anteroseptal RVOT  . CARDIAC CATHETERIZATION  2004   nonobst dz  . CARDIAC CATHETERIZATION N/A 11/12/2015   Procedure: Left Heart Cath and Coronary Angiography;  Surgeon: Sherren Mocha, MD;  Location: [place] CV LAB;  Service: Cardiovascular;  Laterality: N/A;  . CHOLECYSTECTOMY N/A 11/16/2013   Procedure: LAPAROSCOPIC CHOLECYSTECTOMY WITH INTRAOPERATIVE CHOLANGIOGRAM;  Surgeon: [name], MD;  [facility];  Service: General;  Laterality: N/A;  . LYMPHADENECTOMY Bilateral 02/22/2014   Procedure: PELVIC LYMPH NODE DISSECTION;  Surgeon: [name], MD;  Location[facility] ORS;  Service: Urology;  Laterality: Bilateral;  . PROSTATE SURGERY    . ROBOT ASSISTED LAPAROSCOPIC RADICAL PROSTATECTOMY N/A 02/22/2014   Procedure: ROBOTIC ASSISTED LAPAROSCOPIC RADICAL PROSTATECTOMY WITH INDOCYANINE GREEN DYE AND OPEN UMBILICAL HERNIA REPAIR;  Surgeon: [name], MD;  [facility];  Service: Urology;  Laterality: N/A;  . SUPRAVENTRICULAR TACHYCARDIA ABLATION N/A 09/28/2012   Procedure: [place]ach Ablation;  Surgeon: [name], MD;  Location: [facility] CATH LAB;  Service: Cardiovascular;  Laterality: N/A;  . SURGERY SCROTAL / TESTICULAR     at age 40  . V-TACH ABLATION N/A 01/07/2013   Procedure: V-TACH ABLATION;  Surgeon: [name], MD;  Location: [place] CATH LAB;  Service: Cardiovascular;  Laterality: N/A;     reports that he has never smoked. He has never used smokeless tobacco. He reports current alcohol  use. He reports that he does not use drugs.  Allergies  Allergen Reactions  . Ambien [Zolpidem Tartrate] Other (See Comments)    Pt states that this medication made him go crazy.   . Metformin And Related Diarrhea    Family History  Problem Relation Age of Onset  . Hypertension Mother   . Peripheral vascular disease Mother   . Diabetes Mother   . Peripheral vascular disease Other   . Hypertension Other   . Prostate cancer Other   . Prostate cancer Brother   . Lung cancer Brother   . Heart attack Paternal Grandfather   . Heart failure Maternal Grandmother   . Diabetes Son   . Colon cancer Sister   . Prostate cancer Brother   . Prostate cancer Brother   . Prostate cancer Brother   . Pancreatic cancer Paternal Aunt   . Esophageal cancer Neg Hx      Prior to Admission medications   Medication Sig Start Date End Date Taking? Authorizing Provider  aspirin EC 81 MG EC tablet Take 1 tablet (81 mg total) by mouth daily. 11/14/15   Eileen Stanford, PA-C  atorvastatin (LIPITOR) 80 MG tablet Take 1 tablet by mouth once daily 04/09/20   Marrian Salvage, FNP  carvedilol (COREG) 25 MG tablet TAKE 1 TABLET BY MOUTH TWICE DAILY WITH A MEAL 03/13/20   Minus Breeding, MD  clopidogrel (PLAVIX) 75 MG tablet Take 1 tablet (75 mg total) by mouth daily. Please make overdue appt with Dr. Marlou Porch before anymore refills. Thank you 1st attempt 01/16/20   Jerline Pain, MD  hydrochlorothiazide (HYDRODIURIL) 25 MG tablet Take 1 tablet (25 mg total) by mouth daily. 02/10/20   Jerline Pain, MD  insulin glargine (LANTUS SOLOSTAR) 100 UNIT/ML Solostar Pen Inject 55 Units into the skin every morning. And pen needles 1/day Patient taking differently: Inject 55 Units into the skin daily. 06/02/19   Renato Shin, MD  isosorbide mononitrate (IMDUR) 60 MG 24 hr tablet Take 1.5 tablets (90 mg total) by mouth daily. 01/30/20   Jerline Pain, MD  lisinopril (ZESTRIL) 40 MG tablet Take 1 tablet (40 mg total)  by mouth daily. 01/16/20   Jerline Pain, MD  Multiple Vitamin (MULTIVITAMIN WITH MINERALS) TABS tablet Take 1 tablet by mouth daily. 02/10/19   Modena Jansky, MD    Physical Exam: Vitals:   04/21/20 1321 04/21/20 1400 04/21/20 1415 04/21/20 1500  BP: 111/77 123/75 122/90 125/77  Pulse: 67 65 61 70  Resp: 16 15 14 19   Temp:      TempSrc:      SpO2: 97% 97% 98% 99%  Weight:      Height:        Constitutional: NAD, calm, comfortable Vitals:   04/21/20 1321 04/21/20 1400 04/21/20 1415 04/21/20 1500  BP: 111/77 123/75 122/90 125/77  Pulse: 67 65 61 70  Resp: 16 15 14 19   Temp:      TempSrc:      SpO2: 97% 97% 98% 99%  Weight:      Height:       Eyes: PERRL, lids and conjunctivae normal ENMT: Mucous membranes are moist. Posterior pharynx clear of any exudate or lesions.Normal dentition.  Neck: normal, supple, no masses, no thyromegaly Respiratory: clear to auscultation bilaterally, no wheezing, no crackles. Normal respiratory effort. No accessory muscle use.  Cardiovascular: Regular rate and rhythm, no murmurs / rubs / gallops. No extremity edema. 2+ pedal pulses. No carotid bruits.  Abdomen: no tenderness, no masses palpated. No hepatosplenomegaly. Bowel sounds positive.  Musculoskeletal: no clubbing / cyanosis. No joint deformity upper and lower extremities. Good ROM, no contractures. Normal muscle tone.  Skin: no rashes, lesions, ulcers. No induration Neurologic: CN 2-12 grossly intact. Sensation intact, DTR normal. Strength 5/5 in all 4.  Psychiatric: Normal judgment and insight. Alert and oriented x 3. Normal mood.     Labs on Admission: I have personally reviewed following labs and imaging studies  CBC: Recent Labs  Lab 04/21/20 1138  WBC 7.1  HGB 12.4*  HCT 36.7*  MCV 89.3  PLT 846   Basic Metabolic Panel: Recent Labs  Lab 04/21/20 1138  NA 136  K 4.9  CL 106  CO2 23  GLUCOSE 313*  BUN 25*  CREATININE 2.07*  CALCIUM 8.8*   GFR: Estimated  Creatinine Clearance: 35.6 mL/min (A) (by C-G formula based on SCr of 2.07 mg/dL (H)). Liver Function Tests: No results for input(s): AST, ALT, ALKPHOS, BILITOT, PROT, ALBUMIN in the last 168 hours. No results for input(s): LIPASE, AMYLASE in the last 168 hours. No results for input(s): AMMONIA in the last 168 hours. Coagulation Profile: No results for input(s): INR, PROTIME in the last 168 hours. Cardiac Enzymes: No results for input(s): CKTOTAL, CKMB, CKMBINDEX, TROPONINI in the last 168 hours. BNP (last 3 results) No results for input(s): PROBNP in the last 8760 hours. HbA1C: No results for input(s): HGBA1C in the last 72 hours. CBG: Recent Labs  Lab 04/21/20 1130  GLUCAP 290*   Lipid Profile: No results for input(s): CHOL, HDL, LDLCALC, TRIG, CHOLHDL, LDLDIRECT in the last 72 hours. Thyroid Function Tests: No results for input(s): TSH, T4TOTAL, FREET4, T3FREE, THYROIDAB in the last 72 hours. Anemia Panel: No results for input(s): VITAMINB12, FOLATE, FERRITIN, TIBC, IRON, RETICCTPCT in the last 72 hours. Urine analysis:    Component Value Date/Time   COLORURINE AMBER (A) 04/21/2020 1315   APPEARANCEUR HAZY (A) 04/21/2020 1315   LABSPEC 1.028 04/21/2020 1315   PHURINE 5.0 04/21/2020 1315   GLUCOSEU 50 (A) 04/21/2020 1315   HGBUR NEGATIVE 04/21/2020 1315   BILIRUBINUR NEGATIVE 04/21/2020 1315   KETONESUR NEGATIVE 04/21/2020 1315   PROTEINUR NEGATIVE 04/21/2020 1315   UROBILINOGEN 1.0 06/23/2019 1628   NITRITE NEGATIVE 04/21/2020 [place] 04/21/2020 1315    Radiological Exams on Admission: No results found.  EKG: Independently reviewed.  Chronic LBBB, no PR or QTC issue.  Assessment/Plan Active Problems:   Syncope, vasovagal   Syncope  (please populate well all problems here in Problem List. (For example, if patient is on BP meds at home and you resume or decide to hold them, it is a problem that needs to be her. Same for CAD, COPD, HLD and so  on)  Vasovagal syncope -Suspect likely from over stringent blood pressure control.  Cut down Coreg from 25mg  to 2.5 mg twice daily starting tonight, and postpone all other blood pressure meds including HCTZ, lisinopril, Imdur to restart tomorrow -Echocardiogram -Cardiology consulted. -TSH  Refractory  HTN -As above.  Chronic diastolic CHF -Signs of hypovolemia, received IV bolus in ED, will let blood pressure recover and hold most of blood pressure meds today as above.  IDDM with hyperglycemia -Continue Lantus, add sliding scale.  CKD stage III -Hypokalemia, creatinine level stable.  DVT prophylaxis: Lovenox Code Status: Full Code Family Communication: Daughter over phone Disposition Plan: Less than 2 midnight hospital stay Consults called: Cardiology Admission status: Tele obs   Lequita Halt MD [facility] Pager [phone]  04/21/2020, 3:24 PM

## 2020-04-21 NOTE — ED Notes (Signed)
Report to robin rn on 5c

## 2020-04-21 NOTE — ED Notes (Signed)
Report given to robin n on5c

## 2020-04-21 NOTE — ED Notes (Signed)
Echo finished

## 2020-04-21 NOTE — ED Notes (Addendum)
Patient denies pain, walked to and from the bathroom with no problem and is now resting comfortably.

## 2020-04-21 NOTE — ED Notes (Signed)
Cardiology at bedside.

## 2020-04-21 NOTE — Progress Notes (Signed)
  Echocardiogram 2D Echocardiogram has been performed.  [name] 04/21/2020, 5:01 PM

## 2020-04-21 NOTE — Progress Notes (Addendum)
Received pt from ED. Pt GCS 15, NIH score 1, pt denies visual disturbance dizziness or chest discomfort. Instructed pt on safety precautions for fall risk, turned bed alarm on for pt safety.

## 2020-04-22 DIAGNOSIS — E1165 Type 2 diabetes mellitus with hyperglycemia: Secondary | ICD-10-CM

## 2020-04-22 DIAGNOSIS — R55 Syncope and collapse: Secondary | ICD-10-CM | POA: Diagnosis not present

## 2020-04-22 LAB — BASIC METABOLIC PANEL
Anion gap: 6 (ref 5–15)
BUN: 23 mg/dL (ref 8–23)
CO2: 25 mmol/L (ref 22–32)
Calcium: 8.9 mg/dL (ref 8.9–10.3)
Chloride: 107 mmol/L (ref 98–111)
Creatinine, Ser: 1.58 mg/dL — ABNORMAL HIGH (ref 0.61–1.24)
GFR, Estimated: 48 mL/min — ABNORMAL LOW (ref >60)
Glucose, Bld: 257 mg/dL — ABNORMAL HIGH (ref 70–99)
Potassium: 4.2 mmol/L (ref 3.5–5.1)
Sodium: 138 mmol/L (ref 135–145)

## 2020-04-22 LAB — GLUCOSE, CAPILLARY: Glucose-Capillary: 192 mg/dL — ABNORMAL HIGH (ref 70–99)

## 2020-04-22 MED ORDER — CARVEDILOL 12.5 MG PO TABS: 12.5000 mg | ORAL_TABLET | Freq: Two times a day (BID) | ORAL | 0 refills | Status: DC

## 2020-04-22 MED ORDER — ACETAMINOPHEN 325 MG PO TABS
650.0000 mg | ORAL_TABLET | Freq: Four times a day (QID) | ORAL | Status: DC | PRN
  Administered 2020-04-22: 650 mg via ORAL
  Filled 2020-04-22: qty 2

## 2020-04-22 MED ORDER — LISINOPRIL 20 MG PO TABS: 20.0000 mg | ORAL_TABLET | Freq: Every day | ORAL | 0 refills | Status: DC

## 2020-04-22 MED ORDER — LISINOPRIL 20 MG PO TABS
20.0000 mg | ORAL_TABLET | Freq: Every day | ORAL | Status: DC
  Administered 2020-04-22: 20 mg via ORAL
  Filled 2020-04-22: qty 1

## 2020-04-22 NOTE — Discharge Summary (Signed)
Physician Discharge Summary  [name] GNF:[id number] DOB: July 13, 1955 DOA: 04/21/2020  PCP: Marrian Salvage, FNP  Admit date: 04/21/2020 Discharge date: 04/22/2020  Admitted From: home Discharge disposition: home   Recommendations for Outpatient Follow-Up:   1. Orthostatic hypotension precuations 2. BMP 1 week   Discharge Diagnosis:   Active Problems:   Syncope, vasovagal   Syncope    Discharge Condition: Improved.  Diet recommendation: Low sodium, heart healthy.  Carbohydrate-modified.    Wound care: None.  Code status: Full.   History of Present Illness:   [name] is a 65 y.o. male with medical history significant of refractory HTN on multiple BP meds, chronic diastolic CHF, CAD, IDDM, CKD stage III, history of PVCs status post ablation, presented with syncope.  Patient started to feel lightheadedness this morning after bowel movement.  Then while was driving himself with daughter on the passenger seat, he started to feel "all brightness in my sight", and then started to feel lightheaded and blurry vision, he was able to pull the car and speak with her daughter.  Right after that while daughter was driving, patient started to feel and "warmness on my chest and neck" denies any palpitations, no no chest pains and no nauseous vomiting.  Told her on the passenger side looked and found the patient appeared to be pale with some lips twitching movement.  Daughter then found patient's conscious level waxing waning, and she estimated total time of LOC less than 1 minute.  To report over the current call EMS.  EMS arrived and found patient awake alert. ED Course: Systolic blood pressure in the low 90s, patient was given 1 L of IV bolus and blood pressure improved to 120s and mentation at baseline.  EKG showed chronic LBBB.   Hospital Course by Problem:   Vasovagal syncope -multiple issues in the past and has seen cards  -cut down  meds -Echocardiogram stable -TSH ok  Refractory HTN -As above.  Chronic diastolic CHF -avoid hypotension  IDDM with hyperglycemia -Continue Lantus     Medical Consultants:      Discharge Exam:   Vitals:   04/22/20 0051 04/22/20 0500  BP: 122/82 137/77  Pulse: 71 73  Resp: 18 18  Temp: 98.4 F (36.9 C) 97.8 F (36.6 C)  SpO2: 99% 98%   Vitals:   04/21/20 1816 04/21/20 2200 04/22/20 0051 04/22/20 0500  BP: (!) 144/84 118/73 122/82 137/77  Pulse: 63 70 71 73  Resp: 16 18 18 18   Temp: 98 F (36.7 C) 97.9 F (36.6 C) 98.4 F (36.9 C) 97.8 F (36.6 C)  TempSrc: Oral Oral Oral Oral  SpO2: 100% 98% 99% 98%  Weight:    81.2 kg  Height:        General exam: Appears calm and comfortable.    The results of significant diagnostics from this hospitalization (including imaging, microbiology, ancillary and laboratory) are listed below for reference.     Procedures and Diagnostic Studies:   ECHOCARDIOGRAM COMPLETE  Result Date: 04/21/2020    ECHOCARDIOGRAM REPORT   Patient Name:   [name] Date of Exam: 04/21/2020 Medical Rec #:  [id number]         Height:       65.0 in Accession #:    [id number]        Weight:       186.0 lb Date of Birth:  09-17-1955          BSA:  1.918 m Patient Age:    65 years          BP:           125/77 mmHg Patient Gender: M                 HR:           64 bpm. Exam Location:  Inpatient Procedure: 2D Echo Indications:    syncope  History:        Patient has prior history of Echocardiogram examinations, most                 recent 12/16/2018. Cardiomyopathy, CAD, Arrythmias:PVC; Risk                 Factors:Hypertension, Dyslipidemia, Diabetes and Sleep Apnea.  Sonographer:    Johny Chess Referring Phys: [id number] [place]  1. Left ventricular ejection fraction, by estimation, is 60 to 65%. The left ventricle has normal function. The left ventricle has no regional wall motion abnormalities. There is mild  concentric left ventricular hypertrophy. Left ventricular diastolic parameters are consistent with Grade I diastolic dysfunction (impaired relaxation). Elevated left ventricular end-diastolic pressure.  2. Right ventricular systolic function is normal. The right ventricular size is normal. Tricuspid regurgitation signal is inadequate for assessing PA pressure.  3. Left atrial size was mildly dilated.  4. The mitral valve is normal in structure. No evidence of mitral valve regurgitation. No evidence of mitral stenosis.  5. The aortic valve is normal in structure. Aortic valve regurgitation is not visualized. No aortic stenosis is present.  6. The inferior vena cava is normal in size with greater than 50% respiratory variability, suggesting right atrial pressure of 3 mmHg. FINDINGS  Left Ventricle: Left ventricular ejection fraction, by estimation, is 60 to 65%. The left ventricle has normal function. The left ventricle has no regional wall motion abnormalities. The left ventricular internal cavity size was normal in size. There is  mild concentric left ventricular hypertrophy. Left ventricular diastolic parameters are consistent with Grade I diastolic dysfunction (impaired relaxation). Elevated left ventricular end-diastolic pressure. Right Ventricle: The right ventricular size is normal. No increase in right ventricular wall thickness. Right ventricular systolic function is normal. Tricuspid regurgitation signal is inadequate for assessing PA pressure. Left Atrium: Left atrial size was mildly dilated. Right Atrium: Right atrial size was normal in size. Pericardium: There is no evidence of pericardial effusion. Mitral Valve: The mitral valve is normal in structure. No evidence of mitral valve regurgitation. No evidence of mitral valve stenosis. Tricuspid Valve: The tricuspid valve is normal in structure. Tricuspid valve regurgitation is not demonstrated. No evidence of tricuspid stenosis. Aortic Valve: The aortic  valve is normal in structure. Aortic valve regurgitation is not visualized. No aortic stenosis is present. Pulmonic Valve: The pulmonic valve was normal in structure. Pulmonic valve regurgitation is not visualized. No evidence of pulmonic stenosis. Aorta: The aortic root is normal in size and structure. Venous: The inferior vena cava was not well visualized. The inferior vena cava is normal in size with greater than 50% respiratory variability, suggesting right atrial pressure of 3 mmHg. IAS/Shunts: No atrial level shunt detected by color flow Doppler.  LEFT VENTRICLE PLAX 2D LVIDd:         5.00 cm     Diastology LVIDs:         3.60 cm     LV e' medial:    5.22 cm/s LV PW:  1.20 cm     LV E/e' medial:  10.1 LV IVS:        1.20 cm     LV e' lateral:   7.72 cm/s LVOT diam:     2.00 cm     LV E/e' lateral: 6.8 LV SV:         41 LV SV Index:   21 LVOT Area:     3.14 cm  LV Volumes (MOD) LV vol d, MOD A2C: 52.2 ml LV vol s, MOD A2C: 30.6 ml LV SV MOD A2C:     21.6 ml RIGHT VENTRICLE RV S prime:     7.83 cm/s LEFT ATRIUM             Index       RIGHT ATRIUM          Index LA diam:        4.06 cm 2.12 cm/m  RA Area:     9.58 cm LA Vol (A2C):   60.9 ml 31.75 ml/m RA Volume:   19.10 ml 9.96 ml/m LA Vol (A4C):   56.0 ml 29.20 ml/m LA Biplane Vol: 60.2 ml 31.39 ml/m  AORTIC VALVE LVOT Vmax:   73.80 cm/s LVOT Vmean:  53.800 cm/s LVOT VTI:    0.131 m  AORTA Ao Root diam: 3.00 cm Ao Asc diam:  3.20 cm MITRAL VALVE MV Area (PHT): 2.73 cm    SHUNTS MV Decel Time: 278 msec    Systemic VTI:  0.13 m MV E velocity: 52.70 cm/s  Systemic Diam: 2.00 cm MV A velocity: 85.30 cm/s MV E/A ratio:  0.62 Mihai Croitoru MD Electronically signed by Sanda Klein MD Signature Date/Time: 04/21/2020/5:16:39 PM    Final      Labs:   Basic Metabolic Panel: Recent Labs  Lab 04/21/20 1138 04/22/20 0030  NA 136 138  K 4.9 4.2  CL 106 107  CO2 23 25  GLUCOSE 313* 257*  BUN 25* 23  CREATININE 2.07* 1.58*  CALCIUM 8.8* 8.9    GFR Estimated Creatinine Clearance: 45.8 mL/min (A) (by C-G formula based on SCr of 1.58 mg/dL (H)). Liver Function Tests: No results for input(s): AST, ALT, ALKPHOS, BILITOT, PROT, ALBUMIN in the last 168 hours. No results for input(s): LIPASE, AMYLASE in the last 168 hours. No results for input(s): AMMONIA in the last 168 hours. Coagulation profile No results for input(s): INR, PROTIME in the last 168 hours.  CBC: Recent Labs  Lab 04/21/20 1138  WBC 7.1  HGB 12.4*  HCT 36.7*  MCV 89.3  PLT 287   Cardiac Enzymes: No results for input(s): CKTOTAL, CKMB, CKMBINDEX, TROPONINI in the last 168 hours. BNP: Invalid input(s): POCBNP CBG: Recent Labs  Lab 04/21/20 1130 04/21/20 1654 04/21/20 2213  GLUCAP 290* 200* 263*   D-Dimer No results for input(s): DDIMER in the last 72 hours. Hgb A1c No results for input(s): HGBA1C in the last 72 hours. Lipid Profile No results for input(s): CHOL, HDL, LDLCALC, TRIG, CHOLHDL, LDLDIRECT in the last 72 hours. Thyroid function studies Recent Labs    04/21/20 1600  TSH 0.372   Anemia work up No results for input(s): VITAMINB12, FOLATE, FERRITIN, TIBC, IRON, RETICCTPCT in the last 72 hours. Microbiology Recent Results (from the past 240 hour(s))  SARS CORONAVIRUS 2 (TAT 6-24 HRS) Nasopharyngeal Nasopharyngeal Swab     Status: None   Collection Time: 04/21/20  3:53 PM   Specimen: Nasopharyngeal Swab  Result Value Ref Range Status   SARS  Coronavirus 2 NEGATIVE NEGATIVE Final    Comment: (NOTE) SARS-CoV-2 target nucleic acids are NOT DETECTED.  The SARS-CoV-2 RNA is generally detectable in upper and lower respiratory specimens during the acute phase of infection. Negative results do not preclude SARS-CoV-2 infection, do not rule out co-infections with other pathogens, and should not be used as the sole basis for treatment or other patient management decisions. Negative results must be combined with clinical observations, patient  history, and epidemiological information. The expected result is Negative.  Fact Sheet for Patients: SugarRoll.be  Fact Sheet for Healthcare Providers: https://www.woods-mathews.com/  This test is not yet approved or cleared by the [place] FDA and  has been authorized for detection and/or diagnosis of SARS-CoV-2 by FDA under an Emergency Use Authorization (EUA). This EUA will remain  in effect (meaning this test can be used) for the duration of the COVID-19 declaration under Se ction 564(b)(1) of the Act, 21 U.S.C. section 360bbb-3(b)(1), unless the authorization is terminated or revoked sooner.  Performed at [facility], [place] [address], [place], [place] [address]      Discharge Instructions:   Discharge Instructions    Diet - low sodium heart healthy   Complete by: As directed    Diet Carb Modified   Complete by: As directed    Discharge instructions   Complete by: As directed    Vasovagal syncope -Classic symptoms. Maintain hydration.  If he begins to feel the prodrome of hot, sweaty --- lay down.  Gatorade.   Increase activity slowly   Complete by: As directed      Allergies as of 04/22/2020      Reactions   Ambien [zolpidem Tartrate] Other (See Comments)   Patient stated this medication made him "go crazy"   Metformin And Related Diarrhea      Medication List    STOP taking these medications   hydrochlorothiazide 25 MG tablet Commonly known as: HYDRODIURIL     TAKE these medications   aspirin 81 MG EC tablet Take 1 tablet (81 mg total) by mouth daily.   atorvastatin 80 MG tablet Commonly known as: LIPITOR Take 1 tablet by mouth once daily   carvedilol 12.5 MG tablet Commonly known as: COREG Take 1 tablet (12.5 mg total) by mouth 2 (two) times daily with a meal. What changed:   medication strength  how much to take   clopidogrel 75 MG tablet Commonly known as: PLAVIX Take 1 tablet (75 mg  total) by mouth daily. Please make overdue appt with Dr. Marlou Porch before anymore refills. Thank you 1st attempt What changed: additional instructions   isosorbide mononitrate 60 MG 24 hr tablet Commonly known as: IMDUR Take 1.5 tablets (90 mg total) by mouth daily.   Lantus SoloStar 100 UNIT/ML Solostar Pen Generic drug: insulin glargine Inject 55 Units into the skin every morning. And pen needles 1/day What changed:   when to take this  additional instructions   lisinopril 20 MG tablet Commonly known as: ZESTRIL Take 1 tablet (20 mg total) by mouth daily. Start taking on: April 23, 2020 What changed:   medication strength  how much to take   multivitamin with minerals Tabs tablet Take 1 tablet by mouth daily.         Time coordinating discharge: 25 min  Signed:  Geradine Girt DO  [facility] 04/22/2020, 10:49 AM

## 2020-04-22 NOTE — Progress Notes (Signed)
Discharge instructions reviewed and given to pt, pt acknowledge understanding.

## 2020-04-23 LAB — GLUCOSE, CAPILLARY: Glucose-Capillary: 176 mg/dL — ABNORMAL HIGH (ref 70–99)

## 2020-05-25 ENCOUNTER — Other Ambulatory Visit: Payer: Self-pay

## 2020-05-25 MED ORDER — LISINOPRIL 20 MG PO TABS: 20.0000 mg | ORAL_TABLET | Freq: Every day | ORAL | 7 refills | Status: DC

## 2020-06-05 ENCOUNTER — Other Ambulatory Visit: Payer: Self-pay | Admitting: Endocrinology

## 2020-06-05 DIAGNOSIS — E1165 Type 2 diabetes mellitus with hyperglycemia: Secondary | ICD-10-CM

## 2020-06-12 ENCOUNTER — Telehealth: Payer: Self-pay

## 2020-06-12 ENCOUNTER — Encounter: Payer: Self-pay | Admitting: Gastroenterology

## 2020-06-12 ENCOUNTER — Ambulatory Visit (INDEPENDENT_AMBULATORY_CARE_PROVIDER_SITE_OTHER): Payer: BC Managed Care – PPO | Admitting: Gastroenterology

## 2020-06-12 VITALS — BP 130/90 | HR 91 | Ht 65.0 in | Wt 182.0 lb

## 2020-06-12 DIAGNOSIS — N182 Chronic kidney disease, stage 2 (mild): Secondary | ICD-10-CM | POA: Diagnosis not present

## 2020-06-12 DIAGNOSIS — Z1211 Encounter for screening for malignant neoplasm of colon: Secondary | ICD-10-CM

## 2020-06-12 DIAGNOSIS — I2510 Atherosclerotic heart disease of native coronary artery without angina pectoris: Secondary | ICD-10-CM

## 2020-06-12 DIAGNOSIS — Z7902 Long term (current) use of antithrombotics/antiplatelets: Secondary | ICD-10-CM

## 2020-06-12 DIAGNOSIS — Z800 Family history of malignant neoplasm of digestive organs: Secondary | ICD-10-CM | POA: Diagnosis not present

## 2020-06-12 MED ORDER — PLENVU 140 G PO SOLR
140.0000 g | ORAL | 0 refills | Status: DC
Start: 2020-06-12 — End: 2020-07-13

## 2020-06-12 NOTE — Patient Instructions (Signed)
If you are age 66 or older, your body mass index should be between 23-30. Your Body mass index is 30.29 kg/m. If this is out of the aforementioned range listed, please consider follow up with your Primary Care Provider.  If you are age 29 or younger, your body mass index should be between 19-25. Your Body mass index is 30.29 kg/m. If this is out of the aformentioned range listed, please consider follow up with your Primary Care Provider.   You have been scheduled for a colonoscopy. Please follow written instructions given to you at your visit today.  Please pick up your prep supplies at the pharmacy within the next 1-3 days. If you use inhalers (even only as needed), please bring them with you on the day of your procedure.  You will be contacted by our office prior to your procedure for directions on holding your Plavix.  If you do not hear from our office 1 week prior to your scheduled procedure, please call [phone] to discuss.   It was a pleasure to see you today!  Thank you for trusting me with your gastrointestinal care!

## 2020-06-12 NOTE — Telephone Encounter (Signed)
[facility] Pre-operative Risk Assessment     Request for surgical clearance:     Endoscopy Procedure  What type of surgery is being performed?     Colonoscopy   When is this surgery scheduled?     07-13-2020  What type of clearance is required ?   Pharmacy  Are there any medications that need to be held prior to surgery and how long? Yes, Plavix 5 days   Practice name and name of physician performing surgery?      [facility]  What is your office phone and fax number?      Phone- [phone]  [phone]  Anesthesia type (None, local, MAC, general) ?       MAC

## 2020-06-12 NOTE — Progress Notes (Signed)
[facility] Consult Note:  History: [name] 06/12/2020  Referring provider: Marrian Salvage, [place]  Reason for consult/chief complaint: Colon Cancer Screening   Subjective  HPI:  This patient was referred for colon cancer screening.  Last colonoscopy with Dr. Sharlett Iles April 2011, diminutive rectal hyperplastic polyp. Patient has had several ED visits for vasovagal syncope within the last year.  He had a brief admission for this in late March when he was found to have hypotension suspected to be from his antihypertensive regimen (reportedly has had previously difficult to control hypertension). Seen by cardiology during that hospital stay, and last office visit with them December 2021 after an ED visit for syncope.  Roque says he generally feels well, and denies chest pain, dyspnea, nausea, vomiting, dysphagia odynophagia or weight loss.  He has occasional constipation, and when that occurs he might see a spot of blood on the paper.  Lastly, his sister passed away in the last few years and he is fairly certain that it was colon cancer.  ROS:  Review of Systems  Constitutional: Negative for appetite change and unexpected weight change.  HENT: Negative for mouth sores and voice change.   Eyes: Negative for pain and redness.  Respiratory: Negative for cough and shortness of breath.   Cardiovascular: Negative for chest pain and palpitations.  Genitourinary: Negative for dysuria and hematuria.  Musculoskeletal: Negative for arthralgias and myalgias.  Skin: Negative for pallor and rash.  Neurological: Negative for weakness and headaches.  Hematological: Negative for adenopathy.     Past Medical History: Past Medical History:  Diagnosis Date  . Asthma   . CAD (coronary artery disease)    a. non obst dz by cath 2004  b. 11/12/15 which showed no obvious large vessel CAD. No PCI or clear reason for NSTEMI ( possibly hypertensive urgency?).    [name] CARDIOMYOPATHY    a. NICM, LVEF 30% 01/2011 echo; 20-25% 07/2012 echo, EF recovered by echo 06/2015.  . Congestive heart failure (CHF) ([place])   . DIABETES MELLITUS, TYPE II   . HYPERCHOLESTEROLEMIA   . HYPERTENSION   . Myocardial infarct ([place]) 10/2015  . Noncompliance   . Prostate cancer [facility])    prostate cancer  . PVCs (premature ventricular contractions)    a. s/p ablation 01-07-2013 by Dr Rayann Heman   From Oct 2017 Cardiac Cath report: "1. Mild nonobstructive CAD as outlined, with mild plaque in the mid-RCA, mid-LAD, and first diagonal branch 2. Vigorous LV systolic function with normal LVEDP   No clear explanation for NSTEMI. Hypertensive emergency is a possibility. There is a tiny branch collateral possibly supplying the distal circumflex distribution from the RCA - it is possible this caused NSTEMI but the vessel is very small and occlusion is not visualized"    Past Surgical History: Past Surgical History:  Procedure Laterality Date  . ABLATION  01-07-2013   RVOT PVC's ablated by Dr Rayann Heman along anteroseptal RVOT  . CARDIAC CATHETERIZATION  2004   nonobst dz  . CARDIAC CATHETERIZATION N/A 11/12/2015   Procedure: Left Heart Cath and Coronary Angiography;  Surgeon: Sherren Mocha, MD;  Location: [facility];  Service: Cardiovascular;  Laterality: N/A;  . CHOLECYSTECTOMY N/A 11/16/2013   Procedure: LAPAROSCOPIC CHOLECYSTECTOMY WITH INTRAOPERATIVE CHOLANGIOGRAM;  Surgeon: [name], MD;  Location: [facility] ORS;  Service: General;  Laterality: N/A;  . LYMPHADENECTOMY Bilateral 02/22/2014   Procedure: PELVIC LYMPH NODE DISSECTION;  Surgeon: [name], MD;  Location: [facility] ORS;  Service: Urology;  Laterality: Bilateral;  . PROSTATE SURGERY    . ROBOT ASSISTED LAPAROSCOPIC RADICAL PROSTATECTOMY N/A 02/22/2014   Procedure: ROBOTIC ASSISTED LAPAROSCOPIC RADICAL PROSTATECTOMY WITH INDOCYANINE GREEN DYE AND OPEN UMBILICAL HERNIA REPAIR;  Surgeon: [name], MD;  Location: [facility]  ORS;  Service: Urology;  Laterality: N/A;  . SUPRAVENTRICULAR TACHYCARDIA ABLATION N/A 09/28/2012   Procedure: Tanna Furry Ablation;  Surgeon: [name], MD;  Location: [facility] CATH LAB;  Service: Cardiovascular;  Laterality: N/A;  . SURGERY SCROTAL / TESTICULAR     at age 74  . V-TACH ABLATION N/A 01/07/2013   Procedure: V-TACH ABLATION;  Surgeon: [name], MD;  Location: [facility] CATH LAB;  Service: Cardiovascular;  Laterality: N/A;     Family History: Family History  Problem Relation Age of Onset  . Hypertension Mother   . Peripheral vascular disease Mother   . Diabetes Mother   . Peripheral vascular disease Other   . Hypertension Other   . Prostate cancer Other   . Prostate cancer Brother   . Lung cancer Brother   . Heart attack Paternal Grandfather   . Heart failure Maternal Grandmother   . Diabetes Son   . Colon cancer Sister   . Prostate cancer Brother   . Prostate cancer Brother   . Prostate cancer Brother   . Pancreatic cancer Paternal Aunt   . Esophageal cancer Neg Hx     Social History: Social History   Socioeconomic History  . Marital status: Single    Spouse name: Not on file  . Number of children: Not on file  . Years of education: Not on file  . Highest education level: Not on file  Occupational History  . Occupation: Pension scheme manager: [employer]  Tobacco Use  . Smoking status: Never Smoker  . Smokeless tobacco: Never Used  . Tobacco comment: works for [employer] - mental handic[employer]  Vaping Use  . Vaping Use: Never used  Substance and Sexual Activity  . Alcohol use: Yes    Comment: rare  . Drug use: No  . Sexual activity: Never  Other Topics Concern  . Not on file  Social History Narrative   Single - divorced x 3 -   Lives with 2 sons, enjoys spending time with 6 g-kids and 3 kids   Social Determinants of Health   Financial Resource Strain: Not on file  Food Insecurity: Not on file  Transportation Needs: Not on  file  Physical Activity: Not on file  Stress: Not on file  Social Connections: Not on file    Allergies: Allergies  Allergen Reactions  . Ambien [Zolpidem Tartrate] Other (See Comments)    Patient stated this medication made him "go crazy"  . Metformin And Related Diarrhea    Outpatient Meds: Current Outpatient Medications  Medication Sig Dispense Refill  . aspirin EC 81 MG EC tablet Take 1 tablet (81 mg total) by mouth daily.    [name] atorvastatin (LIPITOR) 80 MG tablet Take 1 tablet by mouth once daily (Patient taking differently: Take 80 mg by mouth daily.) 90 tablet 0  . carvedilol (COREG) 12.5 MG tablet Take 1 tablet (12.5 mg total) by mouth 2 (two) times daily with a meal. 60 tablet 0  . clopidogrel (PLAVIX) 75 MG tablet Take 1 tablet (75 mg total) by mouth daily. Please make overdue appt with Dr. Marlou Porch before anymore refills. Thank you 1st attempt (Patient taking differently: Take 75 mg by mouth daily.) 90 tablet 3  .  isosorbide mononitrate (IMDUR) 60 MG 24 hr tablet Take 1.5 tablets (90 mg total) by mouth daily. 135 tablet 3  . LANTUS SOLOSTAR 100 UNIT/ML Solostar Pen INJECT 55 UNITS INTO THE SKIN EVERY MORNING 15 mL 0  . lisinopril (ZESTRIL) 20 MG tablet Take 1 tablet (20 mg total) by mouth daily. 30 tablet 7  . Multiple Vitamin (MULTIVITAMIN WITH MINERALS) TABS tablet Take 1 tablet by mouth daily.    [name] PEG-KCl-NaCl-NaSulf-Na Asc-C (PLENVU) 140 g SOLR Take 140 g by mouth as directed. 1 each 0   No current facility-administered medications for this visit.      ___________________________________________________________________ Objective   Exam:  BP 130/90   Pulse 91   Ht 5\' 5"  (1.651 m)   Wt 182 lb (82.6 kg)   SpO2 95%   BMI 30.29 kg/m  Wt Readings from Last 3 Encounters:  06/12/20 182 lb (82.6 kg)  04/22/20 179 lb 1.6 oz (81.2 kg)  03/12/20 186 lb (84.4 kg)     General: Pleasant and conversational, ambulatory and well-appearing  Eyes: sclera anicteric, no  redness  ENT: oral mucosa moist without lesions, no cervical or supraclavicular lymphadenopathy  CV: RRR without murmur, S1/S2, no JVD, no peripheral edema  Resp: clear to auscultation bilaterally, normal RR and effort noted  GI: soft, no tenderness, with active bowel sounds. No guarding or palpable organomegaly noted.  Skin; warm and dry, no rash or jaundice noted  Neuro: awake, alert and oriented x 3. Normal gross motor function and fluent speech  Labs:  CBC Latest Ref Rng & Units 04/21/2020 02/06/2020 12/31/2019  WBC 4.0 - 10.5 K/uL 7.1 9.9 10.5  Hemoglobin 13.0 - 17.0 g/dL 12.4(L) 12.5(L) 12.7(L)  Hematocrit 39.0 - 52.0 % 36.7(L) 37.9(L) 37.7(L)  Platelets 150 - 400 K/uL 287 250 313   CMP Latest Ref Rng & Units 04/22/2020 04/21/2020 03/12/2020  Glucose 70 - 99 mg/dL 257(H) 313(H) 192(H)  BUN 8 - 23 mg/dL 23 25(H) 19  Creatinine 0.61 - 1.24 mg/dL 1.58(H) 2.07(H) 1.40  Sodium 135 - 145 mmol/L 138 136 139  Potassium 3.5 - 5.1 mmol/L 4.2 4.9 3.9  Chloride 98 - 111 mmol/L 107 106 104  CO2 22 - 32 mmol/L 25 23 28   Calcium 8.9 - 10.3 mg/dL 8.9 8.8(L) 9.5  Total Protein 6.0 - 8.3 g/dL - - 7.7  Total Bilirubin 0.2 - 1.2 mg/dL - - 0.4  Alkaline Phos 39 - 117 U/L - - 81  AST 0 - 37 U/L - - 19  ALT 0 - 53 U/L - - 20   Most recent hemoglobin A1c 10.7 on February 14  Radiologic Studies:  Echocardiogram 04/21/2020;  1. Left ventricular ejection fraction, by estimation, is 60 to 65%. The  left ventricle has normal function. The left ventricle has no regional  wall motion abnormalities. There is mild concentric left ventricular  hypertrophy. Left ventricular diastolic  parameters are consistent with Grade I diastolic dysfunction (impaired  relaxation). Elevated left ventricular end-diastolic pressure.   2. Right ventricular systolic function is normal. The right ventricular  size is normal. Tricuspid regurgitation signal is inadequate for assessing  PA pressure.   3. Left atrial size  was mildly dilated.   4. The mitral valve is normal in structure. No evidence of mitral valve  regurgitation. No evidence of mitral stenosis.   5. The aortic valve is normal in structure. Aortic valve regurgitation is  not visualized. No aortic stenosis is present.   6. The inferior vena cava  is normal in size with greater than 50%  respiratory variability, suggesting right atrial pressure of 3 mmHg.    Assessment: Encounter Diagnoses  Name Primary?  . Family history of colon cancer Yes  . Long term (current) use of antithrombotics/antiplatelets   . CKD (chronic kidney disease) stage 2, GFR 60-89 ml/min   . Coronary artery disease involving native coronary artery of native heart without angina pectoris     Increased risk colon cancer, other medical comorbidities.  I recommended he have a screening colonoscopy, and he was agreeable after discussion of procedure and risks.  The benefits and risks of the planned procedure were described in detail with the patient or (when appropriate) their health care proxy.  Risks were outlined as including, but not limited to, bleeding, infection, perforation, adverse medication reaction leading to cardiac or pulmonary decompensation, pancreatitis (if ERCP).  The limitation of incomplete mucosal visualization was also discussed.  No guarantees or warranties were given.  Patient at increased risk for cardiopulmonary complications of procedure due to medical comorbidities. (Coronary disease, CKD, hypertension, poorly controlled diabetes)  He will need to hold Plavix 5 days before the procedure, and we will check with his cardiologist on this.  Mr. Balthazor understands the small but real risk of cardiovascular event occurring while he is off Plavix.   Thank you for the courtesy of this consult.  Please call me with any questions or concerns.  Nelida Meuse III  CC: Referring provider noted above

## 2020-06-14 NOTE — Telephone Encounter (Signed)
[name] 65 year old male is requesting cardiac evaluation for colonoscopy.  He was last seen in the clinic on 01/16/2020.  He was doing well at that time.  He denied further episodes of syncope.  He had classic symptoms for vasovagal syncope.  He was post COVID and had been very sick, recovered well.  Follow-up was planned for 6 months.  His PMH includes nonischemic cardiomyopathy, EF 50-55%, coronary artery disease, essential hypertension, diabetes with CKD, vasovagal syncope, and PVCs/PACs.  May his Plavix be held prior to his procedure?  Thank you for your help.  Please direct response to CV DIV preop pool.  Jossie Ng. Nykeria Mealing NP-C    06/14/2020, 9:57 AM [address] Office [phone] Fax [phone]

## 2020-06-15 ENCOUNTER — Encounter: Payer: BC Managed Care – PPO | Admitting: Gastroenterology

## 2020-06-15 NOTE — Telephone Encounter (Signed)
Contacted patient as part of preoperative protocol.  He reports that over the last 2 to 3 months he has noticed an increase in his shortness of breath.  He reports increased dyspnea with routine daily activities such as walking to his bathroom.  He will need to be seen and evaluated prior to clearance for colonoscopy.  Primary Cardiologist:Mark Marlou Porch, MD  Chart reviewed as part of pre-operative protocol coverage. Because of Shaheim Mahar Tinkey's past medical history and time since last visit, he/she will require a follow-up visit in order to better assess preoperative cardiovascular risk.  Pre-op covering staff: - Please schedule appointment and call patient to inform them. - Please contact requesting surgeon's office via preferred method (i.e, phone, fax) to inform them of need for appointment prior to surgery.  If applicable, this message will also be routed to pharmacy pool and/or primary cardiologist for input on holding anticoagulant/antiplatelet agent as requested below so that this information is available at time of patient's appointment.   Deberah Pelton, NP  06/15/2020, 8:49 AM

## 2020-06-15 NOTE — Telephone Encounter (Signed)
Vivien Rota,  Patient will have a Cardiology office visit on 06/26/20 for pre-colonoscopy evaluation due to reported increase in exertional dyspnea.  Please check up on the cardiologist's note to see if further testing planned that may warrant delay in colonoscopy (currently scheduled for 07/13/20)  ____________________  Elta Guadeloupe,    Please send me your note on this patient when you see him in clinic.  Thanks very much.  Herma Ard [facility]

## 2020-06-15 NOTE — Telephone Encounter (Signed)
See clearance notes from pre op provider pt c/o sob. Pt agreeable to pre op appt as well to d/w MD sob symptoms. Pt has been scheduled to see Dr. Marlou Porch 06/26/20 @ 11:40. Pt is grateful for the call and the help. I will forward clearance notes to MD for upcoming appt. Will send FYI to requesting office, pt has appt .

## 2020-06-15 NOTE — Telephone Encounter (Signed)
He may hold his Plavix and ASA 7 days prior to endoscopy.  After procedure, OK to resume Plavix only (drop ASA). Plavix was used previously in the setting of NSTEMI with non obstructive CAD.   Candee Furbish, MD

## 2020-06-20 ENCOUNTER — Other Ambulatory Visit: Payer: Self-pay

## 2020-06-20 MED ORDER — CARVEDILOL 12.5 MG PO TABS: 12.5000 mg | ORAL_TABLET | Freq: Two times a day (BID) | ORAL | 6 refills | Status: DC

## 2020-06-20 NOTE — Telephone Encounter (Signed)
Pt's medication was sent to pt's pharmacy as requested. Confirmation received.  °

## 2020-06-26 ENCOUNTER — Other Ambulatory Visit: Payer: Self-pay

## 2020-06-26 ENCOUNTER — Encounter: Payer: Self-pay | Admitting: *Deleted

## 2020-06-26 ENCOUNTER — Ambulatory Visit (INDEPENDENT_AMBULATORY_CARE_PROVIDER_SITE_OTHER): Payer: BC Managed Care – PPO | Admitting: Cardiology

## 2020-06-26 ENCOUNTER — Encounter: Payer: Self-pay | Admitting: Cardiology

## 2020-06-26 VITALS — BP 130/80 | HR 84 | Ht 65.0 in | Wt 185.0 lb

## 2020-06-26 DIAGNOSIS — Z01812 Encounter for preprocedural laboratory examination: Secondary | ICD-10-CM | POA: Diagnosis not present

## 2020-06-26 DIAGNOSIS — R0602 Shortness of breath: Secondary | ICD-10-CM | POA: Diagnosis not present

## 2020-06-26 DIAGNOSIS — I2510 Atherosclerotic heart disease of native coronary artery without angina pectoris: Secondary | ICD-10-CM | POA: Diagnosis not present

## 2020-06-26 NOTE — Progress Notes (Signed)
Cardiology Office Note:    Date:  06/26/2020   ID:  [name], DOB [birth year], MRN [id number]  PCP:  Marrian Salvage, [place] [facility] Cardiologist:  Candee Furbish, MD     Referring MD: Marrian Salvage,*    History of Present Illness:    [name] is a 65 y.o. male here for pre operative clearance. On the 6/17 he is undergoing a colonoscopy procedure.   Non-obstructive coronary artery disease about 5 years ago.   On Plavix, tolerating well.  He recently complained of having shortness of breath and denies being able to walk a block. Denies shortness of breath worsening. Notes feeling more tired than usual, specifically after showering, driving 4 blocks, and walking up daughters 12 flight of stairs. He denies any exertional chest pain, tightness, or pressure. He has no orthopnea, PND, LE edema, or lightheadedness. Not a former smoker.    Past Medical History:  Diagnosis Date  . Asthma   . CAD (coronary artery disease)    a. non obst dz by cath 2004  b. 11/12/15 which showed no obvious large vessel CAD. No PCI or clear reason for NSTEMI ( possibly hypertensive urgency?).   [name] CARDIOMYOPATHY    a. NICM, LVEF 30% 01/2011 echo; 20-25% 07/2012 echo, EF recovered by echo 06/2015.  . Congestive heart failure (CHF) ([place])   . DIABETES MELLITUS, TYPE II   . HYPERCHOLESTEROLEMIA   . HYPERTENSION   . Myocardial infarct ([place]) 10/2015  . Noncompliance   . Prostate cancer [facility])    prostate cancer  . PVCs (premature ventricular contractions)    a. s/p ablation 01-07-2013 by Dr Rayann Heman    Past Surgical History:  Procedure Laterality Date  . ABLATION  01-07-2013   RVOT PVC's ablated by Dr Rayann Heman along anteroseptal RVOT  . CARDIAC CATHETERIZATION  2004   nonobst dz  . CARDIAC CATHETERIZATION N/A 11/12/2015   Procedure: Left Heart Cath and Coronary Angiography;  Surgeon: Sherren Mocha, MD;  Location: [facility];  Service: Cardiovascular;   Laterality: N/A;  . CHOLECYSTECTOMY N/A 11/16/2013   Procedure: LAPAROSCOPIC CHOLECYSTECTOMY WITH INTRAOPERATIVE CHOLANGIOGRAM;  Surgeon: [name], MD;  Location: [facility];  Service: General;  Laterality: N/A;  . LYMPHADENECTOMY Bilateral 02/22/2014   Procedure: PELVIC LYMPH NODE DISSECTION;  Surgeon: [name], MD;  Location: [facility];  Service: Urology;  Laterality: Bilateral;  . PROSTATE SURGERY    . ROBOT ASSISTED LAPAROSCOPIC RADICAL PROSTATECTOMY N/A 02/22/2014   Procedure: ROBOTIC ASSISTED LAPAROSCOPIC RADICAL PROSTATECTOMY WITH INDOCYANINE GREEN DYE AND OPEN UMBILICAL HERNIA REPAIR;  Surgeon: [name], MD;  Location: [facility];  Service: Urology;  Laterality: N/A;  . SUPRAVENTRICULAR TACHYCARDIA ABLATION N/A 09/28/2012   Procedure: Tanna Furry Ablation;  Surgeon: [name], MD;  Location: [facility] CATH LAB;  Service: Cardiovascular;  Laterality: N/A;  . SURGERY SCROTAL / TESTICULAR     at age 58  . V-TACH ABLATION N/A 01/07/2013   Procedure: V-TACH ABLATION;  Surgeon: Coralyn Jordis Repetto, MD;  Location: [facility] CATH LAB;  Service: Cardiovascular;  Laterality: N/A;    Current Medications: Current Meds  Medication Sig  . aspirin EC 81 MG EC tablet Take 1 tablet (81 mg total) by mouth daily.  [name] atorvastatin (LIPITOR) 80 MG tablet Take 1 tablet by mouth once daily  . carvedilol (COREG) 12.5 MG tablet Take 1 tablet (12.5 mg total) by mouth 2 (two) times daily with a meal.  . clopidogrel (PLAVIX) 75 MG tablet Take 1  tablet (75 mg total) by mouth daily. Please make overdue appt with Dr. Marlou Porch before anymore refills. Thank you 1st attempt  . hydrochlorothiazide (HYDRODIURIL) 25 MG tablet Take 1 tablet by mouth daily.  . isosorbide mononitrate (IMDUR) 60 MG 24 hr tablet Take 1.5 tablets (90 mg total) by mouth daily.  [name] LANTUS SOLOSTAR 100 UNIT/ML Solostar Pen INJECT 55 UNITS INTO THE SKIN EVERY MORNING  . lisinopril (ZESTRIL) 20 MG tablet Take 1 tablet (20 mg total) by mouth daily.  . Multiple  Vitamin (MULTIVITAMIN WITH MINERALS) TABS tablet Take 1 tablet by mouth daily.  [name] PEG-KCl-NaCl-NaSulf-Na Asc-C (PLENVU) 140 g SOLR Take 140 g by mouth as directed.     Allergies:   Ambien [zolpidem tartrate] and Metformin and related   Social History   Socioeconomic History  . Marital status: Single    Spouse name: Not on file  . Number of children: Not on file  . Years of education: Not on file  . Highest education level: Not on file  Occupational History  . Occupation: Pension scheme manager: [employer]  Tobacco Use  . Smoking status: Never Smoker  . Smokeless tobacco: Never Used  . 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 Vaping Use: Never used  Substance and Sexual Activity  . Alcohol use: Yes    Comment: rare  . Drug use: No  . Sexual activity: Never  Other Topics Concern  . Not on file  Social History Narrative   Single - divorced x 3 -   Lives with 2 sons, enjoys spending time with 6 g-kids and 3 kids   Social Determinants of Health   Financial Resource Strain: Not on file  Food Insecurity: Not on file  Transportation Needs: Not on file  Physical Activity: Not on file  Stress: Not on file  Social Connections: Not on file     Family History: The patient's family history includes Colon cancer in his sister; Diabetes in his mother and son; Heart attack in his paternal grandfather; Heart failure in his maternal grandmother; Hypertension in his mother and another family member; Lung cancer in his brother; Pancreatic cancer in his paternal aunt; Peripheral vascular disease in his mother and another family member; Prostate cancer in his brother, brother, brother, brother and another family member. There is no history of Esophageal cancer.  ROS:   Please see the history of present illness. All other systems reviewed and are negative.  EKGs/Labs/Other Studies Reviewed:    The following studies were reviewed  today: Echo 04/21/20- IMPRESSIONS    1. Left ventricular ejection fraction, by estimation, is 60 to 65%. The  left ventricle has normal function. The left ventricle has no regional  wall motion abnormalities. There is mild concentric left ventricular  hypertrophy. Left ventricular diastolic  parameters are consistent with Grade I diastolic dysfunction (impaired  relaxation). Elevated left ventricular end-diastolic pressure.  2. Right ventricular systolic function is normal. The right ventricular  size is normal. Tricuspid regurgitation signal is inadequate for assessing  PA pressure.  3. Left atrial size was mildly dilated.  4. The mitral valve is normal in structure. No evidence of mitral valve  regurgitation. No evidence of mitral stenosis.  5. The aortic valve is normal in structure. Aortic valve regurgitation is  not visualized. No aortic stenosis is present.  6. The inferior vena cava is normal in size with greater than 50%  respiratory variability, suggesting  right atrial pressure of 3 mmHg.  DG Chest 2 View 06/21/19:  IMPRESSION: Slight scarring left base. Lungs otherwise clear. Cardiac silhouette within normal limits.  Echo 12/16/18: 1. Left ventricular ejection fraction, by visual estimation, is 50 to  55%. The left ventricle has normal function. There is severely increased  left ventricular hypertrophy.  2. Left ventricular diastolic parameters are consistent with Grade I  diastolic dysfunction (impaired relaxation).  3. Global right ventricle has normal systolic function.The right  ventricular size is normal.  4. Left atrial size was normal.  5. Right atrial size was normal.  6. The mitral valve is normal in structure. Trace mitral valve  regurgitation. No evidence of mitral stenosis.  7. The tricuspid valve is normal in structure. Tricuspid valve  regurgitation is trivial.  8. The aortic valve is tricuspid. Aortic valve regurgitation is not   visualized. No evidence of aortic valve sclerosis or stenosis.  9. The pulmonic valve was normal in structure. Pulmonic valve  regurgitation is not visualized.  10. Low normal to mildly reduced LV systolic function (EF 50); severe LVH;  grade 1 diastolic dysfunction; trace MR and TR.   Long term Monitor(3-7 days) 12/01/18:  Sinus rhythm with average heart rate of 75 BPM  Occasional premature ventricular contractions and premature atrial contractions (PVC, PAC)  No atrial fibrillation. No adverse arrhtyhmia.  Palpitations are likely a result of PVC, PAC, benign.  Cardiac Catheterization 11/12/15   Mid RCA lesion, 30 %stenosed.  Mid LAD lesion, 25 %stenosed.  1st Diag lesion, 30 %stenosed.  There is hyperdynamic left ventricular systolic function.  The left ventricular ejection fraction is greater than 65% by visual estimate.   1. Mild nonobstructive CAD as outlined, with mild plaque in the mid-RCA, mid-LAD, and first diagonal branch 2. Vigorous LV systolic function with normal LVEDP  No clear explanation for NSTEMI. Hypertensive emergency is a possibility. There is a tiny branch collateral possibly supplying the distal circumflex distribution from the RCA - it is possible this caused NSTEMI but the vessel is very small and occlusion is not visualized.    EKG:   06/26/20-sinus rhythm, rate 84, LAFB  Recent Labs: 03/12/2020: ALT 20 04/21/2020: Hemoglobin 12.4; Platelets 287; TSH 0.372 04/22/2020: BUN 23; Creatinine, Ser 1.58; Potassium 4.2; Sodium 138  Recent Lipid Panel    Component Value Date/Time   CHOL 154 03/12/2020 1043   CHOL 86 (L) 10/11/2018 0955   TRIG 166.0 (H) 03/12/2020 1043   TRIG 200 09/20/2007 0000   HDL 36.40 (L) 03/12/2020 1043   HDL 30 (L) 10/11/2018 0955   CHOLHDL 4 03/12/2020 1043   VLDL 33.2 03/12/2020 1043   LDLCALC 84 03/12/2020 1043   LDLCALC 39 10/11/2018 0955   LDLDIRECT 148.8 04/25/2009 0000     Risk Assessment/Calculations:       Physical Exam:    VS:  BP 130/80 (BP Location: Left Arm, Patient Position: Sitting, Cuff Size: Normal)   Pulse 84   Ht 5\' 5"  (1.651 m)   Wt 185 lb (83.9 kg)   SpO2 96%   BMI 30.79 kg/m     Wt Readings from Last 3 Encounters:  06/26/20 185 lb (83.9 kg)  06/12/20 182 lb (82.6 kg)  04/22/20 179 lb 1.6 oz (81.2 kg)     GEN: Well nourished, well developed in no acute distress HEENT: Normal NECK: No JVD; No carotid bruits LYMPHATICS: No lymphadenopathy CARDIAC: RRR, no murmurs, rubs, gallops RESPIRATORY:  Clear to auscultation without rales, wheezing or rhonchi  ABDOMEN: Soft, non-tender, non-distended MUSCULOSKELETAL:  No edema; No deformity  SKIN: Warm and dry NEUROLOGIC:  Alert and oriented x 3 PSYCHIATRIC:  Normal affect   ASSESSMENT:    1. Pre-procedure lab exam   2. Shortness of breath   3. CAD in native artery    PLAN:    In order of problems listed above:  Preoperative risk assessment - Has an upcoming GI procedure with Dr. Loletha Carrow.  Has been reporting some exertional dyspnea.  Since he had nonobstructive coronary artery disease on prior cardiac catheterization approximately 5 years ago, I will go ahead and check a pharmacologic stress test for further restratification.  His echo performed on 04/21/2020 shows normal function.  Excellent.  Vasovagal syncope -Classic symptoms.  He has had this before.  Maintain hydration.  If he begins to feel the prodrome of hot, sweaty he knows to lay down.  Gatorade. -Prior monitor as follows: Sinus rhythm with average heart rate of 75 BPM  Occasional premature ventricular contractions and premature atrial contractions (PVC, PAC)  No atrial fibrillation. No adverse arrhtyhmia.  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  Had a near death experience he states.    Prior cardiomyopathy -EF returned to normal.    65% EF  Non obstructive coronary artery disease -Continue with aggressive risk factor modification.  Essential  hypertension -LVH noted on echocardiogram.  Good overall blood pressure control.  Continue with current medical management  Diabetes with CKD -Encouraged visits with Dr. Loanne Drilling. -Creatinine ranging from 2.1-2.4   Shared Decision Making/Informed Consent The risks [chest pain, shortness of breath, cardiac arrhythmias, dizziness, blood pressure fluctuations, myocardial infarction, stroke/transient ischemic attack, nausea, vomiting, allergic reaction, radiation exposure, metallic taste sensation and life-threatening complications (estimated to be 1 in 10,000)], benefits (risk stratification, diagnosing coronary artery disease, treatment guidance) and alternatives of a nuclear stress test were discussed in detail with Mr. Chatmon and he agrees to proceed.       Medication Adjustments/Labs and Tests Ordered: Current medicines are reviewed at length with the patient today.  Concerns regarding medicines are outlined above.  Orders Placed This Encounter  Procedures  . Cardiac Stress Test: Informed Consent Details: Physician/Practitioner Attestation; Transcribe to consent form and obtain patient signature  . MYOCARDIAL PERFUSION IMAGING  . EKG 12-Lead   No orders of the defined types were placed in this encounter.   Patient Instructions  Medication Instructions:  The current medical regimen is effective;  continue present plan and medications.  *If you need a refill on your cardiac medications before your next appointment, please call your pharmacy*  Testing/Procedures: Your physician has requested that you have a lexiscan myoview. For further information please visit [email or web address]. Please follow instruction sheet, as given.  Follow-Up: At [facility], you and your health needs are our priority.  As part of our continuing mission to provide you with exceptional heart care, we have created designated Provider Care Teams.  These Care Teams include your primary Cardiologist  (physician) and Advanced Practice Providers (APPs -  Physician Assistants and Nurse Practitioners) who all work together to provide you with the care you need, when you need it.  We recommend signing up for the patient portal called "MyChart".  Sign up information is provided on this After Visit Summary.  MyChart is used to connect with patients for Virtual Visits (Telemedicine).  Patients are able to view lab/test results, encounter notes, upcoming appointments, etc.  Non-urgent messages can be sent to your provider as well.   To  learn more about what you can do with MyChart, go to [email or web address].    Your next appointment:   1 year(s)  The format for your next appointment:   In Person  Provider:   Candee Furbish, MD   Thank you for choosing [facility]!!         I,Alexis Bryant,acting as a scribe for Candee Furbish, MD.,have documented all relevant documentation on the behalf of Candee Furbish, MD,as directed by  Candee Furbish, MD while in the presence of Candee Furbish, MD.  I, Candee Furbish, MD, have reviewed all documentation for this visit. The documentation on 06/26/20 for the exam, diagnosis, procedures, and orders are all accurate and complete.   Signed, Candee Furbish, MD  06/26/2020 1:33 PM    [facility]

## 2020-06-26 NOTE — Patient Instructions (Signed)
Medication Instructions: The current medical regimen is effective;  continue present plan and medications.  *If you need a refill on your cardiac medications before your next appointment, please call your pharmacy*  Testing/Procedures: Your physician has requested that you have a lexiscan myoview. For further information please visit [email or web address]. Please follow instruction sheet, as given.  Follow-Up: At [facility], you and your health needs are our priority.  As part of our continuing mission to provide you with exceptional heart care, we have created designated Provider Care Teams.  These Care Teams include your primary Cardiologist (physician) and Advanced Practice Providers (APPs -  Physician Assistants and Nurse Practitioners) who all work together to provide you with the care you need, when you need it.  We recommend signing up for the patient portal called "MyChart".  Sign up information is provided on this After Visit Summary.  MyChart is used to connect with patients for Virtual Visits (Telemedicine).  Patients are able to view lab/test results, encounter notes, upcoming appointments, etc.  Non-urgent messages can be sent to your provider as well.   To learn more about what you can do with MyChart, go to [email or web address].    Your next appointment:   1 year(s)  The format for your next appointment:   In Person  Provider:   Mark Skains, MD   Thank you for choosing  [facility]!!    

## 2020-06-27 NOTE — Telephone Encounter (Signed)
There is not likely to be a report from that stress test and then recommendation from his cardiologist on preprocedure Plavix hold in time for colonoscopy on June 17 as scheduled  Therefore, please contact the patient and move his colonoscopy to sometime in mid July (it is routine for family history colon cancer).  - HD

## 2020-06-27 NOTE — Telephone Encounter (Signed)
Pt contacted. He declines to move his procedure a this time. He wants to keep everything as scheduled

## 2020-06-27 NOTE — Telephone Encounter (Signed)
Update. Patient has seen cardiology on 06-26-2020. He has been scheduled for a myocardial perfusion imaging test on 07-04-2020.

## 2020-07-03 ENCOUNTER — Telehealth (HOSPITAL_COMMUNITY): Payer: Self-pay | Admitting: *Deleted

## 2020-07-03 NOTE — Telephone Encounter (Signed)
Close encounter 

## 2020-07-04 ENCOUNTER — Ambulatory Visit (HOSPITAL_COMMUNITY)
Admission: RE | Admit: 2020-07-04 | Discharge: 2020-07-04 | Disposition: A | Discharge status: Home / Self Care | Payer: BC Managed Care – PPO | Source: Ambulatory Visit | Attending: Cardiovascular Disease | Admitting: Cardiovascular Disease

## 2020-07-04 ENCOUNTER — Other Ambulatory Visit: Payer: Self-pay

## 2020-07-04 DIAGNOSIS — R0602 Shortness of breath: Secondary | ICD-10-CM | POA: Diagnosis not present

## 2020-07-04 DIAGNOSIS — I2510 Atherosclerotic heart disease of native coronary artery without angina pectoris: Secondary | ICD-10-CM

## 2020-07-04 DIAGNOSIS — Z01812 Encounter for preprocedural laboratory examination: Secondary | ICD-10-CM

## 2020-07-04 DIAGNOSIS — Z01810 Encounter for preprocedural cardiovascular examination: Secondary | ICD-10-CM | POA: Diagnosis not present

## 2020-07-04 LAB — MYOCARDIAL PERFUSION IMAGING
LV dias vol: 127 mL (ref 62–150)
LV sys vol: 74 mL
Peak HR: 93 {beats}/min
Rest HR: 68 {beats}/min
SDS: 1
SRS: 3
SSS: 4
TID: 1.07

## 2020-07-04 MED ORDER — TECHNETIUM TC 99M TETROFOSMIN IV KIT
32.4000 | PACK | Freq: Once | INTRAVENOUS | Status: AC | PRN
  Administered 2020-07-04: 32.4 via INTRAVENOUS
  Filled 2020-07-04: qty 33

## 2020-07-04 MED ORDER — REGADENOSON 0.4 MG/5ML IV SOLN
0.4000 mg | Freq: Once | INTRAVENOUS | Status: AC
  Administered 2020-07-04: 0.4 mg via INTRAVENOUS

## 2020-07-04 MED ORDER — TECHNETIUM TC 99M TETROFOSMIN IV KIT
9.8000 | PACK | Freq: Once | INTRAVENOUS | Status: AC | PRN
  Administered 2020-07-04: 9.8 via INTRAVENOUS
  Filled 2020-07-04: qty 10

## 2020-07-05 ENCOUNTER — Other Ambulatory Visit: Payer: Self-pay | Admitting: Endocrinology

## 2020-07-05 DIAGNOSIS — E1165 Type 2 diabetes mellitus with hyperglycemia: Secondary | ICD-10-CM

## 2020-07-05 NOTE — Telephone Encounter (Signed)
Patient had his cardiac testing yesterday.

## 2020-07-05 NOTE — Telephone Encounter (Signed)
Mark,   Thanks for the prompt reply with the stress test result. You OK with a 5 day plavix hold before colonoscopy?  - Wilfrid Lund

## 2020-07-06 NOTE — Telephone Encounter (Signed)
Patient called to check on Lantus refill - did schedule him for an appointment 09/28/2020

## 2020-07-06 NOTE — Telephone Encounter (Signed)
   Primary Cardiologist: Candee Furbish, MD  Chart reviewed as part of pre-operative protocol coverage. Given past medical history and time since last visit, based on ACC/AHA guidelines, SILER MAVIS would be at acceptable risk for the planned procedure without further cardiovascular testing.   His Plavix may be held for 5 days prior to his procedure.  Please resume as soon as hemostasis is achieved.  I will route this recommendation to the requesting party via Epic fax function and remove from pre-op pool.  Please call with questions.  Jossie Ng. Eldredge Veldhuizen NP-C    07/06/2020, 4:36 PM [facility] [place] [address] Office [phone] Fax [phone]

## 2020-07-06 NOTE — Telephone Encounter (Signed)
Please see communications below.  Can we now proceed with the clearance to hold Plavix for 5 days.  He is scheduled for 07-13-2020  Thank you

## 2020-07-06 NOTE — Telephone Encounter (Signed)
Please advise on holding Plavix prior to procedure.  Thank you for your help.  Please direct response to CV DIV pool.  Jossie Ng. Gilford Lardizabal NP-C    07/06/2020, 3:33 PM [place] [address] Office [phone] Fax [phone]

## 2020-07-11 ENCOUNTER — Encounter: Payer: Self-pay | Admitting: Gastroenterology

## 2020-07-13 ENCOUNTER — Ambulatory Visit (AMBULATORY_SURGERY_CENTER): Payer: BC Managed Care – PPO | Admitting: Gastroenterology

## 2020-07-13 ENCOUNTER — Other Ambulatory Visit: Payer: Self-pay

## 2020-07-13 ENCOUNTER — Encounter: Payer: Self-pay | Admitting: Gastroenterology

## 2020-07-13 VITALS — BP 101/56 | HR 66 | Temp 97.5°F | Resp 14 | Ht 65.0 in | Wt 182.0 lb

## 2020-07-13 DIAGNOSIS — Z800 Family history of malignant neoplasm of digestive organs: Secondary | ICD-10-CM

## 2020-07-13 DIAGNOSIS — Z1211 Encounter for screening for malignant neoplasm of colon: Secondary | ICD-10-CM | POA: Diagnosis present

## 2020-07-13 MED ORDER — SODIUM CHLORIDE 0.9 % IV SOLN: 500.0000 mL | Freq: Once | INTRAVENOUS | Status: DC

## 2020-07-13 NOTE — Patient Instructions (Signed)
Impression/Recommendations:  Resume previous diet. Continue present medications. Repeat colonoscopy in 5 years for screening purposes.  Resume Plavix (clopidogrel) at prior dose today.  YOU HAD AN ENDOSCOPIC PROCEDURE TODAY AT [facility]:   Refer to the procedure report that was given to you for any specific questions about what was found during the examination.  If the procedure report does not answer your questions, please call your gastroenterologist to clarify.  If you requested that your care partner not be given the details of your procedure findings, then the procedure report has been included in a sealed envelope for you to review at your convenience later.  YOU SHOULD EXPECT: Some feelings of bloating in the abdomen. Passage of more gas than usual.  Walking can help get rid of the air that was put into your GI tract during the procedure and reduce the bloating. If you had a lower endoscopy (such as a colonoscopy or flexible sigmoidoscopy) you may notice spotting of blood in your stool or on the toilet paper. If you underwent a bowel prep for your procedure, you may not have a normal bowel movement for a few days.  Please Note:  You might notice some irritation and congestion in your nose or some drainage.  This is from the oxygen used during your procedure.  There is no need for concern and it should clear up in a day or so.  SYMPTOMS TO REPORT IMMEDIATELY:  Following lower endoscopy (colonoscopy or flexible sigmoidoscopy):  Excessive amounts of blood in the stool  Significant tenderness or worsening of abdominal pains  Swelling of the abdomen that is new, acute  Fever of 100F or higher For urgent or emergent issues, a gastroenterologist can be reached at any hour by calling [phone]. Do not use MyChart messaging for urgent concerns.    DIET:  We do recommend a small meal at first, but then you may proceed to your regular diet.  Drink plenty of fluids but  you should avoid alcoholic beverages for 24 hours.  ACTIVITY:  You should plan to take it easy for the rest of today and you should NOT DRIVE or use heavy machinery until tomorrow (because of the sedation medicines used during the test).    FOLLOW UP: Our staff will call the number listed on your records 48-72 hours following your procedure to check on you and address any questions or concerns that you may have regarding the information given to you following your procedure. If we do not reach you, we will leave a message.  We will attempt to reach you two times.  During this call, we will ask if you have developed any symptoms of COVID 19. If you develop any symptoms (ie: fever, flu-like symptoms, shortness of breath, cough etc.) before then, please call [phone].  If you test positive for Covid 19 in the 2 weeks post procedure, please call and report this information to [place].    If any biopsies were taken you will be contacted by phone or by letter within the next 1-3 weeks.  Please call us at [phone] if you have not heard about the biopsies in 3 weeks.    SIGNATURES/CONFIDENTIALITY: You and/or your care partner have signed paperwork which will be entered into your electronic medical record.  These signatures attest to the fact that that the information above on your After Visit Summary has been reviewed and is understood.  Full responsibility of the confidentiality of this discharge information lies with  you and/or your care-partner.

## 2020-07-13 NOTE — Progress Notes (Signed)
pt tolerated well. VSS. awake and to recovery. Report given to RN.  

## 2020-07-13 NOTE — Progress Notes (Signed)
History reviewed today 

## 2020-07-13 NOTE — Op Note (Signed)
[name] Procedure Date: 07/13/2020 3:27 PM MRN: [id number] Endoscopist: Mallie Mussel L. Loletha Carrow , MD Age: 65 Referring MD:  Date of Birth: 07-18-1955 Gender: Male Account #: [id number] Procedure:                Colonoscopy Indications:              Colon cancer screening in patient at increased                            risk: Colorectal cancer in sister Medicines:                Monitored Anesthesia Care Procedure:                Pre-Anesthesia Assessment:                           - Prior to the procedure, a History and Physical                            was performed, and patient medications and                            allergies were reviewed. The patient's tolerance of                            previous anesthesia was also reviewed. The risks                            and benefits of the procedure and the sedation                            options and risks were discussed with the patient.                            All questions were answered, and informed consent                            was obtained. Prior Anticoagulants: The patient has                            taken Plavix (clopidogrel), last dose was 5 days                            prior to procedure. ASA Grade Assessment: III - A                            patient with severe systemic disease. After                            reviewing the risks and benefits, the patient was                            deemed in satisfactory condition to undergo the  procedure.                           After obtaining informed consent, the colonoscope                            was passed under direct vision. Throughout the                            procedure, the patient's blood pressure, pulse, and                            oxygen saturations were monitored continuously. The                            Olympus CF-HQ190 [id number]) Colonoscope was                             introduced through the anus and advanced to the the                            cecum, identified by appendiceal orifice and                            ileocecal valve. The colonoscopy was performed                            without difficulty. The patient tolerated the                            procedure well. The quality of the bowel                            preparation was good (except for some residual                            fibrous material in the cecum that could not be                            completely cleared). The ileocecal valve,                            appendiceal orifice, and rectum were photographed. Scope In: 3:48:05 PM Scope Out: 3:58:45 PM Scope Withdrawal Time: 0 hours 9 minutes 1 second  Total Procedure Duration: 0 hours 10 minutes 40 seconds  Findings:                 The perianal and digital rectal examinations were                            normal.                           The entire examined colon appeared normal on direct  and retroflexion views. Complications:            No immediate complications. Estimated Blood Loss:     Estimated blood loss: none. Impression:               - The entire examined colon is normal on direct and                            retroflexion views.                           - No specimens collected. Recommendation:           - Patient has a contact number available for                            emergencies. The signs and symptoms of potential                            delayed complications were discussed with the                            patient. Return to normal activities tomorrow.                            Written discharge instructions were provided to the                            patient.                           - Resume previous diet.                           - Continue present medications.                           - Repeat colonoscopy in 5 years for screening                             purposes.                           - Resume Plavix (clopidogrel) at prior dose today. Sigmund Morera L. Loletha Carrow, MD 07/13/2020 4:02:30 PM This report has been signed electronically.

## 2020-07-13 NOTE — Telephone Encounter (Signed)
Patient has already been notified and aware back on the 10th of June

## 2020-07-17 ENCOUNTER — Telehealth: Payer: Self-pay | Admitting: *Deleted

## 2020-07-17 NOTE — Telephone Encounter (Signed)
`  Have you developed a fever since your procedure?no  2.   Have you had an respiratory symptoms (SOB or cough) since your procedure? no  3.   Have you tested positive for COVID 19 since your procedure no  4.   Have you had any family members/close contacts diagnosed with the COVID 19 since your procedure?  no   If yes to any of these questions please route to Joylene John, RN and Joella Prince, RN  Follow up Call-  Call back number 07/13/2020  Post procedure Call Back phone  # [phone]  Permission to leave phone message Yes  Some recent data might be hidden     Patient questions:  Do you have a fever, pain , or abdominal swelling? No. Pain Score  0 *  Have you tolerated food without any problems? Yes.    Have you been able to return to your normal activities? Yes.    Do you have any questions about your discharge instructions: Diet   No. Medications  No. Follow up visit  No.  Do you have questions or concerns about your Care? No.  Actions: * If pain score is 4 or above: No action needed, pain <4.

## 2020-08-03 ENCOUNTER — Telehealth: Payer: Self-pay | Admitting: Endocrinology

## 2020-08-03 NOTE — Telephone Encounter (Signed)
Pt will be out of medication tonight  MEDICATION: lantus  PHARMACY:   [facility], [address] Phone:  [phone]  Fax:  [phone]      HAS THE PATIENT CONTACTED [place]?  yes  IS THIS A 90 DAY SUPPLY : no, 5 pens  IS PATIENT OUT OF MEDICATION: yes  IF NOT; HOW MUCH IS LEFT: none  LAST APPOINTMENT DATE: @6 /09/2020  NEXT APPOINTMENT DATE:@7 /12/2020  DO WE HAVE YOUR PERMISSION TO LEAVE A DETAILED MESSAGE?: yes   **Let patient know to contact pharmacy at the end of the day to make sure medication is ready. **  ** Please notify patient to allow 48-72 hours to process**  **Encourage patient to contact the pharmacy for refills or they can request refills through [facility]**

## 2020-08-07 ENCOUNTER — Ambulatory Visit (INDEPENDENT_AMBULATORY_CARE_PROVIDER_SITE_OTHER): Payer: BC Managed Care – PPO | Admitting: Endocrinology

## 2020-08-07 ENCOUNTER — Encounter: Payer: Self-pay | Admitting: Endocrinology

## 2020-08-07 ENCOUNTER — Other Ambulatory Visit: Payer: Self-pay

## 2020-08-07 ENCOUNTER — Telehealth: Payer: Self-pay | Admitting: Pharmacy Technician

## 2020-08-07 VITALS — BP 142/78 | HR 86 | Ht 65.0 in | Wt 185.4 lb

## 2020-08-07 DIAGNOSIS — E1165 Type 2 diabetes mellitus with hyperglycemia: Secondary | ICD-10-CM | POA: Diagnosis not present

## 2020-08-07 MED ORDER — LANTUS SOLOSTAR 100 UNIT/ML ~~LOC~~ SOPN: 50.0000 [IU] | PEN_INJECTOR | SUBCUTANEOUS | 3 refills | Status: DC

## 2020-08-07 MED ORDER — OZEMPIC (0.25 OR 0.5 MG/DOSE) 2 MG/1.5ML ~~LOC~~ SOPN: 0.5000 mg | PEN_INJECTOR | SUBCUTANEOUS | 3 refills | Status: DC

## 2020-08-07 NOTE — Progress Notes (Signed)
Subjective:    Patient ID: [name], male    DOB: December 07, 1955, 65 y.o.   MRN: [id number]  HPI Pt returns for f/u of diabetes mellitus: DM type: Insulin-requiring type 2 Dx'ed: 4580 Complications: CAD and PN Therapy: insulin since mid-2018.   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   Other: due to noncompliance, he is not a candidate for multiple daily injections.   Interval history: pt states he feels well in general.  He says he never misses the insulin.  He brings his meter with his cbg's which I have reviewed today.   Glucose varies from 97-394.  He checks fasting only.  no recent steroids.  He says he takes 55 units qam.  Past Medical History:  Diagnosis Date   Asthma    CAD (coronary artery disease)    a. non obst dz by cath 2004  b. 11/12/15 which showed no obvious large vessel CAD. No PCI or clear reason for NSTEMI ( possibly hypertensive urgency?).    CARDIOMYOPATHY    a. NICM, LVEF 30% 01/2011 echo; 20-25% 07/2012 echo, EF recovered by echo 06/2015.   Congestive heart failure (CHF) ([place])    DIABETES MELLITUS, TYPE II    HYPERCHOLESTEROLEMIA    HYPERTENSION    Myocardial infarct ([place]) 10/2015   Noncompliance    Prostate cancer ([place])    prostate cancer   PVCs (premature ventricular contractions)    a. s/p ablation 01-07-2013 by Dr Rayann Heman    Past Surgical History:  Procedure Laterality Date   ABLATION  01-07-2013   RVOT PVC's ablated by Dr Rayann Heman along anteroseptal RVOT   CARDIAC CATHETERIZATION  2004   nonobst dz   CARDIAC CATHETERIZATION N/A 11/12/2015   Procedure: Left Heart Cath and Coronary Angiography;  Surgeon: Sherren Mocha, MD;  Location: [facility];  Service: Cardiovascular;  Laterality: N/A;   CHOLECYSTECTOMY N/A 11/16/2013   Procedure: LAPAROSCOPIC CHOLECYSTECTOMY WITH INTRAOPERATIVE CHOLANGIOGRAM;  Surgeon: [name], MD;   Location: [facility];  Service: General;  Laterality: N/A;   LYMPHADENECTOMY Bilateral 02/22/2014   Procedure: PELVIC LYMPH NODE DISSECTION;  Surgeon: [name], MD;  Location: [facility];  Service: Urology;  Laterality: Bilateral;   PROSTATE SURGERY     ROBOT ASSISTED LAPAROSCOPIC RADICAL PROSTATECTOMY N/A 02/22/2014   Procedure: ROBOTIC ASSISTED LAPAROSCOPIC RADICAL PROSTATECTOMY WITH INDOCYANINE GREEN DYE AND OPEN UMBILICAL HERNIA REPAIR;  Surgeon: [name], MD;  Location: [facility];  Service: Urology;  Laterality: N/A;   SUPRAVENTRICULAR TACHYCARDIA ABLATION N/A 09/28/2012   Procedure: Tanna Furry Ablation;  Surgeon: [name], MD;  Location: [facility] LAB;  Service: Cardiovascular;  Laterality: N/A;   SURGERY SCROTAL / TESTICULAR     at age 20   V-TACH ABLATION N/A 01/07/2013   Procedure: V-TACH ABLATION;  Surgeon: [name], MD;  Location: [facility] CATH LAB;  Service: Cardiovascular;  Laterality: N/A;    Social History   Socioeconomic History   Marital status: Single    Spouse name: Not on file   Number of children: Not on file   Years of education: Not on file   Highest education level: Not on file  Occupational History   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    Employer: [employer]  Tobacco Use   Smoking status: Never   Smokeless tobacco: Never   Tobacco comments:    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  [place]  Use   Vaping Use: Never used  Substance and Sexual Activity   Alcohol use: Yes    Comment: rare   Drug use: No   Sexual activity: Never  Other Topics Concern   Not on file  Social History Narrative   Single - divorced x 3 -   Lives with 2 sons, enjoys spending time with 6 g-kids and 3 kids   Social Determinants of Health   Financial Resource Strain: Not on file  Food Insecurity: Not on file  Transportation Needs: Not on file  Physical Activity: Not on file  Stress: Not on file  Social Connections: Not on file  Intimate Partner Violence: Not on  file    Current Outpatient Medications on File Prior to Visit  Medication Sig Dispense Refill   aspirin EC 81 MG EC tablet Take 1 tablet (81 mg total) by mouth daily.     atorvastatin (LIPITOR) 80 MG tablet Take 1 tablet by mouth once daily 90 tablet 0   carvedilol (COREG) 12.5 MG tablet Take 1 tablet (12.5 mg total) by mouth 2 (two) times daily with a meal. 60 tablet 6   clopidogrel (PLAVIX) 75 MG tablet Take 1 tablet (75 mg total) by mouth daily. Please make overdue appt with Dr. Marlou Porch before anymore refills. Thank you 1st attempt 90 tablet 3   hydrochlorothiazide (HYDRODIURIL) 25 MG tablet Take 1 tablet by mouth daily.     isosorbide mononitrate (IMDUR) 60 MG 24 hr tablet Take 1.5 tablets (90 mg total) by mouth daily. 135 tablet 3   lisinopril (ZESTRIL) 20 MG tablet Take 1 tablet (20 mg total) by mouth daily. 30 tablet 7   Multiple Vitamin (MULTIVITAMIN WITH MINERALS) TABS tablet Take 1 tablet by mouth daily.     No current facility-administered medications on file prior to visit.    Allergies  Allergen Reactions   Ambien [Zolpidem Tartrate] Other (See Comments)    Patient stated this medication made him "go crazy"   Metformin And Related Diarrhea    Family History  Problem Relation Age of Onset   Hypertension Mother    Peripheral vascular disease Mother    Diabetes Mother    Colon cancer Sister    Prostate cancer Brother    Lung cancer Brother    Prostate cancer Brother    Prostate cancer Brother    Prostate cancer Brother    Pancreatic cancer Paternal Aunt    Heart failure Maternal Grandmother    Heart attack Paternal Grandfather    Diabetes Son    Peripheral vascular disease Other    Hypertension Other    Prostate cancer Other    Esophageal cancer Neg Hx    Rectal cancer Neg Hx    Stomach cancer Neg Hx     BP (!) 142/78 (BP Location: Left Arm, Patient Position: Sitting, Cuff Size: Normal)   Pulse 86   Ht 5\' 5"  (1.651 m)   Wt 185 lb 6.4 oz (84.1 kg)   SpO2 96%    BMI 30.85 kg/m    Review of Systems He denies hypoglycemia.      Objective:   Physical Exam Pulses: dorsalis pedis intact bilat.   MSK: no deformity of the feet CV: no leg edema Skin:  no ulcer on the feet.  normal color and temp on the feet. Neuro: sensation is intact to touch on the feet, but decreased from normal.   Ext: there is bilateral onychomycosis of the toenails  A1c=10.4%  Assessment & Plan:  Insulin-requiring type 2 DM: uncontrolled.   Patient Instructions  For now, please decrease the insulin to 50 units each morning, and:   I have sent a prescription to your pharmacy, to add "Ozempic."  Take just 0.25 mg the first week or 2.  Then increase to the full 0.5 mg. On this type of insulin schedule, you should eat meals on a regular schedule.  If a meal is missed or significantly delayed, your blood sugar could go low.   check your blood sugar twice a day.  vary the time of day when you check, between before the 3 meals, and at bedtime.  also check if you have symptoms of your blood sugar being too high or too low.  please keep a record of the readings and bring it to your next appointment here (or you can bring the meter itself).  You can write it on any piece of paper.  please call us sooner if your blood sugar goes below 70, or if you have a lot of readings over 200. Please come back for a follow-up appointment in 2 months.

## 2020-08-07 NOTE — Telephone Encounter (Addendum)
Patient Advocate Encounter   Received notification from [facility] that prior authorization for [facility] is required.   PA submitted on 08/07/2020 Key [id number] Status is [facility] will continue to follow.   Venida Jarvis. Nadara Mustard, CPhT Patient Advocate [facility] Phone: [phone] Fax:  [phone]

## 2020-08-07 NOTE — Patient Instructions (Addendum)
For now, please decrease the insulin to 50 units each morning, and:   I have sent a prescription to your pharmacy, to add "Ozempic."  Take just 0.25 mg the first week or 2.  Then increase to the full 0.5 mg. On this type of insulin schedule, you should eat meals on a regular schedule.  If a meal is missed or significantly delayed, your blood sugar could go low.   check your blood sugar twice a day.  vary the time of day when you check, between before the 3 meals, and at bedtime.  also check if you have symptoms of your blood sugar being too high or too low.  please keep a record of the readings and bring it to your next appointment here (or you can bring the meter itself).  You can write it on any piece of paper.  please call us sooner if your blood sugar goes below 70, or if you have a lot of readings over 200. Please come back for a follow-up appointment in 2 months.

## 2020-09-21 ENCOUNTER — Other Ambulatory Visit: Payer: Self-pay

## 2020-09-21 ENCOUNTER — Emergency Department (HOSPITAL_BASED_OUTPATIENT_CLINIC_OR_DEPARTMENT_OTHER): Payer: BC Managed Care – PPO

## 2020-09-21 ENCOUNTER — Emergency Department (HOSPITAL_BASED_OUTPATIENT_CLINIC_OR_DEPARTMENT_OTHER)
Admission: EM | Admit: 2020-09-21 | Discharge: 2020-09-21 | Disposition: A | Discharge status: Home / Self Care | Payer: BC Managed Care – PPO | Attending: Emergency Medicine | Admitting: Emergency Medicine

## 2020-09-21 ENCOUNTER — Encounter (HOSPITAL_BASED_OUTPATIENT_CLINIC_OR_DEPARTMENT_OTHER): Payer: Self-pay

## 2020-09-21 DIAGNOSIS — Z794 Long term (current) use of insulin: Secondary | ICD-10-CM | POA: Diagnosis not present

## 2020-09-21 DIAGNOSIS — I5022 Chronic systolic (congestive) heart failure: Secondary | ICD-10-CM | POA: Diagnosis not present

## 2020-09-21 DIAGNOSIS — I130 Hypertensive heart and chronic kidney disease with heart failure and stage 1 through stage 4 chronic kidney disease, or unspecified chronic kidney disease: Secondary | ICD-10-CM | POA: Insufficient documentation

## 2020-09-21 DIAGNOSIS — E1165 Type 2 diabetes mellitus with hyperglycemia: Secondary | ICD-10-CM | POA: Insufficient documentation

## 2020-09-21 DIAGNOSIS — Z7982 Long term (current) use of aspirin: Secondary | ICD-10-CM | POA: Diagnosis not present

## 2020-09-21 DIAGNOSIS — R0602 Shortness of breath: Secondary | ICD-10-CM | POA: Diagnosis present

## 2020-09-21 DIAGNOSIS — Z20822 Contact with and (suspected) exposure to covid-19: Secondary | ICD-10-CM | POA: Insufficient documentation

## 2020-09-21 DIAGNOSIS — Z8546 Personal history of malignant neoplasm of prostate: Secondary | ICD-10-CM | POA: Diagnosis not present

## 2020-09-21 DIAGNOSIS — J45909 Unspecified asthma, uncomplicated: Secondary | ICD-10-CM | POA: Diagnosis not present

## 2020-09-21 DIAGNOSIS — R0600 Dyspnea, unspecified: Secondary | ICD-10-CM | POA: Diagnosis not present

## 2020-09-21 DIAGNOSIS — I2510 Atherosclerotic heart disease of native coronary artery without angina pectoris: Secondary | ICD-10-CM | POA: Diagnosis not present

## 2020-09-21 DIAGNOSIS — E871 Hypo-osmolality and hyponatremia: Secondary | ICD-10-CM | POA: Insufficient documentation

## 2020-09-21 DIAGNOSIS — Z79899 Other long term (current) drug therapy: Secondary | ICD-10-CM | POA: Insufficient documentation

## 2020-09-21 DIAGNOSIS — N182 Chronic kidney disease, stage 2 (mild): Secondary | ICD-10-CM | POA: Insufficient documentation

## 2020-09-21 DIAGNOSIS — Z8616 Personal history of COVID-19: Secondary | ICD-10-CM | POA: Insufficient documentation

## 2020-09-21 DIAGNOSIS — Z7902 Long term (current) use of antithrombotics/antiplatelets: Secondary | ICD-10-CM | POA: Insufficient documentation

## 2020-09-21 DIAGNOSIS — R739 Hyperglycemia, unspecified: Secondary | ICD-10-CM

## 2020-09-21 DIAGNOSIS — E1122 Type 2 diabetes mellitus with diabetic chronic kidney disease: Secondary | ICD-10-CM | POA: Diagnosis not present

## 2020-09-21 LAB — HEPATIC FUNCTION PANEL
ALT: 25 U/L (ref 0–44)
AST: 22 U/L (ref 15–41)
Albumin: 3.5 g/dL (ref 3.5–5.0)
Alkaline Phosphatase: 97 U/L (ref 38–126)
Bilirubin, Direct: 0.1 mg/dL (ref 0.0–0.2)
Indirect Bilirubin: 0.2 mg/dL — ABNORMAL LOW (ref 0.3–0.9)
Total Bilirubin: 0.3 mg/dL (ref 0.3–1.2)
Total Protein: 7.5 g/dL (ref 6.5–8.1)

## 2020-09-21 LAB — CBG MONITORING, ED: Glucose-Capillary: 353 mg/dL — ABNORMAL HIGH (ref 70–99)

## 2020-09-21 LAB — RESP PANEL BY RT-PCR (FLU A&B, COVID) ARPGX2
Influenza A by PCR: NEGATIVE
Influenza B by PCR: NEGATIVE
SARS Coronavirus 2 by RT PCR: NEGATIVE

## 2020-09-21 LAB — BRAIN NATRIURETIC PEPTIDE: B Natriuretic Peptide: 8.8 pg/mL (ref 0.0–100.0)

## 2020-09-21 LAB — I-STAT VENOUS BLOOD GAS, ED
Acid-Base Excess: 1 mmol/L (ref 0.0–2.0)
Bicarbonate: 26.3 mmol/L (ref 20.0–28.0)
Calcium, Ion: 1.2 mmol/L (ref 1.15–1.40)
HCT: 37 % — ABNORMAL LOW (ref 39.0–52.0)
Hemoglobin: 12.6 g/dL — ABNORMAL LOW (ref 13.0–17.0)
O2 Saturation: 63 %
Patient temperature: 98.3
Potassium: 4.4 mmol/L (ref 3.5–5.1)
Sodium: 134 mmol/L — ABNORMAL LOW (ref 135–145)
TCO2: 28 mmol/L (ref 22–32)
pCO2, Ven: 41.1 mmHg — ABNORMAL LOW (ref 44.0–60.0)
pH, Ven: 7.413 (ref 7.250–7.430)
pO2, Ven: 32 mmHg (ref 32.0–45.0)

## 2020-09-21 LAB — CBC
HCT: 38.3 % — ABNORMAL LOW (ref 39.0–52.0)
Hemoglobin: 13.5 g/dL (ref 13.0–17.0)
MCH: 29.9 pg (ref 26.0–34.0)
MCHC: 35.2 g/dL (ref 30.0–36.0)
MCV: 84.9 fL (ref 80.0–100.0)
Platelets: 333 10*3/uL (ref 150–400)
RBC: 4.51 MIL/uL (ref 4.22–5.81)
RDW: 11.9 % (ref 11.5–15.5)
WBC: 7.6 10*3/uL (ref 4.0–10.5)
nRBC: 0 % (ref 0.0–0.2)

## 2020-09-21 LAB — BASIC METABOLIC PANEL
Anion gap: 11 (ref 5–15)
BUN: 30 mg/dL — ABNORMAL HIGH (ref 8–23)
CO2: 23 mmol/L (ref 22–32)
Calcium: 9.4 mg/dL (ref 8.9–10.3)
Chloride: 95 mmol/L — ABNORMAL LOW (ref 98–111)
Creatinine, Ser: 2.05 mg/dL — ABNORMAL HIGH (ref 0.61–1.24)
GFR, Estimated: 35 mL/min — ABNORMAL LOW (ref >60)
Glucose, Bld: 510 mg/dL (ref 70–99)
Potassium: 4.4 mmol/L (ref 3.5–5.1)
Sodium: 129 mmol/L — ABNORMAL LOW (ref 135–145)

## 2020-09-21 LAB — URINALYSIS, ROUTINE W REFLEX MICROSCOPIC
Bilirubin Urine: NEGATIVE
Glucose, UA: >=500 mg/dL — AB
Hgb urine dipstick: NEGATIVE
Ketones, ur: NEGATIVE mg/dL
Leukocytes,Ua: NEGATIVE
Nitrite: NEGATIVE
Protein, ur: NEGATIVE mg/dL
Specific Gravity, Urine: 1.015 (ref 1.005–1.030)
pH: 5 (ref 5.0–8.0)

## 2020-09-21 LAB — URINALYSIS, MICROSCOPIC (REFLEX): WBC, UA: NONE SEEN WBC/hpf (ref 0–5)

## 2020-09-21 LAB — TROPONIN I (HIGH SENSITIVITY): Troponin I (High Sensitivity): 10 ng/L (ref <18)

## 2020-09-21 LAB — MAGNESIUM: Magnesium: 2.1 mg/dL (ref 1.7–2.4)

## 2020-09-21 MED ORDER — SODIUM CHLORIDE 0.9 % IV BOLUS
1000.0000 mL | Freq: Once | INTRAVENOUS | Status: AC
  Administered 2020-09-21: 1000 mL via INTRAVENOUS

## 2020-09-21 MED ORDER — LACTATED RINGERS IV BOLUS
1000.0000 mL | Freq: Once | INTRAVENOUS | Status: AC
  Administered 2020-09-21: 1000 mL via INTRAVENOUS

## 2020-09-21 MED ORDER — INSULIN REGULAR HUMAN 100 UNIT/ML IJ SOLN
8.0000 [IU] | Freq: Once | INTRAMUSCULAR | Status: DC
  Filled 2020-09-21: qty 3

## 2020-09-21 MED ORDER — INSULIN ASPART PROT & ASPART (70-30 MIX) 100 UNIT/ML ~~LOC~~ SUSP: 8.0000 [IU] | Freq: Once | SUBCUTANEOUS | Status: DC

## 2020-09-21 MED ORDER — INSULIN ASPART 100 UNIT/ML IJ SOLN
8.0000 [IU] | Freq: Once | INTRAMUSCULAR | Status: AC
  Administered 2020-09-21: 8 [IU] via SUBCUTANEOUS

## 2020-09-21 NOTE — ED Provider Notes (Signed)
[facility] DEPARTMENT Provider Note   CSN: [id number] Arrival date & time: 09/21/20  1557     History Chief Complaint  Patient presents with   Shortness of Breath    [name] is a 65 y.o. male who presents with concern for 2 weeks of progressively worsening fatigue after activity.  Denies any chest pain, shortness of breath, palpitations, nausea or vomiting, fevers, chills.  States he does not really feel short of breath he just feels exhausted after short spurts of activity such as walking from his car up his driveway and into the house.  States that this is significantly increased from his baseline though he does at baseline have some fatigue with exertion.  I personally reads patient's medical records.  Has history of cardiomyopathy secondary to coronary artery disease with LVEF of 20%, CKD, OSA, hypercholesterolemia, type 2 diabetes.  Patient is anticoagulated with aspirin and Plavix.  HPI     Past Medical History:  Diagnosis Date   Asthma    CAD (coronary artery disease)    a. non obst dz by cath 2004  b. 11/12/15 which showed no obvious large vessel CAD. No PCI or clear reason for NSTEMI ( possibly hypertensive urgency?).    CARDIOMYOPATHY    a. NICM, LVEF 30% 01/2011 echo; 20-25% 07/2012 echo, EF recovered by echo 06/2015.   Congestive heart failure (CHF) ([place])    DIABETES MELLITUS, TYPE II    HYPERCHOLESTEROLEMIA    HYPERTENSION    Myocardial infarct ([place]) 10/2015   Noncompliance    Prostate cancer ([place])    prostate cancer   PVCs (premature ventricular contractions)    a. s/p ablation 01-07-2013 by Dr Rayann Heman    Patient Active Problem List   Diagnosis Date Noted   Syncope, vasovagal 04/21/2020   Syncope 04/21/2020   COVID-19 virus infection 02/07/2019   Diarrhea 02/07/2019   Acute on chronic renal failure ([place]) 02/06/2019   Routine general medical examination at a health care facility 03/12/2018   Balanitis 10/15/2016   NSTEMI  (non-ST elevated myocardial infarction) ([place]) 11/13/2015   PVCs (premature ventricular contractions)    CAD (coronary artery disease)    Acute kidney injury ([place]) 07/19/2015   AKI (acute kidney injury) ([place]) 07/19/2015   Hyperglycemia 07/12/2015   Prostate cancer ([place]) 02/22/2014   Cholelithiasis and cholecystitis without obstruction 11/16/2013   PVC's (premature ventricular contractions) 01/07/2013   CKD (chronic kidney disease) stage 2, GFR 60-89 ml/min 08/25/2012   OSA (obstructive sleep apnea) [id number]   Chronic systolic heart failure ([place]) 08/06/2012   HTN (hypertension), malignant 02/12/2011   Non compliance w medication regimen 02/12/2011   HYPERCHOLESTEROLEMIA 09/17/2009   PREMATURE VENTRICULAR CONTRACTIONS 09/17/2009   PSA, INCREASED 04/26/2009   Diabetes type 2, uncontrolled ([place]) 04/25/2009   CARDIOMYOPATHY 04/25/2009    Past Surgical History:  Procedure Laterality Date   ABLATION  01-07-2013   RVOT PVC's ablated by Dr Rayann Heman along anteroseptal RVOT   CARDIAC CATHETERIZATION  2004   nonobst dz   CARDIAC CATHETERIZATION N/A 11/12/2015   Procedure: Left Heart Cath and Coronary Angiography;  Surgeon: Sherren Mocha, MD;  Location: [facility];  Service: Cardiovascular;  Laterality: N/A;   CHOLECYSTECTOMY N/A 11/16/2013   Procedure: LAPAROSCOPIC CHOLECYSTECTOMY WITH INTRAOPERATIVE CHOLANGIOGRAM;  Surgeon: [name], MD;  Location: [facility];  Service: General;  Laterality: N/A;   LYMPHADENECTOMY Bilateral 02/22/2014   Procedure: PELVIC LYMPH NODE DISSECTION;  Surgeon: [name], MD;  Location: [facility];  Service: Urology;  Laterality: Bilateral;   PROSTATE SURGERY     ROBOT ASSISTED LAPAROSCOPIC RADICAL PROSTATECTOMY N/A 02/22/2014   Procedure: ROBOTIC ASSISTED LAPAROSCOPIC RADICAL PROSTATECTOMY WITH INDOCYANINE GREEN DYE AND OPEN UMBILICAL HERNIA REPAIR;  Surgeon: [name], MD;  Location: [facility];  Service: Urology;  Laterality: N/A;   SUPRAVENTRICULAR  TACHYCARDIA ABLATION N/A 09/28/2012   Procedure: Tanna Furry Ablation;  Surgeon: [name], MD;  [facility];  Service: Cardiovascular;  Laterality: N/A;   SURGERY SCROTAL / TESTICULAR     at age 78   V-TACH ABLATION N/A 01/07/2013   Procedure: V-TACH ABLATION;  Surgeon: [name], MD;  Location: [facility] C[facility] LAB;  Service: Cardiovascular;  Laterality: N/A;       Family History  Problem Relation Age of Onset   Hypertension Mother    Peripheral vascular disease Mother    Diabetes Mother    Colon cancer Sister    Prostate cancer Brother    Lung cancer Brother    Prostate cancer Brother    Prostate cancer Brother    Prostate cancer Brother    Pancreatic cancer Paternal Aunt    Heart failure Maternal Grandmother    Heart attack Paternal Grandfather    Diabetes Son    Peripheral vascular disease Other    Hypertension Other    Prostate cancer Other    Esophageal cancer Neg Hx    Rectal cancer Neg Hx    Stomach cancer Neg Hx     Social History   Tobacco Use   Smoking status: Never   Smokeless tobacco: Never   Tobacco comments:    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 Authorizing Provider  aspirin EC 81 MG EC tablet Take 1 tablet (81 mg total) by mouth daily. 11/14/15   Eileen Stanford, PA-C  atorvastatin (LIPITOR) 80 MG tablet Take 1 tablet by mouth once daily 04/09/20   Marrian Salvage, FNP  carvedilol (COREG) 12.5 MG tablet Take 1 tablet (12.5 mg total) by mouth 2 (two) times daily with a meal. 06/20/20   Jerline Pain, MD  clopidogrel (PLAVIX) 75 MG tablet Take 1 tablet (75 mg total) by mouth daily. Please make overdue appt with Dr. Marlou Porch before anymore refills. Thank you 1st attempt 01/16/20   Jerline Pain, MD   hydrochlorothiazide (HYDRODIURIL) 25 MG tablet Take 1 tablet by mouth daily. 06/05/20   [provider]  insulin glargine (LANTUS SOLOSTAR) 100 UNIT/ML Solostar Pen Inject 50 Units into the skin every morning. And pen needles 1/day 08/07/20   Renato Shin, MD  isosorbide mononitrate (IMDUR) 60 MG 24 hr tablet Take 1.5 tablets (90 mg total) by mouth daily. 01/30/20   Jerline Pain, MD  lisinopril (ZESTRIL) 20 MG tablet Take 1 tablet (20 mg total) by mouth daily. 05/25/20   Jerline Pain, MD  Multiple Vitamin (MULTIVITAMIN WITH MINERALS) TABS tablet Take 1 tablet by mouth daily. 02/10/19   Hongalgi, Lenis Dickinson, MD  Semaglutide,0.25 or 0.'5MG'$ /DOS, (OZEMPIC, 0.25 OR 0.5 MG/DOSE,) 2 MG/1.5ML SOPN Inject 0.5 mg into the skin once a week. 08/07/20   Renato Shin, MD    Allergies    Ambien [zolpidem tartrate] and Metformin and related  Review of Systems  Review of Systems  Constitutional:  Positive for fatigue. Negative for activity change, appetite change, chills and fever.  HENT: Negative.    Eyes: Negative.   Respiratory:  Positive for shortness of breath. Negative for cough and chest tightness.   Cardiovascular: Negative.   Gastrointestinal: Negative.   Genitourinary: Negative.   Musculoskeletal: Negative.   Skin: Negative.   Neurological: Negative.   Hematological: Negative.    Physical Exam Updated Vital Signs BP 132/88 (BP Location: Right Arm)   Pulse 65   Temp 98.2 F (36.8 C) (Oral)   Resp 16   Ht '5\' 5"'$  (1.651 m)   Wt 79.5 kg   SpO2 100%   BMI 29.15 kg/m   Physical Exam Vitals and nursing note reviewed.  Constitutional:      Appearance: He is well-developed. He is not ill-appearing or toxic-appearing.     Interventions: He is not intubated. HENT:     Head: Normocephalic and atraumatic.     Mouth/Throat:     Mouth: Mucous membranes are moist.     Pharynx: Oropharynx is clear. Uvula midline. No oropharyngeal exudate or posterior oropharyngeal erythema.     Tonsils:  No tonsillar exudate.  Eyes:     General: Lids are normal. Vision grossly intact.        Right eye: No discharge.        Left eye: No discharge.     Extraocular Movements: Extraocular movements intact.     Conjunctiva/sclera: Conjunctivae normal.     Pupils: Pupils are equal, round, and reactive to light.  Neck:     Trachea: Trachea and phonation normal.  Cardiovascular:     Rate and Rhythm: Normal rate and regular rhythm.     Pulses: Normal pulses.     Heart sounds: Normal heart sounds. No murmur heard. Pulmonary:     Effort: Pulmonary effort is normal. No tachypnea, accessory muscle usage or respiratory distress. He is not intubated.     Breath sounds: Normal breath sounds. No decreased breath sounds, wheezing or rales.  Chest:     Chest wall: No mass, swelling, tenderness, crepitus or edema.  Abdominal:     General: Bowel sounds are normal. There is no distension.     Palpations: Abdomen is soft.     Tenderness: There is no abdominal tenderness. There is no right CVA tenderness, left CVA tenderness, guarding or rebound.  Musculoskeletal:        General: No deformity.     Cervical back: Normal range of motion and neck supple. No edema, rigidity or crepitus. No pain with movement, spinous process tenderness or muscular tenderness.     Right lower leg: No edema.     Left lower leg: No edema.  Lymphadenopathy:     Cervical: No cervical adenopathy.  Skin:    General: Skin is warm and dry.     Capillary Refill: Capillary refill takes less than 2 seconds.     Findings: No rash.  Neurological:     General: No focal deficit present.     Mental Status: He is alert. Mental status is at baseline.  Psychiatric:        Mood and Affect: Mood normal.    ED Results / Procedures / Treatments   Labs (all labs ordered are listed, but only abnormal results are displayed) Labs Reviewed  BASIC METABOLIC PANEL - Abnormal; Notable for the following components:      Result Value   Sodium 129  (*)  Chloride 95 (*)    Glucose, Bld 510 (*)    BUN 30 (*)    Creatinine, Ser 2.05 (*)    GFR, Estimated 35 (*)    All other components within normal limits  CBC - Abnormal; Notable for the following components:   HCT 38.3 (*)    All other components within normal limits  URINALYSIS, ROUTINE W REFLEX MICROSCOPIC - Abnormal; Notable for the following components:   Glucose, UA >=500 (*)    All other components within normal limits  HEPATIC FUNCTION PANEL - Abnormal; Notable for the following components:   Indirect Bilirubin 0.2 (*)    All other components within normal limits  URINALYSIS, MICROSCOPIC (REFLEX) - Abnormal; Notable for the following components:   Bacteria, UA RARE (*)    All other components within normal limits  I-STAT VENOUS BLOOD GAS, ED - Abnormal; Notable for the following components:   pCO2, Ven 41.1 (*)    Sodium 134 (*)    HCT 37.0 (*)    Hemoglobin 12.6 (*)    All other components within normal limits  CBG MONITORING, ED - Abnormal; Notable for the following components:   Glucose-Capillary 353 (*)    All other components within normal limits  RESP PANEL BY RT-PCR (FLU A&B, COVID) ARPGX2  BRAIN NATRIURETIC PEPTIDE  BETA-HYDROXYBUTYRIC ACID  MAGNESIUM  TROPONIN I (HIGH SENSITIVITY)    EKG EKG Interpretation  Date/Time:  Friday September 21 2020 16:05:38 EDT Ventricular Rate:  102 PR Interval:  190 QRS Duration: 132 QT Interval:  366 QTC Calculation: 477 R Axis:   -59 Text Interpretation: Sinus tachycardia Left axis deviation Left ventricular hypertrophy with QRS widening and repolarization abnormality ( R in aVL , Cornell product ) Cannot rule out Septal infarct , age undetermined Abnormal ECG Since prior ECG, rate has increased Confirmed by Gareth Morgan [phone]) on 09/21/2020 9:03:56 PM  Radiology DG Chest 2 View  Result Date: 09/21/2020 CLINICAL DATA:  Short of breath, fatigue for 2 weeks EXAM: CHEST - 2 VIEW COMPARISON:  06/21/2019 FINDINGS:  Frontal and lateral views of the chest demonstrate an unremarkable cardiac silhouette. No acute airspace disease, effusion, or pneumothorax. No acute bony abnormalities. IMPRESSION: 1. No acute intrathoracic process. Electronically Signed   By: Randa Ngo M.D.   On: 09/21/2020 17:48    Procedures Procedures   Medications Ordered in ED Medications  lactated ringers bolus 1,000 mL ( Intravenous Stopped 09/21/20 1912)  insulin aspart (novoLOG) injection 8 Units (8 Units Subcutaneous Given 09/21/20 1843)  sodium chloride 0.9 % bolus 1,000 mL ( Intravenous Stopped 09/21/20 2134)    ED Course  I have reviewed the triage vital signs and the nursing notes.  Pertinent labs & imaging results that were available during my care of the patient were reviewed by me and considered in my medical decision making (see chart for details).    MDM Rules/Calculators/A&P                         65 year old male presents with concern for 2 weeks of progressively worsening fatigue with exertion.  Differential diagnosis includes limited to heart failure, MI, PE, pleural effusion, pneumonia, sepsis, metabolic derangement, symptomatic anemia.  Very mildly tachycardic on intake, vital signs otherwise normal.  Cardiopulmonary exam is normal abdominal exam is benign.  Patient is neurovascular intact in all 4 extremities without lower extremity edema.  No focal deficit on neurologic exam.  CBC unremarkable, BMP with mild hyponatremia of  129 and creatinine with very mild AKI the patient's creatinine does appear to be close to 2 at baseline.  Of note patient was hyperglycemic to 510 based on BMP.  UA unremarkable, troponin is normal, 10.  EKG and chest x-ray are very reassuring.  I-STAT VBG obtained given severity of hyperglycemia, with normal pH of 7.41.  Hydroxybutyric acid is normal magnesium is normal, and breast progress panel is negative.  Patient did receive 8 units of subcutaneous insulin as well as 2 L of IV fluid  resuscitation.  Patient was reevaluated after administration of IV fluids with improvement in his blood sugar.  Additionally patient not orthostatic.  Given reassuring work-up today, do not feel patient requires any further work-up in the ED at this time.  While the exact etiology of his fatigue remains unclear, does not appear to be any emergent issue at this time.  No indication for PE given lack of vital sign changes or hypoxia additionally patient is anticoagulated.  Karle Starch voiced understanding with medical evaluation treatment plan.  Each of his questions answered his expressed satisfaction.  Recommend close outpatient cardiology and primary care follow-up.  Strict return precautions were given.  Patient is well-appearing, stable, and appropriate for discharge this time.  This chart was dictated using voice recognition software, Dragon. Despite the best efforts of this provider to proofread and correct errors, errors may still occur which can change documentation meaning.  Final Clinical Impression(s) / ED Diagnoses Final diagnoses:  Hyperglycemia  Dyspnea, unspecified type    Rx / DC Orders ED Discharge Orders     None        Aura Dials 09/22/20 0132    Gareth Morgan, MD 09/24/20 [phone]

## 2020-09-21 NOTE — ED Triage Notes (Signed)
Pt c/o SOB, fatigue x 2 weeks-states sx worse with activity-denies CP/denies fever/flu sx-NAD at present-to triage in w/c-RT in for assessment

## 2020-09-21 NOTE — Discharge Instructions (Addendum)
You are seen in the ER today for your fatigue.  Your blood work, physical exam, and vital signs were very reassuring.  Your blood sugar was quite high, please follow-up with your primary care doctor for management of your blood sugar medicines.  Additionally strongly recommend you follow-up with your cardiologist within the next week for further evaluation and to rule out any underlying cardiac component to your fatigue.  Return to the ER if you develop any chest pain, shortness of breath, palpitations, nausea or vomiting does not stop, or any other new severe symptoms.

## 2020-09-22 LAB — BETA-HYDROXYBUTYRIC ACID: Beta-Hydroxybutyric Acid: 0.08 mmol/L (ref 0.05–0.27)

## 2020-09-24 ENCOUNTER — Telehealth: Payer: Self-pay | Admitting: Endocrinology

## 2020-09-24 NOTE — Telephone Encounter (Signed)
Pt had to go to the hospital on 09/21/2020 due to high blood sugar.  Pt states that Dr.Ellison changed his insulin to ozempic  Blood sugars have been running high. Blood sugar on 09/24/2020 was 400. Pt is experiencing tired and sweating. Tingling in toes for the last two weeks. Pt would like a call back on what to do.

## 2020-09-25 ENCOUNTER — Other Ambulatory Visit: Payer: Self-pay

## 2020-09-25 ENCOUNTER — Ambulatory Visit (INDEPENDENT_AMBULATORY_CARE_PROVIDER_SITE_OTHER): Payer: BC Managed Care – PPO | Admitting: Endocrinology

## 2020-09-25 VITALS — BP 108/84 | HR 103 | Ht 65.0 in | Wt 171.4 lb

## 2020-09-25 DIAGNOSIS — E1165 Type 2 diabetes mellitus with hyperglycemia: Secondary | ICD-10-CM

## 2020-09-25 LAB — POCT GLYCOSYLATED HEMOGLOBIN (HGB A1C): Hemoglobin A1C: 12.8 % — AB (ref 4.0–5.6)

## 2020-09-25 MED ORDER — LANTUS SOLOSTAR 100 UNIT/ML ~~LOC~~ SOPN: 50.0000 [IU] | PEN_INJECTOR | SUBCUTANEOUS | 3 refills | Status: DC

## 2020-09-25 NOTE — Patient Instructions (Addendum)
Please resume the Lantus: 50 units each morning,  Please continue the same Ozempic.  On this type of insulin schedule, you should eat meals on a regular schedule.  If a meal is missed or significantly delayed, your blood sugar could go low.   check your blood sugar twice a day.  vary the time of day when you check, between before the 3 meals, and at bedtime.  also check if you have symptoms of your blood sugar being too high or too low.  please keep a record of the readings and bring it to your next appointment here (or you can bring the meter itself).  You can write it on any piece of paper.  please call us sooner if your blood sugar goes below 70, or if you have a lot of readings over 200.   Please come back for a follow-up appointment in 1 month.

## 2020-09-25 NOTE — Progress Notes (Signed)
Subjective:    Patient ID: [name], male    DOB: [birth year], 65 y.o.   MRN: [id number]  HPI Pt returns for f/u of diabetes mellitus: DM type: Insulin-requiring type 2 Dx'ed: [id number] Complications: CAD and PN Therapy: insulin since mid-2018.   DKA: never Severe hypoglycemia: never Pancreatitis: never Pancreatic imaging: normal on 2002 CT.   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   Other: due to noncompliance, he is not a candidate for multiple daily injections.   Interval history: pt states he feels well in general.  He says he never misses the insulin.  He brings his meter with his cbg's which I have reviewed today.   no cbg record, but states cbg varies from 250-400.  no recent steroids.  He says 1 box of 5 pens lasts 1 month.  On further questioning, pt says he has not recently taken the Lantus.   Past Medical History:  Diagnosis Date   Asthma    CAD (coronary artery disease)    a. non obst dz by cath 2004  b. 11/12/15 which showed no obvious large vessel CAD. No PCI or clear reason for NSTEMI ( possibly hypertensive urgency?).    CARDIOMYOPATHY    a. NICM, LVEF 30% 01/2011 echo; 20-25% 07/2012 echo, EF recovered by echo 06/2015.   Congestive heart failure (CHF) ([place])    DIABETES MELLITUS, TYPE II    HYPERCHOLESTEROLEMIA    HYPERTENSION    Myocardial infarct ([place]) 10/2015   Noncompliance    Prostate cancer ([place])    prostate cancer   PVCs (premature ventricular contractions)    a. s/p ablation 01-07-2013 by Dr Rayann Heman    Past Surgical History:  Procedure Laterality Date   ABLATION  01-07-2013   RVOT PVC's ablated by Dr Rayann Heman along anteroseptal RVOT   CARDIAC CATHETERIZATION  2004   nonobst dz   CARDIAC CATHETERIZATION N/A 11/12/2015   Procedure: Left Heart Cath and Coronary Angiography;  Surgeon: Sherren Mocha, MD;  Location: [place] [facility];  Service: Cardiovascular;  Laterality: N/A;   CHOLECYSTECTOMY N/A 11/16/2013   Procedure: LAPAROSCOPIC  CHOLECYSTECTOMY WITH INTRAOPERATIVE CHOLANGIOGRAM;  Surgeon: [name], MD;  [facility];  Service: General;  Laterality: N/A;   LYMPHADENECTOMY Bilateral 02/22/2014   Procedure: PELVIC LYMPH NODE DISSECTION;  Surgeon: [name], MD;  Location: [facility];  Service: Urology;  Laterality: Bilateral;   PROSTATE SURGERY     ROBOT ASSISTED LAPAROSCOPIC RADICAL PROSTATECTOMY N/A 02/22/2014   Procedure: ROBOTIC ASSISTED LAPAROSCOPIC RADICAL PROSTATECTOMY WITH INDOCYANINE GREEN DYE AND OPEN UMBILICAL HERNIA REPAIR;  Surgeon: [name], MD;  [facility];  Service: Urology;  Laterality: N/A;   SUPRAVENTRICULAR TACHYCARDIA ABLATION N/A 09/28/2012   Procedure: Tanna Furry Ablation;  Surgeon: [name], MD;  Location: [facility] C[facility];  Service: Cardiovascular;  Laterality: N/A;   SURGERY SCROTAL / TESTICULAR     at age 77   V-TACH ABLATION N/A 01/07/2013   Procedure: V-TACH ABLATION;  Surgeon: [name], MD;  Location: [facility] CATH LAB;  Service: Cardiovascular;  Laterality: N/A;    Social History   Socioeconomic History   Marital status: Single    Spouse name: Not on file   Number of children: Not on file   Years of education: Not on file   Highest education level: Not on file  Occupational History   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    Employer: [employer]  Tobacco Use   Smoking status: Never   Smokeless tobacco:  Never   Tobacco comments:    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  Vaping Use   Vaping Use: Never used  Substance and Sexual Activity   Alcohol use: Yes    Comment: rare   Drug use: No   Sexual activity: Never  Other Topics Concern   Not on file  Social History Narrative   Single - divorced x 3 -   Lives with 2 sons, enjoys spending time with 6 g-kids and 3 kids   Social Determinants of Health   Financial Resource Strain: Not on file  Food Insecurity: Not on file  Transportation Needs: Not on file  Physical Activity: Not on file   Stress: Not on file  Social Connections: Not on file  Intimate Partner Violence: Not on file    Current Outpatient Medications on File Prior to Visit  Medication Sig Dispense Refill   aspirin EC 81 MG EC tablet Take 1 tablet (81 mg total) by mouth daily.     atorvastatin (LIPITOR) 80 MG tablet Take 1 tablet by mouth once daily 90 tablet 0   carvedilol (COREG) 12.5 MG tablet Take 1 tablet (12.5 mg total) by mouth 2 (two) times daily with a meal. 60 tablet 6   clopidogrel (PLAVIX) 75 MG tablet Take 1 tablet (75 mg total) by mouth daily. Please make overdue appt with Dr. Marlou Porch before anymore refills. Thank you 1st attempt 90 tablet 3   hydrochlorothiazide (HYDRODIURIL) 25 MG tablet Take 1 tablet by mouth daily.     isosorbide mononitrate (IMDUR) 60 MG 24 hr tablet Take 1.5 tablets (90 mg total) by mouth daily. 135 tablet 3   lisinopril (ZESTRIL) 20 MG tablet Take 1 tablet (20 mg total) by mouth daily. 30 tablet 7   Multiple Vitamin (MULTIVITAMIN WITH MINERALS) TABS tablet Take 1 tablet by mouth daily.     Semaglutide,0.25 or 0.'5MG'$ /DOS, (OZEMPIC, 0.25 OR 0.5 MG/DOSE,) 2 MG/1.5ML SOPN Inject 0.5 mg into the skin once a week. 4.5 mL 3   No current facility-administered medications on file prior to visit.    Allergies  Allergen Reactions   Ambien [Zolpidem Tartrate] Other (See Comments)    Patient stated this medication made him "go crazy"   Metformin And Related Diarrhea    Family History  Problem Relation Age of Onset   Hypertension Mother    Peripheral vascular disease Mother    Diabetes Mother    Colon cancer Sister    Prostate cancer Brother    Lung cancer Brother    Prostate cancer Brother    Prostate cancer Brother    Prostate cancer Brother    Pancreatic cancer Paternal Aunt    Heart failure Maternal Grandmother    Heart attack Paternal Grandfather    Diabetes Son    Peripheral vascular disease Other    Hypertension Other    Prostate cancer Other    Esophageal cancer  Neg Hx    Rectal cancer Neg Hx    Stomach cancer Neg Hx     BP 108/84 (BP Location: Right Arm, Patient Position: Sitting, Cuff Size: Large)   Pulse (!) 103   Ht '5\' 5"'$  (1.651 m)   Wt 171 lb 6.4 oz (77.7 kg)   BMI 28.52 kg/m    Review of Systems He denies hypoglycemia.      Objective:   Physical Exam MSK: no deformity of the feet CV: no leg edema Skin:  no ulcer on the feet.  normal color and  temp on the feet. Neuro: sensation is intact to touch on the feet, but decreased from normal.   Ext: there is bilateral onychomycosis of the toenails.    Lab Results  Component Value Date   HGBA1C 12.8 (A) 09/25/2020      Assessment & Plan:  Insulin-requiring type 2 DM: uncontrolled  Patient Instructions  Please resume the Lantus: 50 units each morning,  Please continue the same Ozempic.  On this type of insulin schedule, you should eat meals on a regular schedule.  If a meal is missed or significantly delayed, your blood sugar could go low.   check your blood sugar twice a day.  vary the time of day when you check, between before the 3 meals, and at bedtime.  also check if you have symptoms of your blood sugar being too high or too low.  please keep a record of the readings and bring it to your next appointment here (or you can bring the meter itself).  You can write it on any piece of paper.  please call us sooner if your blood sugar goes below 70, or if you have a lot of readings over 200.   Please come back for a follow-up appointment in 1 month.

## 2020-09-28 ENCOUNTER — Ambulatory Visit: Payer: BC Managed Care – PPO | Admitting: Endocrinology

## 2020-10-26 ENCOUNTER — Ambulatory Visit: Payer: BC Managed Care – PPO | Admitting: Endocrinology

## 2020-11-15 ENCOUNTER — Ambulatory Visit: Payer: BC Managed Care – PPO | Admitting: Endocrinology

## 2020-12-12 ENCOUNTER — Ambulatory Visit (INDEPENDENT_AMBULATORY_CARE_PROVIDER_SITE_OTHER): Payer: BC Managed Care – PPO | Admitting: Endocrinology

## 2020-12-12 ENCOUNTER — Other Ambulatory Visit: Payer: Self-pay

## 2020-12-12 VITALS — BP 128/82 | HR 89 | Ht 65.0 in | Wt 178.8 lb

## 2020-12-12 DIAGNOSIS — E1165 Type 2 diabetes mellitus with hyperglycemia: Secondary | ICD-10-CM | POA: Diagnosis not present

## 2020-12-12 LAB — POCT GLYCOSYLATED HEMOGLOBIN (HGB A1C): Hemoglobin A1C: 7.5 % — AB (ref 4.0–5.6)

## 2020-12-12 MED ORDER — LANTUS SOLOSTAR 100 UNIT/ML ~~LOC~~ SOPN: 40.0000 [IU] | PEN_INJECTOR | SUBCUTANEOUS | 3 refills | Status: DC

## 2020-12-12 MED ORDER — SEMAGLUTIDE (1 MG/DOSE) 4 MG/3ML ~~LOC~~ SOPN: 1.0000 mg | PEN_INJECTOR | SUBCUTANEOUS | 3 refills | Status: DC

## 2020-12-12 NOTE — Patient Instructions (Addendum)
Please reduce the Lantus to 40 units each morning, and:  I have sent a prescription to your pharmacy, to double the [place].  On this type of insulin schedule, you should eat meals on a regular schedule.  If a meal is missed or significantly delayed, your blood sugar could go low.   check your blood sugar twice a day.  vary the time of day when you check, between before the 3 meals, and at bedtime.  also check if you have symptoms of your blood sugar being too high or too low.  please keep a record of the readings and bring it to your next appointment here (or you can bring the meter itself).  You can write it on any piece of paper.  please call us sooner if your blood sugar goes below 70, or if you have a lot of readings over 200.   Please come back for a follow-up appointment in January.

## 2020-12-12 NOTE — Progress Notes (Signed)
Subjective:    Patient ID: [name], male    DOB: 09/14/1955, 65 y.o.   MRN: [id number]  HPI Pt returns for f/u of diabetes mellitus: DM type: Insulin-requiring type 2 Dx'ed: 2263 Complications: CAD and PN Therapy: insulin since 2018, and Ozempic.  DKA: never Severe hypoglycemia: never Pancreatitis: never Pancreatic imaging: normal on 2002 CT.   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   Other: due to noncompliance, he is not a candidate for multiple daily injections.   Interval history: Meter is downloaded today, and the printout is scanned into the record.  Cbg varies from 106-208.  Pt says he takes meds as rx'ed.   Past Medical History:  Diagnosis Date   Asthma    CAD (coronary artery disease)    a. non obst dz by cath 2004  b. 11/12/15 which showed no obvious large vessel CAD. No PCI or clear reason for NSTEMI ( possibly hypertensive urgency?).    CARDIOMYOPATHY    a. NICM, LVEF 30% 01/2011 echo; 20-25% 07/2012 echo, EF recovered by echo 06/2015.   Congestive heart failure (CHF) ([place])    DIABETES MELLITUS, TYPE II    HYPERCHOLESTEROLEMIA    HYPERTENSION    Myocardial infarct ([place]) 10/2015   Noncompliance    Prostate cancer ([place])    prostate cancer   PVCs (premature ventricular contractions)    a. s/p ablation 01-07-2013 by Dr Rayann Heman    Past Surgical History:  Procedure Laterality Date   ABLATION  01-07-2013   RVOT PVC's ablated by Dr Rayann Heman along anteroseptal RVOT   CARDIAC CATHETERIZATION  2004   nonobst dz   CARDIAC CATHETERIZATION N/A 11/12/2015   Procedure: Left Heart Cath and Coronary Angiography;  Surgeon: Sherren Mocha, MD;  Location: [place] [facility];  Service: Cardiovascular;  Laterality: N/A;   CHOLECYSTECTOMY N/A 11/16/2013   Procedure: LAPAROSCOPIC CHOLECYSTECTOMY WITH INTRAOPERATIVE CHOLANGIOGRAM;  Surgeon: [name], MD;  [facility];  Service: General;  Laterality: N/A;   LYMPHADENECTOMY Bilateral 02/22/2014   Procedure:  PELVIC LYMPH NODE DISSECTION;  Surgeon: [name], MD;  [facility];  Service: Urology;  Laterality: Bilateral;   PROSTATE SURGERY     ROBOT ASSISTED LAPAROSCOPIC RADICAL PROSTATECTOMY N/A 02/22/2014   Procedure: ROBOTIC ASSISTED LAPAROSCOPIC RADICAL PROSTATECTOMY WITH INDOCYANINE GREEN DYE AND OPEN UMBILICAL HERNIA REPAIR;  Surgeon: [name], MD;  [facility];  Service: Urology;  Laterality: N/A;   SUPRAVENTRICULAR TACHYCARDIA ABLATION N/A 09/28/2012   Procedure: Tanna Furry Ablation;  Surgeon: [name], MD;  Location: [facility] CATH LAB;  Service: Cardiovascular;  Laterality: N/A;   SURGERY SCROTAL / TESTICULAR     at age 78   V-TACH ABLATION N/A 01/07/2013   Procedure: V-TACH ABLATION;  Surgeon: [name], MD;  Location: [facility] CATH LAB;  Service: Cardiovascular;  Laterality: N/A;    Social History   Socioeconomic History   Marital status: Single    Spouse name: Not on file   Number of children: Not on file   Years of education: Not on file   Highest education level: Not on file  Occupational History   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    Employer: [employer]  Tobacco Use   Smoking status: Never   Smokeless tobacco: Never   Tobacco comments:    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  Vaping Use   Vaping Use: Never used  Substance and Sexual Activity   Alcohol use: Yes    Comment: rare  Drug use: No   Sexual activity: Never  Other Topics Concern   Not on file  Social History Narrative   Single - divorced x 3 -   Lives with 2 sons, enjoys spending time with 6 g-kids and 3 kids   Social Determinants of Health   Financial Resource Strain: Not on file  Food Insecurity: Not on file  Transportation Needs: Not on file  Physical Activity: Not on file  Stress: Not on file  Social Connections: Not on file  Intimate Partner Violence: Not on file    Current Outpatient Medications on File Prior to Visit  Medication Sig Dispense Refill   aspirin  EC 81 MG EC tablet Take 1 tablet (81 mg total) by mouth daily.     atorvastatin (LIPITOR) 80 MG tablet Take 1 tablet by mouth once daily 90 tablet 0   carvedilol (COREG) 12.5 MG tablet Take 1 tablet (12.5 mg total) by mouth 2 (two) times daily with a meal. 60 tablet 6   clopidogrel (PLAVIX) 75 MG tablet Take 1 tablet (75 mg total) by mouth daily. Please make overdue appt with Dr. Marlou Porch before anymore refills. Thank you 1st attempt 90 tablet 3   hydrochlorothiazide (HYDRODIURIL) 25 MG tablet Take 1 tablet by mouth daily.     isosorbide mononitrate (IMDUR) 60 MG 24 hr tablet Take 1.5 tablets (90 mg total) by mouth daily. 135 tablet 3   lisinopril (ZESTRIL) 20 MG tablet Take 1 tablet (20 mg total) by mouth daily. 30 tablet 7   Multiple Vitamin (MULTIVITAMIN WITH MINERALS) TABS tablet Take 1 tablet by mouth daily.     No current facility-administered medications on file prior to visit.    Allergies  Allergen Reactions   Ambien [Zolpidem Tartrate] Other (See Comments)    Patient stated this medication made him "go crazy"   Metformin And Related Diarrhea    Family History  Problem Relation Age of Onset   Hypertension Mother    Peripheral vascular disease Mother    Diabetes Mother    Colon cancer Sister    Prostate cancer Brother    Lung cancer Brother    Prostate cancer Brother    Prostate cancer Brother    Prostate cancer Brother    Pancreatic cancer Paternal Aunt    Heart failure Maternal Grandmother    Heart attack Paternal Grandfather    Diabetes Son    Peripheral vascular disease Other    Hypertension Other    Prostate cancer Other    Esophageal cancer Neg Hx    Rectal cancer Neg Hx    Stomach cancer Neg Hx     BP 128/82 (BP Location: Right Arm, Patient Position: Sitting, Cuff Size: Normal)   Pulse 89   Ht 5\' 5"  (1.651 m)   Wt 178 lb 12.8 oz (81.1 kg)   SpO2 99%   BMI 29.75 kg/m   Review of Systems He denies hypoglycemia.    Objective:   Physical  Exam    A1c=7.5%    Assessment & Plan:  Insulin-requiring type 2 DM: uncontrolled.    Patient Instructions  Please reduce the Lantus to 40 units each morning, and:  I have sent a prescription to your pharmacy, to double the [place].  On this type of insulin schedule, you should eat meals on a regular schedule.  If a meal is missed or significantly delayed, your blood sugar could go low.   check your blood sugar twice a day.  vary the time of  day when you check, between before the 3 meals, and at bedtime.  also check if you have symptoms of your blood sugar being too high or too low.  please keep a record of the readings and bring it to your next appointment here (or you can bring the meter itself).  You can write it on any piece of paper.  please call us sooner if your blood sugar goes below 70, or if you have a lot of readings over 200.   Please come back for a follow-up appointment in January.

## 2020-12-28 ENCOUNTER — Other Ambulatory Visit: Payer: Self-pay | Admitting: Cardiology

## 2021-01-01 ENCOUNTER — Other Ambulatory Visit: Payer: Self-pay

## 2021-01-01 MED ORDER — LISINOPRIL 20 MG PO TABS: 20.0000 mg | ORAL_TABLET | Freq: Every day | ORAL | 6 refills | Status: DC

## 2021-01-01 MED ORDER — CLOPIDOGREL BISULFATE 75 MG PO TABS: 75.0000 mg | ORAL_TABLET | Freq: Every day | ORAL | 1 refills | Status: DC

## 2021-02-08 ENCOUNTER — Other Ambulatory Visit: Payer: Self-pay | Admitting: Cardiology

## 2021-02-14 ENCOUNTER — Ambulatory Visit (INDEPENDENT_AMBULATORY_CARE_PROVIDER_SITE_OTHER): Payer: BC Managed Care – PPO | Admitting: Endocrinology

## 2021-02-14 ENCOUNTER — Ambulatory Visit: Payer: BC Managed Care – PPO | Admitting: Endocrinology

## 2021-02-14 ENCOUNTER — Other Ambulatory Visit: Payer: Self-pay

## 2021-02-14 VITALS — BP 142/92 | HR 87 | Ht 65.0 in | Wt 174.2 lb

## 2021-02-14 DIAGNOSIS — E1165 Type 2 diabetes mellitus with hyperglycemia: Secondary | ICD-10-CM | POA: Diagnosis not present

## 2021-02-14 LAB — POCT GLYCOSYLATED HEMOGLOBIN (HGB A1C): Hemoglobin A1C: 7 % — AB (ref 4.0–5.6)

## 2021-02-14 MED ORDER — LANTUS SOLOSTAR 100 UNIT/ML ~~LOC~~ SOPN: 30.0000 [IU] | PEN_INJECTOR | SUBCUTANEOUS | 3 refills | Status: DC

## 2021-02-14 MED ORDER — ACCU-CHEK GUIDE VI STRP: 1.0000 | ORAL_STRIP | Freq: Two times a day (BID) | 3 refills | Status: DC

## 2021-02-14 MED ORDER — OZEMPIC (2 MG/DOSE) 8 MG/3ML ~~LOC~~ SOPN: 2.0000 mg | PEN_INJECTOR | SUBCUTANEOUS | 3 refills | Status: DC

## 2021-02-14 NOTE — Progress Notes (Signed)
Subjective:    Patient ID: [name], male    DOB: [birth year], 67 y.o.   MRN: [id number]  HPI Pt returns for f/u of diabetes mellitus: DM type: Insulin-requiring type 2 Dx'ed: 5643 Complications: CAD and PN Therapy: insulin since 2018, and Ozempic.  DKA: never Severe hypoglycemia: never Pancreatitis: never Pancreatic imaging: normal on 2002 CT.   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   Other: due to noncompliance, he is not a candidate for multiple daily injections.   Interval history: Meter is downloaded today, and the printout is scanned into the record.  Cbg varies from 114-160.  Pt says he takes meds as rx'ed.   Past Medical History:  Diagnosis Date   Asthma    CAD (coronary artery disease)    a. non obst dz by cath 2004  b. 11/12/15 which showed no obvious large vessel CAD. No PCI or clear reason for NSTEMI ( possibly hypertensive urgency?).    CARDIOMYOPATHY    a. NICM, LVEF 30% 01/2011 echo; 20-25% 07/2012 echo, EF recovered by echo 06/2015.   Congestive heart failure (CHF) ([place])    DIABETES MELLITUS, TYPE II    HYPERCHOLESTEROLEMIA    HYPERTENSION    Myocardial infarct ([place]) 10/2015   Noncompliance    Prostate cancer ([place])    prostate cancer   PVCs (premature ventricular contractions)    a. s/p ablation 01-07-2013 by Dr Rayann Heman    Past Surgical History:  Procedure Laterality Date   ABLATION  01-07-2013   RVOT PVC's ablated by Dr Rayann Heman along anteroseptal RVOT   CARDIAC CATHETERIZATION  2004   nonobst dz   CARDIAC CATHETERIZATION N/A 11/12/2015   Procedure: Left Heart Cath and Coronary Angiography;  Surgeon: Sherren Mocha, MD;  Location: [place] [facility];  Service: Cardiovascular;  Laterality: N/A;   CHOLECYSTECTOMY N/A 11/16/2013   Procedure: LAPAROSCOPIC CHOLECYSTECTOMY WITH INTRAOPERATIVE CHOLANGIOGRAM;  Surgeon: [name], MD;  [facility];  Service: General;  Laterality: N/A;   LYMPHADENECTOMY Bilateral 02/22/2014   Procedure:  PELVIC LYMPH NODE DISSECTION;  Surgeon: [name], MD;  [facility];  Service: Urology;  Laterality: Bilateral;   PROSTATE SURGERY     ROBOT ASSISTED LAPAROSCOPIC RADICAL PROSTATECTOMY N/A 02/22/2014   Procedure: ROBOTIC ASSISTED LAPAROSCOPIC RADICAL PROSTATECTOMY WITH INDOCYANINE GREEN DYE AND OPEN UMBILICAL HERNIA REPAIR;  Surgeon: [name], MD;  [facility];  Service: Urology;  Laterality: N/A;   SUPRAVENTRICULAR TACHYCARDIA ABLATION N/A 09/28/2012   Procedure: Tanna Furry Ablation;  Surgeon: [name], MD;  Location: [facility] CATH LAB;  Service: Cardiovascular;  Laterality: N/A;   SURGERY SCROTAL / TESTICULAR     at age 16   V-TACH ABLATION N/A 01/07/2013   Procedure: V-TACH ABLATION;  Surgeon: [name], MD;  Location: [facility] CATH LAB;  Service: Cardiovascular;  Laterality: N/A;    Social History   Socioeconomic History   Marital status: Single    Spouse name: Not on file   Number of children: Not on file   Years of education: Not on file   Highest education level: Not on file  Occupational History   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    Employer: [employer]  Tobacco Use   Smoking status: Never   Smokeless tobacco: Never   Tobacco comments:    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  Vaping Use   Vaping Use: Never used  Substance and Sexual Activity   Alcohol use: Yes    Comment: rare  Drug use: No   Sexual activity: Never  Other Topics Concern   Not on file  Social History Narrative   Single - divorced x 3 -   Lives with 2 sons, enjoys spending time with 6 g-kids and 3 kids   Social Determinants of Health   Financial Resource Strain: Not on file  Food Insecurity: Not on file  Transportation Needs: Not on file  Physical Activity: Not on file  Stress: Not on file  Social Connections: Not on file  Intimate Partner Violence: Not on file    Current Outpatient Medications on File Prior to Visit  Medication Sig Dispense Refill   aspirin  EC 81 MG EC tablet Take 1 tablet (81 mg total) by mouth daily.     atorvastatin (LIPITOR) 80 MG tablet Take 1 tablet by mouth once daily 90 tablet 0   carvedilol (COREG) 12.5 MG tablet TAKE 1 TABLET BY MOUTH TWICE DAILY WITH A MEAL 60 tablet 5   clopidogrel (PLAVIX) 75 MG tablet Take 1 tablet (75 mg total) by mouth daily. 90 tablet 1   hydrochlorothiazide (HYDRODIURIL) 25 MG tablet Take 1 tablet by mouth once daily 30 tablet 4   isosorbide mononitrate (IMDUR) 60 MG 24 hr tablet TAKE 1 & 1/2 (ONE & ONE-HALF) TABLETS BY MOUTH ONCE DAILY 45 tablet 4   lisinopril (ZESTRIL) 20 MG tablet Take 1 tablet (20 mg total) by mouth daily. 30 tablet 6   Multiple Vitamin (MULTIVITAMIN WITH MINERALS) TABS tablet Take 1 tablet by mouth daily.     No current facility-administered medications on file prior to visit.    Allergies  Allergen Reactions   Ambien [Zolpidem Tartrate] Other (See Comments)    Patient stated this medication made him "go crazy"   Metformin And Related Diarrhea    Family History  Problem Relation Age of Onset   Hypertension Mother    Peripheral vascular disease Mother    Diabetes Mother    Colon cancer Sister    Prostate cancer Brother    Lung cancer Brother    Prostate cancer Brother    Prostate cancer Brother    Prostate cancer Brother    Pancreatic cancer Paternal Aunt    Heart failure Maternal Grandmother    Heart attack Paternal Grandfather    Diabetes Son    Peripheral vascular disease Other    Hypertension Other    Prostate cancer Other    Esophageal cancer Neg Hx    Rectal cancer Neg Hx    Stomach cancer Neg Hx     BP (!) 142/92    Pulse 87    Ht 5\' 5"  (1.651 m)    Wt 174 lb 3.2 oz (79 kg)    SpO2 98%    BMI 28.99 kg/m    Review of Systems He denies hypoglycemia.      Objective:   Physical Exam    Lab Results  Component Value Date   HGBA1C 7.0 (A) 02/14/2021      Assessment & Plan:  Insulin-requiring type 2 DM: overcontrolled  Patient  Instructions  Please reduce the Lantus to 30 units each morning, and:  I have sent a prescription to your pharmacy, to double the [place] again.  On this type of insulin schedule, you should eat meals on a regular schedule.  If a meal is missed or significantly delayed, your blood sugar could go low.   check your blood sugar twice a day.  vary the time of day when you  check, between before the 3 meals, and at bedtime.  also check if you have symptoms of your blood sugar being too high or too low.  please keep a record of the readings and bring it to your next appointment here (or you can bring the meter itself).  You can write it on any piece of paper.  please call us sooner if your blood sugar goes below 70, or if you have a lot of readings over 200.   Please come back for a follow-up appointment in 2-3 months.

## 2021-02-14 NOTE — Patient Instructions (Addendum)
Please reduce the Lantus to 30 units each morning, and:  I have sent a prescription to your pharmacy, to double the [place] again.  On this type of insulin schedule, you should eat meals on a regular schedule.  If a meal is missed or significantly delayed, your blood sugar could go low.   check your blood sugar twice a day.  vary the time of day when you check, between before the 3 meals, and at bedtime.  also check if you have symptoms of your blood sugar being too high or too low.  please keep a record of the readings and bring it to your next appointment here (or you can bring the meter itself).  You can write it on any piece of paper.  please call us sooner if your blood sugar goes below 70, or if you have a lot of readings over 200.   Please come back for a follow-up appointment in 2-3 months.

## 2021-03-25 ENCOUNTER — Other Ambulatory Visit: Payer: Self-pay | Admitting: Family

## 2021-04-11 ENCOUNTER — Other Ambulatory Visit: Payer: Self-pay

## 2021-04-11 ENCOUNTER — Encounter (HOSPITAL_BASED_OUTPATIENT_CLINIC_OR_DEPARTMENT_OTHER): Payer: Self-pay

## 2021-04-11 ENCOUNTER — Emergency Department (HOSPITAL_BASED_OUTPATIENT_CLINIC_OR_DEPARTMENT_OTHER): Payer: BC Managed Care – PPO

## 2021-04-11 ENCOUNTER — Emergency Department (HOSPITAL_BASED_OUTPATIENT_CLINIC_OR_DEPARTMENT_OTHER)
Admission: EM | Admit: 2021-04-11 | Discharge: 2021-04-11 | Disposition: A | Discharge status: Home / Self Care | Payer: BC Managed Care – PPO | Attending: Emergency Medicine | Admitting: Emergency Medicine

## 2021-04-11 DIAGNOSIS — Z794 Long term (current) use of insulin: Secondary | ICD-10-CM | POA: Insufficient documentation

## 2021-04-11 DIAGNOSIS — E861 Hypovolemia: Secondary | ICD-10-CM | POA: Diagnosis not present

## 2021-04-11 DIAGNOSIS — R42 Dizziness and giddiness: Secondary | ICD-10-CM | POA: Diagnosis present

## 2021-04-11 DIAGNOSIS — Z79899 Other long term (current) drug therapy: Secondary | ICD-10-CM | POA: Diagnosis not present

## 2021-04-11 DIAGNOSIS — R197 Diarrhea, unspecified: Secondary | ICD-10-CM | POA: Diagnosis not present

## 2021-04-11 DIAGNOSIS — I959 Hypotension, unspecified: Secondary | ICD-10-CM | POA: Insufficient documentation

## 2021-04-11 DIAGNOSIS — I2510 Atherosclerotic heart disease of native coronary artery without angina pectoris: Secondary | ICD-10-CM | POA: Diagnosis not present

## 2021-04-11 DIAGNOSIS — Z7982 Long term (current) use of aspirin: Secondary | ICD-10-CM | POA: Diagnosis not present

## 2021-04-11 DIAGNOSIS — E119 Type 2 diabetes mellitus without complications: Secondary | ICD-10-CM | POA: Insufficient documentation

## 2021-04-11 DIAGNOSIS — I110 Hypertensive heart disease with heart failure: Secondary | ICD-10-CM | POA: Diagnosis not present

## 2021-04-11 DIAGNOSIS — I509 Heart failure, unspecified: Secondary | ICD-10-CM | POA: Diagnosis not present

## 2021-04-11 LAB — URINALYSIS, ROUTINE W REFLEX MICROSCOPIC
Bilirubin Urine: NEGATIVE
Glucose, UA: NEGATIVE mg/dL
Hgb urine dipstick: NEGATIVE
Ketones, ur: NEGATIVE mg/dL
Leukocytes,Ua: NEGATIVE
Nitrite: NEGATIVE
Protein, ur: NEGATIVE mg/dL
Specific Gravity, Urine: 1.025 (ref 1.005–1.030)
pH: 5 (ref 5.0–8.0)

## 2021-04-11 LAB — COMPREHENSIVE METABOLIC PANEL
ALT: 31 U/L (ref 0–44)
AST: 29 U/L (ref 15–41)
Albumin: 3.7 g/dL (ref 3.5–5.0)
Alkaline Phosphatase: 89 U/L (ref 38–126)
Anion gap: 11 (ref 5–15)
BUN: 26 mg/dL — ABNORMAL HIGH (ref 8–23)
CO2: 21 mmol/L — ABNORMAL LOW (ref 22–32)
Calcium: 8.8 mg/dL — ABNORMAL LOW (ref 8.9–10.3)
Chloride: 105 mmol/L (ref 98–111)
Creatinine, Ser: 1.94 mg/dL — ABNORMAL HIGH (ref 0.61–1.24)
GFR, Estimated: 37 mL/min — ABNORMAL LOW (ref >60)
Glucose, Bld: 137 mg/dL — ABNORMAL HIGH (ref 70–99)
Potassium: 3.5 mmol/L (ref 3.5–5.1)
Sodium: 137 mmol/L (ref 135–145)
Total Bilirubin: 0.7 mg/dL (ref 0.3–1.2)
Total Protein: 7.9 g/dL (ref 6.5–8.1)

## 2021-04-11 LAB — CBC WITH DIFFERENTIAL/PLATELET
Abs Immature Granulocytes: 0.02 10*3/uL (ref 0.00–0.07)
Basophils Absolute: 0 10*3/uL (ref 0.0–0.1)
Basophils Relative: 0 %
Eosinophils Absolute: 0.1 10*3/uL (ref 0.0–0.5)
Eosinophils Relative: 1 %
HCT: 41.5 % (ref 39.0–52.0)
Hemoglobin: 13.9 g/dL (ref 13.0–17.0)
Immature Granulocytes: 0 %
Lymphocytes Relative: 39 %
Lymphs Abs: 1.8 10*3/uL (ref 0.7–4.0)
MCH: 30.2 pg (ref 26.0–34.0)
MCHC: 33.5 g/dL (ref 30.0–36.0)
MCV: 90 fL (ref 80.0–100.0)
Monocytes Absolute: 0.8 10*3/uL (ref 0.1–1.0)
Monocytes Relative: 18 %
Neutro Abs: 1.9 10*3/uL (ref 1.7–7.7)
Neutrophils Relative %: 42 %
Platelets: 282 10*3/uL (ref 150–400)
RBC: 4.61 MIL/uL (ref 4.22–5.81)
RDW: 13.3 % (ref 11.5–15.5)
WBC: 4.6 10*3/uL (ref 4.0–10.5)
nRBC: 0 % (ref 0.0–0.2)

## 2021-04-11 LAB — BRAIN NATRIURETIC PEPTIDE: B Natriuretic Peptide: 9.3 pg/mL (ref 0.0–100.0)

## 2021-04-11 LAB — TROPONIN I (HIGH SENSITIVITY): Troponin I (High Sensitivity): 13 ng/L (ref <18)

## 2021-04-11 MED ORDER — SODIUM CHLORIDE 0.9 % IV BOLUS
500.0000 mL | Freq: Once | INTRAVENOUS | Status: AC
  Administered 2021-04-11: 500 mL via INTRAVENOUS

## 2021-04-11 NOTE — ED Triage Notes (Signed)
Pt reports beginning Tuesday, after eating dinner, felt nauseous, vomited several times, followed by having diarrhea, now fatigue, dizziness, able to keep down fluids ?

## 2021-04-11 NOTE — ED Notes (Signed)
Pt ambulatory to BR by himself ?

## 2021-04-11 NOTE — ED Provider Notes (Signed)
?  Physical Exam  ?BP 106/71   Pulse 85   Temp 97.9 ?F (36.6 ?C) (Oral)   Resp 18   Ht '5\' 5"'$  (1.651 m)   Wt 81.6 kg   SpO2 99%   BMI 29.95 kg/m?  ? ?Physical Exam ? ?Procedures  ?Procedures ? ?ED Course / MDM  ? ?Clinical Course as of 04/11/21 1642  ?Thu Apr 11, 2021  ?1330 Supine HR 79, BP 116/78 ?Sitting HR 90, BP 104/78 ?Standing HR 100, BP BP 86/66 [LM]  ?  ?Clinical Course User Index ?[LM] Tacy Learn, PA-C  ? ?Medical Decision Making ?Amount and/or Complexity of Data Reviewed ?Labs: ordered. ?Radiology: ordered. ? ? ?Care of this patient assumed from preceding ED provider Marga Hoots, PA-C at time of shift change.  Please see her associated note for further insight of the patient's ED course.  In brief patient is a 66 year old gentleman with history of diabetes, CHF with preserved ejection fraction, hypertension, hyperlipidemia, and CAD who presents few days after foodborne illness during which time he had vomiting and profuse diarrhea.  He presents for lightheadedness and feeling short of breath and fatigue with exertion. ? ?Was significantly orthostatic on evaluation prior to my arrival.  CBC is without leukocytosis or anemia.  CMP with normal LFTs and baseline creatinine 1.94.  UA without signs of infection. ? ?At time of shift change patient is receiving second half of his 1 L bolus; patient was evaluated by attending physician prior to my arrival as well and plan remains to discharge patient if he is symptomatically improved following fluid bolus. ? ?Patient reevaluated after fluid bolus with complete resolution of his lightheadedness.  He is able to ambulate briskly up and down the hallway without shortness of breath, lightheadedness, dizziness, or syncope.  He is feeling very well and requesting be discharged at this time. ? ?Suspect etiology of patient symptoms was hypovolemia, which has not been corrected intravenously.  No further work-up warranted in the ER at this time.  Clinical  suspicion for more emergent underlying etiology that would warrant further ED work-up or inpatient management is exceedingly low.  Recommend close follow-up with his primary care doctor, increased hydration at home for the next week, and as needed over-the-counter antidiarrheals. ? ?[place] voiced understanding of medical evaluation and treatment plan.  Each of his questions was answered to his expressed satisfaction.  Strict return precautions were given.  Patient is well-appearing, stable, and was discharged in good condition. ? ?This chart was dictated using voice recognition software, Dragon. Despite the best efforts of this provider to proofread and correct errors, errors may still occur which can change documentation meaning. ? ? ? ?  ?Emeline Darling, PA-C ?04/11/21 1642 ? ?  ?Blanchie Dessert, MD ?04/12/21 [phone] ? ?

## 2021-04-11 NOTE — ED Provider Notes (Signed)
?[facility] EMERGENCY DEPARTMENT ?Provider Note ? ? ?CSN: [id number] ?Arrival date & time: 04/11/21  1120 ? ?  ? ?History ? ?Chief Complaint  ?Patient presents with  ? Abdominal Pain  ?  N/v/d, fatigue  ? ? ?[name] is a 66 y.o. male. ? ?66 year old male with past medical history of diabetes, CHF, hypertension, hyperlipidemia, coronary artery disease, presents with complaint of vomiting and diarrhea for the past few days, now feeling dizzy upon standing today.  Patient states that he ate chicken feet and rice on Tuesday (2 days ago), began feeling very unwell afterwards followed by vomiting and diarrhea.  Patient has been able to control the vomiting and diarrhea, last episode yesterday, states he woke up today and when he stood up he felt very dizzy and short of breath. ? ? ?  ? ?Home Medications ?Prior to Admission medications   ?Medication Sig Start Date End Date Taking? Authorizing Provider  ?aspirin EC 81 MG EC tablet Take 1 tablet (81 mg total) by mouth daily. 11/14/15   Eileen Stanford, PA-C  ?atorvastatin (LIPITOR) 80 MG tablet Take 1 tablet by mouth once daily 04/09/20   Marrian Salvage, FNP  ?carvedilol (COREG) 12.5 MG tablet TAKE 1 TABLET BY MOUTH TWICE DAILY WITH A MEAL 12/28/20   Jerline Pain, MD  ?clopidogrel (PLAVIX) 75 MG tablet Take 1 tablet (75 mg total) by mouth daily. 01/01/21   Jerline Pain, MD  ?glucose blood (ACCU-CHEK GUIDE) test strip 1 each by Other route 2 (two) times daily. And lancets 2/day 02/14/21   Renato Shin, MD  ?hydrochlorothiazide (HYDRODIURIL) 25 MG tablet Take 1 tablet by mouth once daily 02/08/21   Jerline Pain, MD  ?insulin glargine (LANTUS SOLOSTAR) 100 UNIT/ML Solostar Pen Inject 30 Units into the skin every morning. And pen needles 1/day 02/14/21   Renato Shin, MD  ?isosorbide mononitrate (IMDUR) 60 MG 24 hr tablet TAKE 1 & 1/2 (ONE & ONE-HALF) TABLETS BY MOUTH ONCE DAILY 02/08/21   Jerline Pain, MD  ?lisinopril (ZESTRIL) 20 MG tablet  Take 1 tablet (20 mg total) by mouth daily. 01/01/21   Jerline Pain, MD  ?Multiple Vitamin (MULTIVITAMIN WITH MINERALS) TABS tablet Take 1 tablet by mouth daily. 02/10/19   Hongalgi, Lenis Dickinson, MD  ?Semaglutide, 2 MG/DOSE, (OZEMPIC, 2 MG/DOSE,) 8 MG/3ML SOPN Inject 2 mg into the skin once a week. 02/14/21   Renato Shin, MD  ?   ? ?Allergies    ?Ambien [zolpidem tartrate] and Metformin and related   ? ?Review of Systems   ?Review of Systems ? ?Physical Exam ?Updated Vital Signs ?BP 105/73   Pulse 91   Temp 97.9 ?F (36.6 ?C) (Oral)   Resp 12   Ht '5\' 5"'$  (1.651 m)   Wt 81.6 kg   SpO2 95%   BMI 29.95 kg/m?  ?Physical Exam ?Vitals and nursing note reviewed.  ?Constitutional:   ?   General: He is not in acute distress. ?   Appearance: He is well-developed. He is not diaphoretic.  ?HENT:  ?   Head: Normocephalic and atraumatic.  ?   Nose: Nose normal.  ?   Mouth/Throat:  ?   Mouth: Mucous membranes are moist.  ?Eyes:  ?   Conjunctiva/sclera: Conjunctivae normal.  ?Cardiovascular:  ?   Rate and Rhythm: Normal rate and regular rhythm.  ?   Heart sounds: Normal heart sounds.  ?Pulmonary:  ?   Effort: Pulmonary effort is normal.  ?  Breath sounds: Normal breath sounds.  ?Abdominal:  ?   Palpations: Abdomen is soft.  ?   Tenderness: There is no abdominal tenderness.  ?Musculoskeletal:  ?   Right lower leg: No edema.  ?   Left lower leg: No edema.  ?Skin: ?   General: Skin is warm and dry.  ?   Findings: No erythema or rash.  ?Neurological:  ?   Mental Status: He is alert and oriented to person, place, and time.  ?Psychiatric:     ?   Behavior: Behavior normal.  ? ? ?ED Results / Procedures / Treatments   ?Labs ?(all labs ordered are listed, but only abnormal results are displayed) ?Labs Reviewed  ?COMPREHENSIVE METABOLIC PANEL - Abnormal; Notable for the following components:  ?    Result Value  ? CO2 21 (*)   ? Glucose, Bld 137 (*)   ? BUN 26 (*)   ? Creatinine, Ser 1.94 (*)   ? Calcium 8.8 (*)   ? GFR, Estimated 37 (*)    ? All other components within normal limits  ?URINALYSIS, ROUTINE W REFLEX MICROSCOPIC - Abnormal; Notable for the following components:  ? Color, Urine AMBER (*)   ? APPearance HAZY (*)   ? All other components within normal limits  ?CBC WITH DIFFERENTIAL/PLATELET  ?TROPONIN I (HIGH SENSITIVITY)  ? ? ?EKG ?EKG Interpretation ? ?Date/Time:  Thursday April 11 2021 11:51:11 EDT ?Ventricular Rate:  98 ?PR Interval:  194 ?QRS Duration: 115 ?QT Interval:  355 ?QTC Calculation: 086 ?R Axis:   -85 ?Text Interpretation: Sinus rhythm Incomplete left bundle branch block Probable left ventricular hypertrophy No significant change since last tracing Confirmed by Blanchie Dessert [phone]) on 04/11/2021 12:19:47 PM ? ?Radiology ?DG Chest Port 1 View ? ?Result Date: 04/11/2021 ?CLINICAL DATA:  Shortness of breath EXAM: PORTABLE CHEST 1 VIEW COMPARISON:  09/21/2020 FINDINGS: Numerous leads and wires project over the chest. Midline trachea.  Normal heart size and mediastinal contours. Sharp costophrenic angles.  No pneumothorax.  Clear lungs. IMPRESSION: No active disease. Electronically Signed   By: Abigail Miyamoto M.D.   On: 04/11/2021 12:31   ? ?Procedures ?Procedures  ? ? ?Medications Ordered in ED ?Medications  ?sodium chloride 0.9 % bolus 500 mL (has no administration in time range)  ?sodium chloride 0.9 % bolus 500 mL (0 mLs Intravenous Stopped 04/11/21 1259)  ? ? ?ED Course/ Medical Decision Making/ A&P ?Clinical Course as of 04/11/21 1351  ?Thu Apr 11, 2021  ?1330 Supine HR 79, BP 116/78 ?Sitting HR 90, BP 104/78 ?Standing HR 100, BP BP 86/66 [LM]  ?  ?Clinical Course User Index ?[LM] Tacy Learn, PA-C  ? ?                        ?Medical Decision Making ?Amount and/or Complexity of Data Reviewed ?Labs: ordered. ?Radiology: ordered. ? ? ?This patient presents to the ED for concern of feeling dizzy upon standing today after having vomiting and diarrhea for the past 2 days, this involves an extensive number of treatment  options, and is a complaint that carries with it a high risk of complications and morbidity.  The differential diagnosis includes but not limited to dehydration, CHF,  ? ? ?Co morbidities that complicate the patient evaluation ? ?CAD, CHF (EF 60-65% on 04/21/20 echo), DM ? ? ?Additional history obtained: ? ?External records from outside source obtained and reviewed including 03/2020 echo  ? ? ?Lab Tests: ? ?I  Ordered, and personally interpreted labs.  The pertinent results include:  CBC with normal WBC, CMP with Cr 1.94, BUN 26, not significantly changed from prior. UA hazy. Trop 13, not significantly changed from priors  ? ? ?Imaging Studies ordered: ? ?I ordered imaging studies including CXR  ?I independently visualized and interpreted imaging which showed no acute disease  ?I agree with the radiologist interpretation ? ? ?Cardiac Monitoring: ? ?The patient was maintained on a cardiac monitor.  I personally viewed and interpreted the cardiac monitored which showed an underlying rhythm of: sinus rhythm  ? ? ?Medicines ordered and prescription drug management: ? ?I ordered medication including IV fluids  for dehydration/hypovolemia   ?Reevaluation of the patient after these medicines showed that the patient stayed the same, additional bolus ordered  ?I have reviewed the patients home medicines and have made adjustments as needed ? ? ?Test Considered: ? ?CT abdomen/pelvis however no abdominal tenderness on exam and reports symptoms improved  ? ? ?Critical Interventions: ? ?IV fluids for hypotension ? ?Problem List / ED Course: ? ?66 year old male presents with complaint of feeling dizzy and fatigued today after having vomiting and diarrhea for the past 2 days.  Patient's GI symptoms have resolved as of yesterday.  He denies chest pain or abdominal pain, fevers, blood in his emesis or stools.  Patient arrives in the emergency room hypotensive with a blood pressure 88/64, tachycardic with a heart rate of 103.  His vitals  improved upon arriving in the room and resting supine in bed. ?Despite being ill, patient was compliant with his medications including his blood pressure medications and HCTZ. ?Labs are all overall reassuring, no s

## 2021-04-11 NOTE — Discharge Instructions (Signed)
You were seen in the ER today for your lightheadedness.  You were dehydrated.  You had IV fluids and are feeling much improved.  Suspect you were dehydrated from your recent nausea and vomiting as well as your diarrhea.  Please increase your hydration at home for the next few days incorporating drinks with electrolytes in them.  Take care to avoid drinks that are red in color if you are continuing to have diarrhea as it can look alarming if you pass red stool.  Follow-up your primary care doctor and return to the ER with any new severe symptoms. ?

## 2021-04-17 ENCOUNTER — Ambulatory Visit (INDEPENDENT_AMBULATORY_CARE_PROVIDER_SITE_OTHER): Payer: BC Managed Care – PPO | Admitting: Endocrinology

## 2021-04-17 ENCOUNTER — Other Ambulatory Visit: Payer: Self-pay

## 2021-04-17 ENCOUNTER — Encounter: Payer: Self-pay | Admitting: Endocrinology

## 2021-04-17 VITALS — BP 106/74 | HR 91 | Ht 65.0 in | Wt 170.2 lb

## 2021-04-17 DIAGNOSIS — E1165 Type 2 diabetes mellitus with hyperglycemia: Secondary | ICD-10-CM

## 2021-04-17 LAB — POCT GLYCOSYLATED HEMOGLOBIN (HGB A1C): Hemoglobin A1C: 6.7 % — AB (ref 4.0–5.6)

## 2021-04-17 MED ORDER — LANTUS SOLOSTAR 100 UNIT/ML ~~LOC~~ SOPN: 15.0000 [IU] | PEN_INJECTOR | SUBCUTANEOUS | 3 refills | Status: DC

## 2021-04-17 MED ORDER — DAPAGLIFLOZIN PROPANEDIOL 10 MG PO TABS: 10.0000 mg | ORAL_TABLET | Freq: Every day | ORAL | 3 refills | Status: DC

## 2021-04-17 MED ORDER — HYDROCHLOROTHIAZIDE 25 MG PO TABS: 12.5000 mg | ORAL_TABLET | Freq: Every day | ORAL | 4 refills | Status: DC

## 2021-04-17 NOTE — Patient Instructions (Addendum)
Please reduce the Lantus to 15 units each morning, and:  ?I have sent a prescription to your pharmacy, to add [place], and:   ?Please continue the same Ozempic, and:  ?Reduce the hydrochlorothiazide to 1/2 pill per day  ?On this type of insulin schedule, you should eat meals on a regular schedule.  If a meal is missed or significantly delayed, your blood sugar could go low.   ?check your blood sugar twice a day.  vary the time of day when you check, between before the 3 meals, and at bedtime.  also check if you have symptoms of your blood sugar being too high or too low.  please keep a record of the readings and bring it to your next appointment here (or you can bring the meter itself).  You can write it on any piece of paper.  please call us sooner if your blood sugar goes below 70, or if you have a lot of readings over 200.   ?Please come back for a follow-up appointment in May.     ?

## 2021-04-17 NOTE — Progress Notes (Signed)
? ?Subjective:  ? ? Patient ID: [name], male    DOB: Oct 22, 1955, 66 y.o.   MRN: [id number] ? ?HPI ?Pt returns for f/u of diabetes mellitus:  ?DM type: Insulin-requiring type 2 ?Dx'ed: 2008 ?Complications: CAD, CRI, and PN.   ?Therapy: insulin since 2018, and Ozempic.  ?DKA: never ?Severe hypoglycemia: never ?Pancreatitis: never ?Pancreatic imaging: normal on 2002 CT.   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   ?Other: due to noncompliance, he is not a candidate for multiple daily injections.   ?Interval history: pt says cbg varies from 87-134.  He has recovered from [place] last week, during which he skipped 1 done of Ozempic.   ?Past Medical History:  ?Diagnosis Date  ? Asthma   ? CAD (coronary artery disease)   ? a. non obst dz by cath 2004  b. 11/12/15 which showed no obvious large vessel CAD. No PCI or clear reason for NSTEMI ( possibly hypertensive urgency?).   ? CARDIOMYOPATHY   ? a. NICM, LVEF 30% 01/2011 echo; 20-25% 07/2012 echo, EF recovered by echo 06/2015.  ? Congestive heart failure (CHF) ([place])   ? DIABETES MELLITUS, TYPE II   ? HYPERCHOLESTEROLEMIA   ? HYPERTENSION   ? Myocardial infarct ([place]) 10/2015  ? Noncompliance   ? Prostate cancer ([place])   ? prostate cancer  ? PVCs (premature ventricular contractions)   ? a. s/p ablation 01-07-2013 by Dr Rayann Heman  ? ? ?Past Surgical History:  ?Procedure Laterality Date  ? ABLATION  01-07-2013  ? RVOT PVC's ablated by Dr Rayann Heman along anteroseptal RVOT  ? CARDIAC CATHETERIZATION  2004  ? nonobst dz  ? CARDIAC CATHETERIZATION N/A 11/12/2015  ? Procedure: Left Heart Cath and Coronary Angiography;  Surgeon: Sherren Mocha, MD;  Location: [facility];  Service: Cardiovascular;  Laterality: N/A;  ? CHOLECYSTECTOMY N/A 11/16/2013  ? Procedure: LAPAROSCOPIC CHOLECYSTECTOMY WITH INTRAOPERATIVE CHOLANGIOGRAM;  Surgeon: [name], MD;  Location: [facility];  Service: General;  Laterality: N/A;  ? LYMPHADENECTOMY Bilateral 02/22/2014  ? Procedure: PELVIC LYMPH  NODE DISSECTION;  Surgeon: [name], MD;  Location: [facility];  Service: Urology;  Laterality: Bilateral;  ? PROSTATE SURGERY    ? ROBOT ASSISTED LAPAROSCOPIC RADICAL PROSTATECTOMY N/A 02/22/2014  ? Procedure: ROBOTIC ASSISTED LAPAROSCOPIC RADICAL PROSTATECTOMY WITH INDOCYANINE GREEN DYE AND OPEN UMBILICAL HERNIA REPAIR;  Surgeon: [name], MD;  Location: [facility];  Service: Urology;  Laterality: N/A;  ? SUPRAVENTRICULAR TACHYCARDIA ABLATION N/A 09/28/2012  ? Procedure: VTach Ablation;  Surgeon: [name], MD;  Location: [facility] CATH LAB;  Service: Cardiovascular;  Laterality: N/A;  ? SURGERY SCROTAL / TESTICULAR    ? at age 59  ? V-TACH ABLATION N/A 01/07/2013  ? Procedure: V-TACH ABLATION;  Surgeon: [name], MD;  Location: [facility] CATH LAB;  Service: Cardiovascular;  Laterality: N/A;  ? ? ?Social History  ? ?Socioeconomic History  ? Marital status: Single  ?  Spouse name: Not on file  ? Number of children: Not on file  ? Years of education: Not on file  ? Highest education level: Not on file  ?Occupational History  ? 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  Employer: [employer]  ?Tobacco Use  ? Smoking status: Never  ? Smokeless tobacco: Never  ? Tobacco comments:  ?  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 Vaping Use: Never used  ?Substance and Sexual Activity  ? Alcohol use: Yes  ?  Comment:  rare  ? Drug use: No  ? Sexual activity: Never  ?Other Topics Concern  ? Not on file  ?Social History Narrative  ? Single - divorced x 3 -  ? Lives with 2 sons, enjoys spending time with 6 g-kids and 3 kids  ? ?Social Determinants of Health  ? ?Financial Resource Strain: Not on file  ?Food Insecurity: Not on file  ?Transportation Needs: Not on file  ?Physical Activity: Not on file  ?Stress: Not on file  ?Social Connections: Not on file  ?Intimate Partner Violence: Not on file  ? ? ?Current Outpatient Medications on File Prior to Visit  ?Medication Sig Dispense Refill  ? aspirin EC 81 MG EC  tablet Take 1 tablet (81 mg total) by mouth daily.    ? atorvastatin (LIPITOR) 80 MG tablet Take 1 tablet by mouth once daily 90 tablet 0  ? carvedilol (COREG) 12.5 MG tablet TAKE 1 TABLET BY MOUTH TWICE DAILY WITH A MEAL 60 tablet 5  ? clopidogrel (PLAVIX) 75 MG tablet Take 1 tablet (75 mg total) by mouth daily. 90 tablet 1  ? glucose blood (ACCU-CHEK GUIDE) test strip 1 each by Other route 2 (two) times daily. And lancets 2/day 200 each 3  ? isosorbide mononitrate (IMDUR) 60 MG 24 hr tablet TAKE 1 & 1/2 (ONE & ONE-HALF) TABLETS BY MOUTH ONCE DAILY 45 tablet 4  ? lisinopril (ZESTRIL) 20 MG tablet Take 1 tablet (20 mg total) by mouth daily. 30 tablet 6  ? Multiple Vitamin (MULTIVITAMIN WITH MINERALS) TABS tablet Take 1 tablet by mouth daily.    ? Semaglutide, 2 MG/DOSE, (OZEMPIC, 2 MG/DOSE,) 8 MG/3ML SOPN Inject 2 mg into the skin once a week. 9 mL 3  ? ?No current facility-administered medications on file prior to visit.  ? ? ?Allergies  ?Allergen Reactions  ? Ambien [Zolpidem Tartrate] Other (See Comments)  ?  Patient stated this medication made him "go crazy"  ? Metformin And Related Diarrhea  ? ? ?Family History  ?Problem Relation Age of Onset  ? Hypertension Mother   ? Peripheral vascular disease Mother   ? Diabetes Mother   ? Colon cancer Sister   ? Prostate cancer Brother   ? Lung cancer Brother   ? Prostate cancer Brother   ? Prostate cancer Brother   ? Prostate cancer Brother   ? Pancreatic cancer Paternal Aunt   ? Heart failure Maternal Grandmother   ? Heart attack Paternal Grandfather   ? Diabetes Son   ? Peripheral vascular disease Other   ? Hypertension Other   ? Prostate cancer Other   ? Esophageal cancer Neg Hx   ? Rectal cancer Neg Hx   ? Stomach cancer Neg Hx   ? ? ?BP 106/74 (BP Location: Left Arm, Patient Position: Sitting, Cuff Size: Normal)   Pulse 91   Ht '5\' 5"'$  (1.651 m)   Wt 170 lb 3.2 oz (77.2 kg)   SpO2 97%   BMI 28.32 kg/m?  ? ? ?Review of Systems ?He denies hypoglycemia ?    ?Objective:  ? Physical Exam ? ? ? ?A1c=6.7% ? ?Lab Results  ?Component Value Date  ? CREATININE 1.94 (H) 04/11/2021  ? BUN 26 (H) 04/11/2021  ? NA 137 04/11/2021  ? K 3.5 04/11/2021  ? CL 105 04/11/2021  ? CO2 21 (L) 04/11/2021  ? ?   ?Assessment & Plan:  ?Insulin-requiring type 2 DM: overcontrolled.  Goal is to d/c insulin ? ?Patient Instructions  ?Please reduce  the Lantus to 15 units each morning, and:  ?I have sent a prescription to your pharmacy, to add [place], and:   ?Please continue the same Ozempic, and:  ?Reduce the hydrochlorothiazide to 1/2 pill per day  ?On this type of insulin schedule, you should eat meals on a regular schedule.  If a meal is missed or significantly delayed, your blood sugar could go low.   ?check your blood sugar twice a day.  vary the time of day when you check, between before the 3 meals, and at bedtime.  also check if you have symptoms of your blood sugar being too high or too low.  please keep a record of the readings and bring it to your next appointment here (or you can bring the meter itself).  You can write it on any piece of paper.  please call us sooner if your blood sugar goes below 70, or if you have a lot of readings over 200.   ?Please come back for a follow-up appointment in May.     ? ? ?

## 2021-06-17 ENCOUNTER — Ambulatory Visit: Payer: BC Managed Care – PPO | Admitting: Endocrinology

## 2021-07-02 ENCOUNTER — Other Ambulatory Visit: Payer: Self-pay

## 2021-07-02 DIAGNOSIS — E1165 Type 2 diabetes mellitus with hyperglycemia: Secondary | ICD-10-CM

## 2021-07-02 MED ORDER — LANTUS SOLOSTAR 100 UNIT/ML ~~LOC~~ SOPN: 15.0000 [IU] | PEN_INJECTOR | SUBCUTANEOUS | 3 refills | Status: DC

## 2021-07-04 ENCOUNTER — Other Ambulatory Visit: Payer: Self-pay | Admitting: Family

## 2021-07-04 ENCOUNTER — Telehealth: Payer: Self-pay | Admitting: Family

## 2021-07-04 NOTE — Telephone Encounter (Signed)
No answer and vm has not been set up yet.

## 2021-07-04 NOTE — Telephone Encounter (Signed)
Please make sure he is aware that I am in [facility]; will need OV- have not seen since 02/2020; he either needs OV here or can establish with one of the providers at [facility] that is taking new patients. Last refill today;

## 2021-07-04 NOTE — Telephone Encounter (Signed)
Called Pt and made appointment for [name]

## 2021-07-08 ENCOUNTER — Other Ambulatory Visit: Payer: Self-pay | Admitting: Cardiology

## 2021-07-26 ENCOUNTER — Ambulatory Visit (INDEPENDENT_AMBULATORY_CARE_PROVIDER_SITE_OTHER): Payer: BC Managed Care – PPO | Admitting: Family

## 2021-07-26 VITALS — BP 140/82 | HR 92 | Temp 98.0°F | Resp 16 | Ht 65.0 in | Wt 163.0 lb

## 2021-07-26 DIAGNOSIS — G4733 Obstructive sleep apnea (adult) (pediatric): Secondary | ICD-10-CM | POA: Diagnosis not present

## 2021-07-26 DIAGNOSIS — Z23 Encounter for immunization: Secondary | ICD-10-CM

## 2021-07-26 DIAGNOSIS — Z125 Encounter for screening for malignant neoplasm of prostate: Secondary | ICD-10-CM | POA: Diagnosis not present

## 2021-07-26 DIAGNOSIS — I5022 Chronic systolic (congestive) heart failure: Secondary | ICD-10-CM

## 2021-07-26 DIAGNOSIS — E785 Hyperlipidemia, unspecified: Secondary | ICD-10-CM | POA: Diagnosis not present

## 2021-07-26 DIAGNOSIS — E1169 Type 2 diabetes mellitus with other specified complication: Secondary | ICD-10-CM

## 2021-07-26 DIAGNOSIS — Z794 Long term (current) use of insulin: Secondary | ICD-10-CM

## 2021-07-26 DIAGNOSIS — Z0000 Encounter for general adult medical examination without abnormal findings: Secondary | ICD-10-CM | POA: Diagnosis not present

## 2021-07-26 DIAGNOSIS — R634 Abnormal weight loss: Secondary | ICD-10-CM

## 2021-07-26 LAB — CBC WITH DIFFERENTIAL/PLATELET
Basophils Absolute: 0.1 10*3/uL (ref 0.0–0.1)
Basophils Relative: 0.7 % (ref 0.0–3.0)
Eosinophils Absolute: 0.1 10*3/uL (ref 0.0–0.7)
Eosinophils Relative: 1.6 % (ref 0.0–5.0)
HCT: 41.8 % (ref 39.0–52.0)
Hemoglobin: 13.7 g/dL (ref 13.0–17.0)
Lymphocytes Relative: 30 % (ref 12.0–46.0)
Lymphs Abs: 2.5 10*3/uL (ref 0.7–4.0)
MCHC: 32.9 g/dL (ref 30.0–36.0)
MCV: 90.6 fl (ref 78.0–100.0)
Monocytes Absolute: 0.8 10*3/uL (ref 0.1–1.0)
Monocytes Relative: 9.1 % (ref 3.0–12.0)
Neutro Abs: 4.9 10*3/uL (ref 1.4–7.7)
Neutrophils Relative %: 58.6 % (ref 43.0–77.0)
Platelets: 369 10*3/uL (ref 150.0–400.0)
RBC: 4.62 Mil/uL (ref 4.22–5.81)
RDW: 13 % (ref 11.5–15.5)
WBC: 8.4 10*3/uL (ref 4.0–10.5)

## 2021-07-26 LAB — COMPREHENSIVE METABOLIC PANEL
ALT: 26 U/L (ref 0–53)
AST: 25 U/L (ref 0–37)
Albumin: 4 g/dL (ref 3.5–5.2)
Alkaline Phosphatase: 105 U/L (ref 39–117)
BUN: 15 mg/dL (ref 6–23)
CO2: 30 mEq/L (ref 19–32)
Calcium: 9.7 mg/dL (ref 8.4–10.5)
Chloride: 106 mEq/L (ref 96–112)
Creatinine, Ser: 1.55 mg/dL — ABNORMAL HIGH (ref 0.40–1.50)
GFR: 46.12 mL/min — ABNORMAL LOW (ref >60.00)
Glucose, Bld: 112 mg/dL — ABNORMAL HIGH (ref 70–99)
Potassium: 3.5 mEq/L (ref 3.5–5.1)
Sodium: 143 mEq/L (ref 135–145)
Total Bilirubin: 0.4 mg/dL (ref 0.2–1.2)
Total Protein: 7.5 g/dL (ref 6.0–8.3)

## 2021-07-26 LAB — LIPID PANEL
Cholesterol: 93 mg/dL (ref 0–200)
HDL: 31.4 mg/dL — ABNORMAL LOW (ref >39.00)
LDL Cholesterol: 46 mg/dL (ref 0–99)
NonHDL: 61.44
Total CHOL/HDL Ratio: 3
Triglycerides: 76 mg/dL (ref 0.0–149.0)
VLDL: 15.2 mg/dL (ref 0.0–40.0)

## 2021-07-26 LAB — PSA: PSA: 0.07 ng/mL — ABNORMAL LOW (ref 0.10–4.00)

## 2021-07-26 MED ORDER — CARVEDILOL 12.5 MG PO TABS: 12.5000 mg | ORAL_TABLET | Freq: Two times a day (BID) | ORAL | 5 refills | Status: DC

## 2021-07-26 MED ORDER — ATORVASTATIN CALCIUM 80 MG PO TABS: 80.0000 mg | ORAL_TABLET | Freq: Every day | ORAL | 3 refills | Status: DC

## 2021-07-26 MED ORDER — ISOSORBIDE MONONITRATE ER 60 MG PO TB24
ORAL_TABLET | ORAL | 0 refills | Status: DC
Start: 2021-07-26 — End: 2021-08-05

## 2021-07-26 NOTE — Progress Notes (Signed)
[name] is a 66 y.o. male with the following history as recorded in EpicCare:  Patient Active Problem List   Diagnosis Date Noted   Syncope, vasovagal 04/21/2020   Syncope 04/21/2020   COVID-19 virus infection 02/07/2019   Diarrhea 02/07/2019   Acute on chronic renal failure ([place]) 02/06/2019   Routine general medical examination at a health care facility 03/12/2018   Balanitis 10/15/2016   NSTEMI (non-ST elevated myocardial infarction) ([place]) 11/13/2015   PVCs (premature ventricular contractions)    CAD (coronary artery disease)    Acute kidney injury ([place]) 07/19/2015   AKI (acute kidney injury) ([place]) 07/19/2015   Hyperglycemia 07/12/2015   Prostate cancer ([place]) 02/22/2014   Cholelithiasis and cholecystitis without obstruction 11/16/2013   PVC's (premature ventricular contractions) 01/07/2013   CKD (chronic kidney disease) stage 2, GFR 60-89 ml/min 08/25/2012   OSA (obstructive sleep apnea) 62/69/4854   Chronic systolic heart failure ([place]) 08/06/2012   HTN (hypertension), malignant 02/12/2011   Non compliance w medication regimen 02/12/2011   HYPERCHOLESTEROLEMIA 09/17/2009   PREMATURE VENTRICULAR CONTRACTIONS 09/17/2009   PSA, INCREASED 04/26/2009   Type 2 diabetes mellitus ([place]) 04/25/2009   CARDIOMYOPATHY 04/25/2009    Current Outpatient Medications  Medication Sig Dispense Refill   aspirin EC 81 MG EC tablet Take 1 tablet (81 mg total) by mouth daily.     clopidogrel (PLAVIX) 75 MG tablet Take 1 tablet (75 mg total) by mouth daily. 90 tablet 1   dapagliflozin propanediol (FARXIGA) 10 MG TABS tablet Take 1 tablet (10 mg total) by mouth daily before breakfast. 90 tablet 3   glucose blood (ACCU-CHEK GUIDE) test strip 1 each by Other route 2 (two) times daily. And lancets 2/day 200 each 3   hydrochlorothiazide (HYDRODIURIL) 25 MG tablet Take 0.5 tablets (12.5 mg total) by mouth daily. 30 tablet 4   insulin glargine (LANTUS SOLOSTAR) 100 UNIT/ML Solostar Pen Inject 15  Units into the skin every morning. And pen needles 1/day 15 mL 3   lisinopril (ZESTRIL) 20 MG tablet Take 1 tablet (20 mg total) by mouth daily. 30 tablet 6   Multiple Vitamin (MULTIVITAMIN WITH MINERALS) TABS tablet Take 1 tablet by mouth daily.     Semaglutide, 2 MG/DOSE, (OZEMPIC, 2 MG/DOSE,) 8 MG/3ML SOPN Inject 2 mg into the skin once a week. 9 mL 3   atorvastatin (LIPITOR) 80 MG tablet Take 1 tablet (80 mg total) by mouth daily. 90 tablet 3   carvedilol (COREG) 12.5 MG tablet Take 1 tablet (12.5 mg total) by mouth 2 (two) times daily with a meal. 60 tablet 5   isosorbide mononitrate (IMDUR) 60 MG 24 hr tablet TAKE 1 & 1/2 (ONE & ONE-HALF) TABLETS BY MOUTH ONCE DAILY 45 tablet 0   No current facility-administered medications for this visit.    Allergies: Ambien [zolpidem tartrate] and Metformin and related  Past Medical History:  Diagnosis Date   Asthma    CAD (coronary artery disease)    a. non obst dz by cath 2004  b. 11/12/15 which showed no obvious large vessel CAD. No PCI or clear reason for NSTEMI ( possibly hypertensive urgency?).    CARDIOMYOPATHY    a. NICM, LVEF 30% 01/2011 echo; 20-25% 07/2012 echo, EF recovered by echo 06/2015.   Congestive heart failure (CHF) ([place])    DIABETES MELLITUS, TYPE II    HYPERCHOLESTEROLEMIA    HYPERTENSION    Myocardial infarct ([place]) 10/2015   Noncompliance    Prostate cancer ([place])    prostate  cancer   PVCs (premature ventricular contractions)    a. s/p ablation 01-07-2013 by Dr Rayann Heman    Past Surgical History:  Procedure Laterality Date   ABLATION  01-07-2013   RVOT PVC's ablated by Dr Rayann Heman along anteroseptal RVOT   CARDIAC CATHETERIZATION  2004   nonobst dz   CARDIAC CATHETERIZATION N/A 11/12/2015   Procedure: Left Heart Cath and Coronary Angiography;  Surgeon: Sherren Mocha, MD;  Location: [facility];  Service: Cardiovascular;  Laterality: N/A;   CHOLECYSTECTOMY N/A 11/16/2013   Procedure: LAPAROSCOPIC CHOLECYSTECTOMY WITH  INTRAOPERATIVE CHOLANGIOGRAM;  Surgeon: [name], MD;  Location: [facility];  Service: General;  Laterality: N/A;   LYMPHADENECTOMY Bilateral 02/22/2014   Procedure: PELVIC LYMPH NODE DISSECTION;  Surgeon: [name], MD;  Location: [facility];  Service: Urology;  Laterality: Bilateral;   PROSTATE SURGERY     ROBOT ASSISTED LAPAROSCOPIC RADICAL PROSTATECTOMY N/A 02/22/2014   Procedure: ROBOTIC ASSISTED LAPAROSCOPIC RADICAL PROSTATECTOMY WITH INDOCYANINE GREEN DYE AND OPEN UMBILICAL HERNIA REPAIR;  Surgeon: [name], MD;  Location: [facility];  Service: Urology;  Laterality: N/A;   SUPRAVENTRICULAR TACHYCARDIA ABLATION N/A 09/28/2012   Procedure: Tanna Furry Ablation;  Surgeon: [name], MD;  Location: [facility] CATH [facility];  Service: Cardiovascular;  Laterality: N/A;   SURGERY SCROTAL / TESTICULAR     at age 89   V-TACH ABLATION N/A 01/07/2013   Procedure: V-TACH ABLATION;  Surgeon: [name], MD;  Location: [facility] CATH LAB;  Service: Cardiovascular;  Laterality: N/A;    Family History  Problem Relation Age of Onset   Hypertension Mother    Peripheral vascular disease Mother    Diabetes Mother    Colon cancer Sister    Prostate cancer Brother    Lung cancer Brother    Prostate cancer Brother    Prostate cancer Brother    Prostate cancer Brother    Pancreatic cancer Paternal Aunt    Heart failure Maternal Grandmother    Heart attack Paternal Grandfather    Diabetes Son    Peripheral vascular disease Other    Hypertension Other    Prostate cancer Other    Esophageal cancer Neg Hx    Rectal cancer Neg Hx    Stomach cancer Neg Hx     Social History   Tobacco Use   Smoking status: Never   Smokeless tobacco: Never   Tobacco comments:    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  HENT: Negative.    Eyes: Negative.   Respiratory: Negative.    Cardiovascular: Negative.   Gastrointestinal: Negative.   Genitourinary: Negative.   Musculoskeletal: Negative.   Skin: Negative.   Neurological: Negative.   Endo/Heme/Allergies: Negative.   Psychiatric/Behavioral: Negative.      Objective:  Vitals:   07/26/21 0932  BP: 140/82  Pulse: 92  Resp: 16  Temp: 98 F (36.7 C)  TempSrc: Oral  SpO2: 97%  Weight: 163 lb (73.9 kg)  Height: '5\' 5"'  (1.651 m)    General: Well developed, well nourished, in no acute distress  Skin : Warm and dry.  Head: Normocephalic and atraumatic  Eyes: Sclera and conjunctiva clear; pupils round and  reactive to light; extraocular movements intact  Ears: External normal; canals clear; tympanic membranes normal  Oropharynx: Pink, supple. No suspicious lesions  Neck: Supple without thyromegaly, adenopathy  Lungs: Respirations unlabored; clear to auscultation bilaterally without wheeze, rales, rhonchi  CVS exam: normal rate and regular rhythm.  Abdomen: Soft; nontender; nondistended; normoactive bowel sounds; no masses or hepatosplenomegaly  Musculoskeletal: No deformities; no active joint inflammation  Extremities: No edema, cyanosis, clubbing  Vessels: Symmetric bilaterally  Neurologic: Alert and oriented; speech intact; face symmetrical; moves all extremities well; CNII-XII intact without focal deficit  Assessment:  1. PE (physical exam), annual   2. Hyperlipidemia, unspecified hyperlipidemia type   3. Prostate cancer screening   4. OSA (obstructive sleep apnea)   5. Chronic systolic heart failure ([place])   6. Need for pneumococcal 20-valent conjugate vaccination   7. Weight loss   8. Type 2 diabetes mellitus with other specified complication, with long-term current use of insulin (HCC)     Plan:  Age appropriate preventive healthcare needs addressed;  encouraged regular eye doctor and dental exams; encouraged regular exercise; will update labs and refills as needed today; follow-up to be determined;  Patient is overdue to see his cardiologist- referral placed; short term refills updated;  Referral to neurology for discussion regarding management of sleep apnea;  Prevnar 20 given;  Suspect weight loss is due due to Ozempic- he will plan to see endocrine in August and if weight loss continuing to discuss with endocrine; he will also drink more water to help with the constipation that has started with use of Ozempic; if side effects remain problematic, discuss with endocrine so changes can be made.   No follow-ups on file.  Orders Placed This Encounter  Procedures   Pneumococcal conjugate vaccine 20-valent (Prevnar 20)   Comp Met (CMET)   Lipid panel   PSA   CBC with Differential/Platelet   Ambulatory referral to Neurology    Referral Priority:   Routine    Referral Type:   Consultation    Referral Reason:   Specialty Services Required    Requested Specialty:   Neurology    Number of Visits Requested:   1   Ambulatory referral to Cardiology    Referral Priority:   Routine    Referral Type:   Consultation    Referral Reason:   Specialty Services Required    Referred to Provider:   Jerline Pain, MD    Requested Specialty:   Cardiology    Number of Visits Requested:   1    Requested Prescriptions   Signed Prescriptions Disp Refills   atorvastatin (LIPITOR) 80 MG tablet 90 tablet 3    Sig: Take 1 tablet (80 mg total) by mouth daily.   isosorbide mononitrate (IMDUR) 60 MG 24 hr tablet 45 tablet 0    Sig: TAKE 1 & 1/2 (ONE & ONE-HALF) TABLETS BY MOUTH ONCE DAILY   carvedilol (COREG) 12.5 MG tablet 60 tablet 5    Sig: Take 1 tablet (12.5 mg total) by mouth 2 (two) times daily with a meal.

## 2021-07-29 ENCOUNTER — Other Ambulatory Visit: Payer: Self-pay | Admitting: Cardiology

## 2021-08-04 ENCOUNTER — Other Ambulatory Visit: Payer: Self-pay | Admitting: Cardiology

## 2021-08-04 NOTE — Progress Notes (Unsigned)
Office Visit    Patient Name: [name] Date of Encounter: 08/04/2021  PCP:  Marrian Salvage, [facility]  Cardiologist:  Candee Furbish, MD  Advanced Practice Provider:  No care team member to display Electrophysiologist:  None    Chief Complaint    [name] is a 67 y.o. male with a hx of CAD (cardiac catheterization in 2004 showed nonobstructive disease and then cardiac cath in 10/17 showed no obvious large vessel CAD), nonischemic cardiomyopathy (LVEF 30% 01/2011, echocardiogram 20 to 25% LVEF 7/14, EF recovered by echo 6/17), diabetes mellitus type 2, hypertension, hyperlipidemia, and PVCs presents today for annual follow-up.  He was last seen 06/26/2020 by Dr. Marlou Porch.  At this time he was seen for cardiac clearance for colonoscopy.  He was on Plavix at the time and tolerating well.  He had recently been having some shortness of breath.  He was feeling more tired than usual specifically after showering, and walking up flights of steps.  He denied any chest pain, tightness, or pressure.  A pharmacologic stress test was ordered for further risk stratification.  Recent echocardiogram was performed 04/21/2020 which showed normal function.  No ischemia was noted on stress test.  He was cleared for his colonoscopy.  He was seen in the ED 3/23 for lightheadedness and feeling short of breath and fatigue with exertion.  Suspected etiology was hypovolemia which was corrected intravenously.   Today, he *** Past Medical History    Past Medical History:  Diagnosis Date   Asthma    CAD (coronary artery disease)    a. non obst dz by cath 2004  b. 11/12/15 which showed no obvious large vessel CAD. No PCI or clear reason for NSTEMI ( possibly hypertensive urgency?).    CARDIOMYOPATHY    a. NICM, LVEF 30% 01/2011 echo; 20-25% 07/2012 echo, EF recovered by echo 06/2015.   Congestive heart failure (CHF) ([place])    DIABETES MELLITUS, TYPE II     HYPERCHOLESTEROLEMIA    HYPERTENSION    Myocardial infarct ([place]) 10/2015   Noncompliance    Prostate cancer ([place])    prostate cancer   PVCs (premature ventricular contractions)    a. s/p ablation 01-07-2013 by Dr Rayann Heman   Past Surgical History:  Procedure Laterality Date   ABLATION  01-07-2013   RVOT PVC's ablated by Dr Rayann Heman along anteroseptal RVOT   CARDIAC CATHETERIZATION  2004   nonobst dz   CARDIAC CATHETERIZATION N/A 11/12/2015   Procedure: Left Heart Cath and Coronary Angiography;  Surgeon: Sherren Mocha, MD;  Location: [facility];  Service: Cardiovascular;  Laterality: N/A;   CHOLECYSTECTOMY N/A 11/16/2013   Procedure: LAPAROSCOPIC CHOLECYSTECTOMY WITH INTRAOPERATIVE CHOLANGIOGRAM;  Surgeon: [name], MD;  [facility];  Service: General;  Laterality: N/A;   LYMPHADENECTOMY Bilateral 02/22/2014   Procedure: PELVIC LYMPH NODE DISSECTION;  Surgeon: [name], MD;  [facility];  Service: Urology;  Laterality: Bilateral;   PROSTATE SURGERY     ROBOT ASSISTED LAPAROSCOPIC RADICAL PROSTATECTOMY N/A 02/22/2014   Procedure: ROBOTIC ASSISTED LAPAROSCOPIC RADICAL PROSTATECTOMY WITH INDOCYANINE GREEN DYE AND OPEN UMBILICAL HERNIA REPAIR;  Surgeon: [name], MD;  [facility];  Service: Urology;  Laterality: N/A;   SUPRAVENTRICULAR TACHYCARDIA ABLATION N/A 09/28/2012   Procedure: Tanna Furry Ablation;  Surgeon: [name], MD;  [facility] CATH LAB;  Service: Cardiovascular;  Laterality: N/A;   SURGERY SCROTAL / TESTICULAR     at age 46  V-TACH ABLATION N/A 01/07/2013   Procedure: V-TACH ABLATION;  Surgeon: [name], MD;  Location: [facility]ATH [facility];  Service: Cardiovascular;  Laterality: N/A;    Allergies  Allergies  Allergen Reactions   Ambien [Zolpidem Tartrate] Other (See Comments)    Patient stated this medication made him "go crazy"   Metformin And Related Diarrhea    EKGs/Labs/Other Studies Reviewed:   The following studies were  reviewed today:  Lexiscan Myoview 07/04/2020  The left ventricular ejection fraction is moderately decreased (30-44%). Nuclear stress EF: 42%. There was no ST segment deviation noted during stress. No T wave inversion was noted during stress. There is normal myocardial perfusion without evidence of ischemia or infarct. This is an intermediate risk study due to moderately reduced LVEF. Results are similar to prior stress test in 2020.   Gwyndolyn Kaufman, MD   EKG:  EKG is *** ordered today.  The ekg ordered today demonstrates ***  Recent Labs: 09/21/2020: Magnesium 2.1 04/11/2021: B Natriuretic Peptide 9.3 07/26/2021: ALT 26; BUN 15; Creatinine, Ser 1.55; Hemoglobin 13.7; Platelets 369.0; Potassium 3.5; Sodium 143  Recent Lipid Panel    Component Value Date/Time   CHOL 93 07/26/2021 1033   CHOL 86 (L) 10/11/2018 0955   TRIG 76.0 07/26/2021 1033   TRIG 200 09/20/2007 0000   HDL 31.40 (L) 07/26/2021 1033   HDL 30 (L) 10/11/2018 0955   CHOLHDL 3 07/26/2021 1033   VLDL 15.2 07/26/2021 1033   LDLCALC 46 07/26/2021 1033   LDLCALC 39 10/11/2018 0955   LDLDIRECT 148.8 04/25/2009 0000    Risk Assessment/Calculations:  {Does this patient have ATRIAL FIBRILLATION?:[phone]}  Home Medications   No outpatient medications have been marked as taking for the 08/05/21 encounter (Appointment) with Elgie Collard, PA-C.     Review of Systems   ***   All other systems reviewed and are otherwise negative except as noted above.  Physical Exam    VS:  There were no vitals taken for this visit. , BMI There is no height or weight on file to calculate BMI.  Wt Readings from Last 3 Encounters:  07/26/21 163 lb (73.9 kg)  04/17/21 170 lb 3.2 oz (77.2 kg)  04/11/21 180 lb (81.6 kg)     GEN: Well nourished, well developed, in no acute distress. HEENT: normal. Neck: Supple, no JVD, carotid bruits, or masses. Cardiac: ***RRR, no murmurs, rubs, or gallops. No clubbing, cyanosis, edema.   ***Radials/PT 2+ and equal bilaterally.  Respiratory:  ***Respirations regular and unlabored, clear to auscultation bilaterally. GI: Soft, nontender, nondistended. MS: No deformity or atrophy. Skin: Warm and dry, no rash. Neuro:  Strength and sensation are intact. Psych: Normal affect.  Assessment & Plan    Vasovagal syncope  Prior nonischemic cardiomyopathy  Nonobstructive CAD  Hypertension  Hyperlipidemia  Diabetes with CKD     Disposition: Follow up {follow up:15908} with Candee Furbish, MD or APP.  Signed, Elgie Collard, PA-C 08/04/2021, 12:22 PM [facility]

## 2021-08-05 ENCOUNTER — Ambulatory Visit (INDEPENDENT_AMBULATORY_CARE_PROVIDER_SITE_OTHER): Payer: BC Managed Care – PPO | Admitting: Physician Assistant

## 2021-08-05 ENCOUNTER — Encounter: Payer: Self-pay | Admitting: Physician Assistant

## 2021-08-05 VITALS — BP 124/92 | HR 96 | Ht 65.0 in | Wt 161.0 lb

## 2021-08-05 DIAGNOSIS — R0602 Shortness of breath: Secondary | ICD-10-CM | POA: Diagnosis not present

## 2021-08-05 DIAGNOSIS — E1169 Type 2 diabetes mellitus with other specified complication: Secondary | ICD-10-CM

## 2021-08-05 DIAGNOSIS — R55 Syncope and collapse: Secondary | ICD-10-CM | POA: Diagnosis not present

## 2021-08-05 DIAGNOSIS — I10 Essential (primary) hypertension: Secondary | ICD-10-CM

## 2021-08-05 DIAGNOSIS — Z794 Long term (current) use of insulin: Secondary | ICD-10-CM

## 2021-08-05 DIAGNOSIS — E785 Hyperlipidemia, unspecified: Secondary | ICD-10-CM

## 2021-08-05 DIAGNOSIS — R5383 Other fatigue: Secondary | ICD-10-CM

## 2021-08-05 DIAGNOSIS — I428 Other cardiomyopathies: Secondary | ICD-10-CM

## 2021-08-05 MED ORDER — ISOSORBIDE MONONITRATE ER 60 MG PO TB24: ORAL_TABLET | ORAL | 11 refills | Status: DC

## 2021-08-05 MED ORDER — LISINOPRIL 20 MG PO TABS: 20.0000 mg | ORAL_TABLET | Freq: Every day | ORAL | 11 refills | Status: DC

## 2021-08-05 MED ORDER — CLOPIDOGREL BISULFATE 75 MG PO TABS: 75.0000 mg | ORAL_TABLET | Freq: Every day | ORAL | 11 refills | Status: DC

## 2021-08-05 MED ORDER — HYDROCHLOROTHIAZIDE 25 MG PO TABS: 12.5000 mg | ORAL_TABLET | Freq: Every day | ORAL | 11 refills | Status: DC

## 2021-08-05 NOTE — Patient Instructions (Addendum)
Medication Instructions:  Your physician recommends that you continue on your current medications as directed. Please refer to the Current Medication list given to you today.  *If you need a refill on your cardiac medications before your next appointment, please call your pharmacy*   Lab Work: CBC, BMP, TSH and Mag today If you have labs (blood work) drawn today and your tests are completely normal, you will receive your results only by: [place] (if you have MyChart) OR A paper copy in the mail If you have any lab test that is abnormal or we need to change your treatment, we will call you to review the results.   Testing/Procedures: Your physician has requested that you have an echocardiogram. Echocardiography is a painless test that uses sound waves to create images of your heart. It provides your doctor with information about the size and shape of your heart and how well your heart's chambers and valves are working. This procedure takes approximately one hour. There are no restrictions for this procedure.    Follow-Up: At [facility], you and your health needs are our priority.  As part of our continuing mission to provide you with exceptional heart care, we have created designated Provider Care Teams.  These Care Teams include your primary Cardiologist (physician) and Advanced Practice Providers (APPs -  Physician Assistants and Nurse Practitioners) who all work together to provide you with the care you need, when you need it.  We recommend signing up for the patient portal called "MyChart".  Sign up information is provided on this After Visit Summary.  MyChart is used to connect with patients for Virtual Visits (Telemedicine).  Patients are able to view lab/test results, encounter notes, upcoming appointments, etc.  Non-urgent messages can be sent to your provider as well.   To learn more about what you can do with MyChart, go to [email or web address].    Your next  appointment:   6 month(s)  The format for your next appointment:   In Person  Provider:   Candee Furbish, MD or Nicholes Rough PA-C  Important Information About [place]

## 2021-08-06 LAB — BASIC METABOLIC PANEL
BUN/Creatinine Ratio: 11 (ref 10–24)
BUN: 17 mg/dL (ref 8–27)
CO2: 22 mmol/L (ref 20–29)
Calcium: 9.7 mg/dL (ref 8.6–10.2)
Chloride: 107 mmol/L — ABNORMAL HIGH (ref 96–106)
Creatinine, Ser: 1.52 mg/dL — ABNORMAL HIGH (ref 0.76–1.27)
Glucose: 110 mg/dL — ABNORMAL HIGH (ref 70–99)
Potassium: 3.8 mmol/L (ref 3.5–5.2)
Sodium: 142 mmol/L (ref 134–144)
eGFR: 50 mL/min/{1.73_m2} — ABNORMAL LOW (ref >59)

## 2021-08-06 LAB — CBC
Hematocrit: 40.7 % (ref 37.5–51.0)
Hemoglobin: 13.5 g/dL (ref 13.0–17.7)
MCH: 29.7 pg (ref 26.6–33.0)
MCHC: 33.2 g/dL (ref 31.5–35.7)
MCV: 90 fL (ref 79–97)
Platelets: 373 10*3/uL (ref 150–450)
RBC: 4.55 x10E6/uL (ref 4.14–5.80)
RDW: 12.4 % (ref 11.6–15.4)
WBC: 7.7 10*3/uL (ref 3.4–10.8)

## 2021-08-06 LAB — MAGNESIUM: Magnesium: 2.2 mg/dL (ref 1.6–2.3)

## 2021-08-06 LAB — TSH: TSH: 1.02 u[IU]/mL (ref 0.450–4.500)

## 2021-08-16 ENCOUNTER — Telehealth: Payer: Self-pay

## 2021-08-16 NOTE — Telephone Encounter (Signed)
Patient Advocate Encounter   Received notification from [employer] that prior authorization RENEWAL is required for Ozempic (2 MG/DOSE) '8MG'$ /3ML pen-injectors  Key [id number] Status is [place], CPhT Pharmacy Patient Advocate Specialist [place] Patient Advocate Team Phone: [phone]   Fax: [phone]

## 2021-08-20 ENCOUNTER — Ambulatory Visit (HOSPITAL_COMMUNITY): Payer: BC Managed Care – PPO | Attending: Internal Medicine

## 2021-08-20 DIAGNOSIS — R5383 Other fatigue: Secondary | ICD-10-CM

## 2021-08-20 DIAGNOSIS — I428 Other cardiomyopathies: Secondary | ICD-10-CM

## 2021-08-20 LAB — ECHOCARDIOGRAM COMPLETE
Area-P 1/2: 3.53 cm2
S' Lateral: 3 cm

## 2021-08-21 ENCOUNTER — Other Ambulatory Visit: Payer: Self-pay

## 2021-08-21 DIAGNOSIS — E1165 Type 2 diabetes mellitus with hyperglycemia: Secondary | ICD-10-CM

## 2021-08-21 MED ORDER — OZEMPIC (2 MG/DOSE) 8 MG/3ML ~~LOC~~ SOPN: 2.0000 mg | PEN_INJECTOR | SUBCUTANEOUS | 3 refills | Status: DC

## 2021-08-27 ENCOUNTER — Other Ambulatory Visit: Payer: Self-pay | Admitting: Cardiology

## 2021-09-06 ENCOUNTER — Other Ambulatory Visit (HOSPITAL_COMMUNITY): Payer: Self-pay

## 2021-09-06 ENCOUNTER — Telehealth: Payer: Self-pay

## 2021-09-06 NOTE — Telephone Encounter (Signed)
Patient Advocate Encounter   Received notification from [employer] that prior authorization is required for Ozempic '2mg'$ /dose. This medication was previously approved via CoverMyMeds, however insurance has changed processors to [employer]. New PA request is being submitted with [employer].  (EOC) ID:  [id number]  Clista Bernhardt, CPhT Rx Patient Advocate Specialist Phone: [phone]

## 2021-09-06 NOTE — Telephone Encounter (Signed)
Received phone call from [employer] that they were needing a PA to be done for the patient's ozempic. Informed [employer] that PA was done and approved. Pharmacy states insurance is saying they never received PA from [place]. I called Colletta [place]- PA team to get more information. She stated that she is now getting the same message as the pharmacy. She will work on it and update [place] with insurance decision. [facility] pharmacy stated that she will notify patient and let him know.

## 2021-09-06 NOTE — Telephone Encounter (Signed)
  Please advise on which criteria are met, and which lab results/ chart notes to use for submission of this prior auth request.

## 2021-09-09 NOTE — Telephone Encounter (Signed)
Processor for this insurance has changed to a new program.  Created new encounter for [employer], added copy of new PA form to patient chart.  Clista Bernhardt, CPhT Rx Patient Advocate Specialist Phone: [phone]

## 2021-09-10 NOTE — Telephone Encounter (Signed)
Patient Advocate Encounter   Received notification from [employer] that questionnaire still needs to be completed for Ozempic PA. Added a copy of printable form to patient chart, requesting assistance/update from Endo Team as PA Team is unable to complete request without confirming additional information.  PromptPA ID [id number]  Clista Bernhardt, CPhT Rx Patient Advocate Specialist Phone: [phone]

## 2021-09-12 ENCOUNTER — Encounter: Payer: Self-pay | Admitting: Neurology

## 2021-09-12 ENCOUNTER — Ambulatory Visit (INDEPENDENT_AMBULATORY_CARE_PROVIDER_SITE_OTHER): Payer: BC Managed Care – PPO | Admitting: Neurology

## 2021-09-12 ENCOUNTER — Other Ambulatory Visit: Payer: Self-pay | Admitting: Endocrinology

## 2021-09-12 VITALS — BP 150/93 | HR 68 | Ht 65.0 in | Wt 163.4 lb

## 2021-09-12 DIAGNOSIS — I4729 Other ventricular tachycardia: Secondary | ICD-10-CM | POA: Diagnosis not present

## 2021-09-12 DIAGNOSIS — G4733 Obstructive sleep apnea (adult) (pediatric): Secondary | ICD-10-CM | POA: Diagnosis not present

## 2021-09-12 DIAGNOSIS — E1165 Type 2 diabetes mellitus with hyperglycemia: Secondary | ICD-10-CM

## 2021-09-12 DIAGNOSIS — I214 Non-ST elevation (NSTEMI) myocardial infarction: Secondary | ICD-10-CM

## 2021-09-12 DIAGNOSIS — R351 Nocturia: Secondary | ICD-10-CM

## 2021-09-12 DIAGNOSIS — E663 Overweight: Secondary | ICD-10-CM

## 2021-09-12 DIAGNOSIS — G4719 Other hypersomnia: Secondary | ICD-10-CM

## 2021-09-12 DIAGNOSIS — I428 Other cardiomyopathies: Secondary | ICD-10-CM

## 2021-09-12 NOTE — Patient Instructions (Signed)

## 2021-09-12 NOTE — Progress Notes (Signed)
Subjective:    Patient ID: [name] is a 66 y.o. male.  HPI    Star Age, MD, PhD [facility] [address] [place], [place] [address]  Dear [name],  I saw your patient, [name], upon your kind request in my sleep clinic today for initial consultation of his sleep disorder, in particular, evaluation of his prior diagnosis of obstructive sleep apnea.  The patient is unaccompanied today.  As you know, Mr. [name] is a 66 year old right-handed gentleman with an underlying complex medical history of coronary artery disease with status post MI, cardiomyopathy, history of V. tach with status post ablation, congestive heart failure, hypertension, hyperlipidemia, diabetes, prostate cancer with status post radical prostatectomy, asthma, history of PVCs, and overweight state, who was previously diagnosed with obstructive sleep apnea.  I reviewed your office note from 07/26/2021.  I was able to review his sleep study report from study dated 11/28/2012.  Of note, his weight was 183 pounds at the time, current weight 163 lb.  His total AHI was 13/h.  O2 nadir was 87%.  He has not been on CPAP or AutoPap therapy.  His Epworth sleepiness score is 11 out of 24, fatigue severity score is 38 out of 63.  He does report not sleeping through the night very well, he has nocturia about 2-3 times per average night.  He works full-time as a [employer].  He works in a group home, usually works 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 but often picks up extra shifts.  Bedtime is generally around 10 PM and rise time around 6 AM.  He is a non-smoker and drinks alcohol rarely, caffeine in the form of coffee, about 2 to 3 cups/day and 1 or 2 sodas per day.  He had 3 grown children altogether, daughter is the oldest and then he has a son, youngest son passed away about 11 years ago.  He has 7 grandchildren.  He lives with his sister and nephew.  He is divorced twice.  He has lost  about 20 pounds over the past several years.  He denies recurrent nocturnal or morning headaches.  He is not aware of any family history of sleep apnea.  His Past Medical History Is Significant For: Past Medical History:  Diagnosis Date   Asthma    CAD (coronary artery disease)    a. non obst dz by cath 2004  b. 11/12/15 which showed no obvious large vessel CAD. No PCI or clear reason for NSTEMI ( possibly hypertensive urgency?).    CARDIOMYOPATHY    a. NICM, LVEF 30% 01/2011 echo; 20-25% 07/2012 echo, EF recovered by echo 06/2015.   Congestive heart failure (CHF) ([place])    DIABETES MELLITUS, TYPE II    HYPERCHOLESTEROLEMIA    HYPERTENSION    Myocardial infarct ([place]) 10/2015   Noncompliance    Prostate cancer ([place])    prostate cancer   PVCs (premature ventricular contractions)    a. s/p ablation 01-07-2013 by Dr Rayann Heman    His Past Surgical History Is Significant For: Past Surgical History:  Procedure Laterality Date   ABLATION  01-07-2013   RVOT PVC's ablated by Dr Rayann Heman along anteroseptal RVOT   CARDIAC CATHETERIZATION  2004   nonobst dz   CARDIAC CATHETERIZATION N/A 11/12/2015   Procedure: Left Heart Cath and Coronary Angiography;  Surgeon: Sherren Mocha, MD;  Location: [facility];  Service: Cardiovascular;  Laterality: N/A;   CHOLECYSTECTOMY N/A 11/16/2013  Procedure: LAPAROSCOPIC CHOLECYSTECTOMY WITH INTRAOPERATIVE CHOLANGIOGRAM;  Surgeon: [name], MD;  Location: [facility];  Service: General;  Laterality: N/A;   LYMPHADENECTOMY Bilateral 02/22/2014   Procedure: PELVIC LYMPH NODE DISSECTION;  Surgeon: [name], MD;  Location: [facility];  Service: Urology;  Laterality: Bilateral;   PROSTATE SURGERY     ROBOT ASSISTED LAPAROSCOPIC RADICAL PROSTATECTOMY N/A 02/22/2014   Procedure: ROBOTIC ASSISTED LAPAROSCOPIC RADICAL PROSTATECTOMY WITH INDOCYANINE GREEN DYE AND OPEN UMBILICAL HERNIA REPAIR;  Surgeon: [name], MD;  Location: [facility];  Service: Urology;   Laterality: N/A;   SUPRAVENTRICULAR TACHYCARDIA ABLATION N/A 09/28/2012   Procedure: Tanna Furry Ablation;  Surgeon: [name], MD;  Location: [facility];  Service: Cardiovascular;  Laterality: N/A;   SURGERY SCROTAL / TESTICULAR     at age 22   V-TACH ABLATION N/A 01/07/2013   Procedure: V-TACH ABLATION;  Surgeon: [name], MD;  Location: [facility];  Service: Cardiovascular;  Laterality: N/A;    His Family History Is Significant For: Family History  Problem Relation Age of Onset   Hypertension Mother    Peripheral vascular disease Mother    Diabetes Mother    Colon cancer Sister    Prostate cancer Brother    Lung cancer Brother    Prostate cancer Brother    Prostate cancer Brother    Prostate cancer Brother    Pancreatic cancer Paternal Aunt    Heart failure Maternal Grandmother    Heart attack Paternal Grandfather    Diabetes Son    Peripheral vascular disease Other    Hypertension Other    Prostate cancer Other    Esophageal cancer Neg Hx    Rectal cancer Neg Hx    Stomach cancer Neg Hx    Sleep apnea Neg Hx     His Social History Is Significant For: Social History   Socioeconomic History   Marital status: Single    Spouse name: Not on file   Number of children: Not on file   Years of education: Not on file   Highest education level: Not on file  Occupational History   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    Employer: [employer]  Tobacco Use   Smoking status: Never   Smokeless tobacco: Never   Tobacco comments:    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  Scientific laboratory technician Use: Never used  Substance and Sexual Activity   Alcohol use: Yes    Comment: rare   Drug use: No   Sexual activity: Never  Other Topics Concern   Not on file  Social History Narrative   Single - divorced x 3 -   Lives with 2 sons, enjoys spending time with 6 g-kids and 3 kids   Social Determinants of Health   Financial Resource Strain: Not on file  Food  Insecurity: Not on file  Transportation Needs: Not on file  Physical Activity: Not on file  Stress: Not on file  Social Connections: Not on file    His Allergies Are:  Allergies  Allergen Reactions   Ambien [Zolpidem Tartrate] Other (See Comments)    Patient stated this medication made him "go crazy"   Metformin And Related Diarrhea  :   His Current Medications Are:  Outpatient Encounter Medications as of 09/12/2021  Medication Sig   aspirin EC 81 MG EC tablet Take 1 tablet (81 mg total) by mouth daily.   Aspirin-Acetaminophen-Caffeine (GOODY HEADACHE PO) Take by mouth.   atorvastatin (  LIPITOR) 80 MG tablet Take 1 tablet (80 mg total) by mouth daily.   carvedilol (COREG) 12.5 MG tablet Take 1 tablet (12.5 mg total) by mouth 2 (two) times daily with a meal.   clopidogrel (PLAVIX) 75 MG tablet Take 1 tablet (75 mg total) by mouth daily.   dapagliflozin propanediol (FARXIGA) 10 MG TABS tablet Take 1 tablet (10 mg total) by mouth daily before breakfast.   glucose blood (ACCU-CHEK GUIDE) test strip 1 each by Other route 2 (two) times daily. And lancets 2/day   hydrochlorothiazide (HYDRODIURIL) 25 MG tablet Take 0.5 tablets (12.5 mg total) by mouth daily.   insulin glargine (LANTUS SOLOSTAR) 100 UNIT/ML Solostar Pen Inject 15 Units into the skin every morning. And pen needles 1/day   isosorbide mononitrate (IMDUR) 60 MG 24 hr tablet TAKE 1 & 1/2 (ONE & ONE-HALF) TABLETS BY MOUTH ONCE DAILY   lisinopril (ZESTRIL) 20 MG tablet Take 1 tablet (20 mg total) by mouth daily.   Semaglutide, 2 MG/DOSE, (OZEMPIC, 2 MG/DOSE,) 8 MG/3ML SOPN Inject 2 mg into the skin once a week.   Multiple Vitamin (MULTIVITAMIN WITH MINERALS) TABS tablet Take 1 tablet by mouth daily. (Patient not taking: Reported on 09/12/2021)   No facility-administered encounter medications on file as of 09/12/2021.  :   Review of Systems:  Out of a complete 14 point review of systems, all are reviewed and negative with the  exception of these symptoms as listed below:   Review of Systems  Neurological:        Pt here for  sleep consult  Pt is fatigue,hypertension , sleep study 15 years ago . Pt denies snoring,headaches,CPAP machine     ESS:11 FSS:38    Objective:  Neurological Exam  Physical Exam Physical Examination:   Vitals:   09/12/21 1024  BP: (!) 150/93  Pulse: 68    General Examination: The patient is a very pleasant 66 y.o. male in no acute distress. He appears well-developed and well-nourished and well groomed.   HEENT: Normocephalic, atraumatic, pupils are equal, round and reactive to light, extraocular tracking is good without limitation to gaze excursion or nystagmus noted. Hearing is grossly intact. Face is symmetric with normal facial animation. Speech is clear with no dysarthria noted. There is no hypophonia. There is no lip, neck/head, jaw or voice tremor. Neck is supple with full range of passive and active motion. There are no carotid bruits on auscultation. Oropharynx exam reveals: mild mouth dryness, adequate dental hygiene with several missing teeth, particularly front teeth in the top and bottom.  No partials.  Moderate airway crowding secondary to redundant soft palate and wider uvula.  Mallampati class III.  Neck circumference of 17 inches.  Tonsils on the smaller side, right side easier to see than left. He has an underbite.   Chest: Clear to auscultation without wheezing, rhonchi or crackles noted.  Heart: S1+S2+0, regular and normal without murmurs, rubs or gallops noted.   Abdomen: Soft, non-tender and non-distended.  Extremities: There is no pitting edema in the distal lower extremities bilaterally.   Skin: Warm and dry without trophic changes noted.   Musculoskeletal: exam reveals no obvious joint deformities.   Neurologically:  Mental status: The patient is awake, alert and oriented in all 4 spheres. His immediate and remote memory, attention, language skills and  fund of knowledge are appropriate. There is no evidence of aphasia, agnosia, apraxia or anomia. Speech is clear with normal prosody and enunciation. Thought process is linear. Mood is  normal and affect is normal.  Cranial nerves II - XII are as described above under HEENT exam.  Motor exam: Normal bulk, strength and tone is noted. There is no obvious tremor. Fine motor skills and coordination: grossly intact.  Cerebellar testing: No dysmetria or intention tremor. There is no truncal or gait ataxia.  Sensory exam: intact to light touch in the upper and lower extremities.  Gait, station and balance: He stands easily. No veering to one side is noted. No leaning to one side is noted. Posture is age-appropriate and stance is narrow based. Gait shows normal stride length and normal pace. No problems turning are noted.   Assessment and Plan:  In summary, BARRIE WALE is a very pleasant 66 y.o.-year old male with an underlying complex medical history of coronary artery disease with status post MI, cardiomyopathy, history of V. tach with status post ablation, congestive heart failure, hypertension, hyperlipidemia, diabetes, prostate cancer with status post radical prostatectomy, asthma, history of PVCs, and overweight state, who presents for evaluation of his obstructive sleep apnea.  He was diagnosed with overall mild obstructive sleep apnea in 2014.  He has had a 20 pound weight loss in the interim.  He continues to work on healthy weight and good diabetes control, he is supposed to transition to a new endocrinologist as I understand.  I had a long chat with the patient about my findings and the diagnosis of sleep apnea, particularly OSA, its prognosis and treatment options. We talked about medical/conservative treatments, surgical interventions and non-pharmacological approaches for symptom control. I explained, in particular, the risks and ramifications of untreated moderate to severe OSA, especially with  respect to developing cardiovascular disease down the road, including congestive heart failure (CHF), difficult to treat hypertension, cardiac arrhythmias (particularly A-fib), neurovascular complications including TIA, stroke and dementia. Even type 2 diabetes has, in part, been linked to untreated OSA. Symptoms of untreated OSA may include (but may not be limited to) daytime sleepiness, nocturia (i.e. frequent nighttime urination), memory problems, mood irritability and suboptimally controlled or worsening mood disorder such as depression and/or anxiety, lack of energy, lack of motivation, physical discomfort, as well as recurrent headaches, especially morning or nocturnal headaches. We talked about the importance of maintaining a healthy lifestyle and striving for healthy weight.  I recommended the following at this time: sleep study.  I outlined the differences between a laboratory attended sleep study which is considered more comprehensive and accurate over the option of a home sleep test (HST); the latter may lead to underestimation of sleep disordered breathing in some instances and does not help with diagnosing upper airway resistance syndrome and is not accurate enough to diagnose primary central sleep apnea typically. I explained the different sleep test procedures to the patient in detail and also outlined possible surgical and non-surgical treatment options of OSA, including the use of a pressure airway pressure (PAP) device (ie CPAP, AutoPAP/APAP or BiPAP in certain circumstances), a custom-made dental device (aka oral appliance, which would require a referral to a specialist dentist or orthodontist typically, and is generally speaking not considered a good choice for patients with full dentures or edentulous state), upper airway surgical options, such as traditional UPPP (which is not considered a first-line treatment) or the Inspire device (hypoglossal nerve stimulator, which would involve a  referral for consultation with an ENT surgeon, after careful selection, following inclusion criteria). I explained the PAP treatment option to the patient in detail, as this is generally considered first-line treatment.  The patient indicated that he would be willing to try PAP therapy, if the need arises. I explained the importance of being compliant with PAP treatment, not only for insurance purposes but primarily to improve patient's symptoms symptoms, and for the patient's long term health benefit, including to reduce His cardiovascular risks longer-term.    We will pick up our discussion about the next steps and treatment options after testing.  We will keep him posted as to the test results by phone call and/or MyChart messaging where possible.  We will plan to follow-up in sleep clinic accordingly as well.  I answered all his questions today and the patient  was in agreement.   I encouraged him to call with any interim questions, concerns, problems or updates or email [place] through [facility].  Generally speaking, sleep test authorizations may take up to 2 weeks, sometimes less, sometimes longer, the patient is encouraged to get in touch with [place] if they do not hear back from the sleep lab staff directly within the next 2 weeks.  Thank you very much for allowing me to participate in the care of this nice patient. If I can be of any further assistance to you please do not hesitate to call me at [phone].  Sincerely,   Star Age, MD, PhD

## 2021-09-16 ENCOUNTER — Other Ambulatory Visit (INDEPENDENT_AMBULATORY_CARE_PROVIDER_SITE_OTHER): Payer: BC Managed Care – PPO

## 2021-09-16 ENCOUNTER — Other Ambulatory Visit (HOSPITAL_COMMUNITY): Payer: Self-pay

## 2021-09-16 DIAGNOSIS — E1165 Type 2 diabetes mellitus with hyperglycemia: Secondary | ICD-10-CM | POA: Diagnosis not present

## 2021-09-16 LAB — HEMOGLOBIN A1C: Hgb A1c MFr Bld: 7 % — ABNORMAL HIGH (ref 4.6–6.5)

## 2021-09-16 LAB — GLUCOSE, RANDOM: Glucose, Bld: 114 mg/dL — ABNORMAL HIGH (ref 70–99)

## 2021-09-18 ENCOUNTER — Encounter: Payer: Self-pay | Admitting: Endocrinology

## 2021-09-18 ENCOUNTER — Ambulatory Visit (INDEPENDENT_AMBULATORY_CARE_PROVIDER_SITE_OTHER): Payer: BC Managed Care – PPO | Admitting: Endocrinology

## 2021-09-18 ENCOUNTER — Other Ambulatory Visit (HOSPITAL_COMMUNITY): Payer: Self-pay

## 2021-09-18 VITALS — BP 156/94 | HR 76 | Ht 65.0 in | Wt 167.6 lb

## 2021-09-18 DIAGNOSIS — I10 Essential (primary) hypertension: Secondary | ICD-10-CM | POA: Diagnosis not present

## 2021-09-18 DIAGNOSIS — N183 Chronic kidney disease, stage 3 unspecified: Secondary | ICD-10-CM

## 2021-09-18 DIAGNOSIS — E1122 Type 2 diabetes mellitus with diabetic chronic kidney disease: Secondary | ICD-10-CM | POA: Diagnosis not present

## 2021-09-18 DIAGNOSIS — E1165 Type 2 diabetes mellitus with hyperglycemia: Secondary | ICD-10-CM

## 2021-09-18 MED ORDER — AMLODIPINE BESYLATE 5 MG PO TABS: 5.0000 mg | ORAL_TABLET | Freq: Every day | ORAL | 2 refills | Status: DC

## 2021-09-18 NOTE — Telephone Encounter (Signed)
Got it! FYI if things that are placed in media tab are not routed to clinical team or provider we don't know that its there. I have printed it out and will give to Dr Dwyane Dee for completion and he is aware you are awaiting notes for today.

## 2021-09-18 NOTE — Patient Instructions (Addendum)
Check blood sugars on waking up 3-4 days a week  Also check blood sugars about 2 hours after meals and do this after different meals by rotation  Recommended blood sugar levels on waking up are 90-130 and about 2 hours after meal is 130-180  Please bring your blood sugar monitor to each visit, thank you  Check BP

## 2021-09-18 NOTE — Progress Notes (Signed)
Patient ID: Bradley SHAMOON, male   DOB: Feb 02, 1955, 66 y.o.   MRN: [id number]           Reason for Appointment: Type II Diabetes follow-up   History of Present Illness   Diagnosis date: 2008  Previous history:  Non-insulin hypoglycemic drugs previously used: Farxiga, Actos, Ozempic Insulin was started in 2018 when his A1c was 15.6  A1c range in the last few years is: 6.7-15.6 Highest A1c was in 2018  Recent history:     Non-insulin hypoglycemic drugs: Farxiga 10 mg daily, Ozempic 2 mg weekly 7/22     Insulin regimen: Lantus 15 units daily        Side effects from medications: None  Current self management, blood sugar patterns and problems identified:  A1c is 7% and stable this year He did not bring his monitor for download and unclear how often he checks his blood sugars Also does not check any readings after meals but generally around 120, was higher this morning Occasionally when he goes off his diet and eats dessert and drinks regular soft drinks his blood sugar will be higher His blood sugars were uncontrolled in 2022 and A1c as high as 12.8 but has improved significantly with adding Ozempic in 07/2020 For insurance he is still trying to get approval and has not been able to get his prescription for a month He does not do any formal exercise but he says that he is relatively busy working long hours Does not have any meal plan and has not had much diabetes education Does also take Wilder Glade partly because of his CKD  Exercise: none  Diet management: Usually eating 3 meals a day     Hypoglycemia:  none    Glucometer: Accu-Chek?           Blood Glucose readings from recall:  PRE-MEAL Fasting Lunch Dinner Bedtime Overall  Glucose range: 120-150      Mean/median:          Dietician visit: Most recent:   none   Weight control:  Wt Readings from Last 3 Encounters:  09/18/21 167 lb 9.6 oz (76 kg)  09/12/21 163 lb 6.4 oz (74.1 kg)  08/05/21 161 lb (73 kg)             Diabetes labs:  Lab Results  Component Value Date   HGBA1C 7.0 (H) 09/16/2021   HGBA1C 6.7 (A) 04/17/2021   HGBA1C 7.0 (A) 02/14/2021   Lab Results  Component Value Date   MICROALBUR 0.9 10/15/2016   LDLCALC 46 07/26/2021   CREATININE 1.52 (H) 08/05/2021    No results found for: "FRUCTOSAMINE"   Allergies as of 09/18/2021       Reactions   Ambien [zolpidem Tartrate] Other (See Comments)   Patient stated this medication made him "go crazy"   Metformin And Related Diarrhea        Medication List        Accurate as of September 18, 2021  4:53 PM. If you have any questions, ask your nurse or doctor.          Accu-Chek Guide test strip Generic drug: glucose blood 1 each by Other route 2 (two) times daily. And lancets 2/day   amLODipine 5 MG tablet Commonly known as: NORVASC Take 1 tablet (5 mg total) by mouth daily. Started by: Elayne Snare, MD   aspirin EC 81 MG tablet Take 1 tablet (81 mg total) by mouth daily.   atorvastatin 80 MG tablet  Commonly known as: LIPITOR Take 1 tablet (80 mg total) by mouth daily.   carvedilol 12.5 MG tablet Commonly known as: COREG Take 1 tablet (12.5 mg total) by mouth 2 (two) times daily with a meal.   clopidogrel 75 MG tablet Commonly known as: PLAVIX Take 1 tablet (75 mg total) by mouth daily.   dapagliflozin propanediol 10 MG Tabs tablet Commonly known as: Farxiga Take 1 tablet (10 mg total) by mouth daily before breakfast.   GOODY HEADACHE PO Take by mouth.   hydrochlorothiazide 25 MG tablet Commonly known as: HYDRODIURIL Take 0.5 tablets (12.5 mg total) by mouth daily.   isosorbide mononitrate 60 MG 24 hr tablet Commonly known as: IMDUR TAKE 1 & 1/2 (ONE & ONE-HALF) TABLETS BY MOUTH ONCE DAILY   Lantus SoloStar 100 UNIT/ML Solostar Pen Generic drug: insulin glargine Inject 15 Units into the skin every morning. And pen needles 1/day   lisinopril 20 MG tablet Commonly known as: ZESTRIL Take 1 tablet  (20 mg total) by mouth daily.   multivitamin with minerals Tabs tablet Take 1 tablet by mouth daily.   Ozempic (2 MG/DOSE) 8 MG/3ML Sopn Generic drug: Semaglutide (2 MG/DOSE) Inject 2 mg into the skin once a week.        Allergies:  Allergies  Allergen Reactions   Ambien [Zolpidem Tartrate] Other (See Comments)    Patient stated this medication made him "go crazy"   Metformin And Related Diarrhea    Past Medical History:  Diagnosis Date   Asthma    CAD (coronary artery disease)    a. non obst dz by cath 2004  b. 11/12/15 which showed no obvious large vessel CAD. No PCI or clear reason for NSTEMI ( possibly hypertensive urgency?).    CARDIOMYOPATHY    a. NICM, LVEF 30% 01/2011 echo; 20-25% 07/2012 echo, EF recovered by echo 06/2015.   Congestive heart failure (CHF) ([place])    DIABETES MELLITUS, TYPE II    HYPERCHOLESTEROLEMIA    HYPERTENSION    Myocardial infarct ([place]) 10/2015   Noncompliance    Prostate cancer ([place])    prostate cancer   PVCs (premature ventricular contractions)    a. s/p ablation 01-07-2013 by Dr Rayann Heman    Past Surgical History:  Procedure Laterality Date   ABLATION  01-07-2013   RVOT PVC's ablated by Dr Rayann Heman along anteroseptal RVOT   CARDIAC CATHETERIZATION  2004   nonobst dz   CARDIAC CATHETERIZATION N/A 11/12/2015   Procedure: Left Heart Cath and Coronary Angiography;  Surgeon: Sherren Mocha, MD;  Location: [facility];  Service: Cardiovascular;  Laterality: N/A;   CHOLECYSTECTOMY N/A 11/16/2013   Procedure: LAPAROSCOPIC CHOLECYSTECTOMY WITH INTRAOPERATIVE CHOLANGIOGRAM;  Surgeon: [name], MD;  Location: [facility];  Service: General;  Laterality: N/A;   LYMPHADENECTOMY Bilateral 02/22/2014   Procedure: PELVIC LYMPH NODE DISSECTION;  Surgeon: [name], MD;  Location: [facility];  Service: Urology;  Laterality: Bilateral;   PROSTATE SURGERY     ROBOT ASSISTED LAPAROSCOPIC RADICAL PROSTATECTOMY N/A 02/22/2014   Procedure: ROBOTIC  ASSISTED LAPAROSCOPIC RADICAL PROSTATECTOMY WITH INDOCYANINE GREEN DYE AND OPEN UMBILICAL HERNIA REPAIR;  Surgeon: [name], MD;  Location: [facility];  Service: Urology;  Laterality: N/A;   SUPRAVENTRICULAR TACHYCARDIA ABLATION N/A 09/28/2012   Procedure: Tanna Furry Ablation;  Surgeon: [name], MD;  Location: [facility];  Service: Cardiovascular;  Laterality: N/A;   SURGERY SCROTAL / TESTICULAR     at age 27   V-TACH ABLATION N/A 01/07/2013   Procedure:  V-TACH ABLATION;  Surgeon: [name], MD;  Location: [place] [facility];  Service: Cardiovascular;  Laterality: N/A;    Family History  Problem Relation Age of Onset   Hypertension Mother    Peripheral vascular disease Mother    Diabetes Mother    Colon cancer Sister    Prostate cancer Brother    Lung cancer Brother    Prostate cancer Brother    Prostate cancer Brother    Prostate cancer Brother    Pancreatic cancer Paternal Aunt    Heart failure Maternal Grandmother    Heart attack Paternal Grandfather    Diabetes Son    Peripheral vascular disease Other    Hypertension Other    Prostate cancer Other    Esophageal cancer Neg Hx    Rectal cancer Neg Hx    Stomach cancer Neg Hx    Sleep apnea Neg Hx     Social History:  reports that he has never smoked. He has never used smokeless tobacco. He reports current alcohol use. He reports that he does not use drugs.  Review of Systems:  Last diabetic eye exam date: This is overdue  Last urine microalbumin date: 2018, not available  Last foot exam date: 8/22  Symptoms of neuropathy: rare tingling in the feet  Hypertension:   ACE/ARB medication: None  Not checking home BP  BP Readings from Last 3 Encounters:  09/18/21 (!) 156/94  09/12/21 (!) 150/93  08/05/21 (!) 124/92   He does have CKD which has not been followed by her nephrologist  Lab Results  Component Value Date   CREATININE 1.52 (H) 08/05/2021   CREATININE 1.55 (H) 07/26/2021   CREATININE 1.94 (H) 04/11/2021      Lipid management: Very well controlled with 80 mg Lipitor    Lab Results  Component Value Date   CHOL 93 07/26/2021   CHOL 154 03/12/2020   CHOL 86 (L) 10/11/2018   Lab Results  Component Value Date   HDL 31.40 (L) 07/26/2021   HDL 36.40 (L) 03/12/2020   HDL 30 (L) 10/11/2018   Lab Results  Component Value Date   LDLCALC 46 07/26/2021   LDLCALC 84 03/12/2020   LDLCALC 39 10/11/2018   Lab Results  Component Value Date   TRIG 76.0 07/26/2021   TRIG 166.0 (H) 03/12/2020   TRIG 210 (H) 02/06/2019   Lab Results  Component Value Date   CHOLHDL 3 07/26/2021   CHOLHDL 4 03/12/2020   CHOLHDL 2.9 10/11/2018   Lab Results  Component Value Date   LDLDIRECT 148.8 04/25/2009     Fibrosis 4 Score = .88       Fib-4 interpretation is not validated for people under 75 or over 66 years of age.      Examination:   BP (!) 156/94   Pulse 76   Ht '5\' 5"'$  (1.651 m)   Wt 167 lb 9.6 oz (76 kg)   SpO2 96%   BMI 27.89 kg/m   Body mass index is 27.89 kg/m.    ASSESSMENT/ PLAN:    Diabetes type 2:   Current regimen: Farxiga, basal insulin and Ozempic  See history of present illness for detailed discussion of current diabetes management, blood sugar patterns and problems identified Although his A1c is fairly good his blood sugars are likely higher postprandially especially with not getting his Ozempic recently He does need significant amount of diabetes education and advice on day-to-day management Currently waiting for insurance prior authorization for Ozempic However  this did help him significantly as last year his A1c had gone up to 12.8 and improved without needing mealtime insulin apparently  A1c is 7%  Recommendations:  He does need to start checking his blood sugars consistently after meals and not just in the morning Discussed blood sugar targets both fasting and after meals Since he likely can get coverage for CGM he will be prescribed the freestyle libre  reader and sensors which appear to be preferred compared to Dexcom To get the reader he will be using the DME supplier When he is able to get this will start him on the sensor with the help of the diabetes educator Also will need significant education regarding meal planning and general diabetes management We will continue Ozempic as this has been helpful in his blood sugar control and currently is the most effective option other than basal insulin, this will significantly help his postprandial readings and maintain his weight at current level Since he is out of the medication he can try the 1 mg dose weekly with a sample given today and then go to 2 mg once the prior authorization is complete  Hypertension: This is fairly significant and not controlled with above regimen He does need to be having additional treatment for getting his blood pressure to goal especially diastolic We will start 5 mg Norvasc daily Also we will plan to do aldosterone/renin level on his next visit  CKD: Will need evaluation with urine microalbumin but also recommend nephrology consultation   Patient Instructions  Check blood sugars on waking up 3-4 days a week  Also check blood sugars about 2 hours after meals and do this after different meals by rotation  Recommended blood sugar levels on waking up are 90-130 and about 2 hours after meal is 130-180  Please bring your blood sugar monitor to each visit, thank you  Check BP  Total visit time for review of extensive relevant records/labs, evaluation and management of diabetes, hypertension, chronic kidney disease and preventative care = 40 minutes  Elayne Snare 09/18/2021, 4:53 PM

## 2021-09-19 ENCOUNTER — Other Ambulatory Visit (HOSPITAL_COMMUNITY): Payer: Self-pay

## 2021-09-19 NOTE — Telephone Encounter (Signed)
Patient Advocate Encounter  Prior Authorization for Ozempic 2 MG has been approved.  Test billing indicates patient has $0 copay for this medication. EOC ID: [id number]  Clista Bernhardt, CPhT Rx Patient Advocate Specialist Phone: [phone]

## 2021-09-20 NOTE — Telephone Encounter (Signed)
Patient has been notified of approval

## 2021-09-20 NOTE — Telephone Encounter (Signed)
Patient notified

## 2021-10-02 ENCOUNTER — Telehealth: Payer: Self-pay

## 2021-10-02 NOTE — Telephone Encounter (Signed)
We have attempted to call the patient to schedule sleep study.  Patient has been unavailable at the phone numbers we have on file (vm not setup)and has not returned our calls.  If patient calls back we will schedule them for their sleep study.

## 2021-10-28 ENCOUNTER — Encounter: Payer: Self-pay | Admitting: Cardiology

## 2021-10-28 ENCOUNTER — Ambulatory Visit: Payer: BC Managed Care – PPO | Attending: Cardiology | Admitting: Cardiology

## 2021-10-28 VITALS — BP 120/70 | HR 85 | Ht 65.0 in | Wt 160.0 lb

## 2021-10-28 DIAGNOSIS — E1169 Type 2 diabetes mellitus with other specified complication: Secondary | ICD-10-CM | POA: Diagnosis not present

## 2021-10-28 DIAGNOSIS — Z794 Long term (current) use of insulin: Secondary | ICD-10-CM

## 2021-10-28 DIAGNOSIS — R55 Syncope and collapse: Secondary | ICD-10-CM | POA: Diagnosis not present

## 2021-10-28 DIAGNOSIS — I428 Other cardiomyopathies: Secondary | ICD-10-CM | POA: Diagnosis not present

## 2021-10-28 NOTE — Patient Instructions (Signed)
Medication Instructions:  Your physician recommends that you continue on your current medications as directed. Please refer to the Current Medication list given to you today.  *If you need a refill on your cardiac medications before your next appointment, please call your pharmacy*   Lab Work: NONE If you have labs (blood work) drawn today and your tests are completely normal, you will receive your results only by: [facility] (if you have MyChart) OR A paper copy in the mail If you have any lab test that is abnormal or we need to change your treatment, we will call you to review the results.   Testing/Procedures: NONE   Follow-Up: At [facility], you and your health needs are our priority.  As part of our continuing mission to provide you with exceptional heart care, we have created designated Provider Care Teams.  These Care Teams include your primary Cardiologist (physician) and Advanced Practice Providers (APPs -  Physician Assistants and Nurse Practitioners) who all work together to provide you with the care you need, when you need it.  We recommend signing up for the patient portal called "MyChart".  Sign up information is provided on this After Visit Summary.  MyChart is used to connect with patients for Virtual Visits (Telemedicine).  Patients are able to view lab/test results, encounter notes, upcoming appointments, etc.  Non-urgent messages can be sent to your provider as well.   To learn more about what you can do with MyChart, go to [email or web address].    Your next appointment:   6 month(s)  The format for your next appointment:   In Person  Provider:   Nicholes Rough, PA-C     Then, Candee Furbish, MD will plan to see you again in 1 year(s).    Important Information About Sugar

## 2021-10-28 NOTE — Progress Notes (Signed)
Cardiology Office Note:    Date:  10/28/2021   ID:  [name], DOB 10-28-1955, MRN [id number]  PCP:  Marrian Salvage, [facility] Providers Cardiologist:  Candee Furbish, MD     Referring MD: Marrian Salvage,*    History of Present Illness:    [name] is a 66 y.o. male here for the follow-up of coronary artery disease, non-ischemic cardiomyopathy, and hypertension.  He was seen by Nicholes Rough, PA-C on 08/05/2021. He complained of stable fatigue with climbing stairs. Follow-up Echo 07/2021 with LVEF 50-55%, severe concentric LVH, and grade 1 diastolic dysfunction.   He was seen in the ED 3/23 for lightheadedness and feeling short of breath and fatigue with exertion. Suspected etiology was hypovolemia which was corrected intravenously.    Non-obstructive coronary artery disease per heart catheterization 10/2015.    On Plavix, tolerating well.  At his last appointment he complained of shortness of breath and feeling more fatigued with activity. We pursued a pharmacologic stress test for further restratification, which in 06/2020 showed no ischemia or infarct pattern. EF was calculated 42%, however ECHO at that time was normal. May be falsely low.   Today: Overall he appears well. However, he does complain of becoming fatigued more easily than he used to.  He is struggling with obtaining a CGM due to insurance coverage issues. He continues to work with his endocrinologist, Dr. Dwyane Dee.  He continues to follow a work schedule of 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.  He denies any palpitations, chest pain, shortness of breath, or peripheral edema. No lightheadedness, headaches, syncope, orthopnea, or PND.   Past Medical History:  Diagnosis Date   Asthma    CAD (coronary artery disease)    a. non obst dz by cath 2004  b. 11/12/15 which showed no obvious large vessel CAD. No PCI or clear reason for NSTEMI ( possibly hypertensive urgency?).    CARDIOMYOPATHY     a. NICM, LVEF 30% 01/2011 echo; 20-25% 07/2012 echo, EF recovered by echo 06/2015.   Congestive heart failure (CHF) ([place])    DIABETES MELLITUS, TYPE II    HYPERCHOLESTEROLEMIA    HYPERTENSION    Myocardial infarct ([place]) 10/2015   Noncompliance    Prostate cancer ([place])    prostate cancer   PVCs (premature ventricular contractions)    a. s/p ablation 01-07-2013 by Dr Rayann Heman    Past Surgical History:  Procedure Laterality Date   ABLATION  01-07-2013   RVOT PVC's ablated by Dr Rayann Heman along anteroseptal RVOT   CARDIAC CATHETERIZATION  2004   nonobst dz   CARDIAC CATHETERIZATION N/A 11/12/2015   Procedure: Left Heart Cath and Coronary Angiography;  Surgeon: Sherren Mocha, MD;  Location: [facility];  Service: Cardiovascular;  Laterality: N/A;   CHOLECYSTECTOMY N/A 11/16/2013   Procedure: LAPAROSCOPIC CHOLECYSTECTOMY WITH INTRAOPERATIVE CHOLANGIOGRAM;  Surgeon: [name], MD;  Location: [facility];  Service: General;  Laterality: N/A;   LYMPHADENECTOMY Bilateral 02/22/2014   Procedure: PELVIC LYMPH NODE DISSECTION;  Surgeon: [name], MD;  Location: [facility];  Service: Urology;  Laterality: Bilateral;   PROSTATE SURGERY     ROBOT ASSISTED LAPAROSCOPIC RADICAL PROSTATECTOMY N/A 02/22/2014   Procedure: ROBOTIC ASSISTED LAPAROSCOPIC RADICAL PROSTATECTOMY WITH INDOCYANINE GREEN DYE AND OPEN UMBILICAL HERNIA REPAIR;  Surgeon: [name], MD;  Location: [facility];  Service: Urology;  Laterality: N/A;   SUPRAVENTRICULAR TACHYCARDIA ABLATION N/A 09/28/2012   Procedure: Tanna Furry Ablation;  Surgeon: [name], MD;  Location: [facility];  Service: Cardiovascular;  Laterality: N/A;   SURGERY SCROTAL / TESTICULAR     at age 2   V-TACH ABLATION N/A 01/07/2013   Procedure: V-TACH ABLATION;  Surgeon: Coralyn Oron Westrup, MD;  Location: [facility];  Service: Cardiovascular;  Laterality: N/A;    Current Medications: Current Meds  Medication Sig   amLODipine (NORVASC) 5 MG tablet Take 1 tablet  (5 mg total) by mouth daily.   aspirin EC 81 MG EC tablet Take 1 tablet (81 mg total) by mouth daily.   Aspirin-Acetaminophen-Caffeine (GOODY HEADACHE PO) Take by mouth.   atorvastatin (LIPITOR) 80 MG tablet Take 1 tablet (80 mg total) by mouth daily.   carvedilol (COREG) 12.5 MG tablet Take 1 tablet (12.5 mg total) by mouth 2 (two) times daily with a meal.   clopidogrel (PLAVIX) 75 MG tablet Take 1 tablet (75 mg total) by mouth daily.   dapagliflozin propanediol (FARXIGA) 10 MG TABS tablet Take 1 tablet (10 mg total) by mouth daily before breakfast.   glucose blood (ACCU-CHEK GUIDE) test strip 1 each by Other route 2 (two) times daily. And lancets 2/day   hydrochlorothiazide (HYDRODIURIL) 25 MG tablet Take 0.5 tablets (12.5 mg total) by mouth daily.   insulin glargine (LANTUS SOLOSTAR) 100 UNIT/ML Solostar Pen Inject 15 Units into the skin every morning. And pen needles 1/day   isosorbide mononitrate (IMDUR) 60 MG 24 hr tablet TAKE 1 & 1/2 (ONE & ONE-HALF) TABLETS BY MOUTH ONCE DAILY   lisinopril (ZESTRIL) 20 MG tablet Take 1 tablet (20 mg total) by mouth daily.   Multiple Vitamin (MULTIVITAMIN WITH MINERALS) TABS tablet Take 1 tablet by mouth daily.   Semaglutide, 2 MG/DOSE, (OZEMPIC, 2 MG/DOSE,) 8 MG/3ML SOPN Inject 2 mg into the skin once a week.     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  Scientific laboratory technician Use: Never used  Substance and Sexual Activity   Alcohol use: Yes    Comment: rare   Drug use: No   Sexual activity:  Never  Other Topics Concern   Not on file  Social History Narrative   Single - divorced x 3 -   Lives with 2 sons, enjoys spending time with 6 g-kids and 3 kids   Social Determinants of Health   Financial Resource Strain: Not on file  Food Insecurity: Not on file  Transportation Needs: Not on file  Physical Activity: Not on file  Stress: Not on file  Social Connections: Not on file     Family History: The patient's family history includes Colon cancer in his sister; Diabetes in his mother and son; Heart attack in his paternal grandfather; Heart failure in his maternal grandmother; Hypertension in his mother and another family member; Lung cancer in his brother; Pancreatic cancer in his paternal aunt; Peripheral vascular disease in his mother and another family member; Prostate cancer in his brother, brother, brother, brother and another family  member. There is no history of Esophageal cancer, Rectal cancer, Stomach cancer, or Sleep apnea.  ROS:   Please see the history of present illness.  (+) Fatigue All other systems reviewed and are negative.  EKGs/Labs/Other Studies Reviewed:    The following studies were reviewed today:  Echo  08/20/2021:  1. Left ventricular ejection fraction, by estimation, is 50 to 55%. The  left ventricle has low normal function. The left ventricle has no regional  wall motion abnormalities. There is severe concentric left ventricular  hypertrophy. Left ventricular  diastolic parameters are consistent with Grade I diastolic dysfunction  (impaired relaxation).   2. Right ventricular systolic function is normal. The right ventricular  size is normal. Tricuspid regurgitation signal is inadequate for assessing  PA pressure.   3. The mitral valve is normal in structure. No evidence of mitral valve  regurgitation. No evidence of mitral stenosis.   4. The aortic valve is tricuspid. Aortic valve regurgitation is not  visualized. No aortic stenosis is  present.   Comparison(s): No significant change from prior study.   Nuclear Stress Test  07/04/2020: The left ventricular ejection fraction is moderately decreased (30-44%). Nuclear stress EF: 42%. There was no ST segment deviation noted during stress. No T wave inversion was noted during stress. There is normal myocardial perfusion without evidence of ischemia or infarct. This is an intermediate risk study due to moderately reduced LVEF. Results are similar to prior stress test in 2020.   Echo 04/21/20: IMPRESSIONS    1. Left ventricular ejection fraction, by estimation, is 60 to 65%. The  left ventricle has normal function. The left ventricle has no regional  wall motion abnormalities. There is mild concentric left ventricular  hypertrophy. Left ventricular diastolic  parameters are consistent with Grade I diastolic dysfunction (impaired  relaxation). Elevated left ventricular end-diastolic pressure.   2. Right ventricular systolic function is normal. The right ventricular  size is normal. Tricuspid regurgitation signal is inadequate for assessing  PA pressure.   3. Left atrial size was mildly dilated.   4. The mitral valve is normal in structure. No evidence of mitral valve  regurgitation. No evidence of mitral stenosis.   5. The aortic valve is normal in structure. Aortic valve regurgitation is  not visualized. No aortic stenosis is present.   6. The inferior vena cava is normal in size with greater than 50%  respiratory variability, suggesting right atrial pressure of 3 mmHg.  DG Chest 2 View 06/21/19:  IMPRESSION: Slight scarring left base. Lungs otherwise clear. Cardiac silhouette within normal limits.  Echo 12/16/18: 1. Left ventricular ejection fraction, by visual estimation, is 50 to  55%. The left ventricle has normal function. There is severely increased  left ventricular hypertrophy.   2. Left ventricular diastolic parameters are consistent with Grade I  diastolic  dysfunction (impaired relaxation).   3. Global right ventricle has normal systolic function.The right  ventricular size is normal.   4. Left atrial size was normal.   5. Right atrial size was normal.   6. The mitral valve is normal in structure. Trace mitral valve  regurgitation. No evidence of mitral stenosis.   7. The tricuspid valve is normal in structure. Tricuspid valve  regurgitation is trivial.   8. The aortic valve is tricuspid. Aortic valve regurgitation is not  visualized. No evidence of aortic valve sclerosis or stenosis.   9. The pulmonic valve was normal in structure. Pulmonic valve  regurgitation is not visualized.  10. Low normal  to mildly reduced LV systolic function (EF 50); severe LVH;  grade 1 diastolic dysfunction; trace MR and TR.   Long term Monitor(3-7 days) 12/01/18:  Sinus rhythm with average heart rate of 75 BPM  Occasional premature ventricular contractions and premature atrial contractions (PVC, PAC)  No atrial fibrillation. No adverse arrhtyhmia.   Palpitations are likely a result of PVC, PAC, benign.  Cardiac Catheterization 11/12/15  Mid RCA lesion, 30 %stenosed. Mid LAD lesion, 25 %stenosed. 1st Diag lesion, 30 %stenosed. There is hyperdynamic left ventricular systolic function. The left ventricular ejection fraction is greater than 65% by visual estimate.   1. Mild nonobstructive CAD as outlined, with mild plaque in the mid-RCA, mid-LAD, and first diagonal branch 2. Vigorous LV systolic function with normal LVEDP   No clear explanation for NSTEMI. Hypertensive emergency is a possibility. There is a tiny branch collateral possibly supplying the distal circumflex distribution from the RCA - it is possible this caused NSTEMI but the vessel is very small and occlusion is not visualized.     EKG:  EKG is personally reviewed. 10/28/2021:  EKG was not ordered. 04/11/2021 (ED):  Sinus rhythm at 98 bpm, Incomplete left bundle branch block, Probable  left ventricular hypertrophy. 06/26/20-sinus rhythm, rate 84, LAFB  Recent Labs: 04/11/2021: B Natriuretic Peptide 9.3 07/26/2021: ALT 26 08/05/2021: BUN 17; Creatinine, Ser 1.52; Hemoglobin 13.5; Magnesium 2.2; Platelets 373; Potassium 3.8; Sodium 142; TSH 1.020   Recent Lipid Panel    Component Value Date/Time   CHOL 93 07/26/2021 1033   CHOL 86 (L) 10/11/2018 0955   TRIG 76.0 07/26/2021 1033   TRIG 200 09/20/2007 0000   HDL 31.40 (L) 07/26/2021 1033   HDL 30 (L) 10/11/2018 0955   CHOLHDL 3 07/26/2021 1033   VLDL 15.2 07/26/2021 1033   LDLCALC 46 07/26/2021 1033   LDLCALC 39 10/11/2018 0955   LDLDIRECT 148.8 04/25/2009 0000     Risk Assessment/Calculations:      Physical Exam:    VS:  BP 120/70 (BP Location: Left Arm, Patient Position: Sitting, Cuff Size: Normal)   Pulse 85   Ht '5\' 5"'$  (1.651 m)   Wt 160 lb (72.6 kg)   SpO2 96%   BMI 26.63 kg/m     Wt Readings from Last 3 Encounters:  10/28/21 160 lb (72.6 kg)  09/18/21 167 lb 9.6 oz (76 kg)  09/12/21 163 lb 6.4 oz (74.1 kg)     GEN: Well nourished, well developed in no acute distress HEENT: Normal NECK: No JVD; No carotid bruits LYMPHATICS: No lymphadenopathy CARDIAC: RRR, no murmurs, rubs, gallops RESPIRATORY:  Clear to auscultation without rales, wheezing or rhonchi  ABDOMEN: Soft, non-tender, non-distended MUSCULOSKELETAL:  No edema; No deformity  SKIN: Warm and dry NEUROLOGIC:  Alert and oriented x 3 PSYCHIATRIC:  Normal affect   ASSESSMENT:    No diagnosis found.  PLAN:    In order of problems listed above:  Vasovagal syncope -No recent episodes.  Stable. -Classic symptoms.  He has had this before.  Maintain hydration.  If he begins to feel the prodrome of hot, sweaty he knows to lay down.  Gatorade. -Prior monitor as follows: Sinus rhythm with average heart rate of 75 BPM  Occasional premature ventricular contractions and premature atrial contractions (PVC, PAC)  No atrial fibrillation. No  adverse arrhtyhmia.   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  Had a near death experience he states.  Continue with vaccinations   Prior cardiomyopathy -EF returned to normal.  65% EF excellent.  Continue with goal-directed medical therapy which includes Farxiga 10 mg carvedilol 12.5 mg isosorbide 60 lisinopril 20   Non obstructive coronary artery disease -Continue with aggressive risk factor modification.  Catheterization reviewed.  On atorvastatin.  Plaque stabilization.  Prior LDL 46.   Essential hypertension -LVH noted on echocardiogram.  Good overall blood pressure control.  Continue with current medical management   Diabetes with CKD -Dr. Dwyane Dee.  Creatinine 1.5 last check.  Improved.   Shared Decision Making/Informed Consent The risks [chest pain, shortness of breath, cardiac arrhythmias, dizziness, blood pressure fluctuations, myocardial infarction, stroke/transient ischemic attack, nausea, vomiting, allergic reaction, radiation exposure, metallic taste sensation and life-threatening complications (estimated to be 1 in 10,000)], benefits (risk stratification, diagnosing coronary artery disease, treatment guidance) and alternatives of a nuclear stress test were discussed in detail with [name] and he agrees to proceed.     Follow-up:  6 months with APP. He will follow-up with me in 1 year.  Medication Adjustments/Labs and Tests Ordered: Current medicines are reviewed at length with the patient today.  Concerns regarding medicines are outlined above.   No orders of the defined types were placed in this encounter.  No orders of the defined types were placed in this encounter.  Patient Instructions  Medication Instructions:  Your physician recommends that you continue on your current medications as directed. Please refer to the Current Medication list given to you today.  *If you need a refill on your cardiac medications before your next appointment, please call your  pharmacy*   Lab Work: NONE If you have labs (blood work) drawn today and your tests are completely normal, you will receive your results only by: [facility] (if you have MyChart) OR A paper copy in the mail If you have any lab test that is abnormal or we need to change your treatment, we will call you to review the results.   Testing/Procedures: NONE   Follow-Up: At [facility], you and your health needs are our priority.  As part of our continuing mission to provide you with exceptional heart care, we have created designated Provider Care Teams.  These Care Teams include your primary Cardiologist (physician) and Advanced Practice Providers (APPs -  Physician Assistants and Nurse Practitioners) who all work together to provide you with the care you need, when you need it.  We recommend signing up for the patient portal called "MyChart".  Sign up information is provided on this After Visit Summary.  MyChart is used to connect with patients for Virtual Visits (Telemedicine).  Patients are able to view lab/test results, encounter notes, upcoming appointments, etc.  Non-urgent messages can be sent to your provider as well.   To learn more about what you can do with MyChart, go to [email or web address].    Your next appointment:   6 month(s)  The format for your next appointment:   In Person  Provider:   Nicholes Rough, PA-C     Then, Candee Furbish, MD will plan to see you again in 1 year(s).    Important Information About Sugar         I,Mathew Stumpf,acting as a scribe for Candee Furbish, MD.,have documented all relevant documentation on the behalf of Candee Furbish, MD,as directed by  Candee Furbish, MD while in the presence of Candee Furbish, MD.  I, Candee Furbish, MD, have reviewed all documentation for this visit. The documentation on 10/28/21 for the exam, diagnosis, procedures, and orders are all accurate and  complete.  Signed, Candee Furbish, MD  10/28/2021 10:29 AM    [facility]

## 2021-10-29 ENCOUNTER — Encounter: Payer: Self-pay | Admitting: Family

## 2021-10-29 ENCOUNTER — Ambulatory Visit: Payer: BC Managed Care – PPO | Admitting: Family

## 2021-10-29 VITALS — BP 98/60 | HR 81 | Temp 97.9°F | Ht 65.0 in | Wt 160.8 lb

## 2021-10-29 DIAGNOSIS — Z794 Long term (current) use of insulin: Secondary | ICD-10-CM | POA: Diagnosis not present

## 2021-10-29 DIAGNOSIS — E1169 Type 2 diabetes mellitus with other specified complication: Secondary | ICD-10-CM | POA: Diagnosis not present

## 2021-10-29 MED ORDER — FREESTYLE LIBRE 3 SENSOR MISC
11 refills | Status: DC
Start: 2021-10-29 — End: 2022-08-13

## 2021-10-29 NOTE — Progress Notes (Signed)
[name] is a 66 y.o. male with the following history as recorded in EpicCare:  Patient Active Problem List   Diagnosis Date Noted   Syncope, vasovagal 04/21/2020   Syncope 04/21/2020   COVID-19 virus infection 02/07/2019   Diarrhea 02/07/2019   Acute on chronic renal failure ([place]) 02/06/2019   Routine general medical examination at a health care facility 03/12/2018   Balanitis 10/15/2016   NSTEMI (non-ST elevated myocardial infarction) ([place]) 11/13/2015   PVCs (premature ventricular contractions)    CAD (coronary artery disease)    Acute kidney injury ([place]) 07/19/2015   AKI (acute kidney injury) ([place]) 07/19/2015   Hyperglycemia 07/12/2015   Prostate cancer ([place]) 02/22/2014   Cholelithiasis and cholecystitis without obstruction 11/16/2013   PVC's (premature ventricular contractions) 01/07/2013   CKD (chronic kidney disease) stage 2, GFR 60-89 ml/min 08/25/2012   OSA (obstructive sleep apnea) 54/09/8117   Chronic systolic heart failure ([place]) 08/06/2012   HTN (hypertension), malignant 02/12/2011   Non compliance w medication regimen 02/12/2011   HYPERCHOLESTEROLEMIA 09/17/2009   PREMATURE VENTRICULAR CONTRACTIONS 09/17/2009   PSA, INCREASED 04/26/2009   Type 2 diabetes mellitus ([place]) 04/25/2009   CARDIOMYOPATHY 04/25/2009    Current Outpatient Medications  Medication Sig Dispense Refill   amLODipine (NORVASC) 5 MG tablet Take 1 tablet (5 mg total) by mouth daily. 30 tablet 2   aspirin EC 81 MG EC tablet Take 1 tablet (81 mg total) by mouth daily.     Aspirin-Acetaminophen-Caffeine (GOODY HEADACHE PO) Take by mouth.     atorvastatin (LIPITOR) 80 MG tablet Take 1 tablet (80 mg total) by mouth daily. 90 tablet 3   carvedilol (COREG) 12.5 MG tablet Take 1 tablet (12.5 mg total) by mouth 2 (two) times daily with a meal. 60 tablet 5   clopidogrel (PLAVIX) 75 MG tablet Take 1 tablet (75 mg total) by mouth daily. 90 tablet 3   Continuous Blood Gluc Sensor (FREESTYLE LIBRE 3  SENSOR) MISC Use as directed to check blood sugar 1 each 11   dapagliflozin propanediol (FARXIGA) 10 MG TABS tablet Take 1 tablet (10 mg total) by mouth daily before breakfast. 90 tablet 3   glucose blood (ACCU-CHEK GUIDE) test strip 1 each by Other route 2 (two) times daily. And lancets 2/day 200 each 3   hydrochlorothiazide (HYDRODIURIL) 25 MG tablet Take 0.5 tablets (12.5 mg total) by mouth daily. 15 tablet 11   insulin glargine (LANTUS SOLOSTAR) 100 UNIT/ML Solostar Pen Inject 15 Units into the skin every morning. And pen needles 1/day 15 mL 3   isosorbide mononitrate (IMDUR) 60 MG 24 hr tablet TAKE 1 & 1/2 (ONE & ONE-HALF) TABLETS BY MOUTH ONCE DAILY 45 tablet 11   lisinopril (ZESTRIL) 20 MG tablet Take 1 tablet (20 mg total) by mouth daily. 30 tablet 11   Multiple Vitamin (MULTIVITAMIN WITH MINERALS) TABS tablet Take 1 tablet by mouth daily.     Semaglutide, 2 MG/DOSE, (OZEMPIC, 2 MG/DOSE,) 8 MG/3ML SOPN Inject 2 mg into the skin once a week. 9 mL 3   No current facility-administered medications for this visit.    Allergies: Ambien [zolpidem tartrate] and Metformin and related  Past Medical History:  Diagnosis Date   Asthma    CAD (coronary artery disease)    a. non obst dz by cath 2004  b. 11/12/15 which showed no obvious large vessel CAD. No PCI or clear reason for NSTEMI ( possibly hypertensive urgency?).    CARDIOMYOPATHY    a. NICM, LVEF 30% 01/2011  echo; 20-25% 07/2012 echo, EF recovered by echo 06/2015.   Congestive heart failure (CHF) ([place])    DIABETES MELLITUS, TYPE II    HYPERCHOLESTEROLEMIA    HYPERTENSION    Myocardial infarct ([place]) 10/2015   Noncompliance    Prostate cancer ([place])    prostate cancer   PVCs (premature ventricular contractions)    a. s/p ablation 01-07-2013 by Dr Rayann Heman    Past Surgical History:  Procedure Laterality Date   ABLATION  01-07-2013   RVOT PVC's ablated by Dr Rayann Heman along anteroseptal RVOT   CARDIAC CATHETERIZATION  2004   nonobst dz    CARDIAC CATHETERIZATION N/A 11/12/2015   Procedure: Left Heart Cath and Coronary Angiography;  Surgeon: Sherren Mocha, MD;  Location: [facility];  Service: Cardiovascular;  Laterality: N/A;   CHOLECYSTECTOMY N/A 11/16/2013   Procedure: LAPAROSCOPIC CHOLECYSTECTOMY WITH INTRAOPERATIVE CHOLANGIOGRAM;  Surgeon: [name], MD;  Location: [facility];  Service: General;  Laterality: N/A;   LYMPHADENECTOMY Bilateral 02/22/2014   Procedure: PELVIC LYMPH NODE DISSECTION;  Surgeon: [name], MD;  Location: [facility];  Service: Urology;  Laterality: Bilateral;   PROSTATE SURGERY     ROBOT ASSISTED LAPAROSCOPIC RADICAL PROSTATECTOMY N/A 02/22/2014   Procedure: ROBOTIC ASSISTED LAPAROSCOPIC RADICAL PROSTATECTOMY WITH INDOCYANINE GREEN DYE AND OPEN UMBILICAL HERNIA REPAIR;  Surgeon: [name], MD;  Location: [facility];  Service: Urology;  Laterality: N/A;   SUPRAVENTRICULAR TACHYCARDIA ABLATION N/A 09/28/2012   Procedure: Tanna Furry Ablation;  Surgeon: [name], MD;  Location[facility] CATH LAB;  Service: Cardiovascular;  Laterality: N/A;   SURGERY SCROTAL / TESTICULAR     at age 89   V-TACH ABLATION N/A 01/07/2013   Procedure: V-TACH ABLATION;  Surgeon: [name], MD;  Location[facility] CATH LAB;  Service: Cardiovascular;  Laterality: N/A;    Family History  Problem Relation Age of Onset   Hypertension Mother    Peripheral vascular disease Mother    Diabetes Mother    Colon cancer Sister    Prostate cancer Brother    Lung cancer Brother    Prostate cancer Brother    Prostate cancer Brother    Prostate cancer Brother    Pancreatic cancer Paternal Aunt    Heart failure Maternal Grandmother    Heart attack Paternal Grandfather    Diabetes Son    Peripheral vascular disease Other    Hypertension Other    Prostate cancer Other    Esophageal cancer Neg Hx    Rectal cancer Neg Hx    Stomach cancer Neg Hx    Sleep apnea Neg Hx     Social History   Tobacco Use   Smoking status: Never    Smokeless tobacco: Never   Tobacco comments:    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  Substance Use Topics   Alcohol use: Yes    Comment: rare    Subjective:   3 month follow up on weight; at last OV, weight was down to 160 pounds; felt to be related to [place] but wanted to re-check today for reassurance; does work full time and job is very physical in nature;   Would like Rx for CGM sent to local pharmacy- hoping to be able to get this filled;      Objective:  Vitals:   10/29/21 1006  BP: 98/60  Pulse: 81  Temp: 97.9 F (36.6 C)  TempSrc: Oral  SpO2: 97%  Weight: 160 lb 12.8 oz (72.9 kg)  Height: '5\' 5"'$  (  1.651 m)    General: Well developed, well nourished, in no acute distress  Skin : Warm and dry.  Head: Normocephalic and atraumatic  Lungs: Respirations unlabored;  Neurologic: Alert and oriented; speech intact; face symmetrical; moves all extremities well; CNII-XII intact without focal deficit   Assessment:  1. Type 2 diabetes mellitus with other specified complication, with long-term current use of insulin (HCC)     Plan:  Weight has stabilized- suspect due to Ozempic; will continue to monitor; Rx for [employer] sent to local pharmacy- if unable to get filled, will have patient meet with our in house pharmacist to discuss resource options.   No follow-ups on file.  No orders of the defined types were placed in this encounter.   Requested Prescriptions   Signed Prescriptions Disp Refills   Continuous Blood Gluc Sensor (FREESTYLE LIBRE 3 SENSOR) MISC 1 each 11    Sig: Use as directed to check blood sugar

## 2021-12-15 ENCOUNTER — Other Ambulatory Visit: Payer: Self-pay | Admitting: Endocrinology

## 2022-01-06 ENCOUNTER — Other Ambulatory Visit: Payer: BC Managed Care – PPO

## 2022-01-07 ENCOUNTER — Other Ambulatory Visit (INDEPENDENT_AMBULATORY_CARE_PROVIDER_SITE_OTHER): Payer: BC Managed Care – PPO

## 2022-01-07 ENCOUNTER — Other Ambulatory Visit: Payer: Self-pay | Admitting: Family

## 2022-01-07 DIAGNOSIS — E1165 Type 2 diabetes mellitus with hyperglycemia: Secondary | ICD-10-CM | POA: Diagnosis not present

## 2022-01-07 LAB — MICROALBUMIN / CREATININE URINE RATIO
Creatinine,U: 139.7 mg/dL
Microalb Creat Ratio: 0.5 mg/g (ref 0.0–30.0)
Microalb, Ur: <0.7 mg/dL (ref 0.0–1.9)

## 2022-01-07 LAB — BASIC METABOLIC PANEL
BUN: 27 mg/dL — ABNORMAL HIGH (ref 6–23)
CO2: 27 mEq/L (ref 19–32)
Calcium: 10.2 mg/dL (ref 8.4–10.5)
Chloride: 105 mEq/L (ref 96–112)
Creatinine, Ser: 1.72 mg/dL — ABNORMAL HIGH (ref 0.40–1.50)
GFR: 40.57 mL/min — ABNORMAL LOW (ref >60.00)
Glucose, Bld: 98 mg/dL (ref 70–99)
Potassium: 4.2 mEq/L (ref 3.5–5.1)
Sodium: 140 mEq/L (ref 135–145)

## 2022-01-07 LAB — HEMOGLOBIN A1C: Hgb A1c MFr Bld: 7.1 % — ABNORMAL HIGH (ref 4.6–6.5)

## 2022-01-08 ENCOUNTER — Ambulatory Visit (INDEPENDENT_AMBULATORY_CARE_PROVIDER_SITE_OTHER): Payer: BC Managed Care – PPO | Admitting: Endocrinology

## 2022-01-08 ENCOUNTER — Encounter: Payer: Self-pay | Admitting: Endocrinology

## 2022-01-08 VITALS — BP 128/70 | HR 96 | Ht 65.0 in | Wt 161.0 lb

## 2022-01-08 DIAGNOSIS — E1122 Type 2 diabetes mellitus with diabetic chronic kidney disease: Secondary | ICD-10-CM | POA: Diagnosis not present

## 2022-01-08 DIAGNOSIS — E1165 Type 2 diabetes mellitus with hyperglycemia: Secondary | ICD-10-CM

## 2022-01-08 DIAGNOSIS — N183 Chronic kidney disease, stage 3 unspecified: Secondary | ICD-10-CM

## 2022-01-08 LAB — POCT GLYCOSYLATED HEMOGLOBIN (HGB A1C): Hemoglobin A1C: 7.1 % — AB (ref 4.0–5.6)

## 2022-01-08 NOTE — Progress Notes (Signed)
Patient ID: [name], male   DOB: [birth year], 66 y.o.   MRN: [id number]           Reason for Appointment: Type II Diabetes follow-up   History of Present Illness   Diagnosis date: 2008  Previous history:  Non-insulin hypoglycemic drugs previously used: Farxiga, Actos, Ozempic Insulin was started in 2018 when his A1c was 15.6  A1c range in the last few years is: 6.7-15.6 Highest A1c was in 2018  Recent history:     Non-insulin hypoglycemic drugs: Farxiga 10 mg daily, Ozempic 2 mg weekly      Insulin regimen: Lantus 15 units daily        Side effects from medications: None  Current diabetes management, blood sugar readings and problems identified:  A1c is 7.1 % and stable this year He did bring his monitor for download but the date is wrong and could not be downloaded Only checking fasting readings He has not wanted to use the freestyle libre as prescribed last time and we discussed reportedly related to cost Unclear whether he has high postprandial readings but his fasting readings do fluctuate some  He has generally been able to get his Ozempic filled regularly  He is not doing any exercise and has not been willing to go out to walk much  Weight is about the same Does also take Wilder Glade partly because of his CKD  Exercise: none  Diet management: Usually eating 3 meals a day     Hypoglycemia:  none    Glucometer: Freestyle lite          Blood Glucose readings from review of monitor:  FASTING range 95-146 AVERAGE 123 for prior 30 days No readings after meals Date on the monitor is incorrect   Dietician visit: Most recent:   none   Weight control:  Wt Readings from Last 3 Encounters:  01/08/22 161 lb (73 kg)  10/29/21 160 lb 12.8 oz (72.9 kg)  10/28/21 160 lb (72.6 kg)            Diabetes labs:  Lab Results  Component Value Date   HGBA1C 7.1 (A) 01/08/2022   HGBA1C 7.1 (H) 01/07/2022   HGBA1C 7.0 (H) 09/16/2021   Lab Results  Component  Value Date   MICROALBUR <0.7 01/07/2022   LDLCALC 46 07/26/2021   CREATININE 1.72 (H) 01/07/2022    No results found for: "FRUCTOSAMINE"   Allergies as of 01/08/2022       Reactions   Ambien [zolpidem Tartrate] Other (See Comments)   Patient stated this medication made him "go crazy"   Metformin And Related Diarrhea        Medication List        Accurate as of January 08, 2022  8:54 PM. If you have any questions, ask your nurse or doctor.          Accu-Chek Guide test strip Generic drug: glucose blood 1 each by Other route 2 (two) times daily. And lancets 2/day   amLODipine 5 MG tablet Commonly known as: NORVASC Take 1 tablet by mouth once daily   aspirin EC 81 MG tablet Take 1 tablet (81 mg total) by mouth daily.   atorvastatin 80 MG tablet Commonly known as: LIPITOR Take 1 tablet (80 mg total) by mouth daily.   carvedilol 12.5 MG tablet Commonly known as: COREG TAKE 1 TABLET BY MOUTH TWICE DAILY WITH A MEAL   clopidogrel 75 MG tablet Commonly known as: PLAVIX Take 1 tablet (75  mg total) by mouth daily.   dapagliflozin propanediol 10 MG Tabs tablet Commonly known as: Farxiga Take 1 tablet (10 mg total) by mouth daily before breakfast.   FreeStyle Libre 3 Sensor Misc Use as directed to check blood sugar   GOODY HEADACHE PO Take by mouth.   hydrochlorothiazide 25 MG tablet Commonly known as: HYDRODIURIL Take 0.5 tablets (12.5 mg total) by mouth daily.   isosorbide mononitrate 60 MG 24 hr tablet Commonly known as: IMDUR TAKE 1 & 1/2 (ONE & ONE-HALF) TABLETS BY MOUTH ONCE DAILY   Lantus SoloStar 100 UNIT/ML Solostar Pen Generic drug: insulin glargine Inject 15 Units into the skin every morning. And pen needles 1/day   lisinopril 20 MG tablet Commonly known as: ZESTRIL Take 1 tablet (20 mg total) by mouth daily.   multivitamin with minerals Tabs tablet Take 1 tablet by mouth daily.   Ozempic (2 MG/DOSE) 8 MG/3ML Sopn Generic drug:  Semaglutide (2 MG/DOSE) Inject 2 mg into the skin once a week.        Allergies:  Allergies  Allergen Reactions   Ambien [Zolpidem Tartrate] Other (See Comments)    Patient stated this medication made him "go crazy"   Metformin And Related Diarrhea    Past Medical History:  Diagnosis Date   Asthma    CAD (coronary artery disease)    a. non obst dz by cath 2004  b. 11/12/15 which showed no obvious large vessel CAD. No PCI or clear reason for NSTEMI ( possibly hypertensive urgency?).    CARDIOMYOPATHY    a. NICM, LVEF 30% 01/2011 echo; 20-25% 07/2012 echo, EF recovered by echo 06/2015.   Congestive heart failure (CHF) ([place])    DIABETES MELLITUS, TYPE II    HYPERCHOLESTEROLEMIA    HYPERTENSION    Myocardial infarct ([place]) 10/2015   Noncompliance    Prostate cancer ([place])    prostate cancer   PVCs (premature ventricular contractions)    a. s/p ablation 01-07-2013 by Dr Rayann Heman    Past Surgical History:  Procedure Laterality Date   ABLATION  01-07-2013   RVOT PVC's ablated by Dr Rayann Heman along anteroseptal RVOT   CARDIAC CATHETERIZATION  2004   nonobst dz   CARDIAC CATHETERIZATION N/A 11/12/2015   Procedure: Left Heart Cath and Coronary Angiography;  Surgeon: Sherren Mocha, MD;  Location: [facility];  Service: Cardiovascular;  Laterality: N/A;   CHOLECYSTECTOMY N/A 11/16/2013   Procedure: LAPAROSCOPIC CHOLECYSTECTOMY WITH INTRAOPERATIVE CHOLANGIOGRAM;  Surgeon: [name], MD;  [facility];  Service: General;  Laterality: N/A;   LYMPHADENECTOMY Bilateral 02/22/2014   Procedure: PELVIC LYMPH NODE DISSECTION;  Surgeon: [name], MD;  [facility]: [facility];  Service: Urology;  Laterality: Bilateral;   PROSTATE SURGERY     ROBOT ASSISTED LAPAROSCOPIC RADICAL PROSTATECTOMY N/A 02/22/2014   Procedure: ROBOTIC ASSISTED LAPAROSCOPIC RADICAL PROSTATECTOMY WITH INDOCYANINE GREEN DYE AND OPEN UMBILICAL HERNIA REPAIR;  Surgeon: [name], MD;  Location: [facility];   Service: Urology;  Laterality: N/A;   SUPRAVENTRICULAR TACHYCARDIA ABLATION N/A 09/28/2012   Procedure: Tanna Furry Ablation;  Surgeon: [name], MD;  Location: [facility] C[facility] LAB;  Service: Cardiovascular;  Laterality: N/A;   SURGERY SCROTAL / TESTICULAR     at age 41   V-TACH ABLATION N/A 01/07/2013   Procedure: V-TACH ABLATION;  Surgeon: [name], MD;  Location[facility] CATH LAB;  Service: Cardiovascular;  Laterality: N/A;    Family History  Problem Relation Age of Onset   Hypertension Mother    Peripheral  vascular disease Mother    Diabetes Mother    Colon cancer Sister    Prostate cancer Brother    Lung cancer Brother    Prostate cancer Brother    Prostate cancer Brother    Prostate cancer Brother    Pancreatic cancer Paternal Aunt    Heart failure Maternal Grandmother    Heart attack Paternal Grandfather    Diabetes Son    Peripheral vascular disease Other    Hypertension Other    Prostate cancer Other    Esophageal cancer Neg Hx    Rectal cancer Neg Hx    Stomach cancer Neg Hx    Sleep apnea Neg Hx     Social History:  reports that he has never smoked. He has never used smokeless tobacco. He reports current alcohol use. He reports that he does not use drugs.  Review of Systems:  Last diabetic eye exam date: This is overdue  Last urine microalbumin date:    Last foot exam date: 8/22  Symptoms of neuropathy: rare tingling in the feet  Hypertension:   ACE/ARB medication: Taking lisinopril  Not checking home BP  BP Readings from Last 3 Encounters:  01/08/22 128/70  10/29/21 98/60  10/28/21 120/70   He does have CKD which has not been followed by any nephrologist No microalbuminuria  Lab Results  Component Value Date   CREATININE 1.72 (H) 01/07/2022   CREATININE 1.52 (H) 08/05/2021   CREATININE 1.55 (H) 07/26/2021     Lipid management: Very well controlled with 80 mg Lipitor    Lab Results  Component Value Date   CHOL 93 07/26/2021   CHOL 154 03/12/2020    CHOL 86 (L) 10/11/2018   Lab Results  Component Value Date   HDL 31.40 (L) 07/26/2021   HDL 36.40 (L) 03/12/2020   HDL 30 (L) 10/11/2018   Lab Results  Component Value Date   LDLCALC 46 07/26/2021   LDLCALC 84 03/12/2020   LDLCALC 39 10/11/2018   Lab Results  Component Value Date   TRIG 76.0 07/26/2021   TRIG 166.0 (H) 03/12/2020   TRIG 210 (H) 02/06/2019   Lab Results  Component Value Date   CHOLHDL 3 07/26/2021   CHOLHDL 4 03/12/2020   CHOLHDL 2.9 10/11/2018   Lab Results  Component Value Date   LDLDIRECT 148.8 04/25/2009     Fibrosis 4 Score = .88       Fib-4 interpretation is not validated for people under 18 or over 67 years of age.      Examination:   BP 128/70   Pulse 96   Ht '5\' 5"'$  (1.651 m)   Wt 161 lb (73 kg)   SpO2 98%   BMI 26.79 kg/m   Body mass index is 26.79 kg/m.    ASSESSMENT/ PLAN:    Diabetes type 2:   Current regimen: Farxiga, basal insulin and Ozempic  See history of present illness for detailed discussion of current diabetes management, blood sugar patterns and problems identified  Although his A1c is again 7.1 not clear if he has any high postprandial readings He is only checking fasting readings and his meter is not programmed properly Also can benefit from exercise  Recommendations:  He will let us know when he is ready to start using the freestyle libre sensor Start monitoring half the readings after meals Given him guidelines on blood sugar targets Discussed need for regular walking program or other exercise  Hypertension: Recently better controlled  CKD: Will  need evaluation, will recommend nephrology consultation   Patient Instructions  Check blood sugars on waking up days a week  Also check blood sugars about 2 hours after meals and do this after different meals by rotation  Recommended blood sugar levels on waking up are 90-120 and about 2 hours after meal is 130-160  Please bring your blood sugar monitor  to each visit, thank you   Elayne Snare 01/08/2022, 8:54 PM

## 2022-01-08 NOTE — Patient Instructions (Signed)
Check blood sugars on waking up days a week  Also check blood sugars about 2 hours after meals and do this after different meals by rotation  Recommended blood sugar levels on waking up are 90-120 and about 2 hours after meal is 130-160  Please bring your blood sugar monitor to each visit, thank you

## 2022-01-09 ENCOUNTER — Other Ambulatory Visit: Payer: Self-pay | Admitting: Family

## 2022-01-09 DIAGNOSIS — R7989 Other specified abnormal findings of blood chemistry: Secondary | ICD-10-CM

## 2022-03-15 ENCOUNTER — Other Ambulatory Visit: Payer: Self-pay | Admitting: Endocrinology

## 2022-03-25 LAB — LAB REPORT - SCANNED
Albumin, Urine POC: <3
Albumin/Creatinine Ratio, Urine, POC: <3
Creatinine, POC: 117 mg/dL
EGFR: 55

## 2022-04-28 ENCOUNTER — Other Ambulatory Visit: Payer: Self-pay | Admitting: *Deleted

## 2022-04-28 ENCOUNTER — Telehealth: Payer: Self-pay | Admitting: *Deleted

## 2022-04-28 MED ORDER — DAPAGLIFLOZIN PROPANEDIOL 10 MG PO TABS: 10.0000 mg | ORAL_TABLET | Freq: Every day | ORAL | 3 refills | Status: DC

## 2022-04-28 NOTE — Telephone Encounter (Signed)
sent 

## 2022-04-28 NOTE — Telephone Encounter (Signed)
Pt requesting refill for farxiga--last refill by Dr. Altha Harm

## 2022-05-09 ENCOUNTER — Other Ambulatory Visit (INDEPENDENT_AMBULATORY_CARE_PROVIDER_SITE_OTHER): Payer: BC Managed Care – PPO

## 2022-05-09 DIAGNOSIS — E1165 Type 2 diabetes mellitus with hyperglycemia: Secondary | ICD-10-CM | POA: Diagnosis not present

## 2022-05-09 LAB — BASIC METABOLIC PANEL
BUN: 18 mg/dL (ref 6–23)
CO2: 27 mEq/L (ref 19–32)
Calcium: 9.4 mg/dL (ref 8.4–10.5)
Chloride: 106 mEq/L (ref 96–112)
Creatinine, Ser: 1.5 mg/dL (ref 0.40–1.50)
GFR: 47.7 mL/min — ABNORMAL LOW (ref >60.00)
Glucose, Bld: 101 mg/dL — ABNORMAL HIGH (ref 70–99)
Potassium: 3.8 mEq/L (ref 3.5–5.1)
Sodium: 141 mEq/L (ref 135–145)

## 2022-05-09 LAB — HEMOGLOBIN A1C: Hgb A1c MFr Bld: 6.6 % — ABNORMAL HIGH (ref 4.6–6.5)

## 2022-05-12 ENCOUNTER — Other Ambulatory Visit: Payer: BC Managed Care – PPO

## 2022-05-14 ENCOUNTER — Ambulatory Visit (INDEPENDENT_AMBULATORY_CARE_PROVIDER_SITE_OTHER): Payer: BC Managed Care – PPO | Admitting: Endocrinology

## 2022-05-14 ENCOUNTER — Encounter: Payer: Self-pay | Admitting: Endocrinology

## 2022-05-14 VITALS — BP 90/70 | HR 89 | Ht 65.0 in | Wt 157.0 lb

## 2022-05-14 DIAGNOSIS — E1165 Type 2 diabetes mellitus with hyperglycemia: Secondary | ICD-10-CM

## 2022-05-14 DIAGNOSIS — I952 Hypotension due to drugs: Secondary | ICD-10-CM

## 2022-05-14 DIAGNOSIS — N183 Chronic kidney disease, stage 3 unspecified: Secondary | ICD-10-CM | POA: Diagnosis not present

## 2022-05-14 DIAGNOSIS — E1122 Type 2 diabetes mellitus with diabetic chronic kidney disease: Secondary | ICD-10-CM

## 2022-05-14 NOTE — Progress Notes (Signed)
Patient ID: [name], male   DOB: 1955-10-08, 67 y.o.   MRN: [id number]           Reason for Appointment: Type II Diabetes follow-up   History of Present Illness   Diagnosis date: 2008  Previous history:  Non-insulin hypoglycemic drugs previously used: Farxiga, Actos, Ozempic Insulin was started in 2018 when his A1c was 15.6  A1c range in the last few years is: 6.7-15.6 Highest A1c was in 2018  Recent history:     Non-insulin hypoglycemic drugs: Farxiga 10 mg daily, Ozempic 2 mg weekly      Insulin regimen: Lantus 15 units daily        Side effects from medications: None  Current diabetes management, blood sugar readings and problems identified:  A1c is better at 6.6, was 7.1 Again he did not bring his monitor and has not started on the freestyle libre He says that he was called about getting the [place] sensor from the DME supplier but he did not call back As before he is only checking fasting readings and not after meals Blood sugars appear to be fairly good at home also and lab glucose was 101 fasting Has been taking his medications including Ozempic regularly No hypoglycemic symptoms during the day He is not doing any exercise despite reminders Weight is down slightly On Farxiga partly because of his CKD  Exercise: none  Diet management: Usually eating 3 meals a day     Hypoglycemia:  none    Glucometer: Freestyle lite          Blood Glucose readings from recall  Fasting 99-145  Previously: FASTING range 95-146 AVERAGE 123 for prior 30 days   Dietician visit: Most recent:   none   Weight control:  Wt Readings from Last 3 Encounters:  05/14/22 157 lb (71.2 kg)  01/08/22 161 lb (73 kg)  10/29/21 160 lb 12.8 oz (72.9 kg)            Diabetes labs:  Lab Results  Component Value Date   HGBA1C 6.6 (H) 05/09/2022   HGBA1C 7.1 (A) 01/08/2022   HGBA1C 7.1 (H) 01/07/2022   Lab Results  Component Value Date   MICROALBUR <0.7 01/07/2022    LDLCALC 46 07/26/2021   CREATININE 1.50 05/09/2022    No results found for: "FRUCTOSAMINE"   Allergies as of 05/14/2022       Reactions   Ambien [zolpidem Tartrate] Other (See Comments)   Patient stated this medication made him "go crazy"   Metformin And Related Diarrhea        Medication List        Accurate as of May 14, 2022  9:47 AM. If you have any questions, ask your nurse or doctor.          Accu-Chek Guide test strip Generic drug: glucose blood 1 each by Other route 2 (two) times daily. And lancets 2/day   amLODipine 5 MG tablet Commonly known as: NORVASC Take 1 tablet by mouth once daily   aspirin EC 81 MG tablet Take 1 tablet (81 mg total) by mouth daily.   atorvastatin 80 MG tablet Commonly known as: LIPITOR Take 1 tablet (80 mg total) by mouth daily.   carvedilol 12.5 MG tablet Commonly known as: COREG TAKE 1 TABLET BY MOUTH TWICE DAILY WITH A MEAL   clopidogrel 75 MG tablet Commonly known as: PLAVIX Take 1 tablet (75 mg total) by mouth daily.   dapagliflozin propanediol 10 MG Tabs tablet  Commonly known as: Marcelline Deist Take 1 tablet (10 mg total) by mouth daily before breakfast.   FreeStyle Libre 3 Sensor Misc Use as directed to check blood sugar   GOODY HEADACHE PO Take by mouth.   hydrochlorothiazide 25 MG tablet Commonly known as: HYDRODIURIL Take 0.5 tablets (12.5 mg total) by mouth daily.   isosorbide mononitrate 60 MG 24 hr tablet Commonly known as: IMDUR TAKE 1 & 1/2 (ONE & ONE-HALF) TABLETS BY MOUTH ONCE DAILY   Lantus SoloStar 100 UNIT/ML Solostar Pen Generic drug: insulin glargine Inject 15 Units into the skin every morning. And pen needles 1/day   lisinopril 20 MG tablet Commonly known as: ZESTRIL Take 1 tablet (20 mg total) by mouth daily.   multivitamin with minerals Tabs tablet Take 1 tablet by mouth daily.   Ozempic (2 MG/DOSE) 8 MG/3ML Sopn Generic drug: Semaglutide (2 MG/DOSE) Inject 2 mg into the skin once a  week.        Allergies:  Allergies  Allergen Reactions   Ambien [Zolpidem Tartrate] Other (See Comments)    Patient stated this medication made him "go crazy"   Metformin And Related Diarrhea    Past Medical History:  Diagnosis Date   Asthma    CAD (coronary artery disease)    a. non obst dz by cath 2004  b. 11/12/15 which showed no obvious large vessel CAD. No PCI or clear reason for NSTEMI ( possibly hypertensive urgency?).    CARDIOMYOPATHY    a. NICM, LVEF 30% 01/2011 echo; 20-25% 07/2012 echo, EF recovered by echo 06/2015.   Congestive heart failure (CHF)    DIABETES MELLITUS, TYPE II    HYPERCHOLESTEROLEMIA    HYPERTENSION    Myocardial infarct 10/2015   Noncompliance    Prostate cancer    prostate cancer   PVCs (premature ventricular contractions)    a. s/p ablation 01-07-2013 by Dr Johney Frame    Past Surgical History:  Procedure Laterality Date   ABLATION  01-07-2013   RVOT PVC's ablated by Dr Johney Frame along anteroseptal RVOT   CARDIAC CATHETERIZATION  2004   nonobst dz   CARDIAC CATHETERIZATION N/A 11/12/2015   Procedure: Left Heart Cath and Coronary Angiography;  Surgeon: Tonny Bollman, MD;  Location: [facility] INVASIVE CV LAB;  Service: Cardiovascular;  Laterality: N/A;   CHOLECYSTECTOMY N/A 11/16/2013   Procedure: LAPAROSCOPIC CHOLECYSTECTOMY WITH INTRAOPERATIVE CHOLANGIOGRAM;  Surgeon: Glenna Fellows, MD;  Location: [facility];  Service: General;  Laterality: N/A;   LYMPHADENECTOMY Bilateral 02/22/2014   Procedure: PELVIC LYMPH NODE DISSECTION;  Surgeon: Sebastian Ache, MD;  Location: [facility];  Service: Urology;  Laterality: Bilateral;   PROSTATE SURGERY     ROBOT ASSISTED LAPAROSCOPIC RADICAL PROSTATECTOMY N/A 02/22/2014   Procedure: ROBOTIC ASSISTED LAPAROSCOPIC RADICAL PROSTATECTOMY WITH INDOCYANINE GREEN DYE AND OPEN UMBILICAL HERNIA REPAIR;  Surgeon: Sebastian Ache, MD;  Location: [facility];  Service: Urology;  Laterality: N/A;   SUPRAVENTRICULAR TACHYCARDIA ABLATION N/A  09/28/2012   Procedure: Kirby Funk Ablation;  Surgeon: Hillis Range, MD;  Location: [facility] CATH LAB;  Service: Cardiovascular;  Laterality: N/A;   SURGERY SCROTAL / TESTICULAR     at age 46   V-TACH ABLATION N/A 01/07/2013   Procedure: V-TACH ABLATION;  Surgeon: Gardiner Rhyme, MD;  Location: [facility] CATH LAB;  Service: Cardiovascular;  Laterality: N/A;    Family History  Problem Relation Age of Onset   Hypertension Mother    Peripheral vascular disease Mother    Diabetes Mother    Colon cancer Sister  Prostate cancer Brother    Lung cancer Brother    Prostate cancer Brother    Prostate cancer Brother    Prostate cancer Brother    Pancreatic cancer Paternal Aunt    Heart failure Maternal Grandmother    Heart attack Paternal Grandfather    Diabetes Son    Peripheral vascular disease Other    Hypertension Other    Prostate cancer Other    Esophageal cancer Neg Hx    Rectal cancer Neg Hx    Stomach cancer Neg Hx    Sleep apnea Neg Hx     Social History:  reports that he has never smoked. He has never used smokeless tobacco. He reports current alcohol use. He reports that he does not use drugs.  Review of Systems:  Last diabetic eye exam date:  unknown  Last urine microalbumin date:    Last foot exam date: 04/2022  Symptoms of neuropathy: Sometimes tingling in the feet  Hypertension:   ACE/ARB medication: Taking lisinopril Also on HCTZ and Coreg  Not checking home BP, no complaints of lightheadedness and blood pressure is low standing up  BP Readings from Last 3 Encounters:  05/14/22 90/70  01/08/22 128/70  10/29/21 98/60   He does have CKD which has being evaluated by nephrologist, notes indicate that he was told to avoid NSAIDs and no other recommendations Recent GFR 48  No microalbuminuria  Lab Results  Component Value Date   CREATININE 1.50 05/09/2022   CREATININE 1.72 (H) 01/07/2022   CREATININE 1.52 (H) 08/05/2021     Lipid management: Very well controlled with  80 mg Lipitor    Lab Results  Component Value Date   CHOL 93 07/26/2021   CHOL 154 03/12/2020   CHOL 86 (L) 10/11/2018   Lab Results  Component Value Date   HDL 31.40 (L) 07/26/2021   HDL 36.40 (L) 03/12/2020   HDL 30 (L) 10/11/2018   Lab Results  Component Value Date   LDLCALC 46 07/26/2021   LDLCALC 84 03/12/2020   LDLCALC 39 10/11/2018   Lab Results  Component Value Date   TRIG 76.0 07/26/2021   TRIG 166.0 (H) 03/12/2020   TRIG 210 (H) 02/06/2019   Lab Results  Component Value Date   CHOLHDL 3 07/26/2021   CHOLHDL 4 03/12/2020   CHOLHDL 2.9 10/11/2018   Lab Results  Component Value Date   LDLDIRECT 148.8 04/25/2009     Fibrosis 4 Score = .88       Fib-4 interpretation is not validated for people under 35 or over 62 years of age.      Examination:   BP 90/70 (Patient Position: Standing)   Pulse 89   Ht 5\' 5"  (1.651 m)   Wt 157 lb (71.2 kg)   SpO2 97%   BMI 26.13 kg/m   Body mass index is 26.13 kg/m.   Diabetic Foot Exam - Simple   Simple Foot Form Diabetic Foot exam was performed with the following findings: Yes   Visual Inspection No deformities, no ulcerations, no other skin breakdown bilaterally: Yes Sensation Testing Intact to touch and monofilament testing bilaterally: Yes Pulse Check Posterior Tibialis and Dorsalis pulse intact bilaterally: Yes Comments     ASSESSMENT/ PLAN:    Diabetes type 2:   Current regimen: Farxiga, basal insulin/Lantus and Ozempic 2 mg weekly  See history of present illness for detailed discussion of current diabetes management, blood sugar patterns and problems identified  Now his A1c is 6.7 compared to  7.1  Likely has good readings at home after meals also because of his improved A1c with continuing 2 mg Ozempic Has not remember to check readings after meals as before   Recommendations:  He was advised on the importance of regular exercise with a walking program To check blood sugars after meals  alternating with fasting Continue Ozempic and same dose of insulin If he is able to get the freestyle libre sensor and it would be helpful in monitoring his blood sugars better and alerting him against any high or low sugars  Hypertension: Blood pressure is low especially standing up and needs to stop HCTZ temporarily  CKD: improved, to continue [place]   Patient Instructions  Check blood sugars on waking up 3 days a week  Also check blood sugars about 2 hours after meals and do this after different meals by rotation  Recommended blood sugar levels on waking up are 90-130 and about 2 hours after meal is 130-160  Please bring your blood sugar monitor to each visit, thank you  Stop HCTZ and check BP   Reather Littler 05/14/2022, 9:47 AM

## 2022-05-14 NOTE — Patient Instructions (Addendum)
Check blood sugars on waking up 3 days a week  Also check blood sugars about 2 hours after meals and do this after different meals by rotation  Recommended blood sugar levels on waking up are 90-130 and about 2 hours after meal is 130-160  Please bring your blood sugar monitor to each visit, thank you  Stop HCTZ and check BP  See PCP  Walk daily

## 2022-06-17 ENCOUNTER — Other Ambulatory Visit: Payer: Self-pay | Admitting: Family

## 2022-07-14 ENCOUNTER — Telehealth: Payer: Self-pay

## 2022-07-14 ENCOUNTER — Other Ambulatory Visit: Payer: Self-pay | Admitting: Endocrinology

## 2022-07-14 NOTE — Telephone Encounter (Signed)
Message sent to Dr. Lucianne Muss to make sure patient is suppose to take this medication.

## 2022-07-14 NOTE — Telephone Encounter (Signed)
Should patient be on  Amlodipine?

## 2022-07-15 NOTE — Telephone Encounter (Signed)
[name] is stating that he will see the nephrologist before taking the Amlodipine

## 2022-07-17 NOTE — Telephone Encounter (Signed)
Yes the pharmacy was notified on 07/15/22 as well

## 2022-07-23 ENCOUNTER — Other Ambulatory Visit: Payer: Self-pay | Admitting: Family

## 2022-07-30 ENCOUNTER — Other Ambulatory Visit: Payer: Self-pay | Admitting: Physician Assistant

## 2022-07-30 NOTE — Telephone Encounter (Signed)
Patient of Dr. Skains. Please review for refill. Thank you!  

## 2022-08-03 ENCOUNTER — Other Ambulatory Visit: Payer: Self-pay | Admitting: Endocrinology

## 2022-08-03 DIAGNOSIS — E1165 Type 2 diabetes mellitus with hyperglycemia: Secondary | ICD-10-CM

## 2022-08-08 ENCOUNTER — Other Ambulatory Visit: Payer: Self-pay | Admitting: Endocrinology

## 2022-08-13 ENCOUNTER — Encounter: Payer: Self-pay | Admitting: "Endocrinology

## 2022-08-13 ENCOUNTER — Ambulatory Visit: Payer: BC Managed Care – PPO | Admitting: "Endocrinology

## 2022-08-13 VITALS — BP 110/80 | HR 98 | Ht 65.0 in | Wt 160.6 lb

## 2022-08-13 DIAGNOSIS — Z794 Long term (current) use of insulin: Secondary | ICD-10-CM | POA: Diagnosis not present

## 2022-08-13 DIAGNOSIS — E7800 Pure hypercholesterolemia, unspecified: Secondary | ICD-10-CM

## 2022-08-13 DIAGNOSIS — Z7984 Long term (current) use of oral hypoglycemic drugs: Secondary | ICD-10-CM

## 2022-08-13 DIAGNOSIS — E1140 Type 2 diabetes mellitus with diabetic neuropathy, unspecified: Secondary | ICD-10-CM | POA: Diagnosis not present

## 2022-08-13 DIAGNOSIS — Z7985 Long-term (current) use of injectable non-insulin antidiabetic drugs: Secondary | ICD-10-CM

## 2022-08-13 LAB — COMPREHENSIVE METABOLIC PANEL
ALT: 28 U/L (ref 0–53)
AST: 25 U/L (ref 0–37)
Albumin: 3.7 g/dL (ref 3.5–5.2)
Alkaline Phosphatase: 98 U/L (ref 39–117)
BUN: 16 mg/dL (ref 6–23)
CO2: 25 mEq/L (ref 19–32)
Calcium: 9.2 mg/dL (ref 8.4–10.5)
Chloride: 110 mEq/L (ref 96–112)
Creatinine, Ser: 1.47 mg/dL (ref 0.40–1.50)
GFR: 48.78 mL/min — ABNORMAL LOW (ref >60.00)
Glucose, Bld: 103 mg/dL — ABNORMAL HIGH (ref 70–99)
Potassium: 4 mEq/L (ref 3.5–5.1)
Sodium: 144 mEq/L (ref 135–145)
Total Bilirubin: 0.5 mg/dL (ref 0.2–1.2)
Total Protein: 6.7 g/dL (ref 6.0–8.3)

## 2022-08-13 LAB — POCT GLYCOSYLATED HEMOGLOBIN (HGB A1C): Hemoglobin A1C: 5.9 % — AB (ref 4.0–5.6)

## 2022-08-13 LAB — LIPID PANEL
Cholesterol: 101 mg/dL (ref 0–200)
HDL: 34.9 mg/dL — ABNORMAL LOW (ref >39.00)
LDL Cholesterol: 49 mg/dL (ref 0–99)
NonHDL: 65.63
Total CHOL/HDL Ratio: 3
Triglycerides: 84 mg/dL (ref 0.0–149.0)
VLDL: 16.8 mg/dL (ref 0.0–40.0)

## 2022-08-13 MED ORDER — GVOKE HYPOPEN 1-PACK 1 MG/0.2ML ~~LOC~~ SOAJ
1.0000 mg | SUBCUTANEOUS | 2 refills | Status: DC | PRN
Start: 2022-08-13 — End: 2022-09-22

## 2022-08-13 NOTE — Progress Notes (Signed)
Outpatient Endocrinology Note Bradley , MD  08/13/22   [name] Apr 05, 1955 [id number]  Referring Provider: Olive Bass,* Primary Care Provider: Olive Bass, FNP Reason for consultation: Subjective   Assessment & Plan  Kimani was seen today for diabetes.  Diagnoses and all orders for this visit:  Type 2 diabetes mellitus with diabetic neuropathy, with long-term current use of insulin (HCC) -     POCT glycosylated hemoglobin (Hb A1C) -     Lipid panel; Future -     Comprehensive metabolic panel; Future -     Glucagon (GVOKE HYPOPEN 1-PACK) 1 MG/0.2ML SOAJ; Inject 1 mg into the skin as needed (low blood sugar with impaired consciousness).  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, MI  Lab Results  Component Value Date   GFR 47.70 (L) 05/09/2022   Hba1c goal less than 7, current Hba1c is  Lab Results  Component Value Date   HGBA1C 5.9 (A) 08/13/2022   Will recommend the following: Farxiga 10 mg daily Ozempic 2 mg weekly  Lantus 15 units qam     No known contraindications to any of above medications Glucagon explained and prescribed with refills 08/13/22  -Last LD and Tg are as follows: Lab Results  Component Value Date   LDLCALC 46 07/26/2021    Lab Results  Component Value Date   TRIG 76.0 07/26/2021   -Recommend atorvastatin 80 mg QD -Follow low fat diet and exercise   -Blood pressure goal <140/90 - Microalbumin/creatinine goal < 30 -Last MA/Cr is as follows: Lab Results  Component Value Date   MICROALBUR <0.7 01/07/2022   - on ACE/ARB lisinopril 20 mg qd -diet changes including salt restriction -limit eating outside -counseled BP targets per standards of diabetes care -uncontrolled blood pressure can lead to retinopathy, nephropathy and cardiovascular  and atherosclerotic heart disease  Reviewed and counseled on: -A1C target -Blood sugar targets -Complications of uncontrolled diabetes  -Checking blood sugar before meals and bedtime and bring log next visit -All medications with mechanism of action and side effects -Hypoglycemia management: rule of 15's, Glucagon Emergency Kit and medical alert ID -low-carb low-fat plate-method diet -At least 20 minutes of physical activity per day -Annual dilated retinal eye exam and foot exam -compliance and follow up needs -follow up as scheduled or earlier if problem gets worse  Call if blood sugar is less than 70 or consistently above 250    Take a 15 gm snack of carbohydrate at bedtime before you go to sleep if your blood sugar is less than 100.    If you are going to fast after midnight for a test or procedure, ask your physician for instructions on how to reduce/decrease your insulin dose.    Call if blood sugar is less than 70 or consistently above 250  -Treating a low sugar by rule of 15  (15 gms of sugar every 15 min until sugar is more than 70) If you feel your sugar is low, test your sugar to be sure If your sugar is low (less than 70), then take 15 grams of a fast acting Carbohydrate (3-4 glucose tablets or glucose gel or 4 ounces of juice or regular soda) Recheck your sugar 15 min after treating low to make sure it is more than 70 If sugar is still less than 70, treat again with 15 grams  of carbohydrate          Don't drive the hour of hypoglycemia  If unconscious/unable to eat or drink by mouth, use glucagon injection or nasal spray baqsimi and call 911. Can repeat again in 15 min if still unconscious.  Return in about 4 months (around 12/14/2022) for visit, labs today.   I have reviewed current medications, nurse's notes, allergies, vital signs, past medical and surgical history, family medical history, and social history for this encounter. Counseled patient on symptoms,  examination findings, lab findings, imaging results, treatment decisions and monitoring and prognosis. The patient understood the recommendations and agrees with the treatment plan. All questions regarding treatment plan were fully answered.  Bradley Learned, MD  08/13/22    History of Present Illness [name] is a 67 y.o. year old male who presents for evaluation of Type II diabetes mellitus.  MCCLAIN SHALL was first diagnosed in 2008.   Diabetes education +  Current regimen: Farxiga 10 mg daily Ozempic 2 mg weekly  Lantus 15 units qam     Previous history: Non-insulin hypoglycemic drugs previously used: Farxiga, Actos, Ozempic Insulin was started in 2018 when his A1c was 15.6  COMPLICATIONS +  MI/ - stroke -  retinopathy +  neuropathy +  nephropathy  BLOOD SUGAR DATA Forgot meter Checks fasting: 99-143  Physical Exam  BP 110/80   Pulse 98   Ht 5\' 5"  (1.651 m)   Wt 160 lb 9.6 oz (72.8 kg)   SpO2 98%   BMI 26.73 kg/m    Constitutional: well developed, well nourished Head: normocephalic, atraumatic Eyes: sclera anicteric, no redness Neck: supple Lungs: normal respiratory effort Neurology: alert and oriented Skin: dry, no appreciable rashes Musculoskeletal: no appreciable defects Psychiatric: normal mood and affect Diabetic Foot Exam - Simple   No data filed      Current Medications Patient's Medications  New Prescriptions   GLUCAGON (GVOKE HYPOPEN 1-PACK) 1 MG/0.2ML SOAJ    Inject 1 mg into the skin as needed (low blood sugar with impaired consciousness).  Previous Medications   AMLODIPINE (NORVASC) 5 MG TABLET    Take 1 tablet by mouth once daily   ASPIRIN EC 81 MG EC TABLET    Take 1 tablet (81 mg total) by mouth daily.   ASPIRIN-ACETAMINOPHEN-CAFFEINE (GOODY HEADACHE PO)    Take by mouth.   ATORVASTATIN (LIPITOR) 80 MG TABLET    Take 1 tablet by mouth once daily   CARVEDILOL (COREG) 12.5 MG TABLET    TAKE 1 TABLET BY MOUTH TWICE DAILY WITH  A MEAL   CLOPIDOGREL (PLAVIX) 75 MG TABLET    Take 1 tablet (75 mg total) by mouth daily.   DAPAGLIFLOZIN PROPANEDIOL (FARXIGA) 10 MG TABS TABLET    Take 1 tablet (10 mg total) by mouth daily before breakfast.   HYDROCHLOROTHIAZIDE (HYDRODIURIL) 25 MG TABLET    Take 0.5 tablets (12.5 mg total) by mouth daily.   ISOSORBIDE MONONITRATE (IMDUR) 60 MG 24 HR TABLET    TAKE 1 & 1/2 (ONE & ONE-HALF) TABLETS BY MOUTH ONCE DAILY   LANTUS SOLOSTAR 100 UNIT/ML SOLOSTAR PEN    INJECT 15 UNITS SUBCUTANEOUSLY IN THE MORNING   LISINOPRIL (ZESTRIL) 20 MG TABLET    Take 1 tablet by mouth once daily   MULTIPLE VITAMIN (MULTIVITAMIN WITH MINERALS) TABS TABLET    Take 1 tablet by mouth daily.   SEMAGLUTIDE, 2 MG/DOSE, (OZEMPIC, 2 MG/DOSE,) 8 MG/3ML SOPN    Inject 2 mg into  the skin once a week.  Modified Medications   No medications on file  Discontinued Medications   CONTINUOUS BLOOD GLUC SENSOR (FREESTYLE LIBRE 3 SENSOR) MISC    Use as directed to check blood sugar   GLUCOSE BLOOD (ACCU-CHEK GUIDE) TEST STRIP    1 each by Other route 2 (two) times daily. And lancets 2/day    Allergies Allergies  Allergen Reactions   Ambien [Zolpidem Tartrate] Other (See Comments)    Patient stated this medication made him "go crazy"   Metformin And Related Diarrhea    Past Medical History Past Medical History:  Diagnosis Date   Asthma    CAD (coronary artery disease)    a. non obst dz by cath 2004  b. 11/12/15 which showed no obvious large vessel CAD. No PCI or clear reason for NSTEMI ( possibly hypertensive urgency?).    CARDIOMYOPATHY    a. NICM, LVEF 30% 01/2011 echo; 20-25% 07/2012 echo, EF recovered by echo 06/2015.   Congestive heart failure (CHF) (HCC)    DIABETES MELLITUS, TYPE II    HYPERCHOLESTEROLEMIA    HYPERTENSION    Myocardial infarct (HCC) 10/2015   Noncompliance    Prostate cancer (HCC)    prostate cancer   PVCs (premature ventricular contractions)    a. s/p ablation 01-07-2013 by Dr Johney Frame     Past Surgical History Past Surgical History:  Procedure Laterality Date   ABLATION  01-07-2013   RVOT PVC's ablated by Dr Johney Frame along anteroseptal RVOT   CARDIAC CATHETERIZATION  2004   nonobst dz   CARDIAC CATHETERIZATION N/A 11/12/2015   Procedure: Left Heart Cath and Coronary Angiography;  Surgeon: Tonny Bollman, MD;  Location: [facility] INVASIVE CV LAB;  Service: Cardiovascular;  Laterality: N/A;   CHOLECYSTECTOMY N/A 11/16/2013   Procedure: LAPAROSCOPIC CHOLECYSTECTOMY WITH INTRAOPERATIVE CHOLANGIOGRAM;  Surgeon: Glenna Fellows, MD;  Location: [facility] OR[facility];  Service: General;  Laterality: N/A;   LYMPHADENECTOMY Bilateral 02/22/2014   Procedure: PELVIC LYMPH NODE DISSECTION;  Surgeon: Sebastian Ache, MD;  Location: [facility];  Service: Urology;  Laterality: Bilateral;   PROSTATE SURGERY     ROBOT ASSISTED LAPAROSCOPIC RADICAL PROSTATECTOMY N/A 02/22/2014   Procedure: ROBOTIC ASSISTED LAPAROSCOPIC RADICAL PROSTATECTOMY WITH INDOCYANINE GREEN DYE AND OPEN UMBILICAL HERNIA REPAIR;  Surgeon: Sebastian Ache, MD;  Location: [facility];  Service: Urology;  Laterality: N/A;   SUPRAVENTRICULAR TACHYCARDIA ABLATION N/A 09/28/2012   Procedure: Kirby Funk Ablation;  Surgeon: Hillis Range, MD;  Location: [facility] CATH LAB;  Service: Cardiovascular;  Laterality: N/A;   SURGERY SCROTAL / TESTICULAR     at age 67   V-TACH ABLATION N/A 01/07/2013   Procedure: V-TACH ABLATION;  Surgeon: Gardiner Rhyme, MD;  Location: [facility] CATH LAB;  Service: Cardiovascular;  Laterality: N/A;    Family History family history includes Colon cancer in his sister; Diabetes in his mother and son; Heart attack in his paternal grandfather; Heart failure in his maternal grandmother; Hypertension in his mother and another family member; Lung cancer in his brother; Pancreatic cancer in his paternal aunt; Peripheral vascular disease in his mother and another family member; Prostate cancer in his brother, brother, brother, brother and another family  member.  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  Vaping Use   Vaping status: Never Used  Substance and Sexual Activity   Alcohol use: Yes    Comment: rare   Drug use: No   Sexual activity: Never  Other Topics Concern   Not on file  Social History Narrative   Single - divorced x 3 -   Lives with 2 sons, enjoys spending time with 6 g-kids and 3 kids   Social Determinants of Health   Financial Resource Strain: Not on file  Food Insecurity: Not on file  Transportation Needs: Not on file  Physical Activity: Not on file  Stress: Not on file  Social Connections: Unknown (03/25/2022)   Received from [employer]   Social Network    Social Network: Not on file  Intimate Partner Violence: Unknown (03/25/2022)   Received from [facility]   HITS    Physically Hurt: Not on file    Insult or Talk Down To: Not on file    Threaten Physical Harm: Not on file    Scream or Curse: Not on file    Lab Results  Component Value Date   HGBA1C 5.9 (A) 08/13/2022   HGBA1C 6.6 (H) 05/09/2022   HGBA1C 7.1 (A) 01/08/2022   Lab Results  Component Value Date   CHOL 93 07/26/2021   Lab Results  Component Value Date   HDL 31.40 (L) 07/26/2021   Lab Results  Component Value Date   LDLCALC 46 07/26/2021   Lab Results  Component Value Date   TRIG 76.0 07/26/2021   Lab Results  Component Value Date   CHOLHDL 3 07/26/2021   Lab Results  Component Value Date   CREATININE 1.50 05/09/2022   Lab Results  Component Value Date   GFR 47.70 (L) 05/09/2022   Lab Results  Component  Value Date   MICROALBUR <0.7 01/07/2022      Component Value Date/Time   NA 141 05/09/2022 1036   NA 142 08/05/2021 1122   K 3.8 05/09/2022 1036   CL 106 05/09/2022 1036   CO2 27 05/09/2022 1036   GLUCOSE 101 (H) 05/09/2022 1036   BUN 18 05/09/2022 1036   BUN 17 08/05/2021 1122   CREATININE 1.50 05/09/2022 1036   CREATININE 1.42 (H) 12/11/2015 1520   CALCIUM 9.4 05/09/2022 1036   PROT 7.5 07/26/2021 1033   ALBUMIN 4.0 07/26/2021 1033   AST 25 07/26/2021 1033   ALT 26 07/26/2021 1033   ALKPHOS 105 07/26/2021 1033   BILITOT 0.4 07/26/2021 1033   GFRNONAA 37 (L) 04/11/2021 1144   GFRAA 37 (L) 06/24/2019 0631      Latest Ref Rng & Units 05/09/2022   10:36 AM 01/07/2022    9:33 AM 09/16/2021   10:04 AM  BMP  Glucose 70 - 99 mg/dL 696  98  295   BUN 6 - 23 mg/dL 18  27    Creatinine 2.84 - 1.50 mg/dL 1.32  4.40    Sodium 102 - 145 mEq/L 141  140    Potassium 3.5 - 5.1 mEq/L 3.8  4.2    Chloride 96 - 112 mEq/L 106  105    CO2 19 - 32 mEq/L 27  27    Calcium 8.4 - 10.5 mg/dL 9.4  72.5         Component Value  Date/Time   WBC 7.7 08/05/2021 1122   WBC 8.4 07/26/2021 1033   RBC 4.55 08/05/2021 1122   RBC 4.62 07/26/2021 1033   HGB 13.5 08/05/2021 1122   HCT 40.7 08/05/2021 1122   PLT 373 08/05/2021 1122   MCV 90 08/05/2021 1122   MCH 29.7 08/05/2021 1122   MCH 30.2 04/11/2021 1144   MCHC 33.2 08/05/2021 1122   MCHC 32.9 07/26/2021 1033   RDW 12.4 08/05/2021 1122   LYMPHSABS 2.5 07/26/2021 1033   MONOABS 0.8 07/26/2021 1033   EOSABS 0.1 07/26/2021 1033   BASOSABS 0.1 07/26/2021 1033     Parts of this note may have been dictated using voice recognition software. There may be variances in spelling and vocabulary which are unintentional. Not all errors are proofread. Please notify the Thereasa Parkin if any discrepancies are noted or if the meaning of any statement is not clear.

## 2022-08-13 NOTE — Patient Instructions (Signed)

## 2022-08-15 ENCOUNTER — Other Ambulatory Visit: Payer: Self-pay | Admitting: Physician Assistant

## 2022-08-15 ENCOUNTER — Other Ambulatory Visit: Payer: Self-pay | Admitting: Family

## 2022-08-21 ENCOUNTER — Other Ambulatory Visit: Payer: Self-pay | Admitting: Physician Assistant

## 2022-08-30 ENCOUNTER — Other Ambulatory Visit: Payer: Self-pay | Admitting: Cardiology

## 2022-08-30 ENCOUNTER — Other Ambulatory Visit: Payer: Self-pay | Admitting: Endocrinology

## 2022-08-30 DIAGNOSIS — E1165 Type 2 diabetes mellitus with hyperglycemia: Secondary | ICD-10-CM

## 2022-09-04 ENCOUNTER — Other Ambulatory Visit (HOSPITAL_COMMUNITY): Payer: Self-pay

## 2022-09-04 ENCOUNTER — Telehealth: Payer: Self-pay | Admitting: Pharmacy Technician

## 2022-09-04 NOTE — Telephone Encounter (Signed)
Pharmacy Patient Advocate Encounter   Received notification from Fax that prior authorization for Gvoke is required/requested.   Insurance verification completed.   The patient is insured through  [employer]  .   Per test claim: PA required; PA submitted to [facility] via Prompt PA Key/confirmation #/EOC [id number] Status is pending

## 2022-09-06 ENCOUNTER — Other Ambulatory Visit: Payer: Self-pay | Admitting: Endocrinology

## 2022-09-17 NOTE — Telephone Encounter (Signed)
Pharmacy Patient Advocate Encounter  Received notification from [facility] that Prior Authorization for [name] has been DENIED. Please advise how you'd like to proceed. Full denial letter will be uploaded to the media tab. See denial reason below.

## 2022-09-22 ENCOUNTER — Other Ambulatory Visit: Payer: Self-pay | Admitting: "Endocrinology

## 2022-09-22 MED ORDER — BAQSIMI ONE PACK 3 MG/DOSE NA POWD: 1.0000 | NASAL | 3 refills | Status: AC | PRN

## 2022-09-22 NOTE — Progress Notes (Signed)
Ordered Baqsimi

## 2022-09-23 ENCOUNTER — Ambulatory Visit (INDEPENDENT_AMBULATORY_CARE_PROVIDER_SITE_OTHER): Payer: BC Managed Care – PPO | Admitting: Family

## 2022-09-23 ENCOUNTER — Telehealth: Payer: Self-pay | Admitting: Family

## 2022-09-23 ENCOUNTER — Encounter: Payer: Self-pay | Admitting: Family

## 2022-09-23 VITALS — BP 160/110 | HR 88 | Resp 18 | Ht 65.0 in | Wt 161.6 lb

## 2022-09-23 DIAGNOSIS — N182 Chronic kidney disease, stage 2 (mild): Secondary | ICD-10-CM

## 2022-09-23 DIAGNOSIS — I10 Essential (primary) hypertension: Secondary | ICD-10-CM

## 2022-09-23 DIAGNOSIS — Z794 Long term (current) use of insulin: Secondary | ICD-10-CM

## 2022-09-23 DIAGNOSIS — E1169 Type 2 diabetes mellitus with other specified complication: Secondary | ICD-10-CM | POA: Diagnosis not present

## 2022-09-23 DIAGNOSIS — I2510 Atherosclerotic heart disease of native coronary artery without angina pectoris: Secondary | ICD-10-CM

## 2022-09-23 MED ORDER — AMLODIPINE BESYLATE 5 MG PO TABS: 5.0000 mg | ORAL_TABLET | Freq: Every day | ORAL | 0 refills | Status: DC

## 2022-09-23 MED ORDER — CLOPIDOGREL BISULFATE 75 MG PO TABS: 75.0000 mg | ORAL_TABLET | Freq: Every day | ORAL | 0 refills | Status: AC

## 2022-09-23 MED ORDER — HYDROCHLOROTHIAZIDE 25 MG PO TABS: 25.0000 mg | ORAL_TABLET | Freq: Every day | ORAL | 0 refills | Status: DC

## 2022-09-23 MED ORDER — ATORVASTATIN CALCIUM 80 MG PO TABS: 80.0000 mg | ORAL_TABLET | Freq: Every day | ORAL | 3 refills | Status: DC

## 2022-09-23 MED ORDER — CARVEDILOL 12.5 MG PO TABS: 12.5000 mg | ORAL_TABLET | Freq: Two times a day (BID) | ORAL | 3 refills | Status: AC

## 2022-09-23 NOTE — Progress Notes (Signed)
[name] is a 67 y.o. male with the following history as recorded in EpicCare:  Patient Active Problem List   Diagnosis Date Noted   Syncope, vasovagal 04/21/2020   Syncope 04/21/2020   COVID-19 virus infection 02/07/2019   Diarrhea 02/07/2019   Acute on chronic renal failure (HCC) 02/06/2019   Routine general medical examination at a health care facility 03/12/2018   Balanitis 10/15/2016   NSTEMI (non-ST elevated myocardial infarction) (HCC) 11/13/2015   PVCs (premature ventricular contractions)    CAD (coronary artery disease)    Acute kidney injury (HCC) 07/19/2015   AKI (acute kidney injury) (HCC) 07/19/2015   Hyperglycemia 07/12/2015   Prostate cancer (HCC) 02/22/2014   Cholelithiasis and cholecystitis without obstruction 11/16/2013   PVC's (premature ventricular contractions) 01/07/2013   CKD (chronic kidney disease) stage 2, GFR 60-89 ml/min 08/25/2012   OSA (obstructive sleep apnea) 08/25/2012   Chronic systolic heart failure (HCC) 08/06/2012   HTN (hypertension), malignant 02/12/2011   Non compliance w medication regimen 02/12/2011   HYPERCHOLESTEROLEMIA 09/17/2009   PREMATURE VENTRICULAR CONTRACTIONS 09/17/2009   PSA, INCREASED 04/26/2009   Type 2 diabetes mellitus (HCC) 04/25/2009   CARDIOMYOPATHY 04/25/2009    Current Outpatient Medications  Medication Sig Dispense Refill   aspirin EC 81 MG EC tablet Take 1 tablet (81 mg total) by mouth daily.     dapagliflozin propanediol (FARXIGA) 10 MG TABS tablet Take 1 tablet (10 mg total) by mouth daily before breakfast. 90 tablet 3   isosorbide mononitrate (IMDUR) 60 MG 24 hr tablet TAKE 1 & 1/2 (ONE & ONE-HALF) TABLETS BY MOUTH ONCE DAILY 45 tablet 0   LANTUS SOLOSTAR 100 UNIT/ML Solostar Pen INJECT 15 UNITS SUBCUTANEOUSLY IN THE MORNING 15 mL 0   lisinopril (ZESTRIL) 20 MG tablet Take 1 tablet by mouth once daily 30 tablet 3   Multiple Vitamin (MULTIVITAMIN WITH MINERALS) TABS tablet Take 1 tablet by mouth daily.      Semaglutide, 2 MG/DOSE, (OZEMPIC, 2 MG/DOSE,) 8 MG/3ML SOPN INJECT 2 MG INTO THE SKIN ONCE A WEEK 9 mL 0   amLODipine (NORVASC) 5 MG tablet Take 1 tablet (5 mg total) by mouth daily. 90 tablet 0   atorvastatin (LIPITOR) 80 MG tablet Take 1 tablet (80 mg total) by mouth daily. 90 tablet 3   carvedilol (COREG) 12.5 MG tablet Take 1 tablet (12.5 mg total) by mouth 2 (two) times daily with a meal. 180 tablet 3   clopidogrel (PLAVIX) 75 MG tablet Take 1 tablet (75 mg total) by mouth daily. 90 tablet 0   Glucagon (BAQSIMI ONE PACK) 3 MG/DOSE POWD Place 1 Device into the nose as needed (Low blood sugar with impaired consciousness). (Patient not taking: Reported on 09/23/2022) 2 each 3   hydrochlorothiazide (HYDRODIURIL) 25 MG tablet Take 1 tablet (25 mg total) by mouth daily. Take 1/2 (one-half) tablet by mouth once daily 45 tablet 0   No current facility-administered medications for this visit.    Allergies: Ambien [zolpidem tartrate] and Metformin and related  Past Medical History:  Diagnosis Date   Asthma    CAD (coronary artery disease)    a. non obst dz by cath 2004  b. 11/12/15 which showed no obvious large vessel CAD. No PCI or clear reason for NSTEMI ( possibly hypertensive urgency?).    CARDIOMYOPATHY    a. NICM, LVEF 30% 01/2011 echo; 20-25% 07/2012 echo, EF recovered by echo 06/2015.   Congestive heart failure (CHF) (HCC)    DIABETES MELLITUS, TYPE II  HYPERCHOLESTEROLEMIA    HYPERTENSION    Myocardial infarct (HCC) 10/2015   Noncompliance    Prostate cancer (HCC)    prostate cancer   PVCs (premature ventricular contractions)    a. s/p ablation 01-07-2013 by Dr Johney Frame    Past Surgical History:  Procedure Laterality Date   ABLATION  01-07-2013   RVOT PVC's ablated by Dr Johney Frame along anteroseptal RVOT   CARDIAC CATHETERIZATION  2004   nonobst dz   CARDIAC CATHETERIZATION N/A 11/12/2015   Procedure: Left Heart Cath and Coronary Angiography;  Surgeon: Tonny Bollman, MD;   Location: [facility] INVASIVE CV LAB;  Service: Cardiovascular;  Laterality: N/A;   CHOLECYSTECTOMY N/A 11/16/2013   Procedure: LAPAROSCOPIC CHOLECYSTECTOMY WITH INTRAOPERATIVE CHOLANGIOGRAM;  Surgeon: Glenna Fellows, MD;  Location: [facility];  Service: General;  Laterality: N/A;   LYMPHADENECTOMY Bilateral 02/22/2014   Procedure: PELVIC LYMPH NODE DISSECTION;  Surgeon: Sebastian Ache, MD;  Location: [facility];  Service: Urology;  Laterality: Bilateral;   PROSTATE SURGERY     ROBOT ASSISTED LAPAROSCOPIC RADICAL PROSTATECTOMY N/A 02/22/2014   Procedure: ROBOTIC ASSISTED LAPAROSCOPIC RADICAL PROSTATECTOMY WITH INDOCYANINE GREEN DYE AND OPEN UMBILICAL HERNIA REPAIR;  Surgeon: Sebastian Ache, MD;  Location: [facility];  Service: Urology;  Laterality: N/A;   SUPRAVENTRICULAR TACHYCARDIA ABLATION N/A 09/28/2012   Procedure: Kirby Funk Ablation;  Surgeon: Hillis Range, MD;  Location: [facility] CATH LAB;  Service: Cardiovascular;  Laterality: N/A;   SURGERY SCROTAL / TESTICULAR     at age 86   V-TACH ABLATION N/A 01/07/2013   Procedure: V-TACH ABLATION;  Surgeon: Gardiner Rhyme, MD;  Location: [facility] CATH LAB;  Service: Cardiovascular;  Laterality: N/A;    Family History  Problem Relation Age of Onset   Hypertension Mother    Peripheral vascular disease Mother    Diabetes Mother    Colon cancer Sister    Prostate cancer Brother    Lung cancer Brother    Prostate cancer Brother    Prostate cancer Brother    Prostate cancer Brother    Pancreatic cancer Paternal Aunt    Heart failure Maternal Grandmother    Heart attack Paternal Grandfather    Diabetes Son    Peripheral vascular disease Other    Hypertension Other    Prostate cancer Other    Esophageal cancer Neg Hx    Rectal cancer Neg Hx    Stomach cancer Neg Hx    Sleep apnea Neg Hx     Social History   Tobacco Use   Smoking status: Never   Smokeless tobacco: Never   Tobacco comments:    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  Head: Normocephalic and atraumatic  Eyes: Sclera and conjunctiva clear; pupils round and reactive to light; extraocular movements intact  Ears: External normal; canals clear; tympanic membranes normal  Oropharynx: Pink, supple. No suspicious lesions  Neck: Supple without thyromegaly, adenopathy  Lungs: Respirations unlabored; clear to auscultation bilaterally without wheeze, rales, rhonchi  CVS exam: normal rate and regular rhythm.  Abdomen: Soft; nontender; nondistended; normoactive bowel sounds; no masses or hepatosplenomegaly  Musculoskeletal: No deformities; no active joint inflammation  Extremities: No edema, cyanosis, clubbing  Vessels: Symmetric bilaterally  Neurologic: Alert and oriented; speech intact; face symmetrical; moves all extremities well; CNII-XII intact without focal deficit   Assessment:  1. HTN (hypertension), malignant   2. CKD (chronic kidney disease) stage 2, GFR 60-89 ml/min   3. Type 2 diabetes mellitus with other specified complication, with long-term current use of insulin (HCC)   4. Coronary artery disease involving native coronary artery of native heart without angina pectoris     Plan:  Refills updated- reviewed notes and it does look like he is supposed to be on his Amlodipine 5 mg; refill updated and stressed need to take daily;  Continue to see nephrologist regularly; plan to see  his cardiologist in October 2024;  Scheduled to see his endocrinologist in November 2024;   Follow up in 1 year, sooner prn.   No follow-ups on file.  No orders of the defined types were placed in this encounter.   Requested Prescriptions   Signed Prescriptions Disp Refills   atorvastatin (LIPITOR) 80 MG tablet 90 tablet 3    Sig: Take 1 tablet (80 mg total) by mouth daily.   carvedilol (COREG) 12.5 MG tablet 180 tablet 3    Sig: Take 1 tablet (12.5 mg total) by mouth 2 (two) times daily with a meal.   clopidogrel (PLAVIX) 75 MG tablet 90 tablet 0    Sig: Take 1 tablet (75 mg total) by mouth daily.   hydrochlorothiazide (HYDRODIURIL) 25 MG tablet 45 tablet 0    Sig: Take 1 tablet (25 mg total) by mouth daily. Take 1/2 (one-half) tablet by mouth once daily   amLODipine (NORVASC) 5 MG tablet 90 tablet 0    Sig: Take 1 tablet (5 mg total) by mouth daily.

## 2022-09-23 NOTE — Patient Instructions (Addendum)
You will still need to see Dr. Anne Fu in October as his office previously recommended; we will do short term refills until you are able to see him.   Re-start the Amlodipine 5 mg along with the other medications;  Please get the flu shot and Shingrix in September/ October;

## 2022-09-23 NOTE — Telephone Encounter (Signed)
Kayla from [facility] pharmacy called to get clarification on if the medication hydrochlorothiazide (HYDRODIURIL) 25 MG tablet [[id number]], is supposed to be taken half a tablet/day or one tablet/day. Please call & advise with [facility].

## 2022-09-23 NOTE — Telephone Encounter (Signed)
Spoke with Rayfield Citizen from [facility] to confirm Rx.

## 2022-09-30 ENCOUNTER — Other Ambulatory Visit: Payer: Self-pay

## 2022-09-30 MED ORDER — ISOSORBIDE MONONITRATE ER 60 MG PO TB24: ORAL_TABLET | ORAL | 1 refills | Status: DC

## 2022-11-18 ENCOUNTER — Other Ambulatory Visit: Payer: Self-pay

## 2022-11-18 DIAGNOSIS — E1165 Type 2 diabetes mellitus with hyperglycemia: Secondary | ICD-10-CM

## 2022-11-18 MED ORDER — LANTUS SOLOSTAR 100 UNIT/ML ~~LOC~~ SOPN: PEN_INJECTOR | SUBCUTANEOUS | 0 refills | Status: DC

## 2022-11-27 ENCOUNTER — Other Ambulatory Visit: Payer: Self-pay

## 2022-12-01 ENCOUNTER — Other Ambulatory Visit: Payer: Self-pay | Admitting: "Endocrinology

## 2022-12-01 DIAGNOSIS — E1165 Type 2 diabetes mellitus with hyperglycemia: Secondary | ICD-10-CM

## 2022-12-01 NOTE — Progress Notes (Unsigned)
Cardiology Office Note:  .   Date:  12/04/2022  ID:  [name], DOB Jun 17, 1955, MRN [id number] PCP: Olive Bass, FNP  [facility] Providers Cardiologist:  Donato Schultz, MD   History of Present Illness: .   [name] is a 67 y.o. male with hx of coronary artery disease, non-ischemic cardiomyopathy, and hypertension. Non-obstructive coronary artery disease per heart catheterization 10/2015.  Hx of COVID 2020   He comes today without any cardiac complaints.  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  He denies any chest pain shortness of breath dizziness palpitations or excessive fatigue.  He is medically compliant.  ROS: As above otherwise negative.  Studies Reviewed: .    Echo  08/20/2021:  1. Left ventricular ejection fraction, by estimation, is 50 to 55%. The  left ventricle has low normal function. The left ventricle has no regional  wall motion abnormalities. There is severe concentric left ventricular  hypertrophy. Left ventricular  diastolic parameters are consistent with Grade I diastolic dysfunction  (impaired relaxation).   2. Right ventricular systolic function is normal. The right ventricular  size is normal. Tricuspid regurgitation signal is inadequate for assessing  PA pressure.   3. The mitral valve is normal in structure. No evidence of mitral valve  regurgitation. No evidence of mitral stenosis.   4. The aortic valve is tricuspid. Aortic valve regurgitation is not  visualized. No aortic stenosis is present.   Comparison(s): No significant change from prior study.    Nuclear Stress Test  07/04/2020: The left ventricular ejection fraction is moderately decreased (30-44%). Nuclear stress EF: 42%. There was no ST segment deviation noted during stress. No T wave inversion was noted during stress. There is normal myocardial perfusion without evidence of ischemia or infarct. This is an intermediate risk study due to moderately  reduced LVEF. Results are similar to prior stress test in 2020.   EKG Interpretation Date/Time:  Thursday December 04 2022 13:31:31 EST Ventricular Rate:  78 PR Interval:  214 QRS Duration:  126 QT Interval:  390 QTC Calculation: 444 R Axis:   -53  Text Interpretation: Sinus rhythm with 1st degree A-V block Left axis deviation Non-specific intra-ventricular conduction block Minimal voltage criteria for LVH, may be normal variant ( Cornell product ) When compared with ECG of 11-Apr-2021 11:51, PREVIOUS ECG IS PRESENT Confirmed by Joni Reining [phone]) on 12/04/2022 1:47:01 PM    Physical Exam:   VS:  BP 136/82 (BP Location: Left Arm, Patient Position: Sitting, Cuff Size: Normal)   Pulse 75   Resp 16   Ht 5\' 5"  (1.651 m)   Wt 160 lb (72.6 kg)   SpO2 95%   BMI 26.63 kg/m    Wt Readings from Last 3 Encounters:  12/04/22 160 lb (72.6 kg)  09/23/22 161 lb 9.6 oz (73.3 kg)  08/13/22 160 lb 9.6 oz (72.8 kg)    GEN: Well nourished, well developed in no acute distress NECK: No JVD; No carotid bruits CARDIAC: RRR, no murmurs, rubs, gallops RESPIRATORY:  Clear to auscultation without rales, wheezing or rhonchi  ABDOMEN: Soft, non-tender, non-distended EXTREMITIES:  No edema; No deformity   ASSESSMENT AND PLAN: .    Nonobstructive CAD: Cardiac catheterization 11/12/2015 mid RCA 30% stenosis mid LAD 25% stenosis first diagonal lesion 30% stenosed.  Continue secondary management with blood pressure control, lipid management, purposeful exercise, and Mediterranean diet.  He denies any symptoms at this time.  Continue current medication regimen.  It is noted that the patient is on clopidogrel I am uncertain why this is continued and would recommend discontinuing unless other nondocumented reasons for medication regimen.  2.  Chronic systolic heart failure resolved: Patient is euvolemic.  Most recent echocardiogram revealed normal LV systolic function with a EF of 50 to 55%.  Continue blood  pressure control.  3.  Hypertension: Blood pressure is very well-controlled on current medication regimen.  Checking labs for kidney function.  4.  Hypercholesterolemia: Goal of LDL less than 70 with known CAD per cath in 2017.  Will be checking fasting lipids and LFTs.         Signed, Bettey Mare. Liborio Nixon, ANP, AACC

## 2022-12-02 ENCOUNTER — Other Ambulatory Visit: Payer: Self-pay

## 2022-12-02 MED ORDER — ISOSORBIDE MONONITRATE ER 60 MG PO TB24: ORAL_TABLET | ORAL | 0 refills | Status: DC

## 2022-12-04 ENCOUNTER — Encounter: Payer: Self-pay | Admitting: Adult Health

## 2022-12-04 ENCOUNTER — Ambulatory Visit: Payer: BC Managed Care – PPO | Attending: Adult Health | Admitting: Adult Health

## 2022-12-04 VITALS — BP 136/82 | HR 75 | Resp 16 | Ht 65.0 in | Wt 160.0 lb

## 2022-12-04 DIAGNOSIS — I10 Essential (primary) hypertension: Secondary | ICD-10-CM | POA: Diagnosis not present

## 2022-12-04 NOTE — Patient Instructions (Signed)
Medication Instructions:  No changes *If you need a refill on your cardiac medications before your next appointment, please call your pharmacy*   Lab Work: CMET, CBC, Lipid Panel If you have labs (blood work) drawn today and your tests are completely normal, you will receive your results only by: MyChart Message (if you have MyChart) OR A paper copy in the mail If you have any lab test that is abnormal or we need to change your treatment, we will call you to review the results.   Testing/Procedures: No Testing   Follow-Up: At [facility], you and your health needs are our priority.  As part of our continuing mission to provide you with exceptional heart care, we have created designated Provider Care Teams.  These Care Teams include your primary Cardiologist (physician) and Advanced Practice Providers (APPs -  Physician Assistants and Nurse Practitioners) who all work together to provide you with the care you need, when you need it.  We recommend signing up for the patient portal called "MyChart".  Sign up information is provided on this After Visit Summary.  MyChart is used to connect with patients for Virtual Visits (Telemedicine).  Patients are able to view lab/test results, encounter notes, upcoming appointments, etc.  Non-urgent messages can be sent to your provider as well.   To learn more about what you can do with MyChart, go to [email or web address].    Your next appointment:   1 year(s)  Provider:   Donato Schultz, MD

## 2022-12-11 ENCOUNTER — Other Ambulatory Visit: Payer: Self-pay | Admitting: Family

## 2022-12-16 ENCOUNTER — Encounter: Payer: Self-pay | Admitting: "Endocrinology

## 2022-12-16 ENCOUNTER — Ambulatory Visit (INDEPENDENT_AMBULATORY_CARE_PROVIDER_SITE_OTHER): Payer: BC Managed Care – PPO | Admitting: "Endocrinology

## 2022-12-16 VITALS — BP 120/90 | HR 92 | Ht 65.0 in | Wt 160.2 lb

## 2022-12-16 DIAGNOSIS — E1140 Type 2 diabetes mellitus with diabetic neuropathy, unspecified: Secondary | ICD-10-CM | POA: Diagnosis not present

## 2022-12-16 DIAGNOSIS — Z794 Long term (current) use of insulin: Secondary | ICD-10-CM | POA: Diagnosis not present

## 2022-12-16 DIAGNOSIS — E7800 Pure hypercholesterolemia, unspecified: Secondary | ICD-10-CM

## 2022-12-16 DIAGNOSIS — Z7985 Long-term (current) use of injectable non-insulin antidiabetic drugs: Secondary | ICD-10-CM | POA: Diagnosis not present

## 2022-12-16 DIAGNOSIS — Z7984 Long term (current) use of oral hypoglycemic drugs: Secondary | ICD-10-CM

## 2022-12-16 LAB — POCT GLYCOSYLATED HEMOGLOBIN (HGB A1C): Hemoglobin A1C: 6.4 % — AB (ref 4.0–5.6)

## 2022-12-16 LAB — MICROALBUMIN / CREATININE URINE RATIO
Creatinine,U: 129.8 mg/dL
Microalb Creat Ratio: 0.6 mg/g (ref 0.0–30.0)
Microalb, Ur: 0.8 mg/dL (ref 0.0–1.9)

## 2022-12-16 NOTE — Progress Notes (Signed)
Outpatient Endocrinology Note Bradley [place], MD  12/16/22   [name] 1955/04/27 [id number]  Referring Provider: Olive Bass,* Primary Care Provider: Olive Bass, FNP Reason for consultation: Subjective   Assessment & Plan  Diagnoses and all orders for this visit:  Type 2 diabetes mellitus with diabetic neuropathy, with long-term current use of insulin (HCC) -     POCT glycosylated hemoglobin (Hb A1C) -     Microalbumin / creatinine urine ratio; Future  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, MI  Lab Results  Component Value Date   GFR 48.78 (L) 08/13/2022   Hba1c goal less than 7, current Hba1c is  Lab Results  Component Value Date   HGBA1C 6.4 (A) 12/16/2022   Will recommend the following: Farxiga 10 mg daily Ozempic 2 mg weekly  Lantus 15 units qam     No known contraindications to any of above medications Glucagon explained and prescribed with refills 08/13/22  -Last LD and Tg are as follows: Lab Results  Component Value Date   LDLCALC 49 08/13/2022    Lab Results  Component Value Date   TRIG 84.0 08/13/2022   -Recommend atorvastatin 80 mg QD -Follow low fat diet and exercise   -Blood pressure goal <140/90 - Microalbumin/creatinine goal < 30 -Last MA/Cr is as follows: Lab Results  Component Value Date   MICROALBUR <0.7 01/07/2022   - on ACE/ARB lisinopril 20 mg qd -diet changes including salt restriction -limit eating outside -counseled BP targets per standards of diabetes care -uncontrolled blood pressure can lead to retinopathy, nephropathy and cardiovascular and atherosclerotic heart disease  Reviewed and counseled on: -A1C target -Blood sugar targets -Complications of uncontrolled diabetes  -Checking blood sugar before meals and bedtime and bring  log next visit -All medications with mechanism of action and side effects -Hypoglycemia management: rule of 15's, Glucagon Emergency Kit and medical alert ID -low-carb low-fat plate-method diet -At least 20 minutes of physical activity per day -Annual dilated retinal eye exam and foot exam -compliance and follow up needs -follow up as scheduled or earlier if problem gets worse  Call if blood sugar is less than 70 or consistently above 250    Take a 15 gm snack of carbohydrate at bedtime before you go to sleep if your blood sugar is less than 100.    If you are going to fast after midnight for a test or procedure, ask your physician for instructions on how to reduce/decrease your insulin dose.    Call if blood sugar is less than 70 or consistently above 250  -Treating a low sugar by rule of 15  (15 gms of sugar every 15 min until sugar is more than 70) If you feel your sugar is low, test your sugar to be sure If your sugar is low (less than 70), then take 15 grams of a fast acting Carbohydrate (3-4 glucose tablets or glucose gel or 4 ounces of juice or regular soda) Recheck your sugar 15 min after treating low to make sure it is more than 70 If sugar is still less than 70, treat again with 15 grams of carbohydrate          Don't drive the hour of hypoglycemia  If unconscious/unable to eat or drink by mouth, use glucagon injection or nasal spray baqsimi and call 911. Can repeat again  in 15 min if still unconscious.  Return in about 3 months (around 03/18/2023) for visit, lab today.   I have reviewed current medications, nurse's notes, allergies, vital signs, past medical and surgical history, family medical history, and social history for this encounter. Counseled patient on symptoms, examination findings, lab findings, imaging results, treatment decisions and monitoring and prognosis. The patient understood the recommendations and agrees with the treatment plan. All questions regarding  treatment plan were fully answered.  Bradley [place], MD  12/16/22    History of Present Illness [name] is a 67 y.o. year old male who presents for follow up of Type II diabetes mellitus.  [name] was first diagnosed in 2008.   Diabetes education +  Current regimen: Farxiga 10 mg daily Ozempic 2 mg weekly  Lantus 15 units qam     Previous history: Non-insulin hypoglycemic drugs previously used: Farxiga, Actos, Ozempic Insulin was started in 2018 when his A1c was 15.6  COMPLICATIONS +  MI/ - stroke -  retinopathy +  neuropathy +  nephropathy  BLOOD SUGAR DATA Forgot meter Checks fasting: 98-160  Physical Exam  BP (!) 120/90   Pulse 92   Ht 5\' 5"  (1.651 m)   Wt 160 lb 3.2 oz (72.7 kg)   SpO2 98%   BMI 26.66 kg/m    Constitutional: well developed, well nourished Head: normocephalic, atraumatic Eyes: sclera anicteric, no redness Neck: supple Lungs: normal respiratory effort Neurology: alert and oriented Skin: dry, no appreciable rashes Musculoskeletal: no appreciable defects Psychiatric: normal mood and affect Diabetic Foot Exam - Simple   No data filed      Current Medications Patient's Medications  New Prescriptions   No medications on file  Previous Medications   AMLODIPINE (NORVASC) 5 MG TABLET    Take 1 tablet (5 mg total) by mouth daily.   ASPIRIN EC 81 MG EC TABLET    Take 1 tablet (81 mg total) by mouth daily.   ATORVASTATIN (LIPITOR) 80 MG TABLET    Take 1 tablet (80 mg total) by mouth daily.   CARVEDILOL (COREG) 12.5 MG TABLET    Take 1 tablet (12.5 mg total) by mouth 2 (two) times daily with a meal.   CLOPIDOGREL (PLAVIX) 75 MG TABLET    Take 1 tablet (75 mg total) by mouth daily.   DAPAGLIFLOZIN PROPANEDIOL (FARXIGA) 10 MG TABS TABLET    Take 1 tablet (10 mg total) by mouth daily before breakfast.   GLUCAGON (BAQSIMI ONE PACK) 3 MG/DOSE POWD    Place 1 Device into the nose as needed (Low blood sugar with impaired  consciousness).   HYDROCHLOROTHIAZIDE (HYDRODIURIL) 25 MG TABLET    Take 1 tablet (25 mg total) by mouth daily. Take 1/2 (one-half) tablet by mouth once daily   ISOSORBIDE MONONITRATE (IMDUR) 60 MG 24 HR TABLET    TAKE 1 & 1/2 (ONE & ONE-HALF) TABLETS BY MOUTH ONCE DAILY .   LANTUS SOLOSTAR 100 UNIT/ML SOLOSTAR PEN    Inject 15 units subcutaneously in the morning   LISINOPRIL (ZESTRIL) 20 MG TABLET    Take 1 tablet by mouth once daily   MULTIPLE VITAMIN (MULTIVITAMIN WITH MINERALS) TABS TABLET    Take 1 tablet by mouth daily.   OZEMPIC, 2 MG/DOSE, 8 MG/3ML SOPN    INJECT 2 MG INTO THE SKIN ONCE A WEEK  Modified Medications   No medications on file  Discontinued Medications   No medications on file    Allergies Allergies  Allergen Reactions   Ambien [Zolpidem Tartrate] Other (See Comments)    Patient stated this medication made him "go crazy"   Metformin And Related Diarrhea    Past Medical History Past Medical History:  Diagnosis Date   Asthma    CAD (coronary artery disease)    a. non obst dz by cath 2004  b. 11/12/15 which showed no obvious large vessel CAD. No PCI or clear reason for NSTEMI ( possibly hypertensive urgency?).    CARDIOMYOPATHY    a. NICM, LVEF 30% 01/2011 echo; 20-25% 07/2012 echo, EF recovered by echo 06/2015.   Congestive heart failure (CHF) (HCC)    DIABETES MELLITUS, TYPE II    HYPERCHOLESTEROLEMIA    HYPERTENSION    Myocardial infarct (HCC) 10/2015   Noncompliance    Prostate cancer (HCC)    prostate cancer   PVCs (premature ventricular contractions)    a. s/p ablation 01-07-2013 by Dr Johney Frame    Past Surgical History Past Surgical History:  Procedure Laterality Date   ABLATION  01-07-2013   RVOT PVC's ablated by Dr Johney Frame along anteroseptal RVOT   CARDIAC CATHETERIZATION  2004   nonobst dz   CARDIAC CATHETERIZATION N/A 11/12/2015   Procedure: Left Heart Cath and Coronary Angiography;  Surgeon: Tonny Bollman, MD;  Location: [facility] INVASIVE CV LAB;   Service: Cardiovascular;  Laterality: N/A;   CHOLECYSTECTOMY N/A 11/16/2013   Procedure: LAPAROSCOPIC CHOLECYSTECTOMY WITH INTRAOPERATIVE CHOLANGIOGRAM;  Surgeon: Glenna Fellows, MD;  Location: [facility] OR[facility];  Service: General;  Laterality: N/A;   LYMPHADENECTOMY Bilateral 02/22/2014   Procedure: PELVIC LYMPH NODE DISSECTION;  Surgeon: Sebastian Ache, MD;  Location: [facility];  Service: Urology;  Laterality: Bilateral;   PROSTATE SURGERY     ROBOT ASSISTED LAPAROSCOPIC RADICAL PROSTATECTOMY N/A 02/22/2014   Procedure: ROBOTIC ASSISTED LAPAROSCOPIC RADICAL PROSTATECTOMY WITH INDOCYANINE GREEN DYE AND OPEN UMBILICAL HERNIA REPAIR;  Surgeon: Sebastian Ache, MD;  Location: [facility];  Service: Urology;  Laterality: N/A;   SUPRAVENTRICULAR TACHYCARDIA ABLATION N/A 09/28/2012   Procedure: Kirby Funk Ablation;  Surgeon: Hillis Range, MD;  Location: [facility] CATH LAB;  Service: Cardiovascular;  Laterality: N/A;   SURGERY SCROTAL / TESTICULAR     at age 84   V-TACH ABLATION N/A 01/07/2013   Procedure: V-TACH ABLATION;  Surgeon: Gardiner Rhyme, MD;  Location: [facility] CATH LAB;  Service: Cardiovascular;  Laterality: N/A;    Family History family history includes Colon cancer in his sister; Diabetes in his mother and son; Heart attack in his paternal grandfather; Heart failure in his maternal grandmother; Hypertension in his mother and another family member; Lung cancer in his brother; Pancreatic cancer in his paternal aunt; Peripheral vascular disease in his mother and another family member; Prostate cancer in his brother, brother, brother, brother and another family member.  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  Vaping Use   Vaping status: Never Used  Substance and Sexual Activity   Alcohol use: Yes    Comment: rare   Drug use: No   Sexual activity: Never  Other Topics Concern   Not on file  Social History Narrative   Single - divorced x 3 -   Lives with 2 sons, enjoys spending time with 6 g-kids and 3 kids   Social Determinants of Health   Financial Resource Strain: Not on file  Food Insecurity: Not on file  Transportation Needs: Not on file  Physical Activity: Not on file  Stress: Not on file  Social Connections: Unknown (03/25/2022)   Received from [facility], [facility]   Social Network    Social Network: Not on file  Intimate Partner Violence: Unknown (03/25/2022)   Received from [facility], [facility]   HITS    Physically Hurt: Not on file    Insult or Talk Down To: Not on file    Threaten Physical Harm: Not on file    Scream or Curse: Not on file    Lab Results  Component Value Date   HGBA1C 6.4 (A) 12/16/2022   HGBA1C 5.9 (A) 08/13/2022   HGBA1C 6.6 (H) 05/09/2022   Lab Results  Component Value Date   CHOL 101 08/13/2022   Lab Results  Component Value Date   HDL 34.90 (L) 08/13/2022   Lab Results  Component Value Date   LDLCALC 49 08/13/2022   Lab Results  Component Value Date   TRIG 84.0 08/13/2022   Lab Results  Component Value Date   CHOLHDL 3 08/13/2022   Lab Results  Component Value Date   CREATININE 1.47 08/13/2022   Lab Results  Component Value Date   GFR 48.78 (L) 08/13/2022   Lab Results  Component Value Date   MICROALBUR <0.7 01/07/2022      Component Value Date/Time   NA 144 08/13/2022 1032   NA 142 08/05/2021 1122   K 4.0 08/13/2022 1032   CL 110 08/13/2022 1032   CO2 25 08/13/2022 1032   GLUCOSE 103 (H) 08/13/2022 1032   BUN 16 08/13/2022 1032   BUN 17 08/05/2021 1122   CREATININE 1.47 08/13/2022 1032   CREATININE  1.42 (H) 12/11/2015 1520   CALCIUM 9.2 08/13/2022 1032   PROT 6.7 08/13/2022 1032   ALBUMIN 3.7 08/13/2022 1032   AST 25 08/13/2022 1032   ALT 28 08/13/2022 1032   ALKPHOS 98 08/13/2022 1032   BILITOT 0.5 08/13/2022 1032   GFRNONAA 37 (L) 04/11/2021 1144   GFRAA 37 (L) 06/24/2019 0631      Latest Ref Rng & Units 08/13/2022   10:32 AM 05/09/2022   10:36 AM 01/07/2022    9:33 AM  BMP  Glucose 70 - 99 mg/dL 130  865  98   BUN 6 - 23 mg/dL 16  18  27    Creatinine 0.40 - 1.50 mg/dL 7.84  6.96  2.95   Sodium 135 - 145 mEq/L 144  141  140   Potassium 3.5 - 5.1 mEq/L 4.0  3.8  4.2   Chloride 96 - 112 mEq/L 110  106  105   CO2 19 - 32 mEq/L 25  27  27    Calcium 8.4 - 10.5 mg/dL 9.2  9.4  28.4        Component Value Date/Time   WBC 7.7 08/05/2021 1122   WBC 8.4 07/26/2021 1033   RBC 4.55 08/05/2021 1122   RBC 4.62 07/26/2021 1033   HGB 13.5 08/05/2021 1122   HCT 40.7 08/05/2021 1122   PLT 373 08/05/2021 1122   MCV 90 08/05/2021 1122   MCH 29.7 08/05/2021 1122  MCH 30.2 04/11/2021 1144   MCHC 33.2 08/05/2021 1122   MCHC 32.9 07/26/2021 1033   RDW 12.4 08/05/2021 1122   LYMPHSABS 2.5 07/26/2021 1033   MONOABS 0.8 07/26/2021 1033   EOSABS 0.1 07/26/2021 1033   BASOSABS 0.1 07/26/2021 1033     Parts of this note may have been dictated using voice recognition software. There may be variances in spelling and vocabulary which are unintentional. Not all errors are proofread. Please notify the Thereasa Parkin if any discrepancies are noted or if the meaning of any statement is not clear.

## 2022-12-16 NOTE — Patient Instructions (Signed)

## 2022-12-20 ENCOUNTER — Other Ambulatory Visit: Payer: Self-pay | Admitting: Cardiology

## 2022-12-20 ENCOUNTER — Other Ambulatory Visit: Payer: Self-pay | Admitting: Family

## 2022-12-26 ENCOUNTER — Other Ambulatory Visit: Payer: Self-pay | Admitting: Cardiology

## 2022-12-30 ENCOUNTER — Other Ambulatory Visit: Payer: Self-pay

## 2022-12-30 MED ORDER — ISOSORBIDE MONONITRATE ER 60 MG PO TB24: ORAL_TABLET | ORAL | 3 refills | Status: DC

## 2023-01-14 LAB — HM DIABETES EYE EXAM

## 2023-02-27 ENCOUNTER — Other Ambulatory Visit: Payer: Self-pay | Admitting: "Endocrinology

## 2023-02-27 DIAGNOSIS — E1165 Type 2 diabetes mellitus with hyperglycemia: Secondary | ICD-10-CM

## 2023-03-18 ENCOUNTER — Ambulatory Visit: Payer: Medicare Other | Admitting: "Endocrinology

## 2023-03-26 ENCOUNTER — Other Ambulatory Visit: Payer: Self-pay | Admitting: Family

## 2023-04-07 ENCOUNTER — Other Ambulatory Visit: Payer: Self-pay | Admitting: "Endocrinology

## 2023-04-07 DIAGNOSIS — E1165 Type 2 diabetes mellitus with hyperglycemia: Secondary | ICD-10-CM

## 2023-04-09 NOTE — Telephone Encounter (Signed)
 RX sent

## 2023-04-29 ENCOUNTER — Encounter: Payer: Self-pay | Admitting: "Endocrinology

## 2023-04-29 ENCOUNTER — Ambulatory Visit (INDEPENDENT_AMBULATORY_CARE_PROVIDER_SITE_OTHER): Admitting: "Endocrinology

## 2023-04-29 VITALS — BP 122/82 | HR 89 | Ht 65.0 in | Wt 163.0 lb

## 2023-04-29 DIAGNOSIS — Z7984 Long term (current) use of oral hypoglycemic drugs: Secondary | ICD-10-CM | POA: Diagnosis not present

## 2023-04-29 DIAGNOSIS — E1140 Type 2 diabetes mellitus with diabetic neuropathy, unspecified: Secondary | ICD-10-CM | POA: Diagnosis not present

## 2023-04-29 DIAGNOSIS — Z794 Long term (current) use of insulin: Secondary | ICD-10-CM | POA: Diagnosis not present

## 2023-04-29 DIAGNOSIS — Z7985 Long-term (current) use of injectable non-insulin antidiabetic drugs: Secondary | ICD-10-CM | POA: Diagnosis not present

## 2023-04-29 DIAGNOSIS — E7800 Pure hypercholesterolemia, unspecified: Secondary | ICD-10-CM

## 2023-04-29 MED ORDER — DEXCOM G7 SENSOR MISC: 1.0000 | 3 refills | Status: AC

## 2023-04-29 MED ORDER — DEXCOM G7 RECEIVER DEVI: 1.0000 | 0 refills | Status: AC

## 2023-04-29 NOTE — Progress Notes (Signed)
 Outpatient Endocrinology Note Bradley Shickshinny, MD  04/29/23   [name] October 03, 69 1957 [id number]  Referring Provider: Olive Bass,* Primary Care Provider: Olive Bass, FNP Reason for consultation: Subjective   Assessment & Plan  Diagnoses and all orders for this visit:  Type 2 diabetes mellitus with diabetic neuropathy, with long-term current use of insulin (HCC)  Long term (current) use of oral hypoglycemic drugs  Long-term (current) use of injectable non-insulin antidiabetic drugs  Long-term insulin use (HCC)  Pure hypercholesterolemia  Other orders -     Continuous Glucose Receiver (DEXCOM G7 RECEIVER) DEVI; 1 Device by Does not apply route continuous. -     Continuous Glucose Sensor (DEXCOM G7 SENSOR) MISC; 1 Device by Does not apply route continuous.   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, MI  Lab Results  Component Value Date   GFR 48.78 (L) 08/13/2022   Hba1c goal less than 7, current Hba1c is 6.8% (04/29/23) Lab Results  Component Value Date   HGBA1C 6.4 (A) 12/16/2022   Will recommend the following: Farxiga 10 mg daily Ozempic 2 mg weekly  Lantus 15 units qam     Ordered DexCom   No known contraindications to any of above medications Glucagon explained and prescribed with refills 08/13/22  -Last LD and Tg are as follows: Lab Results  Component Value Date   LDLCALC 49 08/13/2022    Lab Results  Component Value Date   TRIG 84.0 08/13/2022   -Recommend atorvastatin 80 mg QD -Follow low fat diet and exercise   -Blood pressure goal <140/90 - Microalbumin/creatinine goal < 30 -Last MA/Cr is as follows: Lab Results  Component Value Date   MICROALBUR 0.8 12/16/2022   - on ACE/ARB lisinopril 20 mg qd -diet changes including salt restriction -limit eating outside -counseled BP targets per standards of diabetes care -uncontrolled blood pressure can lead to retinopathy, nephropathy and cardiovascular and  atherosclerotic heart disease  Reviewed and counseled on: -A1C target -Blood sugar targets -Complications of uncontrolled diabetes  -Checking blood sugar before meals and bedtime and bring log next visit -All medications with mechanism of action and side effects -Hypoglycemia management: rule of 15's, Glucagon Emergency Kit and medical alert ID -low-carb low-fat plate-method diet -At least 20 minutes of physical activity per day -Annual dilated retinal eye exam and foot exam -compliance and follow up needs -follow up as scheduled or earlier if problem gets worse  Call if blood sugar is less than 70 or consistently above 250    Take a 15 gm snack of carbohydrate at bedtime before you go to sleep if your blood sugar is less than 100.    If you are going to fast after midnight for a test or procedure, ask your physician for instructions on how to reduce/decrease your insulin dose.    Call if blood sugar is less than 70 or consistently above 250  -Treating a low sugar by rule of 15  (15 gms of sugar every 15 min until sugar is more than 70) If you feel your sugar is low, test your sugar to be sure If your sugar is low (less than 70), then take 15 grams of a fast acting Carbohydrate (3-4 glucose tablets or glucose gel or 4 ounces of juice or regular soda) Recheck your sugar 15 min after treating low to make sure it is more than 70 If sugar is still less than 70, treat again with 15 grams of carbohydrate  Don't drive the hour of hypoglycemia  If unconscious/unable to eat or drink by mouth, use glucagon injection or nasal spray baqsimi and call 911. Can repeat again in 15 min if still unconscious.  Return in about 2 months (around 06/29/2023).   I have reviewed current medications, nurse's notes, allergies, vital signs, past medical and surgical history, family medical history, and social history for this encounter. Counseled patient on symptoms, examination findings, lab findings,  imaging results, treatment decisions and monitoring and prognosis. The patient understood the recommendations and agrees with the treatment plan. All questions regarding treatment plan were fully answered.  Bradley [place], MD  04/29/23    History of Present Illness [name] is a 68 y.o. year old male who presents for follow up of Type II diabetes mellitus.  [name].   Diabetes education +  Current regimen: Farxiga 10 mg daily Ozempic 2 mg weekly  Lantus 15 units qam     Previous history: Non-insulin hypoglycemic drugs previously used: Farxiga, Actos, Ozempic Insulin was started in 2018 when his A1c was 15.6  COMPLICATIONS +  MI/ - stroke -  retinopathy +  neuropathy +  nephropathy  BLOOD SUGAR DATA Did not bring meter  Physical Exam  BP 122/82   Pulse 89   Ht 5\' 5"  (1.651 m)   Wt 163 lb (73.9 kg)   SpO2 97%   BMI 27.12 kg/m    Constitutional: well developed, well nourished Head: normocephalic, atraumatic Eyes: sclera anicteric, no redness Neck: supple Lungs: normal respiratory effort Neurology: alert and oriented Skin: dry, no appreciable rashes Musculoskeletal: no appreciable defects Psychiatric: normal mood and affect Diabetic Foot Exam - Simple   No data filed      Current Medications Patient's Medications  New Prescriptions   CONTINUOUS GLUCOSE RECEIVER (DEXCOM G7 RECEIVER) DEVI    1 Device by Does not apply route continuous.   CONTINUOUS GLUCOSE SENSOR (DEXCOM G7 SENSOR) MISC    1 Device by Does not apply route continuous.  Previous Medications   AMLODIPINE (NORVASC) 5 MG TABLET    Take 1 tablet (5 mg total) by mouth daily.   ASPIRIN EC 81 MG EC TABLET    Take 1 tablet (81 mg total) by mouth daily.   ATORVASTATIN (LIPITOR) 80 MG TABLET    Take 1 tablet (80 mg total) by mouth daily.   CARVEDILOL (COREG) 12.5 MG TABLET    Take 1 tablet (12.5 mg total) by mouth 2 (two) times daily with a meal.    CLOPIDOGREL (PLAVIX) 75 MG TABLET    Take 1 tablet (75 mg total) by mouth daily.   DAPAGLIFLOZIN PROPANEDIOL (FARXIGA) 10 MG TABS TABLET    Take 1 tablet (10 mg total) by mouth daily before breakfast.   GLUCAGON (BAQSIMI ONE PACK) 3 MG/DOSE POWD    Place 1 Device into the nose as needed (Low blood sugar with impaired consciousness).   HYDROCHLOROTHIAZIDE (HYDRODIURIL) 25 MG TABLET    Take 1/2 (one-half) tablet by mouth once daily   ISOSORBIDE MONONITRATE (IMDUR) 60 MG 24 HR TABLET    TAKE 1 & 1/2 (ONE & ONE-HALF) TABLETS BY MOUTH ONCE DAILY .   LANTUS SOLOSTAR 100 UNIT/ML SOLOSTAR PEN    INJECT 15 UNITS SUBCUTANEOUSLY IN THE MORNING   LISINOPRIL (ZESTRIL) 20 MG TABLET    TAKE 1 TABLET BY MOUTH ONCE DAILY . APPOINTMENT REQUIRED FOR FUTURE REFILLS   MULTIPLE VITAMIN (MULTIVITAMIN WITH MINERALS) TABS TABLET  Take 1 tablet by mouth daily.   OZEMPIC, 2 MG/DOSE, 8 MG/3ML SOPN    INJECT 2 MG INTO THE SKIN ONCE A WEEK  Modified Medications   No medications on file  Discontinued Medications   No medications on file    Allergies Allergies  Allergen Reactions   Ambien [Zolpidem Tartrate] Other (See Comments)    Patient stated this medication made him "go crazy"   Metformin And Related Diarrhea    Past Medical History Past Medical History:  Diagnosis Date   Asthma    CAD (coronary artery disease)    a. non obst dz by cath 2004  b. 11/12/15 which showed no obvious large vessel CAD. No PCI or clear reason for NSTEMI ( possibly hypertensive urgency?).    CARDIOMYOPATHY    a. NICM, LVEF 30% 01/2011 echo; 20-25% 07/2012 echo, EF recovered by echo 06/2015.   Congestive heart failure (CHF) (HCC)    DIABETES MELLITUS, TYPE II    HYPERCHOLESTEROLEMIA    HYPERTENSION    Myocardial infarct (HCC) 10/2015   Noncompliance    Prostate cancer (HCC)    prostate cancer   PVCs (premature ventricular contractions)    a. s/p ablation 01-07-2013 by Dr Johney Frame    Past Surgical History Past Surgical History:   Procedure Laterality Date   ABLATION  01-07-2013   RVOT PVC's ablated by Dr Johney Frame along anteroseptal RVOT   CARDIAC CATHETERIZATION  2004   nonobst dz   CARDIAC CATHETERIZATION N/A 11/12/2015   Procedure: Left Heart Cath and Coronary Angiography;  Surgeon: Tonny Bollman, MD;  Location[facility] INVASIVE CV LAB;  Service: Cardiovascular;  Laterality: N/A;   CHOLECYSTECTOMY N/A 11/16/2013   Procedure: LAPAROSCOPIC CHOLECYSTECTOMY WITH INTRAOPERATIVE CHOLANGIOGRAM;  Surgeon: Glenna Fellows, MD;  Location: [facility] ORS;  Service: General;  Laterality: N/A;   LYMPHADENECTOMY Bilateral 02/22/2014   Procedure: PELVIC LYMPH NODE DISSECTION;  Surgeon: Sebastian Ache, MD;  Location: [facility];  Service: Urology;  Laterality: Bilateral;   PROSTATE SURGERY     ROBOT ASSISTED LAPAROSCOPIC RADICAL PROSTATECTOMY N/A 02/22/2014   Procedure: ROBOTIC ASSISTED LAPAROSCOPIC RADICAL PROSTATECTOMY WITH INDOCYANINE GREEN DYE AND OPEN UMBILICAL HERNIA REPAIR;  Surgeon: Sebastian Ache, MD;  Location: [facility];  Service: Urology;  Laterality: N/A;   SUPRAVENTRICULAR TACHYCARDIA ABLATION N/A 09/28/2012   Procedure: Kirby Funk Ablation;  Surgeon: Hillis Range, MD;  Location: [facility] CATH LAB;  Service: Cardiovascular;  Laterality: N/A;   SURGERY SCROTAL / TESTICULAR     at age 17   V-TACH ABLATION N/A 01/07/2013   Procedure: V-TACH ABLATION;  Surgeon: Gardiner Rhyme, MD;  Location: [facility] CATH LAB;  Service: Cardiovascular;  Laterality: N/A;    Family History family history includes Colon cancer in his sister; Diabetes in his mother and son; Heart attack in his paternal grandfather; Heart failure in his maternal grandmother; Hypertension in his mother and another family member; Lung cancer in his brother; Pancreatic cancer in his paternal aunt; Peripheral vascular disease in his mother and another family member; Prostate cancer in his brother, brother, brother, brother and another family member.  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  Vaping Use   Vaping status: Never Used  Substance and Sexual Activity   Alcohol use: Yes    Comment: rare   Drug use: No   Sexual activity: Never  Other Topics Concern   Not on file  Social History Narrative   Single - divorced x 3 -   Lives with 2 sons, enjoys spending time with 6 g-kids and 3 kids   Social Drivers of Corporate investment banker Strain: Not on file  Food Insecurity: Not on file  Transportation Needs: Not on file  Physical Activity: Not on file  Stress: Not on file  Social Connections: Unknown (03/25/2022)   Received from [facility], [facility]   Social Network    Social Network: Not on file  Intimate Partner Violence: Unknown (03/25/2022)   Received from [facility], [facility]   HITS    Physically Hurt: Not on file    Insult or Talk Down To: Not on file    Threaten Physical Harm: Not on file    Scream or Curse: Not on file    Lab Results  Component Value Date   HGBA1C 6.4 (A) 12/16/2022   HGBA1C 5.9 (A) 08/13/2022   HGBA1C 6.6 (H) 05/09/2022   Lab Results  Component Value Date   CHOL 101 08/13/2022   Lab Results  Component Value Date   HDL 34.90 (L) 08/13/2022   Lab Results  Component Value Date   LDLCALC 49 08/13/2022   Lab Results  Component Value Date   TRIG 84.0 08/13/2022   Lab Results  Component Value Date   CHOLHDL 3 08/13/2022   Lab Results  Component Value Date   CREATININE 1.47 08/13/2022   Lab Results  Component Value Date   GFR 48.78 (L) 08/13/2022   Lab Results  Component Value Date   MICROALBUR 0.8  12/16/2022      Component Value Date/Time   NA 144 08/13/2022 1032   NA 142 08/05/2021 1122   K 4.0 08/13/2022 1032   CL 110 08/13/2022 1032   CO2 25 08/13/2022 1032   GLUCOSE 103 (H) 08/13/2022 1032   BUN 16 08/13/2022 1032   BUN 17 08/05/2021 1122   CREATININE 1.47 08/13/2022 1032   CREATININE 1.42 (H) 12/11/2015 1520   CALCIUM 9.2 08/13/2022 1032   PROT 6.7 08/13/2022 1032   ALBUMIN 3.7 08/13/2022 1032   AST 25 08/13/2022 1032   ALT 28 08/13/2022 1032   ALKPHOS 98 08/13/2022 1032   BILITOT 0.5 08/13/2022 1032   GFRNONAA 37 (L) 04/11/2021 1144   GFRAA 37 (L) 06/24/2019 0631      Latest Ref Rng & Units 08/13/2022   10:32 AM 05/09/2022   10:36 AM 01/07/2022    9:33 AM  BMP  Glucose 70 - 99 mg/dL 409  811  98   BUN 6 - 23 mg/dL 16  18  27    Creatinine 0.40 - 1.50 mg/dL 9.14  7.82  9.56   Sodium 135 - 145 mEq/L 144  141  140   Potassium 3.5 - 5.1 mEq/L 4.0  3.8  4.2   Chloride 96 - 112 mEq/L 110  106  105   CO2 19 - 32 mEq/L 25  27  27    Calcium 8.4 - 10.5 mg/dL 9.2  9.4  21.3        Component Value Date/Time   WBC 7.7 08/05/2021 1122  WBC 8.4 07/26/2021 1033   RBC 4.55 08/05/2021 1122   RBC 4.62 07/26/2021 1033   HGB 13.5 08/05/2021 1122   HCT 40.7 08/05/2021 1122   PLT 373 08/05/2021 1122   MCV 90 08/05/2021 1122   MCH 29.7 08/05/2021 1122   MCH 30.2 04/11/2021 1144   MCHC 33.2 08/05/2021 1122   MCHC 32.9 07/26/2021 1033   RDW 12.4 08/05/2021 1122   LYMPHSABS 2.5 07/26/2021 1033   MONOABS 0.8 07/26/2021 1033   EOSABS 0.1 07/26/2021 1033   BASOSABS 0.1 07/26/2021 1033     Parts of this note may have been dictated using voice recognition software. There may be variances in spelling and vocabulary which are unintentional. Not all errors are proofread. Please notify the Thereasa Parkin if any discrepancies are noted or if the meaning of any statement is not clear.

## 2023-04-29 NOTE — Patient Instructions (Signed)

## 2023-04-30 ENCOUNTER — Other Ambulatory Visit: Payer: Self-pay

## 2023-04-30 MED ORDER — DAPAGLIFLOZIN PROPANEDIOL 10 MG PO TABS: 10.0000 mg | ORAL_TABLET | Freq: Every day | ORAL | 1 refills | Status: DC

## 2023-05-26 ENCOUNTER — Other Ambulatory Visit: Payer: Self-pay | Admitting: "Endocrinology

## 2023-05-26 DIAGNOSIS — E1165 Type 2 diabetes mellitus with hyperglycemia: Secondary | ICD-10-CM

## 2023-05-26 NOTE — Telephone Encounter (Signed)
 Requested Prescriptions   Pending Prescriptions Disp Refills   OZEMPIC , 2 MG/DOSE, 8 MG/3ML SOPN [Pharmacy Med Name: Ozempic  (2 MG/DOSE) 8 MG/3ML Subcutaneous Solution Pen-injector] 9 mL 0    Sig: INJECT 2 MG INTO THE SKIN ONCE A WEEK

## 2023-06-19 ENCOUNTER — Other Ambulatory Visit: Payer: Self-pay | Admitting: Family

## 2023-06-29 ENCOUNTER — Encounter: Payer: Self-pay | Admitting: "Endocrinology

## 2023-06-29 ENCOUNTER — Ambulatory Visit (INDEPENDENT_AMBULATORY_CARE_PROVIDER_SITE_OTHER): Admitting: "Endocrinology

## 2023-06-29 VITALS — BP 122/80 | HR 85 | Ht 65.0 in | Wt 165.0 lb

## 2023-06-29 DIAGNOSIS — E1140 Type 2 diabetes mellitus with diabetic neuropathy, unspecified: Secondary | ICD-10-CM | POA: Diagnosis not present

## 2023-06-29 DIAGNOSIS — Z7984 Long term (current) use of oral hypoglycemic drugs: Secondary | ICD-10-CM

## 2023-06-29 DIAGNOSIS — Z7985 Long-term (current) use of injectable non-insulin antidiabetic drugs: Secondary | ICD-10-CM | POA: Diagnosis not present

## 2023-06-29 DIAGNOSIS — Z794 Long term (current) use of insulin: Secondary | ICD-10-CM | POA: Diagnosis not present

## 2023-06-29 DIAGNOSIS — E7800 Pure hypercholesterolemia, unspecified: Secondary | ICD-10-CM

## 2023-06-29 LAB — POCT GLYCOSYLATED HEMOGLOBIN (HGB A1C): Hemoglobin A1C: 7 % — AB (ref 4.0–5.6)

## 2023-06-29 MED ORDER — GLIPIZIDE ER 2.5 MG PO TB24: 2.5000 mg | ORAL_TABLET | Freq: Every day | ORAL | 1 refills | Status: DC

## 2023-06-29 NOTE — Progress Notes (Signed)
 Outpatient Endocrinology Note [name]  06/29/23   [name] 07-May-1955 [id number]  Referring Provider: Adra [name],* Primary Care Provider: Adra Alanis, [name] Reason for consultation: Subjective   Assessment & Plan  Diagnoses and all orders for this visit:  Type 2 diabetes mellitus with diabetic neuropathy, with long-term current use of insulin  (HCC) -     POCT glycosylated hemoglobin (Hb A1C) -     Lipid panel -     Comprehensive metabolic panel with GFR  Long term (current) use of oral hypoglycemic drugs  Long-term (current) use of injectable non-insulin  antidiabetic drugs  Long-term insulin  use (HCC)  Pure hypercholesterolemia  Other orders -     glipiZIDE (GLUCOTROL XL) 2.5 MG 24 hr tablet; Take 1 tablet (2.5 mg total) by mouth daily with breakfast.    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, MI  Lab Results  Component Value Date   GFR 48.78 (L) 08/13/2022   Hba1c goal less than 7, current Hba1c is 6.8% (04/29/23) Lab Results  Component Value Date   HGBA1C 7.0 (A) 06/29/2023   Will recommend the following: Farxiga  10 mg daily Ozempic  2 mg weekly  06/29/23: Start glipizide Xl 2.5 mg qam, Stop Lantus  15 units qam     Ordered DexCom   No known contraindications to any of above medications Glucagon  explained and prescribed with refills 08/13/22  -Last LD and Tg are as follows: Lab Results  Component Value Date   LDLCALC 49 08/13/2022    Lab Results  Component Value Date   TRIG 84.0 08/13/2022   -Recommend atorvastatin  80 mg QD -Follow low fat diet and exercise   -Blood pressure goal <140/90 - Microalbumin/creatinine goal < 30 -Last MA/Cr is as follows: Lab Results  Component Value Date   MICROALBUR 0.8 12/16/2022   - on ACE/ARB lisinopril  20 mg qd -diet changes including salt restriction -limit eating outside -counseled BP targets per standards of diabetes care -uncontrolled blood pressure can lead  to retinopathy, nephropathy and cardiovascular and atherosclerotic heart disease  Reviewed and counseled on: -A1C target -Blood sugar targets -Complications of uncontrolled diabetes  -Checking blood sugar before meals and bedtime and bring log next visit -All medications with mechanism of action and side effects -Hypoglycemia management: rule of 15's, Glucagon  Emergency Kit and medical alert ID -low-carb low-fat plate-method diet -At least 20 minutes of physical activity per day -Annual dilated retinal eye exam and foot exam -compliance and follow up needs -follow up as scheduled or earlier if problem gets worse  Call if blood sugar is less than 70 or consistently above 250    Take a 15 gm snack of carbohydrate at bedtime before you go to sleep if your blood sugar is less than 100.    If you are going to fast after midnight for a test or procedure, ask your physician for instructions on how to reduce/decrease your insulin  dose.    Call if blood sugar is less than 70 or consistently above 250  -Treating a low sugar by rule of 15  (15 gms of sugar every 15 min until sugar is more than 70) If you feel your sugar is low, test your sugar to be sure If your sugar is low (less than 70), then take 15 grams of a fast acting Carbohydrate (3-4 glucose tablets or glucose gel or 4 ounces of juice or regular soda) Recheck your sugar 15 min after treating low to make sure it is more than 70  If sugar is still less than 70, treat again with 15 grams of carbohydrate          Don't drive the hour of hypoglycemia  If unconscious/unable to eat or drink by mouth, use glucagon  injection or nasal spray baqsimi  and call 911. Can repeat again in 15 min if still unconscious.  Return in about 6 weeks (around 08/10/2023) for visit and 8 am labs before next visit.   I have reviewed current medications, nurse's notes, allergies, vital signs, past medical and surgical history, family medical history, and social  history for this encounter. Counseled patient on symptoms, examination findings, lab findings, imaging results, treatment decisions and monitoring and prognosis. The patient understood the recommendations and agrees with the treatment plan. All questions regarding treatment plan were fully answered.  [name]  06/29/23    History of Present Illness [name] is a 68 y.o. year old male who presents for follow up of Type II diabetes mellitus.  [name] was first diagnosed in 2008.   Diabetes education +  Current regimen: Farxiga  10 mg daily Ozempic  2 mg weekly  Lantus  15 units qam     Previous history: Non-insulin  hypoglycemic drugs previously used: Farxiga , Actos , Ozempic  Insulin  was started in 2018 when his A1c was 15.6  COMPLICATIONS +  MI/ - stroke -  retinopathy +  neuropathy +  nephropathy  BLOOD SUGAR DATA  CGM interpretation: At today's visit, we reviewed her CGM downloads. The full report is scanned in the media. Reviewing the CGM trends, BG are well-controlled across the day with some elevations.  Physical Exam  BP 122/80   Pulse 85   Ht 5\' 5"  (1.651 m)   Wt 165 lb (74.8 kg)   SpO2 96%   BMI 27.46 kg/m    Constitutional: well developed, well nourished Head: normocephalic, atraumatic Eyes: sclera anicteric, no redness Neck: supple Lungs: normal respiratory effort Neurology: alert and oriented Skin: dry, no appreciable rashes Musculoskeletal: no appreciable defects Psychiatric: normal mood and affect Diabetic Foot Exam - Simple   Simple Foot Form Diabetic Foot exam was performed with the following findings: Yes 06/29/2023  1:22 PM  Visual Inspection No deformities, no ulcerations, no other skin breakdown bilaterally: Yes Sensation Testing Intact to touch and monofilament testing bilaterally: Yes Pulse Check Posterior Tibialis and Dorsalis pulse intact bilaterally: Yes Comments      Current Medications Patient's Medications   New Prescriptions   GLIPIZIDE (GLUCOTROL XL) 2.5 MG 24 HR TABLET    Take 1 tablet (2.5 mg total) by mouth daily with breakfast.  Previous Medications   AMLODIPINE  (NORVASC ) 5 MG TABLET    Take 1 tablet (5 mg total) by mouth daily.   ASPIRIN  EC 81 MG EC TABLET    Take 1 tablet (81 mg total) by mouth daily.   ATORVASTATIN  (LIPITOR ) 80 MG TABLET    Take 1 tablet (80 mg total) by mouth daily.   CARVEDILOL  (COREG ) 12.5 MG TABLET    Take 1 tablet (12.5 mg total) by mouth 2 (two) times daily with a meal.   CLOPIDOGREL  (PLAVIX ) 75 MG TABLET    Take 1 tablet (75 mg total) by mouth daily.   CONTINUOUS GLUCOSE RECEIVER (DEXCOM G7 RECEIVER) DEVI    1 Device by Does not apply route continuous.   CONTINUOUS GLUCOSE SENSOR (DEXCOM G7 SENSOR) MISC    1 Device by Does not apply route continuous.   DAPAGLIFLOZIN  PROPANEDIOL (FARXIGA ) 10 MG TABS TABLET  Take 1 tablet (10 mg total) by mouth daily before breakfast.   GLUCAGON  (BAQSIMI  ONE PACK) 3 MG/DOSE POWD    Place 1 Device into the nose as needed (Low blood sugar with impaired consciousness).   HYDROCHLOROTHIAZIDE  (HYDRODIURIL ) 25 MG TABLET    Take 0.5 tablets (12.5 mg total) by mouth daily.   ISOSORBIDE  MONONITRATE (IMDUR ) 60 MG 24 HR TABLET    TAKE 1 & 1/2 (ONE & ONE-HALF) TABLETS BY MOUTH ONCE DAILY .   LANTUS  SOLOSTAR 100 UNIT/ML SOLOSTAR PEN    INJECT 15 UNITS SUBCUTANEOUSLY IN THE MORNING   LISINOPRIL  (ZESTRIL ) 20 MG TABLET    TAKE 1 TABLET BY MOUTH ONCE DAILY . APPOINTMENT REQUIRED FOR FUTURE REFILLS   MULTIPLE VITAMIN (MULTIVITAMIN WITH MINERALS) TABS TABLET    Take 1 tablet by mouth daily.   OZEMPIC , 2 MG/DOSE, 8 MG/3ML SOPN    INJECT 2 MG INTO THE SKIN ONCE A WEEK  Modified Medications   No medications on file  Discontinued Medications   No medications on file    Allergies Allergies  Allergen Reactions   Ambien  [Zolpidem  Tartrate] Other (See Comments)    Patient stated this medication made him "go crazy"   Metformin  And Related Diarrhea     Past Medical History Past Medical History:  Diagnosis Date   Asthma    CAD (coronary artery disease)    a. non obst dz by cath 2004  b. 11/12/15 which showed no obvious large vessel CAD. No PCI or clear reason for NSTEMI ( possibly hypertensive urgency?).    CARDIOMYOPATHY    a. NICM, LVEF 30% 01/2011 echo; 20-25% 07/2012 echo, EF recovered by echo 06/2015.   Congestive heart failure (CHF) (HCC)    DIABETES MELLITUS, TYPE II    HYPERCHOLESTEROLEMIA    HYPERTENSION    Myocardial infarct (HCC) 10/2015   Noncompliance    Prostate cancer (HCC)    prostate cancer   PVCs (premature ventricular contractions)    a. s/p ablation 01-07-2013 by Dr Nunzio Belch    Past Surgical History Past Surgical History:  Procedure Laterality Date   ABLATION  01-07-2013   RVOT PVC's ablated by Dr Nunzio Belch along anteroseptal RVOT   CARDIAC CATHETERIZATION  2004   nonobst dz   CARDIAC CATHETERIZATION N/A 11/12/2015   Procedure: Left Heart Cath and Coronary Angiography;  Surgeon: Arnoldo Lapping, [name];  Location: [facility] INVASIVE CV LAB;  Service: Cardiovascular;  Laterality: N/A;   CHOLECYSTECTOMY N/A 11/16/2013   Procedure: LAPAROSCOPIC CHOLECYSTECTOMY WITH INTRAOPERATIVE CHOLANGIOGRAM;  Surgeon: Ayesha Lente, [name];  Location: [facility];  Service: General;  Laterality: N/A;   LYMPHADENECTOMY Bilateral 02/22/2014   Procedure: PELVIC LYMPH NODE DISSECTION;  Surgeon: Osborn Blaze, [name];  Location: [facility];  Service: Urology;  Laterality: Bilateral;   PROSTATE SURGERY     ROBOT ASSISTED LAPAROSCOPIC RADICAL PROSTATECTOMY N/A 02/22/2014   Procedure: ROBOTIC ASSISTED LAPAROSCOPIC RADICAL PROSTATECTOMY WITH INDOCYANINE GREEN DYE AND OPEN UMBILICAL HERNIA REPAIR;  Surgeon: Osborn Blaze, [name];  Location: [facility];  Service: Urology;  Laterality: N/A;   SUPRAVENTRICULAR TACHYCARDIA ABLATION N/A 09/28/2012   Procedure: Parley Bolls Ablation;  Surgeon: Jolly Needle, [name];  Location: [facility] CATH LAB;  Service: Cardiovascular;  Laterality: N/A;    SURGERY SCROTAL / TESTICULAR     at age 74   V-TACH ABLATION N/A 01/07/2013   Procedure: V-TACH ABLATION;  Surgeon: Ellaree Gunther, [name];  Location: [facility] CATH LAB;  Service: Cardiovascular;  Laterality: N/A;    Family History family history includes Colon cancer in  his sister; Diabetes in his mother and son; Heart attack in his paternal grandfather; Heart failure in his maternal grandmother; Hypertension in his mother and another family member; Lung cancer in his brother; Pancreatic cancer in his paternal aunt; Peripheral vascular disease in his mother and another family member; Prostate cancer in his brother, brother, brother, brother and another family member.  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  Vaping Use   Vaping status: Never Used  Substance and Sexual Activity   Alcohol use: Yes    Comment: rare   Drug use: No   Sexual activity: Never  Other Topics Concern   Not on file  Social History Narrative   Single - divorced x 3 -   Lives with 2 sons, enjoys spending time with 6 g-kids and 3 kids   Social Drivers of Corporate investment banker Strain: Not on file  Food Insecurity: Not on file  Transportation Needs: Not on file  Physical Activity: Not on file  Stress: Not on file  Social Connections: Unknown (03/25/2022)   Received from [facility], [facility]   Social Network    Social Network: Not on file  Intimate Partner Violence: Unknown (03/25/2022)   Received from [facility], [facility]   HITS    Physically Hurt: Not on file    Insult or Talk Down To: Not  on file    Threaten Physical Harm: Not on file    Scream or Curse: Not on file    Lab Results  Component Value Date   HGBA1C 7.0 (A) 06/29/2023   HGBA1C 6.4 (A) 12/16/2022   HGBA1C 5.9 (A) 08/13/2022   Lab Results  Component Value Date   CHOL 101 08/13/2022   Lab Results  Component Value Date   HDL 34.90 (L) 08/13/2022   Lab Results  Component Value Date   LDLCALC 49 08/13/2022   Lab Results  Component Value Date   TRIG 84.0 08/13/2022   Lab Results  Component Value Date   CHOLHDL 3 08/13/2022   Lab Results  Component Value Date   CREATININE 1.47 08/13/2022   Lab Results  Component Value Date   GFR 48.78 (L) 08/13/2022   Lab Results  Component Value Date   MICROALBUR 0.8 12/16/2022      Component Value Date/Time   NA 144 08/13/2022 1032   NA 142 08/05/2021 1122   K 4.0 08/13/2022 1032   CL 110 08/13/2022 1032   CO2 25 08/13/2022 1032   GLUCOSE 103 (H) 08/13/2022 1032   BUN 16 08/13/2022 1032   BUN 17 08/05/2021 1122   CREATININE 1.47 08/13/2022 1032   CREATININE 1.42 (H) 12/11/2015 1520   CALCIUM  9.2 08/13/2022 1032   PROT 6.7 08/13/2022 1032   ALBUMIN 3.7 08/13/2022 1032   AST 25 08/13/2022 1032   ALT 28 08/13/2022 1032   ALKPHOS 98 08/13/2022 1032   BILITOT 0.5 08/13/2022 1032   GFRNONAA 37 (L) 04/11/2021 1144   GFRAA 37 (L) 06/24/2019 0631  Latest Ref Rng & Units 08/13/2022   10:32 AM 05/09/2022   10:36 AM 01/07/2022    9:33 AM  BMP  Glucose 70 - 99 mg/dL 409  811  98   BUN 6 - 23 mg/dL 16  18  27    Creatinine 0.40 - 1.50 mg/dL 9.14  7.82  9.56   Sodium 135 - 145 mEq/L 144  141  140   Potassium 3.5 - 5.1 mEq/L 4.0  3.8  4.2   Chloride 96 - 112 mEq/L 110  106  105   CO2 19 - 32 mEq/L 25  27  27    Calcium  8.4 - 10.5 mg/dL 9.2  9.4  21.3        Component Value Date/Time   WBC 7.7 08/05/2021 1122   WBC 8.4 07/26/2021 1033   RBC 4.55 08/05/2021 1122   RBC 4.62 07/26/2021 1033   HGB 13.5 08/05/2021 1122   HCT 40.7 08/05/2021 1122    PLT 373 08/05/2021 1122   MCV 90 08/05/2021 1122   MCH 29.7 08/05/2021 1122   MCH 30.2 04/11/2021 1144   MCHC 33.2 08/05/2021 1122   MCHC 32.9 07/26/2021 1033   RDW 12.4 08/05/2021 1122   LYMPHSABS 2.5 07/26/2021 1033   MONOABS 0.8 07/26/2021 1033   EOSABS 0.1 07/26/2021 1033   BASOSABS 0.1 07/26/2021 1033     Parts of this note may have been dictated using voice recognition software. There may be variances in spelling and vocabulary which are unintentional. Not all errors are proofread. Please notify the [facility] if any discrepancies are noted or if the meaning of any statement is not clear.

## 2023-06-29 NOTE — Patient Instructions (Signed)

## 2023-07-21 ENCOUNTER — Other Ambulatory Visit: Payer: Self-pay | Admitting: "Endocrinology

## 2023-07-21 DIAGNOSIS — E1165 Type 2 diabetes mellitus with hyperglycemia: Secondary | ICD-10-CM

## 2023-07-21 NOTE — Telephone Encounter (Signed)
 Requested Prescriptions   Pending Prescriptions Disp Refills   LANTUS  SOLOSTAR 100 UNIT/ML Solostar Pen [Pharmacy Med Name: Lantus  SoloStar 100 UNIT/ML Subcutaneous Solution Pen-injector] 15 mL 0    Sig: INJECT 15 UNITS SUBCUTANEOUSLY IN THE MORNING

## 2023-07-28 ENCOUNTER — Other Ambulatory Visit

## 2023-07-29 ENCOUNTER — Other Ambulatory Visit

## 2023-07-29 LAB — COMPREHENSIVE METABOLIC PANEL WITH GFR
AG Ratio: 1.3 (calc) (ref 1.0–2.5)
ALT: 28 U/L (ref 9–46)
AST: 22 U/L (ref 10–35)
Albumin: 4.1 g/dL (ref 3.6–5.1)
Alkaline phosphatase (APISO): 121 U/L (ref 35–144)
BUN/Creatinine Ratio: 11 (calc) (ref 6–22)
BUN: 16 mg/dL (ref 7–25)
CO2: 27 mmol/L (ref 20–32)
Calcium: 9.9 mg/dL (ref 8.6–10.3)
Chloride: 106 mmol/L (ref 98–110)
Creat: 1.49 mg/dL — ABNORMAL HIGH (ref 0.70–1.35)
Globulin: 3.1 g/dL (ref 1.9–3.7)
Glucose, Bld: 123 mg/dL — ABNORMAL HIGH (ref 65–99)
Potassium: 4.1 mmol/L (ref 3.5–5.3)
Sodium: 139 mmol/L (ref 135–146)
Total Bilirubin: 0.4 mg/dL (ref 0.2–1.2)
Total Protein: 7.2 g/dL (ref 6.1–8.1)
eGFR: 50 mL/min/{1.73_m2} — ABNORMAL LOW (ref >=60)

## 2023-07-29 LAB — LIPID PANEL
Cholesterol: 98 mg/dL (ref <200)
HDL: 40 mg/dL (ref >=40)
LDL Cholesterol (Calc): 42 mg/dL
Non-HDL Cholesterol (Calc): 58 mg/dL (ref <130)
Total CHOL/HDL Ratio: 2.5 (calc) (ref <5.0)
Triglycerides: 82 mg/dL (ref <150)

## 2023-08-05 ENCOUNTER — Ambulatory Visit (INDEPENDENT_AMBULATORY_CARE_PROVIDER_SITE_OTHER): Admitting: "Endocrinology

## 2023-08-05 ENCOUNTER — Encounter: Payer: Self-pay | Admitting: "Endocrinology

## 2023-08-05 VITALS — BP 120/90 | HR 96 | Ht 65.0 in | Wt 165.0 lb

## 2023-08-05 DIAGNOSIS — E1140 Type 2 diabetes mellitus with diabetic neuropathy, unspecified: Secondary | ICD-10-CM | POA: Diagnosis not present

## 2023-08-05 DIAGNOSIS — Z7984 Long term (current) use of oral hypoglycemic drugs: Secondary | ICD-10-CM

## 2023-08-05 DIAGNOSIS — Z7985 Long-term (current) use of injectable non-insulin antidiabetic drugs: Secondary | ICD-10-CM | POA: Diagnosis not present

## 2023-08-05 DIAGNOSIS — E7800 Pure hypercholesterolemia, unspecified: Secondary | ICD-10-CM

## 2023-08-05 DIAGNOSIS — Z794 Long term (current) use of insulin: Secondary | ICD-10-CM

## 2023-08-05 NOTE — Patient Instructions (Signed)

## 2023-08-05 NOTE — Progress Notes (Signed)
 Outpatient Endocrinology Note Bradley Birmingham, MD  08/05/23   Roselyn LITTIE Louder 16-Aug-1955 [id number]  Referring Provider: Jason Leita Repine,* Primary Care Provider: Jason Leita Repine, FNP Reason for consultation: Subjective   Assessment & Plan  Diagnoses and all orders for this visit:  Type 2 diabetes mellitus with diabetic neuropathy, with long-term current use of insulin  [facility]) -     Ambulatory referral to Urology  Long term (current) use of oral hypoglycemic drugs  Long-term (current) use of injectable non-insulin  antidiabetic drugs  Long-term insulin  use (HCC)  Pure hypercholesterolemia   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, MI  Lab Results  Component Value Date   GFR 48.78 (L) 08/13/2022   Hba1c goal less than 7, current Hba1c is 6.8% (04/29/23) Lab Results  Component Value Date   HGBA1C 7.0 (A) 06/29/2023   Will recommend the following: Farxiga  10 mg daily Ozempic  2 mg weekly  06/29/23: Start glipizide  Xl 2.5 mg qam  Stopped Lantus  15 units qam    On DexCom   No known contraindications to any of above medications Glucagon  explained and prescribed with refills 08/13/22  -Last LD and Tg are as follows: Lab Results  Component Value Date   LDLCALC 42 07/29/2023    Lab Results  Component Value Date   TRIG 82 07/29/2023   -Recommend atorvastatin  80 mg QD -Follow low fat diet and exercise   -Blood pressure goal <140/90 - Microalbumin/creatinine goal < 30 -Last MA/Cr is as follows: No results found for: MICROALBUR, [id number]  - on ACE/ARB lisinopril  20 mg qd -diet changes including salt restriction -limit eating outside -counseled BP targets per standards of diabetes care -uncontrolled blood pressure can lead to retinopathy, nephropathy and cardiovascular and atherosclerotic heart disease  Reviewed and counseled on: -A1C target -Blood sugar targets -Complications of uncontrolled diabetes  -Checking blood sugar before meals  and bedtime and bring log next visit -All medications with mechanism of action and side effects -Hypoglycemia management: rule of 15's, Glucagon  Emergency Kit and medical alert ID -low-carb low-fat plate-method diet -At least 20 minutes of physical activity per day -Annual dilated retinal eye exam and foot exam -compliance and follow up needs -follow up as scheduled or earlier if problem gets worse  Call if blood sugar is less than 70 or consistently above 250    Take a 15 gm snack of carbohydrate at bedtime before you go to sleep if your blood sugar is less than 100.    If you are going to fast after midnight for a test or procedure, ask your physician for instructions on how to reduce/decrease your insulin  dose.    Call if blood sugar is less than 70 or consistently above 250  -Treating a low sugar by rule of 15  (15 gms of sugar every 15 min until sugar is more than 70) If you feel your sugar is low, test your sugar to be sure If your sugar is low (less than 70), then take 15 grams of a fast acting Carbohydrate (3-4 glucose tablets or glucose gel or 4 ounces of juice or regular soda) Recheck your sugar 15 min after treating low to make sure it is more than 70 If sugar is still less than 70, treat again with 15 grams of carbohydrate          Don't drive the hour of hypoglycemia  If unconscious/unable to eat or drink by mouth, use glucagon  injection or nasal spray baqsimi  and call 911. Can repeat again in  15 min if still unconscious.  Return in about 3 months (around 11/05/2023).   I have reviewed current medications, nurse's notes, allergies, vital signs, past medical and surgical history, family medical history, and social history for this encounter. Counseled patient on symptoms, examination findings, lab findings, imaging results, treatment decisions and monitoring and prognosis. The patient understood the recommendations and agrees with the treatment plan. All questions regarding  treatment plan were fully answered.  Bradley Birmingham, MD  08/05/23    History of Present Illness YECHESKEL KUREK is a 68 y.o. year old male who presents for follow up of Type II diabetes mellitus.  KEATEN MASHEK was first diagnosed in 2008.   Diabetes education +  Current regimen: Farxiga  10 mg daily Ozempic  2 mg weekly  Lantus  15 units qam     Previous history: Non-insulin  hypoglycemic drugs previously used: Farxiga , Actos , Ozempic  Insulin  was started in 2018 when his A1c was 15.6  COMPLICATIONS +  MI/ - stroke -  retinopathy +  neuropathy +  nephropathy  BLOOD SUGAR DATA CGM interpretation: At today's visit, we reviewed her CGM downloads. The full report is scanned in the media. Reviewing the CGM trends, BG are well-controlled across the day with some elevations.  Physical Exam  BP (!) 120/90   Pulse 96   Ht 5' 5 (1.651 m)   Wt 165 lb (74.8 kg)   SpO2 99%   BMI 27.46 kg/m    Constitutional: well developed, well nourished Head: normocephalic, atraumatic Eyes: sclera anicteric, no redness Neck: supple Lungs: normal respiratory effort Neurology: alert and oriented Skin: dry, no appreciable rashes Musculoskeletal: no appreciable defects Psychiatric: normal mood and affect Diabetic Foot Exam - Simple   No data filed      Current Medications Patient's Medications  New Prescriptions   No medications on file  Previous Medications   AMLODIPINE  (NORVASC ) 5 MG TABLET    Take 1 tablet (5 mg total) by mouth daily.   ASPIRIN  EC 81 MG EC TABLET    Take 1 tablet (81 mg total) by mouth daily.   ATORVASTATIN  (LIPITOR ) 80 MG TABLET    Take 1 tablet (80 mg total) by mouth daily.   CARVEDILOL  (COREG ) 12.5 MG TABLET    Take 1 tablet (12.5 mg total) by mouth 2 (two) times daily with a meal.   CLOPIDOGREL  (PLAVIX ) 75 MG TABLET    Take 1 tablet (75 mg total) by mouth daily.   CONTINUOUS GLUCOSE RECEIVER (DEXCOM G7 RECEIVER) DEVI    1 Device by Does not apply route  continuous.   CONTINUOUS GLUCOSE SENSOR (DEXCOM G7 SENSOR) MISC    1 Device by Does not apply route continuous.   DAPAGLIFLOZIN  PROPANEDIOL (FARXIGA ) 10 MG TABS TABLET    Take 1 tablet (10 mg total) by mouth daily before breakfast.   GLIPIZIDE  (GLUCOTROL  XL) 2.5 MG 24 HR TABLET    Take 1 tablet (2.5 mg total) by mouth daily with breakfast.   GLUCAGON  (BAQSIMI  ONE PACK) 3 MG/DOSE POWD    Place 1 Device into the nose as needed (Low blood sugar with impaired consciousness).   HYDROCHLOROTHIAZIDE  (HYDRODIURIL ) 25 MG TABLET    Take 0.5 tablets (12.5 mg total) by mouth daily.   ISOSORBIDE  MONONITRATE (IMDUR ) 60 MG 24 HR TABLET    TAKE 1 & 1/2 (ONE & ONE-HALF) TABLETS BY MOUTH ONCE DAILY .   LANTUS  SOLOSTAR 100 UNIT/ML SOLOSTAR PEN    INJECT 15 UNITS SUBCUTANEOUSLY IN THE MORNING   LISINOPRIL  (  ZESTRIL ) 20 MG TABLET    TAKE 1 TABLET BY MOUTH ONCE DAILY . APPOINTMENT REQUIRED FOR FUTURE REFILLS   MULTIPLE VITAMIN (MULTIVITAMIN WITH MINERALS) TABS TABLET    Take 1 tablet by mouth daily.   OZEMPIC , 2 MG/DOSE, 8 MG/3ML SOPN    INJECT 2 MG INTO THE SKIN ONCE A WEEK  Modified Medications   No medications on file  Discontinued Medications   No medications on file    Allergies Allergies  Allergen Reactions   Ambien  [Zolpidem  Tartrate] Other (See Comments)    Patient stated this medication made him go crazy   Metformin  And Related Diarrhea    Past Medical History Past Medical History:  Diagnosis Date   Asthma    CAD (coronary artery disease)    a. non obst dz by cath 2004  b. 11/12/15 which showed no obvious large vessel CAD. No PCI or clear reason for NSTEMI ( possibly hypertensive urgency?).    CARDIOMYOPATHY    a. NICM, LVEF 30% 01/2011 echo; 20-25% 07/2012 echo, EF recovered by echo 06/2015.   Congestive heart failure (CHF) (HCC)    DIABETES MELLITUS, TYPE II    HYPERCHOLESTEROLEMIA    HYPERTENSION    Myocardial infarct (HCC) 10/2015   Noncompliance    Prostate cancer (HCC)    prostate  cancer   PVCs (premature ventricular contractions)    a. s/p ablation 01-07-2013 by Dr Kelsie    Past Surgical History Past Surgical History:  Procedure Laterality Date   ABLATION  01-07-2013   RVOT PVC's ablated by Dr Kelsie along anteroseptal RVOT   CARDIAC CATHETERIZATION  2004   nonobst dz   CARDIAC CATHETERIZATION N/A 11/12/2015   Procedure: Left Heart Cath and Coronary Angiography;  Surgeon: Ozell Fell, MD;  Location: [facility] INVASIVE CV LAB;  Service: Cardiovascular;  Laterality: N/A;   CHOLECYSTECTOMY N/A 11/16/2013   Procedure: LAPAROSCOPIC CHOLECYSTECTOMY WITH INTRAOPERATIVE CHOLANGIOGRAM;  Surgeon: Morene Olives, MD;  Location: [facility] OR[facility];  Service: General;  Laterality: N/A;   LYMPHADENECTOMY Bilateral 02/22/2014   Procedure: PELVIC LYMPH NODE DISSECTION;  Surgeon: Ricardo Likens, MD;  Location: [facility];  Service: Urology;  Laterality: Bilateral;   PROSTATE SURGERY     ROBOT ASSISTED LAPAROSCOPIC RADICAL PROSTATECTOMY N/A 02/22/2014   Procedure: ROBOTIC ASSISTED LAPAROSCOPIC RADICAL PROSTATECTOMY WITH INDOCYANINE GREEN DYE AND OPEN UMBILICAL HERNIA REPAIR;  Surgeon: Ricardo Likens, MD;  Location: [facility];  Service: Urology;  Laterality: N/A;   SUPRAVENTRICULAR TACHYCARDIA ABLATION N/A 09/28/2012   Procedure: Lore Ablation;  Surgeon: Lynwood Kelsie, MD;  Location: [facility] CATH LAB;  Service: Cardiovascular;  Laterality: N/A;   SURGERY SCROTAL / TESTICULAR     at age 42   V-TACH ABLATION N/A 01/07/2013   Procedure: V-TACH ABLATION;  Surgeon: Lynwood JONETTA Kelsie, MD;  Location: [facility] CATH LAB;  Service: Cardiovascular;  Laterality: N/A;    Family History family history includes Colon cancer in his sister; Diabetes in his mother and son; Heart attack in his paternal grandfather; Heart failure in his maternal grandmother; Hypertension in his mother and another family member; Lung cancer in his brother; Pancreatic cancer in his paternal aunt; Peripheral vascular disease in his mother and another  family member; Prostate cancer in his brother, brother, brother, brother and another family member.  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  Vaping Use   Vaping status: Never Used  Substance and Sexual Activity   Alcohol use: Yes    Comment: rare   Drug use: No   Sexual activity: Never  Other Topics Concern   Not on file  Social History Narrative   Single - divorced x 3 -   Lives with 2 sons, enjoys spending time with 6 g-kids and 3 kids   Social Drivers of Corporate investment banker Strain: Not on file  Food Insecurity: Not on file  Transportation Needs: Not on file  Physical Activity: Not on file  Stress: Not on file  Social Connections: Unknown (03/25/2022)   Received from [employer]   Social Network    Social Network: Not on file  Intimate Partner Violence: Unknown (03/25/2022)   Received from [facility]   HITS    Physically Hurt: Not on file    Insult or Talk Down To: Not on file    Threaten Physical Harm: Not on file    Scream or Curse: Not on file    Lab Results  Component Value Date   HGBA1C 7.0 (A) 06/29/2023   HGBA1C 6.4 (A) 12/16/2022   HGBA1C 5.9 (A) 08/13/2022   Lab Results  Component Value Date   CHOL 98 07/29/2023   Lab Results  Component Value Date   HDL 40 07/29/2023   Lab Results  Component Value Date   LDLCALC 42 07/29/2023   Lab Results  Component Value Date   TRIG 82 07/29/2023   Lab Results  Component Value Date   CHOLHDL 2.5 07/29/2023   Lab Results  Component Value Date   CREATININE 1.49 (H) 07/29/2023   Lab  Results  Component Value Date   GFR 48.78 (L) 08/13/2022   No results found for: MACKEY CURRENT     Component Value Date/Time   NA 139 07/29/2023 0813   NA 142 08/05/2021 1122   K 4.1 07/29/2023 0813   CL 106 07/29/2023 0813   CO2 27 07/29/2023 0813   GLUCOSE 123 (H) 07/29/2023 0813   BUN 16 07/29/2023 0813   BUN 17 08/05/2021 1122   CREATININE 1.49 (H) 07/29/2023 0813   CALCIUM  9.9 07/29/2023 0813   PROT 7.2 07/29/2023 0813   ALBUMIN 3.7 08/13/2022 1032   AST 22 07/29/2023 0813   ALT 28 07/29/2023 0813   ALKPHOS 98 08/13/2022 1032   BILITOT 0.4 07/29/2023 0813   GFRNONAA 37 (L) 04/11/2021 1144   GFRAA 37 (L) 06/24/2019 0631      Latest Ref Rng & Units 07/29/2023    8:13 AM 08/13/2022   10:32 AM 05/09/2022   10:36 AM  BMP  Glucose 65 - 99 mg/dL 876  896  898   BUN 7 - 25 mg/dL 16  16  18    Creatinine 0.70 - 1.35 mg/dL 8.50  8.52  8.49   BUN/Creat Ratio 6 - 22 (calc) 11     Sodium 135 - 146 mmol/L 139  144  141   Potassium 3.5 - 5.3 mmol/L 4.1  4.0  3.8   Chloride 98 - 110 mmol/L 106  110  106   CO2 20 - 32 mmol/L 27  25  27  Calcium  8.6 - 10.3 mg/dL 9.9  9.2  9.4        Component Value Date/Time   WBC 7.7 08/05/2021 1122   WBC 8.4 07/26/2021 1033   RBC 4.55 08/05/2021 1122   RBC 4.62 07/26/2021 1033   HGB 13.5 08/05/2021 1122   HCT 40.7 08/05/2021 1122   PLT 373 08/05/2021 1122   MCV 90 08/05/2021 1122   MCH 29.7 08/05/2021 1122   MCH 30.2 04/11/2021 1144   MCHC 33.2 08/05/2021 1122   MCHC 32.9 07/26/2021 1033   RDW 12.4 08/05/2021 1122   LYMPHSABS 2.5 07/26/2021 1033   MONOABS 0.8 07/26/2021 1033   EOSABS 0.1 07/26/2021 1033   BASOSABS 0.1 07/26/2021 1033     Parts of this note may have been dictated using voice recognition software. There may be variances in spelling and vocabulary which are unintentional. Not all errors are proofread. Please notify the dino if any discrepancies are noted or if the meaning of any statement is not clear.

## 2023-08-06 ENCOUNTER — Encounter: Payer: Self-pay | Admitting: "Endocrinology

## 2023-08-13 ENCOUNTER — Ambulatory Visit (INDEPENDENT_AMBULATORY_CARE_PROVIDER_SITE_OTHER): Admitting: Urology

## 2023-08-13 ENCOUNTER — Encounter: Payer: Self-pay | Admitting: Urology

## 2023-08-13 VITALS — BP 133/89 | HR 93 | Ht 65.0 in | Wt 165.0 lb

## 2023-08-13 DIAGNOSIS — Z8546 Personal history of malignant neoplasm of prostate: Secondary | ICD-10-CM | POA: Diagnosis not present

## 2023-08-13 DIAGNOSIS — N5231 Erectile dysfunction following radical prostatectomy: Secondary | ICD-10-CM | POA: Diagnosis not present

## 2023-08-13 DIAGNOSIS — C61 Malignant neoplasm of prostate: Secondary | ICD-10-CM

## 2023-08-13 DIAGNOSIS — N529 Male erectile dysfunction, unspecified: Secondary | ICD-10-CM | POA: Insufficient documentation

## 2023-08-13 NOTE — Progress Notes (Signed)
 Assessment: 1. 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 Prostate cancer [facility]); pT2cN1Mx, Gleason 7; s/p RALP 2/16     Plan: I personally reviewed the patient's chart including provider notes, lab results. PSA today for evaluation of metastatic prostate cancer. Diagnosis and management options for erectile dysfunction discussed with the patient.  His risk factors for erectile dysfunction include radical prostatectomy (not nerve-sparing), coronary artery disease, hypertension. He is not a candidate for PDE 5 inhibitors due to his use of nitrates. He is interested in intracavernosal injections. Information on injection technique provided to the patient. [facility] Trimix [facility] Return to office for a trial injection in the next few weeks.  Chief Complaint:  Chief Complaint  Patient presents with   Erectile Dysfunction    History of Present Illness:  [name] is a 68 y.o. male who is seen in consultation from Obadiah Birmingham, MD for evaluation of erectile dysfunction. He has been unable to achieve an erection since his prostatectomy in 2016.  He has some sensation but does not have any tumescence.  He has not tried any therapy to date.  He is not a candidate for PDE 5 inhibitors due to his use of isosorbide  mononitrate.  He is also uninterested in a penile prosthesis.  He has a history of prostate cancer.  He underwent a robotic radical prostatectomy with bilateral pelvic lymphadenectomy in February 2016 for pT2cN1Mx Gleason 7 adenocarcinoma with focal positive right lateral margin and left obturator positive node.  Preoperative PSA was 14.8. Postoperative PSA: 6/16 <0.01 9/16 0.01 7/20 0.04 6/23 0.07  He did not undergo any additional treatment for his prostate cancer following the prostatectomy.  He reports no lower urinary tract symptoms.  No problems with urinary incontinence.  Past Medical History:  Past Medical  History:  Diagnosis Date   Asthma    CAD (coronary artery disease)    a. non obst dz by cath 2004  b. 11/12/15 which showed no obvious large vessel CAD. No PCI or clear reason for NSTEMI ( possibly hypertensive urgency?).    CARDIOMYOPATHY    a. NICM, LVEF 30% 01/2011 echo; 20-25% 07/2012 echo, EF recovered by echo 06/2015.   Congestive heart failure (CHF) (HCC)    DIABETES MELLITUS, TYPE II    HYPERCHOLESTEROLEMIA    HYPERTENSION    Myocardial infarct (HCC) 10/2015   Noncompliance    Prostate cancer (HCC)    prostate cancer   PVCs (premature ventricular contractions)    a. s/p ablation 01-07-2013 by Dr Kelsie    Past Surgical History:  Past Surgical History:  Procedure Laterality Date   ABLATION  01-07-2013   RVOT PVC's ablated by Dr Kelsie along anteroseptal RVOT   CARDIAC CATHETERIZATION  2004   nonobst dz   CARDIAC CATHETERIZATION N/A 11/12/2015   Procedure: Left Heart Cath and Coronary Angiography;  Surgeon: Ozell Fell, MD;  Location: [facility] INVASIVE CV LAB;  Service: Cardiovascular;  Laterality: N/A;   CHOLECYSTECTOMY N/A 11/16/2013   Procedure: LAPAROSCOPIC CHOLECYSTECTOMY WITH INTRAOPERATIVE CHOLANGIOGRAM;  Surgeon: Morene Olives, MD;  Location: [facility];  Service: General;  Laterality: N/A;   LYMPHADENECTOMY Bilateral 02/22/2014   Procedure: PELVIC LYMPH NODE DISSECTION;  Surgeon: Ricardo Likens, MD;  Location: [facility];  Service: Urology;  Laterality: Bilateral;   PROSTATE SURGERY     ROBOT ASSISTED LAPAROSCOPIC RADICAL PROSTATECTOMY N/A 02/22/2014   Procedure: ROBOTIC ASSISTED LAPAROSCOPIC RADICAL PROSTATECTOMY WITH INDOCYANINE GREEN DYE AND OPEN UMBILICAL HERNIA REPAIR;  Surgeon:  Ricardo Likens, MD;  Location: [facility];  Service: Urology;  Laterality: N/A;   SUPRAVENTRICULAR TACHYCARDIA ABLATION N/A 09/28/2012   Procedure: Lore Ablation;  Surgeon: Lynwood Rakers, MD;  Location: [facility] CATH LAB;  Service: Cardiovascular;  Laterality: N/A;   SURGERY SCROTAL / TESTICULAR     at age  17   V-TACH ABLATION N/A 01/07/2013   Procedure: V-TACH ABLATION;  Surgeon: Lynwood JONETTA Rakers, MD;  Location: [facility] CATH LAB;  Service: Cardiovascular;  Laterality: N/A;    Allergies:  Allergies  Allergen Reactions   Ambien  [Zolpidem  Tartrate] Other (See Comments)    Patient stated this medication made him go crazy   Metformin  And Related Diarrhea    Family History:  Family History  Problem Relation Age of Onset   Hypertension Mother    Peripheral vascular disease Mother    Diabetes Mother    Colon cancer Sister    Prostate cancer Brother    Lung cancer Brother    Prostate cancer Brother    Prostate cancer Brother    Prostate cancer Brother    Pancreatic cancer Paternal Aunt    Heart failure Maternal Grandmother    Heart attack Paternal Grandfather    Diabetes Son    Peripheral vascular disease Other    Hypertension Other    Prostate cancer Other    Esophageal cancer Neg Hx    Rectal cancer Neg Hx    Stomach cancer Neg Hx    Sleep apnea Neg Hx     Social History:  Social History   Tobacco Use   Smoking status: Never   Smokeless tobacco: Never   Tobacco comments:    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   Vaping status: Never Used  Substance Use Topics   Alcohol use: Yes    Comment: rare   Drug use: No    Review of symptoms:  Constitutional:  Negative for unexplained weight loss, night sweats, fever, chills ENT:  Negative for nose bleeds, sinus pain, painful swallowing CV:  Negative for chest pain, shortness of breath, exercise intolerance, palpitations, loss of consciousness Resp:  Negative for cough, wheezing, shortness of breath GI:  Negative for nausea, vomiting, diarrhea, bloody stools GU:  Positives noted in HPI; otherwise negative for gross hematuria, dysuria, urinary incontinence Neuro:  Negative for seizures, poor balance, limb weakness, slurred speech Psych:  Negative for lack of energy, depression, anxiety Endocrine:   Negative for polydipsia, polyuria, symptoms of hypoglycemia (dizziness, hunger, sweating) Hematologic:  Negative for anemia, purpura, petechia, prolonged or excessive bleeding, use of anticoagulants  Allergic:  Negative for difficulty breathing or choking as a result of exposure to anything; no shellfish allergy; no allergic response (rash/itch) to materials, foods  Physical exam: BP 133/89   Pulse 93   Ht 5' 5 (1.651 m)   Wt 165 lb (74.8 kg)   BMI 27.46 kg/m  GENERAL APPEARANCE:  Well appearing, well developed, well nourished, NAD HEENT: Atraumatic, Normocephalic, oropharynx clear. NECK: Supple without lymphadenopathy or thyromegaly. LUNGS: Clear to auscultation bilaterally. HEART: Regular Rate and Rhythm without murmurs, gallops, or rubs. ABDOMEN: Soft, non-tender, No Masses. EXTREMITIES: Moves all extremities well.  Without clubbing, cyanosis, or edema. NEUROLOGIC:  Alert and oriented x 3, normal gait, CN II-XII grossly intact.  MENTAL STATUS:  Appropriate. BACK:  Non-tender to palpation.  No CVAT SKIN:  Warm, dry and intact.   GU: Penis:  uncircumcised Meatus: Normal Scrotum: normal, no masses Testis: normal without masses bilateral  Results: U/A: 0-5 to BCs, 0-2 RBCs

## 2023-08-14 ENCOUNTER — Ambulatory Visit: Payer: Self-pay | Admitting: Urology

## 2023-08-14 LAB — PSA: Prostate Specific Ag, Serum: 0.1 ng/mL (ref 0.0–4.0)

## 2023-08-21 ENCOUNTER — Other Ambulatory Visit: Payer: Self-pay | Admitting: "Endocrinology

## 2023-08-21 DIAGNOSIS — E1165 Type 2 diabetes mellitus with hyperglycemia: Secondary | ICD-10-CM

## 2023-08-21 NOTE — Telephone Encounter (Signed)
 Refill request complete

## 2023-09-11 ENCOUNTER — Ambulatory Visit: Admitting: Urology

## 2023-09-11 ENCOUNTER — Encounter: Payer: Self-pay | Admitting: Urology

## 2023-09-11 VITALS — BP 161/93 | HR 102 | Ht 65.0 in | Wt 165.0 lb

## 2023-09-11 DIAGNOSIS — N5231 Erectile dysfunction following radical prostatectomy: Secondary | ICD-10-CM

## 2023-09-11 DIAGNOSIS — Z8546 Personal history of malignant neoplasm of prostate: Secondary | ICD-10-CM

## 2023-09-11 DIAGNOSIS — C61 Malignant neoplasm of prostate: Secondary | ICD-10-CM

## 2023-09-11 MED ORDER — INSULIN SYRINGE-NEEDLE U-100 30G X 1/2" 1 ML MISC: 5 refills | Status: AC

## 2023-09-11 NOTE — Progress Notes (Signed)
 Assessment: 1. 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 Prostate cancer [facility]); pT2cN1Mx, Gleason 7; s/p RALP 2/16     Plan: Information on injection technique again provided to the patient. Begin TriMix 10/30/1 0.2 ml prn as directed. Advised patient he may increase dose to 0.5 ml as needed. Return to office in 3 months Repeat PSA next visit  Chief Complaint:  Chief Complaint  Patient presents with   Erectile Dysfunction    History of Present Illness:  [name] is a 68 y.o. male who is seen for further evaluation of erectile dysfunction. He has been unable to achieve an erection since his prostatectomy in 2016.  He has some sensation but does not have any tumescence.  He has not tried any therapy to date.  He is not a candidate for PDE 5 inhibitors due to his use of isosorbide mononitrate.  He is also uninterested in a penile prosthesis.  He has a history of prostate cancer.  He underwent a robotic radical prostatectomy with bilateral pelvic lymphadenectomy in February 2016 for pT2cN1Mx Gleason 7 adenocarcinoma with focal positive right lateral margin and left obturator positive node.  Preoperative PSA was 14.8. Postoperative PSA: 6/16 <0.01 9/16 0.01 7/20 0.04 6/23 0.07 7/25 0.1  He did not undergo any additional treatment for his prostate cancer following the prostatectomy.  He reports no lower urinary tract symptoms.  No problems with urinary incontinence.  He presents today for trial injection of TriMix.  Portions of the above documentation were copied from a prior visit for review purposes only.   Past Medical History:  Past Medical History:  Diagnosis Date   Asthma    CAD (coronary artery disease)    a. non obst dz by cath 2004  b. 11/12/15 which showed no obvious large vessel CAD. No PCI or clear reason for NSTEMI ( possibly hypertensive urgency?).    CARDIOMYOPATHY    a. NICM, LVEF 30% 01/2011 echo; 20-25% 07/2012 echo, EF recovered by  echo 06/2015.   Congestive heart failure (CHF) (HCC)    DIABETES MELLITUS, TYPE II    HYPERCHOLESTEROLEMIA    HYPERTENSION    Myocardial infarct (HCC) 10/2015   Noncompliance    Prostate cancer (HCC)    prostate cancer   PVCs (premature ventricular contractions)    a. s/p ablation 01-07-2013 by Dr Kelsie    Past Surgical History:  Past Surgical History:  Procedure Laterality Date   ABLATION  01-07-2013   RVOT PVC's ablated by Dr Kelsie along anteroseptal RVOT   CARDIAC CATHETERIZATION  2004   nonobst dz   CARDIAC CATHETERIZATION N/A 11/12/2015   Procedure: Left Heart Cath and Coronary Angiography;  Surgeon: Ozell Fell, MD;  Location: [facility] INVASIVE CV LAB;  Service: Cardiovascular;  Laterality: N/A;   CHOLECYSTECTOMY N/A 11/16/2013   Procedure: LAPAROSCOPIC CHOLECYSTECTOMY WITH INTRAOPERATIVE CHOLANGIOGRAM;  Surgeon: Morene Olives, MD;  Location: [facility] ORS;  Service: General;  Laterality: N/A;   LYMPHADENECTOMY Bilateral 02/22/2014   Procedure: PELVIC LYMPH NODE DISSECTION;  Surgeon: Ricardo Likens, MD;  Location: [facility] ORS;  Service: Urology;  Laterality: Bilateral;   PROSTATE SURGERY     ROBOT ASSISTED LAPAROSCOPIC RADICAL PROSTATECTOMY N/A 02/22/2014   Procedure: ROBOTIC ASSISTED LAPAROSCOPIC RADICAL PROSTATECTOMY WITH INDOCYANINE GREEN DYE AND OPEN UMBILICAL HERNIA REPAIR;  Surgeon: Ricardo Likens, MD;  Location: [facility] ORS;  Service: Urology;  Laterality: N/A;   SUPRAVENTRICULAR TACHYCARDIA ABLATION N/A 09/28/2012   Procedure: Lore Ablation;  Surgeon: Lynwood Kelsie, MD;  Location: [facility] CATH LAB;  Service: Cardiovascular;  Laterality: N/A;   SURGERY SCROTAL / TESTICULAR     at age 29   V-TACH ABLATION N/A 01/07/2013   Procedure: V-TACH ABLATION;  Surgeon: Lynwood JONETTA Rakers, MD;  Location: [facility] CATH LAB;  Service: Cardiovascular;  Laterality: N/A;    Allergies:  Allergies  Allergen Reactions   Ambien [Zolpidem Tartrate] Other (See Comments)    Patient stated this medication made him go  crazy   Metformin And Related Diarrhea    Family History:  Family History  Problem Relation Age of Onset   Hypertension Mother    Peripheral vascular disease Mother    Diabetes Mother    Colon cancer Sister    Prostate cancer Brother    Lung cancer Brother    Prostate cancer Brother    Prostate cancer Brother    Prostate cancer Brother    Pancreatic cancer Paternal Aunt    Heart failure Maternal Grandmother    Heart attack Paternal Grandfather    Diabetes Son    Peripheral vascular disease Other    Hypertension Other    Prostate cancer Other    Esophageal cancer Neg Hx    Rectal cancer Neg Hx    Stomach cancer Neg Hx    Sleep apnea Neg Hx     Social History:  Social History   Tobacco Use   Smoking status: Never   Smokeless tobacco: Never   Tobacco comments:    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   Vaping status: Never Used  Substance Use Topics   Alcohol use: Yes    Comment: rare   Drug use: No    ROS: Constitutional:  Negative for fever, chills, weight loss CV: Negative for chest pain, previous MI, hypertension Respiratory:  Negative for shortness of breath, wheezing, sleep apnea, frequent cough GI:  Negative for nausea, vomiting, bloody stool, GERD  Physical exam: BP (!) 161/93   Pulse (!) 102   Ht 5' 5 (1.651 m)   Wt 165 lb (74.8 kg)   BMI 27.46 kg/m  GENERAL APPEARANCE:  Well appearing, well developed, well nourished, NAD   Results: None  Procedure:  Penile Injection  Under sterile conditions 0.2 milliliters of triple mix standard is injected into the penile corporeal body.  The patient is then examined after 10 minutes.  The injection  induces a partial erection unsatisfactory for vaginal penetration.  The patient is instructed on proper injection technique under sterile conditions.  Most importantly he realizes if he ever has a painful prolonged erection also known as a priapism that he should seek care within  three hours.  Failure to seek care could lead to permanent penile damage, penile fibrosis, penile gangrene, or possible loss of penis.  He reports good good understanding.  All questions are answered to his satisfaction.

## 2023-09-24 ENCOUNTER — Encounter: Payer: BC Managed Care – PPO | Admitting: Family

## 2023-10-16 ENCOUNTER — Other Ambulatory Visit: Payer: Self-pay | Admitting: Family

## 2023-10-22 ENCOUNTER — Emergency Department (HOSPITAL_COMMUNITY)
Admission: EM | Admit: 2023-10-22 | Discharge: 2023-10-22 | Disposition: A | Discharge status: Home / Self Care | Attending: Emergency Medicine | Admitting: Emergency Medicine

## 2023-10-22 ENCOUNTER — Emergency Department (HOSPITAL_COMMUNITY)

## 2023-10-22 ENCOUNTER — Other Ambulatory Visit: Payer: Self-pay

## 2023-10-22 DIAGNOSIS — Z7901 Long term (current) use of anticoagulants: Secondary | ICD-10-CM | POA: Diagnosis not present

## 2023-10-22 DIAGNOSIS — Z7982 Long term (current) use of aspirin: Secondary | ICD-10-CM | POA: Insufficient documentation

## 2023-10-22 DIAGNOSIS — R0789 Other chest pain: Secondary | ICD-10-CM | POA: Insufficient documentation

## 2023-10-22 DIAGNOSIS — M542 Cervicalgia: Secondary | ICD-10-CM | POA: Insufficient documentation

## 2023-10-22 DIAGNOSIS — I2510 Atherosclerotic heart disease of native coronary artery without angina pectoris: Secondary | ICD-10-CM | POA: Insufficient documentation

## 2023-10-22 LAB — CBC
HCT: 45.8 % (ref 39.0–52.0)
Hemoglobin: 14.7 g/dL (ref 13.0–17.0)
MCH: 28.8 pg (ref 26.0–34.0)
MCHC: 32.1 g/dL (ref 30.0–36.0)
MCV: 89.8 fL (ref 80.0–100.0)
Platelets: 292 K/uL (ref 150–400)
RBC: 5.1 MIL/uL (ref 4.22–5.81)
RDW: 12.6 % (ref 11.5–15.5)
WBC: 8.7 K/uL (ref 4.0–10.5)
nRBC: 0 % (ref 0.0–0.2)

## 2023-10-22 LAB — BASIC METABOLIC PANEL WITH GFR
Anion gap: 14 (ref 5–15)
BUN: 16 mg/dL (ref 8–23)
CO2: 21 mmol/L — ABNORMAL LOW (ref 22–32)
Calcium: 9.7 mg/dL (ref 8.9–10.3)
Chloride: 105 mmol/L (ref 98–111)
Creatinine, Ser: 1.37 mg/dL — ABNORMAL HIGH (ref 0.61–1.24)
GFR, Estimated: 56 mL/min — ABNORMAL LOW (ref >60)
Glucose, Bld: 86 mg/dL (ref 70–99)
Potassium: 3.5 mmol/L (ref 3.5–5.1)
Sodium: 140 mmol/L (ref 135–145)

## 2023-10-22 LAB — TROPONIN T, HIGH SENSITIVITY: Troponin T High Sensitivity: <15 ng/L (ref 0–19)

## 2023-10-22 NOTE — ED Provider Notes (Signed)
 [place] EMERGENCY DEPARTMENT AT [facility] Provider Note   CSN: [id number] Arrival date & time: 10/22/23  1446     Patient presents with: Neck Pain   PHU RECORD is a 68 y.o. male presented to ED with complaint of neck pain and chest fluttering.  Patient has noticed this on and off for the past 2 days.  It is not associated with exertion.  No fevers or shortness of breath or coughing.  He denies any arm numbness or weakness.  He reports he has chronic fatigue and does not ambulate very much at baseline.  LHC in 2017 showed 25-30% CAD but no significant obstruction - pt had NSTEMI at the time   HPI     Prior to Admission medications   Medication Sig Start Date End Date Taking? Authorizing Provider  amLODipine  (NORVASC ) 5 MG tablet Take 1 tablet (5 mg total) by mouth daily. 12/11/22   Jason Leita Repine, FNP  aspirin  EC 81 MG EC tablet Take 1 tablet (81 mg total) by mouth daily. 11/14/15   Sebastian Lamarr SAUNDERS, PA-C  atorvastatin  (LIPITOR ) 80 MG tablet Take 1 tablet by mouth once daily 10/16/23   Jason Leita Repine, FNP  carvedilol  (COREG ) 12.5 MG tablet Take 1 tablet (12.5 mg total) by mouth 2 (two) times daily with a meal. 09/23/22   Jason Leita Repine, FNP  clopidogrel  (PLAVIX ) 75 MG tablet Take 1 tablet (75 mg total) by mouth daily. 09/23/22   Jason Leita Repine, FNP  Continuous Glucose Receiver (DEXCOM G7 RECEIVER) DEVI 1 Device by Does not apply route continuous. 04/29/23   Motwani, Komal, MD  Continuous Glucose Sensor (DEXCOM G7 SENSOR) MISC 1 Device by Does not apply route continuous. 04/29/23   Motwani, Komal, MD  dapagliflozin  propanediol (FARXIGA ) 10 MG TABS tablet Take 1 tablet (10 mg total) by mouth daily before breakfast. 04/30/23   Dartha Ernst, MD  glipiZIDE  (GLUCOTROL  XL) 2.5 MG 24 hr tablet Take 1 tablet (2.5 mg total) by mouth daily with breakfast. 06/29/23   Motwani, Ernst, MD  Glucagon  (BAQSIMI  ONE PACK) 3 MG/DOSE POWD Place 1 Device into  the nose as needed (Low blood sugar with impaired consciousness). 09/22/22   Motwani, Komal, MD  hydrochlorothiazide  (HYDRODIURIL ) 25 MG tablet Take 0.5 tablets (12.5 mg total) by mouth daily. 06/19/23   Jason Leita Repine, FNP  Insulin  Syringe-Needle U-100 30G X 1/2 1 ML MISC Use as directed 09/11/23   Stoneking, Adine PARAS., MD  isosorbide  mononitrate (IMDUR ) 60 MG 24 hr tablet TAKE 1 & 1/2 (ONE & ONE-HALF) TABLETS BY MOUTH ONCE DAILY . 12/30/22   Jeffrie Oneil BROCKS, MD  LANTUS  SOLOSTAR 100 UNIT/ML Solostar Pen INJECT 15 UNITS SUBCUTANEOUSLY IN THE MORNING 07/21/23   Motwani, Ernst, MD  lisinopril  (ZESTRIL ) 20 MG tablet TAKE 1 TABLET BY MOUTH ONCE DAILY . APPOINTMENT REQUIRED FOR FUTURE REFILLS 12/22/22   Jeffrie Oneil BROCKS, MD  Multiple Vitamin (MULTIVITAMIN WITH MINERALS) TABS tablet Take 1 tablet by mouth daily. 02/10/19   Hongalgi, Trenda BIRCH, MD  OZEMPIC , 2 MG/DOSE, 8 MG/3ML SOPN INJECT 2 MG INTO THE SKIN ONCE A WEEK 08/21/23   Motwani, Komal, MD    Allergies: Ambien  [zolpidem  tartrate] and Metformin  and related    Review of Systems  Updated Vital Signs BP (!) 148/90   Pulse 75   Temp 98.2 F (36.8 C)   Resp 18   SpO2 97%   Physical Exam Constitutional:      General: He is not in  acute distress. HENT:     Head: Normocephalic and atraumatic.  Eyes:     Conjunctiva/sclera: Conjunctivae normal.     Pupils: Pupils are equal, round, and reactive to light.  Cardiovascular:     Rate and Rhythm: Normal rate and regular rhythm.  Pulmonary:     Effort: Pulmonary effort is normal. No respiratory distress.  Abdominal:     General: There is no distension.     Tenderness: There is no abdominal tenderness.  Skin:    General: Skin is warm and dry.  Neurological:     General: No focal deficit present.     Mental Status: He is alert. Mental status is at baseline.  Psychiatric:        Mood and Affect: Mood normal.        Behavior: Behavior normal.     (all labs ordered are listed, but only  abnormal results are displayed) Labs Reviewed  BASIC METABOLIC PANEL WITH GFR - Abnormal; Notable for the following components:      Result Value   CO2 21 (*)    Creatinine, Ser 1.37 (*)    GFR, Estimated 56 (*)    All other components within normal limits  CBC  TROPONIN T, HIGH SENSITIVITY    EKG: EKG Interpretation Date/Time:  Thursday October 22 2023 14:53:37 EDT Ventricular Rate:  91 PR Interval:  188 QRS Duration:  124 QT Interval:  384 QTC Calculation: 472 R Axis:   -51  Text Interpretation: Normal sinus rhythm Left anterior fascicular block Left ventricular hypertrophy with QRS widening ( R in aVL , Cornell product , Romhilt-Estes ) Nonspecific ST abnormality Abnormal ECG When compared with ECG of 04-Dec-2022 13:31, PREVIOUS ECG IS PRESENT agree, similar to previous Confirmed by Armenta Canning [id number]) on 10/22/2023 2:59:11 PM  Radiology: [facility] Chest Port 1 View Result Date: 10/22/2023 EXAM: 1 VIEW(S) XRAY OF THE CHEST 10/22/2023 06:09:00 PM COMPARISON: Comparison 04/11/2021. CLINICAL HISTORY: neck pain. Pt with complaints of posterior neck pain, and feeling a fluttering feeling in his chest. PT states that the sx began 3 days ago, and have persisted. Pt states that he thinks his sx could be anxiety; Hx of CAD, MI, CHF, DM, prostate cancer FINDINGS: LUNGS AND PLEURA: No focal pulmonary opacity. No pulmonary edema. No pleural effusion. No pneumothorax. HEART AND MEDIASTINUM: No acute abnormality of the cardiac and mediastinal silhouettes. BONES AND SOFT TISSUES: No acute osseous abnormality. Right upper quadrant surgical clips noted. IMPRESSION: 1. No acute abnormalities. Electronically signed by: Pinkie Pebbles MD 10/22/2023 06:30 PM EDT RP Workstation: [id number]     Procedures   Medications Ordered in the ED - No data to display                                  Medical Decision Making  This patient presents to the ED with concern for fluttering and neck pain. This  involves an extensive number of treatment options, and is a complaint that carries with it a high risk of complications and morbidity.  The differential diagnosis includes ACS versus arrhythmia versus pneumonia versus pneumothorax versus musculoskeletal pain versus other  Co-morbidities that complicate the patient evaluation: Cardiovascular risk factors including high blood pressure, high cholesterol, coronary disease  External records from outside source obtained and reviewed including left heart catheterization report as noted  I ordered and personally interpreted labs.  The pertinent results include: No emergent findings.  Undetectable troponin  with 2 days of symptoms ongoing - per ACEP clinical guidelines, reasonable to proceed without repeat troponin testing.  I ordered imaging studies including x-ray of the chest I independently visualized and interpreted imaging which showed no emergent findings I agree with the radiologist interpretation  The patient was maintained on a cardiac monitor.  I personally viewed and interpreted the cardiac monitored which showed an underlying rhythm of: NSR  Per my interpretation the patient's ECG shows sinus rhythm with incomplete left bundle branch block, unchanged to prior imaging, no acute ischemic finding  I have reviewed the patients home medicines and have made adjustments as needed  Test Considered: Low suspicion for acute PE, aortic dissection.  No widened mediastinum on chest x-ray.  No acute risk factors for PE.  No tachycardia or hypoxia.  Patient remains minimally symptomatic after nearly 5 hours in the ED if no evidence of worsening symptoms.  Low suspicion for ACS or aortic dissection or life-threatening condition, ever given his coronary and cardiac history, I do think referring him back to see his cardiologist would be reasonable.  The patient and his family are in agreement and comfortable with this plan.  Okay for discharge.  Strict return  precautions provided.  Disposition:  After consideration of the diagnostic results and the patient's response to treatment, I feel that the patient would benefit from close outpatient follow-up      Final diagnoses:  Chest discomfort    ED Discharge Orders     None          Brentton Wardlow, Donnice PARAS, MD 10/22/23 2003

## 2023-10-22 NOTE — ED Triage Notes (Signed)
 Pt ambulatory to triage with complaints of posterior neck pain, and feeling a fluttering feeling in his chest. PT states that the sx began 3 days ago, and have persisted. Pt states that he thinks his sx could be anxiety, but endorses a hx of MI.

## 2023-10-25 ENCOUNTER — Other Ambulatory Visit: Payer: Self-pay | Admitting: "Endocrinology

## 2023-10-26 NOTE — Telephone Encounter (Signed)
 Requested Prescriptions   Pending Prescriptions Disp Refills   FARXIGA  10 MG TABS tablet [Pharmacy Med Name: Farxiga  10 MG Oral Tablet] 90 tablet 0    Sig: TAKE 1 TABLET BY MOUTH ONCE DAILY BEFORE BREAKFAST

## 2023-11-08 ENCOUNTER — Other Ambulatory Visit: Payer: Self-pay | Admitting: "Endocrinology

## 2023-11-08 DIAGNOSIS — E1165 Type 2 diabetes mellitus with hyperglycemia: Secondary | ICD-10-CM

## 2023-11-10 ENCOUNTER — Encounter: Payer: Self-pay | Admitting: Family Medicine

## 2023-11-10 ENCOUNTER — Ambulatory Visit (INDEPENDENT_AMBULATORY_CARE_PROVIDER_SITE_OTHER): Admitting: Family Medicine

## 2023-11-10 VITALS — BP 122/85 | HR 94 | Ht 65.0 in | Wt 165.0 lb

## 2023-11-10 DIAGNOSIS — E1169 Type 2 diabetes mellitus with other specified complication: Secondary | ICD-10-CM

## 2023-11-10 DIAGNOSIS — Z23 Encounter for immunization: Secondary | ICD-10-CM

## 2023-11-10 DIAGNOSIS — Z0000 Encounter for general adult medical examination without abnormal findings: Secondary | ICD-10-CM | POA: Diagnosis not present

## 2023-11-10 LAB — COMPREHENSIVE METABOLIC PANEL WITH GFR
ALT: 19 U/L (ref 0–53)
AST: 21 U/L (ref 0–37)
Albumin: 4.2 g/dL (ref 3.5–5.2)
Alkaline Phosphatase: 101 U/L (ref 39–117)
BUN: 18 mg/dL (ref 6–23)
CO2: 25 meq/L (ref 19–32)
Calcium: 9.6 mg/dL (ref 8.4–10.5)
Chloride: 107 meq/L (ref 96–112)
Creatinine, Ser: 1.82 mg/dL — ABNORMAL HIGH (ref 0.40–1.50)
GFR: 37.43 mL/min — ABNORMAL LOW (ref >60.00)
Glucose, Bld: 123 mg/dL — ABNORMAL HIGH (ref 70–99)
Potassium: 4.1 meq/L (ref 3.5–5.1)
Sodium: 141 meq/L (ref 135–145)
Total Bilirubin: 0.6 mg/dL (ref 0.2–1.2)
Total Protein: 7.3 g/dL (ref 6.0–8.3)

## 2023-11-10 MED ORDER — ATORVASTATIN CALCIUM 80 MG PO TABS: 80.0000 mg | ORAL_TABLET | Freq: Every day | ORAL | 2 refills | Status: AC

## 2023-11-10 NOTE — Progress Notes (Signed)
 Complete physical exam  Patient: [name]   DOB: 08-May-1955   68 y.o. Male  MRN: [id number]  Subjective:    Chief Complaint  Patient presents with   Establish Care    [name] is a 68 y.o. male who presents today for a complete physical exam. He reports consuming a general diet. The patient does not participate in regular exercise at present. He generally feels well. He reports sleeping fairly well. He does not have additional problems to discuss today.   Currently lives with: sister Acute concerns or interim problems since last visit: none  Vision concerns: no Dental concerns: no   ETOH use: rare Nicotine use: no Recreational drugs/illegal substances: no         Most recent fall risk assessment:    11/10/2023   10:27 AM  Fall Risk   Falls in the past year? 0  Number falls in past yr: 0  Injury with Fall? 0  Risk for fall due to : No Fall Risks  Follow up Falls evaluation completed     Most recent depression screenings:    11/10/2023   10:27 AM 10/29/2021   10:07 AM  PHQ 2/9 Scores  PHQ - 2 Score 0 0  PHQ- 9 Score 2             Patient Care Team: Almarie Waddell NOVAK, NP as PCP - General (Family Medicine) Jeffrie Oneil BROCKS, MD as PCP - Cardiology (Cardiology) Lavona Agent, MD (Cardiology) Clance, Francis HERO, MD as Consulting Physician (Pulmonary Disease) Bensimhon, Toribio SAUNDERS, MD (Cardiology)   Outpatient Medications Prior to Visit  Medication Sig   amLODipine  (NORVASC ) 5 MG tablet Take 1 tablet (5 mg total) by mouth daily.   aspirin  EC 81 MG EC tablet Take 1 tablet (81 mg total) by mouth daily.   atorvastatin  (LIPITOR ) 80 MG tablet Take 1 tablet by mouth once daily   carvedilol  (COREG ) 12.5 MG tablet Take 1 tablet (12.5 mg total) by mouth 2 (two) times daily with a meal.   clopidogrel  (PLAVIX ) 75 MG tablet Take 1 tablet (75 mg total) by mouth daily.   Continuous Glucose Receiver (DEXCOM G7 RECEIVER) DEVI 1 Device by Does not apply  route continuous.   Continuous Glucose Sensor (DEXCOM G7 SENSOR) MISC 1 Device by Does not apply route continuous.   FARXIGA  10 MG TABS tablet TAKE 1 TABLET BY MOUTH ONCE DAILY BEFORE BREAKFAST   glipiZIDE  (GLUCOTROL  XL) 2.5 MG 24 hr tablet Take 1 tablet (2.5 mg total) by mouth daily with breakfast.   Glucagon  (BAQSIMI  ONE PACK) 3 MG/DOSE POWD Place 1 Device into the nose as needed (Low blood sugar with impaired consciousness).   hydrochlorothiazide  (HYDRODIURIL ) 25 MG tablet Take 0.5 tablets (12.5 mg total) by mouth daily.   Insulin  Syringe-Needle U-100 30G X 1/2 1 ML MISC Use as directed   isosorbide  mononitrate (IMDUR ) 60 MG 24 hr tablet TAKE 1 & 1/2 (ONE & ONE-HALF) TABLETS BY MOUTH ONCE DAILY .   LANTUS  SOLOSTAR 100 UNIT/ML Solostar Pen INJECT 15 UNITS SUBCUTANEOUSLY IN THE MORNING   lisinopril  (ZESTRIL ) 20 MG tablet TAKE 1 TABLET BY MOUTH ONCE DAILY . APPOINTMENT REQUIRED FOR FUTURE REFILLS   Multiple Vitamin (MULTIVITAMIN WITH MINERALS) TABS tablet Take 1 tablet by mouth daily.   OZEMPIC , 2 MG/DOSE, 8 MG/3ML SOPN INJECT 2 MG INTO THE SKIN ONCE A WEEK   No facility-administered medications prior to visit.    ROS All review of systems negative except  what is listed in the HPI        Objective:     BP 122/85   Pulse 94   Ht 5' 5 (1.651 m)   Wt 165 lb (74.8 kg)   SpO2 100%   BMI 27.46 kg/m    Physical Exam Vitals reviewed.  Constitutional:      General: He is not in acute distress.    Appearance: Normal appearance. He is not ill-appearing.  HENT:     Head: Normocephalic and atraumatic.     Right Ear: Tympanic membrane normal.     Left Ear: Tympanic membrane normal.     Nose: Nose normal.     Mouth/Throat:     Mouth: Mucous membranes are moist.     Pharynx: Oropharynx is clear.  Eyes:     Extraocular Movements: Extraocular movements intact.     Conjunctiva/sclera: Conjunctivae normal.     Pupils: Pupils are equal, round, and reactive to light.  Cardiovascular:      Rate and Rhythm: Normal rate and regular rhythm.     Pulses: Normal pulses.     Heart sounds: Normal heart sounds.  Pulmonary:     Effort: Pulmonary effort is normal.     Breath sounds: Normal breath sounds.  Abdominal:     General: Abdomen is flat. Bowel sounds are normal. There is no distension.     Palpations: Abdomen is soft. There is no mass.     Tenderness: There is no abdominal tenderness. There is no right CVA tenderness, left CVA tenderness, guarding or rebound.  Genitourinary:    Comments: Deferred exam Musculoskeletal:        General: Normal range of motion.     Cervical back: Normal range of motion and neck supple. No tenderness.     Right lower leg: No edema.     Left lower leg: No edema.  Lymphadenopathy:     Cervical: No cervical adenopathy.  Skin:    General: Skin is warm and dry.     Capillary Refill: Capillary refill takes less than 2 seconds.  Neurological:     General: No focal deficit present.     Mental Status: He is alert and oriented to person, place, and time. Mental status is at baseline.  Psychiatric:        Mood and Affect: Mood normal.        Behavior: Behavior normal.        Thought Content: Thought content normal.        Judgment: Judgment normal.         No results found for any visits on 11/10/23.     Assessment & Plan:    Routine Health Maintenance and Physical Exam Discussed health promotion and safety including diet and exercise recommendations, dental health, and injury prevention. Tobacco cessation if applicable. Seat belts, sunscreen, smoke detectors, etc.    Immunization History  Administered Date(s) Administered   INFLUENZA, HIGH DOSE SEASONAL PF 11/14/2022, 11/10/2023   Influenza Split 02/14/2011   Influenza Whole 10/27/2008   Influenza,inj,Quad PF,6+ Mos 11/17/2013, 10/25/2014, 11/12/2015, 10/15/2016, 03/12/2018, 11/22/2018   PFIZER(Purple Top)SARS-COV-2 Vaccination 04/21/2019, 05/16/2019, 01/12/2020   PNEUMOCOCCAL  CONJUGATE-20 07/26/2021   Pfizer(Comirnaty)Fall Seasonal Vaccine 12 years and older 11/14/2022   Pneumococcal Polysaccharide-23 02/14/2011, 11/17/2013, 02/08/2019   Td 04/25/2009   Tdap 03/12/2020   Zoster Recombinant(Shingrix) 09/24/2021, 11/14/2022    Health Maintenance  Topic Date Due   Medicare Annual Wellness (AWV)  Never done   OPHTHALMOLOGY EXAM  Never  done   Diabetic kidney evaluation - Urine ACR  03/26/2023   HEMOGLOBIN A1C  12/29/2023   FOOT EXAM  06/28/2024   Diabetic kidney evaluation - eGFR measurement  10/21/2024   Colonoscopy  07/13/2025   DTaP/Tdap/Td (3 - Td or Tdap) 03/12/2030   Pneumococcal Vaccine: 50+ Years  Completed   Influenza Vaccine  Completed   Hepatitis C Screening  Completed   Zoster Vaccines- Shingrix  Completed   Meningococcal B Vaccine  Aged Out   COVID-19 Vaccine  Discontinued        Problem List Items Addressed This Visit   None Visit Diagnoses       Annual physical exam    -  Primary   Relevant Orders   Comprehensive metabolic panel with GFR     Encounter for medical examination to establish care         Immunization due       Relevant Orders   Flu vaccine HIGH DOSE PF(Fluzone Trivalent) (Completed)          PATIENT COUNSELING:     Recommend that most people either abstain from alcohol or drink within safe limits (<=14/week and <=4 drinks/occasion for males, <=7/weeks and <= 3 drinks/occasion for females) and that the risk for alcohol disorders and other health effects rises proportionally with the number of drinks per week and how often a drinker exceeds daily limits.   Diet: Recommend to adjust caloric intake to maintain or achieve ideal body weight, to reduce intake of dietary saturated fat and total fat, to limit sodium intake by avoiding high sodium foods and not adding table salt, and to maintain adequate dietary potassium and calcium  preferably from fresh fruits, vegetables, and low-fat dairy products.   Emphasized the  importance of regular exercise.  Injury prevention: Recommend seatbelts, safety helmets, smoke detector, etc..   Dental health: Recommend regular tooth brushing, flossing, and dental visits.       Return in about 6 months (around 05/10/2024) for chronic disease management.     Waddell KATHEE Mon, NP

## 2023-11-10 NOTE — Addendum Note (Signed)
 Addended by: Marcellas Marchant, VERMELL L on: 11/10/2023 02:00 PM   Modules accepted: Orders

## 2023-11-11 ENCOUNTER — Ambulatory Visit: Admitting: "Endocrinology

## 2023-11-11 ENCOUNTER — Ambulatory Visit: Payer: Self-pay | Admitting: Family Medicine

## 2023-11-11 DIAGNOSIS — R944 Abnormal results of kidney function studies: Secondary | ICD-10-CM

## 2023-11-26 ENCOUNTER — Ambulatory Visit (INDEPENDENT_AMBULATORY_CARE_PROVIDER_SITE_OTHER): Admitting: "Endocrinology

## 2023-11-26 ENCOUNTER — Other Ambulatory Visit

## 2023-11-26 ENCOUNTER — Encounter: Payer: Self-pay | Admitting: "Endocrinology

## 2023-11-26 VITALS — BP 104/80 | HR 92 | Ht 65.0 in | Wt 163.0 lb

## 2023-11-26 DIAGNOSIS — E7800 Pure hypercholesterolemia, unspecified: Secondary | ICD-10-CM

## 2023-11-26 DIAGNOSIS — E1165 Type 2 diabetes mellitus with hyperglycemia: Secondary | ICD-10-CM

## 2023-11-26 DIAGNOSIS — Z7985 Long-term (current) use of injectable non-insulin antidiabetic drugs: Secondary | ICD-10-CM

## 2023-11-26 DIAGNOSIS — Z7984 Long term (current) use of oral hypoglycemic drugs: Secondary | ICD-10-CM

## 2023-11-26 LAB — POCT GLYCOSYLATED HEMOGLOBIN (HGB A1C): Hemoglobin A1C: 6.3 % — AB (ref 4.0–5.6)

## 2023-11-26 MED ORDER — OZEMPIC (2 MG/DOSE) 8 MG/3ML ~~LOC~~ SOPN: 2.0000 mg | PEN_INJECTOR | SUBCUTANEOUS | 3 refills | Status: AC

## 2023-11-26 NOTE — Patient Instructions (Signed)
 Goals of DM therapy:  Morning Fasting blood sugar: 80-140  Blood sugar before meals: 80-140 Bed time blood sugar: 100-150  A1C <7%, limited only by hypoglycemia  1.Diabetes medications and their side effects discussed, including hypoglycemia    2. Check blood glucose:  a) Always check blood sugars before driving. Please see below (under hypoglycemia) on how to manage b) Check a minimum of 3 times/day or more as needed when having symptoms of hypoglycemia.   c) Try to check blood glucose before sleeping/in the middle of the night to ensure that it is remaining stable and not dropping less than 100 d) Check blood glucose more often if sick  3. Diet: a) 3 meals per day schedule b: Restrict carbs to 60-70 grams (4 servings) per meal c) Colorful vegetables - 3 servings a day, and low sugar fruit 2 servings/day Plate control method: 1/4 plate protein, 1/4 starch, 1/2 green, yellow, or red vegetables d) Avoid carbohydrate snacks unless hypoglycemic episode, or increased physical activity  4. Regular exercise as tolerated, preferably 3 or more hours a week  5. Hypoglycemia: a)  Do not drive or operate machinery without first testing blood glucose to assure it is over 90 mg%, or if dizzy, lightheaded, not feeling normal, etc, or  if foot or leg is numb or weak. b)  If blood glucose less than 70, take four 5gm Glucose tabs or 15-30 gm Glucose gel.  Repeat every 15 min as needed until blood sugar is >100 mg/dl. If hypoglycemia persists then call 911.   6. Sick day management: a) Check blood glucose more often b) Continue usual therapy if blood sugars are elevated.   7. Contact the doctor immediately if blood glucose is frequently <60 mg/dl, or an episode of severe hypoglycemia occurs (where someone had to give you glucose/  glucagon  or if you passed out from a low blood glucose), or if blood glucose is persistently >350 mg/dl, for further management  8. A change in level of physical  activity or exercise and a change in diet may also affect your blood sugar. Check blood sugars more often and call if needed.  Instructions: 1. Bring glucose meter, blood glucose records on every visit for review 2. Continue to follow up with primary care physician and other providers for medical care 3. Yearly eye  and foot exam 4. Please get blood work done prior to the next appointment  Goals of DM therapy:  Morning Fasting blood sugar: 80-140  Blood sugar before meals: 80-140 Bed time blood sugar: 100-150  A1C <7%, limited only by hypoglycemia  1.Diabetes medications and their side effects discussed, including hypoglycemia    2. Check blood glucose:  a) Always check blood sugars before driving. Please see below (under hypoglycemia) on how to manage b) Check a minimum of 3 times/day or more as needed when having symptoms of hypoglycemia.   c) Try to check blood glucose before sleeping/in the middle of the night to ensure that it is remaining stable and not dropping less than 100 d) Check blood glucose more often if sick  3. Diet: a) 3 meals per day schedule b: Restrict carbs to 60-70 grams (4 servings) per meal c) Colorful vegetables - 3 servings a day, and low sugar fruit 2 servings/day Plate control method: 1/4 plate protein, 1/4 starch, 1/2 green, yellow, or red vegetables d) Avoid carbohydrate snacks unless hypoglycemic episode, or increased physical activity  4. Regular exercise as tolerated, preferably 3 or more hours a week  5. Hypoglycemia: a)  Do not drive or operate machinery without first testing blood glucose to assure it is over 90 mg%, or if dizzy, lightheaded, not feeling normal, etc, or  if foot or leg is numb or weak. b)  If blood glucose less than 70, take four 5gm Glucose tabs or 15-30 gm Glucose gel.  Repeat every 15 min as needed until blood sugar is >100 mg/dl. If hypoglycemia persists then call 911.   6. Sick day management: a) Check blood glucose more  often b) Continue usual therapy if blood sugars are elevated.   7. Contact the doctor immediately if blood glucose is frequently <60 mg/dl, or an episode of severe hypoglycemia occurs (where someone had to give you glucose/  glucagon  or if you passed out from a low blood glucose), or if blood glucose is persistently >350 mg/dl, for further management  8. A change in level of physical activity or exercise and a change in diet may also affect your blood sugar. Check blood sugars more often and call if needed.  Instructions: 1. Bring glucose meter, blood glucose records on every visit for review 2. Continue to follow up with primary care physician and other providers for medical care 3. Yearly eye  and foot exam 4. Please get blood work done prior to the next appointment

## 2023-11-26 NOTE — Progress Notes (Signed)
 Outpatient Endocrinology Note Bradley Birmingham, MD  11/26/23   [name] 1955/08/28 [id number]  Referring Provider: Jason Leita [name],* Primary Care Provider: Almarie Waddell NOVAK, NP Reason for consultation: Subjective   Assessment & Plan  Diagnoses and all orders for this visit:  Uncontrolled type 2 diabetes mellitus with hyperglycemia (HCC) -     POCT glycosylated hemoglobin (Hb A1C) -     Microalbumin / creatinine urine ratio -     Semaglutide , 2 MG/DOSE, (OZEMPIC , 2 MG/DOSE,) 8 MG/3ML SOPN; Inject 2 mg into the skin once a week.  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, MI  Lab Results  Component Value Date   GFR 37.43 (L) 11/10/2023   Hba1c goal less than 7, current Hba1c is 6.8% (04/29/23) Lab Results  Component Value Date   HGBA1C 6.3 (A) 11/26/2023   Will recommend the following: Farxiga  10 mg daily Ozempic  2 mg weekly  Glipizide  XL 2.5 mg qam-skip if blood sugar is less than 120 or skipping a meal  Stopped Lantus  15 units qam    On DexCom   No known contraindications to any of above medications Glucagon  explained and prescribed with refills 08/13/22  -Last LD and Tg are as follows: Lab Results  Component Value Date   LDLCALC 42 07/29/2023    Lab Results  Component Value Date   TRIG 82 07/29/2023   -Recommend atorvastatin  80 mg QD -Follow low fat diet and exercise   -Blood pressure goal <140/90 - Microalbumin/creatinine goal < 30 -Last MA/Cr is as follows: No results found for: MICROALBUR, [id number]  - on ACE/ARB lisinopril  20 mg qd -diet changes including salt restriction -limit eating outside -counseled BP targets per standards of diabetes care -uncontrolled blood pressure can lead to retinopathy, nephropathy and cardiovascular and atherosclerotic heart disease  Reviewed  and counseled on: -A1C target -Blood sugar targets -Complications of uncontrolled diabetes  -Checking blood sugar before meals and bedtime and bring log next visit -All medications with mechanism of action and side effects -Hypoglycemia management: rule of 15's, Glucagon  Emergency Kit and medical alert ID -low-carb low-fat plate-method diet -At least 20 minutes of physical activity per day -Annual dilated retinal eye exam and foot exam -compliance and follow up needs -follow up as scheduled or earlier if problem gets worse  Call if blood sugar is less than 70 or consistently above 250    Take a 15 gm snack of carbohydrate at bedtime before you go to sleep if your blood sugar is less than 100.    If you are going to fast after midnight for a test or procedure, ask your physician for instructions on how to reduce/decrease your insulin  dose.    Call if blood sugar is less than 70 or consistently above 250  -Treating a low sugar by rule of 15  (15 gms of sugar every 15 min until sugar is more than 70) If you feel your sugar is low, test your sugar to be sure If your sugar is low (less than 70), then take 15 grams of a fast acting Carbohydrate (3-4 glucose tablets or glucose gel or 4 ounces of juice or regular soda) Recheck your sugar 15 min after treating low to make sure it is more than 70 If sugar is still less than 70, treat again with 15 grams of carbohydrate  Don't drive the hour of hypoglycemia  If unconscious/unable to eat or drink by mouth, use glucagon  injection or nasal spray baqsimi  and call 911. Can repeat again in 15 min if still unconscious.  Return in about 4 months (around 03/26/2024) for visit, labs today.   I have reviewed current medications, nurse's notes, allergies, vital signs, past medical and surgical history, family medical history, and social history for this encounter. Counseled patient on symptoms, examination findings, lab findings, imaging results,  treatment decisions and monitoring and prognosis. The patient understood the recommendations and agrees with the treatment plan. All questions regarding treatment plan were fully answered.  Bradley Birmingham, MD  11/26/23    History of Present Illness [name] is a 68 y.o. year old male who presents for follow up of Type II diabetes mellitus.  [name] was first diagnosed in 2008.   Diabetes education +  Current regimen: Farxiga  10 mg daily Ozempic  2 mg weekly  Glipizide  XL 2.5 mg qam  Stopped Lantus  15 units qam     Previous history: Non-insulin  hypoglycemic drugs previously used: Farxiga , Actos , Ozempic  Insulin  was started in 2018 when his A1c was 15.6  COMPLICATIONS +  MI/ - stroke -  retinopathy +  neuropathy +  nephropathy  BLOOD SUGAR DATA CGM interpretation: At today's visit, we reviewed her CGM downloads. The full report is scanned in the media. Reviewing the CGM trends, BG are well-controlled across the day with some elevations in daytime.  Physical Exam  BP 104/80   Pulse 92   Ht 5' 5 (1.651 m)   Wt 163 lb (73.9 kg)   SpO2 96%   BMI 27.12 kg/m    Constitutional: well developed, well nourished [name]: normocephalic, atraumatic Eyes: sclera anicteric, no redness Neck: supple Lungs: normal respiratory effort Neurology: alert and oriented Skin: dry, no appreciable rashes Musculoskeletal: no appreciable defects Psychiatric: normal mood and affect Diabetic Foot Exam - Simple   No data filed      Current Medications Patient's Medications  New Prescriptions   No medications on file  Previous Medications   AMLODIPINE  (NORVASC ) 5 MG TABLET    Take 1 tablet (5 mg total) by mouth daily.   ASPIRIN  EC 81 MG EC TABLET    Take 1 tablet (81 mg total) by mouth daily.   ATORVASTATIN  (LIPITOR ) 80 MG TABLET    Take 1 tablet (80 mg total) by mouth daily.   CARVEDILOL  (COREG ) 12.5 MG TABLET    Take 1 tablet (12.5 mg total) by mouth 2 (two) times daily  with a meal.   CLOPIDOGREL  (PLAVIX ) 75 MG TABLET    Take 1 tablet (75 mg total) by mouth daily.   CONTINUOUS GLUCOSE RECEIVER (DEXCOM G7 RECEIVER) DEVI    1 Device by Does not apply route continuous.   CONTINUOUS GLUCOSE SENSOR (DEXCOM G7 SENSOR) MISC    1 Device by Does not apply route continuous.   FARXIGA  10 MG TABS TABLET    TAKE 1 TABLET BY MOUTH ONCE DAILY BEFORE BREAKFAST   GLIPIZIDE  (GLUCOTROL  XL) 2.5 MG 24 HR TABLET    Take 1 tablet (2.5 mg total) by mouth daily with breakfast.   GLUCAGON  (BAQSIMI  ONE PACK) 3 MG/DOSE POWD    Place 1 Device into the nose as needed (Low blood sugar with impaired consciousness).   HYDROCHLOROTHIAZIDE  (HYDRODIURIL ) 25 MG TABLET    Take 0.5 tablets (12.5 mg total) by mouth daily.   INSULIN  SYRINGE-NEEDLE U-100 30G X 1/2 1 ML  MISC    Use as directed   ISOSORBIDE  MONONITRATE (IMDUR ) 60 MG 24 HR TABLET    TAKE 1 & 1/2 (ONE & ONE-HALF) TABLETS BY MOUTH ONCE DAILY .   LANTUS  SOLOSTAR 100 UNIT/ML SOLOSTAR PEN    INJECT 15 UNITS SUBCUTANEOUSLY IN THE MORNING   LISINOPRIL  (ZESTRIL ) 20 MG TABLET    TAKE 1 TABLET BY MOUTH ONCE DAILY . APPOINTMENT REQUIRED FOR FUTURE REFILLS   MULTIPLE VITAMIN (MULTIVITAMIN WITH MINERALS) TABS TABLET    Take 1 tablet by mouth daily.  Modified Medications   Modified Medication Previous Medication   SEMAGLUTIDE , 2 MG/DOSE, (OZEMPIC , 2 MG/DOSE,) 8 MG/3ML SOPN OZEMPIC , 2 MG/DOSE, 8 MG/3ML SOPN      Inject 2 mg into the skin once a week.    INJECT 2 MG INTO THE SKIN ONCE A WEEK  Discontinued Medications   No medications on file    Allergies Allergies  Allergen Reactions   Ambien  [Zolpidem  Tartrate] Other (See Comments)    Patient stated this medication made him go crazy   Metformin  And Related Diarrhea    Past Medical History Past Medical History:  Diagnosis Date   Asthma    CAD (coronary artery disease)    a. non obst dz by cath 2004  b. 11/12/15 which showed no obvious large vessel CAD. No PCI or clear reason for NSTEMI (  possibly hypertensive urgency?).    CARDIOMYOPATHY    a. NICM, LVEF 30% 01/2011 echo; 20-25% 07/2012 echo, EF recovered by echo 06/2015.   Congestive heart failure (CHF) (HCC)    DIABETES MELLITUS, TYPE II    HYPERCHOLESTEROLEMIA    HYPERTENSION    Myocardial infarct (HCC) 10/2015   Noncompliance    Prostate cancer (HCC)    prostate cancer   PVCs (premature ventricular contractions)    a. s/p ablation 01-07-2013 by Dr Kelsie    Past Surgical History Past Surgical History:  Procedure Laterality Date   ABLATION  01-07-2013   RVOT PVC's ablated by Dr Kelsie along anteroseptal RVOT   CARDIAC CATHETERIZATION  2004   nonobst dz   CARDIAC CATHETERIZATION N/A 11/12/2015   Procedure: Left Heart Cath and Coronary Angiography;  Surgeon: Ozell Fell, MD;  Location: [facility] INVASIVE CV LAB;  Service: Cardiovascular;  Laterality: N/A;   CHOLECYSTECTOMY N/A 11/16/2013   Procedure: LAPAROSCOPIC CHOLECYSTECTOMY WITH INTRAOPERATIVE CHOLANGIOGRAM;  Surgeon: Morene Olives, MD;  Location: [facility] ORS;  Service: General;  Laterality: N/A;   LYMPHADENECTOMY Bilateral 02/22/2014   Procedure: PELVIC LYMPH NODE DISSECTION;  Surgeon: Ricardo Likens, MD;  Location: [facility];  Service: Urology;  Laterality: Bilateral;   PROSTATE SURGERY     ROBOT ASSISTED LAPAROSCOPIC RADICAL PROSTATECTOMY N/A 02/22/2014   Procedure: ROBOTIC ASSISTED LAPAROSCOPIC RADICAL PROSTATECTOMY WITH INDOCYANINE GREEN DYE AND OPEN UMBILICAL HERNIA REPAIR;  Surgeon: Ricardo Likens, MD;  Location: [facility];  Service: Urology;  Laterality: N/A;   SUPRAVENTRICULAR TACHYCARDIA ABLATION N/A 09/28/2012   Procedure: Lore Ablation;  Surgeon: Lynwood Kelsie, MD;  Location: [facility] CATH LAB;  Service: Cardiovascular;  Laterality: N/A;   SURGERY SCROTAL / TESTICULAR     at age 53   V-TACH ABLATION N/A 01/07/2013   Procedure: V-TACH ABLATION;  Surgeon: Lynwood JONETTA Kelsie, MD;  Location: [facility] CATH LAB;  Service: Cardiovascular;  Laterality: N/A;    Family History family  history includes Colon cancer in his sister; Diabetes in his mother and son; Heart attack in his paternal grandfather; Heart failure in his maternal grandmother; Hypertension in his mother and another  family member; Lung cancer in his brother; Pancreatic cancer in his paternal aunt; Peripheral vascular disease in his mother and another family member; Prostate cancer in his brother, brother, brother, brother and another family member.  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  Vaping Use   Vaping status: Never Used  Substance and Sexual Activity   Alcohol use: Yes    Comment: rare   Drug use: No   Sexual activity: Never  Other Topics Concern   Not on file  Social History Narrative   Single - divorced x 3 -   Lives with 2 sons, enjoys spending time with 6 g-kids and 3 kids   Social Drivers of Corporate Investment Banker Strain: Not on file  Food Insecurity: Not on file  Transportation Needs: Not on file  Physical Activity: Not on file  Stress: Not on file  Social Connections: Unknown (03/25/2022)   Received from [facility]   Social Network    Social Network: Not on file  Intimate Partner Violence: Unknown (03/25/2022)   Received from [facility]   HITS    Physically Hurt: Not on file    Insult or Talk Down To: Not on file    Threaten Physical Harm: Not on file    Scream or Curse: Not on file    Lab Results  Component Value Date   HGBA1C 6.3 (A) 11/26/2023   HGBA1C 7.0 (A) 06/29/2023   HGBA1C 6.4 (A) 12/16/2022   Lab Results  Component Value Date   CHOL 98  07/29/2023   Lab Results  Component Value Date   HDL 40 07/29/2023   Lab Results  Component Value Date   LDLCALC 42 07/29/2023   Lab Results  Component Value Date   TRIG 82 07/29/2023   Lab Results  Component Value Date   CHOLHDL 2.5 07/29/2023   Lab Results  Component Value Date   CREATININE 1.82 (H) 11/10/2023   Lab Results  Component Value Date   GFR 37.43 (L) 11/10/2023   No results found for: MACKEY CURRENT     Component Value Date/Time   NA 141 11/10/2023 1043   NA 142 08/05/2021 1122   K 4.1 11/10/2023 1043   CL 107 11/10/2023 1043   CO2 25 11/10/2023 1043   GLUCOSE 123 (H) 11/10/2023 1043   BUN 18 11/10/2023 1043   BUN 17 08/05/2021 1122   CREATININE 1.82 (H) 11/10/2023 1043   CREATININE 1.49 (H) 07/29/2023 0813   CALCIUM  9.6 11/10/2023 1043   PROT 7.3 11/10/2023 1043   ALBUMIN 4.2 11/10/2023 1043   AST 21 11/10/2023 1043   ALT 19 11/10/2023 1043   ALKPHOS 101 11/10/2023 1043   BILITOT 0.6 11/10/2023 1043   GFRNONAA 56 (L) 10/22/2023 1845   GFRAA 37 (L) 06/24/2019 0631      Latest Ref Rng & Units 11/10/2023   10:43 AM 10/22/2023    6:45 PM 07/29/2023    8:13 AM  BMP  Glucose 70 - 99 mg/dL 876  86  123   BUN 6 - 23 mg/dL 18  16  16    Creatinine 0.40 - 1.50 mg/dL 8.17  8.62  8.50   BUN/Creat Ratio 6 - 22 (calc)   11   Sodium 135 - 145 mEq/L 141  140  139   Potassium 3.5 - 5.1 mEq/L 4.1  3.5  4.1   Chloride 96 - 112 mEq/L 107  105  106   CO2 19 - 32 mEq/L 25  21  27    Calcium  8.4 - 10.5 mg/dL 9.6  9.7  9.9        Component Value Date/Time   WBC 8.7 10/22/2023 1845   RBC 5.10 10/22/2023 1845   HGB 14.7 10/22/2023 1845   HGB 13.5 08/05/2021 1122   HCT 45.8 10/22/2023 1845   HCT 40.7 08/05/2021 1122   PLT 292 10/22/2023 1845   PLT 373 08/05/2021 1122   MCV 89.8 10/22/2023 1845   MCV 90 08/05/2021 1122   MCH 28.8 10/22/2023 1845   MCHC 32.1 10/22/2023 1845   RDW 12.6 10/22/2023 1845   RDW 12.4 08/05/2021 1122   LYMPHSABS 2.5  07/26/2021 1033   MONOABS 0.8 07/26/2021 1033   EOSABS 0.1 07/26/2021 1033   BASOSABS 0.1 07/26/2021 1033     Parts of this note may have been dictated using voice recognition software. There may be variances in spelling and vocabulary which are unintentional. Not all errors are proofread. Please notify the dino if any discrepancies are noted or if the meaning of any statement is not clear.

## 2023-11-27 ENCOUNTER — Encounter: Payer: Self-pay | Admitting: "Endocrinology

## 2023-11-27 LAB — MICROALBUMIN / CREATININE URINE RATIO
Creatinine, Urine: 118 mg/dL (ref 20–320)
Microalb Creat Ratio: 9 mg/g{creat} (ref <30)
Microalb, Ur: 1.1 mg/dL

## 2023-12-07 ENCOUNTER — Other Ambulatory Visit (INDEPENDENT_AMBULATORY_CARE_PROVIDER_SITE_OTHER)

## 2023-12-07 DIAGNOSIS — R944 Abnormal results of kidney function studies: Secondary | ICD-10-CM | POA: Diagnosis not present

## 2023-12-08 ENCOUNTER — Other Ambulatory Visit

## 2023-12-08 LAB — BASIC METABOLIC PANEL WITH GFR
BUN: 18 mg/dL (ref 6–23)
CO2: 27 meq/L (ref 19–32)
Calcium: 9.3 mg/dL (ref 8.4–10.5)
Chloride: 103 meq/L (ref 96–112)
Creatinine, Ser: 1.47 mg/dL (ref 0.40–1.50)
GFR: 48.33 mL/min — ABNORMAL LOW (ref >60.00)
Glucose, Bld: 98 mg/dL (ref 70–99)
Potassium: 3.9 meq/L (ref 3.5–5.1)
Sodium: 139 meq/L (ref 135–145)

## 2023-12-09 ENCOUNTER — Ambulatory Visit: Payer: Self-pay | Admitting: Family Medicine

## 2023-12-10 ENCOUNTER — Other Ambulatory Visit: Payer: Self-pay

## 2023-12-10 MED ORDER — AMLODIPINE BESYLATE 5 MG PO TABS: 5.0000 mg | ORAL_TABLET | Freq: Every day | ORAL | 1 refills | Status: AC

## 2023-12-14 ENCOUNTER — Ambulatory Visit: Admitting: Urology

## 2023-12-14 ENCOUNTER — Encounter: Payer: Self-pay | Admitting: Urology

## 2023-12-14 VITALS — BP 127/87 | HR 93 | Ht 65.0 in | Wt 166.0 lb

## 2023-12-14 DIAGNOSIS — N5231 Erectile dysfunction following radical prostatectomy: Secondary | ICD-10-CM | POA: Diagnosis not present

## 2023-12-14 DIAGNOSIS — C61 Malignant neoplasm of prostate: Secondary | ICD-10-CM | POA: Diagnosis not present

## 2023-12-14 LAB — URINALYSIS, ROUTINE W REFLEX MICROSCOPIC
Bilirubin, UA: NEGATIVE
Leukocytes,UA: NEGATIVE
Nitrite, UA: NEGATIVE
Protein,UA: NEGATIVE
RBC, UA: NEGATIVE
Specific Gravity, UA: 1.02 (ref 1.005–1.030)
Urobilinogen, Ur: 0.2 mg/dL (ref 0.2–1.0)
pH, UA: 5 (ref 5.0–7.5)

## 2023-12-14 NOTE — Progress Notes (Signed)
 Assessment: 1. Prostate cancer (HCC); pT2cN1Mx, Gleason 7; s/p RALP 2/16   2. Erectile dysfunction after radical prostatectomy     Plan: Continue TriMix 10/30/1 0.2 ml prn as directed. PSA today Return to office in 6 months.  Chief Complaint:  Chief Complaint  Patient presents with   Erectile Dysfunction    History of Present Illness:  [name] is a 68 y.o. male who is seen for further evaluation of erectile dysfunction and prostate cancer. He has a history of prostate cancer.  He underwent a robotic radical prostatectomy with bilateral pelvic lymphadenectomy in February 2016 for pT2cN1Mx Gleason 7 adenocarcinoma with focal positive right lateral margin and left obturator positive node.   Preoperative PSA was 14.8. Postoperative PSA: 6/16 <0.01 9/16 0.01 7/20 0.04 6/23 0.07 7/25 0.1  He did not undergo any additional treatment for his prostate cancer following the prostatectomy. He reported no lower urinary tract symptoms.  No problems with urinary incontinence.  He has been unable to achieve an erection since his prostatectomy in 2016.  He has some sensation but does not have any tumescence.  He is not a candidate for PDE 5 inhibitors due to his use of isosorbide  mononitrate.   He was instructed on penile injections with Trimix 10/30/1 in August 2025.  He returns today for follow-up.  He has not tried the Trimix since his last visit. No new lower urinary tract symptoms.  Portions of the above documentation were copied from a prior visit for review purposes only.   Past Medical History:  Past Medical History:  Diagnosis Date   Asthma    CAD (coronary artery disease)    a. non obst dz by cath 2004  b. 11/12/15 which showed no obvious large vessel CAD. No PCI or clear reason for NSTEMI ( possibly hypertensive urgency?).    CARDIOMYOPATHY    a. NICM, LVEF 30% 01/2011 echo; 20-25% 07/2012 echo, EF recovered by echo 06/2015.   Congestive heart failure (CHF) (HCC)     DIABETES MELLITUS, TYPE II    HYPERCHOLESTEROLEMIA    HYPERTENSION    Myocardial infarct (HCC) 10/2015   Noncompliance    Prostate cancer (HCC)    prostate cancer   PVCs (premature ventricular contractions)    a. s/p ablation 01-07-2013 by Dr Kelsie    Past Surgical History:  Past Surgical History:  Procedure Laterality Date   ABLATION  01-07-2013   RVOT PVC's ablated by Dr Kelsie along anteroseptal RVOT   CARDIAC CATHETERIZATION  2004   nonobst dz   CARDIAC CATHETERIZATION N/A 11/12/2015   Procedure: Left Heart Cath and Coronary Angiography;  Surgeon: Ozell Fell, MD;  Location: [facility] INVASIVE CV LAB;  Service: Cardiovascular;  Laterality: N/A;   CHOLECYSTECTOMY N/A 11/16/2013   Procedure: LAPAROSCOPIC CHOLECYSTECTOMY WITH INTRAOPERATIVE CHOLANGIOGRAM;  Surgeon: Morene Olives, MD;  Location: [facility];  Service: General;  Laterality: N/A;   LYMPHADENECTOMY Bilateral 02/22/2014   Procedure: PELVIC LYMPH NODE DISSECTION;  Surgeon: Ricardo Likens, MD;  Location: [facility];  Service: Urology;  Laterality: Bilateral;   PROSTATE SURGERY     ROBOT ASSISTED LAPAROSCOPIC RADICAL PROSTATECTOMY N/A 02/22/2014   Procedure: ROBOTIC ASSISTED LAPAROSCOPIC RADICAL PROSTATECTOMY WITH INDOCYANINE GREEN DYE AND OPEN UMBILICAL HERNIA REPAIR;  Surgeon: Ricardo Likens, MD;  Location: [facility];  Service: Urology;  Laterality: N/A;   SUPRAVENTRICULAR TACHYCARDIA ABLATION N/A 09/28/2012   Procedure: Lore Ablation;  Surgeon: Lynwood Kelsie, MD;  Location[facility] C[facility] LAB;  Service: Cardiovascular;  Laterality: N/A;   SURGERY  SCROTAL / TESTICULAR     at age 64   V-TACH ABLATION N/A 01/07/2013   Procedure: V-TACH ABLATION;  Surgeon: Lynwood JONETTA Rakers, MD;  Location: [facility] CATH LAB;  Service: Cardiovascular;  Laterality: N/A;    Allergies:  Allergies  Allergen Reactions   Ambien  [Zolpidem  Tartrate] Other (See Comments)    Patient stated this medication made him go crazy   Metformin  And Related Diarrhea    Family  History:  Family History  Problem Relation Age of Onset   Hypertension Mother    Peripheral vascular disease Mother    Diabetes Mother    Colon cancer Sister    Prostate cancer Brother    Lung cancer Brother    Prostate cancer Brother    Prostate cancer Brother    Prostate cancer Brother    Pancreatic cancer Paternal Aunt    Heart failure Maternal Grandmother    Heart attack Paternal Grandfather    Diabetes Son    Peripheral vascular disease Other    Hypertension Other    Prostate cancer Other    Esophageal cancer Neg Hx    Rectal cancer Neg Hx    Stomach cancer Neg Hx    Sleep apnea Neg Hx     Social History:  Social History   Tobacco Use   Smoking status: Never   Smokeless tobacco: Never   Tobacco comments:    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   Vaping status: Never Used  Substance Use Topics   Alcohol use: Yes    Comment: rare   Drug use: No    ROS: Constitutional:  Negative for fever, chills, weight loss CV: Negative for chest pain, previous MI, hypertension Respiratory:  Negative for shortness of breath, wheezing, sleep apnea, frequent cough GI:  Negative for nausea, vomiting, bloody stool, GERD  Physical exam: BP 127/87   Pulse 93   Ht 5' 5 (1.651 m)   Wt 166 lb (75.3 kg)   BMI 27.62 kg/m  GENERAL APPEARANCE:  Well appearing, well developed, well nourished, NAD HEENT:  Atraumatic, normocephalic, oropharynx clear NECK:  Supple without lymphadenopathy or thyromegaly ABDOMEN:  Soft, non-tender, no masses EXTREMITIES:  Moves all extremities well, without clubbing, cyanosis, or edema NEUROLOGIC:  Alert and oriented x 3, normal gait, CN II-XII grossly intact MENTAL STATUS:  appropriate BACK:  Non-tender to palpation, No CVAT SKIN:  Warm, dry, and intact   Results: U/A: negative

## 2023-12-15 ENCOUNTER — Ambulatory Visit: Payer: Self-pay | Admitting: Urology

## 2023-12-15 LAB — PSA: Prostate Specific Ag, Serum: 0.1 ng/mL (ref 0.0–4.0)

## 2023-12-15 NOTE — Telephone Encounter (Signed)
Spoke with pt in reference to PSA results and f/u. Pt voiced understanding.  

## 2023-12-15 NOTE — Telephone Encounter (Signed)
-----   Message from [facility] sent at 12/15/2023  8:22 AM EST ----- PSA stable status post RALP 2016. Please notify patient of PSA results. Recommend follow-up in 6 months as scheduled. ----- Message ----- From: Rebecka Memos Lab Results In Sent: 12/14/2023  12:36 PM EST To: Adine DOROTHA Manly, MD

## 2023-12-17 ENCOUNTER — Other Ambulatory Visit: Payer: Self-pay

## 2023-12-17 MED ORDER — HYDROCHLOROTHIAZIDE 25 MG PO TABS: 12.5000 mg | ORAL_TABLET | Freq: Every day | ORAL | 1 refills | Status: AC

## 2023-12-25 ENCOUNTER — Other Ambulatory Visit: Payer: Self-pay | Admitting: Cardiology

## 2023-12-25 ENCOUNTER — Other Ambulatory Visit: Payer: Self-pay | Admitting: "Endocrinology

## 2023-12-28 NOTE — Telephone Encounter (Signed)
 Requested Prescriptions   Pending Prescriptions Disp Refills   glipiZIDE  (GLUCOTROL  XL) 2.5 MG 24 hr tablet [Pharmacy Med Name: glipiZIDE  ER 2.5 MG Oral Tablet Extended Release 24 Hour] 90 tablet 0    Sig: Take 1 tablet by mouth once daily with breakfast

## 2024-01-25 ENCOUNTER — Other Ambulatory Visit: Payer: Self-pay | Admitting: "Endocrinology

## 2024-02-09 ENCOUNTER — Telehealth: Payer: Self-pay | Admitting: Cardiology

## 2024-02-09 NOTE — Telephone Encounter (Signed)
" °*  STAT* If patient is at the pharmacy, call can be transferred to refill team.   1. Which medications need to be refilled? (please list name of each medication and dose if known)   isosorbide  mononitrate (IMDUR ) 60 MG 24 hr tablet     2. Would you like to learn more about the convenience, safety, & potential cost savings by using the [facility]? No   3. Are you open to using the [facility] (Type [facility]. ). No    4. Which pharmacy/location (including street and city if local pharmacy) is medication to be sent to?[facility] - [place], [place] - [address]    5. Do they need a 30 day or 90 day supply? 30  Pt is out and has appointment on 03/18/23  "

## 2024-02-11 MED ORDER — ISOSORBIDE MONONITRATE ER 60 MG PO TB24: ORAL_TABLET | ORAL | 0 refills | Status: AC

## 2024-02-11 NOTE — Telephone Encounter (Signed)
 Pt scheduled to see Dr. Jeffrie 03/17/24, refill sent.

## 2024-02-24 ENCOUNTER — Ambulatory Visit

## 2024-02-24 VITALS — BP 127/87 | Ht 65.0 in | Wt 166.0 lb

## 2024-02-24 DIAGNOSIS — Z0000 Encounter for general adult medical examination without abnormal findings: Secondary | ICD-10-CM | POA: Diagnosis not present

## 2024-02-24 NOTE — Patient Instructions (Signed)
 [name],  Thank you for taking the time for your Medicare Wellness Visit. I appreciate your continued commitment to your health goals. Please review the care plan we discussed, and feel free to reach out if I can assist you further.  Please note that Annual Wellness Visits do not include a physical exam. Some assessments may be limited, especially if the visit was conducted virtually. If needed, we may recommend an in-person follow-up with your provider.  Ongoing Care Seeing your primary care provider every 3 to 6 months helps us  monitor your health and provide consistent, personalized care.   Referrals If a referral was made during today's visit and you haven't received any updates within two weeks, please contact the referred provider directly to check on the status.  Recommended Screenings:  Health Maintenance  Topic Date Due   Medicare Annual Wellness Visit  Never done   Eye exam for diabetics  01/14/2024   Kidney health urinalysis for diabetes  05/26/2024   Hemoglobin A1C  05/26/2024   Complete foot exam   06/28/2024   Yearly kidney function blood test for diabetes  12/06/2024   Colon Cancer Screening  07/13/2025   DTaP/Tdap/Td vaccine (3 - Td or Tdap) 03/12/2030   Pneumococcal Vaccine for age over 64  Completed   Flu Shot  Completed   Hepatitis C Screening  Completed   Zoster (Shingles) Vaccine  Completed   Meningitis B Vaccine  Aged Out   COVID-19 Vaccine  Discontinued       10/22/2023    3:39 PM  Advanced Directives  Does Patient Have a Medical Advance Directive? No  Would patient like information on creating a medical advance directive? No - Patient declined    Vision: Annual vision screenings are recommended for early detection of glaucoma, cataracts, and diabetic retinopathy. These exams can also reveal signs of chronic conditions such as diabetes and high blood pressure.  Dental: Annual dental screenings help detect early signs of oral cancer, gum disease, and  other conditions linked to overall health, including heart disease and diabetes.  Please see the attached documents for additional preventive care recommendations.

## 2024-02-24 NOTE — Progress Notes (Signed)
 " I connected with  [name] on 02/24/24 by a audio enabled telemedicine application and verified that I am speaking with the correct person using two identifiers.  Patient Location: Home  Provider Location: Home Office  Persons Participating in Visit: Patient.  I discussed the limitations of evaluation and management by telemedicine. The patient expressed understanding and agreed to proceed.  Vital Signs: Because this visit was a virtual/telehealth visit, some criteria may be missing or patient reported. Any vitals not documented were not able to be obtained and vitals that have been documented are patient reported.  Chief Complaint  Patient presents with   Medicare Wellness     Subjective:   [name] is a 69 y.o. male who presents for a Medicare Annual Wellness Visit.  Visit info / Clinical Intake: Medicare Wellness Visit Type:: Initial Annual Wellness Visit Persons participating in visit and providing information:: patient Medicare Wellness Visit Mode:: Telephone If telephone:: video declined Since this visit was completed virtually, some vitals may be partially provided or unavailable. Missing vitals are due to the limitations of the virtual format.: Documented vitals are patient reported If Telephone or Video please confirm:: I connected with patient using audio/video enable telemedicine. I verified patient identity with two identifiers, discussed telehealth limitations, and patient agreed to proceed. Patient Location:: home Provider Location:: home office Interpreter Needed?: No Pre-visit prep was completed: yes AWV questionnaire completed by patient prior to visit?: no Patient's Overall Health Status Rating: very good Typical amount of pain: none Does pain affect daily life?: no Are you currently prescribed opioids?: no  Dietary Habits and Nutritional Risks How many meals a day?: 3 Eats fruit and vegetables daily?: yes Most meals are obtained by:  preparing own meals In the last 2 weeks, have you had any of the following?: none Diabetic:: (!) yes Any non-healing wounds?: no How often do you check your BS?: 0 Would you like to be referred to a Nutritionist or for Diabetic Management? : no  Functional Status Activities of Daily Living (to include ambulation/medication): Independent Ambulation: Independent Medication Administration: Independent Home Management (perform basic housework or laundry): Independent Manage your own finances?: yes Primary transportation is: driving Concerns about vision?: no *vision screening is required for WTM* Concerns about hearing?: no  Fall Screening Falls in the past year?: 0 Number of falls in past year: 0 Was there an injury with Fall?: 0 Fall Risk Category Calculator: 0 Patient Fall Risk Level: Low Fall Risk  Fall Risk Patient at Risk for Falls Due to: No Fall Risks Fall risk Follow up: Falls evaluation completed; Falls prevention discussed  Home and Transportation Safety: All rugs have non-skid backing?: N/A, no rugs All stairs or steps have railings?: N/A, no stairs Grab bars in the bathtub or shower?: yes Have non-skid surface in bathtub or shower?: yes  Advance Directives (For Healthcare) Does Patient Have a Medical Advance Directive?: No Would patient like information on creating a medical advance directive?: No - Patient declined  Reviewed/Updated  Reviewed/Updated: Reviewed All (Medical, Surgical, Family, Medications, Allergies, Care Teams, Patient Goals)    Allergies (verified) Ambien  [zolpidem  tartrate] and Metformin  and related   Current Medications (verified) Outpatient Encounter Medications as of 02/24/2024  Medication Sig   amLODipine  (NORVASC ) 5 MG tablet Take 1 tablet (5 mg total) by mouth daily.   atorvastatin  (LIPITOR ) 80 MG tablet Take 1 tablet (80 mg total) by mouth daily.   carvedilol  (COREG ) 12.5 MG tablet Take 1 tablet (12.5 mg total) by mouth  2 (two)  times daily with a meal.   clopidogrel  (PLAVIX ) 75 MG tablet Take 1 tablet (75 mg total) by mouth daily.   Continuous Glucose Receiver (DEXCOM G7 RECEIVER) DEVI 1 Device by Does not apply route continuous.   Continuous Glucose Sensor (DEXCOM G7 SENSOR) MISC 1 Device by Does not apply route continuous.   FARXIGA  10 MG TABS tablet TAKE 1 TABLET BY MOUTH ONCE DAILY BEFORE BREAKFAST   glipiZIDE  (GLUCOTROL  XL) 2.5 MG 24 hr tablet Take 1 tablet by mouth once daily with breakfast   Glucagon  (BAQSIMI  ONE PACK) 3 MG/DOSE POWD Place 1 Device into the nose as needed (Low blood sugar with impaired consciousness).   hydrochlorothiazide  (HYDRODIURIL ) 25 MG tablet Take 0.5 tablets (12.5 mg total) by mouth daily.   Insulin  Syringe-Needle U-100 30G X 1/2 1 ML MISC Use as directed   isosorbide  mononitrate (IMDUR ) 60 MG 24 hr tablet TAKE 1 & 1/2 (ONE & ONE-HALF) TABLETS BY MOUTH ONCE DAILY . Must make follow up appt for further refills.   lisinopril  (ZESTRIL ) 20 MG tablet TAKE 1 TABLET BY MOUTH ONCE DAILY . APPOINTMENT REQUIRED FOR FUTURE REFILLS   Multiple Vitamin (MULTIVITAMIN WITH MINERALS) TABS tablet Take 1 tablet by mouth daily.   Semaglutide , 2 MG/DOSE, (OZEMPIC , 2 MG/DOSE,) 8 MG/3ML SOPN Inject 2 mg into the skin once a week.   aspirin  EC 81 MG EC tablet Take 1 tablet (81 mg total) by mouth daily. (Patient not taking: Reported on 02/24/2024)   LANTUS  SOLOSTAR 100 UNIT/ML Solostar Pen INJECT 15 UNITS SUBCUTANEOUSLY IN THE MORNING (Patient not taking: Reported on 02/24/2024)   No facility-administered encounter medications on file as of 02/24/2024.    History: Past Medical History:  Diagnosis Date   Asthma    CAD (coronary artery disease)    a. non obst dz by cath 2004  b. 11/12/15 which showed no obvious large vessel CAD. No PCI or clear reason for NSTEMI ( possibly hypertensive urgency?).    CARDIOMYOPATHY    a. NICM, LVEF 30% 01/2011 echo; 20-25% 07/2012 echo, EF recovered by echo 06/2015.   Congestive  heart failure (CHF) (HCC)    DIABETES MELLITUS, TYPE II    HYPERCHOLESTEROLEMIA    HYPERTENSION    Myocardial infarct (HCC) 10/2015   Noncompliance    Prostate cancer (HCC)    prostate cancer   PVCs (premature ventricular contractions)    a. s/p ablation 01-07-2013 by Dr Kelsie   Past Surgical History:  Procedure Laterality Date   ABLATION  01-07-2013   RVOT PVC's ablated by Dr Kelsie along anteroseptal RVOT   CARDIAC CATHETERIZATION  2004   nonobst dz   CARDIAC CATHETERIZATION N/A 11/12/2015   Procedure: Left Heart Cath and Coronary Angiography;  Surgeon: Ozell Fell, MD;  Location: [facility] INVASIVE CV LAB;  Service: Cardiovascular;  Laterality: N/A;   CHOLECYSTECTOMY N/A 11/16/2013   Procedure: LAPAROSCOPIC CHOLECYSTECTOMY WITH INTRAOPERATIVE CHOLANGIOGRAM;  Surgeon: Morene Olives, MD;  Location: [facility];  Service: General;  Laterality: N/A;   LYMPHADENECTOMY Bilateral 02/22/2014   Procedure: PELVIC LYMPH NODE DISSECTION;  Surgeon: Ricardo Likens, MD;  Location: [facility];  Service: Urology;  Laterality: Bilateral;   PROSTATE SURGERY     ROBOT ASSISTED LAPAROSCOPIC RADICAL PROSTATECTOMY N/A 02/22/2014   Procedure: ROBOTIC ASSISTED LAPAROSCOPIC RADICAL PROSTATECTOMY WITH INDOCYANINE GREEN DYE AND OPEN UMBILICAL HERNIA REPAIR;  Surgeon: Ricardo Likens, MD;  Location: [facility];  Service: Urology;  Laterality: N/A;   SUPRAVENTRICULAR TACHYCARDIA ABLATION N/A 09/28/2012   Procedure: VTach Ablation;  Surgeon: Lynwood Rakers, MD;  Location: [facility] CATH LAB;  Service: Cardiovascular;  Laterality: N/A;   SURGERY SCROTAL / TESTICULAR     at age 78   V-TACH ABLATION N/A 01/07/2013   Procedure: V-TACH ABLATION;  Surgeon: Lynwood JONETTA Rakers, MD;  Location: [facility] CATH LAB;  Service: Cardiovascular;  Laterality: N/A;   Family History  Problem Relation Age of Onset   Hypertension Mother    Peripheral vascular disease Mother    Diabetes Mother    Colon cancer Sister    Prostate cancer Brother    Lung cancer  Brother    Prostate cancer Brother    Prostate cancer Brother    Prostate cancer Brother    Pancreatic cancer Paternal Aunt    Heart failure Maternal Grandmother    Heart attack Paternal Grandfather    Diabetes Son    Peripheral vascular disease Other    Hypertension Other    Prostate cancer Other    Esophageal cancer Neg Hx    Rectal cancer Neg Hx    Stomach cancer Neg Hx    Sleep apnea Neg Hx    Social History   Occupational History   Occupation: Lawyer: [employer]  Tobacco Use   Smoking status: Never   Smokeless tobacco: Never   Tobacco comments:    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  Vaping Use   Vaping status: Never Used  Substance and Sexual Activity   Alcohol use: Yes    Comment: rare   Drug use: No   Sexual activity: Never   Tobacco Counseling Counseling given: Not Answered Tobacco comments: 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  SDOH Screenings   Food Insecurity: No Food Insecurity (02/24/2024)  Housing: Low Risk (02/24/2024)  Transportation Needs: No Transportation Needs (02/24/2024)  Utilities: Not At Risk (02/24/2024)  Depression (PHQ2-9): Low Risk (02/24/2024)  Physical Activity: Patient Declined (02/24/2024)  Social Connections: Socially Integrated (02/24/2024)  Stress: No Stress Concern Present (02/24/2024)  Tobacco Use: Low Risk (02/24/2024)  Health Literacy: Adequate Health Literacy (02/24/2024)   See flowsheets for full screening details  Depression Screen PHQ 2 & 9 Depression Scale- Over the past 2 weeks, how often have you been bothered by any of the following problems? Little interest or pleasure in doing things: 0 Feeling down, depressed, or hopeless (PHQ Adolescent also includes...irritable): 0 PHQ-2 Total Score: 0 Trouble falling or staying asleep, or sleeping too much: 1 (trouble staying asleep) Feeling tired or having little energy: 0 Poor appetite or overeating (PHQ  Adolescent also includes...weight loss): 0 Feeling bad about yourself - or that you are a failure or have let yourself or your family down: 0 Trouble concentrating on things, such as reading the newspaper or watching television (PHQ Adolescent also includes...like school work): 0 Moving or speaking so slowly that other people could have noticed. Or the opposite - being so fidgety or restless that you have been moving around a lot more than usual: 0 Thoughts that you would be better off dead, or of hurting yourself in some way: 0 PHQ-9 Total Score: 1 If you checked off any problems, how difficult have these problems made it for you to do your work, take care of things at home, or get along with other people?: Not difficult at all  Depression Treatment Depression Interventions/Treatment : EYV7-0 Score <4 Follow-up Not Indicated     Goals Addressed  This Visit's Progress     retire (pt-stated)               Objective:    Today's Vitals   02/24/24 1019  BP: 127/87  Weight: 166 lb (75.3 kg)  Height: 5' 5 (1.651 m)   Body mass index is 27.62 kg/m.  Hearing/Vision screen Hearing Screening - Comments:: No difficulties  Vision Screening - Comments:: Patient wears glasses.  Immunizations and Health Maintenance Health Maintenance  Topic Date Due   Medicare Annual Wellness (AWV)  Never done   OPHTHALMOLOGY EXAM  01/14/2024   Diabetic kidney evaluation - Urine ACR  05/26/2024   HEMOGLOBIN A1C  05/26/2024   FOOT EXAM  06/28/2024   Diabetic kidney evaluation - eGFR measurement  12/06/2024   Colonoscopy  07/13/2025   DTaP/Tdap/Td (3 - Td or Tdap) 03/12/2030   Pneumococcal Vaccine: 50+ Years  Completed   Influenza Vaccine  Completed   Hepatitis C Screening  Completed   Zoster Vaccines- Shingrix  Completed   Meningococcal B Vaccine  Aged Out   COVID-19 Vaccine  Discontinued        Assessment/Plan:  This is a routine wellness examination for  [name].  Patient Care Team: Almarie Waddell NOVAK, NP as PCP - General (Family Medicine) Jeffrie Oneil BROCKS, MD as PCP - Cardiology (Cardiology) Lavona Agent, MD (Cardiology) Clance, Francis HERO, MD as Consulting Physician (Pulmonary Disease) Bensimhon, Toribio SAUNDERS, MD (Cardiology)  I have personally reviewed and noted the following in the patients chart:   Medical and social history Use of alcohol, tobacco or illicit drugs  Current medications and supplements including opioid prescriptions. Functional ability and status Nutritional status Physical activity Advanced directives List of other physicians Hospitalizations, surgeries, and ER visits in previous 12 months Vitals Screenings to include cognitive, depression, and falls Referrals and appointments  No orders of the defined types were placed in this encounter.  In addition, I have reviewed and discussed with patient certain preventive protocols, quality metrics, and best practice recommendations. A written personalized care plan for preventive services as well as general preventive health recommendations were provided to patient.   [name] Right, [place]   02/24/2024   No follow-ups on file.  After Visit Summary: (MyChart) Due to this being a telephonic visit, the after visit summary with patients personalized plan was offered to patient via MyChart   Nurse Notes: no voices concerns  "

## 2024-03-17 ENCOUNTER — Ambulatory Visit: Admitting: Cardiology

## 2024-03-28 ENCOUNTER — Ambulatory Visit: Admitting: "Endocrinology

## 2024-06-14 ENCOUNTER — Ambulatory Visit: Admitting: Urology
